# Patient Record
Sex: Female | Born: 1990 | Race: White | Hispanic: No | Marital: Single | Smoking: Current every day smoker
Health system: Southern US, Community
[De-identification: ages and names within clinical notes are randomized; demographics above are authoritative.]

## PROBLEM LIST (undated history)

## (undated) ENCOUNTER — Inpatient Hospital Stay (HOSPITAL_COMMUNITY): Payer: Self-pay

## (undated) ENCOUNTER — Encounter

## (undated) ENCOUNTER — Ambulatory Visit: Payer: MEDICAID

## (undated) ENCOUNTER — Telehealth

## (undated) ENCOUNTER — Ambulatory Visit

## (undated) ENCOUNTER — Ambulatory Visit: Payer: MEDICAID | Attending: Hematology | Primary: Hematology

## (undated) ENCOUNTER — Encounter: Attending: Hematology | Primary: Hematology

## (undated) ENCOUNTER — Encounter: Attending: Gastroenterology | Primary: Gastroenterology

## (undated) DIAGNOSIS — Z91199 Patient's noncompliance with other medical treatment and regimen due to unspecified reason: Secondary | ICD-10-CM

## (undated) DIAGNOSIS — K6389 Other specified diseases of intestine: Secondary | ICD-10-CM

## (undated) DIAGNOSIS — N3010 Interstitial cystitis (chronic) without hematuria: Secondary | ICD-10-CM

## (undated) DIAGNOSIS — F329 Major depressive disorder, single episode, unspecified: Secondary | ICD-10-CM

## (undated) DIAGNOSIS — D6859 Other primary thrombophilia: Secondary | ICD-10-CM

## (undated) DIAGNOSIS — G08 Intracranial and intraspinal phlebitis and thrombophlebitis: Secondary | ICD-10-CM

## (undated) DIAGNOSIS — I639 Cerebral infarction, unspecified: Secondary | ICD-10-CM

## (undated) DIAGNOSIS — O1490 Unspecified pre-eclampsia, unspecified trimester: Secondary | ICD-10-CM

## (undated) DIAGNOSIS — R569 Unspecified convulsions: Secondary | ICD-10-CM

## (undated) DIAGNOSIS — F1120 Opioid dependence, uncomplicated: Secondary | ICD-10-CM

## (undated) DIAGNOSIS — I2699 Other pulmonary embolism without acute cor pulmonale: Secondary | ICD-10-CM

## (undated) DIAGNOSIS — J45909 Unspecified asthma, uncomplicated: Secondary | ICD-10-CM

## (undated) DIAGNOSIS — O2301 Infections of kidney in pregnancy, first trimester: Secondary | ICD-10-CM

## (undated) DIAGNOSIS — G8929 Other chronic pain: Secondary | ICD-10-CM

## (undated) DIAGNOSIS — O039 Complete or unspecified spontaneous abortion without complication: Secondary | ICD-10-CM

## (undated) DIAGNOSIS — R200 Anesthesia of skin: Secondary | ICD-10-CM

## (undated) DIAGNOSIS — D649 Anemia, unspecified: Secondary | ICD-10-CM

## (undated) DIAGNOSIS — R102 Pelvic and perineal pain: Secondary | ICD-10-CM

## (undated) DIAGNOSIS — J189 Pneumonia, unspecified organism: Secondary | ICD-10-CM

## (undated) DIAGNOSIS — R51 Headache: Secondary | ICD-10-CM

## (undated) DIAGNOSIS — I81 Portal vein thrombosis: Secondary | ICD-10-CM

## (undated) DIAGNOSIS — Z9119 Patient's noncompliance with other medical treatment and regimen: Secondary | ICD-10-CM

## (undated) HISTORY — DX: Unspecified convulsions: R56.9

## (undated) HISTORY — DX: Cerebral infarction, unspecified: I63.9

## (undated) HISTORY — DX: Anesthesia of skin: R20.0

## (undated) HISTORY — PX: ABDOMINAL SURGERY: SHX537

## (undated) HISTORY — PX: VAGINA SURGERY: SHX829

---
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
## 2010-03-11 DIAGNOSIS — O1490 Unspecified pre-eclampsia, unspecified trimester: Secondary | ICD-10-CM

## 2010-03-11 HISTORY — DX: Unspecified pre-eclampsia, unspecified trimester: O14.90

## 2010-07-25 ENCOUNTER — Other Ambulatory Visit (HOSPITAL_COMMUNITY): Payer: Self-pay | Admitting: Obstetrics and Gynecology

## 2010-07-25 DIAGNOSIS — IMO0002 Reserved for concepts with insufficient information to code with codable children: Secondary | ICD-10-CM

## 2010-07-25 DIAGNOSIS — O2690 Pregnancy related conditions, unspecified, unspecified trimester: Secondary | ICD-10-CM

## 2010-07-25 DIAGNOSIS — Z0489 Encounter for examination and observation for other specified reasons: Secondary | ICD-10-CM

## 2010-07-27 NOTE — Discharge Summary (Signed)
NAME:  Victoria Alvarez, BUNYAN NO.:  [id number]   MEDICAL RECORD NO.:  [id number]          PATIENT TYPE:  OBV   LOCATION:  5615                         FACILITY:  [facility]   PHYSICIAN:  Garen Lah, MDDATE OF BIRTH:  [birth year]   DATE OF ADMISSION:  11/06/2005  DATE OF DISCHARGE:  11/08/2005                                 DISCHARGE SUMMARY   REASON FOR HOSPITALIZATION:  Abdominal pain.   SIGNIFICANT FINDINGS DURING HOSPITALIZATION:  A 20 year old sexually active  white female with 1-2 year history of intermittent lower left abdominal pain  that was associated with her menstrual cycle.  This left lower abdominal  pain became acutely worse in the last 2 weeks.  The patient was admitted for  questionable PID versus endometriosis.  On pelvic exam, she had positive  cervical motion tenderness and bilateral adnexal tenderness and also slight  yellow cervical drainage.  UA showed moderate leukocyte esterase and 3-5  white blood cells.  Gonorrhea and Chlamydia tests were negative.  RPR was  nonreactive.  HIV test is pending.  Pelvic ultrasound showed slight free  fluid, but was read as being normal, and the fluid could have been just  normal physiological variation.  The patient had recently gone to an outside  hospital emergency room and was treated with Septra for a UTI.   TREATMENT DURING HOSPITALIZATION:  1. Doxycycline 100 mg IV every 12.  2. Cefoxitin 2 g IV every 6 hours.  Both number 1 and 2 were administered      for more than 24 hours for the suspected PID.  3. Toradol and morphine originally for pain medication on admission, which      was changed to Naprosyn and Zantac.   OPERATIONS AND PROCEDURES:  None.   FINAL DIAGNOSIS:  Possible endometriosis.   DISCHARGE MEDICATIONS AND INSTRUCTIONS:  1. Naprosyn 250 mg p.o. every 8 x 1 week.  2. Continue Loestrin 24 until patient can follow up with OB/GYN for      possible increase of estrogen.  The patient was  instructed to take      birth control continuously without a break in the placebo pills for the      one week in order to help with the questionable endometriosis.   PENDING RESULTS/ISSUES TO BE FOLLOWED:  HIV test.   FOLLOWUP:  1. The patient has a gynecological appointment on September 10th at 9:30.  2. The patient also has follow up with a psychologist, Dr. Sandie Ano on      September 7th at 1:30.   DISCHARGE WEIGHT:  68 kg.   DISCHARGE CONDITION:  Good.   Fax to primary care physicians at [facility].  Fax number is  [phone].   DICTATED BY:  [employer]           ______________________________  Garen Lah, MD     LSP/MEDQ  D:  11/08/2005  T:  11/08/2005  Job:  [id number]

## 2010-08-02 ENCOUNTER — Other Ambulatory Visit (HOSPITAL_COMMUNITY): Payer: Self-pay | Admitting: Obstetrics and Gynecology

## 2010-08-02 ENCOUNTER — Ambulatory Visit (HOSPITAL_COMMUNITY)
Admission: RE | Admit: 2010-08-02 | Discharge: 2010-08-02 | Disposition: A | Discharge status: Home / Self Care | Payer: Medicaid Other | Source: Ambulatory Visit | Attending: Obstetrics and Gynecology | Admitting: Obstetrics and Gynecology

## 2010-08-02 DIAGNOSIS — O3500X0 Maternal care for (suspected) central nervous system malformation or damage in fetus, unspecified, not applicable or unspecified: Secondary | ICD-10-CM | POA: Insufficient documentation

## 2010-08-02 DIAGNOSIS — IMO0002 Reserved for concepts with insufficient information to code with codable children: Secondary | ICD-10-CM

## 2010-08-02 DIAGNOSIS — O4100X0 Oligohydramnios, unspecified trimester, not applicable or unspecified: Secondary | ICD-10-CM | POA: Insufficient documentation

## 2010-08-02 DIAGNOSIS — Z0489 Encounter for examination and observation for other specified reasons: Secondary | ICD-10-CM

## 2010-08-02 DIAGNOSIS — Z1389 Encounter for screening for other disorder: Secondary | ICD-10-CM | POA: Insufficient documentation

## 2010-08-02 DIAGNOSIS — Z363 Encounter for antenatal screening for malformations: Secondary | ICD-10-CM | POA: Insufficient documentation

## 2010-08-02 DIAGNOSIS — O2690 Pregnancy related conditions, unspecified, unspecified trimester: Secondary | ICD-10-CM

## 2010-08-02 DIAGNOSIS — O350XX0 Maternal care for (suspected) central nervous system malformation in fetus, not applicable or unspecified: Secondary | ICD-10-CM

## 2010-08-02 DIAGNOSIS — O358XX0 Maternal care for other (suspected) fetal abnormality and damage, not applicable or unspecified: Secondary | ICD-10-CM | POA: Insufficient documentation

## 2010-08-02 DIAGNOSIS — O99340 Other mental disorders complicating pregnancy, unspecified trimester: Secondary | ICD-10-CM

## 2010-08-16 ENCOUNTER — Ambulatory Visit (HOSPITAL_COMMUNITY)
Admission: RE | Admit: 2010-08-16 | Discharge: 2010-08-16 | Disposition: A | Discharge status: Home / Self Care | Payer: Medicaid Other | Source: Ambulatory Visit | Attending: Obstetrics and Gynecology | Admitting: Obstetrics and Gynecology

## 2010-08-16 DIAGNOSIS — O4100X0 Oligohydramnios, unspecified trimester, not applicable or unspecified: Secondary | ICD-10-CM | POA: Insufficient documentation

## 2010-08-16 DIAGNOSIS — Z3689 Encounter for other specified antenatal screening: Secondary | ICD-10-CM | POA: Insufficient documentation

## 2010-08-16 DIAGNOSIS — O3500X0 Maternal care for (suspected) central nervous system malformation or damage in fetus, unspecified, not applicable or unspecified: Secondary | ICD-10-CM | POA: Insufficient documentation

## 2010-08-16 DIAGNOSIS — O350XX0 Maternal care for (suspected) central nervous system malformation in fetus, not applicable or unspecified: Secondary | ICD-10-CM

## 2010-08-16 DIAGNOSIS — O99340 Other mental disorders complicating pregnancy, unspecified trimester: Secondary | ICD-10-CM

## 2010-12-18 LAB — CBC
HCT: 41.7
Hemoglobin: 14.3
MCHC: 34.3
MCV: 95.4
Platelets: 218
RBC: 4.37
RDW: 11.9
WBC: 6.4

## 2010-12-18 LAB — DIFFERENTIAL
Basophils Absolute: 0.1
Basophils Relative: 2 — ABNORMAL HIGH
Eosinophils Absolute: 0.1 — ABNORMAL LOW
Eosinophils Relative: 1
Lymphocytes Relative: 36
Lymphs Abs: 2.3
Monocytes Absolute: 0.5
Monocytes Relative: 7
Neutro Abs: 3.4
Neutrophils Relative %: 54

## 2010-12-18 LAB — URINALYSIS, ROUTINE W REFLEX MICROSCOPIC
Bilirubin Urine: NEGATIVE
Glucose, UA: NEGATIVE
Ketones, ur: NEGATIVE
Nitrite: NEGATIVE
Protein, ur: NEGATIVE
Specific Gravity, Urine: 1.011
Urobilinogen, UA: 1
pH: 7

## 2010-12-18 LAB — URINE MICROSCOPIC-ADD ON

## 2010-12-18 LAB — WET PREP, GENITAL
Clue Cells Wet Prep HPF POC: NONE SEEN
Trich, Wet Prep: NONE SEEN
Yeast Wet Prep HPF POC: NONE SEEN

## 2010-12-18 LAB — BASIC METABOLIC PANEL
BUN: 8
CO2: 25
Calcium: 8.9
Chloride: 105
Creatinine, Ser: 0.77
Glucose, Bld: 88
Potassium: 3.6
Sodium: 137

## 2010-12-18 LAB — GC/CHLAMYDIA PROBE AMP, GENITAL
Chlamydia, DNA Probe: NEGATIVE
GC Probe Amp, Genital: NEGATIVE

## 2010-12-18 LAB — PREGNANCY, URINE: Preg Test, Ur: NEGATIVE

## 2012-07-10 ENCOUNTER — Emergency Department (HOSPITAL_COMMUNITY)
Admission: EM | Admit: 2012-07-10 | Discharge: 2012-07-10 | Disposition: A | Discharge status: Home / Self Care | Payer: Medicaid Other | Attending: Emergency Medicine | Admitting: Emergency Medicine

## 2012-07-10 ENCOUNTER — Encounter (HOSPITAL_COMMUNITY): Payer: Self-pay | Admitting: Cardiology

## 2012-07-10 DIAGNOSIS — Z3202 Encounter for pregnancy test, result negative: Secondary | ICD-10-CM | POA: Insufficient documentation

## 2012-07-10 DIAGNOSIS — R3915 Urgency of urination: Secondary | ICD-10-CM | POA: Insufficient documentation

## 2012-07-10 DIAGNOSIS — N3010 Interstitial cystitis (chronic) without hematuria: Secondary | ICD-10-CM | POA: Insufficient documentation

## 2012-07-10 DIAGNOSIS — F17200 Nicotine dependence, unspecified, uncomplicated: Secondary | ICD-10-CM | POA: Insufficient documentation

## 2012-07-10 DIAGNOSIS — R300 Dysuria: Secondary | ICD-10-CM | POA: Insufficient documentation

## 2012-07-10 HISTORY — DX: Interstitial cystitis (chronic) without hematuria: N30.10

## 2012-07-10 LAB — URINALYSIS, ROUTINE W REFLEX MICROSCOPIC
Bilirubin Urine: NEGATIVE
Glucose, UA: NEGATIVE mg/dL
Hgb urine dipstick: NEGATIVE
Ketones, ur: NEGATIVE mg/dL
Nitrite: NEGATIVE
Protein, ur: NEGATIVE mg/dL
Specific Gravity, Urine: 1.015 (ref 1.005–1.030)
Urobilinogen, UA: 0.2 mg/dL (ref 0.0–1.0)
pH: 5.5 (ref 5.0–8.0)

## 2012-07-10 LAB — CBC WITH DIFFERENTIAL/PLATELET
Basophils Absolute: 0 10*3/uL (ref 0.0–0.1)
Basophils Relative: 1 % (ref 0–1)
Eosinophils Absolute: 0.4 10*3/uL (ref 0.0–0.7)
Eosinophils Relative: 6 % — ABNORMAL HIGH (ref 0–5)
HCT: 44.3 % (ref 36.0–46.0)
Hemoglobin: 16.9 g/dL — ABNORMAL HIGH (ref 12.0–15.0)
Lymphocytes Relative: 36 % (ref 12–46)
Lymphs Abs: 2.2 10*3/uL (ref 0.7–4.0)
MCH: 33.3 pg (ref 26.0–34.0)
MCHC: 38.1 g/dL — ABNORMAL HIGH (ref 30.0–36.0)
MCV: 87.2 fL (ref 78.0–100.0)
Monocytes Absolute: 0.4 10*3/uL (ref 0.1–1.0)
Monocytes Relative: 6 % (ref 3–12)
Neutro Abs: 3.1 10*3/uL (ref 1.7–7.7)
Neutrophils Relative %: 51 % (ref 43–77)
Platelets: 225 10*3/uL (ref 150–400)
RBC: 5.08 MIL/uL (ref 3.87–5.11)
RDW: 11.5 % (ref 11.5–15.5)
WBC: 6.2 10*3/uL (ref 4.0–10.5)

## 2012-07-10 LAB — COMPREHENSIVE METABOLIC PANEL
ALT: 16 U/L (ref 0–35)
AST: 16 U/L (ref 0–37)
Albumin: 4.2 g/dL (ref 3.5–5.2)
Alkaline Phosphatase: 54 U/L (ref 39–117)
BUN: 3 mg/dL — ABNORMAL LOW (ref 6–23)
CO2: 19 mEq/L (ref 19–32)
Calcium: 9.2 mg/dL (ref 8.4–10.5)
Chloride: 107 mEq/L (ref 96–112)
Creatinine, Ser: 0.73 mg/dL (ref 0.50–1.10)
GFR calc Af Amer: >90 mL/min (ref >90)
GFR calc non Af Amer: >90 mL/min (ref >90)
Glucose, Bld: 97 mg/dL (ref 70–99)
Potassium: 3.6 mEq/L (ref 3.5–5.1)
Sodium: 138 mEq/L (ref 135–145)
Total Bilirubin: 1.3 mg/dL — ABNORMAL HIGH (ref 0.3–1.2)
Total Protein: 6.8 g/dL (ref 6.0–8.3)

## 2012-07-10 LAB — URINE MICROSCOPIC-ADD ON

## 2012-07-10 LAB — POCT PREGNANCY, URINE: Preg Test, Ur: NEGATIVE

## 2012-07-10 LAB — LIPASE, BLOOD: Lipase: 31 U/L (ref 11–59)

## 2012-07-10 MED ORDER — HYDROMORPHONE HCL PF 2 MG/ML IJ SOLN
2.0000 mg | Freq: Once | INTRAMUSCULAR | Status: AC
  Administered 2012-07-10: 2 mg via INTRAMUSCULAR
  Filled 2012-07-10: qty 1

## 2012-07-10 MED ORDER — ONDANSETRON 4 MG PO TBDP
8.0000 mg | ORAL_TABLET | Freq: Once | ORAL | Status: AC
Start: 2012-07-10 — End: 2012-07-10
  Administered 2012-07-10: 8 mg via ORAL
  Filled 2012-07-10: qty 2

## 2012-07-10 MED ORDER — OXYCODONE-ACETAMINOPHEN 5-325 MG PO TABS: 2.0000 | ORAL_TABLET | ORAL | Status: DC | PRN

## 2012-07-10 NOTE — ED Notes (Signed)
Pt comfortable with d/c and f/u instructions. Prescriptions x1 

## 2012-07-10 NOTE — ED Provider Notes (Signed)
History     CSN: [id number]  Arrival date & time 07/10/12  1713   First MD Initiated Contact with Patient 07/10/12 1747      Chief Complaint  Patient presents with  . Abdominal Pain  . Dysuria    (Consider location/radiation/quality/duration/timing/severity/associated sxs/prior treatment) HPI Comments: Patient presents with lower abdominal pain. She has a history of interstitial cystitis and has had similar pain in the past with her interstitial cystitis. She has crampy pain in her lower abdomen radiating to her left side. She has burning on urination and feels like she's "peeing razor blades".  She denies any fevers or chills. She denies any nausea vomiting or diarrhea. She was previously seeing a urologist in [place] but has successfully stopped seeing him. She's been taking ibuprofen at home without relief  Patient is a 22 y.o. female presenting with abdominal pain and dysuria.  Abdominal Pain Associated symptoms: dysuria   Associated symptoms: no chest pain, no chills, no cough, no diarrhea, no fatigue, no fever, no hematuria, no nausea, no shortness of breath and no vomiting   Dysuria  Associated symptoms include urgency. Pertinent negatives include no chills, no nausea, no vomiting, no frequency, no hematuria and no flank pain.    Past Medical History  Diagnosis Date  . Interstitial cystitis     History reviewed. No pertinent past surgical history.  History reviewed. No pertinent family history.  History  Substance Use Topics  . Smoking status: Current Every Day Smoker  . Smokeless tobacco: Not on file  . Alcohol Use: No    OB History   Grav Para Term Preterm Abortions TAB SAB Ect Mult Living                  Review of Systems  Constitutional: Negative for fever, chills, diaphoresis and fatigue.  HENT: Negative for congestion, rhinorrhea and sneezing.   Eyes: Negative.   Respiratory: Negative for cough, chest tightness and shortness of breath.    Cardiovascular: Negative for chest pain and leg swelling.  Gastrointestinal: Positive for abdominal pain. Negative for nausea, vomiting, diarrhea and blood in stool.  Genitourinary: Positive for dysuria and urgency. Negative for frequency, hematuria, flank pain and difficulty urinating.  Musculoskeletal: Negative for back pain and arthralgias.  Skin: Negative for rash.  Neurological: Negative for dizziness, speech difficulty, weakness, numbness and headaches.    Allergies  Ciprofloxacin  Home Medications   Current Outpatient Rx  Name  Route  Sig  Dispense  Refill  . diphenhydrAMINE (BENADRYL) 25 MG tablet   Oral   Take 50 mg by mouth at bedtime as needed for itching or allergies.         [name] oxyCODONE-acetaminophen (PERCOCET) 5-325 MG per tablet   Oral   Take 2 tablets by mouth every 4 (four) hours as needed for pain.   15 tablet   0     BP 117/81  Pulse 96  Temp(Src) 97.9 F (36.6 C) (Oral)  Resp 18  SpO2 100%  Physical Exam  Constitutional: She is oriented to person, place, and time. She appears well-developed and well-nourished.  HENT:  Head: Normocephalic and atraumatic.  Eyes: Pupils are equal, round, and reactive to light.  Neck: Normal range of motion. Neck supple.  Cardiovascular: Normal rate, regular rhythm and normal heart sounds.   Pulmonary/Chest: Effort normal and breath sounds normal. No respiratory distress. She has no wheezes. She has no rales. She exhibits no tenderness.  Abdominal: Soft. Bowel sounds are normal. There is  tenderness (Moderate tenderness to suprapubic and left lower quadrant area). There is no rebound and no guarding.  Musculoskeletal: Normal range of motion. She exhibits no edema.  Lymphadenopathy:    She has no cervical adenopathy.  Neurological: She is alert and oriented to person, place, and time.  Skin: Skin is warm and dry. No rash noted.  Psychiatric: She has a normal mood and affect.    ED Course  Procedures (including  critical care time)  Results for orders placed during the hospital encounter of 07/10/12  CBC WITH DIFFERENTIAL      Result Value Range   WBC 6.2  4.0 - 10.5 K/uL   RBC 5.08  3.87 - 5.11 MIL/uL   Hemoglobin 16.9 (*) 12.0 - 15.0 g/dL   HCT 36.6  44.0 - 34.7 %   MCV 87.2  78.0 - 100.0 fL   MCH 33.3  26.0 - 34.0 pg   MCHC 38.1 (*) 30.0 - 36.0 g/dL   RDW 42.5  95.6 - 38.7 %   Platelets 225  150 - 400 K/uL   Neutrophils Relative 51  43 - 77 %   Neutro Abs 3.1  1.7 - 7.7 K/uL   Lymphocytes Relative 36  12 - 46 %   Lymphs Abs 2.2  0.7 - 4.0 K/uL   Monocytes Relative 6  3 - 12 %   Monocytes Absolute 0.4  0.1 - 1.0 K/uL   Eosinophils Relative 6 (*) 0 - 5 %   Eosinophils Absolute 0.4  0.0 - 0.7 K/uL   Basophils Relative 1  0 - 1 %   Basophils Absolute 0.0  0.0 - 0.1 K/uL  COMPREHENSIVE METABOLIC PANEL      Result Value Range   Sodium 138  135 - 145 mEq/L   Potassium 3.6  3.5 - 5.1 mEq/L   Chloride 107  96 - 112 mEq/L   CO2 19  19 - 32 mEq/L   Glucose, Bld 97  70 - 99 mg/dL   BUN 3 (*) 6 - 23 mg/dL   Creatinine, Ser 5.64  0.50 - 1.10 mg/dL   Calcium 9.2  8.4 - 33.2 mg/dL   Total Protein 6.8  6.0 - 8.3 g/dL   Albumin 4.2  3.5 - 5.2 g/dL   AST 16  0 - 37 U/L   ALT 16  0 - 35 U/L   Alkaline Phosphatase 54  39 - 117 U/L   Total Bilirubin 1.3 (*) 0.3 - 1.2 mg/dL   GFR calc non Af Amer >90  >90 mL/min   GFR calc Af Amer >90  >90 mL/min  LIPASE, BLOOD      Result Value Range   Lipase 31  11 - 59 U/L  URINALYSIS, ROUTINE W REFLEX MICROSCOPIC      Result Value Range   Color, Urine YELLOW  YELLOW   APPearance HAZY (*) CLEAR   Specific Gravity, Urine 1.015  1.005 - 1.030   pH 5.5  5.0 - 8.0   Glucose, UA NEGATIVE  NEGATIVE mg/dL   Hgb urine dipstick NEGATIVE  NEGATIVE   Bilirubin Urine NEGATIVE  NEGATIVE   Ketones, ur NEGATIVE  NEGATIVE mg/dL   Protein, ur NEGATIVE  NEGATIVE mg/dL   Urobilinogen, UA 0.2  0.0 - 1.0 mg/dL   Nitrite NEGATIVE  NEGATIVE   Leukocytes, UA SMALL (*)  NEGATIVE  URINE MICROSCOPIC-ADD ON      Result Value Range   Squamous Epithelial / LPF FEW (*) RARE   WBC,  UA 3-6  <3 WBC/hpf   RBC / HPF 0-2  <3 RBC/hpf   Bacteria, UA FEW (*) RARE  POCT PREGNANCY, URINE      Result Value Range   Preg Test, Ur NEGATIVE  NEGATIVE   No results found.    1. Interstitial cystitis       MDM  Patient symptoms are consistent with her past episodes of interstitial cystitis. Her urine does not appear to be infected however it was sent for culture. She was given pain medicine and was encouraged to followup with a urologist.        Rolan Bucco, MD 07/10/12 2249

## 2012-07-10 NOTE — ED Notes (Signed)
Dr. Tamera Punt at bedside

## 2012-07-10 NOTE — ED Notes (Signed)
Pt reports she has hx of interstitial cystitis and started having pain last night. States she feel like she is "Visual merchandiser blades". States she takes home medication but is out at this time. Denies any fever or vaginal discharge.

## 2012-07-12 LAB — URINE CULTURE: Colony Count: 4000

## 2012-07-15 ENCOUNTER — Emergency Department (HOSPITAL_COMMUNITY): Payer: Medicaid Other

## 2012-07-15 ENCOUNTER — Emergency Department (HOSPITAL_COMMUNITY)
Admission: EM | Admit: 2012-07-15 | Discharge: 2012-07-15 | Disposition: A | Discharge status: Home / Self Care | Payer: Medicaid Other | Attending: Emergency Medicine | Admitting: Emergency Medicine

## 2012-07-15 ENCOUNTER — Encounter (HOSPITAL_COMMUNITY): Payer: Self-pay | Admitting: *Deleted

## 2012-07-15 DIAGNOSIS — N3090 Cystitis, unspecified without hematuria: Secondary | ICD-10-CM | POA: Insufficient documentation

## 2012-07-15 DIAGNOSIS — N3010 Interstitial cystitis (chronic) without hematuria: Secondary | ICD-10-CM

## 2012-07-15 DIAGNOSIS — F17200 Nicotine dependence, unspecified, uncomplicated: Secondary | ICD-10-CM | POA: Insufficient documentation

## 2012-07-15 DIAGNOSIS — R102 Pelvic and perineal pain: Secondary | ICD-10-CM

## 2012-07-15 DIAGNOSIS — Z3202 Encounter for pregnancy test, result negative: Secondary | ICD-10-CM | POA: Insufficient documentation

## 2012-07-15 DIAGNOSIS — R109 Unspecified abdominal pain: Secondary | ICD-10-CM | POA: Insufficient documentation

## 2012-07-15 DIAGNOSIS — N949 Unspecified condition associated with female genital organs and menstrual cycle: Secondary | ICD-10-CM | POA: Insufficient documentation

## 2012-07-15 LAB — URINALYSIS, ROUTINE W REFLEX MICROSCOPIC
Bilirubin Urine: NEGATIVE
Glucose, UA: NEGATIVE mg/dL
Ketones, ur: NEGATIVE mg/dL
Nitrite: NEGATIVE
Protein, ur: 30 mg/dL — AB
Specific Gravity, Urine: 1.009 (ref 1.005–1.030)
Urobilinogen, UA: 0.2 mg/dL (ref 0.0–1.0)
pH: 7.5 (ref 5.0–8.0)

## 2012-07-15 LAB — WET PREP, GENITAL
Clue Cells Wet Prep HPF POC: NONE SEEN
Trich, Wet Prep: NONE SEEN
Yeast Wet Prep HPF POC: NONE SEEN

## 2012-07-15 LAB — URINE MICROSCOPIC-ADD ON

## 2012-07-15 LAB — POCT PREGNANCY, URINE: Preg Test, Ur: NEGATIVE

## 2012-07-15 MED ORDER — OXYCODONE-ACETAMINOPHEN 5-325 MG PO TABS: 1.0000 | ORAL_TABLET | Freq: Four times a day (QID) | ORAL | Status: DC | PRN

## 2012-07-15 MED ORDER — ONDANSETRON HCL 4 MG/2ML IJ SOLN
4.0000 mg | Freq: Once | INTRAMUSCULAR | Status: AC
  Administered 2012-07-15: 4 mg via INTRAVENOUS
  Filled 2012-07-15: qty 2

## 2012-07-15 MED ORDER — OXYCODONE-ACETAMINOPHEN 5-325 MG PO TABS
2.0000 | ORAL_TABLET | Freq: Once | ORAL | Status: AC
  Administered 2012-07-15: 2 via ORAL
  Filled 2012-07-15: qty 2

## 2012-07-15 MED ORDER — MORPHINE SULFATE 4 MG/ML IJ SOLN
4.0000 mg | Freq: Once | INTRAMUSCULAR | Status: AC
  Administered 2012-07-15: 4 mg via INTRAVENOUS
  Filled 2012-07-15: qty 1

## 2012-07-15 NOTE — ED Notes (Signed)
Patient transported to Ultrasound 

## 2012-07-15 NOTE — ED Notes (Signed)
Pt reports starting her period today and having severe abd pain and heavy vaginal bleeding, reports soaking through 4 pads in 4.5 hours.

## 2012-07-15 NOTE — ED Provider Notes (Signed)
History     CSN: [id number]  Arrival date & time 07/15/12  1347   First MD Initiated Contact with Patient 07/15/12 1352      Chief Complaint  Patient presents with  . Vaginal Bleeding  . Abdominal Pain    (Consider location/radiation/quality/duration/timing/severity/associated sxs/prior treatment) HPI Comments: Patient with a history of Interstitial Cystitis presents today with a chief complaint of lower abdominal pain, worse on the left.  She reports that she has some crampy abdominal pain yesterday, but the pain worsened today.  She describes the pain as a sharp contractual pain.  She also reports that she started her menstrual cycle yesterday.  She reports heavier vaginal bleeding than normal.  No blood clots.  She denies dizziness, lightheadedness, or syncope.  She was seen in the ED for similar symptoms on 07/10/12 and was discharge home with pain medication.  Pain was thought to be caused by her interstitial cystitis.  She reports that her pain at this time is similar to the pain that she has with Interstitial Cystitis, but is worse.  She has taken Midol for the pain without relief.  She denies nausea or vomiting.  Denies fever or chills.  Denies dysuria, increased urinary frequency, or urinary urgency.    The history is provided by the patient.    Past Medical History  Diagnosis Date  . Interstitial cystitis     History reviewed. No pertinent past surgical history.  History reviewed. No pertinent family history.  History  Substance Use Topics  . Smoking status: Current Every Day Smoker  . Smokeless tobacco: Not on file  . Alcohol Use: No    OB History   Grav Para Term Preterm Abortions TAB SAB Ect Mult Living                  Review of Systems  Constitutional: Negative for fever and chills.  Gastrointestinal: Positive for abdominal pain.  Genitourinary: Positive for vaginal bleeding and vaginal discharge.  All other systems reviewed and are  negative.    Allergies  Ciprofloxacin  Home Medications   Current Outpatient Rx  Name  Route  Sig  Dispense  Refill  . diphenhydrAMINE (BENADRYL) 25 MG tablet   Oral   Take 50 mg by mouth at bedtime as needed for itching or allergies.         [name] oxyCODONE-acetaminophen (PERCOCET) 5-325 MG per tablet   Oral   Take 2 tablets by mouth every 4 (four) hours as needed for pain.   15 tablet   0     BP 152/86  Pulse 110  Temp(Src) 98.1 F (36.7 C) (Oral)  Resp 18  SpO2 100%  LMP 07/15/2012  Physical Exam  Nursing note and vitals reviewed. Constitutional: She appears well-developed and well-nourished. No distress.  HENT:  Head: Normocephalic and atraumatic.  Mouth/Throat: Oropharynx is clear and moist.  Neck: Normal range of motion. Neck supple.  Cardiovascular: Normal rate, regular rhythm and normal heart sounds.   Pulmonary/Chest: Effort normal and breath sounds normal.  Abdominal: Soft. Bowel sounds are normal. She exhibits no distension and no mass. There is tenderness in the suprapubic area and left lower quadrant. There is no rebound and no guarding.  Genitourinary: Right adnexum displays tenderness. Right adnexum displays no mass and no fullness. Left adnexum displays tenderness. Left adnexum displays no mass and no fullness.  Patient screamed the second that the speculum entered the vaginal vault.  She complained of pain throughout the entire pelvic exam.  Blood visualized in the vaginal vault.  Musculoskeletal: Normal range of motion.  Neurological: She is alert.  Skin: Skin is warm and dry. She is not diaphoretic.  Psychiatric: She has a normal mood and affect.    ED Course  Procedures (including critical care time)  Labs Reviewed  GC/CHLAMYDIA PROBE AMP  WET PREP, GENITAL  URINALYSIS, ROUTINE W REFLEX MICROSCOPIC   US Transvaginal Non-ob  07/15/2012  *RADIOLOGY REPORT*  Clinical Data:  Pelvic pain, abdominal pain, vaginal bleeding  TRANSABDOMINAL AND  TRANSVAGINAL ULTRASOUND OF PELVIS DOPPLER ULTRASOUND OF OVARIES  Technique:  Both transabdominal and transvaginal ultrasound examinations of the pelvis were performed. Transabdominal technique was performed for global imaging of the pelvis including uterus, ovaries, adnexal regions, and pelvic cul-de-sac.  It was necessary to proceed with endovaginal exam following the transabdominal exam to visualize the endometrium and left ovary.  Color and duplex Doppler ultrasound was utilized to evaluate blood flow to the ovaries.  Comparison:  No similar prior study is available for comparison.  Findings:  Uterus:  7.7 x 4.2 x 3.4 cm.  Anteverted, retroflexed.  This renders visualization of the fundus somewhat suboptimal.  No focal abnormality.  Endometrium:  11 mm.  Uniformly echogenic without focal abnormality. Suboptimally visualized at the fundus due to retroflexion.  Right ovary: 2.4 x 2.1 x 1.8 cm.  Normal.  Left ovary:    2.7 x 2.4 x 1.6 cm.  Normal.  Pulsed Doppler evaluation demonstrates normal low-resistance arterial and venous waveforms in both ovaries.  IMPRESSION: Normal exam.  No evidence of pelvic mass or other significant abnormality.  No sonographic evidence for ovarian torsion.   Original Report Authenticated By: Christiana Pellant, M.D.    US Pelvis Complete  07/15/2012  *RADIOLOGY REPORT*  Clinical Data:  Pelvic pain, abdominal pain, vaginal bleeding  TRANSABDOMINAL AND TRANSVAGINAL ULTRASOUND OF PELVIS DOPPLER ULTRASOUND OF OVARIES  Technique:  Both transabdominal and transvaginal ultrasound examinations of the pelvis were performed. Transabdominal technique was performed for global imaging of the pelvis including uterus, ovaries, adnexal regions, and pelvic cul-de-sac.  It was necessary to proceed with endovaginal exam following the transabdominal exam to visualize the endometrium and left ovary.  Color and duplex Doppler ultrasound was utilized to evaluate blood flow to the ovaries.  Comparison:  No  similar prior study is available for comparison.  Findings:  Uterus:  7.7 x 4.2 x 3.4 cm.  Anteverted, retroflexed.  This renders visualization of the fundus somewhat suboptimal.  No focal abnormality.  Endometrium:  11 mm.  Uniformly echogenic without focal abnormality. Suboptimally visualized at the fundus due to retroflexion.  Right ovary: 2.4 x 2.1 x 1.8 cm.  Normal.  Left ovary:    2.7 x 2.4 x 1.6 cm.  Normal.  Pulsed Doppler evaluation demonstrates normal low-resistance arterial and venous waveforms in both ovaries.  IMPRESSION: Normal exam.  No evidence of pelvic mass or other significant abnormality.  No sonographic evidence for ovarian torsion.   Original Report Authenticated By: Christiana Pellant, M.D.    [place] Art/ven Flow Abd Pelv Doppler  07/15/2012  *RADIOLOGY REPORT*  Clinical Data:  Pelvic pain, abdominal pain, vaginal bleeding  TRANSABDOMINAL AND TRANSVAGINAL ULTRASOUND OF PELVIS DOPPLER ULTRASOUND OF OVARIES  Technique:  Both transabdominal and transvaginal ultrasound examinations of the pelvis were performed. Transabdominal technique was performed for global imaging of the pelvis including uterus, ovaries, adnexal regions, and pelvic cul-de-sac.  It was necessary to proceed with endovaginal exam following the transabdominal exam to visualize the endometrium  and left ovary.  Color and duplex Doppler ultrasound was utilized to evaluate blood flow to the ovaries.  Comparison:  No similar prior study is available for comparison.  Findings:  Uterus:  7.7 x 4.2 x 3.4 cm.  Anteverted, retroflexed.  This renders visualization of the fundus somewhat suboptimal.  No focal abnormality.  Endometrium:  11 mm.  Uniformly echogenic without focal abnormality. Suboptimally visualized at the fundus due to retroflexion.  Right ovary: 2.4 x 2.1 x 1.8 cm.  Normal.  Left ovary:    2.7 x 2.4 x 1.6 cm.  Normal.  Pulsed Doppler evaluation demonstrates normal low-resistance arterial and venous waveforms in both ovaries.   IMPRESSION: Normal exam.  No evidence of pelvic mass or other significant abnormality.  No sonographic evidence for ovarian torsion.   Original Report Authenticated By: Christiana Pellant, M.D.      No diagnosis found.  3:17 PM Patient reports that her pain has improved at this time.  MDM  Patient with a history of Interstitial Cystitis presents with a chief complaint of vaginal bleeding and pelvic pain.  Vital signs WNL.  She denies any dizziness, lightheadedness, or syncope.  UA negative for infection.  Urine pregnancy negative.  Wet prep negative.  GC/Chlamydia pending.  No acute findings on pelvic ultrasound.  Pain improved while in the ED.  Patient discharged home.  Return precautions given.        Pascal Lux [place], PA-C 07/15/12 1751  Pascal Lux [place], PA-C 07/15/12 1752

## 2012-07-16 LAB — GC/CHLAMYDIA PROBE AMP
CT Probe RNA: NEGATIVE
GC Probe RNA: NEGATIVE

## 2012-07-16 NOTE — ED Provider Notes (Signed)
Medical screening examination/treatment/procedure(s) were performed by non-physician practitioner and as supervising physician I was immediately available for consultation/collaboration.   Delora Fuel, MD [phone]/[id number]/[id number]

## 2013-03-07 ENCOUNTER — Emergency Department (HOSPITAL_COMMUNITY): Payer: Medicaid Other

## 2013-03-07 ENCOUNTER — Encounter (HOSPITAL_COMMUNITY): Payer: Self-pay | Admitting: Emergency Medicine

## 2013-03-07 ENCOUNTER — Emergency Department (HOSPITAL_COMMUNITY)
Admission: EM | Admit: 2013-03-07 | Discharge: 2013-03-07 | Disposition: A | Discharge status: Home / Self Care | Payer: Medicaid Other | Attending: Emergency Medicine | Admitting: Emergency Medicine

## 2013-03-07 DIAGNOSIS — F17200 Nicotine dependence, unspecified, uncomplicated: Secondary | ICD-10-CM | POA: Insufficient documentation

## 2013-03-07 DIAGNOSIS — Z3202 Encounter for pregnancy test, result negative: Secondary | ICD-10-CM | POA: Insufficient documentation

## 2013-03-07 DIAGNOSIS — M545 Low back pain, unspecified: Secondary | ICD-10-CM | POA: Insufficient documentation

## 2013-03-07 DIAGNOSIS — Z8659 Personal history of other mental and behavioral disorders: Secondary | ICD-10-CM | POA: Insufficient documentation

## 2013-03-07 DIAGNOSIS — Z87448 Personal history of other diseases of urinary system: Secondary | ICD-10-CM | POA: Insufficient documentation

## 2013-03-07 DIAGNOSIS — R34 Anuria and oliguria: Secondary | ICD-10-CM | POA: Insufficient documentation

## 2013-03-07 DIAGNOSIS — Z8742 Personal history of other diseases of the female genital tract: Secondary | ICD-10-CM | POA: Insufficient documentation

## 2013-03-07 DIAGNOSIS — R109 Unspecified abdominal pain: Secondary | ICD-10-CM | POA: Insufficient documentation

## 2013-03-07 DIAGNOSIS — R0602 Shortness of breath: Secondary | ICD-10-CM | POA: Insufficient documentation

## 2013-03-07 HISTORY — DX: Major depressive disorder, single episode, unspecified: F32.9

## 2013-03-07 HISTORY — DX: Other chronic pain: G89.29

## 2013-03-07 HISTORY — DX: Pelvic and perineal pain: R10.2

## 2013-03-07 LAB — D-DIMER, QUANTITATIVE (NOT AT ARMC): D-Dimer, Quant: <0.27 ug/mL-FEU (ref 0.00–0.48)

## 2013-03-07 LAB — CBC
HCT: 49.8 % — ABNORMAL HIGH (ref 36.0–46.0)
Hemoglobin: 18.1 g/dL — ABNORMAL HIGH (ref 12.0–15.0)
MCH: 34.5 pg — ABNORMAL HIGH (ref 26.0–34.0)
MCHC: 36.3 g/dL — ABNORMAL HIGH (ref 30.0–36.0)
MCV: 95 fL (ref 78.0–100.0)
Platelets: 221 10*3/uL (ref 150–400)
RBC: 5.24 MIL/uL — ABNORMAL HIGH (ref 3.87–5.11)
RDW: 11.9 % (ref 11.5–15.5)
WBC: 5.9 10*3/uL (ref 4.0–10.5)

## 2013-03-07 LAB — POCT PREGNANCY, URINE: Preg Test, Ur: NEGATIVE

## 2013-03-07 LAB — BASIC METABOLIC PANEL
BUN: 5 mg/dL — ABNORMAL LOW (ref 6–23)
CO2: 23 mEq/L (ref 19–32)
Calcium: 9.6 mg/dL (ref 8.4–10.5)
Chloride: 104 mEq/L (ref 96–112)
Creatinine, Ser: 0.8 mg/dL (ref 0.50–1.10)
GFR calc Af Amer: >90 mL/min (ref >90)
GFR calc non Af Amer: >90 mL/min (ref >90)
Glucose, Bld: 82 mg/dL (ref 70–99)
Potassium: 3.6 mEq/L (ref 3.5–5.1)
Sodium: 139 mEq/L (ref 135–145)

## 2013-03-07 LAB — URINALYSIS, ROUTINE W REFLEX MICROSCOPIC
Bilirubin Urine: NEGATIVE
Glucose, UA: NEGATIVE mg/dL
Hgb urine dipstick: NEGATIVE
Ketones, ur: NEGATIVE mg/dL
Leukocytes, UA: NEGATIVE
Nitrite: NEGATIVE
Protein, ur: NEGATIVE mg/dL
Specific Gravity, Urine: 1.008 (ref 1.005–1.030)
Urobilinogen, UA: 0.2 mg/dL (ref 0.0–1.0)
pH: 5.5 (ref 5.0–8.0)

## 2013-03-07 LAB — POCT I-STAT TROPONIN I: Troponin i, poc: 0 ng/mL (ref 0.00–0.08)

## 2013-03-07 MED ORDER — OXYCODONE-ACETAMINOPHEN 5-325 MG PO TABS: ORAL_TABLET | ORAL | Status: DC

## 2013-03-07 MED ORDER — SODIUM CHLORIDE 0.9 % IV BOLUS (SEPSIS)
1000.0000 mL | Freq: Once | INTRAVENOUS | Status: AC
  Administered 2013-03-07: 1000 mL via INTRAVENOUS

## 2013-03-07 MED ORDER — NAPROXEN 250 MG PO TABS: 250.0000 mg | ORAL_TABLET | Freq: Two times a day (BID) | ORAL | Status: DC

## 2013-03-07 MED ORDER — METHOCARBAMOL 500 MG PO TABS: 1000.0000 mg | ORAL_TABLET | Freq: Four times a day (QID) | ORAL | Status: DC | PRN

## 2013-03-07 MED ORDER — HYDROMORPHONE HCL PF 1 MG/ML IJ SOLN
2.0000 mg | Freq: Once | INTRAMUSCULAR | Status: AC
  Administered 2013-03-07: 2 mg via INTRAMUSCULAR
  Filled 2013-03-07: qty 2

## 2013-03-07 MED ORDER — OXYCODONE-ACETAMINOPHEN 5-325 MG PO TABS
2.0000 | ORAL_TABLET | Freq: Once | ORAL | Status: AC
  Administered 2013-03-07: 2 via ORAL
  Filled 2013-03-07: qty 2

## 2013-03-07 NOTE — ED Provider Notes (Signed)
CSN: [id number]     Arrival date & time 03/07/13  1221 History   First MD Initiated Contact with Patient 03/07/13 1313     Chief Complaint  Patient presents with  . Flank Pain  . Shortness of Breath  . Chest Pain    HPI Pt was seen at 1315. Per pt, c/o gradual onset and persistence of constant left sided lower back "pain" for the past 2 days. States her lower back pain "makes my chest hurt and I feel SOB." States she feels she is "urinating less" since last night. Denies palpitations, no cough, no abd pain, no N/V/D, no fevers, no rash, no dysuria/hematuria, no vaginal bleeding/discharge.     Past Medical History  Diagnosis Date  . Interstitial cystitis   . Chronic pelvic pain in female   . Depression    History reviewed. No pertinent past surgical history.  History  Substance Use Topics  . Smoking status: Current Every Day Smoker  . Smokeless tobacco: Not on file  . Alcohol Use: No    Review of Systems ROS: Statement: All systems negative except as marked or noted in the HPI; Constitutional: Negative for fever and chills. ; ; Eyes: Negative for eye pain, redness and discharge. ; ; ENMT: Negative for ear pain, hoarseness, nasal congestion, sinus pressure and sore throat. ; ; Cardiovascular: +CP, SOB. Negative for palpitations, diaphoresis, and peripheral edema. ; ; Respiratory: Negative for cough, wheezing and stridor. ; ; Gastrointestinal: Negative for nausea, vomiting, diarrhea, abdominal pain, blood in stool, hematemesis, jaundice and rectal bleeding. . ; ; Genitourinary: +decreased urination. Negative for dysuria, flank pain and hematuria. ; ; Musculoskeletal: +LBP. Negative for neck pain. Negative for swelling and trauma.; ; Skin: Negative for pruritus, rash, abrasions, blisters, bruising and skin lesion.; ; Neuro: Negative for headache, lightheadedness and neck stiffness. Negative for weakness, altered level of consciousness , altered mental status, extremity weakness,  paresthesias, involuntary movement, seizure and syncope.      Allergies  Ciprofloxacin  Home Medications  No current outpatient prescriptions on file. BP 120/84  Pulse 64  Temp(Src) 98 F (36.7 C) (Oral)  Resp 20  SpO2 100%  LMP 02/06/2013 Physical Exam 1320: Physical examination:  Nursing notes reviewed; Vital signs and O2 SAT reviewed;  Constitutional: Well developed, Well nourished, Well hydrated, Uncomfortable appearing; Head:  Normocephalic, atraumatic; Eyes: EOMI, PERRL, No scleral icterus; ENMT: Mouth and pharynx normal, Mucous membranes moist; Neck: Supple, Full range of motion, No lymphadenopathy; Cardiovascular: Regular rate and rhythm, No gallop; Respiratory: Breath sounds clear & equal bilaterally, No rales, rhonchi, wheezes.  Speaking full sentences with ease, Normal respiratory effort/excursion; Chest: Nontender, Movement normal; Abdomen: Soft, Nontender, Nondistended, Normal bowel sounds; Genitourinary: No CVA tenderness; Spine:  No midline CS, TS, LS tenderness. +TTP left lumbar paraspinal muscles;; Extremities: Pulses normal, No tenderness, No edema, No calf edema or asymmetry.; Neuro: AA&Ox3, Major CN grossly intact.  Speech clear. No gross focal motor or sensory deficits in extremities.; Skin: Color normal, Warm, Dry.   ED Course  Procedures      EKG Interpretation    Date/Time:  Sunday March 07 2013 12:36:43 EST Ventricular Rate:  94 PR Interval:  150 QRS Duration: 84 QT Interval:  340 QTC Calculation: 425 R Axis:   99 Text Interpretation:  Normal sinus rhythm Rightward axis Nonspecific ST abnormality Abnormal ECG No old tracing to compare Confirmed by [facility]  MD, Nunzio Cory [phone]) on 03/07/2013 1:23:12 PM  MDM  MDM Reviewed: previous chart, nursing note and vitals Interpretation: labs and CT scan   Results for orders placed during the hospital encounter of 03/07/13  CBC      Result Value Range   WBC 5.9  4.0 - 10.5 K/uL   RBC  5.24 (*) 3.87 - 5.11 MIL/uL   Hemoglobin 18.1 (*) 12.0 - 15.0 g/dL   HCT 49.8 (*) 36.0 - 46.0 %   MCV 95.0  78.0 - 100.0 fL   MCH 34.5 (*) 26.0 - 34.0 pg   MCHC 36.3 (*) 30.0 - 36.0 g/dL   RDW 11.9  11.5 - 15.5 %   Platelets 221  150 - 400 K/uL  BASIC METABOLIC PANEL      Result Value Range   Sodium 139  135 - 145 mEq/L   Potassium 3.6  3.5 - 5.1 mEq/L   Chloride 104  96 - 112 mEq/L   CO2 23  19 - 32 mEq/L   Glucose, Bld 82  70 - 99 mg/dL   BUN 5 (*) 6 - 23 mg/dL   Creatinine, Ser 0.80  0.50 - 1.10 mg/dL   Calcium 9.6  8.4 - 10.5 mg/dL   GFR calc non Af Amer >90  >90 mL/min   GFR calc Af Amer >90  >90 mL/min  URINALYSIS, ROUTINE W REFLEX MICROSCOPIC      Result Value Range   Color, Urine YELLOW  YELLOW   APPearance CLEAR  CLEAR   Specific Gravity, Urine 1.008  1.005 - 1.030   pH 5.5  5.0 - 8.0   Glucose, UA NEGATIVE  NEGATIVE mg/dL   Hgb urine dipstick NEGATIVE  NEGATIVE   Bilirubin Urine NEGATIVE  NEGATIVE   Ketones, ur NEGATIVE  NEGATIVE mg/dL   Protein, ur NEGATIVE  NEGATIVE mg/dL   Urobilinogen, UA 0.2  0.0 - 1.0 mg/dL   Nitrite NEGATIVE  NEGATIVE   Leukocytes, UA NEGATIVE  NEGATIVE  D-DIMER, QUANTITATIVE      Result Value Range   D-Dimer, Quant <0.27  0.00 - 0.48 ug/mL-FEU  POCT PREGNANCY, URINE      Result Value Range   Preg Test, Ur NEGATIVE  NEGATIVE  POCT I-STAT TROPONIN I      Result Value Range   Troponin i, poc 0.00  0.00 - 0.08 ng/mL   Comment 3            Ct Abdomen Pelvis Wo Contrast 03/07/2013   CLINICAL DATA:  Urinary retention, had a Foley catheter for 1 month, unable to urinate since last night, dysuria, having left flank pain, shortness of breath, mid chest pain  EXAM: CT ABDOMEN AND PELVIS WITHOUT CONTRAST  TECHNIQUE: Multidetector CT imaging of the abdomen and pelvis was performed following the standard protocol without intravenous contrast. Sagittal and coronal MPR images reconstructed from axial data set.  COMPARISON:  07/28/2011  FINDINGS: Mild  bibasilar atelectasis.  No urinary tract calcification, hydronephrosis, or ureteral dilatation.  Bladder decompressed.  Within limits of a nonenhanced exam no focal abnormalities of the liver, spleen, pancreas, kidneys, or adrenal glands.  Normal appearing uterus, adnexae, and appendix.  Scattered pelvic phleboliths.  Stomach and bowel loops unremarkable for technique.  No mass, adenopathy, free fluid or inflammatory process.  No hernia or acute bone lesion.  Question prior left inguinal hernia repair.  IMPRESSION: No acute intra-abdominal or intrapelvic abnormalities.   Electronically Signed   By: Lavonia Dana M.D.   On: 03/07/2013 14:53   Dg Chest 2 View  03/07/2013   CLINICAL DATA:  Chest pain and shortness of breath.  EXAM: CHEST  2 VIEW  COMPARISON:  PA and lateral chest 01/11/2008.  FINDINGS: Lungs are clear. Heart size is normal. No pneumothorax or pleural effusion.  IMPRESSION: No acute disease.   Electronically Signed   By: Inge Rise M.D.   On: 03/07/2013 14:36    1725:  Bladder scan on arrival approx 89m and bladder decompressed on CT scan; doubt retention. IVF given for hemoconcentration. Workup otherwise reassuring. Pt feels better after pain meds and wants to go home now. Will tx symptomatically at this time. Dx and testing d/w pt and family.  Questions answered.  Verb understanding, agreeable to d/c home with outpt f/u.     KAlfonzo Feller DO 03/10/13 [phone]

## 2013-03-07 NOTE — ED Notes (Addendum)
Pt has multiple complaints. Reports hx of urinary retention and had foley cath in place for one month. Unable to urinate since last night and having left flank pain. Pt reports having sob and mid chest pains also. ekg done at triage. Pt appears uncomfortable at triage and requesting a foley.

## 2013-03-07 NOTE — ED Notes (Signed)
The patient unable to give urine specimen at this time. The patient has been advised to use call light for assistance. The RN in charge is aware.

## 2013-07-16 ENCOUNTER — Encounter (HOSPITAL_COMMUNITY): Payer: Self-pay | Admitting: Emergency Medicine

## 2013-07-16 DIAGNOSIS — N730 Acute parametritis and pelvic cellulitis: Secondary | ICD-10-CM | POA: Insufficient documentation

## 2013-07-16 DIAGNOSIS — F17200 Nicotine dependence, unspecified, uncomplicated: Secondary | ICD-10-CM | POA: Insufficient documentation

## 2013-07-16 DIAGNOSIS — Z7982 Long term (current) use of aspirin: Secondary | ICD-10-CM | POA: Insufficient documentation

## 2013-07-16 DIAGNOSIS — Z8659 Personal history of other mental and behavioral disorders: Secondary | ICD-10-CM | POA: Insufficient documentation

## 2013-07-16 DIAGNOSIS — Z791 Long term (current) use of non-steroidal anti-inflammatories (NSAID): Secondary | ICD-10-CM | POA: Insufficient documentation

## 2013-07-16 DIAGNOSIS — Z3202 Encounter for pregnancy test, result negative: Secondary | ICD-10-CM | POA: Insufficient documentation

## 2013-07-16 LAB — CBC WITH DIFFERENTIAL/PLATELET
Basophils Absolute: 0 10*3/uL (ref 0.0–0.1)
Basophils Relative: 0 % (ref 0–1)
Eosinophils Absolute: 0.1 10*3/uL (ref 0.0–0.7)
Eosinophils Relative: 2 % (ref 0–5)
HCT: 44.6 % (ref 36.0–46.0)
Hemoglobin: 15.8 g/dL — ABNORMAL HIGH (ref 12.0–15.0)
Lymphocytes Relative: 47 % — ABNORMAL HIGH (ref 12–46)
Lymphs Abs: 2.6 10*3/uL (ref 0.7–4.0)
MCH: 32.9 pg (ref 26.0–34.0)
MCHC: 35.4 g/dL (ref 30.0–36.0)
MCV: 92.9 fL (ref 78.0–100.0)
Monocytes Absolute: 0.3 10*3/uL (ref 0.1–1.0)
Monocytes Relative: 6 % (ref 3–12)
Neutro Abs: 2.5 10*3/uL (ref 1.7–7.7)
Neutrophils Relative %: 45 % (ref 43–77)
Platelets: 194 10*3/uL (ref 150–400)
RBC: 4.8 MIL/uL (ref 3.87–5.11)
RDW: 11.9 % (ref 11.5–15.5)
WBC: 5.6 10*3/uL (ref 4.0–10.5)

## 2013-07-16 LAB — URINALYSIS, ROUTINE W REFLEX MICROSCOPIC
Glucose, UA: NEGATIVE mg/dL
Hgb urine dipstick: NEGATIVE
Ketones, ur: 15 mg/dL — AB
Nitrite: NEGATIVE
Protein, ur: 30 mg/dL — AB
Specific Gravity, Urine: 1.033 — ABNORMAL HIGH (ref 1.005–1.030)
Urobilinogen, UA: 1 mg/dL (ref 0.0–1.0)
pH: 5.5 (ref 5.0–8.0)

## 2013-07-16 LAB — URINE MICROSCOPIC-ADD ON

## 2013-07-16 LAB — COMPREHENSIVE METABOLIC PANEL
ALT: 15 U/L (ref 0–35)
AST: 16 U/L (ref 0–37)
Albumin: 4.4 g/dL (ref 3.5–5.2)
Alkaline Phosphatase: 47 U/L (ref 39–117)
BUN: 10 mg/dL (ref 6–23)
CO2: 24 mEq/L (ref 19–32)
Calcium: 9.8 mg/dL (ref 8.4–10.5)
Chloride: 103 mEq/L (ref 96–112)
Creatinine, Ser: 0.85 mg/dL (ref 0.50–1.10)
GFR calc Af Amer: >90 mL/min (ref >90)
GFR calc non Af Amer: >90 mL/min (ref >90)
Glucose, Bld: 92 mg/dL (ref 70–99)
Potassium: 3.5 mEq/L — ABNORMAL LOW (ref 3.7–5.3)
Sodium: 140 mEq/L (ref 137–147)
Total Bilirubin: 1.8 mg/dL — ABNORMAL HIGH (ref 0.3–1.2)
Total Protein: 7.3 g/dL (ref 6.0–8.3)

## 2013-07-16 LAB — LIPASE, BLOOD: Lipase: 21 U/L (ref 11–59)

## 2013-07-16 LAB — PREGNANCY, URINE: Preg Test, Ur: NEGATIVE

## 2013-07-16 NOTE — ED Notes (Signed)
Pt presents Left side abd pain and shooting pains into her Vagina x1-2 months, abnormal vaginal discharge, last normal menstrual cycle was the April 1st and then starting spotting again April 15th. Pt states she is unable to have sexual intercourse due to the pain

## 2013-07-17 ENCOUNTER — Emergency Department (HOSPITAL_COMMUNITY): Payer: Medicaid Other

## 2013-07-17 ENCOUNTER — Emergency Department (HOSPITAL_COMMUNITY)
Admission: EM | Admit: 2013-07-17 | Discharge: 2013-07-17 | Disposition: A | Discharge status: Home / Self Care | Payer: Medicaid Other | Attending: Emergency Medicine | Admitting: Emergency Medicine

## 2013-07-17 DIAGNOSIS — N730 Acute parametritis and pelvic cellulitis: Secondary | ICD-10-CM

## 2013-07-17 LAB — RPR

## 2013-07-17 LAB — HIV ANTIBODY (ROUTINE TESTING W REFLEX): HIV 1&2 Ab, 4th Generation: NONREACTIVE

## 2013-07-17 LAB — WET PREP, GENITAL
Trich, Wet Prep: NONE SEEN
Yeast Wet Prep HPF POC: NONE SEEN

## 2013-07-17 MED ORDER — AZITHROMYCIN 1 G PO PACK
1.0000 g | PACK | Freq: Once | ORAL | Status: AC
  Administered 2013-07-17: 1 g via ORAL
  Filled 2013-07-17: qty 1

## 2013-07-17 MED ORDER — IOHEXOL 300 MG/ML  SOLN
25.0000 mL | INTRAMUSCULAR | Status: DC | PRN
  Administered 2013-07-17: 25 mL via ORAL

## 2013-07-17 MED ORDER — ONDANSETRON HCL 4 MG/2ML IJ SOLN
4.0000 mg | Freq: Once | INTRAMUSCULAR | Status: AC
  Administered 2013-07-17: 4 mg via INTRAVENOUS
  Filled 2013-07-17: qty 2

## 2013-07-17 MED ORDER — DOXYCYCLINE HYCLATE 100 MG PO CAPS: 100.0000 mg | ORAL_CAPSULE | Freq: Two times a day (BID) | ORAL | Status: DC

## 2013-07-17 MED ORDER — SODIUM CHLORIDE 0.9 % IV SOLN
1000.0000 mL | INTRAVENOUS | Status: DC
  Administered 2013-07-17: 1000 mL via INTRAVENOUS

## 2013-07-17 MED ORDER — OXYCODONE-ACETAMINOPHEN 5-325 MG PO TABS: 1.0000 | ORAL_TABLET | ORAL | Status: DC | PRN

## 2013-07-17 MED ORDER — SODIUM CHLORIDE 0.9 % IV SOLN
1000.0000 mL | Freq: Once | INTRAVENOUS | Status: AC
  Administered 2013-07-17: 1000 mL via INTRAVENOUS

## 2013-07-17 MED ORDER — DOXYCYCLINE HYCLATE 100 MG PO TABS
100.0000 mg | ORAL_TABLET | Freq: Once | ORAL | Status: AC
  Administered 2013-07-17: 100 mg via ORAL
  Filled 2013-07-17: qty 1

## 2013-07-17 MED ORDER — DEXTROSE 5 % IV SOLN
1.0000 g | Freq: Once | INTRAVENOUS | Status: AC
  Administered 2013-07-17: 1 g via INTRAVENOUS
  Filled 2013-07-17: qty 10

## 2013-07-17 MED ORDER — MORPHINE SULFATE 4 MG/ML IJ SOLN
4.0000 mg | Freq: Once | INTRAMUSCULAR | Status: AC
  Administered 2013-07-17: 4 mg via INTRAVENOUS
  Filled 2013-07-17: qty 1

## 2013-07-17 MED ORDER — IOHEXOL 300 MG/ML  SOLN
100.0000 mL | Freq: Once | INTRAMUSCULAR | Status: AC | PRN
  Administered 2013-07-17: 100 mL via INTRAVENOUS

## 2013-07-17 NOTE — ED Provider Notes (Signed)
CSN: [id number]     Arrival date & time 07/16/13  2133 History   First MD Initiated Contact with Patient 07/17/13 0335     Chief Complaint  Patient presents with  . Abdominal Pain     (Consider location/radiation/quality/duration/timing/severity/associated sxs/prior Treatment) Patient is a 23 y.o. female presenting with abdominal pain. The history is provided by the patient.  Abdominal Pain She has been having pain in her left lower abdomen for the last month. Pain is sharp and shooting and had been fairly stable until this evening when it became severe. A when pain is present, it is as severe as 10/10. At rest, it is down to a 3/10. There is associated nausea but no vomiting. Pain is worse with walking and worse with intercourse. If she lays down, she is to keep her knees flexed. She denies fever, chills, sweats. She denies constipation or diarrhea. She has noted decreased urination. She has a history of interstitial cystitis, but the decreased urination is unusual for her. Last menses was April 1 and was normal but she did have some spotting on April 15. Also, for the last several days, she noted some sharp pains going into her vagina. There has been some vaginal discharge. There's been no radiation of pains to her flank her chest.  Past Medical History  Diagnosis Date  . Interstitial cystitis   . Chronic pelvic pain in female   . Depression    History reviewed. No pertinent past surgical history. History reviewed. No pertinent family history. History  Substance Use Topics  . Smoking status: Current Every Day Smoker  . Smokeless tobacco: Not on file  . Alcohol Use: No   OB History   Grav Para Term Preterm Abortions TAB SAB Ect Mult Living                 Review of Systems  Gastrointestinal: Positive for abdominal pain.  All other systems reviewed and are negative.     Allergies  Ciprofloxacin  Home Medications   Prior to Admission medications   Medication Sig Start Date  End Date Taking? Authorizing Provider  Aspirin-Acetaminophen-Caffeine (GOODY HEADACHE PO) Take 1 packet by mouth every 6 (six) hours as needed (for pain).   Yes Historical Provider, MD  naproxen sodium (ANAPROX) 220 MG tablet Take 220 mg by mouth 2 (two) times daily with a meal.   Yes Historical Provider, MD   BP 117/87  Pulse 113  Temp(Src) 97.7 F (36.5 C) (Oral)  Resp 18  Ht 5' 2"  (1.575 m)  Wt 158 lb (71.668 kg)  BMI 28.89 kg/m2  SpO2 97%  LMP 06/09/2013 Physical Exam  Nursing note and vitals reviewed.  23 year old female, resting comfortably and in no acute distress. Vital signs are significant for tachycardia with heart rate 113. Oxygen saturation is 97%, which is normal. Head is normocephalic and atraumatic. PERRLA, EOMI. Oropharynx is clear. Neck is nontender and supple without adenopathy or JVD. Back is nontender and there is no CVA tenderness. Lungs are clear without rales, wheezes, or rhonchi. Chest is nontender. Heart has regular rate and rhythm without murmur. Abdomen is soft, flat, with moderate tenderness throughout the left side of the abdomen. There is no rebound or guarding. There are no masses or hepatosplenomegaly and peristalsis is hypoactive. Pelvic: Normal external female genitalia. Moderate, thin white vaginal discharge present. Cervix appears inflamed with slight mucoid drainage. There is moderate cervical motion tenderness. There no adnexal masses and fundus is approximately 6-8 weeks size.  There is diffuse tenderness to palpation. Extremities have no cyanosis or edema, full range of motion is present. Skin is warm and dry without rash. Neurologic: Mental status is normal, cranial nerves are intact, there are no motor or sensory deficits.  ED Course  Procedures (including critical care time) Labs Review Results for orders placed during the hospital encounter of 07/17/13  WET PREP, GENITAL      Result Value Ref Range   Yeast Wet Prep HPF POC NONE SEEN   NONE SEEN   Trich, Wet Prep NONE SEEN  NONE SEEN   Clue Cells Wet Prep HPF POC FEW (*) NONE SEEN   WBC, Wet Prep HPF POC MODERATE (*) NONE SEEN  CBC WITH DIFFERENTIAL      Result Value Ref Range   WBC 5.6  4.0 - 10.5 K/uL   RBC 4.80  3.87 - 5.11 MIL/uL   Hemoglobin 15.8 (*) 12.0 - 15.0 g/dL   HCT 44.6  36.0 - 46.0 %   MCV 92.9  78.0 - 100.0 fL   MCH 32.9  26.0 - 34.0 pg   MCHC 35.4  30.0 - 36.0 g/dL   RDW 11.9  11.5 - 15.5 %   Platelets 194  150 - 400 K/uL   Neutrophils Relative % 45  43 - 77 %   Neutro Abs 2.5  1.7 - 7.7 K/uL   Lymphocytes Relative 47 (*) 12 - 46 %   Lymphs Abs 2.6  0.7 - 4.0 K/uL   Monocytes Relative 6  3 - 12 %   Monocytes Absolute 0.3  0.1 - 1.0 K/uL   Eosinophils Relative 2  0 - 5 %   Eosinophils Absolute 0.1  0.0 - 0.7 K/uL   Basophils Relative 0  0 - 1 %   Basophils Absolute 0.0  0.0 - 0.1 K/uL  COMPREHENSIVE METABOLIC PANEL      Result Value Ref Range   Sodium 140  137 - 147 mEq/L   Potassium 3.5 (*) 3.7 - 5.3 mEq/L   Chloride 103  96 - 112 mEq/L   CO2 24  19 - 32 mEq/L   Glucose, Bld 92  70 - 99 mg/dL   BUN 10  6 - 23 mg/dL   Creatinine, Ser 0.85  0.50 - 1.10 mg/dL   Calcium 9.8  8.4 - 10.5 mg/dL   Total Protein 7.3  6.0 - 8.3 g/dL   Albumin 4.4  3.5 - 5.2 g/dL   AST 16  0 - 37 U/L   ALT 15  0 - 35 U/L   Alkaline Phosphatase 47  39 - 117 U/L   Total Bilirubin 1.8 (*) 0.3 - 1.2 mg/dL   GFR calc non Af Amer >90  >90 mL/min   GFR calc Af Amer >90  >90 mL/min  LIPASE, BLOOD      Result Value Ref Range   Lipase 21  11 - 59 U/L  PREGNANCY, URINE      Result Value Ref Range   Preg Test, Ur NEGATIVE  NEGATIVE  URINALYSIS, ROUTINE W REFLEX MICROSCOPIC      Result Value Ref Range   Color, Urine AMBER (*) YELLOW   APPearance TURBID (*) CLEAR   Specific Gravity, Urine 1.033 (*) 1.005 - 1.030   pH 5.5  5.0 - 8.0   Glucose, UA NEGATIVE  NEGATIVE mg/dL   Hgb urine dipstick NEGATIVE  NEGATIVE   Bilirubin Urine SMALL (*) NEGATIVE   Ketones, ur 15 (*)  NEGATIVE mg/dL  Protein, ur 30 (*) NEGATIVE mg/dL   Urobilinogen, UA 1.0  0.0 - 1.0 mg/dL   Nitrite NEGATIVE  NEGATIVE   Leukocytes, UA MODERATE (*) NEGATIVE  URINE MICROSCOPIC-ADD ON      Result Value Ref Range   Squamous Epithelial / LPF MANY (*) RARE   WBC, UA 7-10  <3 WBC/hpf   RBC / HPF 0-2  <3 RBC/hpf   Bacteria, UA RARE  RARE   Imaging Review Ct Abdomen Pelvis W Contrast  07/17/2013   CLINICAL DATA:  One month history of left lower quadrant pain and vaginal bleeding. History of chronic pelvic pain and interstitial cystitis.  EXAM: CT ABDOMEN AND PELVIS WITH CONTRAST  TECHNIQUE: Multidetector CT imaging of the abdomen and pelvis was performed using the standard protocol following bolus administration of intravenous contrast.  CONTRAST:  143m OMNIPAQUE IOHEXOL 300 MG/ML  SOLN  COMPARISON:  Prior CT abdomen/ pelvis 03/07/2013  FINDINGS: Lower Chest: Respiratory motion limits evaluation of the lung bases. No focal consolidation. There is some geographic ground-glass attenuation opacity which was seen on the prior radiograph and may represent areas of air trapping from small vessels and/or small airways disease. Visualized cardiac structures are within normal limits for size. No pericardial effusion. Unremarkable distal thoracic esophagus.  Abdomen: Unremarkable CT appearance of the stomach, duodenum, spleen, adrenal glands and pancreas. Normal hepatic contour and morphology. Geographic hypoattenuation in the left hemi-liver adjacent to the fissure for the falciform ligament is nonspecific but most suggestive of benign focal fatty infiltration. No other discrete hepatic lesions. Gallbladder is unremarkable. No intra or extrahepatic biliary ductal dilatation. Unremarkable appearance of the bilateral kidneys. No focal solid lesion, hydronephrosis or nephrolithiasis.  No evidence of obstruction or focal bowel wall thickening. Normal appendix in the right lower quadrant. The terminal ileum is  unremarkable. No free fluid or suspicious adenopathy.  Pelvis: Surgical clips in the left inguinal region suggest prior inguinal hernia repair. Unremarkable uterus and bilateral adnexa. Trace free fluid in the pelvis likely physiologic. No suspicious adenopathy.  Bones/Soft Tissues: No acute fracture or aggressive appearing lytic or blastic osseous lesion.  Vascular: No significant atherosclerotic vascular disease, aneurysmal dilatation or acute abnormality.  IMPRESSION: No acute abnormality in the abdomen or pelvis to explain the patient's clinical symptoms.  Surgical changes of prior left inguinal hernia repair.   Electronically Signed   By: HJacqulynn CadetM.D.   On: 07/17/2013 07:18   Images viewed by me.  MDM   Final diagnoses:  PID (acute pelvic inflammatory disease)    Pelvic pain in a pattern that is worrisome for PID although ovarian cyst could cause similar symptoms. However, tenderness extending well into the left upper abdomen would be atypical for both PID and ovarian cyst. 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 Pelvic examination will be done and specimen sent for cultures. You also be sent for CT of abdomen and pelvis. Old records are reviewed and she had a CT of her abdomen and pelvis in December but that was for suspected renal calculus and the presentation was completely different.  CT is unremarkable. She is treated empirically for pelvic inflammatory disease, which is what she clinically appears to have. She's given a dose of ceftriaxone with the azithromycin as well as doxycycline and is discharged with prescription for doxycycline and oxycodone-acetaminophen. I recommended that she followup with her PCP in 3 days to evaluate initial response and  also to check on culture results. She is to return if symptoms are getting worse or not being adequately  controlled at home.  Delora Fuel, MD 22/97/98 [id number]

## 2013-07-17 NOTE — Discharge Instructions (Signed)
Pelvic Inflammatory Disease Pelvic inflammatory disease (PID) refers to an infection in some or all of the female organs. The infection can be in the uterus, ovaries, fallopian tubes, or the surrounding tissues in the pelvis. PID can cause abdominal or pelvic pain that comes on suddenly (acute pelvic pain). PID is a serious infection because it can lead to lasting (chronic) pelvic pain or the inability to have children (infertile).  CAUSES  The infection is often caused by the normal bacteria found in the vaginal tissues. PID may also be caused by an infection that is spread during sexual contact. PID can also occur following:   The birth of a baby.   A miscarriage.   An abortion.   Major pelvic surgery.   The use of an intrauterine device (IUD).   A sexual assault.  RISK FACTORS Certain factors can put a person at higher risk for PID, such as:  Being younger than 25 years.  Being sexually active at [place] age.  Usingnonbarrier contraception.  Havingmultiple sexual partners.  Having sex with someone who has symptoms of a genital infection.  Using oral contraception. Other times, certain behaviors can increase the possibility of getting PID, such as:  Having sex during your period.  Using a vaginal douche.  Having an intrauterine device (IUD) in place. SYMPTOMS   Abdominal or pelvic pain.   Fever.   Chills.   Abnormal vaginal discharge.  Abnormal uterine bleeding.   Unusual pain shortly after finishing your period. DIAGNOSIS  Your caregiver will choose some of the following methods to make a diagnosis, such as:   Performinga physical exam and history. A pelvic exam typically reveals a very tender uterus and surrounding pelvis.   Ordering laboratory tests including a pregnancy test, blood tests, and urine test.  Orderingcultures of the vagina and cervix to check for a sexually transmitted infection (STI).  Performing an ultrasound.    Performing a laparoscopic procedure to look inside the pelvis.  TREATMENT   Antibiotic medicines may be prescribed and taken by mouth.   Sexual partners may be treated when the infection is caused by a sexually transmitted disease (STD).   Hospitalization may be needed to give antibiotics intravenously.  Surgery may be needed, but this is rare. It may take weeks until you are completely well. If you are diagnosed with PID, you should also be checked for human immunodeficiency virus (HIV). HOME CARE INSTRUCTIONS   If given, take your antibiotics as directed. Finish the medicine even if you start to feel better.   Only take over-the-counter or prescription medicines for pain, discomfort, or fever as directed by your caregiver.   Do not have sexual intercourse until treatment is completed or as directed by your caregiver. If PID is confirmed, your recent sexual partner(s) will need treatment.   Keep your follow-up appointments. SEEK MEDICAL CARE IF:   You have increased or abnormal vaginal discharge.   You need prescription medicine for your pain.   You vomit.   You cannot take your medicines.   Your partner has an STD.  SEEK IMMEDIATE MEDICAL CARE IF:   You have a fever.   You have increased abdominal or pelvic pain.   You have chills.   You have pain when you urinate.   You are not better after 72 hours following treatment.  MAKE SURE YOU:   Understand these instructions.  Will watch your condition.  Will get help right away if you are not doing well or get worse.  Document Released: 02/25/2005 Document Revised: 06/22/2012 Document Reviewed: 02/21/2011 [facility] Patient Information 2014 [place], [place].  Doxycycline tablets or capsules What is this medicine? DOXYCYCLINE (dox i SYE kleen) is a tetracycline antibiotic. It kills certain bacteria or stops their growth. It is used to treat many kinds of infections, like dental, skin, respiratory,  and urinary tract infections. It also treats acne, Lyme disease, malaria, and certain sexually transmitted infections. This medicine may be used for other purposes; ask your health care provider or pharmacist if you have questions. COMMON BRAND NAME(S): Adoxa CK, Adoxa Pak, Adoxa TT, Adoxa, Alodox, Avidoxy, Doxal, Monodox, Morgidox 1x Kit, Morgidox 1x, Morgidox 2x , Morgidox 2x Kit, Ocudox , Vibra-Tabs, Vibramycin What should I tell my health care provider before I take this medicine? They need to know if you have any of these conditions: -liver disease -long exposure to sunlight like working outdoors -stomach problems like colitis -an unusual or allergic reaction to doxycycline, tetracycline antibiotics, other medicines, foods, dyes, or preservatives -pregnant or trying to get pregnant -breast-feeding How should I use this medicine? Take this medicine by mouth with a full glass of water. Follow the directions on the prescription label. It is best to take this medicine without food, but if it upsets your stomach take it with food. Take your medicine at regular intervals. Do not take your medicine more often than directed. Take all of your medicine as directed even if you think you are better. Do not skip doses or stop your medicine early. Talk to your pediatrician regarding the use of this medicine in children. Special care may be needed. While this drug may be prescribed for children as young as 37 years old for selected conditions, precautions do apply. Overdosage: If you think you have taken too much of this medicine contact a poison control center or emergency room at once. NOTE: This medicine is only for you. Do not share this medicine with others. What if I miss a dose? If you miss a dose, take it as soon as you can. If it is almost time for your next dose, take only that dose. Do not take double or extra doses. What may interact with this medicine? -antacids -barbiturates -birth control  pills -bismuth subsalicylate -carbamazepine -methoxyflurane -other antibiotics -phenytoin -vitamins that contain iron -warfarin This list may not describe all possible interactions. Give your health care provider a list of all the medicines, herbs, non-prescription drugs, or dietary supplements you use. Also tell them if you smoke, drink alcohol, or use illegal drugs. Some items may interact with your medicine. What should I watch for while using this medicine? Tell your doctor or health care professional if your symptoms do not improve. Do not treat diarrhea with over the counter products. Contact your doctor if you have diarrhea that lasts more than 2 days or if it is severe and watery. Do not take this medicine just before going to bed. It may not dissolve properly when you lay down and can cause pain in your throat. Drink plenty of fluids while taking this medicine to also help reduce irritation in your throat. This medicine can make you more sensitive to the sun. Keep out of the sun. If you cannot avoid being in the sun, wear protective clothing and use sunscreen. Do not use sun lamps or tanning beds/booths. Birth control pills may not work properly while you are taking this medicine. Talk to your doctor about using an extra method of birth control. If you are being treated  for a sexually transmitted infection, avoid sexual contact until you have finished your treatment. Your sexual partner may also need treatment. °Avoid antacids, aluminum, calcium, magnesium, and iron products for 4 hours before and 2 hours after taking a dose of this medicine. °If you are using this medicine to prevent malaria, you should still protect yourself from contact with mosquitos. Stay in screened-in areas, use mosquito nets, keep your body covered, and use an insect repellent. °What side effects may I notice from receiving this medicine? °Side effects that you should report to your doctor or health care professional  as soon as possible: °-allergic reactions like skin rash, itching or hives, swelling of the face, lips, or tongue °-difficulty breathing °-fever °-itching in the rectal or genital area °-pain on swallowing °-redness, blistering, peeling or loosening of the skin, including inside the mouth °-severe stomach pain or cramps °-unusual bleeding or bruising °-unusually weak or tired °-yellowing of the eyes or skin °Side effects that usually do not require medical attention (report to your doctor or health care professional if they continue or are bothersome): °-diarrhea °-loss of appetite °-nausea, vomiting °This list may not describe all possible side effects. Call your doctor for medical advice about side effects. You may report side effects to FDA at 1-800-FDA-1088. °Where should I keep my medicine? °Keep out of the reach of children. °Store at room temperature, below 30 degrees C (86 degrees F). Protect from light. Keep container tightly closed. Throw away any unused medicine after the expiration date. Taking this medicine after the expiration date can make you seriously ill. °NOTE: This sheet is a summary. It may not cover all possible information. If you have questions about this medicine, talk to your doctor, pharmacist, or health care provider. °© 2014, [employer]vier/[employer] Standard. (2007-06-16 16:53:02) ° °Acetaminophen; Oxycodone tablets °What is this medicine? °ACETAMINOPHEN; OXYCODONE (a set a MEE noe fen; ox i KOE done) is a pain reliever. It is used to treat mild to moderate pain. °This medicine may be used for other purposes; ask your health care provider or pharmacist if you have questions. °COMMON BRAND NAME(S): Endocet, Magnacet, Narvox, Percocet, Perloxx, Primalev, Primlev, Roxicet, Xolox °What should I tell my health care provider before I take this medicine? °They need to know if you have any of these conditions: °-brain tumor °-Crohn's disease, inflammatory bowel disease, or ulcerative colitis °-drug abuse  or addiction °-head injury °-heart or circulation problems °-if you often drink alcohol °-kidney disease or problems going to the bathroom °-liver disease °-lung disease, asthma, or breathing problems °-an unusual or allergic reaction to acetaminophen, oxycodone, other opioid analgesics, other medicines, foods, dyes, or preservatives °-pregnant or trying to get pregnant °-breast-feeding °How should I use this medicine? °Take this medicine by mouth with a full glass of water. Follow the directions on the prescription label. Take your medicine at regular intervals. Do not take your medicine more often than directed. °Talk to your pediatrician regarding the use of this medicine in children. Special care may be needed. °Patients over 65 years old may have a stronger reaction and need a smaller dose. °Overdosage: If you think you have taken too much of this medicine contact a poison control center or emergency room at once. °NOTE: This medicine is only for you. Do not share this medicine with others. °What if I miss a dose? °If you miss a dose, take it as soon as you can. If it is almost time for your next dose, take only that dose. Do   not take double or extra doses. What may interact with this medicine? -alcohol -antihistamines -barbiturates like amobarbital, butalbital, butabarbital, methohexital, pentobarbital, phenobarbital, thiopental, and secobarbital -benztropine -drugs for bladder problems like solifenacin, trospium, oxybutynin, tolterodine, hyoscyamine, and methscopolamine -drugs for breathing problems like ipratropium and tiotropium -drugs for certain stomach or intestine problems like propantheline, homatropine methylbromide, glycopyrrolate, atropine, belladonna, and dicyclomine -general anesthetics like etomidate, ketamine, nitrous oxide, propofol, desflurane, enflurane, halothane, isoflurane, and sevoflurane -medicines for depression, anxiety, or psychotic disturbances -medicines for  sleep -muscle relaxants -naltrexone -narcotic medicines (opiates) for pain -phenothiazines like perphenazine, thioridazine, chlorpromazine, mesoridazine, fluphenazine, prochlorperazine, promazine, and trifluoperazine -scopolamine -tramadol -trihexyphenidyl This list may not describe all possible interactions. Give your health care provider a list of all the medicines, herbs, non-prescription drugs, or dietary supplements you use. Also tell them if you smoke, drink alcohol, or use illegal drugs. Some items may interact with your medicine. What should I watch for while using this medicine? Tell your doctor or health care professional if your pain does not go away, if it gets worse, or if you have new or a different type of pain. You may develop tolerance to the medicine. Tolerance means that you will need a higher dose of the medication for pain relief. Tolerance is normal and is expected if you take this medicine for a long time. Do not suddenly stop taking your medicine because you may develop a severe reaction. Your body becomes used to the medicine. This does NOT mean you are addicted. Addiction is a behavior related to getting and using a drug for a non-medical reason. If you have pain, you have a medical reason to take pain medicine. Your doctor will tell you how much medicine to take. If your doctor wants you to stop the medicine, the dose will be slowly lowered over time to avoid any side effects. You may get drowsy or dizzy. Do not drive, use machinery, or do anything that needs mental alertness until you know how this medicine affects you. Do not stand or sit up quickly, especially if you are an older patient. This reduces the risk of dizzy or fainting spells. Alcohol may interfere with the effect of this medicine. Avoid alcoholic drinks. There are different types of narcotic medicines (opiates) for pain. If you take more than one type at the same time, you may have more side effects. Give your  health care provider a list of all medicines you use. Your doctor will tell you how much medicine to take. Do not take more medicine than directed. Call emergency for help if you have problems breathing. The medicine will cause constipation. Try to have a bowel movement at least every 2 to 3 days. If you do not have a bowel movement for 3 days, call your doctor or health care professional. Do not take Tylenol (acetaminophen) or medicines that have acetaminophen with this medicine. Too much acetaminophen can be very dangerous. Many nonprescription medicines contain acetaminophen. Always read the labels carefully to avoid taking more acetaminophen. What side effects may I notice from receiving this medicine? Side effects that you should report to your doctor or health care professional as soon as possible: -allergic reactions like skin rash, itching or hives, swelling of the face, lips, or tongue -breathing difficulties, wheezing -confusion -light headedness or fainting spells -severe stomach pain -unusually weak or tired -yellowing of the skin or the whites of the eyes  Side effects that usually do not require medical attention (report to your doctor or health  care professional if they continue or are bothersome): -dizziness -drowsiness -nausea -vomiting This list may not describe all possible side effects. Call your doctor for medical advice about side effects. You may report side effects to FDA at 1-800-FDA-1088. Where should I keep my medicine? Keep out of the reach of children. This medicine can be abused. Keep your medicine in a safe place to protect it from theft. Do not share this medicine with anyone. Selling or giving away this medicine is dangerous and against the law. Store at room temperature between 20 and 25 degrees C (68 and 77 degrees F). Keep container tightly closed. Protect from light. This medicine may cause accidental overdose and death if it is taken by other adults,  children, or pets. Flush any unused medicine down the toilet to reduce the chance of harm. Do not use the medicine after the expiration date. NOTE: This sheet is a summary. It may not cover all possible information. If you have questions about this medicine, talk to your doctor, pharmacist, or health care provider.  2014, Elsevier/[employer] Standard. (2012-10-19 13:17:35)

## 2013-07-19 LAB — GC/CHLAMYDIA PROBE AMP
CT Probe RNA: NEGATIVE
GC Probe RNA: NEGATIVE

## 2013-09-08 DIAGNOSIS — O2301 Infections of kidney in pregnancy, first trimester: Secondary | ICD-10-CM

## 2013-09-08 HISTORY — DX: Infections of kidney in pregnancy, first trimester: O23.01

## 2013-09-24 ENCOUNTER — Inpatient Hospital Stay (HOSPITAL_COMMUNITY)
Admission: EM | Admit: 2013-09-24 | Discharge: 2013-09-26 | DRG: 781 | Disposition: A | Discharge status: Home / Self Care | Payer: Medicaid Other | Attending: Obstetrics and Gynecology | Admitting: Obstetrics and Gynecology

## 2013-09-24 ENCOUNTER — Encounter (HOSPITAL_COMMUNITY): Payer: Self-pay | Admitting: Emergency Medicine

## 2013-09-24 DIAGNOSIS — N12 Tubulo-interstitial nephritis, not specified as acute or chronic: Secondary | ICD-10-CM | POA: Diagnosis present

## 2013-09-24 DIAGNOSIS — R109 Unspecified abdominal pain: Secondary | ICD-10-CM

## 2013-09-24 DIAGNOSIS — O210 Mild hyperemesis gravidarum: Secondary | ICD-10-CM | POA: Diagnosis present

## 2013-09-24 DIAGNOSIS — O2390 Unspecified genitourinary tract infection in pregnancy, unspecified trimester: Principal | ICD-10-CM | POA: Diagnosis present

## 2013-09-24 DIAGNOSIS — O99330 Smoking (tobacco) complicating pregnancy, unspecified trimester: Secondary | ICD-10-CM | POA: Diagnosis present

## 2013-09-24 DIAGNOSIS — N949 Unspecified condition associated with female genital organs and menstrual cycle: Secondary | ICD-10-CM | POA: Diagnosis present

## 2013-09-24 DIAGNOSIS — G8929 Other chronic pain: Secondary | ICD-10-CM | POA: Diagnosis present

## 2013-09-24 DIAGNOSIS — O2301 Infections of kidney in pregnancy, first trimester: Secondary | ICD-10-CM

## 2013-09-24 LAB — CBC WITH DIFFERENTIAL/PLATELET
Basophils Absolute: 0 10*3/uL (ref 0.0–0.1)
Basophils Relative: 0 % (ref 0–1)
Eosinophils Absolute: 0.1 10*3/uL (ref 0.0–0.7)
Eosinophils Relative: 1 % (ref 0–5)
HCT: 41.3 % (ref 36.0–46.0)
Hemoglobin: 14.7 g/dL (ref 12.0–15.0)
Lymphocytes Relative: 39 % (ref 12–46)
Lymphs Abs: 2.7 10*3/uL (ref 0.7–4.0)
MCH: 32.7 pg (ref 26.0–34.0)
MCHC: 35.6 g/dL (ref 30.0–36.0)
MCV: 91.8 fL (ref 78.0–100.0)
Monocytes Absolute: 0.5 10*3/uL (ref 0.1–1.0)
Monocytes Relative: 7 % (ref 3–12)
Neutro Abs: 3.6 10*3/uL (ref 1.7–7.7)
Neutrophils Relative %: 53 % (ref 43–77)
Platelets: 182 10*3/uL (ref 150–400)
RBC: 4.5 MIL/uL (ref 3.87–5.11)
RDW: 11.2 % — ABNORMAL LOW (ref 11.5–15.5)
WBC: 6.8 10*3/uL (ref 4.0–10.5)

## 2013-09-24 LAB — COMPREHENSIVE METABOLIC PANEL
ALT: 11 U/L (ref 0–35)
AST: 12 U/L (ref 0–37)
Albumin: 3.8 g/dL (ref 3.5–5.2)
Alkaline Phosphatase: 35 U/L — ABNORMAL LOW (ref 39–117)
Anion gap: 14 (ref 5–15)
BUN: 6 mg/dL (ref 6–23)
CO2: 23 mEq/L (ref 19–32)
Calcium: 9.2 mg/dL (ref 8.4–10.5)
Chloride: 101 mEq/L (ref 96–112)
Creatinine, Ser: 0.64 mg/dL (ref 0.50–1.10)
GFR calc Af Amer: >90 mL/min (ref >90)
GFR calc non Af Amer: >90 mL/min (ref >90)
Glucose, Bld: 83 mg/dL (ref 70–99)
Potassium: 3.6 mEq/L — ABNORMAL LOW (ref 3.7–5.3)
Sodium: 138 mEq/L (ref 137–147)
Total Bilirubin: 1.7 mg/dL — ABNORMAL HIGH (ref 0.3–1.2)
Total Protein: 6.3 g/dL (ref 6.0–8.3)

## 2013-09-24 MED ORDER — ONDANSETRON 4 MG PO TBDP
ORAL_TABLET | ORAL | Status: AC
  Filled 2013-09-24: qty 2

## 2013-09-24 MED ORDER — SODIUM CHLORIDE 0.9 % IV BOLUS (SEPSIS)
2000.0000 mL | Freq: Once | INTRAVENOUS | Status: AC
Start: 2013-09-24 — End: 2013-09-25
  Administered 2013-09-24: 2000 mL via INTRAVENOUS

## 2013-09-24 MED ORDER — ONDANSETRON HCL 4 MG/2ML IJ SOLN
4.0000 mg | Freq: Once | INTRAMUSCULAR | Status: AC
  Administered 2013-09-24: 4 mg via INTRAVENOUS
  Filled 2013-09-24: qty 2

## 2013-09-24 MED ORDER — MORPHINE SULFATE 4 MG/ML IJ SOLN
6.0000 mg | Freq: Once | INTRAMUSCULAR | Status: AC
  Administered 2013-09-24: 4 mg via INTRAVENOUS
  Filled 2013-09-24: qty 2

## 2013-09-24 MED ORDER — OXYCODONE-ACETAMINOPHEN 5-325 MG PO TABS
1.0000 | ORAL_TABLET | Freq: Once | ORAL | Status: AC
  Administered 2013-09-24: 1 via ORAL
  Filled 2013-09-24: qty 1

## 2013-09-24 MED ORDER — ONDANSETRON 4 MG PO TBDP
8.0000 mg | ORAL_TABLET | Freq: Once | ORAL | Status: AC
  Administered 2013-09-24: 8 mg via ORAL

## 2013-09-24 MED ORDER — IOHEXOL 300 MG/ML  SOLN
25.0000 mL | Freq: Once | INTRAMUSCULAR | Status: AC | PRN
  Administered 2013-09-24: 25 mL via ORAL

## 2013-09-24 NOTE — ED Notes (Signed)
Pt coming from home with c/o right side abdominal pain, right side back pain. Pt was dx with kidney infection on Monday. Pt has progressively worsened. Pt has started the antibiotic. Pt also c/o n/v.

## 2013-09-24 NOTE — ED Notes (Signed)
Pt's O2 saturation dropped to 78%. Pleth was not good, so this RN was unsure of the validity of the reading, however, this Rn placed the pt on 2L of O2, and her sats immediately came back up to 100%.

## 2013-09-24 NOTE — ED Notes (Signed)
EMS vital signs: 98.1 oral, 143/91, 98% RA, 86 HR resp 18

## 2013-09-25 ENCOUNTER — Emergency Department (HOSPITAL_COMMUNITY): Payer: Medicaid Other

## 2013-09-25 ENCOUNTER — Encounter (HOSPITAL_COMMUNITY): Payer: Self-pay | Admitting: Obstetrics and Gynecology

## 2013-09-25 DIAGNOSIS — N12 Tubulo-interstitial nephritis, not specified as acute or chronic: Secondary | ICD-10-CM | POA: Diagnosis present

## 2013-09-25 DIAGNOSIS — O2301 Infections of kidney in pregnancy, first trimester: Secondary | ICD-10-CM | POA: Diagnosis present

## 2013-09-25 DIAGNOSIS — G8929 Other chronic pain: Secondary | ICD-10-CM | POA: Diagnosis present

## 2013-09-25 DIAGNOSIS — O2390 Unspecified genitourinary tract infection in pregnancy, unspecified trimester: Secondary | ICD-10-CM | POA: Diagnosis present

## 2013-09-25 DIAGNOSIS — O210 Mild hyperemesis gravidarum: Secondary | ICD-10-CM | POA: Diagnosis present

## 2013-09-25 DIAGNOSIS — O99330 Smoking (tobacco) complicating pregnancy, unspecified trimester: Secondary | ICD-10-CM | POA: Diagnosis present

## 2013-09-25 DIAGNOSIS — N949 Unspecified condition associated with female genital organs and menstrual cycle: Secondary | ICD-10-CM | POA: Diagnosis present

## 2013-09-25 LAB — URINE MICROSCOPIC-ADD ON

## 2013-09-25 LAB — CBC WITH DIFFERENTIAL/PLATELET
Basophils Absolute: 0 10*3/uL (ref 0.0–0.1)
Basophils Relative: 0 % (ref 0–1)
Eosinophils Absolute: 0.1 10*3/uL (ref 0.0–0.7)
Eosinophils Relative: 2 % (ref 0–5)
HCT: 32.5 % — ABNORMAL LOW (ref 36.0–46.0)
Hemoglobin: 11.6 g/dL — ABNORMAL LOW (ref 12.0–15.0)
Lymphocytes Relative: 53 % — ABNORMAL HIGH (ref 12–46)
Lymphs Abs: 2.6 10*3/uL (ref 0.7–4.0)
MCH: 33 pg (ref 26.0–34.0)
MCHC: 35.7 g/dL (ref 30.0–36.0)
MCV: 92.3 fL (ref 78.0–100.0)
Monocytes Absolute: 0.2 10*3/uL (ref 0.1–1.0)
Monocytes Relative: 5 % (ref 3–12)
Neutro Abs: 2 10*3/uL (ref 1.7–7.7)
Neutrophils Relative %: 40 % — ABNORMAL LOW (ref 43–77)
Platelets: 126 10*3/uL — ABNORMAL LOW (ref 150–400)
RBC: 3.52 MIL/uL — ABNORMAL LOW (ref 3.87–5.11)
RDW: 11.4 % — ABNORMAL LOW (ref 11.5–15.5)
WBC: 4.9 10*3/uL (ref 4.0–10.5)

## 2013-09-25 LAB — URINALYSIS, ROUTINE W REFLEX MICROSCOPIC
Bilirubin Urine: NEGATIVE
Glucose, UA: NEGATIVE mg/dL
Hgb urine dipstick: NEGATIVE
Ketones, ur: NEGATIVE mg/dL
Nitrite: NEGATIVE
Protein, ur: NEGATIVE mg/dL
Specific Gravity, Urine: 1.01 (ref 1.005–1.030)
Urobilinogen, UA: 0.2 mg/dL (ref 0.0–1.0)
pH: 5.5 (ref 5.0–8.0)

## 2013-09-25 LAB — I-STAT CG4 LACTIC ACID, ED: Lactic Acid, Venous: 1.19 mmol/L (ref 0.5–2.2)

## 2013-09-25 LAB — HCG, QUANTITATIVE, PREGNANCY: hCG, Beta Chain, Quant, S: 76920 m[IU]/mL — ABNORMAL HIGH (ref <5)

## 2013-09-25 LAB — SAMPLE TO BLOOD BANK

## 2013-09-25 LAB — POC URINE PREG, ED: Preg Test, Ur: POSITIVE — AB

## 2013-09-25 MED ORDER — ONDANSETRON HCL 4 MG PO TABS: 4.0000 mg | ORAL_TABLET | Freq: Four times a day (QID) | ORAL | Status: DC | PRN

## 2013-09-25 MED ORDER — POLYETHYLENE GLYCOL 3350 17 G PO PACK
17.0000 g | PACK | Freq: Two times a day (BID) | ORAL | Status: DC
  Administered 2013-09-25: 17 g via ORAL
  Filled 2013-09-25 (×2): qty 1

## 2013-09-25 MED ORDER — SODIUM CHLORIDE 0.9 % IV SOLN
INTRAVENOUS | Status: DC
  Administered 2013-09-25 – 2013-09-26 (×3): via INTRAVENOUS

## 2013-09-25 MED ORDER — LACTATED RINGERS IV SOLN: INTRAVENOUS | Status: DC

## 2013-09-25 MED ORDER — OXYCODONE-ACETAMINOPHEN 5-325 MG PO TABS
1.0000 | ORAL_TABLET | ORAL | Status: DC | PRN
Start: 2013-09-25 — End: 2013-09-26
  Administered 2013-09-25 (×2): 2 via ORAL
  Filled 2013-09-25 (×2): qty 2

## 2013-09-25 MED ORDER — ACETAMINOPHEN 325 MG PO TABS: 650.0000 mg | ORAL_TABLET | ORAL | Status: DC | PRN

## 2013-09-25 MED ORDER — ONDANSETRON HCL 4 MG PO TABS: 8.0000 mg | ORAL_TABLET | Freq: Three times a day (TID) | ORAL | Status: DC | PRN

## 2013-09-25 MED ORDER — HYDROMORPHONE HCL PF 1 MG/ML IJ SOLN
0.5000 mg | INTRAMUSCULAR | Status: DC | PRN
  Administered 2013-09-25: 0.5 mg via INTRAVENOUS
  Filled 2013-09-25: qty 1

## 2013-09-25 MED ORDER — ACETAMINOPHEN 325 MG PO TABS
650.0000 mg | ORAL_TABLET | Freq: Once | ORAL | Status: AC
  Administered 2013-09-25: 650 mg via ORAL
  Filled 2013-09-25: qty 2

## 2013-09-25 MED ORDER — HYDROMORPHONE HCL PF 1 MG/ML IJ SOLN
1.0000 mg | INTRAMUSCULAR | Status: DC | PRN
  Administered 2013-09-26: 1 mg via INTRAVENOUS
  Filled 2013-09-25: qty 1

## 2013-09-25 MED ORDER — DEXTROSE 5 % IV SOLN
1.0000 g | Freq: Two times a day (BID) | INTRAVENOUS | Status: DC
  Administered 2013-09-25 – 2013-09-26 (×3): 1 g via INTRAVENOUS
  Filled 2013-09-25 (×3): qty 10

## 2013-09-25 MED ORDER — ALUM & MAG HYDROXIDE-SIMETH 200-200-20 MG/5ML PO SUSP
30.0000 mL | ORAL | Status: DC | PRN
Start: 2013-09-25 — End: 2013-09-26

## 2013-09-25 MED ORDER — ONDANSETRON HCL 4 MG/2ML IJ SOLN
4.0000 mg | Freq: Once | INTRAMUSCULAR | Status: AC
  Administered 2013-09-25: 4 mg via INTRAVENOUS
  Filled 2013-09-25: qty 2

## 2013-09-25 MED ORDER — DEXTROSE 5 % IV SOLN
1.0000 g | Freq: Once | INTRAVENOUS | Status: AC
  Administered 2013-09-25: 1 g via INTRAVENOUS
  Filled 2013-09-25: qty 10

## 2013-09-25 MED ORDER — PRENATAL MULTIVITAMIN CH
1.0000 | ORAL_TABLET | Freq: Every day | ORAL | Status: DC
  Filled 2013-09-25: qty 1

## 2013-09-25 MED ORDER — OXYCODONE HCL 5 MG PO TABS
5.0000 mg | ORAL_TABLET | ORAL | Status: DC | PRN
  Administered 2013-09-25 – 2013-09-26 (×3): 5 mg via ORAL
  Filled 2013-09-25 (×3): qty 1

## 2013-09-25 MED ORDER — ONDANSETRON HCL 4 MG/2ML IJ SOLN
4.0000 mg | Freq: Four times a day (QID) | INTRAMUSCULAR | Status: DC | PRN
  Administered 2013-09-25 (×2): 4 mg via INTRAVENOUS
  Filled 2013-09-25 (×2): qty 2

## 2013-09-25 NOTE — ED Provider Notes (Signed)
CSN: [id number]     Arrival date & time 09/24/13  2018 History   First MD Initiated Contact with Patient 09/24/13 2205     Chief Complaint  Patient presents with  . Abdominal Pain  . Back Pain  . Pyelonephritis     (Consider location/radiation/quality/duration/timing/severity/associated sxs/prior Treatment) HPI Patient presents to the emergency department with right-sided abdominal pain, with right back pain.  She said she was diagnosed with a urinary tract infection earlier in the week.  The patient, states, that the pain has gotten worse since Monday and started on antibiotics.  Patient, states, that she's had some nausea and vomiting as well.  The patient, states, that she's not had any chest pain, shortness of breath, weakness, dizziness, neck pain, fever , diarrhea, hematuria, dysuria, vaginal bleeding, vaginal discharge, bloody stool, rash, or syncope.  Patient, states she did not take any other medications prior to arrival  Past Medical History  Diagnosis Date  . Interstitial cystitis   . Chronic pelvic pain in female   . Depression    History reviewed. No pertinent past surgical history. History reviewed. No pertinent family history. History  Substance Use Topics  . Smoking status: Current Every Day Smoker  . Smokeless tobacco: Not on file  . Alcohol Use: No   OB History   Grav Para Term Preterm Abortions TAB SAB Ect Mult Living                 Review of Systems  All other systems negative except as documented in the HPI. All pertinent positives and negatives as reviewed in the HPI.  Allergies  Ciprofloxacin  Home Medications   Prior to Admission medications   Medication Sig Start Date End Date Taking? Authorizing Provider  ondansetron (ZOFRAN) 8 MG tablet Take 8 mg by mouth every 8 (eight) hours as needed for nausea or vomiting.   Yes Historical Provider, MD  Oxycodone HCl 10 MG TABS Take by mouth 3 (three) times daily.   Yes Historical Provider, MD    sulfamethoxazole-trimethoprim (BACTRIM DS) 800-160 MG per tablet Take 1 tablet by mouth 2 (two) times daily.   Yes Historical Provider, MD   BP 86/61  Pulse 58  Temp(Src) 98.4 F (36.9 C) (Oral)  Resp 19  Ht 5\' 2"  (1.575 m)  Wt 161 lb (73.029 kg)  BMI 29.44 kg/m2  SpO2 100%  LMP 09/24/2013 Physical Exam  Nursing note and vitals reviewed. Constitutional: She is oriented to person, place, and time. She appears well-developed and well-nourished. No distress.  HENT:  Head: Normocephalic and atraumatic.  Mouth/Throat: Oropharynx is clear and moist.  Eyes: Pupils are equal, round, and reactive to light.  Neck: Normal range of motion. Neck supple.  Cardiovascular: Normal rate, regular rhythm and normal heart sounds.  Exam reveals no gallop and no friction rub.   No murmur heard. Pulmonary/Chest: Effort normal and breath sounds normal. No respiratory distress.  Abdominal: Soft. Normal appearance and bowel sounds are normal. She exhibits no distension. There is tenderness. There is no rebound and no guarding.    Neurological: She is alert and oriented to person, place, and time. She exhibits normal muscle tone. Coordination normal.  Skin: Skin is warm and dry. No rash noted. No erythema.    ED Course  Procedures (including critical care time) Labs Review Labs Reviewed  CBC WITH DIFFERENTIAL - Abnormal; Notable for the following:    RDW 11.2 (*)    All other components within normal limits  COMPREHENSIVE METABOLIC  PANEL - Abnormal; Notable for the following:    Potassium 3.6 (*)    Alkaline Phosphatase 35 (*)    Total Bilirubin 1.7 (*)    All other components within normal limits  URINALYSIS, ROUTINE W REFLEX MICROSCOPIC - Abnormal; Notable for the following:    APPearance CLOUDY (*)    Leukocytes, UA LARGE (*)    All other components within normal limits  HCG, QUANTITATIVE, PREGNANCY - Abnormal; Notable for the following:    hCG, Beta Chain, [place] [id number] (*)    All other  components within normal limits  URINE MICROSCOPIC-ADD ON - Abnormal; Notable for the following:    Squamous Epithelial / LPF MANY (*)    Bacteria, UA FEW (*)    All other components within normal limits  POC URINE PREG, ED - Abnormal; Notable for the following:    Preg Test, Ur POSITIVE (*)    All other components within normal limits  URINE CULTURE   Results for orders placed during the hospital encounter of 09/24/13  CBC WITH DIFFERENTIAL      Result Value Ref Range   WBC 6.8  4.0 - 10.5 K/uL   RBC 4.50  3.87 - 5.11 MIL/uL   Hemoglobin 14.7  12.0 - 15.0 g/dL   HCT 64.3  32.9 - 51.8 %   MCV 91.8  78.0 - 100.0 fL   MCH 32.7  26.0 - 34.0 pg   MCHC 35.6  30.0 - 36.0 g/dL   RDW 84.1 (*) 66.0 - 63.0 %   Platelets 182  150 - 400 K/uL   Neutrophils Relative % 53  43 - 77 %   Neutro Abs 3.6  1.7 - 7.7 K/uL   Lymphocytes Relative 39  12 - 46 %   Lymphs Abs 2.7  0.7 - 4.0 K/uL   Monocytes Relative 7  3 - 12 %   Monocytes Absolute 0.5  0.1 - 1.0 K/uL   Eosinophils Relative 1  0 - 5 %   Eosinophils Absolute 0.1  0.0 - 0.7 K/uL   Basophils Relative 0  0 - 1 %   Basophils Absolute 0.0  0.0 - 0.1 K/uL  COMPREHENSIVE METABOLIC PANEL      Result Value Ref Range   Sodium 138  137 - 147 mEq/L   Potassium 3.6 (*) 3.7 - 5.3 mEq/L   Chloride 101  96 - 112 mEq/L   CO2 23  19 - 32 mEq/L   Glucose, Bld 83  70 - 99 mg/dL   BUN 6  6 - 23 mg/dL   Creatinine, Ser 1.60  0.50 - 1.10 mg/dL   Calcium 9.2  8.4 - 10.9 mg/dL   Total Protein 6.3  6.0 - 8.3 g/dL   Albumin 3.8  3.5 - 5.2 g/dL   AST 12  0 - 37 U/L   ALT 11  0 - 35 U/L   Alkaline Phosphatase 35 (*) 39 - 117 U/L   Total Bilirubin 1.7 (*) 0.3 - 1.2 mg/dL   GFR calc non Af Amer >90  >90 mL/min   GFR calc Af Amer >90  >90 mL/min   Anion gap 14  5 - 15  URINALYSIS, ROUTINE W REFLEX MICROSCOPIC      Result Value Ref Range   Color, Urine YELLOW  YELLOW   APPearance CLOUDY (*) CLEAR   Specific Gravity, Urine 1.010  1.005 - 1.030   pH 5.5   5.0 - 8.0   Glucose, UA NEGATIVE  NEGATIVE mg/dL   Hgb urine dipstick NEGATIVE  NEGATIVE   Bilirubin Urine NEGATIVE  NEGATIVE   Ketones, ur NEGATIVE  NEGATIVE mg/dL   Protein, ur NEGATIVE  NEGATIVE mg/dL   Urobilinogen, UA 0.2  0.0 - 1.0 mg/dL   Nitrite NEGATIVE  NEGATIVE   Leukocytes, UA LARGE (*) NEGATIVE  HCG, QUANTITATIVE, PREGNANCY      Result Value Ref Range   hCG, Beta Chain, Quant, [place] [id number] (*) <5 mIU/mL  URINE MICROSCOPIC-ADD ON      Result Value Ref Range   Squamous Epithelial / LPF MANY (*) RARE   WBC, UA 7-10  <3 WBC/hpf   RBC / HPF 0-2  <3 RBC/hpf   Bacteria, UA FEW (*) RARE   Urine-Other MUCOUS PRESENT    POC URINE PREG, ED      Result Value Ref Range   Preg Test, Ur POSITIVE (*) NEGATIVE     Patient's blood pressure is concerning, but she is mentating appropriately, and not in any acute distress.  Patient has a concern for ectopic pregnancy, therefore, we will get an ultrasound.  Patient is advised of the results thus far.  All questions were answered     Carlyle DollyChristopher W Jovita Persing, PA-C 09/25/13 0123

## 2013-09-25 NOTE — ED Notes (Signed)
Pt remains in ultrasound.

## 2013-09-25 NOTE — ED Notes (Addendum)
AC at [facility] hospital, Okey Regal, made aware that the pt needs a bed. This RN to call back with the name of the accepting physician.

## 2013-09-25 NOTE — ED Provider Notes (Signed)
Medical screening examination/treatment/procedure(s) were conducted as a shared visit with non-physician practitioner(s) and myself.  I personally evaluated the patient during the encounter.   EKG Interpretation None     Pt with right flank pain x 10 days, recently seen by pcp and started on Bactrim for UTI.  Pain worsening, n/v present.  Pt incidentally noted to be pregnant here.  Pt with hypotension, somewhat responsive to fluids.  Bedside FAST exam without free fluid.  IUP noted.  Formal u/s completed.  Pt with persistent pain, hypotension.  D/w hospitalist who wished OB input.  Dr Emelda Fear with OB will admit to [facility]   Results for orders placed during the hospital encounter of 09/24/13  CBC WITH DIFFERENTIAL      Result Value Ref Range   WBC 6.8  4.0 - 10.5 K/uL   RBC 4.50  3.87 - 5.11 MIL/uL   Hemoglobin 14.7  12.0 - 15.0 g/dL   HCT 16.1  09.6 - 04.5 %   MCV 91.8  78.0 - 100.0 fL   MCH 32.7  26.0 - 34.0 pg   MCHC 35.6  30.0 - 36.0 g/dL   RDW 40.9 (*) 81.1 - 91.4 %   Platelets 182  150 - 400 K/uL   Neutrophils Relative % 53  43 - 77 %   Neutro Abs 3.6  1.7 - 7.7 K/uL   Lymphocytes Relative 39  12 - 46 %   Lymphs Abs 2.7  0.7 - 4.0 K/uL   Monocytes Relative 7  3 - 12 %   Monocytes Absolute 0.5  0.1 - 1.0 K/uL   Eosinophils Relative 1  0 - 5 %   Eosinophils Absolute 0.1  0.0 - 0.7 K/uL   Basophils Relative 0  0 - 1 %   Basophils Absolute 0.0  0.0 - 0.1 K/uL  COMPREHENSIVE METABOLIC PANEL      Result Value Ref Range   Sodium 138  137 - 147 mEq/L   Potassium 3.6 (*) 3.7 - 5.3 mEq/L   Chloride 101  96 - 112 mEq/L   CO2 23  19 - 32 mEq/L   Glucose, Bld 83  70 - 99 mg/dL   BUN 6  6 - 23 mg/dL   Creatinine, Ser 7.82  0.50 - 1.10 mg/dL   Calcium 9.2  8.4 - 95.6 mg/dL   Total Protein 6.3  6.0 - 8.3 g/dL   Albumin 3.8  3.5 - 5.2 g/dL   AST 12  0 - 37 U/L   ALT 11  0 - 35 U/L   Alkaline Phosphatase 35 (*) 39 - 117 U/L   Total Bilirubin 1.7 (*) 0.3 - 1.2 mg/dL   GFR calc non  Af Amer >90  >90 mL/min   GFR calc Af Amer >90  >90 mL/min   Anion gap 14  5 - 15  URINALYSIS, ROUTINE W REFLEX MICROSCOPIC      Result Value Ref Range   Color, Urine YELLOW  YELLOW   APPearance CLOUDY (*) CLEAR   Specific Gravity, Urine 1.010  1.005 - 1.030   pH 5.5  5.0 - 8.0   Glucose, UA NEGATIVE  NEGATIVE mg/dL   Hgb urine dipstick NEGATIVE  NEGATIVE   Bilirubin Urine NEGATIVE  NEGATIVE   Ketones, ur NEGATIVE  NEGATIVE mg/dL   Protein, ur NEGATIVE  NEGATIVE mg/dL   Urobilinogen, UA 0.2  0.0 - 1.0 mg/dL   Nitrite NEGATIVE  NEGATIVE   Leukocytes, UA LARGE (*) NEGATIVE  HCG, QUANTITATIVE,  PREGNANCY      Result Value Ref Range   hCG, Beta Chain, [place] [address] (*) <5 mIU/mL  URINE MICROSCOPIC-ADD ON      Result Value Ref Range   Squamous Epithelial / LPF MANY (*) RARE   WBC, UA 7-10  <3 WBC/hpf   RBC / HPF 0-2  <3 RBC/hpf   Bacteria, UA FEW (*) RARE   Urine-Other MUCOUS PRESENT    POC URINE PREG, ED      Result Value Ref Range   Preg Test, Ur POSITIVE (*) NEGATIVE  I-STAT CG4 LACTIC ACID, ED      Result Value Ref Range   Lactic Acid, Venous 1.19  0.5 - 2.2 mmol/L  SAMPLE TO BLOOD BANK      Result Value Ref Range   Blood Bank Specimen SAMPLE AVAILABLE FOR TESTING     Sample Expiration 09/26/2013     US Ob Comp Less 14 Wks  09/25/2013   CLINICAL DATA:  Abdominal pain. Gestational age by last menstrual period is 7 weeks and 0 days.  EXAM: OBSTETRIC <14 WK [place] AND TRANSVAGINAL OB  TECHNIQUE: Both transabdominal and transvaginal ultrasound examinations were performed for complete evaluation of the gestation as well as the maternal uterus, adnexal regions, and pelvic cul-de-sac. Transvaginal technique was performed to assess early pregnancy.  COMPARISON:  CT of the abdomen and pelvis Jul 17, 2013  FINDINGS: Intrauterine gestational sac: Visualized/normal in shape.  Yolk sac:  Present  Embryo:  Present  Cardiac Activity: Present  Heart Rate: 158 bpm  CRL:   14.4  mm   7 w 5 d                   [place] EDC: February 29, 2015  Maternal uterus/adnexae: 17 x 14 x 8 mm subchorionic hemorrhage. Normal appearance of the adnexae. No free fluid in the pelvis.  IMPRESSION: Single live intrauterine pregnancy, 7 weeks and 5 days by ultrasound, EDD February 29, 2015 with small to moderate subchorionic hemorrhage.   Electronically Signed   By: Awilda Metro   On: 09/25/2013 03:55   US Ob Transvaginal  09/25/2013   CLINICAL DATA:  Abdominal pain. Gestational age by last menstrual period is 7 weeks and 0 days.  EXAM: OBSTETRIC <14 WK [place] AND TRANSVAGINAL OB  TECHNIQUE: Both transabdominal and transvaginal ultrasound examinations were performed for complete evaluation of the gestation as well as the maternal uterus, adnexal regions, and pelvic cul-de-sac. Transvaginal technique was performed to assess early pregnancy.  COMPARISON:  CT of the abdomen and pelvis Jul 17, 2013  FINDINGS: Intrauterine gestational sac: Visualized/normal in shape.  Yolk sac:  Present  Embryo:  Present  Cardiac Activity: Present  Heart Rate: 158 bpm  CRL:   14.4  mm   7 w 5 d                  [place] EDC: February 29, 2015  Maternal uterus/adnexae: 17 x 14 x 8 mm subchorionic hemorrhage. Normal appearance of the adnexae. No free fluid in the pelvis.  IMPRESSION: Single live intrauterine pregnancy, 7 weeks and 5 days by ultrasound, EDD February 29, 2015 with small to moderate subchorionic hemorrhage.   Electronically Signed   By: Awilda Metro   On: 09/25/2013 03:55   US Renal  09/25/2013   CLINICAL DATA:  Abdominal pain and back pain.  Known pyelonephritis.  EXAM: RENAL/URINARY TRACT ULTRASOUND COMPLETE  COMPARISON:  CT of the abdomen and pelvis performed 07/17/2013  FINDINGS: Right Kidney:  Length: 11.6 cm. Echogenicity within normal limits. No mass or hydronephrosis visualized.  Left Kidney:  Length: 10.5 cm. Echogenicity within normal limits. No mass or hydronephrosis visualized.  Bladder:  Appears normal for degree of bladder  distention.  IMPRESSION: Unremarkable renal ultrasound.   Electronically Signed   By: Roanna RaiderJeffery  Chang M.D.   On: 09/25/2013 04:14      Olivia Mackielga M Kolt Mcwhirter, MD 09/25/13 0500

## 2013-09-25 NOTE — ED Notes (Signed)
Pt has left with [facility] °

## 2013-09-25 NOTE — ED Notes (Signed)
Pt has returned from ultrasound.  

## 2013-09-26 DIAGNOSIS — O2390 Unspecified genitourinary tract infection in pregnancy, unspecified trimester: Secondary | ICD-10-CM

## 2013-09-26 DIAGNOSIS — G8929 Other chronic pain: Secondary | ICD-10-CM

## 2013-09-26 DIAGNOSIS — N949 Unspecified condition associated with female genital organs and menstrual cycle: Secondary | ICD-10-CM

## 2013-09-26 DIAGNOSIS — N12 Tubulo-interstitial nephritis, not specified as acute or chronic: Secondary | ICD-10-CM

## 2013-09-26 LAB — URINE CULTURE: Colony Count: 85000

## 2013-09-26 LAB — CBC WITH DIFFERENTIAL/PLATELET
Basophils Absolute: 0 10*3/uL (ref 0.0–0.1)
Basophils Relative: 0 % (ref 0–1)
Eosinophils Absolute: 0.1 10*3/uL (ref 0.0–0.7)
Eosinophils Relative: 1 % (ref 0–5)
HCT: 32.3 % — ABNORMAL LOW (ref 36.0–46.0)
Hemoglobin: 11.2 g/dL — ABNORMAL LOW (ref 12.0–15.0)
Lymphocytes Relative: 61 % — ABNORMAL HIGH (ref 12–46)
Lymphs Abs: 2.3 10*3/uL (ref 0.7–4.0)
MCH: 32.1 pg (ref 26.0–34.0)
MCHC: 34.7 g/dL (ref 30.0–36.0)
MCV: 92.6 fL (ref 78.0–100.0)
Monocytes Absolute: 0.2 10*3/uL (ref 0.1–1.0)
Monocytes Relative: 6 % (ref 3–12)
Neutro Abs: 1.2 10*3/uL — ABNORMAL LOW (ref 1.7–7.7)
Neutrophils Relative %: 32 % — ABNORMAL LOW (ref 43–77)
Platelets: 137 10*3/uL — ABNORMAL LOW (ref 150–400)
RBC: 3.49 MIL/uL — ABNORMAL LOW (ref 3.87–5.11)
RDW: 11.5 % (ref 11.5–15.5)
WBC: 3.7 10*3/uL — ABNORMAL LOW (ref 4.0–10.5)

## 2013-09-26 MED ORDER — ONDANSETRON HCL 8 MG PO TABS: 8.0000 mg | ORAL_TABLET | Freq: Three times a day (TID) | ORAL | Status: DC | PRN

## 2013-09-26 MED ORDER — PRENATAL MULTIVITAMIN CH: 1.0000 | ORAL_TABLET | Freq: Every day | ORAL | Status: DC

## 2013-09-26 MED ORDER — OXYCODONE-ACETAMINOPHEN 5-325 MG PO TABS: 1.0000 | ORAL_TABLET | ORAL | Status: DC | PRN

## 2013-09-26 MED ORDER — CEPHALEXIN 500 MG PO CAPS: 500.0000 mg | ORAL_CAPSULE | Freq: Four times a day (QID) | ORAL | Status: DC

## 2013-09-26 MED ORDER — OXYCODONE HCL 5 MG PO TABS: 5.0000 mg | ORAL_TABLET | ORAL | Status: DC | PRN

## 2013-09-26 NOTE — Plan of Care (Signed)
Problem: Phase II Progression Outcomes Goal: Progress activity as tolerated unless otherwise ordered Outcome: Completed/Met Date Met:  09/26/13 Ambulates in room without difficulty. Goal: Discharge plan established Outcome: Completed/Met Date Met:  09/26/13 VSS Pain controlled Infection resolving Tolerating diet BM Understands when to call MD  Problem: Phase III Progression Outcomes Goal: Pain controlled on oral analgesia Outcome: Completed/Met Date Met:  09/26/13 Pain control better on Oxy IR.

## 2013-09-26 NOTE — Discharge Summary (Addendum)
Physician Discharge Summary  Patient ID: Victoria Alvarez MRN: [id number] DOB/AGE: May 23, 1990 23 y.o.  Admit date: 09/24/2013 Discharge date: 09/26/2013  Admission Diagnoses: pyelonephritis in first trimester  Discharge Diagnoses:  Right flank pain in pregnancy Mild Hyperemesis in pregnancy   Discharged Condition: good  Hospital Course: Pt was admitted on transfer from [facility] ED with a presumed diagnosis of pyelonephritis in pregnancy.  Pt has been afebrile with a normal WBC throughtout her stay. She complains of severe right side pain. She denies injury to the area but c/o pain with inspiration.  She denies f/c at home.  She reports that she came to the  hosp dou to the nausea and emesis that she had in her first pregnancy as well.  Pt had an unremarkable stay outside of pain, which was controlled with meds.  She reports that she wants to get her care here as her first pregnancy was complicated and this is where she wants to deliver.      Consults: None  Significant Diagnostic Studies: labs: CBC, UA & urine cx  and renal sono  Treatments: IV hydration and antibiotics: ceftriaxone  Discharge Exam: Blood pressure 99/54, pulse 51, temperature 97.8 F (36.6 C), temperature source Oral, resp. rate 16, height 5' 2"  (1.575 m), weight 179 lb 4 oz (81.307 kg), last menstrual period 09/24/2013, SpO2 97.00%. General appearance: alert and no distress Resp: clear to auscultation bilaterally GI: soft, non-tender; bowel sounds normal; no masses,  no organomegaly right side pain.  This appears to be over the rib cage and does not correlate wiht CVAT.  09/25/2013 CLINICAL DATA: Abdominal pain and back pain. Known pyelonephritis.  EXAM:  RENAL/URINARY TRACT ULTRASOUND COMPLETE  COMPARISON: CT of the abdomen and pelvis performed 07/17/2013  FINDINGS:  Right Kidney:  Length: 11.6 cm. Echogenicity within normal limits. No mass or  hydronephrosis visualized.  Left Kidney:  Length: 10.5 cm. Echogenicity  within normal limits. No mass or  hydronephrosis visualized.  Bladder:  Appears normal for degree of bladder distention.  IMPRESSION:  Unremarkable renal ultrasound.   CBC    Component Value Date/Time   WBC 3.7* 09/26/2013 0530   RBC 3.49* 09/26/2013 0530   HGB 11.2* 09/26/2013 0530   HCT 32.3* 09/26/2013 0530   PLT 137* 09/26/2013 0530   MCV 92.6 09/26/2013 0530   MCH 32.1 09/26/2013 0530   MCHC 34.7 09/26/2013 0530   RDW 11.5 09/26/2013 0530   LYMPHSABS 2.3 09/26/2013 0530   MONOABS 0.2 09/26/2013 0530   EOSABS 0.1 09/26/2013 0530   BASOSABS 0.0 09/26/2013 0530     Disposition: 01-Home or Self Care      Discharge Instructions   Discharge activity:  No Restrictions    Complete by:  As directed      Discharge diet:  No restrictions    Complete by:  As directed      No sexual activity restrictions    Complete by:  As directed             Medication List    STOP taking these medications       Oxycodone HCl 10 MG Tabs     sulfamethoxazole-trimethoprim 800-160 MG per tablet  Commonly known as:  BACTRIM DS      TAKE these medications       cephALEXin 500 MG capsule  Commonly known as:  KEFLEX  Take 1 capsule (500 mg total) by mouth 4 (four) times daily.     ondansetron 8 MG tablet  Commonly known  as:  ZOFRAN  Take 1 tablet (8 mg total) by mouth every 8 (eight) hours as needed for nausea or vomiting.     prenatal multivitamin Tabs tablet  Take 1 tablet by mouth daily at 12 noon.       Follow-up Information   Follow up with [facility] In 2 weeks.   Contact information:   [place] [address]      Pt encouraged to f/u if she develops fever or chills or if her n/v is not controlled with her meds Signed: HARRAWAY-SMITH, Virl Coble 09/26/2013, 8:58 AM

## 2013-09-26 NOTE — Discharge Instructions (Signed)
Pregnancy and Urinary Tract Infection A urinary tract infection (UTI) is a bacterial infection of the urinary tract. Infection of the urinary tract can include the ureters, kidneys (pyelonephritis), bladder (cystitis), and urethra (urethritis). All pregnant women should be screened for bacteria in the urinary tract. Identifying and treating a UTI will decrease the risk of preterm labor and developing more serious infections in both the mother and baby. CAUSES Bacteria germs cause almost all UTIs.  RISK FACTORS Many factors can increase your chances of getting a UTI during pregnancy. These include:  Having a short urethra.  Poor toilet and hygiene habits.  Sexual intercourse.  Blockage of urine along the urinary tract.  Problems with the pelvic muscles or nerves.  Diabetes.  Obesity.  Bladder problems after having several children.  Previous history of UTI. SIGNS AND SYMPTOMS   Pain, burning, or a stinging feeling when urinating.  Suddenly feeling the need to urinate right away (urgency).  Loss of bladder control (urinary incontinence).  Frequent urination, more than is common with pregnancy.  Lower abdominal or back discomfort.  Cloudy urine.  Blood in the urine (hematuria).  Fever. When the kidneys are infected, the symptoms may be:  Back pain.  Flank pain on the right side more so than the left.  Fever.  Chills.  Nausea.  Vomiting. DIAGNOSIS  A urinary tract infection is usually diagnosed through urine tests. Additional tests and procedures are sometimes done. These may include:  Ultrasound exam of the kidneys, ureters, bladder, and urethra.  Looking in the bladder with a lighted tube (cystoscopy). TREATMENT Typically, UTIs can be treated with antibiotic medicines.  HOME CARE INSTRUCTIONS   Only take over-the-counter or prescription medicines as directed by your health care provider. If you were prescribed antibiotics, take them as directed. Finish  them even if you start to feel better.  Drink enough fluids to keep your urine clear or pale yellow.  Do not have sexual intercourse until the infection is gone and your health care provider says it is okay.  Make sure you are tested for UTIs throughout your pregnancy. These infections often come back. Preventing a UTI in the Future  Practice good toilet habits. Always wipe from front to back. Use the tissue only once.  Do not hold your urine. Empty your bladder as soon as possible when the urge comes.  Do not douche or use deodorant sprays.  Wash with soap and warm water around the genital area and the anus.  Empty your bladder before and after sexual intercourse.  Wear underwear with a cotton crotch.  Avoid caffeine and carbonated drinks. They can irritate the bladder.  Drink cranberry juice or take cranberry pills. This may decrease the risk of getting a UTI.  Do not drink alcohol.  Keep all your appointments and tests as scheduled. SEEK MEDICAL CARE IF:   Your symptoms get worse.  You are still having fevers 2 or more days after treatment begins.  You have a rash.  You feel that you are having problems with medicines prescribed.  You have abnormal vaginal discharge. SEEK IMMEDIATE MEDICAL CARE IF:   You have back or flank pain.  You have chills.  You have blood in your urine.  You have nausea and vomiting.  You have contractions of your uterus.  You have a gush of fluid from the vagina. MAKE SURE YOU:  Understand these instructions.   Will watch your condition.   Will get help right away if you are not doing  well or get worse.  Document Released: 06/22/2010 Document Revised: 12/16/2012 Document Reviewed: 09/24/2012 [facility] Patient Information 2015 [place], [place]. This information is not intended to replace advice given to you by your health care provider. Make sure you discuss any questions you have with your health care provider.

## 2013-09-26 NOTE — Progress Notes (Signed)
Discharge instructions reviewed with patient and significant other.  Both state understanding of home care, activity, medications, signs/symptoms to report to MD and return MD office visit.  No home equipment needed and significant other will assist with patients care @ home.  Patient ambulated in stable condition with staff  For discharge without incident.

## 2013-10-03 ENCOUNTER — Inpatient Hospital Stay (HOSPITAL_COMMUNITY)
Admission: AD | Admit: 2013-10-03 | Discharge: 2013-10-03 | Disposition: A | Discharge status: Home / Self Care | Payer: Medicaid Other | Source: Ambulatory Visit | Attending: Obstetrics & Gynecology | Admitting: Obstetrics & Gynecology

## 2013-10-03 ENCOUNTER — Encounter (HOSPITAL_COMMUNITY): Payer: Self-pay | Admitting: *Deleted

## 2013-10-03 ENCOUNTER — Inpatient Hospital Stay (HOSPITAL_COMMUNITY): Payer: Medicaid Other

## 2013-10-03 DIAGNOSIS — O99891 Other specified diseases and conditions complicating pregnancy: Secondary | ICD-10-CM | POA: Insufficient documentation

## 2013-10-03 DIAGNOSIS — K529 Noninfective gastroenteritis and colitis, unspecified: Secondary | ICD-10-CM

## 2013-10-03 DIAGNOSIS — K5289 Other specified noninfective gastroenteritis and colitis: Secondary | ICD-10-CM | POA: Diagnosis not present

## 2013-10-03 DIAGNOSIS — O99330 Smoking (tobacco) complicating pregnancy, unspecified trimester: Secondary | ICD-10-CM | POA: Diagnosis not present

## 2013-10-03 DIAGNOSIS — O9989 Other specified diseases and conditions complicating pregnancy, childbirth and the puerperium: Secondary | ICD-10-CM

## 2013-10-03 DIAGNOSIS — R1032 Left lower quadrant pain: Secondary | ICD-10-CM | POA: Insufficient documentation

## 2013-10-03 DIAGNOSIS — O210 Mild hyperemesis gravidarum: Secondary | ICD-10-CM | POA: Insufficient documentation

## 2013-10-03 DIAGNOSIS — O26899 Other specified pregnancy related conditions, unspecified trimester: Secondary | ICD-10-CM

## 2013-10-03 HISTORY — DX: Headache: R51

## 2013-10-03 HISTORY — DX: Infections of kidney in pregnancy, first trimester: O23.01

## 2013-10-03 LAB — URINALYSIS, ROUTINE W REFLEX MICROSCOPIC
Bilirubin Urine: NEGATIVE
Glucose, UA: NEGATIVE mg/dL
Hgb urine dipstick: NEGATIVE
Ketones, ur: NEGATIVE mg/dL
Leukocytes, UA: NEGATIVE
Nitrite: NEGATIVE
Protein, ur: NEGATIVE mg/dL
Specific Gravity, Urine: 1.01 (ref 1.005–1.030)
Urobilinogen, UA: 0.2 mg/dL (ref 0.0–1.0)
pH: 7.5 (ref 5.0–8.0)

## 2013-10-03 LAB — CBC
HCT: 37.6 % (ref 36.0–46.0)
Hemoglobin: 13.8 g/dL (ref 12.0–15.0)
MCH: 33.2 pg (ref 26.0–34.0)
MCHC: 36.7 g/dL — ABNORMAL HIGH (ref 30.0–36.0)
MCV: 90.4 fL (ref 78.0–100.0)
Platelets: 184 10*3/uL (ref 150–400)
RBC: 4.16 MIL/uL (ref 3.87–5.11)
RDW: 11.4 % — ABNORMAL LOW (ref 11.5–15.5)
WBC: 6.3 10*3/uL (ref 4.0–10.5)

## 2013-10-03 LAB — COMPREHENSIVE METABOLIC PANEL
ALT: 14 U/L (ref 0–35)
AST: 11 U/L (ref 0–37)
Albumin: 3.9 g/dL (ref 3.5–5.2)
Alkaline Phosphatase: 32 U/L — ABNORMAL LOW (ref 39–117)
Anion gap: 13 (ref 5–15)
BUN: 5 mg/dL — ABNORMAL LOW (ref 6–23)
CO2: 21 mEq/L (ref 19–32)
Calcium: 9.7 mg/dL (ref 8.4–10.5)
Chloride: 103 mEq/L (ref 96–112)
Creatinine, Ser: 0.52 mg/dL (ref 0.50–1.10)
GFR calc Af Amer: >90 mL/min (ref >90)
GFR calc non Af Amer: >90 mL/min (ref >90)
Glucose, Bld: 109 mg/dL — ABNORMAL HIGH (ref 70–99)
Potassium: 3.7 mEq/L (ref 3.7–5.3)
Sodium: 137 mEq/L (ref 137–147)
Total Bilirubin: 1.9 mg/dL — ABNORMAL HIGH (ref 0.3–1.2)
Total Protein: 6.1 g/dL (ref 6.0–8.3)

## 2013-10-03 MED ORDER — PROMETHAZINE HCL 25 MG/ML IJ SOLN
25.0000 mg | Freq: Once | INTRAMUSCULAR | Status: AC
  Administered 2013-10-03: 25 mg via INTRAMUSCULAR
  Filled 2013-10-03: qty 1

## 2013-10-03 MED ORDER — HYDROMORPHONE HCL PF 2 MG/ML IJ SOLN
2.0000 mg | Freq: Once | INTRAMUSCULAR | Status: AC
  Administered 2013-10-03: 2 mg via INTRAMUSCULAR
  Filled 2013-10-03: qty 1

## 2013-10-03 MED ORDER — PROMETHAZINE HCL 25 MG PO TABS: 25.0000 mg | ORAL_TABLET | Freq: Four times a day (QID) | ORAL | Status: DC | PRN

## 2013-10-03 MED ORDER — OXYCODONE-ACETAMINOPHEN 5-325 MG PO TABS: 1.0000 | ORAL_TABLET | ORAL | Status: DC | PRN

## 2013-10-03 NOTE — MAU Provider Note (Signed)
Chief Complaint: No chief complaint on file.   First Provider Initiated Contact with Patient 10/03/13 0919      SUBJECTIVE HPI: Victoria Alvarez is a 23 y.o. G3P1011 at [redacted]w[redacted]d by LMP who presents by EMS with severe left lower quadrant pain, worsening nausea and vomiting, low-grade fever and spotting x1-2 days. Some blood streaks in her vomit. Taking Tylenol and ibuprofen without relief of pain. He vomited 3 times since 9 PM. Able to keep down fluids.  Treated for right pyelonephritis 09/24/2013, but urine culture negative. Reports constipation, but took MiraLAX last week with good results. Last bowel movement yesterday.  Past Medical History  Diagnosis Date  . Interstitial cystitis   . Chronic pelvic pain in female   . Headache(784.0)   . Heart murmur     as child  . Infection     UTI  . Depression     denies 07/26  . Pyelonephritis affecting pregnancy in first trimester July 2015   OB History  Gravida Para Term Preterm AB SAB TAB Ectopic Multiple Living  3 1 1  0 1 1 0 0 0 1    # Outcome Date GA Lbr Len/2nd Weight Sex Delivery Anes PTL Lv  3 CUR           2 SAB           1 TRM              Past Surgical History  Procedure Laterality Date  . Hernia repair    . 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 Marital Status: Single    Spouse Name: N/A    Number of Children: N/A  . Years of Education: N/A   Occupational History  . Not on file.   Social History Main Topics  . Smoking status: Current Every Day Smoker -- 8 years    Types: Cigarettes  . Smokeless tobacco: Not on file     Comment: cutting back  . Alcohol Use: No  . Drug Use: No  . Sexual Activity: Not on file   Other Topics Concern  . Not on file   Social History Narrative  . No narrative on file   No current facility-administered medications on file prior to encounter.   Current Outpatient Prescriptions on File Prior to Encounter  Medication Sig  Dispense Refill  . ondansetron (ZOFRAN) 8 MG tablet Take 1 tablet (8 mg total) by mouth every 8 (eight) hours as needed for nausea or vomiting.  30 tablet  0  . Prenatal Vit-Fe Fumarate-FA (PRENATAL MULTIVITAMIN) TABS tablet Take 1 tablet by mouth daily at 12 noon.  30 tablet  3   Allergies  Allergen Reactions  . Ciprofloxacin Diarrhea and Nausea And Vomiting    ROS: Pertinent positive items in HPI. Negative for chills, diarrhea, passage of tissue, vaginal discharge, vaginal odor, vaginal itching, dysuria, urgency, frequency, flank pain, hematuria, dyspareunia.  OBJECTIVE Blood pressure 103/44, pulse 59, temperature 98.3 F (36.8 C), temperature source Oral, resp. rate 20, last menstrual period 09/24/2013, SpO2 100.00%. GENERAL: Well-developed, well-nourished female in moderate distress. Tearful.  HEENT: Normocephalic HEART: normal rate RESP: normal effort ABDOMEN: Mildly distended, generalized tenderness greater in LLQ, suprapubic, and RUQ areas. Pain seemed to move throughout exam. Negative CVA tenderness. Positive bowel sounds x4. EXTREMITIES: Nontender, no edema NEURO: Alert and oriented SPECULUM EXAM: NEFG, physiologic discharge, no blood noted, cervix clean BIMANUAL: cervix  closed; uterus 8-10 week size, no adnexal tenderness or masses  LAB RESULTS Results for orders placed during the hospital encounter of 10/03/13 (from the past 24 hour(s))  URINALYSIS, ROUTINE W REFLEX MICROSCOPIC     Status: None   Collection Time    10/03/13  8:48 AM      Result Value Ref Range   Color, Urine YELLOW  YELLOW   APPearance CLEAR  CLEAR   Specific Gravity, Urine 1.010  1.005 - 1.030   pH 7.5  5.0 - 8.0   Glucose, UA NEGATIVE  NEGATIVE mg/dL   Hgb urine dipstick NEGATIVE  NEGATIVE   Bilirubin Urine NEGATIVE  NEGATIVE   Ketones, ur NEGATIVE  NEGATIVE mg/dL   Protein, ur NEGATIVE  NEGATIVE mg/dL   Urobilinogen, UA 0.2  0.0 - 1.0 mg/dL   Nitrite NEGATIVE  NEGATIVE   Leukocytes, UA NEGATIVE   NEGATIVE  CBC     Status: Abnormal   Collection Time    10/03/13  9:10 AM      Result Value Ref Range   WBC 6.3  4.0 - 10.5 K/uL   RBC 4.16  3.87 - 5.11 MIL/uL   Hemoglobin 13.8  12.0 - 15.0 g/dL   HCT 27.0  62.3 - 76.2 %   MCV 90.4  78.0 - 100.0 fL   MCH 33.2  26.0 - 34.0 pg   MCHC 36.7 (*) 30.0 - 36.0 g/dL   RDW 83.1 (*) 51.7 - 61.6 %   Platelets 184  150 - 400 K/uL  COMPREHENSIVE METABOLIC PANEL     Status: Abnormal   Collection Time    10/03/13 11:00 AM      Result Value Ref Range   Sodium 137  137 - 147 mEq/L   Potassium 3.7  3.7 - 5.3 mEq/L   Chloride 103  96 - 112 mEq/L   CO2 21  19 - 32 mEq/L   Glucose, Bld 109 (*) 70 - 99 mg/dL   BUN 5 (*) 6 - 23 mg/dL   Creatinine, Ser 0.73  0.50 - 1.10 mg/dL   Calcium 9.7  8.4 - 71.0 mg/dL   Total Protein 6.1  6.0 - 8.3 g/dL   Albumin 3.9  3.5 - 5.2 g/dL   AST 11  0 - 37 U/L   ALT 14  0 - 35 U/L   Alkaline Phosphatase 32 (*) 39 - 117 U/L   Total Bilirubin 1.9 (*) 0.3 - 1.2 mg/dL   GFR calc non Af Amer >90  >90 mL/min   GFR calc Af Amer >90  >90 mL/min   Anion gap 13  5 - 15    IMAGING 10/03/2013   ADDENDUM REPORT: 10/03/2013 12:15  ADDENDUM: Maternal ovaries appear within normal limits bilaterally.   Electronically Signed   By: Bretta Bang M.D.   On: 10/03/2013 12:15   10/03/2013   CLINICAL DATA:  Pelvic  EXAM: TRANSVAGINAL OB ULTRASOUND  TECHNIQUE: Transvaginal ultrasound was performed for complete evaluation of the gestation as well as the maternal uterus, adnexal regions, and pelvic cul-de-sac.  COMPARISON:  None.  FINDINGS: Intrauterine gestational sac: Visualized/normal in shape.  Yolk sac:  Visualized  Embryo:  Visualized  Cardiac Activity: Visualized  Heart Rate: 175 bpm  CRL:   23  mm   9 w 0 d                  [place] EDC: May 08, 2013  Maternal uterus/adnexae: A thin membrane surrounds the fetus.  There is a lower uterine segment subchorionic hemorrhage measuring 1.6 x 0.9 x 2.1 cm. Cervical os is closed. Maternal  adnexal structures appear normal bilaterally. No appreciable free pelvic fluid.  IMPRESSION: Single live intrauterine gestation with estimated gestational age of [redacted] weeks. Small lower uterine segment subchorionic hemorrhage. Study otherwise unremarkable.  Electronically Signed: By: Bretta BangWilliam  Woodruff M.D. On: 10/03/2013 11:50   MAU COURSE CBC, CMP, Phenergan, Dilaudid, UA, pelvic ultrasound.  Patient feeling much better. No vomiting while in maternity admissions. Upon further discussion constipation has been a major issue in this and previous pregnancies. Recommends using MiraLAX daily for the next 4 days, then when necessary. Use Zofran and Percocet sparingly as these may worsen constipation.  ASSESSMENT 1. Pregnancy with abdominal pain of left lower quadrant, antepartum   2. Gastroenteritis, acute     PLAN Discharge home in stable condition. Increase fluid and fiber. Pelvic rest until pain resolves.     Follow-up Information   Follow up with [facility] On 10/11/2013. (As scheduled)    Contact information:   [address] [place] [id number]       [facility]. (As needed if symptoms worsen)    Contact information:   [address] [id number] [place] [address] [phone]       Medication List         ondansetron 8 MG tablet  Commonly known as:  ZOFRAN  Take 1 tablet (8 mg total) by mouth every 8 (eight) hours as needed for nausea or vomiting.     oxyCODONE-acetaminophen 5-325 MG per tablet  Commonly known as:  PERCOCET/ROXICET  Take 1 tablet by mouth every 4 (four) hours as needed.     prenatal multivitamin Tabs tablet  Take 1 tablet by mouth daily at 12 noon.     promethazine 25 MG tablet  Commonly known as:  PHENERGAN  Take 1 tablet (25 mg total) by mouth every 6 (six) hours as needed for nausea or vomiting.       OgilvieVirginia Harsha Yusko, [place] 10/03/2013  12:34 PM

## 2013-10-03 NOTE — MAU Note (Signed)
ems arrival, LLQ and vomiting blood. G3P2; "blood between placenta and uterus"

## 2013-10-03 NOTE — MAU Note (Signed)
recent adm for pyelo. Asked her how she was doing, "I'm just so tired of hurting and feeling sick, I just want to sleep"

## 2013-10-03 NOTE — Discharge Instructions (Signed)
Abdominal Pain During Pregnancy Abdominal pain is common in pregnancy. Most of the time, it does not cause harm. There are many causes of abdominal pain. Some causes are more serious than others. Some of the causes of abdominal pain in pregnancy are easily diagnosed. Occasionally, the diagnosis takes time to understand. Other times, the cause is not determined. Abdominal pain can be a sign that something is very wrong with the pregnancy, or the pain may have nothing to do with the pregnancy at all. For this reason, always tell your health care provider if you have any abdominal discomfort. HOME CARE INSTRUCTIONS  Monitor your abdominal pain for any changes. The following actions may help to alleviate any discomfort you are experiencing:  Do not have sexual intercourse or put anything in your vagina until your symptoms go away completely.  Get plenty of rest until your pain improves.  Drink clear fluids if you feel nauseous. Avoid solid food as long as you are uncomfortable or nauseous.  Only take over-the-counter or prescription medicine as directed by your health care provider.  Keep all follow-up appointments with your health care provider. SEEK IMMEDIATE MEDICAL CARE IF:  You are bleeding, leaking fluid, or passing tissue from the vagina.  You have increasing pain or cramping.  You have persistent vomiting.  You have painful or bloody urination.  You have a fever.  You notice a decrease in your baby's movements.  You have extreme weakness or feel faint.  You have shortness of breath, with or without abdominal pain.  You develop a severe headache with abdominal pain.  You have abnormal vaginal discharge with abdominal pain.  You have persistent diarrhea.  You have abdominal pain that continues even after rest, or gets worse. MAKE SURE YOU:   Understand these instructions.  Will watch your condition.  Will get help right away if you are not doing well or get  worse. Document Released: 02/25/2005 Document Revised: 12/16/2012 Document Reviewed: 09/24/2012 [facility] Patient Information 2015 [place], [place]. This information is not intended to replace advice given to you by your health care provider. Make sure you discuss any questions you have with your health care provider.  Viral Gastroenteritis Viral gastroenteritis is also known as stomach flu. This condition affects the stomach and intestinal tract. It can cause sudden diarrhea and vomiting. The illness typically lasts 3 to 8 days. Most people develop an immune response that eventually gets rid of the virus. While this natural response develops, the virus can make you quite ill. CAUSES  Many different viruses can cause gastroenteritis, such as rotavirus or noroviruses. You can catch one of these viruses by consuming contaminated food or water. You may also catch a virus by sharing utensils or other personal items with an infected person or by touching a contaminated surface. SYMPTOMS  The most common symptoms are diarrhea and vomiting. These problems can cause a severe loss of body fluids (dehydration) and a body salt (electrolyte) imbalance. Other symptoms may include:  Fever.  Headache.  Fatigue.  Abdominal pain. DIAGNOSIS  Your caregiver can usually diagnose viral gastroenteritis based on your symptoms and a physical exam. A stool sample may also be taken to test for the presence of viruses or other infections. TREATMENT  This illness typically goes away on its own. Treatments are aimed at rehydration. The most serious cases of viral gastroenteritis involve vomiting so severely that you are not able to keep fluids down. In these cases, fluids must be given through an intravenous line (  IV). HOME CARE INSTRUCTIONS   Drink enough fluids to keep your urine clear or pale yellow. Drink small amounts of fluids frequently and increase the amounts as tolerated.  Ask your caregiver for specific  rehydration instructions.  Avoid:  Foods high in sugar.  Alcohol.  Carbonated drinks.  Tobacco.  Juice.  Caffeine drinks.  Extremely hot or cold fluids.  Fatty, greasy foods.  Too much intake of anything at one time.  Dairy products until 24 to 48 hours after diarrhea stops.  You may consume probiotics. Probiotics are active cultures of beneficial bacteria. They may lessen the amount and number of diarrheal stools in adults. Probiotics can be found in yogurt with active cultures and in supplements.  Wash your hands well to avoid spreading the virus.  Only take over-the-counter or prescription medicines for pain, discomfort, or fever as directed by your caregiver. Do not give aspirin to children. Antidiarrheal medicines are not recommended.  Ask your caregiver if you should continue to take your regular prescribed and over-the-counter medicines.  Keep all follow-up appointments as directed by your caregiver. SEEK IMMEDIATE MEDICAL CARE IF:   You are unable to keep fluids down.  You do not urinate at least once every 6 to 8 hours.  You develop shortness of breath.  You notice blood in your stool or vomit. This may look like coffee grounds.  You have abdominal pain that increases or is concentrated in one small area (localized).  You have persistent vomiting or diarrhea.  You have a fever.  The patient is a child younger than 3 months, and he or she has a fever.  The patient is a child older than 3 months, and he or she has a fever and persistent symptoms.  The patient is a child older than 3 months, and he or she has a fever and symptoms suddenly get worse.  The patient is a baby, and he or she has no tears when crying. MAKE SURE YOU:   Understand these instructions.  Will watch your condition.  Will get help right away if you are not doing well or get worse. Document Released: 02/25/2005 Document Revised: 05/20/2011 Document Reviewed:  12/12/2010 [facility] Patient Information 2015 [place]. This information is not intended to replace advice given to you by your health care provider. Make sure you discuss any questions you have with your health care provider.

## 2013-10-11 ENCOUNTER — Encounter: Payer: Medicaid Other | Admitting: Obstetrics & Gynecology

## 2013-10-26 ENCOUNTER — Encounter (HOSPITAL_COMMUNITY): Payer: Self-pay | Admitting: Emergency Medicine

## 2013-10-26 DIAGNOSIS — Z8249 Family history of ischemic heart disease and other diseases of the circulatory system: Secondary | ICD-10-CM

## 2013-10-26 DIAGNOSIS — N12 Tubulo-interstitial nephritis, not specified as acute or chronic: Secondary | ICD-10-CM | POA: Diagnosis present

## 2013-10-26 DIAGNOSIS — O99330 Smoking (tobacco) complicating pregnancy, unspecified trimester: Secondary | ICD-10-CM | POA: Diagnosis present

## 2013-10-26 DIAGNOSIS — O2390 Unspecified genitourinary tract infection in pregnancy, unspecified trimester: Principal | ICD-10-CM | POA: Diagnosis present

## 2013-10-26 DIAGNOSIS — Z823 Family history of stroke: Secondary | ICD-10-CM

## 2013-10-26 DIAGNOSIS — Z833 Family history of diabetes mellitus: Secondary | ICD-10-CM

## 2013-10-26 DIAGNOSIS — O210 Mild hyperemesis gravidarum: Secondary | ICD-10-CM | POA: Diagnosis present

## 2013-10-26 LAB — URINE MICROSCOPIC-ADD ON

## 2013-10-26 LAB — URINALYSIS, ROUTINE W REFLEX MICROSCOPIC
Glucose, UA: NEGATIVE mg/dL
Hgb urine dipstick: NEGATIVE
Ketones, ur: NEGATIVE mg/dL
Nitrite: NEGATIVE
Protein, ur: NEGATIVE mg/dL
Specific Gravity, Urine: 1.023 (ref 1.005–1.030)
Urobilinogen, UA: 1 mg/dL (ref 0.0–1.0)
pH: 6 (ref 5.0–8.0)

## 2013-10-26 LAB — POC URINE PREG, ED: Preg Test, Ur: POSITIVE — AB

## 2013-10-26 NOTE — ED Notes (Addendum)
PT reports she is [redacted] weeks pregnant; been on bactrim for UTI but was told to get off it due to being pregnant. Was supposed to follow up with gynocologist. Refuses to be seen at [facility]. Here bc states she feels worse. PT reports scant bleeding. Call MD and told she had placenta previa so bleeding probably due to that. Mild abdominal cramping reported.

## 2013-10-27 ENCOUNTER — Emergency Department (HOSPITAL_COMMUNITY): Payer: Medicaid Other

## 2013-10-27 ENCOUNTER — Inpatient Hospital Stay (HOSPITAL_COMMUNITY)
Admission: EM | Admit: 2013-10-27 | Discharge: 2013-10-28 | DRG: 781 | Disposition: A | Discharge status: Home / Self Care | Payer: Medicaid Other | Attending: Family Medicine | Admitting: Family Medicine

## 2013-10-27 ENCOUNTER — Encounter: Payer: Medicaid Other | Admitting: Obstetrics & Gynecology

## 2013-10-27 ENCOUNTER — Encounter (HOSPITAL_COMMUNITY): Payer: Self-pay | Admitting: *Deleted

## 2013-10-27 DIAGNOSIS — Z823 Family history of stroke: Secondary | ICD-10-CM | POA: Diagnosis not present

## 2013-10-27 DIAGNOSIS — N12 Tubulo-interstitial nephritis, not specified as acute or chronic: Secondary | ICD-10-CM | POA: Diagnosis present

## 2013-10-27 DIAGNOSIS — M549 Dorsalgia, unspecified: Secondary | ICD-10-CM | POA: Diagnosis not present

## 2013-10-27 DIAGNOSIS — N3000 Acute cystitis without hematuria: Secondary | ICD-10-CM

## 2013-10-27 DIAGNOSIS — O99330 Smoking (tobacco) complicating pregnancy, unspecified trimester: Secondary | ICD-10-CM | POA: Diagnosis present

## 2013-10-27 DIAGNOSIS — Z8249 Family history of ischemic heart disease and other diseases of the circulatory system: Secondary | ICD-10-CM | POA: Diagnosis not present

## 2013-10-27 DIAGNOSIS — O2390 Unspecified genitourinary tract infection in pregnancy, unspecified trimester: Secondary | ICD-10-CM | POA: Diagnosis not present

## 2013-10-27 DIAGNOSIS — Z3490 Encounter for supervision of normal pregnancy, unspecified, unspecified trimester: Secondary | ICD-10-CM

## 2013-10-27 DIAGNOSIS — O210 Mild hyperemesis gravidarum: Secondary | ICD-10-CM | POA: Diagnosis present

## 2013-10-27 DIAGNOSIS — N730 Acute parametritis and pelvic cellulitis: Secondary | ICD-10-CM

## 2013-10-27 DIAGNOSIS — Z833 Family history of diabetes mellitus: Secondary | ICD-10-CM | POA: Diagnosis not present

## 2013-10-27 DIAGNOSIS — O2301 Infections of kidney in pregnancy, first trimester: Secondary | ICD-10-CM | POA: Diagnosis present

## 2013-10-27 LAB — CBC WITH DIFFERENTIAL/PLATELET
Basophils Absolute: 0 10*3/uL (ref 0.0–0.1)
Basophils Relative: 0 % (ref 0–1)
Eosinophils Absolute: 0.2 10*3/uL (ref 0.0–0.7)
Eosinophils Relative: 2 % (ref 0–5)
HCT: 39.6 % (ref 36.0–46.0)
Hemoglobin: 14.2 g/dL (ref 12.0–15.0)
Lymphocytes Relative: 40 % (ref 12–46)
Lymphs Abs: 2.5 10*3/uL (ref 0.7–4.0)
MCH: 33 pg (ref 26.0–34.0)
MCHC: 35.9 g/dL (ref 30.0–36.0)
MCV: 92.1 fL (ref 78.0–100.0)
Monocytes Absolute: 0.3 10*3/uL (ref 0.1–1.0)
Monocytes Relative: 5 % (ref 3–12)
Neutro Abs: 3.3 10*3/uL (ref 1.7–7.7)
Neutrophils Relative %: 53 % (ref 43–77)
Platelets: 182 10*3/uL (ref 150–400)
RBC: 4.3 MIL/uL (ref 3.87–5.11)
RDW: 11.8 % (ref 11.5–15.5)
WBC: 6.2 10*3/uL (ref 4.0–10.5)

## 2013-10-27 LAB — COMPREHENSIVE METABOLIC PANEL
ALT: 8 U/L (ref 0–35)
AST: 12 U/L (ref 0–37)
Albumin: 4.1 g/dL (ref 3.5–5.2)
Alkaline Phosphatase: 40 U/L (ref 39–117)
Anion gap: 15 (ref 5–15)
BUN: 7 mg/dL (ref 6–23)
CO2: 19 mEq/L (ref 19–32)
Calcium: 9.4 mg/dL (ref 8.4–10.5)
Chloride: 101 mEq/L (ref 96–112)
Creatinine, Ser: 0.52 mg/dL (ref 0.50–1.10)
GFR calc Af Amer: >90 mL/min (ref >90)
GFR calc non Af Amer: >90 mL/min (ref >90)
Glucose, Bld: 85 mg/dL (ref 70–99)
Potassium: 3.5 mEq/L — ABNORMAL LOW (ref 3.7–5.3)
Sodium: 135 mEq/L — ABNORMAL LOW (ref 137–147)
Total Bilirubin: 1 mg/dL (ref 0.3–1.2)
Total Protein: 6.4 g/dL (ref 6.0–8.3)

## 2013-10-27 LAB — HCG, QUANTITATIVE, PREGNANCY: hCG, Beta Chain, Quant, S: 31748 m[IU]/mL — ABNORMAL HIGH (ref <5)

## 2013-10-27 LAB — ABO/RH
ABO/RH(D): O NEG
Antibody Screen: NEGATIVE

## 2013-10-27 LAB — WET PREP, GENITAL
Trich, Wet Prep: NONE SEEN
Yeast Wet Prep HPF POC: NONE SEEN

## 2013-10-27 MED ORDER — SODIUM CHLORIDE 0.9 % IV SOLN
INTRAVENOUS | Status: DC
  Administered 2013-10-27 (×2): via INTRAVENOUS
  Administered 2013-10-28: 75 mL/h via INTRAVENOUS

## 2013-10-27 MED ORDER — DEXTROSE 5 % IV SOLN
1.0000 g | Freq: Once | INTRAVENOUS | Status: AC
  Administered 2013-10-27: 1 g via INTRAVENOUS
  Filled 2013-10-27: qty 10

## 2013-10-27 MED ORDER — SODIUM CHLORIDE 0.9 % IV SOLN
INTRAVENOUS | Status: DC
  Administered 2013-10-27: 05:00:00 via INTRAVENOUS

## 2013-10-27 MED ORDER — ONDANSETRON HCL 4 MG/2ML IJ SOLN
4.0000 mg | Freq: Four times a day (QID) | INTRAMUSCULAR | Status: DC | PRN
Start: 2013-10-27 — End: 2013-10-28
  Administered 2013-10-27: 4 mg via INTRAVENOUS
  Filled 2013-10-27: qty 2

## 2013-10-27 MED ORDER — DEXTROSE 5 % IV SOLN
1.0000 g | Freq: Two times a day (BID) | INTRAVENOUS | Status: DC
  Administered 2013-10-27 – 2013-10-28 (×3): 1 g via INTRAVENOUS
  Filled 2013-10-27 (×3): qty 10

## 2013-10-27 MED ORDER — SODIUM CHLORIDE 0.9 % IV BOLUS (SEPSIS)
1000.0000 mL | Freq: Once | INTRAVENOUS | Status: AC
  Administered 2013-10-27: 1000 mL via INTRAVENOUS

## 2013-10-27 MED ORDER — ONDANSETRON HCL 4 MG PO TABS
8.0000 mg | ORAL_TABLET | Freq: Four times a day (QID) | ORAL | Status: DC | PRN
  Administered 2013-10-28: 8 mg via ORAL
  Filled 2013-10-27: qty 2

## 2013-10-27 MED ORDER — DOCUSATE SODIUM 100 MG PO CAPS
100.0000 mg | ORAL_CAPSULE | Freq: Two times a day (BID) | ORAL | Status: DC | PRN
  Filled 2013-10-27: qty 1

## 2013-10-27 MED ORDER — PRENATAL MULTIVITAMIN CH
1.0000 | ORAL_TABLET | Freq: Every day | ORAL | Status: DC
  Filled 2013-10-27: qty 1

## 2013-10-27 MED ORDER — METRONIDAZOLE 500 MG PO TABS
500.0000 mg | ORAL_TABLET | Freq: Two times a day (BID) | ORAL | Status: DC
  Administered 2013-10-27 – 2013-10-28 (×3): 500 mg via ORAL
  Filled 2013-10-27 (×3): qty 1

## 2013-10-27 MED ORDER — HYDROMORPHONE HCL PF 1 MG/ML IJ SOLN
0.2000 mg | INTRAMUSCULAR | Status: DC | PRN
  Administered 2013-10-27: 08:00:00 via INTRAVENOUS
  Administered 2013-10-27: 0.6 mg via INTRAVENOUS
  Filled 2013-10-27 (×2): qty 1

## 2013-10-27 MED ORDER — ONDANSETRON HCL 4 MG/2ML IJ SOLN
4.0000 mg | Freq: Once | INTRAMUSCULAR | Status: AC
  Administered 2013-10-27: 4 mg via INTRAVENOUS
  Filled 2013-10-27: qty 2

## 2013-10-27 MED ORDER — ACETAMINOPHEN 325 MG PO TABS
650.0000 mg | ORAL_TABLET | Freq: Once | ORAL | Status: AC
  Administered 2013-10-27: 650 mg via ORAL
  Filled 2013-10-27: qty 2

## 2013-10-27 MED ORDER — LACTATED RINGERS IV SOLN: INTRAVENOUS | Status: DC

## 2013-10-27 MED ORDER — PROMETHAZINE HCL 25 MG PO TABS
25.0000 mg | ORAL_TABLET | Freq: Four times a day (QID) | ORAL | Status: DC | PRN
  Administered 2013-10-27: 25 mg via ORAL
  Filled 2013-10-27: qty 1

## 2013-10-27 MED ORDER — ACETAMINOPHEN 500 MG PO TABS: 1000.0000 mg | ORAL_TABLET | Freq: Four times a day (QID) | ORAL | Status: DC | PRN

## 2013-10-27 MED ORDER — OXYCODONE-ACETAMINOPHEN 5-325 MG PO TABS
1.0000 | ORAL_TABLET | ORAL | Status: DC | PRN
  Administered 2013-10-27 – 2013-10-28 (×6): 2 via ORAL
  Filled 2013-10-27 (×6): qty 2

## 2013-10-27 MED ORDER — AZITHROMYCIN 250 MG PO TABS
1000.0000 mg | ORAL_TABLET | Freq: Once | ORAL | Status: AC
  Administered 2013-10-27: 1000 mg via ORAL
  Filled 2013-10-27: qty 4

## 2013-10-27 NOTE — Progress Notes (Signed)
Faculty Practice OB/GYN Attending Note  Received phone call from Dr. Ezequiel Essex regarding this patient; 23 y.o. [phone] with confirmed IUP at 29w2dwho [facility] and UA showing large LE concerning for possible pyelonephritis.  Patient also reported nausea and vomiting.  Of note, patient was observed in house for similar complaints last month (7/17 - 7/19); but had negative urine culture which ruled out pyelonephritis.  Currently, patient is afebrile, WBC is 6.2.  Wet prep shows few clue cells and many WBCs; GC/Chlam pending.  She has already received Ceftriaxone 1 g IV and Azithromycin 1000 mg; in addition to Zofran and IV fluids.  Repeat renal scan pending; the one on 09/25/13 was unremarkable.  After review of chart and speaking to Dr. RWyvonnia Dusky the decision is [facility].   Will reevaluate patient when she arrives and determine need for admission at that time.  MAU RN and Advanced Practitioner were called and made aware of this patient's transfer.    UVerita Schneiders MD, FBurnetAttending O[place] [facility]

## 2013-10-27 NOTE — ED Notes (Signed)
[facility].

## 2013-10-27 NOTE — ED Notes (Signed)
Pt reports back pain x1 month, progressively worse - states it is d/t kidney infection. Pt states she is also pregnant however does not know how many weeks pregnant. Pt also c/o vaginal discharge as well.

## 2013-10-27 NOTE — ED Notes (Signed)
Patient refusing to be placed on cardiac monitor

## 2013-10-27 NOTE — MAU Provider Note (Signed)
Attestation of Attending Supervision of Advanced Practitioner (PA/CNM/NP): Evaluation and management procedures were performed by the Advanced Practitioner under my supervision and collaboration.  I have reviewed the Advanced Practitioner's note and chart, and I agree with the management and plan.  Verita Schneiders, MD, [place] Attending [place], [facility]

## 2013-10-27 NOTE — ED Notes (Signed)
[place] verified by Lequita Halt, Consulting civil engineer.

## 2013-10-27 NOTE — Progress Notes (Signed)
Ur chart review completed.  

## 2013-10-27 NOTE — ED Notes (Signed)
Patient transported to Ultrasound 

## 2013-10-27 NOTE — ED Notes (Signed)
Attempted IV start x2 - unsuccessful - IV team paged.

## 2013-10-27 NOTE — H&P (Signed)
History   CSN: [id number]  Arrival date and time: 10/26/13 2023  First Provider Initiated Contact with Patient 10/27/13 [phone]  Chief Complaint   Patient presents with   .  Urinary Tract Infection   .  Back Pain   HPI  Victoria Alvarez is a 23 y.o. G3P1011 at 83w2dwho presents [facility]el[facility] and possible STD. The patient was admitted in July with similar symptoms and treated with IV antibiotics. She was discharged on Keflex. She states that symptoms resumed ~ 2-3 days ago. She has had bilateral flank pain and lower abdominal pain at midline. She also endorses dysuria and increased urinary frequency and urgency. She has had N/V throughout the pregnancy and states that N/V has been worse x 2 days. She takes Zofran at home with some relief. She states that she had a fever of 101 F at home 2 days ago. She has a history of interstitial cystitis and sees Dr. [place]onnamarie Poagin [place] although she has not been seen x 2-3 months. She is not currently on antibiotics and is not currently taking Phenergan previously prescribed for N/V. She receives her prenatal care with [facility].  OB History    Grav  Para  Term  Preterm  Abortions  TAB  SAB  Ect  Mult  Living    3  1  1   0  1  0  1  0  0  1      Past Medical History   Diagnosis  Date   .  Interstitial cystitis    .  Chronic pelvic pain in female    .  Headache(784.0)    .  Infection      UTI   .  Depression      denies 07/26   .  Pyelonephritis affecting pregnancy in first trimester  July 2015    Past Surgical History   Procedure  Laterality  Date   .  Hernia repair     .  Vagina surgery       tumor [place]   Family History   Problem  Relation  Age of Onset   .  Diabetes  Mother    .  Heart disease  Mother    .  Hypertension  Father    .  Diabetes  Maternal Grandmother    .  Hypertension  Maternal Grandmother    .  Stroke  Maternal Grandmother    .   Diabetes  Maternal Grandfather    .  Hypertension  Maternal Grandfather    .  Diabetes  Paternal Grandmother    .  Diabetes  Paternal Grandfather     History   Substance Use Topics   .  Smoking status:  Current Every Day Smoker -- 8 years     Types:  Cigarettes   .  Smokeless tobacco:  Not on file      Comment: cutting back   .  Alcohol Use:  No   Allergies:  Allergies   Allergen  Reactions   .  Ciprofloxacin  Diarrhea and Nausea And Vomiting    Prescriptions prior to admission   Medication  Sig  Dispense  Refill   .  ondansetron (ZOFRAN) 8 MG tablet  Take 1 tablet (8 mg total) by mouth every 8 (eight) hours as needed for nausea or vomiting.  30 tablet  0   .  oxyCODONE-acetaminophen (PERCOCET/ROXICET) 5-325 MG per tablet  Take 1 tablet by mouth every 4 (four) hours as needed.  15 tablet  0   .  Prenatal Vit-Fe Fumarate-FA (PRENATAL MULTIVITAMIN) TABS tablet  Take 1 tablet by mouth daily at 12 noon.  30 tablet  3   .  promethazine (PHENERGAN) 25 MG tablet  Take 1 tablet (25 mg total) by mouth every 6 (six) hours as needed for nausea or vomiting.  30 tablet  1   Review of Systems  Constitutional: Positive for fever, chills and malaise/fatigue.  Gastrointestinal: Positive for nausea, vomiting, abdominal pain and constipation. Negative for diarrhea.  Genitourinary: Positive for dysuria, urgency, frequency, hematuria and flank pain.  Neg - vaginal bleeding  + discharge  Physical Exam   Blood pressure 104/56, pulse 69, temperature 97.7 F (36.5 C), temperature source Oral, resp. rate 18, last menstrual period 09/24/2013, SpO2 99.00%.  Physical Exam  Constitutional: She is oriented to person, place, and time. She appears well-developed and well-nourished. No distress.  HENT:  Head: Normocephalic.  Cardiovascular: Normal rate.  Respiratory: Effort normal.  GI: Soft. She exhibits no distension and no mass. There is tenderness (moderate tenderness to palpation of the lower abdomen more  prominent in the LLQ). There is CVA tenderness (bilateral). There is no rebound and no guarding.  Neurological: She is alert and oriented to person, place, and time.  Skin: Skin is warm and dry. No erythema.  Psychiatric: She has a normal mood and affect.  Results for orders placed during the hospital encounter of 10/27/13 (from the past 24 hour(s))   URINALYSIS, ROUTINE W REFLEX MICROSCOPIC Status: Abnormal    Collection Time    10/26/13 8:50 PM   Result  Value  Ref Range    Color, Urine  AMBER (*)  YELLOW    APPearance  CLOUDY (*)  CLEAR    Specific Gravity, Urine  1.023  1.005 - 1.030    pH  6.0  5.0 - 8.0    Glucose, UA  NEGATIVE  NEGATIVE mg/dL    Hgb urine dipstick  NEGATIVE  NEGATIVE    Bilirubin Urine  SMALL (*)  NEGATIVE    Ketones, ur  NEGATIVE  NEGATIVE mg/dL    Protein, ur  NEGATIVE  NEGATIVE mg/dL    Urobilinogen, UA  1.0  0.0 - 1.0 mg/dL    Nitrite  NEGATIVE  NEGATIVE    Leukocytes, UA  LARGE (*)  NEGATIVE   URINE MICROSCOPIC-ADD ON Status: Abnormal    Collection Time    10/26/13 8:50 PM   Result  Value  Ref Range    Squamous Epithelial / LPF  MANY (*)  RARE    WBC, UA  TOO NUMEROUS TO COUNT  <3 WBC/hpf    Bacteria, UA  FEW (*)  RARE    Urine-Other  MUCOUS PRESENT    POC URINE PREG, ED Status: Abnormal    Collection Time    10/26/13 8:56 PM   Result  Value  Ref Range    Preg Test, Ur  POSITIVE (*)  NEGATIVE   CBC WITH DIFFERENTIAL Status: None    Collection Time    10/27/13 12:37 AM   Result  Value  Ref Range    WBC  6.2  4.0 - 10.5 K/uL    RBC  4.30  3.87 - 5.11 MIL/uL    Hemoglobin  14.2  12.0 - 15.0 g/dL    HCT  39.6  36.0 - 46.0 %  MCV  92.1  78.0 - 100.0 fL    MCH  33.0  26.0 - 34.0 pg    MCHC  35.9  30.0 - 36.0 g/dL    RDW  11.8  11.5 - 15.5 %    Platelets  182  150 - 400 K/uL    Neutrophils Relative %  53  43 - 77 %    Neutro Abs  3.3  1.7 - 7.7 K/uL    Lymphocytes Relative  40  12 - 46 %    Lymphs Abs  2.5  0.7 - 4.0 K/uL    Monocytes  Relative  5  3 - 12 %    Monocytes Absolute  0.3  0.1 - 1.0 K/uL    Eosinophils Relative  2  0 - 5 %    Eosinophils Absolute  0.2  0.0 - 0.7 K/uL    Basophils Relative  0  0 - 1 %    Basophils Absolute  0.0  0.0 - 0.1 K/uL   COMPREHENSIVE METABOLIC PANEL Status: Abnormal    Collection Time    10/27/13 12:37 AM   Result  Value  Ref Range    Sodium  135 (*)  137 - 147 mEq/L    Potassium  3.5 (*)  3.7 - 5.3 mEq/L    Chloride  101  96 - 112 mEq/L    CO2  19  19 - 32 mEq/L    Glucose, Bld  85  70 - 99 mg/dL    BUN  7  6 - 23 mg/dL    Creatinine, Ser  0.52  0.50 - 1.10 mg/dL    Calcium  9.4  8.4 - 10.5 mg/dL    Total Protein  6.4  6.0 - 8.3 g/dL    Albumin  4.1  3.5 - 5.2 g/dL    AST  12  0 - 37 U/L    ALT  8  0 - 35 U/L    Alkaline Phosphatase  40  39 - 117 U/L    Total Bilirubin  1.0  0.3 - 1.2 mg/dL    GFR calc non Af Amer  >90  >90 mL/min    GFR calc Af Amer  >90  >90 mL/min    Anion gap  15  5 - 15   ABO/RH Status: None    Collection Time    10/27/13 12:37 AM   Result  Value  Ref Range    ABO/RH(D)  O NEG     Antibody Screen  NEG    HCG, QUANTITATIVE, PREGNANCY Status: Abnormal    Collection Time    10/27/13 12:37 AM   Result  Value  Ref Range    hCG, Beta Chain, Quant, S  31748 (*)  <5 mIU/mL   WET PREP, GENITAL Status: Abnormal    Collection Time    10/27/13 2:54 AM   Result  Value  Ref Range    Yeast Wet Prep HPF POC  NONE SEEN  NONE SEEN    Trich, Wet Prep  NONE SEEN  NONE SEEN    Clue Cells Wet Prep HPF POC  FEW (*)  NONE SEEN    WBC, Wet Prep HPF POC  TOO NUMEROUS TO COUNT (*)  NONE SEEN   US Ob Comp Less 14 Wks  10/27/2013 CLINICAL DATA: Abdominal pain and spotting. Pregnancy. EXAM: OBSTETRIC <14 WK [place] AND TRANSVAGINAL OB US TECHNIQUE: Both transabdominal and transvaginal ultrasound examinations were performed for complete evaluation of  the gestation as well as the maternal uterus, adnexal regions, and pelvic cul-de-sac. Transvaginal technique was performed to  assess early pregnancy. COMPARISON: 10/03/2013 FINDINGS: Intrauterine gestational sac: Visualized/normal in shape. Yolk sac: No longer visible Embryo: Present Cardiac Activity: Present Heart Rate: 153 bpm CRL: 64.1 mm 12 w 5 d [place] EDC: 05/06/2014 Maternal uterus/adnexae: Previously noted subchronic hemorrhage is again visualized, now along the anterior wall of the lower uterine segment. The clot currently measures 7 mm in maximal diameter, previously 1.5 cm. The right ovary is not visualized. The left ovary is normal. No adnexal mass. IMPRESSION: 1. Single living intrauterine gestation. 2. Small subchronic hemorrhage which has decreased from July 2015, now 7 mm diameter. Electronically Signed By: Jorje Guild M.D. On: 10/27/2013 04:22    US Renal  10/27/2013 CLINICAL DATA: Urinary tract infection EXAM: RENAL/URINARY TRACT ULTRASOUND COMPLETE COMPARISON: 09/25/2013. FINDINGS: Right Kidney: Length: 11 cm. Caliectasis without overt hydronephrosis. No evidence of abscess or other mass. Left Kidney: Length: 11 cm. Echogenicity within normal limits. No mass or hydronephrosis visualized. Bladder: Debris dependently within the bladder. No wall thickening visible. IMPRESSION: 1. Debris in the urinary bladder, usually infectious. 2. Right caliectasis without overt hydronephrosis. Electronically Signed By: Jorje Guild M.D. On: 10/27/2013 04:25   MAU Course   Procedures  None  MDM  Discussed patient with Dr. Harolyn Rutherford. Admit to Women's Unit for IV antibiotics.   Assessment and Plan   A:  SIUP at [redacted]w[redacted]d Complicated cystitis in pregnancy, possible pyelonephritis  P:  Admit to Women's Unit for IV antibiotics  JFarris Has PA-C  10/27/2013, 7:22 AM   Attestation of Attending Supervision of Advanced Practitioner (PA/CNM/NP): Evaluation and management procedures were performed by the Advanced Practitioner under my supervision and collaboration.  I have reviewed the Advanced Practitioner's note and chart, and I  agree with the management and plan.  Patient seen and examined.  Reports not being able to tolerate antibiotics by mouth.  Has moderate suprapubic and flank tenderness; urine culture pending.  Will treat with Rocephin for now for complicated cystitis; also add Flagyl for BV treatment. Analgesics as needed.  Routine antenatal care.   UVerita Schneiders MD, FHemlockAttending OSundown [facility]

## 2013-10-27 NOTE — MAU Provider Note (Signed)
History     CSN: [id number]  Arrival date and time: 10/26/13 2023   First Provider Initiated Contact with Patient 10/27/13 [phone]      Chief Complaint  Patient presents with  . Urinary Tract Infection  . Back Pain   HPI [name] is a 23 y.o. G3P1011 at [redacted]w[redacted]d who presents to MAU today as a transfer by [facility] from [facility] for suspected Pyelonephritis and possible STD. The patient was admitted in July with similar symptoms and treated with IV antibiotics. She was discharged on Keflex. She states that symptoms resumed ~ 2-3 days ago. She has had bilateral flank pain and lower abdominal pain at midline. She also endorses dysuria and increased urinary frequency and urgency. She has had N/V throughout the pregnancy and states that N/V has been worse x 2 days. She takes Zofran at home with some relief. She states that she had a fever of 101 F at home 2 days ago. She has a history of interstitial cystitis and sees Dr. Betsy Coder in [place], although she has not been seen x 2-3 months. She is not currently on antibiotics and is not currently taking Phenergan previously prescribed for N/V. She receives her prenatal care with [facility].   OB History   Grav Para Term Preterm Abortions TAB SAB Ect Mult Living   3 1 1  0 1 0 1 0 0 1      Past Medical History  Diagnosis Date  . Interstitial cystitis   . Chronic pelvic pain in female   . Headache(784.0)   . Infection     UTI  . Depression     denies 07/26  . Pyelonephritis affecting pregnancy in first trimester July 2015    Past Surgical History  Procedure Laterality Date  . Hernia repair    . Vagina surgery      tumor removed, went to cancer center in [place]    Family History  Problem Relation Age of Onset  . Diabetes Mother   . Heart disease Mother   . Hypertension Father   . Diabetes Maternal Grandmother   . Hypertension Maternal Grandmother   . Stroke Maternal Grandmother   . Diabetes Maternal Grandfather   . Hypertension  Maternal Grandfather   . Diabetes Paternal Grandmother   . Diabetes Paternal Grandfather     History  Substance Use Topics  . Smoking status: Current Every Day Smoker -- 8 years    Types: Cigarettes  . Smokeless tobacco: Not on file     Comment: cutting back  . Alcohol Use: No    Allergies:  Allergies  Allergen Reactions  . Ciprofloxacin Diarrhea and Nausea And Vomiting    Prescriptions prior to admission  Medication Sig Dispense Refill  . ondansetron (ZOFRAN) 8 MG tablet Take 1 tablet (8 mg total) by mouth every 8 (eight) hours as needed for nausea or vomiting.  30 tablet  0  . oxyCODONE-acetaminophen (PERCOCET/ROXICET) 5-325 MG per tablet Take 1 tablet by mouth every 4 (four) hours as needed.  15 tablet  0  . Prenatal Vit-Fe Fumarate-FA (PRENATAL MULTIVITAMIN) TABS tablet Take 1 tablet by mouth daily at 12 noon.  30 tablet  3  . promethazine (PHENERGAN) 25 MG tablet Take 1 tablet (25 mg total) by mouth every 6 (six) hours as needed for nausea or vomiting.  30 tablet  1    Review of Systems  Constitutional: Positive for fever, chills and malaise/fatigue.  Gastrointestinal: Positive for nausea, vomiting, abdominal pain and constipation.  Negative for diarrhea.  Genitourinary: Positive for dysuria, urgency, frequency, hematuria and flank pain.       Neg - vaginal bleeding + discharge   Physical Exam   Blood pressure 104/56, pulse 69, temperature 97.7 F (36.5 C), temperature source Oral, resp. rate 18, last menstrual period 09/24/2013, SpO2 99.00%.  Physical Exam  Constitutional: She is oriented to person, place, and time. She appears well-developed and well-nourished. No distress.  HENT:  Head: Normocephalic.  Cardiovascular: Normal rate.   Respiratory: Effort normal.  GI: Soft. She exhibits no distension and no mass. There is tenderness (moderate tenderness to palpation of the lower abdomen more prominent in the LLQ). There is CVA tenderness (bilateral). There is no  rebound and no guarding.  Neurological: She is alert and oriented to person, place, and time.  Skin: Skin is warm and dry. No erythema.  Psychiatric: She has a normal mood and affect.    Results for orders placed during the hospital encounter of 10/27/13 (from the past 24 hour(s))  URINALYSIS, ROUTINE W REFLEX MICROSCOPIC     Status: Abnormal   Collection Time    10/26/13  8:50 PM      Result Value Ref Range   Color, Urine AMBER (*) YELLOW   APPearance CLOUDY (*) CLEAR   Specific Gravity, Urine 1.023  1.005 - 1.030   pH 6.0  5.0 - 8.0   Glucose, UA NEGATIVE  NEGATIVE mg/dL   Hgb urine dipstick NEGATIVE  NEGATIVE   Bilirubin Urine SMALL (*) NEGATIVE   Ketones, ur NEGATIVE  NEGATIVE mg/dL   Protein, ur NEGATIVE  NEGATIVE mg/dL   Urobilinogen, UA 1.0  0.0 - 1.0 mg/dL   Nitrite NEGATIVE  NEGATIVE   Leukocytes, UA LARGE (*) NEGATIVE  URINE MICROSCOPIC-ADD ON     Status: Abnormal   Collection Time    10/26/13  8:50 PM      Result Value Ref Range   Squamous Epithelial / LPF MANY (*) RARE   WBC, UA TOO NUMEROUS TO COUNT  <3 WBC/hpf   Bacteria, UA FEW (*) RARE   Urine-Other MUCOUS PRESENT    POC URINE PREG, ED     Status: Abnormal   Collection Time    10/26/13  8:56 PM      Result Value Ref Range   Preg Test, Ur POSITIVE (*) NEGATIVE  CBC WITH DIFFERENTIAL     Status: None   Collection Time    10/27/13 12:37 AM      Result Value Ref Range   WBC 6.2  4.0 - 10.5 K/uL   RBC 4.30  3.87 - 5.11 MIL/uL   Hemoglobin 14.2  12.0 - 15.0 g/dL   HCT 16.1  09.6 - 04.5 %   MCV 92.1  78.0 - 100.0 fL   MCH 33.0  26.0 - 34.0 pg   MCHC 35.9  30.0 - 36.0 g/dL   RDW 40.9  81.1 - 91.4 %   Platelets 182  150 - 400 K/uL   Neutrophils Relative % 53  43 - 77 %   Neutro Abs 3.3  1.7 - 7.7 K/uL   Lymphocytes Relative 40  12 - 46 %   Lymphs Abs 2.5  0.7 - 4.0 K/uL   Monocytes Relative 5  3 - 12 %   Monocytes Absolute 0.3  0.1 - 1.0 K/uL   Eosinophils Relative 2  0 - 5 %   Eosinophils Absolute 0.2   0.0 - 0.7 K/uL   Basophils Relative 0  0 - 1 %   Basophils Absolute 0.0  0.0 - 0.1 K/uL  COMPREHENSIVE METABOLIC PANEL     Status: Abnormal   Collection Time    10/27/13 12:37 AM      Result Value Ref Range   Sodium 135 (*) 137 - 147 mEq/L   Potassium 3.5 (*) 3.7 - 5.3 mEq/L   Chloride 101  96 - 112 mEq/L   CO2 19  19 - 32 mEq/L   Glucose, Bld 85  70 - 99 mg/dL   BUN 7  6 - 23 mg/dL   Creatinine, Ser 1.61  0.50 - 1.10 mg/dL   Calcium 9.4  8.4 - 09.6 mg/dL   Total Protein 6.4  6.0 - 8.3 g/dL   Albumin 4.1  3.5 - 5.2 g/dL   AST 12  0 - 37 U/L   ALT 8  0 - 35 U/L   Alkaline Phosphatase 40  39 - 117 U/L   Total Bilirubin 1.0  0.3 - 1.2 mg/dL   GFR calc non Af Amer >90  >90 mL/min   GFR calc Af Amer >90  >90 mL/min   Anion gap 15  5 - 15  ABO/RH     Status: None   Collection Time    10/27/13 12:37 AM      Result Value Ref Range   ABO/RH(D) O NEG     Antibody Screen NEG    HCG, QUANTITATIVE, PREGNANCY     Status: Abnormal   Collection Time    10/27/13 12:37 AM      Result Value Ref Range   hCG, Beta Chain, Quant, S [id number] (*) <5 mIU/mL  WET PREP, GENITAL     Status: Abnormal   Collection Time    10/27/13  2:54 AM      Result Value Ref Range   Yeast Wet Prep HPF POC NONE SEEN  NONE SEEN   Trich, Wet Prep NONE SEEN  NONE SEEN   Clue Cells Wet Prep HPF POC FEW (*) NONE SEEN   WBC, Wet Prep HPF POC TOO NUMEROUS TO COUNT (*) NONE SEEN   US Ob Comp Less 14 Wks  10/27/2013   CLINICAL DATA:  Abdominal pain and spotting.  Pregnancy.  EXAM: OBSTETRIC <14 WK [place] AND TRANSVAGINAL OB US  TECHNIQUE: Both transabdominal and transvaginal ultrasound examinations were performed for complete evaluation of the gestation as well as the maternal uterus, adnexal regions, and pelvic cul-de-sac. Transvaginal technique was performed to assess early pregnancy.  COMPARISON:  10/03/2013  FINDINGS: Intrauterine gestational sac: Visualized/normal in shape.  Yolk sac:  No longer visible  Embryo:  Present   Cardiac Activity: Present  Heart Rate:  153 bpm  CRL:   64.1  mm   12 w 5 d                  [place] EDC: 05/06/2014  Maternal uterus/adnexae: Previously noted subchronic hemorrhage is again visualized, now along the anterior wall of the lower uterine segment. The clot currently measures 7 mm in maximal diameter, previously 1.5 cm. The right ovary is not visualized. The left ovary is normal. No adnexal mass.  IMPRESSION: 1. Single living intrauterine gestation. 2. Small subchronic hemorrhage which has decreased from July 2015, now 7 mm diameter.   Electronically Signed   By: Tiburcio Pea M.D.   On: 10/27/2013 04:22   US Ob Transvaginal  10/27/2013   CLINICAL DATA:  Abdominal pain and spotting.  Pregnancy.  EXAM: OBSTETRIC <  14 WK US AND TRANSVAGINAL OB US  TECHNIQUE: Both transabdominal and transvaginal ultrasound examinations were performed for complete evaluation of the gestation as well as the maternal uterus, adnexal regions, and pelvic cul-de-sac. Transvaginal technique was performed to assess early pregnancy.  COMPARISON:  10/03/2013  FINDINGS: Intrauterine gestational sac: Visualized/normal in shape.  Yolk sac:  No longer visible  Embryo:  Present  Cardiac Activity: Present  Heart Rate:  153 bpm  CRL:   64.1  mm   12 w 5 d                  US EDC: 05/06/2014  Maternal uterus/adnexae: Previously noted subchronic hemorrhage is again visualized, now along the anterior wall of the lower uterine segment. The clot currently measures 7 mm in maximal diameter, previously 1.5 cm. The right ovary is not visualized. The left ovary is normal. No adnexal mass.  IMPRESSION: 1. Single living intrauterine gestation. 2. Small subchronic hemorrhage which has decreased from July 2015, now 7 mm diameter.   Electronically Signed   By: Tiburcio PeaJonathan  Watts M.D.   On: 10/27/2013 04:22   [place] Renal  10/27/2013   CLINICAL DATA:  Urinary tract infection  EXAM: RENAL/URINARY TRACT ULTRASOUND COMPLETE  COMPARISON:  09/25/2013.  FINDINGS:  Right Kidney:  Length: 11 cm. Caliectasis without overt hydronephrosis. No evidence of abscess or other mass.  Left Kidney:  Length: 11 cm. Echogenicity within normal limits. No mass or hydronephrosis visualized.  Bladder:  Debris dependently within the bladder.  No wall thickening visible.  IMPRESSION: 1. Debris in the urinary bladder, usually infectious. 2. Right caliectasis without overt hydronephrosis.   Electronically Signed   By: Tiburcio PeaJonathan  Watts M.D.   On: 10/27/2013 04:25    MAU Course  Procedures None  MDM Discussed patient with Dr. Macon LargeAnyanwu. Admit to Women's Unit for IV antibiotics.   Assessment and Plan  A: SIUP at 8541w2d Complicated cystitis in pregnancy, possible pyelonephritis  P: Admit to Women's Unit for IV antibiotics  Freddi StarrJulie N Ethier, PA-C  10/27/2013, 7:22 AM

## 2013-10-27 NOTE — ED Provider Notes (Signed)
CSN: [id number]     Arrival date & time 10/26/13  2023 History   First MD Initiated Contact with Patient 10/27/13 0011     Chief Complaint  Patient presents with  . Urinary Tract Infection  . Back Pain     (Consider location/radiation/quality/duration/timing/severity/associated sxs/prior Treatment) HPI Comments: Patient reports feeling bad and weak for the past one month with bodyaches, frequency, and dysuria. She is [redacted] weeks pregnant confirmed IUP. She endorses having low back pain and flank pain with lower abdominal pain worse over the past day but ongoing for the past month. Admitted to women's last month with pyelonephritis but had negative cultures. Completed a course of antibiotics. She denies fever. She endorses nausea and vomiting states she can't keep anything down at home. Denies any diarrhea. Denies any chest pain or shortness of breath. He endorses some spotting that happened 3 days ago which has since resolved. No vaginal discharge or leakage of fluid. She's been taking Tylenol without relief. Her doctor put her on Bactrim last week which was stopped after 2 days due to her pregnancy.  The history is provided by the patient.    Past Medical History  Diagnosis Date  . Interstitial cystitis   . Chronic pelvic pain in female   . Headache(784.0)   . Infection     UTI  . Depression     denies 07/26  . Pyelonephritis affecting pregnancy in first trimester July 2015   Past Surgical History  Procedure Laterality Date  . Hernia repair    . Vagina surgery      tumor removed, went to cancer center in [place]   Family History  Problem Relation Age of Onset  . Diabetes Mother   . Heart disease Mother   . Hypertension Father   . Diabetes Maternal Grandmother   . Hypertension Maternal Grandmother   . Stroke Maternal Grandmother   . Diabetes Maternal Grandfather   . Hypertension Maternal Grandfather   . Diabetes Paternal Grandmother   . Diabetes Paternal Grandfather    History   Substance Use Topics  . Smoking status: Current Every Day Smoker -- 8 years    Types: Cigarettes  . Smokeless tobacco: Not on file     Comment: cutting back  . Alcohol Use: No   OB History   Grav Para Term Preterm Abortions TAB SAB Ect Mult Living   3 1 1  0 1 0 1 0 0 1     Review of Systems  Constitutional: Positive for activity change, appetite change and fatigue. Negative for fever.  HENT: Negative for congestion and rhinorrhea.   Respiratory: Negative for cough, chest tightness and shortness of breath.   Gastrointestinal: Positive for nausea, vomiting and abdominal pain.  Genitourinary: Positive for dysuria, hematuria and vaginal bleeding.  Musculoskeletal: Positive for back pain.  Skin: Negative for rash.  Neurological: Positive for weakness and light-headedness.  A complete 10 system review of systems was obtained and all systems are negative except as noted in the HPI and PMH.      Allergies  Ciprofloxacin  Home Medications   Prior to Admission medications   Medication Sig Start Date End Date Taking? Authorizing Provider  ondansetron (ZOFRAN) 8 MG tablet Take 1 tablet (8 mg total) by mouth every 8 (eight) hours as needed for nausea or vomiting. 09/26/13  Yes Willodean Rosenthal, MD  oxyCODONE-acetaminophen (PERCOCET/ROXICET) 5-325 MG per tablet Take 1 tablet by mouth every 4 (four) hours as needed. 10/03/13  Yes Dorathy Kinsman, CNM  Prenatal Vit-Fe Fumarate-FA (PRENATAL MULTIVITAMIN) TABS tablet Take 1 tablet by mouth daily at 12 noon. 09/26/13  Yes Willodean Rosenthalarolyn Harraway-Smith, MD  promethazine (PHENERGAN) 25 MG tablet Take 1 tablet (25 mg total) by mouth every 6 (six) hours as needed for nausea or vomiting. 10/03/13  Yes Dorathy KinsmanVirginia Smith, CNM   BP 98/55  Pulse 64  Temp(Src) 98.4 F (36.9 C) (Oral)  Resp 18  Ht 5\' 5"  (1.651 m)  Wt 164 lb (74.39 kg)  BMI 27.29 kg/m2  SpO2 100%  LMP 09/24/2013 Physical Exam  Nursing note and vitals reviewed. Constitutional: She is  oriented to person, place, and time. She appears well-developed and well-nourished. No distress.  HENT:  Head: Normocephalic and atraumatic.  Mouth/Throat: Oropharynx is clear and moist. No oropharyngeal exudate.  Eyes: Conjunctivae and EOM are normal. Pupils are equal, round, and reactive to light.  Neck: Normal range of motion. Neck supple.  No meningismus.  Cardiovascular: Normal rate, regular rhythm, normal heart sounds and intact distal pulses.   No murmur heard. Pulmonary/Chest: Effort normal and breath sounds normal. No respiratory distress.  Abdominal: Soft. There is tenderness. There is no rebound and no guarding.  Suprapubic tenderness with guarding No pain at MCBurney's point.  Genitourinary: Vaginal discharge found.  Chaperone present. Normal external genitalia. Copious white discharge with CMT and diffuse adnexal tenderness.  Musculoskeletal: Normal range of motion. She exhibits tenderness. She exhibits no edema.  Bilateral CVAT  Neurological: She is alert and oriented to person, place, and time. No cranial nerve deficit. She exhibits normal muscle tone. Coordination normal.  No ataxia on finger to nose bilaterally. No pronator drift. 5/5 strength throughout. CN 2-12 intact. Negative Romberg. Equal grip strength. Sensation intact. Gait is normal.   Skin: Skin is warm.  Psychiatric: She has a normal mood and affect. Her behavior is normal.    ED Course  Procedures (including critical care time) Labs Review Labs Reviewed  WET PREP, GENITAL - Abnormal; Notable for the following:    Clue Cells Wet Prep HPF POC FEW (*)    WBC, Wet Prep HPF POC TOO NUMEROUS TO COUNT (*)    All other components within normal limits  URINALYSIS, ROUTINE W REFLEX MICROSCOPIC - Abnormal; Notable for the following:    Color, Urine AMBER (*)    APPearance CLOUDY (*)    Bilirubin Urine SMALL (*)    Leukocytes, UA LARGE (*)    All other components within normal limits  URINE MICROSCOPIC-ADD ON -  Abnormal; Notable for the following:    Squamous Epithelial / LPF MANY (*)    Bacteria, UA FEW (*)    All other components within normal limits  COMPREHENSIVE METABOLIC PANEL - Abnormal; Notable for the following:    Sodium 135 (*)    Potassium 3.5 (*)    All other components within normal limits  HCG, QUANTITATIVE, PREGNANCY - Abnormal; Notable for the following:    hCG, Beta Chain, Quant, S [id number] (*)    All other components within normal limits  POC URINE PREG, ED - Abnormal; Notable for the following:    Preg Test, Ur POSITIVE (*)    All other components within normal limits  GC/CHLAMYDIA PROBE AMP  URINE CULTURE  CBC WITH DIFFERENTIAL  ABO/RH    Imaging Review [place] Ob Comp Less 14 Wks  10/27/2013   CLINICAL DATA:  Abdominal pain and spotting.  Pregnancy.  EXAM: OBSTETRIC <14 WK US AND TRANSVAGINAL OB US  TECHNIQUE: Both transabdominal and transvaginal ultrasound examinations  were performed for complete evaluation of the gestation as well as the maternal uterus, adnexal regions, and pelvic cul-de-sac. Transvaginal technique was performed to assess early pregnancy.  COMPARISON:  10/03/2013  FINDINGS: Intrauterine gestational sac: Visualized/normal in shape.  Yolk sac:  No longer visible  Embryo:  Present  Cardiac Activity: Present  Heart Rate:  153 bpm  CRL:   64.1  mm   12 w 5 d                  [place] EDC: 05/06/2014  Maternal uterus/adnexae: Previously noted subchronic hemorrhage is again visualized, now along the anterior wall of the lower uterine segment. The clot currently measures 7 mm in maximal diameter, previously 1.5 cm. The right ovary is not visualized. The left ovary is normal. No adnexal mass.  IMPRESSION: 1. Single living intrauterine gestation. 2. Small subchronic hemorrhage which has decreased from July 2015, now 7 mm diameter.   Electronically Signed   By: Tiburcio Pea M.D.   On: 10/27/2013 04:22   US Ob Transvaginal  10/27/2013   CLINICAL DATA:  Abdominal pain and  spotting.  Pregnancy.  EXAM: OBSTETRIC <14 WK [place] AND TRANSVAGINAL OB US  TECHNIQUE: Both transabdominal and transvaginal ultrasound examinations were performed for complete evaluation of the gestation as well as the maternal uterus, adnexal regions, and pelvic cul-de-sac. Transvaginal technique was performed to assess early pregnancy.  COMPARISON:  10/03/2013  FINDINGS: Intrauterine gestational sac: Visualized/normal in shape.  Yolk sac:  No longer visible  Embryo:  Present  Cardiac Activity: Present  Heart Rate:  153 bpm  CRL:   64.1  mm   12 w 5 d                  [place] EDC: 05/06/2014  Maternal uterus/adnexae: Previously noted subchronic hemorrhage is again visualized, now along the anterior wall of the lower uterine segment. The clot currently measures 7 mm in maximal diameter, previously 1.5 cm. The right ovary is not visualized. The left ovary is normal. No adnexal mass.  IMPRESSION: 1. Single living intrauterine gestation. 2. Small subchronic hemorrhage which has decreased from July 2015, now 7 mm diameter.   Electronically Signed   By: Tiburcio Pea M.D.   On: 10/27/2013 04:22   US Renal  10/27/2013   CLINICAL DATA:  Urinary tract infection  EXAM: RENAL/URINARY TRACT ULTRASOUND COMPLETE  COMPARISON:  09/25/2013.  FINDINGS: Right Kidney:  Length: 11 cm. Caliectasis without overt hydronephrosis. No evidence of abscess or other mass.  Left Kidney:  Length: 11 cm. Echogenicity within normal limits. No mass or hydronephrosis visualized.  Bladder:  Debris dependently within the bladder.  No wall thickening visible.  IMPRESSION: 1. Debris in the urinary bladder, usually infectious. 2. Right caliectasis without overt hydronephrosis.   Electronically Signed   By: Tiburcio Pea M.D.   On: 10/27/2013 04:25     EKG Interpretation None      MDM   Final diagnoses:  PID (acute pelvic inflammatory disease)  Pyelonephritis  Pregnancy   Nausea, vomiting, urinary symptoms with pregnancy. Superpubic  tenderness with flank tenderness.  UA remarkable for infection. We'll sent culture and start Rocephin. Rh factor negative.  Pelvic exam concerning for PID. Patient will be treated with Rocephin and azithromycin.  No stones on renal US.  IUP viable on [place] with decreasing subchorionic hemorrhage.  D/w Dr. Macon Large who accepts patient in transfer to [facility] hospital for IV antibiotics and hydration.  She does not recommend Rhogam  despite patient's complaints of spotting 2 days ago.  BP 98/55  Pulse 64  Temp(Src) 98.4 F (36.9 C) (Oral)  Resp 18  Ht 5\' 5"  (1.651 m)  Wt 164 lb (74.39 kg)  BMI 27.29 kg/m2  SpO2 100%  LMP 09/24/2013   Glynn Octave, MD 10/27/13 [phone]

## 2013-10-27 NOTE — MAU Note (Signed)
[facility] and back pain.

## 2013-10-27 NOTE — Progress Notes (Signed)
Patient not voiding in specimen hat in order to measure urine.  Instructed patient several times that we need to measure urine.

## 2013-10-27 NOTE — ED Notes (Signed)
Dr. Rancour at bedside. 

## 2013-10-28 DIAGNOSIS — N730 Acute parametritis and pelvic cellulitis: Secondary | ICD-10-CM

## 2013-10-28 LAB — GC/CHLAMYDIA PROBE AMP
CT Probe RNA: NEGATIVE
GC Probe RNA: NEGATIVE

## 2013-10-28 LAB — URINE CULTURE: Colony Count: 7000

## 2013-10-28 MED ORDER — CEPHALEXIN 500 MG PO CAPS: 500.0000 mg | ORAL_CAPSULE | Freq: Four times a day (QID) | ORAL | Status: DC

## 2013-10-28 MED ORDER — ONDANSETRON HCL 8 MG PO TABS: 8.0000 mg | ORAL_TABLET | Freq: Three times a day (TID) | ORAL | Status: DC | PRN

## 2013-10-28 NOTE — Progress Notes (Signed)
Late entry for 1200: Pt verbalizes understanding of d/c instructions, medications, follow up appts and when to seek medical attention. IV was removed without complications. Pt reminded of belongings policy and encouraged to check room thoroughly for belongings before leaving. No questions at this time. Pt walked to main entrance with staff member. Pts family member will be driving her home. Sheryn Bison

## 2013-10-28 NOTE — Plan of Care (Signed)
Problem: Phase III Progression Outcomes Goal: IV Medications to PO Outcome: Progressing Pt receiving final dose of IV antibiotics prior to d/c.

## 2013-10-28 NOTE — Plan of Care (Signed)
Problem: Discharge Progression Outcomes Goal: Pain controlled with appropriate interventions Outcome: Completed/Met Date Met:  10/28/13 Chronic pain d/t interstitial cystitis.      

## 2013-10-28 NOTE — Discharge Summary (Signed)
Physician Discharge Summary  Patient ID: Victoria Alvarez MRN: [id number] DOB/AGE: May 03, 1990 23 y.o.  Admit date: 10/27/2013 Discharge date: 10/28/2013   Discharge Diagnoses:  Principal Problem:   Pyelonephritis affecting pregnancy in first trimester, antepartum Active Problems:   Pyelonephritis   Consults: None  Significant Diagnostic Studies: labs: wet prep shows TNTC WBC's, U/A shows Large leuks, GC/CHlam neg and radiology: Ultrasound: normal renal u/s, normal ob US except for resolving [facility]  US Ob Transvaginal  10/03/2013   ADDENDUM REPORT: 10/03/2013 12:15  ADDENDUM: Maternal ovaries appear within normal limits bilaterally.   Electronically Signed   By: Lowella Grip M.D.   On: 10/03/2013 12:15   10/03/2013   CLINICAL DATA:  Pelvic  EXAM: TRANSVAGINAL OB ULTRASOUND  TECHNIQUE: Transvaginal ultrasound was performed for complete evaluation of the gestation as well as the maternal uterus, adnexal regions, and pelvic cul-de-sac.  COMPARISON:  None.  FINDINGS: Intrauterine gestational sac: Visualized/normal in shape.  Yolk sac:  Visualized  Embryo:  Visualized  Cardiac Activity: Visualized  Heart Rate: 175 bpm  CRL:   23  mm   9 w 0 d                  [place] EDC: May 08, 2013  Maternal uterus/adnexae: A thin membrane surrounds the fetus. There is a lower uterine segment subchorionic hemorrhage measuring 1.6 x 0.9 x 2.1 cm. Cervical os is closed. Maternal adnexal structures appear normal bilaterally. No appreciable free pelvic fluid.  IMPRESSION: Single live intrauterine gestation with estimated gestational age of [redacted] weeks. Small lower uterine segment subchorionic hemorrhage. Study otherwise unremarkable.  Electronically Signed: By: Lowella Grip M.D. On: 10/03/2013 11:50   US Renal  10/27/2013   CLINICAL DATA:  Urinary tract infection  EXAM: RENAL/URINARY TRACT ULTRASOUND COMPLETE  COMPARISON:  09/25/2013.  FINDINGS: Right Kidney:  Length: 11 cm. Caliectasis without overt  hydronephrosis. No evidence of abscess or other mass.  Left Kidney:  Length: 11 cm. Echogenicity within normal limits. No mass or hydronephrosis visualized.  Bladder:  Debris dependently within the bladder.  No wall thickening visible.  IMPRESSION: 1. Debris in the urinary bladder, usually infectious. 2. Right caliectasis without overt hydronephrosis.   Electronically Signed   By: Jorje Guild M.D.   On: 10/27/2013 04:25     Hospital Course: Admitted x 24 hours and given IV Antibiotics.  Emesis resolved. Felt better and ready for discharge. She had no elevation of WBC and no fever during admission.  Previous admission similar about 1 month ago. Negative urine cultures at that time.  Will send home with antibiotics although urine culture is still pending.  Disposition: 01-Home or Self Care  Discharged Condition: good     Medication List         cephALEXin 500 MG capsule  Commonly known as:  KEFLEX  Take 1 capsule (500 mg total) by mouth 4 (four) times daily.     ondansetron 8 MG tablet  Commonly known as:  ZOFRAN  Take 1 tablet (8 mg total) by mouth every 8 (eight) hours as needed for nausea or vomiting.     oxyCODONE-acetaminophen 5-325 MG per tablet  Commonly known as:  PERCOCET/ROXICET  Take 1 tablet by mouth every 4 (four) hours as needed.     prenatal multivitamin Tabs tablet  Take 1 tablet by mouth daily at 12 noon.     promethazine 25 MG tablet  Commonly known as:  PHENERGAN  Take 1 tablet (25 mg total) by mouth every  6 (six) hours as needed for nausea or vomiting.           Follow-up Information   Schedule an appointment as soon as possible for a visit to follow up. (New OB visit with the provider of your choice)       Signed: Travares Nelles S 10/28/2013, 9:20 AM

## 2013-10-28 NOTE — Discharge Instructions (Signed)
List of OB providers in [place] and [facility] [address] [facility] [place] [address] [phone]  BRYSON,JONATHAN,S [facility] [place] [phone] ([place] III,MERRITT,H [address] [place] [address] ([place], [place] [address] [phone]  [facility] [address] [address] [phone]  [facility] [address] [facility] [place] [address] [phone]  [facility] [address] [place] [address] [address] [phone]  CARROLL,TIFFENY,S [address] [place] [address] [phone]  [facility] [address] [place] [address] [phone]  ELLIS,WILLIAM,D [address] [facility] [place] [address] [phone]  [place] [address] [phone]  [facility] [place] [address] [phone]  PALMER,CHARLES,B [place] [address] [phone]  [place] [address] [phone]  [facility] [place] [address] [phone]     List of providers in [facility]  Provider name Specialty Phone  Marga Hoots., MD  OB/GYN  [phone]  Brent General, DO  OB/GYN  [phone]  Kristie Cowman., MD  OB/GYN  [phone]  Damaris Schooner, DO  OB/GYN  [phone]  Carlena Bjornstad ., MD  OB/GYN  [phone]     List of providers in [place] [facility]  [phone]     Phone: [place]                      [facility]  @[place]     Phone: [phone]  Phone: [place]     Phone: [phone]  Phone: [facility] [place]                hone: [phone]  Phone: [phone]         [facility] [address] Racer      Phone: [phone]  Phone: [phone]         [place] [facility]                Phone: [phone]  [place])          Phone: [phone] [facility]   Phone: [phone]   Back Pain in Pregnancy Back pain during pregnancy is common. It happens in about half of all pregnancies. It is important for you and your baby that you remain active during your pregnancy.If you feel that back pain is not allowing you to remain active or sleep well, it is time to see your caregiver. Back pain may be caused by several factors related to changes during your pregnancy.Fortunately, unless you had trouble with your back before your pregnancy, the pain is likely to get better after you deliver. Low back pain usually occurs between the fifth and seventh months of  pregnancy. It can, however, happen in the first couple months. Factors that increase the risk of back problems include:   Previous back problems.  Injury to your back.  Having twins or multiple births.  A chronic cough.  Stress.  Job-related repetitive motions.  Muscle or spinal disease in the back.  Family history of back problems, ruptured (herniated) discs, or osteoporosis.  Depression, anxiety, and panic attacks. CAUSES   When you are pregnant, your body produces a hormone called relaxin. This hormonemakes the ligaments connecting the low back and pubic bones more flexible. This  flexibility allows the baby to be delivered more easily. When your ligaments are loose, your muscles need to work harder to support your back. Soreness in your back can come from tired muscles. Soreness can also come from back tissues that are irritated since they are receiving less support.  As the baby grows, it puts pressure on the nerves and blood vessels in your pelvis. This can cause back pain.  As the baby grows and gets heavier during pregnancy, the uterus pushes the stomach muscles forward and changes your center of gravity. This makes your back muscles work harder to maintain good posture. SYMPTOMS  Lumbar pain during pregnancy Lumbar pain during pregnancy usually occurs at or above the waist in the center of the back. There may be pain and numbness that radiates into your leg or foot. This is similar to low back pain experienced by non-pregnant women. It usually increases with sitting for long periods of time, standing, or repetitive lifting. Tenderness may also be present in the muscles along your upper back. Posterior pelvic pain during pregnancy Pain in the back of the pelvis is more common than lumbar pain in pregnancy. It is a deep pain felt in your side at the waistline, or across the tailbone (sacrum), or in both places. You may have pain on one or both sides. This pain can also go into the buttocks and backs of the upper thighs. Pubic and groin pain may also be present. The pain does not quickly resolve with rest, and morning stiffness may also be present. Pelvic pain during pregnancy can be brought on by most activities. A high level of fitness before and during pregnancy may or may not prevent this problem. Labor pain is usually 1 to 2 minutes apart, lasts for about 1 minute, and involves a bearing down feeling or pressure in your pelvis. However, if you are at term with the pregnancy, constant low back pain can be the beginning of early labor, and you should be aware of  this. DIAGNOSIS  X-rays of the back should not be done during the first 12 to 14 weeks of the pregnancy and only when absolutely necessary during the rest of the pregnancy. MRIs do not give off radiation and are safe during pregnancy. MRIs also should only be done when absolutely necessary. HOME CARE INSTRUCTIONS  Exercise as directed by your caregiver. Exercise is the most effective way to prevent or manage back pain. If you have a back problem, it is especially important to avoid sports that require sudden body movements. Swimming and walking are great activities.  Do not stand in one place for long periods of time.  Do not wear high heels.  Sit in chairs with good posture. Use a pillow on your lower back if necessary. Make sure your head rests over your shoulders and is not hanging forward.  Try sleeping on your side, preferably the left side, with a  pillow or two between your legs. If you are sore after a night's rest, your bedmay betoo soft.Try placing a board between your mattress and box spring.  Listen to your body when lifting.If you are experiencing pain, ask for help or try bending yourknees more so you can use your leg muscles rather than your back muscles. Squat down when picking up something from the floor. Do not bend over.  Eat a healthy diet. Try to gain weight within your caregiver's recommendations.  Use heat or cold packs 3 to 4 times a day for 15 minutes to help with the pain.  Only take over-the-counter or prescription medicines for pain, discomfort, or fever as directed by your caregiver. Sudden (acute) back pain  Use bed rest for only the most extreme, acute episodes of back pain. Prolonged bed rest over 48 hours will aggravate your condition.  Ice is very effective for acute conditions.  Put ice in a plastic bag.  Place a towel between your skin and the bag.  Leave the ice on for 10 to 20 minutes every 2 hours, or as needed.  Using heat packs for 30  minutes prior to activities is also helpful. Continued back pain See your caregiver if you have continued problems. Your caregiver can help or refer you for appropriate physical therapy. With conditioning, most back problems can be avoided. Sometimes, a more serious issue may be the cause of back pain. You should be seen right away if new problems seem to be developing. Your caregiver may recommend:  A maternity girdle.  An elastic sling.  A back brace.  A massage therapist or acupuncture. SEEK MEDICAL CARE IF:   You are not able to do most of your daily activities, even when taking the pain medicine you were given.  You need a referral to a physical therapist or chiropractor.  You want to try acupuncture. SEEK IMMEDIATE MEDICAL CARE IF:  You develop numbness, tingling, weakness, or problems with the use of your arms or legs.  You develop severe back pain that is no longer relieved with medicines.  You have a sudden change in bowel or bladder control.  You have increasing pain in other areas of the body.  You develop shortness of breath, dizziness, or fainting.  You develop nausea, vomiting, or sweating.  You have back pain which is similar to labor pains.  You have back pain along with your water breaking or vaginal bleeding.  You have back pain or numbness that travels down your leg.  Your back pain developed after you fell.  You develop pain on one side of your back. You may have a kidney stone.  You see blood in your urine. You may have a bladder infection or kidney stone.  You have back pain with blisters. You may have shingles. Back pain is fairly common during pregnancy but should not be accepted as just part of the process. Back pain should always be treated as soon as possible. This will make your pregnancy as pleasant as possible. Document Released: 06/05/2005 Document Revised: 05/20/2011 Document Reviewed: 07/17/2010 [facility] Patient Information 2015  [place], [place]. This information is not intended to replace advice given to you by your health care provider. Make sure you discuss any questions you have with your health care provider.

## 2013-12-23 ENCOUNTER — Emergency Department (HOSPITAL_COMMUNITY)
Admission: EM | Admit: 2013-12-23 | Discharge: 2013-12-23 | Disposition: A | Discharge status: Home / Self Care | Payer: Medicaid Other | Attending: Emergency Medicine | Admitting: Emergency Medicine

## 2013-12-23 ENCOUNTER — Encounter (HOSPITAL_COMMUNITY): Payer: Self-pay | Admitting: Emergency Medicine

## 2013-12-23 DIAGNOSIS — R109 Unspecified abdominal pain: Secondary | ICD-10-CM | POA: Insufficient documentation

## 2013-12-23 DIAGNOSIS — Z8659 Personal history of other mental and behavioral disorders: Secondary | ICD-10-CM | POA: Diagnosis not present

## 2013-12-23 DIAGNOSIS — O99332 Smoking (tobacco) complicating pregnancy, second trimester: Secondary | ICD-10-CM | POA: Diagnosis not present

## 2013-12-23 DIAGNOSIS — Z87440 Personal history of urinary (tract) infections: Secondary | ICD-10-CM | POA: Insufficient documentation

## 2013-12-23 DIAGNOSIS — Z9889 Other specified postprocedural states: Secondary | ICD-10-CM | POA: Insufficient documentation

## 2013-12-23 DIAGNOSIS — R1030 Lower abdominal pain, unspecified: Secondary | ICD-10-CM

## 2013-12-23 DIAGNOSIS — Z791 Long term (current) use of non-steroidal anti-inflammatories (NSAID): Secondary | ICD-10-CM | POA: Diagnosis not present

## 2013-12-23 DIAGNOSIS — Z79899 Other long term (current) drug therapy: Secondary | ICD-10-CM | POA: Insufficient documentation

## 2013-12-23 DIAGNOSIS — G8929 Other chronic pain: Secondary | ICD-10-CM | POA: Insufficient documentation

## 2013-12-23 DIAGNOSIS — Z3A20 20 weeks gestation of pregnancy: Secondary | ICD-10-CM | POA: Diagnosis not present

## 2013-12-23 DIAGNOSIS — R110 Nausea: Secondary | ICD-10-CM | POA: Insufficient documentation

## 2013-12-23 DIAGNOSIS — M549 Dorsalgia, unspecified: Secondary | ICD-10-CM | POA: Diagnosis not present

## 2013-12-23 DIAGNOSIS — F17210 Nicotine dependence, cigarettes, uncomplicated: Secondary | ICD-10-CM | POA: Diagnosis not present

## 2013-12-23 DIAGNOSIS — O9989 Other specified diseases and conditions complicating pregnancy, childbirth and the puerperium: Secondary | ICD-10-CM | POA: Insufficient documentation

## 2013-12-23 LAB — URINALYSIS, ROUTINE W REFLEX MICROSCOPIC
Glucose, UA: NEGATIVE mg/dL
Hgb urine dipstick: NEGATIVE
Ketones, ur: 15 mg/dL — AB
Nitrite: NEGATIVE
Protein, ur: NEGATIVE mg/dL
Specific Gravity, Urine: 1.024 (ref 1.005–1.030)
Urobilinogen, UA: 1 mg/dL (ref 0.0–1.0)
pH: 6.5 (ref 5.0–8.0)

## 2013-12-23 LAB — URINE MICROSCOPIC-ADD ON

## 2013-12-23 LAB — CBC
HCT: 37.7 % (ref 36.0–46.0)
Hemoglobin: 13.5 g/dL (ref 12.0–15.0)
MCH: 33.3 pg (ref 26.0–34.0)
MCHC: 35.8 g/dL (ref 30.0–36.0)
MCV: 92.9 fL (ref 78.0–100.0)
Platelets: 199 10*3/uL (ref 150–400)
RBC: 4.06 MIL/uL (ref 3.87–5.11)
RDW: 12.3 % (ref 11.5–15.5)
WBC: 7.1 10*3/uL (ref 4.0–10.5)

## 2013-12-23 LAB — COMPREHENSIVE METABOLIC PANEL
ALT: 6 U/L (ref 0–35)
AST: 11 U/L (ref 0–37)
Albumin: 3.1 g/dL — ABNORMAL LOW (ref 3.5–5.2)
Alkaline Phosphatase: 38 U/L — ABNORMAL LOW (ref 39–117)
Anion gap: 15 (ref 5–15)
BUN: 6 mg/dL (ref 6–23)
CO2: 18 mEq/L — ABNORMAL LOW (ref 19–32)
Calcium: 9.1 mg/dL (ref 8.4–10.5)
Chloride: 104 mEq/L (ref 96–112)
Creatinine, Ser: 0.51 mg/dL (ref 0.50–1.10)
GFR calc Af Amer: >90 mL/min (ref >90)
GFR calc non Af Amer: >90 mL/min (ref >90)
Glucose, Bld: 105 mg/dL — ABNORMAL HIGH (ref 70–99)
Potassium: 3.6 mEq/L — ABNORMAL LOW (ref 3.7–5.3)
Sodium: 137 mEq/L (ref 137–147)
Total Bilirubin: 0.8 mg/dL (ref 0.3–1.2)
Total Protein: 6.3 g/dL (ref 6.0–8.3)

## 2013-12-23 MED ORDER — SODIUM CHLORIDE 0.9 % IV BOLUS (SEPSIS)
1000.0000 mL | Freq: Once | INTRAVENOUS | Status: AC
Start: 2013-12-23 — End: 2013-12-23
  Administered 2013-12-23: 1000 mL via INTRAVENOUS

## 2013-12-23 MED ORDER — HYDROCODONE-ACETAMINOPHEN 5-325 MG PO TABS: 1.0000 | ORAL_TABLET | Freq: Four times a day (QID) | ORAL | Status: DC | PRN

## 2013-12-23 MED ORDER — HYDROMORPHONE HCL 1 MG/ML IJ SOLN
0.5000 mg | Freq: Once | INTRAMUSCULAR | Status: AC
  Administered 2013-12-23: 0.5 mg via INTRAVENOUS
  Filled 2013-12-23: qty 1

## 2013-12-23 MED ORDER — SODIUM CHLORIDE 0.9 % IV BOLUS (SEPSIS)
500.0000 mL | Freq: Once | INTRAVENOUS | Status: AC
  Administered 2013-12-23: 500 mL via INTRAVENOUS

## 2013-12-23 NOTE — ED Provider Notes (Addendum)
CSN: [id number]     Arrival date & time 12/23/13  1432 History   First MD Initiated Contact with Patient 12/23/13 1501     Chief Complaint  Patient presents with  . Abdominal Pain    patient is [redacted] wks pregnant  . Back Pain  . urinary symptoms      (Consider location/radiation/quality/duration/timing/severity/associated sxs/prior Treatment) Patient is a 23 y.o. female presenting with abdominal pain and back pain. The history is provided by the patient.  Abdominal Pain Associated symptoms: dysuria   Associated symptoms: no chest pain, no chills, no cough, no fever, no shortness of breath, no sore throat, no vaginal bleeding and no vaginal discharge   Back Pain Associated symptoms: abdominal pain and dysuria   Associated symptoms: no chest pain, no fever and no headaches   pt g3p1 (1 prior miscarriage, 1 child/complicated by pre-eclampsia), hx uti/pyelo, c/o suprapubic dull pain and burning sensation for the past 1-2 days. Pain moderate, persistent, without specific exacerbating or alleviating factors. Nausea. Had episode of emesis, not bloody or bilious. No fever or chills. Is eating and drinking. Having normal bms. No back or flank pain. No vaginal discharge or bleeding. +pnc. Hx interstitial cystitis.        Past Medical History  Diagnosis Date  . Interstitial cystitis   . Chronic pelvic pain in female   . Headache(784.0)   . Infection     UTI  . Depression     denies 07/26  . Pyelonephritis affecting pregnancy in first trimester July 2015   Past Surgical History  Procedure Laterality Date  . Hernia repair    . Vagina surgery      tumor removed, went to cancer center in [place]   Family History  Problem Relation Age of Onset  . Diabetes Mother   . Heart disease Mother   . Hypertension Father   . Diabetes Maternal Grandmother   . Hypertension Maternal Grandmother   . Stroke Maternal Grandmother   . Diabetes Maternal Grandfather   . Hypertension Maternal Grandfather    . Diabetes Paternal Grandmother   . Diabetes Paternal Grandfather    History  Substance Use Topics  . Smoking status: Current Every Day Smoker -- 8 years    Types: Cigarettes  . Smokeless tobacco: Not on file     Comment: cutting back  . Alcohol Use: No   OB History   Grav Para Term Preterm Abortions TAB SAB Ect Mult Living   3 1 1  0 1 0 1 0 0 1     Review of Systems  Constitutional: Negative for fever and chills.  HENT: Negative for sore throat.   Eyes: Negative for redness.  Respiratory: Negative for cough and shortness of breath.   Cardiovascular: Negative for chest pain.  Gastrointestinal: Positive for abdominal pain.  Genitourinary: Positive for dysuria. Negative for flank pain, vaginal bleeding and vaginal discharge.  Musculoskeletal: Positive for back pain. Negative for neck pain.  Skin: Negative for rash.  Neurological: Negative for headaches.  Hematological: Does not bruise/bleed easily.  Psychiatric/Behavioral: Negative for confusion.      Allergies  Ciprofloxacin  Home Medications   Prior to Admission medications   Medication Sig Start Date End Date Taking? Authorizing Provider  cephALEXin (KEFLEX) 500 MG capsule Take 1 capsule (500 mg total) by mouth 4 (four) times daily. 10/28/13   Donnamae Jude, MD  ondansetron (ZOFRAN) 8 MG tablet Take 1 tablet (8 mg total) by mouth every 8 (eight) hours as needed  for nausea or vomiting. 10/28/13   Donnamae Jude, MD  oxyCODONE-acetaminophen (PERCOCET/ROXICET) 5-325 MG per tablet Take 1 tablet by mouth every 4 (four) hours as needed. 10/03/13   Manya Silvas, CNM  Prenatal Vit-Fe Fumarate-FA (PRENATAL MULTIVITAMIN) TABS tablet Take 1 tablet by mouth daily at 12 noon. 09/26/13   Lavonia Drafts, MD  promethazine (PHENERGAN) 25 MG tablet Take 1 tablet (25 mg total) by mouth every 6 (six) hours as needed for nausea or vomiting. 10/03/13   Manya Silvas, CNM   BP 113/67  Pulse 84  Temp(Src) 98.8 F (37.1 C) (Oral)   Resp 12  Ht 5' 2"  (1.575 m)  Wt 170 lb (77.111 kg)  BMI 31.09 kg/m2  SpO2 96%  LMP 09/24/2013 Physical Exam  Nursing note and vitals reviewed. Constitutional: She appears well-developed and well-nourished. No distress.  HENT:  Mouth/Throat: Oropharynx is clear and moist.  Eyes: Conjunctivae are normal. No scleral icterus.  Neck: Neck supple. No tracheal deviation present.  Cardiovascular: Normal rate, regular rhythm, normal heart sounds and intact distal pulses.   Pulmonary/Chest: Effort normal and breath sounds normal. No respiratory distress.  Abdominal: Soft. Normal appearance and bowel sounds are normal. She exhibits no distension and no mass. There is no tenderness. There is no rebound and no guarding.  Genitourinary:  No cva tenderness  Musculoskeletal: She exhibits no edema.  Neurological: She is alert.  Skin: Skin is warm and dry. No rash noted. She is not diaphoretic.  Psychiatric: She has a normal mood and affect.    ED Course  Procedures (including critical care time) Labs Review  Results for orders placed during the hospital encounter of 12/23/13  CBC      Result Value Ref Range   WBC 7.1  4.0 - 10.5 K/uL   RBC 4.06  3.87 - 5.11 MIL/uL   Hemoglobin 13.5  12.0 - 15.0 g/dL   HCT 37.7  36.0 - 46.0 %   MCV 92.9  78.0 - 100.0 fL   MCH 33.3  26.0 - 34.0 pg   MCHC 35.8  30.0 - 36.0 g/dL   RDW 12.3  11.5 - 15.5 %   Platelets 199  150 - 400 K/uL  COMPREHENSIVE METABOLIC PANEL      Result Value Ref Range   Sodium 137  137 - 147 mEq/L   Potassium 3.6 (*) 3.7 - 5.3 mEq/L   Chloride 104  96 - 112 mEq/L   CO2 18 (*) 19 - 32 mEq/L   Glucose, Bld 105 (*) 70 - 99 mg/dL   BUN 6  6 - 23 mg/dL   Creatinine, Ser 0.51  0.50 - 1.10 mg/dL   Calcium 9.1  8.4 - 10.5 mg/dL   Total Protein 6.3  6.0 - 8.3 g/dL   Albumin 3.1 (*) 3.5 - 5.2 g/dL   AST 11  0 - 37 U/L   ALT 6  0 - 35 U/L   Alkaline Phosphatase 38 (*) 39 - 117 U/L   Total Bilirubin 0.8  0.3 - 1.2 mg/dL   GFR calc non  Af Amer >90  >90 mL/min   GFR calc Af Amer >90  >90 mL/min   Anion gap 15  5 - 15  URINALYSIS, ROUTINE W REFLEX MICROSCOPIC      Result Value Ref Range   Color, Urine AMBER (*) YELLOW   APPearance CLOUDY (*) CLEAR   Specific Gravity, Urine 1.024  1.005 - 1.030   pH 6.5  5.0 - 8.0  Glucose, UA NEGATIVE  NEGATIVE mg/dL   Hgb urine dipstick NEGATIVE  NEGATIVE   Bilirubin Urine SMALL (*) NEGATIVE   Ketones, ur 15 (*) NEGATIVE mg/dL   Protein, ur NEGATIVE  NEGATIVE mg/dL   Urobilinogen, UA 1.0  0.0 - 1.0 mg/dL   Nitrite NEGATIVE  NEGATIVE   Leukocytes, UA SMALL (*) NEGATIVE  URINE MICROSCOPIC-ADD ON      Result Value Ref Range   Squamous Epithelial / LPF FEW (*) RARE   WBC, UA 3-6  <3 WBC/hpf   Bacteria, UA FEW (*) RARE     MDM  Iv ns. Labs.  Reviewed nursing notes and prior charts for additional history.   Pts charts from [facility] reviewed including initial ob/gyn visit - note made of hx interstitial cystitis, chronic pain/chronic pain med use.  Pt has had prior ultrasounds of pregnancy.   Pt requests/insists on pain medication.  Discussed limiting use in pregnancy. Requests pain med.  Dilaudid .5 mg iv.  Additional ivf.    Reviewed prior urine culture from 2 prior diagnosis and tx for uti - both neg. Will culture current urine.  As afeb, no cva tenderness, nitrite neg urine - will culture and hold rx for now (unless develops fever, new symptoms).    No lateralizing abd pain or tenderness on repeat exam. Afeb. Cbc normal.  Pt comfortable. No nv.   Pt tolerating po fluids. Pain relieved controlled in ED.  abd soft nt.    Pt requests limited quantity pain med rx for home as pain not controlled w tylenol.  Discussed initially trying tylenol for pain, and limiited narcotic pain med during pregnancy.  As states in pain/pain not controlled w tylenol at home, will give rx for hydrocodone #15.   Discussed importance close pcp/gyn follow up and to return to ER right away if new symptoms,  fevers, worsening or severe pain, persistent vomiting, or vaginal bleeding.   Pt appears stable for d/c.      Mirna Mires, MD 12/23/13 [phone]

## 2013-12-23 NOTE — Progress Notes (Signed)
Pt is a G3P1 [redacted]w[redacted]d gestation, who [place] because of history of PIH with first pregnancy. Has seen providers in the area but was sent to high risk clinic by an office in [place]. Complaining of low abd pain, some nausea and vomiting small amounts, no diarrhea. No other people in the house are sick. Pt states she has a hx of interstitial cycstitis and has had UTI's in the past. Also states baby is not moving as much today as it has been. FH via doppler 148, with baby active. No uterine activity palpated. No ROM of vag discharge.

## 2013-12-23 NOTE — Discharge Instructions (Signed)
It was our pleasure to provide your ER care today - we hope that you feel better.  Try tylenol for pain.    If your pain is not controlled with tylenol you may take hydrocodone as need for pain. No driving for the next 6 hours or when taking hydrocodone. Also, do not take tylenol or acetaminophen containing medication when taking hydrocodone.  Follow up with your doctor/ob gyn doctor in the next couple days for recheck.  We sent a urine culture the results of which will be back in 2 days time - have your doctor follow up on that result then.    Return to ER right away if worse, new symptoms, fevers, persistent vomiting, vaginal bleeding, other concern.     Interstitial Cystitis Interstitial cystitis (IC) is a condition that results in discomfort or pain in the bladder and the surrounding pelvic region. The symptoms can be different from case to case and even in the same individual. People may experience:  Mild discomfort.  Pressure.  Tenderness.  Intense pain in the bladder and pelvic area. CAUSES  Because IC varies so much in symptoms and severity, people studying this disease believe it is not one but several diseases. Some caregivers use the term painful bladder syndrome (PBS) to describe cases with painful urinary symptoms. This may not meet the strictest definition of IC. The term IC / PBS includes all cases of urinary pain that cannot be connected to other causes, such as infection or urinary stones.  SYMPTOMS  Symptoms may include:  An urgent need to urinate.  A frequent need to urinate.  A combination of these symptoms. Pain may change in intensity as the bladder fills with urine or as it empties. Women's symptoms often get worse during menstruation. They may sometimes experience pain with vaginal intercourse. Some of the symptoms of IC / PBS seem like those of bacterial infection. Tests do not show infection. IC / PBS is far more common in women than in men.  DIAGNOSIS    The diagnosis of IC / PBS is based on:  Presence of pain related to the bladder, usually along with problems of frequency and urgency.  Not finding other diseases that could cause the symptoms.  Diagnostic tests that help rule out other diseases include:  Urinalysis.  Urine culture.  Cystoscopy.  Biopsy of the bladder wall.  Distension of the bladder under anesthesia.  Urine cytology.  Laboratory examination of prostate secretions. A biopsy is a tissue sample that can be looked at under a microscope. Samples of the bladder and urethra may be removed during a cystoscopy. A biopsy helps rule out bladder cancer. TREATMENT  Scientists have not yet found a cure for IC / PBS. Patients with IC / PBS do not get better with antibiotic therapy. Caregivers cannot predict who will respond best to which treatment. Symptoms may disappear without explanation. Disappearing symptoms may coincide with an event such as a change in diet or treatment. Even when symptoms disappear, they may return after days, weeks, months, or years.  Because the causes of IC / PBS are unknown, current treatments are aimed at relieving symptoms. Many people are helped by one or a combination of the treatments. As researchers learn more about IC / PBS, the list of potential treatments will change. Patients should discuss their options with a caregiver. SURGERY  Surgery should be considered only if all available treatments have failed and the pain is disabling. Many approaches and techniques are used. Each approach  has its own advantages and complications. Advantages and complications should be discussed with a urologist. Your caregiver may recommend consulting another urologist for a second opinion. Most caregivers are reluctant to operate because the outcome is unpredictable. Some people still have symptoms after surgery.  People considering surgery should discuss the potential risks and benefits, side effects, and long-  and short-term complications with their family, as well as with people who have already had the procedure. Surgery requires anesthesia, hospitalization, and in some cases weeks or months of recovery. As the complexity of the procedure increases, so do the chances for complications and for failure. HOME CARE INSTRUCTIONS   All drugs, even those sold over the counter, have side effects. Patients should always consult a caregiver before using any drug for an extended amount of time. Only take over-the-counter or prescription medicines for pain, discomfort, or fever as directed by your caregiver.  Many patients feel that smoking makes their symptoms worse. How the by-products of tobacco that are excreted in the urine affect IC / PBS is unknown. Smoking is the major known cause of bladder cancer. One of the best things smokers can do for their bladder and their overall health is to quit.  Many patients feel that gentle stretching exercises help relieve IC / PBS symptoms.  Methods vary, but basically patients decide to empty their bladder at designated times and use relaxation techniques and distractions to keep to the schedule. Gradually, patients try to lengthen the time between scheduled voids. A diary in which to record voiding times is usually helpful in keeping track of progress. MAKE SURE YOU:   Understand these instructions.  Will watch your condition.  Will get help right away if you are not doing well or get worse. Document Released: 10/27/2003 Document Revised: 05/20/2011 Document Reviewed: 01/11/2008 [facility] Patient Information 2015 [place], [place]. This information is not intended to replace advice given to you by your health care provider. Make sure you discuss any questions you have with your health care provider.    Abdominal Pain, Women Abdominal (stomach, pelvic, or belly) pain can be caused by many things. It is important to tell your doctor:  The location of the pain.  Does  it come and go or is it present all the time?  Are there things that start the pain (eating certain foods, exercise)?  Are there other symptoms associated with the pain (fever, nausea, vomiting, diarrhea)? All of this is helpful to know when trying to find the cause of the pain. CAUSES   Stomach: virus or bacteria infection, or ulcer.  Intestine: appendicitis (inflamed appendix), regional ileitis (Crohn's disease), ulcerative colitis (inflamed colon), irritable bowel syndrome, diverticulitis (inflamed diverticulum of the colon), or cancer of the stomach or intestine.  Gallbladder disease or stones in the gallbladder.  Kidney disease, kidney stones, or infection.  Pancreas infection or cancer.  Fibromyalgia (pain disorder).  Diseases of the female organs:  Uterus: fibroid (non-cancerous) tumors or infection.  Fallopian tubes: infection or tubal pregnancy.  Ovary: cysts or tumors.  Pelvic adhesions (scar tissue).  Endometriosis (uterus lining tissue growing in the pelvis and on the pelvic organs).  Pelvic congestion syndrome (female organs filling up with blood just before the menstrual period).  Pain with the menstrual period.  Pain with ovulation (producing an egg).  Pain with an IUD (intrauterine device, birth control) in the uterus.  Cancer of the female organs.  Functional pain (pain not caused by a disease, may improve without treatment).  Psychological pain.  Depression. DIAGNOSIS  Your doctor will decide the seriousness of your pain by doing an examination.  Blood tests.  X-rays.  Ultrasound.  CT scan (computed tomography, special type of X-ray).  MRI (magnetic resonance imaging).  Cultures, for infection.  Barium enema (dye inserted in the large intestine, to better view it with X-rays).  Colonoscopy (looking in intestine with a lighted tube).  Laparoscopy (minor surgery, looking in abdomen with a lighted tube).  Major abdominal exploratory  surgery (looking in abdomen with a large incision). TREATMENT  The treatment will depend on the cause of the pain.   Many cases can be observed and treated at home.  Over-the-counter medicines recommended by your caregiver.  Prescription medicine.  Antibiotics, for infection.  Birth control pills, for painful periods or for ovulation pain.  Hormone treatment, for endometriosis.  Nerve blocking injections.  Physical therapy.  Antidepressants.  Counseling with a psychologist or psychiatrist.  Minor or major surgery. HOME CARE INSTRUCTIONS   Do not take laxatives, unless directed by your caregiver.  Take over-the-counter pain medicine only if ordered by your caregiver. Do not take aspirin because it can cause an upset stomach or bleeding.  Try a clear liquid diet (broth or water) as ordered by your caregiver. Slowly move to a bland diet, as tolerated, if the pain is related to the stomach or intestine.  Have a thermometer and take your temperature several times a day, and record it.  Bed rest and sleep, if it helps the pain.  Avoid sexual intercourse, if it causes pain.  Avoid stressful situations.  Keep your follow-up appointments and tests, as your caregiver orders.  If the pain does not go away with medicine or surgery, you may try:  Acupuncture.  Relaxation exercises (yoga, meditation).  Group therapy.  Counseling. SEEK MEDICAL CARE IF:   You notice certain foods cause stomach pain.  Your home care treatment is not helping your pain.  You need stronger pain medicine.  You want your IUD removed.  You feel faint or lightheaded.  You develop nausea and vomiting.  You develop a rash.  You are having side effects or an allergy to your medicine. SEEK IMMEDIATE MEDICAL CARE IF:   Your pain does not go away or gets worse.  You have a fever.  Your pain is felt only in portions of the abdomen. The right side could possibly be appendicitis. The left  lower portion of the abdomen could be colitis or diverticulitis.  You are passing blood in your stools (bright red or black tarry stools, with or without vomiting).  You have blood in your urine.  You develop chills, with or without a fever.  You pass out. MAKE SURE YOU:   Understand these instructions.  Will watch your condition.  Will get help right away if you are not doing well or get worse. Document Released: 12/23/2006 Document Revised: 07/12/2013 Document Reviewed: 01/12/2009 [facility] Patient Information 2015 [place], [place]. This information is not intended to replace advice given to you by your health care provider. Make sure you discuss any questions you have with your health care provider.    Abdominal Pain During Pregnancy Belly (abdominal) pain is common during pregnancy. Most of the time, it is not a serious problem. Other times, it can be a sign that something is wrong with the pregnancy. Always tell your doctor if you have belly pain. HOME CARE Monitor your belly pain for any changes. The following actions may help you feel better:  Do  not have sex (intercourse) or put anything in your vagina until you feel better.  Rest until your pain stops.  Drink clear fluids if you feel sick to your stomach (nauseous). Do not eat solid food until you feel better.  Only take medicine as told by your doctor.  Keep all doctor visits as told. GET HELP RIGHT AWAY IF:   You are bleeding, leaking fluid, or pieces of tissue come out of your vagina.  You have more pain or cramping.  You keep throwing up (vomiting).  You have pain when you pee (urinate) or have blood in your pee.  You have a fever.  You do not feel your baby moving as much.  You feel very weak or feel like passing out.  You have trouble breathing, with or without belly pain.  You have a very bad headache and belly pain.  You have fluid leaking from your vagina and belly pain.  You keep having watery  poop (diarrhea).  Your belly pain does not go away after resting, or the pain gets worse. MAKE SURE YOU:   Understand these instructions.  Will watch your condition.  Will get help right away if you are not doing well or get worse. Document Released: 02/13/2009 Document Revised: 10/28/2012 Document Reviewed: 09/24/2012 [facility] Patient Information 2015 [place], [place]. This information is not intended to replace advice given to you by your health care provider. Make sure you discuss any questions you have with your health care provider.

## 2013-12-23 NOTE — Progress Notes (Signed)
Dr Sherryl MangesJ Ferguson informed or pt's status. Pt cleared obstetrically. Reviewed signs of PTL and ROM. Pt instructed to keep appointment scheduled in two weeks, at her OB office.

## 2013-12-23 NOTE — ED Notes (Signed)
Rapid OB at bedside with patient.

## 2013-12-23 NOTE — ED Notes (Signed)
Patient states bad abdominal pain and back pain.   Patient states is [redacted] wks pregnant and hasn't felt baby kick since yesterday.  Patient states a lot of pressure and burning when she urinates.   Patient denies any bleeding at this time.  Patient states has been vomiting and had trouble eating for 2 x days.

## 2013-12-24 LAB — URINE CULTURE: Colony Count: 50000

## 2014-01-10 ENCOUNTER — Encounter (HOSPITAL_COMMUNITY): Payer: Self-pay | Admitting: Emergency Medicine

## 2014-02-25 LAB — OB RESULTS CONSOLE HIV ANTIBODY (ROUTINE TESTING): HIV: NONREACTIVE

## 2014-02-25 LAB — GLUCOSE TOLERANCE, 1 HOUR (50G) W/O FASTING: Glucose, GTT - 1 Hour: 77 mg/dL (ref ?–200)

## 2014-02-25 LAB — OB RESULTS CONSOLE RPR: RPR: NONREACTIVE

## 2014-02-25 LAB — OB RESULTS CONSOLE HEPATITIS B SURFACE ANTIGEN: Hepatitis B Surface Ag: NEGATIVE

## 2014-02-25 LAB — OB RESULTS CONSOLE RUBELLA ANTIBODY, IGM: Rubella: IMMUNE

## 2014-03-01 ENCOUNTER — Encounter: Payer: Self-pay | Admitting: Obstetrics and Gynecology

## 2014-03-01 ENCOUNTER — Ambulatory Visit (INDEPENDENT_AMBULATORY_CARE_PROVIDER_SITE_OTHER): Payer: Medicaid Other | Admitting: Obstetrics and Gynecology

## 2014-03-01 VITALS — BP 115/68 | HR 78 | Temp 97.9°F | Wt 176.6 lb

## 2014-03-01 DIAGNOSIS — F17200 Nicotine dependence, unspecified, uncomplicated: Secondary | ICD-10-CM | POA: Insufficient documentation

## 2014-03-01 DIAGNOSIS — O09299 Supervision of pregnancy with other poor reproductive or obstetric history, unspecified trimester: Secondary | ICD-10-CM | POA: Insufficient documentation

## 2014-03-01 DIAGNOSIS — O2301 Infections of kidney in pregnancy, first trimester: Secondary | ICD-10-CM

## 2014-03-01 DIAGNOSIS — Z720 Tobacco use: Secondary | ICD-10-CM

## 2014-03-01 DIAGNOSIS — O09293 Supervision of pregnancy with other poor reproductive or obstetric history, third trimester: Secondary | ICD-10-CM

## 2014-03-01 DIAGNOSIS — F1910 Other psychoactive substance abuse, uncomplicated: Secondary | ICD-10-CM

## 2014-03-01 DIAGNOSIS — F1220 Cannabis dependence, uncomplicated: Secondary | ICD-10-CM

## 2014-03-01 DIAGNOSIS — F1290 Cannabis use, unspecified, uncomplicated: Secondary | ICD-10-CM

## 2014-03-01 DIAGNOSIS — N12 Tubulo-interstitial nephritis, not specified as acute or chronic: Secondary | ICD-10-CM

## 2014-03-01 DIAGNOSIS — O99323 Drug use complicating pregnancy, third trimester: Secondary | ICD-10-CM | POA: Insufficient documentation

## 2014-03-01 DIAGNOSIS — O0993 Supervision of high risk pregnancy, unspecified, third trimester: Secondary | ICD-10-CM

## 2014-03-01 LAB — POCT URINALYSIS DIP (DEVICE)
Bilirubin Urine: NEGATIVE
Glucose, UA: NEGATIVE mg/dL
Hgb urine dipstick: NEGATIVE
Ketones, ur: NEGATIVE mg/dL
Nitrite: NEGATIVE
Protein, ur: NEGATIVE mg/dL
Specific Gravity, Urine: 1.01 (ref 1.005–1.030)
Urobilinogen, UA: 0.2 mg/dL (ref 0.0–1.0)
pH: 6.5 (ref 5.0–8.0)

## 2014-03-01 NOTE — Progress Notes (Signed)
Patient transferred care from [facility]. No records available for review. 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 Patient was also hospitalized in July secondary to pyelonephritis. She has relocated to the [place] area and is looking to establish care there. She had her 1 hr GCT and tdap on 12/18 as well as a an ultrasound that day and states that the fetus was growing well without issues. Patient is doing well today without any questions or concerns. Will try to coordinate NICU tour for possible [facility] admission. Patient is interested in circumcision, info provide. Patient is interested in [facility] for contraception. Patient had preeclampsia with the first pregnancy and is currently taking ASA 81 mg daily.

## 2014-03-01 NOTE — Progress Notes (Signed)
Transfer from [facility] because of distance. Release of information signed and records requested.  Pt states she was last seen on 02/25/14.

## 2014-03-06 ENCOUNTER — Inpatient Hospital Stay (HOSPITAL_COMMUNITY)
Admission: EM | Admit: 2014-03-06 | Discharge: 2014-03-09 | DRG: 781 | Disposition: A | Discharge status: Home / Self Care | Payer: Medicaid Other | Attending: Obstetrics and Gynecology | Admitting: Obstetrics and Gynecology

## 2014-03-06 ENCOUNTER — Emergency Department (HOSPITAL_COMMUNITY): Payer: Medicaid Other

## 2014-03-06 ENCOUNTER — Encounter (HOSPITAL_COMMUNITY): Payer: Self-pay

## 2014-03-06 DIAGNOSIS — O99513 Diseases of the respiratory system complicating pregnancy, third trimester: Principal | ICD-10-CM | POA: Diagnosis present

## 2014-03-06 DIAGNOSIS — Z3A30 30 weeks gestation of pregnancy: Secondary | ICD-10-CM | POA: Diagnosis present

## 2014-03-06 DIAGNOSIS — R0789 Other chest pain: Secondary | ICD-10-CM

## 2014-03-06 DIAGNOSIS — F17210 Nicotine dependence, cigarettes, uncomplicated: Secondary | ICD-10-CM | POA: Diagnosis present

## 2014-03-06 DIAGNOSIS — O99323 Drug use complicating pregnancy, third trimester: Secondary | ICD-10-CM

## 2014-03-06 DIAGNOSIS — O99333 Smoking (tobacco) complicating pregnancy, third trimester: Secondary | ICD-10-CM | POA: Diagnosis present

## 2014-03-06 DIAGNOSIS — F1210 Cannabis abuse, uncomplicated: Secondary | ICD-10-CM | POA: Diagnosis present

## 2014-03-06 DIAGNOSIS — J189 Pneumonia, unspecified organism: Secondary | ICD-10-CM | POA: Diagnosis present

## 2014-03-06 DIAGNOSIS — R0902 Hypoxemia: Secondary | ICD-10-CM

## 2014-03-06 DIAGNOSIS — O0993 Supervision of high risk pregnancy, unspecified, third trimester: Secondary | ICD-10-CM

## 2014-03-06 DIAGNOSIS — R079 Chest pain, unspecified: Secondary | ICD-10-CM

## 2014-03-06 DIAGNOSIS — O99519 Diseases of the respiratory system complicating pregnancy, unspecified trimester: Secondary | ICD-10-CM | POA: Diagnosis present

## 2014-03-06 LAB — BASIC METABOLIC PANEL
Anion gap: 9 (ref 5–15)
BUN: <5 mg/dL — ABNORMAL LOW (ref 6–23)
CO2: 21 mmol/L (ref 19–32)
Calcium: 9 mg/dL (ref 8.4–10.5)
Chloride: 105 mEq/L (ref 96–112)
Creatinine, Ser: 0.48 mg/dL — ABNORMAL LOW (ref 0.50–1.10)
GFR calc Af Amer: >90 mL/min (ref >90)
GFR calc non Af Amer: >90 mL/min (ref >90)
Glucose, Bld: 96 mg/dL (ref 70–99)
Potassium: 3.6 mmol/L (ref 3.5–5.1)
Sodium: 135 mmol/L (ref 135–145)

## 2014-03-06 LAB — CBC
HCT: 41.3 % (ref 36.0–46.0)
Hemoglobin: 14.6 g/dL (ref 12.0–15.0)
MCH: 34.1 pg — ABNORMAL HIGH (ref 26.0–34.0)
MCHC: 35.4 g/dL (ref 30.0–36.0)
MCV: 96.5 fL (ref 78.0–100.0)
Platelets: 134 10*3/uL — ABNORMAL LOW (ref 150–400)
RBC: 4.28 MIL/uL (ref 3.87–5.11)
RDW: 12 % (ref 11.5–15.5)
WBC: 10.6 10*3/uL — ABNORMAL HIGH (ref 4.0–10.5)

## 2014-03-06 LAB — BLOOD GAS, ARTERIAL
Acid-base deficit: 3.3 mmol/L — ABNORMAL HIGH (ref 0.0–2.0)
Bicarbonate: 19.5 mEq/L — ABNORMAL LOW (ref 20.0–24.0)
Drawn by: 14426
FIO2: 1 %
O2 Content: 2 L/min
O2 Saturation: 96 %
TCO2: 20.5 mmol/L (ref 0–100)
pCO2 arterial: 30.6 mmHg — ABNORMAL LOW (ref 35.0–45.0)
pH, Arterial: 7.422 (ref 7.350–7.450)
pO2, Arterial: 76 mmHg — ABNORMAL LOW (ref 80.0–100.0)

## 2014-03-06 LAB — I-STAT TROPONIN, ED: Troponin i, poc: 0 ng/mL (ref 0.00–0.08)

## 2014-03-06 MED ORDER — ALBUTEROL SULFATE (2.5 MG/3ML) 0.083% IN NEBU
2.5000 mg | INHALATION_SOLUTION | RESPIRATORY_TRACT | Status: DC | PRN
  Administered 2014-03-07 – 2014-03-09 (×11): 2.5 mg via RESPIRATORY_TRACT
  Filled 2014-03-06 (×12): qty 3

## 2014-03-06 MED ORDER — SODIUM CHLORIDE 0.9 % IV SOLN
INTRAVENOUS | Status: DC
  Administered 2014-03-06 – 2014-03-09 (×6): via INTRAVENOUS

## 2014-03-06 MED ORDER — CALCIUM CARBONATE ANTACID 500 MG PO CHEW: 2.0000 | CHEWABLE_TABLET | ORAL | Status: DC | PRN

## 2014-03-06 MED ORDER — PRENATAL MULTIVITAMIN CH
1.0000 | ORAL_TABLET | Freq: Every day | ORAL | Status: DC
  Filled 2014-03-06 (×3): qty 1

## 2014-03-06 MED ORDER — DOCUSATE SODIUM 100 MG PO CAPS
100.0000 mg | ORAL_CAPSULE | Freq: Every day | ORAL | Status: DC
  Administered 2014-03-07 – 2014-03-09 (×3): 100 mg via ORAL
  Filled 2014-03-06 (×4): qty 1

## 2014-03-06 MED ORDER — CEFTRIAXONE SODIUM IN DEXTROSE 20 MG/ML IV SOLN
1.0000 g | Freq: Two times a day (BID) | INTRAVENOUS | Status: DC
  Administered 2014-03-06 – 2014-03-09 (×6): 1 g via INTRAVENOUS
  Filled 2014-03-06 (×7): qty 50

## 2014-03-06 MED ORDER — ALBUTEROL SULFATE (2.5 MG/3ML) 0.083% IN NEBU
2.5000 mg | INHALATION_SOLUTION | RESPIRATORY_TRACT | Status: AC
  Administered 2014-03-06 – 2014-03-07 (×6): 2.5 mg via RESPIRATORY_TRACT
  Filled 2014-03-06 (×6): qty 3

## 2014-03-06 MED ORDER — METHADONE HCL 10 MG PO TABS
50.0000 mg | ORAL_TABLET | Freq: Every day | ORAL | Status: DC
  Administered 2014-03-06 – 2014-03-09 (×4): 50 mg via ORAL
  Filled 2014-03-06 (×4): qty 5

## 2014-03-06 MED ORDER — ZOLPIDEM TARTRATE 5 MG PO TABS
5.0000 mg | ORAL_TABLET | Freq: Every evening | ORAL | Status: DC | PRN
  Administered 2014-03-08: 5 mg via ORAL
  Filled 2014-03-06: qty 1

## 2014-03-06 MED ORDER — DEXTROSE 5 % IV SOLN
2.0000 g | INTRAVENOUS | Status: DC
  Filled 2014-03-06: qty 2

## 2014-03-06 MED ORDER — ASPIRIN EC 81 MG PO TBEC
81.0000 mg | DELAYED_RELEASE_TABLET | Freq: Every day | ORAL | Status: DC
  Administered 2014-03-06 – 2014-03-09 (×4): 81 mg via ORAL
  Filled 2014-03-06 (×5): qty 1

## 2014-03-06 MED ORDER — ALBUTEROL SULFATE (2.5 MG/3ML) 0.083% IN NEBU
2.5000 mg | INHALATION_SOLUTION | Freq: Once | RESPIRATORY_TRACT | Status: AC
  Administered 2014-03-06: 2.5 mg via RESPIRATORY_TRACT
  Filled 2014-03-06: qty 3

## 2014-03-06 MED ORDER — ACETAMINOPHEN 325 MG PO TABS
650.0000 mg | ORAL_TABLET | ORAL | Status: DC | PRN
  Administered 2014-03-07 – 2014-03-09 (×5): 650 mg via ORAL
  Filled 2014-03-06 (×6): qty 2

## 2014-03-06 MED ORDER — OXYBUTYNIN CHLORIDE 5 MG PO TABS
5.0000 mg | ORAL_TABLET | Freq: Two times a day (BID) | ORAL | Status: DC
  Administered 2014-03-06 – 2014-03-09 (×6): 5 mg via ORAL
  Filled 2014-03-06 (×9): qty 1

## 2014-03-06 MED ORDER — ONDANSETRON 8 MG/NS 50 ML IVPB
8.0000 mg | Freq: Four times a day (QID) | INTRAVENOUS | Status: DC | PRN
Start: 2014-03-06 — End: 2014-03-09
  Administered 2014-03-06 – 2014-03-07 (×2): 8 mg via INTRAVENOUS
  Filled 2014-03-06 (×4): qty 8

## 2014-03-06 MED ORDER — FAMOTIDINE 20 MG PO TABS
40.0000 mg | ORAL_TABLET | Freq: Every day | ORAL | Status: DC
  Administered 2014-03-06 – 2014-03-08 (×3): 40 mg via ORAL
  Filled 2014-03-06 (×5): qty 2

## 2014-03-06 MED ORDER — GUAIFENESIN ER 600 MG PO TB12
600.0000 mg | ORAL_TABLET | Freq: Two times a day (BID) | ORAL | Status: DC
  Administered 2014-03-06 – 2014-03-09 (×6): 600 mg via ORAL
  Filled 2014-03-06 (×8): qty 1

## 2014-03-06 MED ORDER — DEXTROSE 5 % IV SOLN: 2.0000 g | INTRAVENOUS | Status: DC

## 2014-03-06 MED ORDER — ASPIRIN 81 MG PO TABS
81.0000 mg | ORAL_TABLET | Freq: Every day | ORAL | Status: DC
  Filled 2014-03-06 (×2): qty 1

## 2014-03-06 MED ORDER — DEXTROSE 5 % IV SOLN
500.0000 mg | INTRAVENOUS | Status: DC
  Administered 2014-03-06 – 2014-03-09 (×4): 500 mg via INTRAVENOUS
  Filled 2014-03-06 (×4): qty 500

## 2014-03-06 NOTE — ED Notes (Signed)
Rapid Response OB nurse at bedside.

## 2014-03-06 NOTE — Progress Notes (Signed)
Pt transferred to [facility] by [facility]. 

## 2014-03-06 NOTE — H&P (Signed)
FACULTY PRACTICE ANTEPARTUM ADMISSION HISTORY AND PHYSICAL NOTE   History of Present Illness: Victoria Alvarez is a 23 y.o. G3P1011 at [redacted]w[redacted]d admitted for pneumonia Patient reports the fetal movement as active. Patient reports uterine contraction  activity as none. Patient reports  vaginal bleeding as none. Patient describes fluid per vagina as None. Fetal presentation is cephalic.  Patient Active Problem List   Diagnosis Date Noted  . Pneumonia complicating pregnancy 03/06/2014  . Supervision of high risk pregnancy in third trimester 03/01/2014  . Substance abuse affecting pregnancy in third trimester, antepartum 03/01/2014  . Marijuana smoker 03/01/2014  . Current smoker 03/01/2014  . Hx of preeclampsia, prior pregnancy, currently pregnant 03/01/2014  . Pyelonephritis 10/27/2013  . Pyelonephritis affecting pregnancy in first trimester, antepartum 09/25/2013     Past Medical History  Diagnosis Date  . Interstitial cystitis   . Chronic pelvic pain in female   . Headache(784.0)   . Infection     UTI  . Pyelonephritis affecting pregnancy in first trimester July 2015  . Depression 2007     Past Surgical History  Procedure Laterality Date  . Hernia repair    . Vagina surgery      tumor removed, went to cancer center in [place]     OB History    Gravida Para Term Preterm AB TAB SAB Ectopic Multiple Living   3 1 1  0 1 0 1 0 0 1      History   Social History  . Marital Status: Single    Spouse Name: N/A    Number of Children: N/A  . Years of Education: N/A   Social History Main Topics  . Smoking status: Current Every Day Smoker -- 0.50 packs/day for 8 years    Types: Cigarettes  . Smokeless tobacco: None     Comment: cutting back  . Alcohol Use: No  . Drug Use: Yes    Special: Marijuana, Other-see comments     Comment: Pt on Methadone  . Sexual Activity: Yes    Birth Control/ Protection: None   Other Topics Concern  . None   Social History Narrative     Family History  Problem Relation Age of Onset  . Diabetes Mother   . Heart disease Mother   . Hypertension Father   . Diabetes Maternal Grandmother   . Hypertension Maternal Grandmother   . Stroke Maternal Grandmother   . Diabetes Maternal Grandfather   . Hypertension Maternal Grandfather   . Diabetes Paternal Grandmother   . Diabetes Paternal Grandfather     Allergies  Allergen Reactions  . Ciprofloxacin Diarrhea and Nausea And Vomiting    Prescriptions prior to admission  Medication Sig Dispense Refill Last Dose  . aspirin 81 MG tablet Take 81 mg by mouth daily.   03/05/2014 at Unknown time  . methadone (DOLOPHINE) 10 MG/ML solution Take 50 mg by mouth daily.   03/05/2014 at Unknown time  . ondansetron (ZOFRAN) 8 MG tablet Take 1 tablet (8 mg total) by mouth every 8 (eight) hours as needed for nausea or vomiting. 30 tablet 1 03/05/2014 at Unknown time  . oxybutynin (DITROPAN) 5 MG tablet Take 5 mg by mouth 2 (two) times daily.   03/05/2014 at Unknown time  . Prenatal Vit-Fe Fumarate-FA (PRENATAL MULTIVITAMIN) TABS tablet Take 1 tablet by mouth daily at 12 noon. 30 tablet 3 03/05/2014 at Unknown time  . HYDROcodone-acetaminophen (NORCO/VICODIN) 5-325 MG per tablet Take 1 tablet by mouth every 6 (six) hours as needed  for moderate pain. (Patient not taking: Reported on 03/01/2014) 15 tablet 0 Not Taking    . albuterol  2.5 mg Nebulization Q4H  . aspirin EC  81 mg Oral Daily  . azithromycin  500 mg Intravenous Q24H  . cefTRIAXone (ROCEPHIN)  IV  1 g Intravenous Q12H  . docusate sodium  100 mg Oral Daily  . famotidine  40 mg Oral QHS  . methadone  50 mg Oral Daily  . oxybutynin  5 mg Oral BID  . prenatal multivitamin  1 tablet Oral Q1200   I have reviewed the patient's current medications. Prior to Admission:  Prescriptions prior to admission  Medication Sig Dispense Refill Last Dose  . aspirin 81 MG tablet Take 81 mg by mouth daily.   03/05/2014 at Unknown time  .  methadone (DOLOPHINE) 10 MG/ML solution Take 50 mg by mouth daily.   03/05/2014 at Unknown time  . ondansetron (ZOFRAN) 8 MG tablet Take 1 tablet (8 mg total) by mouth every 8 (eight) hours as needed for nausea or vomiting. 30 tablet 1 03/05/2014 at Unknown time  . oxybutynin (DITROPAN) 5 MG tablet Take 5 mg by mouth 2 (two) times daily.   03/05/2014 at Unknown time  . Prenatal Vit-Fe Fumarate-FA (PRENATAL MULTIVITAMIN) TABS tablet Take 1 tablet by mouth daily at 12 noon. 30 tablet 3 03/05/2014 at Unknown time  . HYDROcodone-acetaminophen (NORCO/VICODIN) 5-325 MG per tablet Take 1 tablet by mouth every 6 (six) hours as needed for moderate pain. (Patient not taking: Reported on 03/01/2014) 15 tablet 0 Not Taking   Scheduled: . albuterol  2.5 mg Nebulization Q4H  . aspirin EC  81 mg Oral Daily  . azithromycin  500 mg Intravenous Q24H  . cefTRIAXone (ROCEPHIN)  IV  1 g Intravenous Q12H  . docusate sodium  100 mg Oral Daily  . famotidine  40 mg Oral QHS  . methadone  50 mg Oral Daily  . oxybutynin  5 mg Oral BID  . prenatal multivitamin  1 tablet Oral Q1200   Continuous:   Review of Systems - General ROS: malaise, no fever/sweats/chills Hematological and Lymphatic ROS: negative Endocrine ROS: negative Respiratory ROS: SOB, cough, wheezing Cardiovascular ROS: no chest pain or dyspnea on exertion Gastrointestinal ROS: no abdominal pain, change in bowel habits, or black or bloody stools +nausea Genito-Urinary ROS: no dysuria, trouble voiding, or hematuria Musculoskeletal ROS: negative Neurological ROS: no headache  Vitals:  BP 101/61 mmHg  Pulse 89  Temp(Src) 98.4 F (36.9 C) (Oral)  Resp 24  Ht 5\' 2"  (1.575 m)  Wt 172 lb (78.019 kg)  BMI 31.45 kg/m2  SpO2 98%  LMP 09/24/2013 Physical Examination:  General appearance - alert, calm, mildly ill appearing Mental status - alert, oriented to person, place, and time Heart - normal rate Lungs: moderate respiratory distress, increased  efforts, no retractions while wearing [place] O2, decreased breath sounds left upper lung, wheezing all long fields Abdomen - soft, nontender, nondistended, no masses or organomegaly Back exam - full range of motion, no tenderness, palpable spasm or pain on motion Neurological - alert, oriented, normal speech, no focal findings or movement disorder noted Musculoskeletal - no joint tenderness, deformity or swelling Extremities - peripheral pulses normal, no pedal edema, no clubbing or cyanosis Skin - normal coloration and turgor, no rashes, no suspicious skin lesions noted Abdomen: gravid  Extremities: extremities normal, atraumatic, no cyanosis or edema Membranes:intact Cephalic via sono  Labs:  Results for orders placed or performed during the hospital  encounter of 03/06/14 (from the past 24 hour(s))  CBC   Collection Time: 03/06/14  9:50 AM  Result Value Ref Range   WBC 10.6 (H) 4.0 - 10.5 K/uL   RBC 4.28 3.87 - 5.11 MIL/uL   Hemoglobin 14.6 12.0 - 15.0 g/dL   HCT 32.241.3 02.536.0 - 42.746.0 %   MCV 96.5 78.0 - 100.0 fL   MCH 34.1 (H) 26.0 - 34.0 pg   MCHC 35.4 30.0 - 36.0 g/dL   RDW 06.212.0 37.611.5 - 28.315.5 %   Platelets 134 (L) 150 - 400 K/uL  Basic metabolic panel   Collection Time: 03/06/14  9:50 AM  Result Value Ref Range   Sodium 135 135 - 145 mmol/L   Potassium 3.6 3.5 - 5.1 mmol/L   Chloride 105 96 - 112 mEq/L   CO2 21 19 - 32 mmol/L   Glucose, Bld 96 70 - 99 mg/dL   BUN <5 (L) 6 - 23 mg/dL   Creatinine, Ser 1.510.48 (L) 0.50 - 1.10 mg/dL   Calcium 9.0 8.4 - 76.110.5 mg/dL   GFR calc non Af Amer >90 >90 mL/min   GFR calc Af Amer >90 >90 mL/min   Anion gap 9 5 - 15  I-stat troponin, ED (not at [facility])   Collection Time: 03/06/14 10:15 AM  Result Value Ref Range   Troponin i, poc 0.00 0.00 - 0.08 ng/mL   Comment 3            Imaging Studies: Dg Chest 2 View  03/06/2014   CLINICAL DATA:  Short of breath.  Thirty weeks pregnant.  EXAM: CHEST  2 VIEW  COMPARISON:  None.  FINDINGS: Central left  upper lobe airspace disease. Right lung is clear. Normal heart size. No pneumothorax or pleural effusion.  IMPRESSION: Left upper lobe airspace disease.   Electronically Signed   By: Maryclare BeanArt  Hoss M.D.   On: 03/06/2014 11:30     Assessment and Plan: Patient Active Problem List   Diagnosis Date Noted  . Pneumonia complicating pregnancy 03/06/2014  . Supervision of high risk pregnancy in third trimester 03/01/2014  . Substance abuse affecting pregnancy in third trimester, antepartum 03/01/2014  . Marijuana smoker 03/01/2014  . Current smoker 03/01/2014  . Hx of preeclampsia, prior pregnancy, currently pregnant 03/01/2014  . Pyelonephritis 10/27/2013  . Pyelonephritis affecting pregnancy in first trimester, antepartum 09/25/2013   1. Pneumonia: - do not believe this is HCAP as previously diagnosed in ER, this is likely CAP and therefore will discontinue cefepime and start rocephin/azithromycin.  If patient does not improve as expected in 48 will change antibiotics.  If any deterioration would consider changing them as well - q4h [id number] => readdress if scheduled necessary tomorrow, q4h prn albuterol - pepcid qHS - continuous pulse ox, baseline ABG - CBC in AM - saline lock when adequate PO hydration, caution with IV fluids to avoid volume overload  2. Methadone: restart home dose   Perry MountACOSTA,Seferino Oscar ROCIO, MD  OB fellow Faculty Practice, [facility]

## 2014-03-06 NOTE — ED Notes (Signed)
Notified [facility] for transportation to [facility]

## 2014-03-06 NOTE — ED Notes (Signed)
Pt here for SOB that started yesterday with a productive cough, states she coughed up green stuff and it looked disgusting. Pt is [redacted] weeks pregnant and denies contractions, vaginal leakage, or bleeding. Just states she doesn't feel good.

## 2014-03-06 NOTE — Progress Notes (Addendum)
Pt is a G3P1011 at 30 6/[redacted] weeks gestation with C/O shortness of breath that started last night and became progressively worse. Pt denies vaginal bleeding, leaking of fluid, or uc's. Says she had preeclampsia with her 1st pregnancy, but has had no problems with this pregnancy. BP is 122/57. Pt says she has a headache but denies seeing spots or having blurred vision. Reflexes are 1 plus no clonus. No wheezes heard when auscultating pt's lungs. Pt has a non productive cough. Says she does not have a cold and denies having any respiratory systems in the past 2 weeks. Says she is taking methadone. Pt is on 2 liters of 02 by nasal cannula. 02 sat is 92%. Resp are anywhere from 20-31 per min.

## 2014-03-06 NOTE — ED Provider Notes (Signed)
CSN: [id number]     Arrival date & time 03/06/14  4098 History   First MD Initiated Contact with Patient 03/06/14 [phone]     Chief Complaint  Patient presents with  . Cough    pregnant  . Shortness of Breath     (Consider location/radiation/quality/duration/timing/severity/associated sxs/prior Treatment) HPI Comments: The patient is a 23 year old G2 P1 currently [redacted] weeks pregnant, history of preeclampsia presenting to the emergency room chief complaint of cough and shortness of breath since last night. Patient reports she was sent by methadone clinic due to shortness of breath. She reports sore throat yesterday morning, nasal congestion. She reports chest discomfort as tightness. She denies lower tremor the edema, fever. She reports associated wheezing. Reports child at home with "pnumonia". No recent travel, family history or personal history of DVT/PE, lower extremity swelling, cancer. Patient reports normal baby movement, no abdominal discomfort, no abnormal vaginal discharge or sensation of wetting pants. 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  Last methadone yesterday.  Patient is a 23 y.o. female presenting with cough and shortness of breath. The history is provided by the patient. No language interpreter was used.  Cough Associated symptoms: shortness of breath and sore throat   Associated symptoms: no fever   Shortness of Breath Associated symptoms: cough and sore throat   Associated symptoms: no abdominal pain and no fever     Past Medical History  Diagnosis Date  . Interstitial cystitis   . Chronic pelvic pain in female   . Headache(784.0)   . Infection     UTI  . Pyelonephritis affecting pregnancy in first trimester July 2015  . Depression 2007   Past Surgical History  Procedure Laterality Date  . Hernia repair    . Vagina surgery      tumor removed, went to cancer center in [place]   Family History  Problem  Relation Age of Onset  . Diabetes Mother   . Heart disease Mother   . Hypertension Father   . Diabetes Maternal Grandmother   . Hypertension Maternal Grandmother   . Stroke Maternal Grandmother   . Diabetes Maternal Grandfather   . Hypertension Maternal Grandfather   . Diabetes Paternal Grandmother   . Diabetes Paternal Grandfather    History  Substance Use Topics  . Smoking status: Current Every Day Smoker -- 8 years    Types: Cigarettes  . Smokeless tobacco: Not on file     Comment: cutting back  . Alcohol Use: No   OB History    Gravida Para Term Preterm AB TAB SAB Ectopic Multiple Living   3 1 1  0 1 0 1 0 0 1     Review of Systems  Constitutional: Negative for fever.  HENT: Positive for congestion and sore throat.   Respiratory: Positive for cough and shortness of breath.   Cardiovascular: Negative for leg swelling.  Gastrointestinal: Negative for abdominal pain.  Genitourinary: Negative for vaginal bleeding, vaginal discharge and pelvic pain.      Allergies  Ciprofloxacin  Home Medications   Prior to Admission medications   Medication Sig Start Date End Date Taking? Authorizing Provider  HYDROcodone-acetaminophen (NORCO/VICODIN) 5-325 MG per tablet Take 1 tablet by mouth every 6 (six) hours as needed for moderate pain. Patient not taking: Reported on 03/01/2014 12/23/13   Suzi Roots, MD  methadone (DOLOPHINE) 10 MG/ML solution Take 50 mg by mouth daily.    Historical Provider, MD  ondansetron [facility])  8 MG tablet Take 1 tablet (8 mg total) by mouth every 8 (eight) hours as needed for nausea or vomiting. 10/28/13   Reva Boresanya S Pratt, MD  oxybutynin (DITROPAN) 5 MG tablet Take 5 mg by mouth 2 (two) times daily.    Historical Provider, MD  Prenatal Vit-Fe Fumarate-FA (PRENATAL MULTIVITAMIN) TABS tablet Take 1 tablet by mouth daily at 12 noon. 09/26/13   Willodean Rosenthalarolyn Harraway-Smith, MD  promethazine (PHENERGAN) 25 MG tablet Take 1 tablet (25 mg total) by mouth every 6 (six)  hours as needed for nausea or vomiting. Patient not taking: Reported on 03/01/2014 10/03/13   Dorathy KinsmanVirginia Smith, CNM   BP 116/69 mmHg  Pulse 102  Temp(Src) 99 F (37.2 C) (Oral)  Resp 20  Ht 5\' 2"  (1.575 m)  Wt 172 lb (78.019 kg)  BMI 31.45 kg/m2  SpO2 90%  LMP 09/24/2013 Physical Exam  Constitutional: She is oriented to person, place, and time. She appears well-developed and well-nourished. No distress.  HENT:  Head: Normocephalic and atraumatic.  Neck: Neck supple.  Cardiovascular: Regular rhythm.  Tachycardia present.   No lower extremity edema  Pulmonary/Chest: Tachypnea noted. She has wheezes. She has no rales.  Patient is able to speak in complete sentences.   Abdominal: Soft. There is no tenderness. There is no rebound and no guarding.  Gravid uterus  Musculoskeletal: Normal range of motion.  Neurological: She is alert and oriented to person, place, and time.  Skin: Skin is warm and dry. She is not diaphoretic.  Nursing note and vitals reviewed.   ED Course  Procedures (including critical care time) Labs Review Labs Reviewed  CBC - Abnormal; Notable for the following:    WBC 10.6 (*)    MCH 34.1 (*)    Platelets 134 (*)    All other components within normal limits  BASIC METABOLIC PANEL - Abnormal; Notable for the following:    BUN <5 (*)    Creatinine, Ser 0.48 (*)    All other components within normal limits  BLOOD GAS, ARTERIAL - Abnormal; Notable for the following:    pCO2 arterial 30.6 (*)    pO2, Arterial 76.0 (*)    Bicarbonate 19.5 (*)    Acid-base deficit 3.3 (*)    All other components within normal limits  Rosezena SensorI-STAT TROPOININ, ED    Imaging Review Dg Chest 2 View  03/06/2014   CLINICAL DATA:  Short of breath.  Thirty weeks pregnant.  EXAM: CHEST  2 VIEW  COMPARISON:  None.  FINDINGS: Central left upper lobe airspace disease. Right lung is clear. Normal heart size. No pneumothorax or pleural effusion.  IMPRESSION: Left upper lobe airspace disease.    Electronically Signed   By: Maryclare BeanArt  Hoss M.D.   On: 03/06/2014 11:30     EKG Interpretation None      MDM   Final diagnoses:  HCAP (healthcare-associated pneumonia)  Hypoxia  High-risk pregnancy, third trimester   Patient currently [redacted] weeks pregnant presents with hypoxia, dyspnea. Wheezing on exam plan to obtain x-rays, breathing treatment will reevaluate.  X-ray show left upper pneumonia. Patient is persistently tachypenic and hypoxic on room air. Discussed unable to dose methadone due to legal restraints.  11:38 AM discussed patient history with Dr. Emelda FearFerguson, OB, awaiting breathing treatment and reevaluation to assign bed. [facility] of 33 week pregnancy and treatment of pneumonia. Per rapid response nurse, normal monitoring. Discussed patient history, condition with pharmacist, in order to cover for HCAP advises  1 g cefepime. Discussed currently therapy regimen with Dr. Emelda Fear who agrees to accept the patient at [facility] hospital.  Meds given in ED:  Medications  methadone (DOLOPHINE) tablet 50 mg (50 mg Oral Given 03/06/14 1401)  oxybutynin (DITROPAN) tablet 5 mg (5 mg Oral Given 03/06/14 1352)  acetaminophen (TYLENOL) tablet 650 mg (650 mg Oral Given 03/06/14 1519)  zolpidem (AMBIEN) tablet 5 mg (not administered)  docusate sodium (COLACE) capsule 100 mg (100 mg Oral Given 03/06/14 1519)  calcium carbonate (TUMS - dosed in mg elemental calcium) chewable tablet 400 mg of elemental calcium (not administered)  prenatal multivitamin tablet 1 tablet (1 tablet Oral Given 03/06/14 1519)  famotidine (PEPCID) tablet 40 mg (not administered)  azithromycin (ZITHROMAX) 500 mg in dextrose 5 % 250 mL IVPB (500 mg Intravenous Given 03/06/14 1514)  cefTRIAXone (ROCEPHIN) 1 g in dextrose 5 % 50 mL IVPB - Premix (not administered)  albuterol (PROVENTIL) (2.5 MG/3ML) 0.083% nebulizer solution 2.5 mg (2.5 mg Nebulization Given 03/06/14 1603)   albuterol (PROVENTIL) (2.5 MG/3ML) 0.083% nebulizer solution 2.5 mg (not administered)  aspirin EC tablet 81 mg (81 mg Oral Given 03/06/14 1401)  ondansetron (ZOFRAN) 8 mg/NS 50 ml IVPB (8 mg Intravenous Given 03/06/14 1540)  albuterol (PROVENTIL) (2.5 MG/3ML) 0.083% nebulizer solution 2.5 mg (2.5 mg Nebulization Given 03/06/14 1131)    Current Discharge Medication List       Mellody Drown, PA-C 03/06/14 1656  Warnell Forester, MD 03/06/14 Rickey Primus

## 2014-03-06 NOTE — ED Notes (Signed)
Notified OB Rapid Response 

## 2014-03-06 NOTE — ED Notes (Signed)
[facility] at bedside 

## 2014-03-06 NOTE — ED Notes (Signed)
Pt to ED c/o sudden shortness of breath since this morning. Pt tachynpeic. Pt referred to ED from methadone clinic for sob. Reports clinic would not dose methadone today until pt was cleared. Pt's due date 2/29. Fetal movement at bedside

## 2014-03-06 NOTE — Progress Notes (Signed)
ANTIBIOTIC CONSULT NOTE - INITIAL  Pharmacy Consult for Cefepime Indication: pneumonia  Allergies  Allergen Reactions  . Ciprofloxacin Diarrhea and Nausea And Vomiting    Patient Measurements: Height: 5\' 2"  (157.5 cm) Weight: 172 lb (78.019 kg) IBW/kg (Calculated) : 50.1 Adjusted Body Weight:   Vital Signs: Temp: 99 F (37.2 C) (12/27 0936) Temp Source: Oral (12/27 0936) BP: 125/77 mmHg (12/27 1200) Pulse Rate: 83 (12/27 1200) Intake/Output from previous day:   Intake/Output from this shift:    Labs:  Recent Labs  03/06/14 0950  WBC 10.6*  HGB 14.6  PLT 134*  CREATININE 0.48*   Estimated Creatinine Clearance: 104.9 mL/min (by C-G formula based on Cr of 0.48). No results for input(s): VANCOTROUGH, VANCOPEAK, VANCORANDOM, GENTTROUGH, GENTPEAK, GENTRANDOM, TOBRATROUGH, TOBRAPEAK, TOBRARND, AMIKACINPEAK, AMIKACINTROU, AMIKACIN in the last 72 hours.   Microbiology: No results found for this or any previous visit (from the past 720 hour(s)).  Medical History: Past Medical History  Diagnosis Date  . Interstitial cystitis   . Chronic pelvic pain in female   . Headache(784.0)   . Infection     UTI  . Pyelonephritis affecting pregnancy in first trimester July 2015  . Depression 2007    Medications:  Scheduled:   Assessment: 23yo 30-6/[redacted] weeks gestation female with pneumonia, to start Cefepime, [facility].  Cr < 1, WBC 10.6.  No obstetric issues at this time.    Cefepime does cross the placenta; it is Pregnancy category B.  Goal of Therapy:  resolution of pneumonia.  Plan:  -Cefepime 2g IV q24   Marisue Humble, PharmD Clinical Pharmacist [facility]

## 2014-03-06 NOTE — Progress Notes (Signed)
Report given to [facility]. Pt to go to room 156.

## 2014-03-06 NOTE — ED Notes (Signed)
Pt's oxygen sats at 90-91% on room air post treatment; Lauren PA aware

## 2014-03-06 NOTE — Progress Notes (Signed)
Dr. Wofford in to see pt. 

## 2014-03-06 NOTE — Progress Notes (Signed)
Parker PA in to see pt. Says she is going to call Dr. Sherryl BartersFergusen and [facility]

## 2014-03-06 NOTE — Progress Notes (Signed)
Spoke with Dr. Sherryl Barters. FHR tracing is reactive, category 1, no uc's, vaginal bleeding. Pt's chest x-ray shows pneumonia. Wants pt to be admitted to Antenatal unit.

## 2014-03-06 NOTE — Progress Notes (Signed)
Spoke with Jimmey Ralph PA. Pt's FHR tracing is reactive. Pt is obstetrically stable. Chest x-ray shows pneumonia. Says she will call Dr. Sherryl Barters , [facility] attending on call.

## 2014-03-07 LAB — CBC
HCT: 31.8 % — ABNORMAL LOW (ref 36.0–46.0)
Hemoglobin: 11.3 g/dL — ABNORMAL LOW (ref 12.0–15.0)
MCH: 34.5 pg — ABNORMAL HIGH (ref 26.0–34.0)
MCHC: 35.5 g/dL (ref 30.0–36.0)
MCV: 97 fL (ref 78.0–100.0)
Platelets: 152 10*3/uL (ref 150–400)
RBC: 3.28 MIL/uL — ABNORMAL LOW (ref 3.87–5.11)
RDW: 12.3 % (ref 11.5–15.5)
WBC: 6.5 10*3/uL (ref 4.0–10.5)

## 2014-03-07 LAB — METHADONE (GC/LC/MS), URINE
EDDP (GC/LC/MS), ur confirm: 2848 ng/mL — AB (ref <100)
Methadone (GC/LC/MS), ur confirm: 1985 ng/mL — AB (ref <100)

## 2014-03-07 LAB — CANNABANOIDS (GC/LC/MS), URINE: THC-COOH (GC/LC/MS), ur confirm: 42 ng/mL — AB (ref <5)

## 2014-03-07 MED ORDER — ONDANSETRON 4 MG PO TBDP
8.0000 mg | ORAL_TABLET | Freq: Three times a day (TID) | ORAL | Status: DC | PRN
  Administered 2014-03-07 – 2014-03-09 (×3): 8 mg via ORAL
  Filled 2014-03-07 (×4): qty 2

## 2014-03-07 NOTE — Progress Notes (Signed)
Ur chart review completed.  

## 2014-03-07 NOTE — Progress Notes (Signed)
Patient ID: Victoria Alvarez, female   DOB: 06-21-1990, 23 y.o.   MRN: [id number] [place]) NOTE  Victoria Alvarez is a 23 y.o. G3P1011 at [redacted]w[redacted]d who is admitted for pneumonia. [facility]-91 [facility].  Was treated with bronchodilator , albuterol with some improvement. Now with SaO2 in 95 range on 2L O2. Being Tx'd now as Community Acquired Pneumonia, was originally treated also with Cefipime due to hospitalization a month ago for methadone initiation. Her child had" Pneumonia" in last 2 weeks. Fetal presentation is unsure. Length of Stay:  1  Days  Subjective: Pt feels better, is having productive sputum in response to mucinex Patient reports the fetal movement as active. Patient reports uterine contraction  activity as none. Patient reports  vaginal bleeding as none. Patient describes fluid per vagina as None.  Vitals:  Blood pressure 102/49, pulse 67, temperature 98 F (36.7 C), temperature source Oral, resp. rate 24, height 5' 2"  (1.575 m), weight 78.019 kg (172 lb), last menstrual period 09/24/2013, SpO2 95 %. Physical Examination:  General appearance - alert, well appearing, and in no distress and oriented to person, place, and time Heart - normal rate and regular rhythm Lungs : Bilateral sonorous ronchi Rt>>Left, occasional upper lobe wheeze LUL Abdomen - soft, nontender, nondistended Fundal Height:  size equals dates Cervical Exam: Not evaluated. and f fetal presentation is unsure. Extremities: extremities normal, atraumatic, no cyanosis or edema and Homans sign is negative, no sign of DVT with DTRs 2+ bilaterally Membranes:intact  Fetal Monitoring:  Daily normal  Labs:  Results for orders placed or performed during the hospital encounter of 03/06/14 (from the past 24 hour(s))  CBC   Collection Time: 03/06/14  9:50 AM  Result Value Ref Range   WBC 10.6 (H) 4.0 - 10.5 K/uL   RBC 4.28 3.87 - 5.11  MIL/uL   Hemoglobin 14.6 12.0 - 15.0 g/dL   HCT 41.3 36.0 - 46.0 %   MCV 96.5 78.0 - 100.0 fL   MCH 34.1 (H) 26.0 - 34.0 pg   MCHC 35.4 30.0 - 36.0 g/dL   RDW 12.0 11.5 - 15.5 %   Platelets 134 (L) 150 - 400 K/uL  Basic metabolic panel   Collection Time: 03/06/14  9:50 AM  Result Value Ref Range   Sodium 135 135 - 145 mmol/L   Potassium 3.6 3.5 - 5.1 mmol/L   Chloride 105 96 - 112 mEq/L   CO2 21 19 - 32 mmol/L   Glucose, Bld 96 70 - 99 mg/dL   BUN <5 (L) 6 - 23 mg/dL   Creatinine, Ser 0.48 (L) 0.50 - 1.10 mg/dL   Calcium 9.0 8.4 - 10.5 mg/dL   GFR calc non Af Amer >90 >90 mL/min   GFR calc Af Amer >90 >90 mL/min   Anion gap 9 5 - 15  [facility] troponin[facility]   Collection Time: 03/06/14 10:15 AM  Result Value Ref Range   Troponin i, poc 0.00 0.00 - 0.08 ng/mL   Comment 3          Blood gas, arterial   Collection Time: 03/06/14  2:38 PM  Result Value Ref Range   FIO2 1.00 %   O2 Content 2.0 L/min   Delivery systems NASAL CANNULA    pH, Arterial 7.422 7.350 - 7.450   pCO2 arterial 30.6 (L) 35.0 - 45.0 mmHg   pO2, Arterial 76.0 (L) 80.0 -  100.0 mmHg   Bicarbonate 19.5 (L) 20.0 - 24.0 mEq/L   TCO2 20.5 0 - 100 mmol/L   Acid-base deficit 3.3 (H) 0.0 - 2.0 mmol/L   O2 Saturation 96.0 %   Collection site RADIAL    Drawn by [phone]    Sample type ARTERIAL    Allens test (pass/fail) PASS PASS  CBC   Collection Time: 03/07/14  5:25 AM  Result Value Ref Range   WBC 6.5 4.0 - 10.5 K/uL   RBC 3.28 (L) 3.87 - 5.11 MIL/uL   Hemoglobin 11.3 (L) 12.0 - 15.0 g/dL   HCT 31.8 (L) 36.0 - 46.0 %   MCV 97.0 78.0 - 100.0 fL   MCH 34.5 (H) 26.0 - 34.0 pg   MCHC 35.5 30.0 - 36.0 g/dL   RDW 12.3 11.5 - 15.5 %   Platelets 152 150 - 400 K/uL    Imaging Studies:     Currently EPIC will not allow sonographic studies to automatically populate into notes.  In the meantime, copy and paste results into note or free text.  Medications:  Scheduled . albuterol  2.5 mg Nebulization Q4H   . aspirin EC  81 mg Oral Daily  . azithromycin  500 mg Intravenous Q24H  . cefTRIAXone (ROCEPHIN)  IV  1 g Intravenous Q12H  . docusate sodium  100 mg Oral Daily  . famotidine  40 mg Oral QHS  . guaiFENesin  600 mg Oral BID  . methadone  50 mg Oral Daily  . oxybutynin  5 mg Oral BID  . prenatal multivitamin  1 tablet Oral Q1200   I have reviewed the patient's current medications.  ASSESSMENT: Patient Active Problem List   Diagnosis Date Noted  . Pneumonia complicating pregnancy 42/68/3419  . Supervision of high risk pregnancy in third trimester 03/01/2014  . Substance abuse affecting pregnancy in third trimester, antepartum 03/01/2014  . Marijuana smoker 03/01/2014  . Current smoker 03/01/2014  . Hx of preeclampsia, prior pregnancy, currently pregnant 03/01/2014  . Pyelonephritis 10/27/2013  . Pyelonephritis affecting pregnancy in first trimester, antepartum 09/25/2013    PLAN: Keep as inpt til lungs clearer, probably 1-2 more days  Continue Rocephin/Azithromycin  Vernica Wachtel V 03/07/2014,7:40 AM

## 2014-03-07 NOTE — Progress Notes (Signed)
Incentive spirometer given and pt able to reach 1100 ml on second attempt evoking productive cough.  Unable to access sputum character at this time.

## 2014-03-08 MED ORDER — OXYMETAZOLINE HCL 0.05 % NA SOLN
1.0000 | Freq: Two times a day (BID) | NASAL | Status: DC
  Administered 2014-03-08 – 2014-03-09 (×2): 1 via NASAL
  Filled 2014-03-08: qty 15

## 2014-03-08 MED ORDER — POLYETHYLENE GLYCOL 3350 17 G PO PACK
17.0000 g | PACK | Freq: Every day | ORAL | Status: DC | PRN
  Administered 2014-03-08: 17 g via ORAL
  Filled 2014-03-08: qty 1

## 2014-03-08 NOTE — Progress Notes (Signed)
Pt with flutter valve from RT.  On assessing lungs breath sounds more diminished in left lower lobe.  Pt encourage to use valve.  Coughing evoked, but unable to bring up any sputum.  Pt c/o not being able to breath pulse ox WNL.  Pt c/o pain at rib cage from coughing.  Tylenol given.

## 2014-03-08 NOTE — Progress Notes (Signed)
Patient ID: Victoria Alvarez, female   DOB: March 07, 1991, 23 y.o.   MRN: [id number] [place] COMPREHENSIVE PROGRESS NOTE  Victoria Alvarez is a 23 y.o. G3P1011 at [redacted]w[redacted]d who is admitted for SOB.  Now with LUL pneumonia   Fetal presentation is cephalic. Length of Stay:  2  Days  Subjective: Pt c/o nose bleed due to the oxygen.  She reports that she feels awful and is incontinent of urine with coughing. She wants to go out of her room to the cafeteria. Patient reports good fetal movement.  She reports no uterine contractions, no bleeding and no loss of fluid per vagina.  Vitals:  Blood pressure 105/52, pulse 81, temperature 98.6 F (37 C), temperature source Oral, resp. rate 24, height 5' 2"  (1.575 m), weight 176 lb 1 oz (79.861 kg), last menstrual period 09/24/2013, SpO2 95 %. Physical Examination: General appearance - alert, well appearing, and in no distress and ill-appearing Chest - coarse BS noted throughout.  She is coughing with deep breathing but, she took off her O2 and she is currently maintaining her sats in the 97% range on RA   Heart - normal rate, regular rhythm, normal S1, S2, no murmurs, rubs, clicks or gallops Abdomen - soft, nontender, nondistended, no masses or organomegaly gravid Extremities: extremities normal, atraumatic, no cyanosis or edema  Membranes:intact  Fetal Monitoring:  Baseline: 140's to 150's bpm, Variability: Good {> 6 bpm) and Accelerations: Reactive  Labs:  CBC Latest Ref Rng 03/07/2014 03/06/2014 12/23/2013  WBC 4.0 - 10.5 K/uL 6.5 10.6(H) 7.1  Hemoglobin 12.0 - 15.0 g/dL 11.3(L) 14.6 13.5  Hematocrit 36.0 - 46.0 % 31.8(L) 41.3 37.7  Platelets 150 - 400 K/uL 152 134(L) 199     Imaging Studies:    [facility]  Sacral dimple noted with normal genetic testing H/o preeclampsia rec sono for growth q 4 weeks    Medications:  Scheduled . aspirin EC  81 mg Oral Daily  . azithromycin  500 mg Intravenous Q24H  .  cefTRIAXone (ROCEPHIN)  IV  1 g Intravenous Q12H  . docusate sodium  100 mg Oral Daily  . famotidine  40 mg Oral QHS  . guaiFENesin  600 mg Oral BID  . methadone  50 mg Oral Daily  . oxybutynin  5 mg Oral BID  . prenatal multivitamin  1 tablet Oral Q1200   I have reviewed the patient's current medications.  ASSESSMENT: Patient Active Problem List   Diagnosis Date Noted  . Pneumonia complicating pregnancy 150/38/8828 . Supervision of high risk pregnancy in third trimester 03/01/2014  . Substance abuse affecting pregnancy in third trimester, antepartum 03/01/2014  . Marijuana smoker 03/01/2014  . Current smoker 03/01/2014  . Hx of preeclampsia, prior pregnancy, currently pregnant 03/01/2014  . Pyelonephritis 10/27/2013  . Pyelonephritis affecting pregnancy in first trimester, antepartum 09/25/2013    PLAN: Keep on current atbx Consider discharge in am if pt able to tolerate room air   Continue routine antenatal care.   HARRAWAY-SMITH, Mister Krahenbuhl 03/08/2014,7:22 AM

## 2014-03-09 DIAGNOSIS — J189 Pneumonia, unspecified organism: Secondary | ICD-10-CM

## 2014-03-09 DIAGNOSIS — O99513 Diseases of the respiratory system complicating pregnancy, third trimester: Principal | ICD-10-CM

## 2014-03-09 LAB — PRESCRIPTION MONITORING PROFILE (19 PANEL)
Amphetamine/Meth: NEGATIVE ng/mL
Barbiturate Screen, Urine: NEGATIVE ng/mL
Benzodiazepine Screen, Urine: NEGATIVE ng/mL
Buprenorphine, Urine: NEGATIVE ng/mL
Carisoprodol, Urine: NEGATIVE ng/mL
Cocaine Metabolites: NEGATIVE ng/mL
Creatinine, Urine: 50.78 mg/dL (ref >20.0)
Fentanyl, Ur: NEGATIVE ng/mL
MDMA URINE: NEGATIVE ng/mL
Meperidine, Ur: NEGATIVE ng/mL
Methaqualone: NEGATIVE ng/mL
Nitrites, Initial: NEGATIVE ug/mL
Opiate Screen, Urine: NEGATIVE ng/mL
Oxycodone Screen, Ur: NEGATIVE ng/mL
Phencyclidine, Ur: NEGATIVE ng/mL
Propoxyphene: NEGATIVE ng/mL
Tapentadol, urine: NEGATIVE ng/mL
Tramadol Scrn, Ur: NEGATIVE ng/mL
Zolpidem, Urine: NEGATIVE ng/mL
pH, Initial: 6.7 pH (ref 4.5–8.9)

## 2014-03-09 MED ORDER — AZITHROMYCIN 250 MG PO TABS: 250.0000 mg | ORAL_TABLET | Freq: Once | ORAL | Status: DC

## 2014-03-09 MED ORDER — ALBUTEROL SULFATE HFA 108 (90 BASE) MCG/ACT IN AERS: 2.0000 | INHALATION_SPRAY | Freq: Four times a day (QID) | RESPIRATORY_TRACT | Status: DC | PRN

## 2014-03-09 NOTE — Discharge Summary (Signed)
Physician Discharge Summary  Patient ID: Victoria BoresKatrina Fierro MRN: [id number] DOB/AGE: 24/11/1990 24 y.o.  Admit date: 03/06/2014 Discharge date: 03/09/2014  Admission Diagnoses:  Discharge Diagnoses:  Active Problems:   Pneumonia complicating pregnancy   Discharged Condition: good  Hospital Course: Admitted for CAP, treated with cefepime initially for HCAP however she did not meet criteria and was transitioned to rocephin/azithromycin.  Weaned off O2, desaturated while ambulatory initially; now ambulates without difficult, feeling much better and completely off O2.  Received 4d of IV antibiotics, only needs 1 more day for total of 5 days of therapy.  Consults: None  Significant Diagnostic Studies: CXR  Treatments: IV hydration and antibiotics: rocephin and azythromcin  Discharge Exam: Blood pressure 117/74, pulse 70, temperature 98.4 F (36.9 C), temperature source Oral, resp. rate 18, height 5\' 2"  (1.575 m), weight 180 lb 8 oz (81.874 kg), last menstrual period 09/24/2013, SpO2 97 %. General appearance: alert, cooperative and no distress Resp: no respiratory distress Extremities: extremities normal, atraumatic, no cyanosis or edema Skin: Skin color, texture, turgor normal. No rashes or lesions  Disposition: 01-Home or Self Care  Discharge Instructions    Diet - low sodium heart healthy    Complete by:  As directed      Increase activity slowly    Complete by:  As directed             Medication List    TAKE these medications        albuterol 108 (90 BASE) MCG/ACT inhaler  Commonly known as:  PROVENTIL HFA;VENTOLIN HFA  Inhale 2 puffs into the lungs every 6 (six) hours as needed for wheezing or shortness of breath.     aspirin 81 MG tablet  Take 81 mg by mouth daily.     azithromycin 250 MG tablet  Commonly known as:  ZITHROMAX  Take 1 tablet (250 mg total) by mouth once.     HYDROcodone-acetaminophen 5-325 MG per tablet  Commonly known as:  NORCO/VICODIN  Take  1 tablet by mouth every 6 (six) hours as needed for moderate pain.     methadone 10 MG/ML solution  Commonly known as:  DOLOPHINE  Take 50 mg by mouth daily.     ondansetron 8 MG tablet  Commonly known as:  ZOFRAN  Take 1 tablet (8 mg total) by mouth every 8 (eight) hours as needed for nausea or vomiting.     oxybutynin 5 MG tablet  Commonly known as:  DITROPAN  Take 5 mg by mouth 2 (two) times daily.     prenatal multivitamin Tabs tablet  Take 1 tablet by mouth daily at 12 noon.           Follow-up Information    Follow up with [facility] In 5 weeks.      Signed: Hannelore Bova ROCIO 03/09/2014, 6:11 PM

## 2014-03-09 NOTE — Progress Notes (Signed)
Patient ID: Victoria Alvarez, female   DOB: 03-08-1991, 23 y.o.   MRN: [id number] [place]) NOTE  Victoria Alvarez is a 23 y.o. G3P1011 at 68w2dby best clinical estimate who is admitted for pneumonia.   Fetal presentation is unsure. Length of Stay:  3  Days  Subjective: Still with cough.  Has rib pain from coughing. Not bringing anything up. Slept better last night. Patient reports the fetal movement as active. Patient reports uterine contraction  activity as none. Patient reports  vaginal bleeding as none. Patient describes fluid per vagina as None.  Vitals:  Blood pressure 125/61, pulse 92, temperature 97.9 F (36.6 C), temperature source Oral, resp. rate 24, height 5' 2"  (1.575 m), weight 177 lb 11.2 oz (80.604 kg), last menstrual period 09/24/2013, SpO2 94 %. Physical Examination:  General appearance - alert, well appearing, and in no distress Abdomen - gravid, NT Fundal Height:  size equals dates Extremities: extremities normal, atraumatic, no cyanosis or edema  Membranes:intact  Fetal Monitoring:  Baseline: 140 bpm, Variability: Good {> 6 bpm), Accelerations: Reactive and Decelerations: Absent  Medications:  Scheduled . aspirin EC  81 mg Oral Daily  . azithromycin  500 mg Intravenous Q24H  . cefTRIAXone (ROCEPHIN)  IV  1 g Intravenous Q12H  . docusate sodium  100 mg Oral Daily  . famotidine  40 mg Oral QHS  . guaiFENesin  600 mg Oral BID  . methadone  50 mg Oral Daily  . oxybutynin  5 mg Oral BID  . oxymetazoline  1 spray Each Nare BID  . prenatal multivitamin  1 tablet Oral Q1200   I have reviewed the patient's current medications.  ASSESSMENT: Patient Active Problem List   Diagnosis Date Noted  . Pneumonia complicating pregnancy 196/28/3662 . Supervision of high risk pregnancy in third trimester 03/01/2014  . Substance abuse affecting pregnancy in third trimester, antepartum 03/01/2014  . Marijuana smoker 03/01/2014  . Current smoker  03/01/2014  . Hx of preeclampsia, prior pregnancy, currently pregnant 03/01/2014  . Pyelonephritis 10/27/2013  . Pyelonephritis affecting pregnancy in first trimester, antepartum 09/25/2013    PLAN: Continue respiratory toilet Continue antibiotics Ambulate with pulse ox today--if sats remain up, consider discharge.  PDonnamae Jude MD 03/09/2014,7:28 AM

## 2014-03-09 NOTE — Progress Notes (Signed)
Pt appears to be resting comfortably.  Breathing quietly with no accessory muscle movements

## 2014-03-09 NOTE — Discharge Instructions (Signed)

## 2014-03-11 DIAGNOSIS — J189 Pneumonia, unspecified organism: Secondary | ICD-10-CM

## 2014-03-11 HISTORY — DX: Pneumonia, unspecified organism: J18.9

## 2014-03-14 ENCOUNTER — Encounter: Payer: Self-pay | Admitting: *Deleted

## 2014-03-15 ENCOUNTER — Encounter: Payer: Medicaid Other | Admitting: Advanced Practice Midwife

## 2014-03-31 ENCOUNTER — Ambulatory Visit (INDEPENDENT_AMBULATORY_CARE_PROVIDER_SITE_OTHER): Payer: Medicaid Other | Admitting: Family Medicine

## 2014-03-31 VITALS — BP 120/81 | HR 79 | Temp 98.2°F | Wt 177.4 lb

## 2014-03-31 DIAGNOSIS — F1910 Other psychoactive substance abuse, uncomplicated: Secondary | ICD-10-CM

## 2014-03-31 DIAGNOSIS — O99323 Drug use complicating pregnancy, third trimester: Secondary | ICD-10-CM

## 2014-03-31 DIAGNOSIS — O09293 Supervision of pregnancy with other poor reproductive or obstetric history, third trimester: Secondary | ICD-10-CM

## 2014-03-31 DIAGNOSIS — O0993 Supervision of high risk pregnancy, unspecified, third trimester: Secondary | ICD-10-CM

## 2014-03-31 LAB — POCT URINALYSIS DIP (DEVICE)
Bilirubin Urine: NEGATIVE
Glucose, UA: NEGATIVE mg/dL
Hgb urine dipstick: NEGATIVE
Ketones, ur: NEGATIVE mg/dL
Nitrite: NEGATIVE
Protein, ur: NEGATIVE mg/dL
Specific Gravity, Urine: 1.015 (ref 1.005–1.030)
Urobilinogen, UA: 2 mg/dL — ABNORMAL HIGH (ref 0.0–1.0)
pH: 6.5 (ref 5.0–8.0)

## 2014-03-31 MED ORDER — OXYBUTYNIN CHLORIDE 5 MG PO TABS: 5.0000 mg | ORAL_TABLET | Freq: Two times a day (BID) | ORAL | Status: DC

## 2014-03-31 NOTE — Progress Notes (Signed)
Pt reports she feel on bottom on last Friday morning; pressure in the vaginal area, sore tail bone.  Did not go to [facility] pt states that she checked for kick counts and baby was fine.

## 2014-03-31 NOTE — Patient Instructions (Signed)
Third Trimester of Pregnancy The third trimester is from week 29 through week 42, months 7 through 9. The third trimester is a time when the fetus is growing rapidly. At the end of the ninth month, the fetus is about 20 inches in length and weighs 6-10 pounds.  BODY CHANGES Your body goes through many changes during pregnancy. The changes vary from woman to woman.   Your weight will continue to increase. You can expect to gain 25-35 pounds (11-16 kg) by the end of the pregnancy.  You may begin to get stretch marks on your hips, abdomen, and breasts.  You may urinate more often because the fetus is moving lower into your pelvis and pressing on your bladder.  You may develop or continue to have heartburn as a result of your pregnancy.  You may develop constipation because certain hormones are causing the muscles that push waste through your intestines to slow down.  You may develop hemorrhoids or swollen, bulging veins (varicose veins).  You may have pelvic pain because of the weight gain and pregnancy hormones relaxing your joints between the bones in your pelvis. Backaches may result from overexertion of the muscles supporting your posture.  You may have changes in your hair. These can include thickening of your hair, rapid growth, and changes in texture. Some women also have hair loss during or after pregnancy, or hair that feels dry or thin. Your hair will most likely return to normal after your baby is born.  Your breasts will continue to grow and be tender. A yellow discharge may leak from your breasts called colostrum.  Your belly button may stick out.  You may feel short of breath because of your expanding uterus.  You may notice the fetus "dropping," or moving lower in your abdomen.  You may have a bloody mucus discharge. This usually occurs a few days to a week before labor begins.  Your cervix becomes thin and soft (effaced) near your due date. WHAT TO EXPECT AT YOUR PRENATAL  EXAMS  You will have prenatal exams every 2 weeks until week 36. Then, you will have weekly prenatal exams. During a routine prenatal visit:  You will be weighed to make sure you and the fetus are growing normally.  Your blood pressure is taken.  Your abdomen will be measured to track your baby's growth.  The fetal heartbeat will be listened to.  Any test results from the previous visit will be discussed.  You may have a cervical check near your due date to see if you have effaced. At around 36 weeks, your caregiver will check your cervix. At the same time, your caregiver will also perform a test on the secretions of the vaginal tissue. This test is to determine if a type of bacteria, Group B streptococcus, is present. Your caregiver will explain this further. Your caregiver may ask you:  What your birth plan is.  How you are feeling.  If you are feeling the baby move.  If you have had any abnormal symptoms, such as leaking fluid, bleeding, severe headaches, or abdominal cramping.  If you have any questions. Other tests or screenings that may be performed during your third trimester include:  Blood tests that check for low iron levels (anemia).  Fetal testing to check the health, activity level, and growth of the fetus. Testing is done if you have certain medical conditions or if there are problems during the pregnancy. FALSE LABOR You may feel small, irregular contractions that   eventually go away. These are called Braxton Hicks contractions, or false labor. Contractions may last for hours, days, or even weeks before true labor sets in. If contractions come at regular intervals, intensify, or become painful, it is best to be seen by your caregiver.  SIGNS OF LABOR   Menstrual-like cramps.  Contractions that are 5 minutes apart or less.  Contractions that start on the top of the uterus and spread down to the lower abdomen and back.  A sense of increased pelvic pressure or back  pain.  A watery or bloody mucus discharge that comes from the vagina. If you have any of these signs before the 37th week of pregnancy, call your caregiver right away. You need to go to the hospital to get checked immediately. HOME CARE INSTRUCTIONS   Avoid all smoking, herbs, alcohol, and unprescribed drugs. These chemicals affect the formation and growth of the baby.  Follow your caregiver's instructions regarding medicine use. There are medicines that are either safe or unsafe to take during pregnancy.  Exercise only as directed by your caregiver. Experiencing uterine cramps is a good sign to stop exercising.  Continue to eat regular, healthy meals.  Wear a good support bra for breast tenderness.  Do not use hot tubs, steam rooms, or saunas.  Wear your seat belt at all times when driving.  Avoid raw meat, uncooked cheese, cat litter boxes, and soil used by cats. These carry germs that can cause birth defects in the baby.  Take your prenatal vitamins.  Try taking a stool softener (if your caregiver approves) if you develop constipation. Eat more high-fiber foods, such as fresh vegetables or fruit and whole grains. Drink plenty of fluids to keep your urine clear or pale yellow.  Take warm sitz baths to soothe any pain or discomfort caused by hemorrhoids. Use hemorrhoid cream if your caregiver approves.  If you develop varicose veins, wear support hose. Elevate your feet for 15 minutes, 3-4 times a day. Limit salt in your diet.  Avoid heavy lifting, wear low heal shoes, and practice good posture.  Rest a lot with your legs elevated if you have leg cramps or low back pain.  Visit your dentist if you have not gone during your pregnancy. Use a soft toothbrush to brush your teeth and be gentle when you floss.  A sexual relationship may be continued unless your caregiver directs you otherwise.  Do not travel far distances unless it is absolutely necessary and only with the approval  of your caregiver.  Take prenatal classes to understand, practice, and ask questions about the labor and delivery.  Make a trial run to the hospital.  Pack your hospital bag.  Prepare the baby's nursery.  Continue to go to all your prenatal visits as directed by your caregiver. SEEK MEDICAL CARE IF:  You are unsure if you are in labor or if your water has broken.  You have dizziness.  You have mild pelvic cramps, pelvic pressure, or nagging pain in your abdominal area.  You have persistent nausea, vomiting, or diarrhea.  You have a bad smelling vaginal discharge.  You have pain with urination. SEEK IMMEDIATE MEDICAL CARE IF:   You have a fever.  You are leaking fluid from your vagina.  You have spotting or bleeding from your vagina.  You have severe abdominal cramping or pain.  You have rapid weight loss or gain.  You have shortness of breath with chest pain.  You notice sudden or extreme swelling   of your face, hands, ankles, feet, or legs.  You have not felt your baby move in over an hour.  You have severe headaches that do not go away with medicine.  You have vision changes. Document Released: 02/19/2001 Document Revised: 03/02/2013 Document Reviewed: 04/28/2012 Exit[employer] Patient Information 2015 [employer]. This information is not intended to replace advice given to you by your health care provider. Make sure you discuss any questions you have with your health care provider.  

## 2014-03-31 NOTE — Progress Notes (Signed)
Fell on tailbone yesterday, hit abdomen.  No contractions or decreased fetal movement. Discussed that if she falls and hits abdomen, she should go to [facility] for evaluation.

## 2014-04-05 ENCOUNTER — Telehealth: Payer: Self-pay

## 2014-04-05 MED ORDER — ONDANSETRON HCL 8 MG PO TABS: 8.0000 mg | ORAL_TABLET | Freq: Three times a day (TID) | ORAL | Status: DC | PRN

## 2014-04-05 NOTE — Telephone Encounter (Signed)
Patient called requesting refill of Zofran be sent to [facility] on [address] In [place].   Consulted Nada Maclachlan, PA who OK'd refill of Zofran. Medication e-prescribed to patient's requested pharmacy.

## 2014-04-06 ENCOUNTER — Telehealth: Payer: Self-pay | Admitting: General Practice

## 2014-04-06 DIAGNOSIS — O219 Vomiting of pregnancy, unspecified: Secondary | ICD-10-CM

## 2014-04-06 MED ORDER — ONDANSETRON 8 MG PO TBDP: 8.0000 mg | ORAL_TABLET | Freq: Three times a day (TID) | ORAL | Status: DC | PRN

## 2014-04-06 NOTE — Telephone Encounter (Signed)
Patient called and left message stating please call [facility]fr[facility] in to [facility] in [place], it's not the dissolving tablets and she would rather have that.

## 2014-04-06 NOTE — Telephone Encounter (Signed)
Dr Marice Potter authorized change to dissolving tablets. Called patient, no answer- left message stating we are calling to return her call to let her know that we got her medication switched over for her and to call us back if she has any other questions or concerns

## 2014-04-12 ENCOUNTER — Encounter: Payer: Medicaid Other | Admitting: Family Medicine

## 2014-04-19 ENCOUNTER — Encounter: Payer: Self-pay | Admitting: Obstetrics and Gynecology

## 2014-04-19 ENCOUNTER — Ambulatory Visit (INDEPENDENT_AMBULATORY_CARE_PROVIDER_SITE_OTHER): Payer: Medicaid Other | Admitting: Obstetrics and Gynecology

## 2014-04-19 ENCOUNTER — Other Ambulatory Visit: Payer: Self-pay | Admitting: Obstetrics and Gynecology

## 2014-04-19 VITALS — BP 116/71 | HR 94 | Temp 97.9°F | Wt 180.5 lb

## 2014-04-19 DIAGNOSIS — N12 Tubulo-interstitial nephritis, not specified as acute or chronic: Secondary | ICD-10-CM

## 2014-04-19 DIAGNOSIS — O99323 Drug use complicating pregnancy, third trimester: Secondary | ICD-10-CM

## 2014-04-19 DIAGNOSIS — O0993 Supervision of high risk pregnancy, unspecified, third trimester: Secondary | ICD-10-CM

## 2014-04-19 DIAGNOSIS — O219 Vomiting of pregnancy, unspecified: Secondary | ICD-10-CM

## 2014-04-19 DIAGNOSIS — F1910 Other psychoactive substance abuse, uncomplicated: Secondary | ICD-10-CM

## 2014-04-19 DIAGNOSIS — Z118 Encounter for screening for other infectious and parasitic diseases: Secondary | ICD-10-CM

## 2014-04-19 DIAGNOSIS — O09293 Supervision of pregnancy with other poor reproductive or obstetric history, third trimester: Secondary | ICD-10-CM

## 2014-04-19 DIAGNOSIS — Z113 Encounter for screening for infections with a predominantly sexual mode of transmission: Secondary | ICD-10-CM

## 2014-04-19 DIAGNOSIS — O2301 Infections of kidney in pregnancy, first trimester: Secondary | ICD-10-CM

## 2014-04-19 LAB — POCT URINALYSIS DIP (DEVICE)
Glucose, UA: NEGATIVE mg/dL
Hgb urine dipstick: NEGATIVE
Ketones, ur: NEGATIVE mg/dL
Leukocytes, UA: NEGATIVE
Nitrite: NEGATIVE
Protein, ur: NEGATIVE mg/dL
Specific Gravity, Urine: 1.025 (ref 1.005–1.030)
Urobilinogen, UA: 1 mg/dL (ref 0.0–1.0)
pH: 6 (ref 5.0–8.0)

## 2014-04-19 MED ORDER — ONDANSETRON 8 MG PO TBDP
8.0000 mg | ORAL_TABLET | Freq: Three times a day (TID) | ORAL | Status: DC | PRN
Start: 2014-04-19 — End: 2014-05-05

## 2014-04-19 NOTE — Progress Notes (Signed)
Patient is doing well without complaints. FM/labor precautions reviewed. Cultures collected today. Refill on Zofran provided

## 2014-04-20 LAB — GC/CHLAMYDIA PROBE AMP
CT Probe RNA: NEGATIVE
GC Probe RNA: NEGATIVE

## 2014-04-21 LAB — CULTURE, BETA STREP (GROUP B ONLY)

## 2014-04-26 ENCOUNTER — Encounter: Payer: Medicaid Other | Admitting: Obstetrics and Gynecology

## 2014-05-02 ENCOUNTER — Ambulatory Visit (HOSPITAL_COMMUNITY)
Admission: RE | Admit: 2014-05-02 | Discharge: 2014-05-02 | Disposition: A | Discharge status: Home / Self Care | Payer: Medicaid Other | Source: Ambulatory Visit | Attending: Obstetrics and Gynecology | Admitting: Obstetrics and Gynecology

## 2014-05-02 ENCOUNTER — Ambulatory Visit (INDEPENDENT_AMBULATORY_CARE_PROVIDER_SITE_OTHER): Payer: Medicaid Other | Admitting: Obstetrics and Gynecology

## 2014-05-02 ENCOUNTER — Encounter: Payer: Self-pay | Admitting: Obstetrics and Gynecology

## 2014-05-02 ENCOUNTER — Telehealth: Payer: Self-pay | Admitting: *Deleted

## 2014-05-02 VITALS — BP 123/62 | HR 77 | Temp 98.0°F | Wt 187.7 lb

## 2014-05-02 DIAGNOSIS — O09293 Supervision of pregnancy with other poor reproductive or obstetric history, third trimester: Secondary | ICD-10-CM

## 2014-05-02 DIAGNOSIS — O99323 Drug use complicating pregnancy, third trimester: Secondary | ICD-10-CM

## 2014-05-02 DIAGNOSIS — N12 Tubulo-interstitial nephritis, not specified as acute or chronic: Secondary | ICD-10-CM

## 2014-05-02 DIAGNOSIS — O99333 Smoking (tobacco) complicating pregnancy, third trimester: Secondary | ICD-10-CM | POA: Diagnosis not present

## 2014-05-02 DIAGNOSIS — O368190 Decreased fetal movements, unspecified trimester, not applicable or unspecified: Secondary | ICD-10-CM | POA: Insufficient documentation

## 2014-05-02 DIAGNOSIS — F1910 Other psychoactive substance abuse, uncomplicated: Secondary | ICD-10-CM

## 2014-05-02 DIAGNOSIS — F1990 Other psychoactive substance use, unspecified, uncomplicated: Secondary | ICD-10-CM | POA: Insufficient documentation

## 2014-05-02 DIAGNOSIS — Z3A39 39 weeks gestation of pregnancy: Secondary | ICD-10-CM | POA: Diagnosis not present

## 2014-05-02 DIAGNOSIS — O0993 Supervision of high risk pregnancy, unspecified, third trimester: Secondary | ICD-10-CM

## 2014-05-02 DIAGNOSIS — O368130 Decreased fetal movements, third trimester, not applicable or unspecified: Secondary | ICD-10-CM | POA: Diagnosis present

## 2014-05-02 DIAGNOSIS — O2301 Infections of kidney in pregnancy, first trimester: Secondary | ICD-10-CM

## 2014-05-02 DIAGNOSIS — F17210 Nicotine dependence, cigarettes, uncomplicated: Secondary | ICD-10-CM | POA: Insufficient documentation

## 2014-05-02 DIAGNOSIS — F1220 Cannabis dependence, uncomplicated: Secondary | ICD-10-CM

## 2014-05-02 DIAGNOSIS — F1290 Cannabis use, unspecified, uncomplicated: Secondary | ICD-10-CM

## 2014-05-02 LAB — POCT URINALYSIS DIP (DEVICE)
Bilirubin Urine: NEGATIVE
Glucose, UA: NEGATIVE mg/dL
Ketones, ur: NEGATIVE mg/dL
Nitrite: NEGATIVE
Protein, ur: NEGATIVE mg/dL
Specific Gravity, Urine: <=1.005 (ref 1.005–1.030)
Urobilinogen, UA: 0.2 mg/dL (ref 0.0–1.0)
pH: 6 (ref 5.0–8.0)

## 2014-05-02 NOTE — Telephone Encounter (Signed)
Dalexa called front desk and call transferred to nurse. She reports she had an ultrasound today and they told her baby was breech and they told her to call us to find out plan.  I informed her results are not available at this time, but will have provider review when available  and we will call her back. I informed her it would probably be tomorrow before we call her back.   Called Dr. Harolyn Rutherford to discuss patient , 39 weeks, breech and plan is to have patient come in Thursday 05/04/14 to discuss with provider plan of care. Move ultrasound appointment if possible. Called ultrasound and unable to move appointment . Called patient and explained we want her to come in Thursday am for appointment to discuss plan of care now that patient is breech. I also explained I was unable to move [place] appt so she will need to come back in afternoon for [place]. She voiced understanding.

## 2014-05-02 NOTE — Progress Notes (Signed)
Requests refill of Zofran.  Reports decreased fetal movement and states "its not the same and its enough to concern me." Patient reports feeling baby move here in check in.  C/o burning with urination. Pelvic pain.  Edema in feet.

## 2014-05-02 NOTE — Progress Notes (Signed)
BPP today, pt escorted to Radiology.  Anatomy U/S 04/24/14 @ 3p with Radiology.

## 2014-05-02 NOTE — Progress Notes (Signed)
Patient is doing well complaining of decreased fetal movement. Will send patient upstairs for BPP. No anatomy ultrasound on record will obtain one today along with BPP and will try to obtain ultrasound reports from previous place of prenatal care. Discussed IOL at 41 weeks if no spontaneous onset of labor FM/labor precautions reviewed

## 2014-05-04 LAB — CULTURE, OB URINE: Colony Count: >=100000

## 2014-05-05 ENCOUNTER — Encounter: Payer: Self-pay | Admitting: Obstetrics & Gynecology

## 2014-05-05 ENCOUNTER — Ambulatory Visit (HOSPITAL_COMMUNITY)
Admission: RE | Admit: 2014-05-05 | Discharge: 2014-05-05 | Disposition: A | Discharge status: Home / Self Care | Payer: Medicaid Other | Source: Ambulatory Visit | Attending: Obstetrics and Gynecology | Admitting: Obstetrics and Gynecology

## 2014-05-05 ENCOUNTER — Ambulatory Visit (INDEPENDENT_AMBULATORY_CARE_PROVIDER_SITE_OTHER): Payer: Medicaid Other | Admitting: Obstetrics & Gynecology

## 2014-05-05 VITALS — BP 127/75 | HR 77 | Temp 98.2°F | Wt 188.9 lb

## 2014-05-05 DIAGNOSIS — O99323 Drug use complicating pregnancy, third trimester: Secondary | ICD-10-CM

## 2014-05-05 DIAGNOSIS — O219 Vomiting of pregnancy, unspecified: Secondary | ICD-10-CM

## 2014-05-05 DIAGNOSIS — O0993 Supervision of high risk pregnancy, unspecified, third trimester: Secondary | ICD-10-CM

## 2014-05-05 DIAGNOSIS — F1120 Opioid dependence, uncomplicated: Secondary | ICD-10-CM | POA: Insufficient documentation

## 2014-05-05 LAB — POCT URINALYSIS DIP (DEVICE)
Bilirubin Urine: NEGATIVE
Glucose, UA: NEGATIVE mg/dL
Hgb urine dipstick: NEGATIVE
Ketones, ur: NEGATIVE mg/dL
Nitrite: NEGATIVE
Protein, ur: NEGATIVE mg/dL
Specific Gravity, Urine: 1.015 (ref 1.005–1.030)
Urobilinogen, UA: 0.2 mg/dL (ref 0.0–1.0)
pH: 7 (ref 5.0–8.0)

## 2014-05-05 NOTE — Progress Notes (Signed)
Breech by [place] and exam today.

## 2014-05-05 NOTE — Progress Notes (Signed)
External cephalic version 16/10/96 @ 9a with [employer].  Pt informed NPO after midnight.

## 2014-05-05 NOTE — Progress Notes (Signed)
Edema- hands/feet   Pressure/pain- vaginal

## 2014-05-06 ENCOUNTER — Observation Stay (HOSPITAL_COMMUNITY): Payer: Medicaid Other | Admitting: Anesthesiology

## 2014-05-06 ENCOUNTER — Encounter (HOSPITAL_COMMUNITY)
Admission: AD | Disposition: A | Discharge status: Home / Self Care | Payer: Self-pay | Source: Ambulatory Visit | Attending: Family Medicine

## 2014-05-06 ENCOUNTER — Encounter (HOSPITAL_COMMUNITY): Payer: Self-pay | Admitting: *Deleted

## 2014-05-06 ENCOUNTER — Inpatient Hospital Stay (HOSPITAL_COMMUNITY)
Admission: AD | Admit: 2014-05-06 | Discharge: 2014-05-08 | DRG: 765 | Disposition: A | Discharge status: Home / Self Care | Payer: Medicaid Other | Source: Ambulatory Visit | Attending: Family Medicine | Admitting: Family Medicine

## 2014-05-06 DIAGNOSIS — O321XX0 Maternal care for breech presentation, not applicable or unspecified: Secondary | ICD-10-CM | POA: Diagnosis present

## 2014-05-06 DIAGNOSIS — F17210 Nicotine dependence, cigarettes, uncomplicated: Secondary | ICD-10-CM | POA: Diagnosis present

## 2014-05-06 DIAGNOSIS — F1910 Other psychoactive substance abuse, uncomplicated: Secondary | ICD-10-CM

## 2014-05-06 DIAGNOSIS — Z823 Family history of stroke: Secondary | ICD-10-CM

## 2014-05-06 DIAGNOSIS — Z833 Family history of diabetes mellitus: Secondary | ICD-10-CM | POA: Diagnosis not present

## 2014-05-06 DIAGNOSIS — F1110 Opioid abuse, uncomplicated: Secondary | ICD-10-CM | POA: Diagnosis present

## 2014-05-06 DIAGNOSIS — O2301 Infections of kidney in pregnancy, first trimester: Secondary | ICD-10-CM

## 2014-05-06 DIAGNOSIS — IMO0001 Reserved for inherently not codable concepts without codable children: Secondary | ICD-10-CM | POA: Diagnosis present

## 2014-05-06 DIAGNOSIS — O99334 Smoking (tobacco) complicating childbirth: Secondary | ICD-10-CM | POA: Diagnosis present

## 2014-05-06 DIAGNOSIS — Z3A39 39 weeks gestation of pregnancy: Secondary | ICD-10-CM

## 2014-05-06 DIAGNOSIS — Z8249 Family history of ischemic heart disease and other diseases of the circulatory system: Secondary | ICD-10-CM | POA: Diagnosis not present

## 2014-05-06 DIAGNOSIS — O99324 Drug use complicating childbirth: Secondary | ICD-10-CM | POA: Diagnosis present

## 2014-05-06 DIAGNOSIS — O09293 Supervision of pregnancy with other poor reproductive or obstetric history, third trimester: Secondary | ICD-10-CM

## 2014-05-06 DIAGNOSIS — O0993 Supervision of high risk pregnancy, unspecified, third trimester: Secondary | ICD-10-CM

## 2014-05-06 DIAGNOSIS — O99323 Drug use complicating pregnancy, third trimester: Secondary | ICD-10-CM

## 2014-05-06 DIAGNOSIS — O1002 Pre-existing essential hypertension complicating childbirth: Secondary | ICD-10-CM | POA: Diagnosis present

## 2014-05-06 DIAGNOSIS — O99320 Drug use complicating pregnancy, unspecified trimester: Secondary | ICD-10-CM

## 2014-05-06 DIAGNOSIS — O1403 Mild to moderate pre-eclampsia, third trimester: Secondary | ICD-10-CM

## 2014-05-06 LAB — COMPREHENSIVE METABOLIC PANEL
ALT: 10 U/L (ref 0–35)
AST: 17 U/L (ref 0–37)
Albumin: 2.7 g/dL — ABNORMAL LOW (ref 3.5–5.2)
Alkaline Phosphatase: 211 U/L — ABNORMAL HIGH (ref 39–117)
Anion gap: 4 — ABNORMAL LOW (ref 5–15)
BUN: 12 mg/dL (ref 6–23)
CO2: 23 mmol/L (ref 19–32)
Calcium: 8.4 mg/dL (ref 8.4–10.5)
Chloride: 106 mmol/L (ref 96–112)
Creatinine, Ser: 0.58 mg/dL (ref 0.50–1.10)
GFR calc Af Amer: >90 mL/min (ref >90)
GFR calc non Af Amer: >90 mL/min (ref >90)
Glucose, Bld: 116 mg/dL — ABNORMAL HIGH (ref 70–99)
Potassium: 3.9 mmol/L (ref 3.5–5.1)
Sodium: 133 mmol/L — ABNORMAL LOW (ref 135–145)
Total Bilirubin: 0.6 mg/dL (ref 0.3–1.2)
Total Protein: 5.5 g/dL — ABNORMAL LOW (ref 6.0–8.3)

## 2014-05-06 LAB — CBC
HCT: 32.9 % — ABNORMAL LOW (ref 36.0–46.0)
Hemoglobin: 11.3 g/dL — ABNORMAL LOW (ref 12.0–15.0)
MCH: 32.4 pg (ref 26.0–34.0)
MCHC: 34.3 g/dL (ref 30.0–36.0)
MCV: 94.3 fL (ref 78.0–100.0)
Platelets: 180 10*3/uL (ref 150–400)
RBC: 3.49 MIL/uL — ABNORMAL LOW (ref 3.87–5.11)
RDW: 12.4 % (ref 11.5–15.5)
WBC: 6.6 10*3/uL (ref 4.0–10.5)

## 2014-05-06 LAB — RAPID URINE DRUG SCREEN, HOSP PERFORMED
Amphetamines: NOT DETECTED
Barbiturates: NOT DETECTED
Benzodiazepines: NOT DETECTED
Cocaine: NOT DETECTED
Opiates: NOT DETECTED
Tetrahydrocannabinol: NOT DETECTED

## 2014-05-06 SURGERY — Surgical Case
Anesthesia: Spinal

## 2014-05-06 MED ORDER — TETANUS-DIPHTH-ACELL PERTUSSIS 5-2.5-18.5 LF-MCG/0.5 IM SUSP: 0.5000 mL | Freq: Once | INTRAMUSCULAR | Status: DC

## 2014-05-06 MED ORDER — SIMETHICONE 80 MG PO CHEW: 80.0000 mg | CHEWABLE_TABLET | ORAL | Status: DC | PRN

## 2014-05-06 MED ORDER — ACETAMINOPHEN 10 MG/ML IV SOLN
1000.0000 mg | Freq: Once | INTRAVENOUS | Status: AC
  Administered 2014-05-06: 1000 mg via INTRAVENOUS
  Filled 2014-05-06: qty 100

## 2014-05-06 MED ORDER — ALBUTEROL SULFATE (2.5 MG/3ML) 0.083% IN NEBU: 3.0000 mL | INHALATION_SOLUTION | Freq: Four times a day (QID) | RESPIRATORY_TRACT | Status: DC | PRN

## 2014-05-06 MED ORDER — PRENATAL MULTIVITAMIN CH: 1.0000 | ORAL_TABLET | Freq: Every day | ORAL | Status: DC

## 2014-05-06 MED ORDER — MORPHINE SULFATE 0.5 MG/ML IJ SOLN
INTRAMUSCULAR | Status: AC
  Filled 2014-05-06: qty 10

## 2014-05-06 MED ORDER — METHADONE HCL 10 MG/ML PO CONC: 64.0000 mg | Freq: Every day | ORAL | Status: DC

## 2014-05-06 MED ORDER — DIPHENHYDRAMINE HCL 25 MG PO CAPS: 25.0000 mg | ORAL_CAPSULE | Freq: Four times a day (QID) | ORAL | Status: DC | PRN

## 2014-05-06 MED ORDER — ONDANSETRON HCL 4 MG/2ML IJ SOLN
INTRAMUSCULAR | Status: DC | PRN
  Administered 2014-05-06: 4 mg via INTRAVENOUS

## 2014-05-06 MED ORDER — LACTATED RINGERS IV SOLN
40.0000 [IU] | INTRAVENOUS | Status: DC | PRN
  Administered 2014-05-06: 40 [IU] via INTRAVENOUS

## 2014-05-06 MED ORDER — MEPERIDINE HCL 25 MG/ML IJ SOLN
INTRAMUSCULAR | Status: AC
  Administered 2014-05-06: 12.5 mg via INTRAVENOUS
  Filled 2014-05-06: qty 1

## 2014-05-06 MED ORDER — PRENATAL MULTIVITAMIN CH
1.0000 | ORAL_TABLET | Freq: Every day | ORAL | Status: DC
  Administered 2014-05-07: 1 via ORAL
  Filled 2014-05-06: qty 1

## 2014-05-06 MED ORDER — HYDROMORPHONE HCL 1 MG/ML IJ SOLN
0.2500 mg | INTRAMUSCULAR | Status: DC | PRN
  Administered 2014-05-06 (×4): 0.5 mg via INTRAVENOUS

## 2014-05-06 MED ORDER — IBUPROFEN 600 MG PO TABS
600.0000 mg | ORAL_TABLET | Freq: Four times a day (QID) | ORAL | Status: DC
  Administered 2014-05-06 – 2014-05-08 (×6): 600 mg via ORAL
  Filled 2014-05-06 (×6): qty 1

## 2014-05-06 MED ORDER — SCOPOLAMINE 1 MG/3DAYS TD PT72
MEDICATED_PATCH | TRANSDERMAL | Status: DC | PRN
  Administered 2014-05-06: 1 via TRANSDERMAL

## 2014-05-06 MED ORDER — WITCH HAZEL-GLYCERIN EX PADS: 1.0000 "application " | MEDICATED_PAD | CUTANEOUS | Status: DC | PRN

## 2014-05-06 MED ORDER — HYDROMORPHONE HCL 1 MG/ML IJ SOLN
INTRAMUSCULAR | Status: AC
  Administered 2014-05-06: 0.5 mg via INTRAVENOUS
  Filled 2014-05-06: qty 1

## 2014-05-06 MED ORDER — OXYCODONE-ACETAMINOPHEN 5-325 MG PO TABS
1.0000 | ORAL_TABLET | ORAL | Status: DC | PRN
  Administered 2014-05-07 – 2014-05-08 (×2): 1 via ORAL
  Filled 2014-05-06 (×2): qty 1

## 2014-05-06 MED ORDER — OXYBUTYNIN CHLORIDE 5 MG PO TABS
5.0000 mg | ORAL_TABLET | Freq: Two times a day (BID) | ORAL | Status: DC
  Filled 2014-05-06: qty 1

## 2014-05-06 MED ORDER — ASPIRIN 81 MG PO TABS: 81.0000 mg | ORAL_TABLET | Freq: Every day | ORAL | Status: DC

## 2014-05-06 MED ORDER — ONDANSETRON HCL 4 MG/2ML IJ SOLN: 4.0000 mg | INTRAMUSCULAR | Status: DC | PRN

## 2014-05-06 MED ORDER — SODIUM CHLORIDE 0.9 % IJ SOLN: 3.0000 mL | INTRAMUSCULAR | Status: DC | PRN

## 2014-05-06 MED ORDER — SCOPOLAMINE 1 MG/3DAYS TD PT72
MEDICATED_PATCH | TRANSDERMAL | Status: AC
Start: 2014-05-06 — End: 2014-05-06
  Filled 2014-05-06: qty 1

## 2014-05-06 MED ORDER — ZOLPIDEM TARTRATE 5 MG PO TABS: 5.0000 mg | ORAL_TABLET | Freq: Every evening | ORAL | Status: DC | PRN

## 2014-05-06 MED ORDER — SENNOSIDES-DOCUSATE SODIUM 8.6-50 MG PO TABS
2.0000 | ORAL_TABLET | ORAL | Status: DC
  Administered 2014-05-06 – 2014-05-07 (×2): 2 via ORAL
  Filled 2014-05-06 (×2): qty 2

## 2014-05-06 MED ORDER — CITRIC ACID-SODIUM CITRATE 334-500 MG/5ML PO SOLN
30.0000 mL | Freq: Once | ORAL | Status: AC
  Administered 2014-05-06: 30 mL via ORAL
  Filled 2014-05-06: qty 15

## 2014-05-06 MED ORDER — SODIUM CHLORIDE 0.9 % IV SOLN: 250.0000 mL | INTRAVENOUS | Status: DC | PRN

## 2014-05-06 MED ORDER — MEPERIDINE HCL 25 MG/ML IJ SOLN
12.5000 mg | Freq: Once | INTRAMUSCULAR | Status: AC
  Administered 2014-05-06: 12.5 mg via INTRAVENOUS

## 2014-05-06 MED ORDER — SIMETHICONE 80 MG PO CHEW
80.0000 mg | CHEWABLE_TABLET | ORAL | Status: DC
  Administered 2014-05-06 – 2014-05-07 (×2): 80 mg via ORAL
  Filled 2014-05-06 (×2): qty 1

## 2014-05-06 MED ORDER — BUPIVACAINE HCL (PF) 0.25 % IJ SOLN
INTRAMUSCULAR | Status: AC
  Filled 2014-05-06: qty 30

## 2014-05-06 MED ORDER — BUPIVACAINE HCL (PF) 0.25 % IJ SOLN
INTRAMUSCULAR | Status: DC | PRN
Start: 2014-05-06 — End: 2014-05-06
  Administered 2014-05-06: 20 mL

## 2014-05-06 MED ORDER — MENTHOL 3 MG MT LOZG: 1.0000 | LOZENGE | OROMUCOSAL | Status: DC | PRN

## 2014-05-06 MED ORDER — SODIUM CHLORIDE 0.9 % IJ SOLN
3.0000 mL | Freq: Two times a day (BID) | INTRAMUSCULAR | Status: DC
Start: 2014-05-06 — End: 2014-05-06

## 2014-05-06 MED ORDER — OXYTOCIN 10 UNIT/ML IJ SOLN
INTRAMUSCULAR | Status: AC
  Filled 2014-05-06: qty 4

## 2014-05-06 MED ORDER — PHENYLEPHRINE 8 MG IN D5W 100 ML (0.08MG/ML) PREMIX OPTIME
INJECTION | INTRAVENOUS | Status: DC | PRN
  Administered 2014-05-06: 40 ug/min via INTRAVENOUS

## 2014-05-06 MED ORDER — OXYCODONE-ACETAMINOPHEN 5-325 MG PO TABS
2.0000 | ORAL_TABLET | ORAL | Status: DC | PRN
Start: 2014-05-06 — End: 2014-05-08
  Administered 2014-05-07 (×3): 2 via ORAL
  Filled 2014-05-06 (×3): qty 2

## 2014-05-06 MED ORDER — LACTATED RINGERS IV SOLN
INTRAVENOUS | Status: DC
  Administered 2014-05-06: 16:00:00 via INTRAVENOUS

## 2014-05-06 MED ORDER — CEFAZOLIN SODIUM-DEXTROSE 2-3 GM-% IV SOLR
2.0000 g | Freq: Once | INTRAVENOUS | Status: AC
  Administered 2014-05-06: 2 g via INTRAVENOUS
  Filled 2014-05-06: qty 50

## 2014-05-06 MED ORDER — METHADONE HCL 10 MG/ML PO CONC
16.0000 mg | Freq: Four times a day (QID) | ORAL | Status: DC
  Administered 2014-05-06 – 2014-05-08 (×6): 16 mg via ORAL
  Filled 2014-05-06 (×8): qty 1.6

## 2014-05-06 MED ORDER — LACTATED RINGERS IV SOLN
INTRAVENOUS | Status: DC
  Administered 2014-05-07: 05:00:00 via INTRAVENOUS

## 2014-05-06 MED ORDER — DIBUCAINE 1 % RE OINT: 1.0000 "application " | TOPICAL_OINTMENT | RECTAL | Status: DC | PRN

## 2014-05-06 MED ORDER — FENTANYL CITRATE 0.05 MG/ML IJ SOLN
INTRAMUSCULAR | Status: AC
  Filled 2014-05-06: qty 2

## 2014-05-06 MED ORDER — OXYTOCIN 40 UNITS IN LACTATED RINGERS INFUSION - SIMPLE MED: 62.5000 mL/h | INTRAVENOUS | Status: AC

## 2014-05-06 MED ORDER — ONDANSETRON HCL 4 MG/2ML IJ SOLN
INTRAMUSCULAR | Status: AC
  Filled 2014-05-06: qty 2

## 2014-05-06 MED ORDER — LANOLIN HYDROUS EX OINT: 1.0000 "application " | TOPICAL_OINTMENT | CUTANEOUS | Status: DC | PRN

## 2014-05-06 MED ORDER — ONDANSETRON HCL 4 MG PO TABS: 4.0000 mg | ORAL_TABLET | ORAL | Status: DC | PRN

## 2014-05-06 MED ORDER — ONDANSETRON 8 MG PO TBDP
8.0000 mg | ORAL_TABLET | Freq: Three times a day (TID) | ORAL | Status: DC | PRN
  Filled 2014-05-06: qty 1

## 2014-05-06 MED ORDER — BUPIVACAINE LIPOSOME 1.3 % IJ SUSP
20.0000 mL | Freq: Once | INTRAMUSCULAR | Status: AC
  Administered 2014-05-06: 20 mL
  Filled 2014-05-06: qty 20

## 2014-05-06 MED ORDER — SIMETHICONE 80 MG PO CHEW
80.0000 mg | CHEWABLE_TABLET | Freq: Three times a day (TID) | ORAL | Status: DC
  Administered 2014-05-07 – 2014-05-08 (×4): 80 mg via ORAL
  Filled 2014-05-06 (×4): qty 1

## 2014-05-06 SURGICAL SUPPLY — 33 items
APL SKNCLS STERI-STRIP NONHPOA (GAUZE/BANDAGES/DRESSINGS) ×1
BENZOIN TINCTURE PRP APPL 2/3 (GAUZE/BANDAGES/DRESSINGS) ×2 IMPLANT
CATH ROBINSON RED A/P 16FR (CATHETERS) IMPLANT
CLAMP CORD UMBIL (MISCELLANEOUS) IMPLANT
CLOTH BEACON ORANGE TIMEOUT ST (SAFETY) ×2 IMPLANT
DRAPE SHEET LG 3/4 BI-LAMINATE (DRAPES) IMPLANT
DRSG OPSITE POSTOP 4X10 (GAUZE/BANDAGES/DRESSINGS) ×2 IMPLANT
DURAPREP 26ML APPLICATOR (WOUND CARE) ×2 IMPLANT
ELECT REM PT RETURN 9FT ADLT (ELECTROSURGICAL) ×2
ELECTRODE REM PT RTRN 9FT ADLT (ELECTROSURGICAL) ×1 IMPLANT
EXTRACTOR VACUUM M CUP 4 TUBE (SUCTIONS) IMPLANT
GLOVE BIOGEL PI IND STRL 7.5 (GLOVE) ×2 IMPLANT
GLOVE BIOGEL PI INDICATOR 7.5 (GLOVE) ×2
GLOVE ECLIPSE 7.5 STRL STRAW (GLOVE) ×2 IMPLANT
GOWN STRL REUS W/TWL LRG LVL3 (GOWN DISPOSABLE) ×6 IMPLANT
KIT ABG SYR 3ML LUER SLIP (SYRINGE) IMPLANT
NEEDLE HYPO 22GX1.5 SAFETY (NEEDLE) ×2 IMPLANT
NEEDLE HYPO 25X5/8 SAFETYGLIDE (NEEDLE) IMPLANT
NS IRRIG 1000ML POUR BTL (IV SOLUTION) ×2 IMPLANT
PACK C SECTION WH (CUSTOM PROCEDURE TRAY) ×2 IMPLANT
PAD OB MATERNITY 4.3X12.25 (PERSONAL CARE ITEMS) ×2 IMPLANT
RTRCTR C-SECT PINK 25CM LRG (MISCELLANEOUS) IMPLANT
STRIP CLOSURE SKIN 1/2X4 (GAUZE/BANDAGES/DRESSINGS) ×2 IMPLANT
SUT MNCRL 0 VIOLET CTX 36 (SUTURE) IMPLANT
SUT MONOCRYL 0 CTX 36 (SUTURE)
SUT VIC AB 0 CTX 36 (SUTURE) ×6
SUT VIC AB 0 CTX36XBRD ANBCTRL (SUTURE) ×3 IMPLANT
SUT VIC AB 2-0 CT1 27 (SUTURE) ×2
SUT VIC AB 2-0 CT1 TAPERPNT 27 (SUTURE) ×1 IMPLANT
SUT VIC AB 4-0 KS 27 (SUTURE) ×2 IMPLANT
SYR 30ML LL (SYRINGE) ×2 IMPLANT
TOWEL OR 17X24 6PK STRL BLUE (TOWEL DISPOSABLE) ×2 IMPLANT
TRAY FOLEY CATH 14FR (SET/KITS/TRAYS/PACK) ×2 IMPLANT

## 2014-05-06 NOTE — Anesthesia Postprocedure Evaluation (Signed)
  Anesthesia Post-op Note  Patient: Victoria Alvarez  Procedure(s) Performed: Procedure(s): CESAREAN SECTION (N/A)  Patient is awake, responsive, moving her legs, and has signs of resolution of her numbness. Pain and nausea are reasonably well controlled. Vital signs are stable and clinically acceptable. Oxygen saturation is clinically acceptable. There are no apparent anesthetic complications at this time. Patient is ready for discharge.

## 2014-05-06 NOTE — Anesthesia Preprocedure Evaluation (Signed)
Anesthesia Evaluation  Patient identified by MRN, date of birth, ID band Patient awake    Reviewed: Allergy & Precautions, H&P , Patient's Chart, lab work & pertinent test results  Airway Mallampati: II  TM Distance: >3 FB Neck ROM: full    Dental no notable dental hx.    Pulmonary asthma , Current Smoker,  breath sounds clear to auscultation  Pulmonary exam normal       Cardiovascular Exercise Tolerance: Good Rhythm:regular Rate:Normal     Neuro/Psych    GI/Hepatic   Endo/Other    Renal/GU      Musculoskeletal   Abdominal   Peds  Hematology   Anesthesia Other Findings   Reproductive/Obstetrics                             Anesthesia Physical Anesthesia Plan  ASA: II  Anesthesia Plan: Spinal   Post-op Pain Management:    Induction:   Airway Management Planned:   Additional Equipment:   Intra-op Plan:   Post-operative Plan:   Informed Consent: I have reviewed the patients History and Physical, chart, labs and discussed the procedure including the risks, benefits and alternatives for the proposed anesthesia with the patient or authorized representative who has indicated his/her understanding and acceptance.   Dental Advisory Given  Plan Discussed with: CRNA  Anesthesia Plan Comments: (Lab work confirmed with CRNA in room. Platelets okay. Discussed spinal anesthetic, and patient consents to the procedure:  included risk of possible headache,backache, failed block, allergic reaction, and nerve injury. This patient was asked if she had any questions or concerns before the procedure started. )        Anesthesia Quick Evaluation

## 2014-05-06 NOTE — Anesthesia Procedure Notes (Addendum)
Spinal Patient location during procedure: OR Preanesthetic Checklist Completed: patient identified, site marked, surgical consent, pre-op evaluation, timeout performed, IV checked, risks and benefits discussed and monitors and equipment checked Spinal Block Patient position: sitting Prep: DuraPrep Patient monitoring: heart rate, cardiac monitor, continuous pulse ox and blood pressure Approach: midline Location: L3-4 Injection technique: single-shot Needle Needle type: Sprotte  Needle gauge: 24 G Needle length: 9 cm Assessment Sensory level: T4 Additional Notes Spinal Dosage in OR  Bupivicaine ml       1.4

## 2014-05-06 NOTE — Transfer of Care (Signed)
Immediate Anesthesia Transfer of Care Note  Patient: Victoria Alvarez  Procedure(s) Performed: Procedure(s): CESAREAN SECTION (N/A)  Patient Location: PACU  Anesthesia Type:Spinal  Level of Consciousness: awake, alert  and oriented  Airway & Oxygen Therapy: Patient Spontanous Breathing  Post-op Assessment: Report given to RN and Post -op Vital signs reviewed and stable  Post vital signs: Reviewed and stable  Last Vitals:  Filed Vitals:   05/06/14 1112  BP: 120/69  Pulse: 75    Complications: No apparent anesthesia complications

## 2014-05-06 NOTE — Progress Notes (Signed)
Pt states she has not been consented for this procedure will await Dr. Loreta Ave to consent pt before proceeding

## 2014-05-06 NOTE — Op Note (Signed)
Victoria Alvarez PROCEDURE DATE: 05/06/2014  PREOPERATIVE DIAGNOSES: Intrauterine pregnancy at [redacted]w[redacted]d weeks gestation; Homero Fellers breech presentation  POSTOPERATIVE DIAGNOSES: The same  PROCEDURE: Primary Low Transverse Cesarean Section  SURGEON:  Dr. Candelaria Celeste  ASSISTANT:  Fredirick Lathe, MD  ANESTHESIOLOGIST: Dr. Cristela Blue  INDICATIONS: Victoria Alvarez is a 24 y.o. [id number] at [redacted]w[redacted]d here for cesarean section secondary to the indications listed under preoperative diagnoses; please see preoperative note for further details.  The risks of cesarean section were discussed with the patient including but were not limited to: bleeding which may require transfusion or reoperation; infection which may require antibiotics; injury to bowel, bladder, ureters or other surrounding organs; injury to the fetus; need for additional procedures including hysterectomy in the event of a life-threatening hemorrhage; placental abnormalities wth subsequent pregnancies, incisional problems, thromboembolic phenomenon and other postoperative/anesthesia complications.   The patient concurred with the proposed plan, giving informed written consent for the procedure.    FINDINGS:  Viable female infant in cephalic presentation.  Apgars 8 and 9 at 1654.  Clear amniotic fluid.  Intact placenta, three vessel cord.  Normal uterus, fallopian tubes and ovaries bilaterally.  Loose nuchal x 1  ANESTHESIA: Spinal INTRAVENOUS FLUIDS: 2300 ml ESTIMATED BLOOD LOSS: 600 ml URINE OUTPUT:  150 ml SPECIMENS: Placenta sent to L&D COMPLICATIONS: None immediate  PROCEDURE IN DETAIL:  The patient preoperatively received intravenous antibiotics and had sequential compression devices applied to her lower extremities.  She was then taken to the operating room where spinal anesthesia was administered and was found to be adequate. She was then placed in a dorsal supine position with a leftward tilt, and prepped and draped in a sterile manner.  A foley  catheter was placed into her bladder and attached to constant gravity.  After an adequate timeout was performed, a Pfannenstiel skin incision was made with scalpel and carried through to the underlying layer of fascia. The fascia was incised in the midline, and this incision was extended bilaterally using the Mayo scissors.  Kocher clamps were applied to the superior aspect of the fascial incision and the underlying rectus muscles were dissected off bluntly. The rectus muscles were separated in the midline bluntly and the peritoneum was entered bluntly. Attention was turned to the lower uterine segment where a low transverse hysterotomy was made with a scalpel and extended bilaterally bluntly.  The infant was successfully delivered, the cord was clamped and cut and the infant was handed over to awaiting neonatology team. Uterine massage was then administered, and the placenta delivered intact with a three-vessel cord. The uterus was then cleared of clot and debris.  The hysterotomy was closed with 0 Vicryl in a running locked fashion, and an imbricating layer was also placed with 0 Vicryl. The pelvis was cleared of all clot and debris. Hemostasis was confirmed on all surfaces.  The peritoneum and the muscles were reapproximated using 0 Vicryl interrupted stitches. The fascia was then closed using 0 Vicryl in a running fashion.  The subcutaneous layer was irrigated, and 30 ml of 0.25% Marcaine mixed with 1.3% 38mL Exparel was injected subcutaneously around the incision and into the fascia.  The skin was closed with a 4-0 Vicryl subcuticular stitch. The patient tolerated the procedure well. Sponge, lap, instrument and needle counts were correct x 2.  She was taken to the recovery room in stable condition.   Fredirick Lathe, MD OB Fellow Faculty [facility], [facility]

## 2014-05-06 NOTE — H&P (Signed)
Hospital Admission History and Physical Service Phone: [phone]  Patient name: Victoria Alvarez Medical record number: [id number] Date of birth: 1990/04/15 Age: 24 y.o. Gender: female  Primary Care Provider: LAND, PHILLIP, PA-C Code Status: Full  Assessment and Plan: Victoria Alvarez is a 24 y.o. female G3P1011 at 39 weeks and 4 days presenting with breech presentation. PMH is significant for Substance Abuse currently in [facility] treatment and history of Pre-eclampsia.  # Breech Presentation- verified with [place] - Place in observation, [facility] attending - Monitor fetal heart tones - Will discuss risks and benefits and obtain consent for Cephalic Version. If agrees to Version, will proceed with procedure and monitor fetal heart tones. If refuses procedure will discharge with precautions.  # Hypertension- History of Pre-eclampsia. BP 130/73 today. Complains of headache.  - Will obtain pre-eclampsia workup, including CBC, CMP, urinalysis, and protein-creatinine ratio - Monitor BP  # History of Drug Abuse- currently in [facility] treatment. Last UDS 02/2014 positive for THC and [facility]one.  - Follow up UDS  FEN/GI: NPO. Saline lock  Disposition: Place in observation. Discharge pending consent for Version  History of Present Illness: Victoria Alvarez is a 24 y.o. female presenting with breech presentation by [place] noted in clinic on 05/05/14. Was told at clinic to come for cephalic version today. Reports decreased fetal movement, which is noted in previous visits to clinic on 2/22. BPP on 2/22 8/8 with low normal amniotic fluid volume and breech presentation noted. Denies loss of fluid. Denies vaginal discharge. Complains of spotting of blood on toilet tissue, but states "she couldn't really see any blood." Complains of constipation resolved with stool softeners. Complains of mild headache since yesterday.   Review Of Systems: Per HPI  Otherwise 12 point review of systems was performed and was  unremarkable.  Patient Active Problem List   Diagnosis Date Noted  . Cephalic version 19/14/7829  . 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 [redacted] weeks gestation of pregnancy   . Decreased fetal movement during pregnancy, antepartum   . Pneumonia complicating pregnancy 03/06/2014  . Supervision of high risk pregnancy in third trimester 03/01/2014  . Substance abuse affecting pregnancy in third trimester, antepartum 03/01/2014  . Marijuana smoker 03/01/2014  . Current smoker 03/01/2014  . Hx of preeclampsia, prior pregnancy, currently pregnant 03/01/2014  . Pyelonephritis 10/27/2013  . Pyelonephritis affecting pregnancy in first trimester, antepartum 09/25/2013   Past Medical History: Past Medical History  Diagnosis Date  . Interstitial cystitis   . Chronic pelvic pain in female   . Headache(784.0)   . Infection     UTI  . Pyelonephritis affecting pregnancy in first trimester July 2015  . Depression 2007   Past Surgical History: Past Surgical History  Procedure Laterality Date  . Hernia repair    . Vagina surgery      tumor removed, went to cancer center in [place]   Social History: History  Substance Use Topics  . Smoking status: Current Every Day Smoker -- 0.50 packs/day for 8 years    Types: Cigarettes  . Smokeless tobacco: Not on file     Comment: cutting back  . Alcohol Use: No   Please also refer to relevant sections of EMR.  Family History: Family History  Problem Relation Age of Onset  . Diabetes Mother   . Heart disease Mother   . Hypertension Father   . Diabetes Maternal Grandmother   . Hypertension Maternal Grandmother   . Stroke Maternal Grandmother   .  Diabetes Maternal Grandfather   . Hypertension Maternal Grandfather   . Diabetes Paternal Grandmother   . Diabetes Paternal Grandfather    Allergies and Medications: Allergies  Allergen Reactions  . Ciprofloxacin Diarrhea and Nausea And Vomiting   No  current facility-administered medications on file prior to encounter.   Current Outpatient Prescriptions on File Prior to Encounter  Medication Sig Dispense Refill  . albuterol (PROVENTIL HFA;VENTOLIN HFA) 108 (90 BASE) MCG/ACT inhaler Inhale 2 puffs into the lungs every 6 (six) hours as needed for wheezing or shortness of breath. 1 Inhaler 2  . aspirin 81 MG tablet Take 81 mg by mouth daily.    . methadone (DOLOPHINE) 10 MG/ML solution Take 64 mg by mouth daily.     . ondansetron (ZOFRAN ODT) 8 MG disintegrating tablet Take 1 tablet (8 mg total) by mouth every 8 (eight) hours as needed for nausea or vomiting. 20 tablet 0  . oxybutynin (DITROPAN) 5 MG tablet Take 1 tablet (5 mg total) by mouth 2 (two) times daily. 60 tablet 3  . Prenatal Vit-Fe Fumarate-FA (PRENATAL MULTIVITAMIN) TABS tablet Take 1 tablet by mouth daily at 12 noon. 30 tablet 3    Objective: BP 130/73 mmHg  Pulse 75  LMP 09/24/2013 Exam: General: 23yo female G3P1011 at 39weeks and 4 days in no apparent distress Cardiovascular: S1 and S2 noted. No murmurs/rubs/gallops. Regular rate and rhythm. Respiratory: Clear to auscultation bilaterally. No wheezes/rales/rhonchi. No increased work of breathing. Extremities: No edema noted. Fetal Monitor: baseline 120, moderate variability, reactive accelerations, no decelerations  Labs and Imaging: CBC BMET  No results for input(s): WBC, HGB, HCT, PLT in the last 168 hours. No results for input(s): NA, K, CL, CO2, BUN, CREATININE, GLUCOSE, CALCIUM in the last 168 hours.   [address] [place], [place] 05/06/2014, 10:44 AM PGY-1, [facility]  OB fellow attestation:  I have seen and examined this patient; I agree with above documentation in the resident's note.   Victoria Alvarez is a 24 y.o. G3P1011 here for eternal cephalic version 2/2 breech presentation.  Patient currently on methadone.  PE: BP 120/69 mmHg  Pulse 75  LMP 09/24/2013 Gen: calm comfortable, NAD Resp:  normal effort, no distress Abd: gravid  ROS, labs, PMH reviewed  Plan: declined version and requested cesarean section. MOF: breast and bottle MOC: depo ID: [id number] FWB: cat I  The risks of cesarean section discussed with the patient included but were not limited to: bleeding which may require transfusion or reoperation; infection which may require antibiotics; injury to bowel, bladder, ureters or other surrounding organs; injury to the fetus; need for additional procedures including hysterectomy in the event of a life-threatening hemorrhage; placental abnormalities wth subsequent pregnancies, incisional problems, thromboembolic phenomenon and other postoperative/anesthesia complications. The patient concurred with the proposed plan, giving informed written consent for the procedure.   Patient has been NPO since before midnight she will remain NPO for procedure. Anesthesia and OR aware. Preoperative prophylactic antibiotics and SCDs ordered on call to the OR.  To OR when ready.       Victoria Alvarez 05/06/2014, 11:49 AM

## 2014-05-07 LAB — CBC
HCT: 24.2 % — ABNORMAL LOW (ref 36.0–46.0)
Hemoglobin: 8.4 g/dL — ABNORMAL LOW (ref 12.0–15.0)
MCH: 32.6 pg (ref 26.0–34.0)
MCHC: 34.7 g/dL (ref 30.0–36.0)
MCV: 93.8 fL (ref 78.0–100.0)
Platelets: 171 10*3/uL (ref 150–400)
RBC: 2.58 MIL/uL — ABNORMAL LOW (ref 3.87–5.11)
RDW: 12.3 % (ref 11.5–15.5)
WBC: 6.9 10*3/uL (ref 4.0–10.5)

## 2014-05-07 LAB — TYPE AND SCREEN
ABO/RH(D): O NEG
Antibody Screen: POSITIVE
DAT, IgG: NEGATIVE
Unit division: 0
Unit division: 0

## 2014-05-07 LAB — RPR: RPR Ser Ql: NONREACTIVE

## 2014-05-07 MED ORDER — RHO D IMMUNE GLOBULIN 1500 UNIT/2ML IJ SOSY
300.0000 ug | PREFILLED_SYRINGE | Freq: Once | INTRAMUSCULAR | Status: AC
  Administered 2014-05-07: 300 ug via INTRAVENOUS
  Filled 2014-05-07: qty 2

## 2014-05-07 NOTE — Progress Notes (Signed)
Subjective:  Victoria Alvarez is a 24 y.o. W1X9147 [redacted]w[redacted]d s/p cesarean section secondary to breeched presentation.  No acute events overnight.  Pt denies problems with ambulating, voiding or po intake.  She denies nausea or vomiting.  Pain is moderately controlled.  She has not had flatus. She has not had bowel movement.  Lochia Minimal.  Plan for birth control is Depo-Provera.  Method of Feeding: Breast  Objective: Blood pressure 105/54, pulse 56, temperature 98 F (36.7 C), temperature source Oral, resp. rate 20, height  (1.6 m), weight 85.276 kg (188 lb), last menstrual period 09/24/2013, SpO2 98 %, unknown if currently breastfeeding.  Physical Exam:  General: alert, cooperative and no distress Lochia:normal flow Chest: mild expiratory wheeze noted Heart: RRR no m/r/g Abdomen: +BS, soft, nontender,  Uterine Fundus: firm DVT Evaluation: No evidence of DVT seen on physical exam. Extremities: mild edema   Recent Labs  05/06/14 1100 05/07/14 0548  HGB 11.3* 8.4*  HCT 32.9* 24.2*   Assessment/Plan:  ASSESSMENT: Victoria Alvarez is a 24 y.o. W2N5621 [redacted]w[redacted]d s/p cesarean section.  Plan for discharge tomorrow, Breastfeeding and Lactation consult   LOS: 1 day   Araceli Bouche 05/07/2014, 9:11 AM

## 2014-05-07 NOTE — Progress Notes (Signed)
Clinical Social Work Department PSYCHOSOCIAL ASSESSMENT - MATERNAL/CHILD 05/07/2014  Patient:  Victoria Alvarez, Victoria Alvarez  Account Number:  [id number]  Admit Date:  05/06/2014  Ardine Eng Name:   Victoria Alvarez    Clinical Social Worker:  Promyse Ardito, LCSW   Date/Time:  05/07/2014 11:00 AM  Date Referred:  05/06/2014      Referred reason  Substance Abuse  Depression/Anxiety   Other referral source:    I:  FAMILY / HOME ENVIRONMENT Child's legal guardian:  PARENT  Guardian - Name Guardian - Age Guardian - Address  [facility] [phone]  [place], [place] [address]  Victoria Alvarez  same as above   Other household support members/support persons Other support:    II  PSYCHOSOCIAL DATA Information Source:    Insurance risk surveyor Resources Employment:   FOB is employed   Museum/gallery curator resources:  [employer] If [place]:   Other  [place] / Grade:   Maternity Care Coordinator / Child Services Coordination / Early Interventions:  Cultural issues impacting care:    III  STRENGTHS Strengths  Supportive family/friends  Home prepared for Child (including basic supplies)  Adequate Resources   Strength comment:    IV  RISK FACTORS AND CURRENT PROBLEMS Current Problem:     Risk Factor & Current Problem Patient Issue Family Issue Risk Factor / Current Problem Comment  Mental Illness Y N Mother has hx of depression  Substance Abuse Y N Mother has hx of opioid dependency    V  SOCIAL WORK ASSESSMENT Acknowledged order for social work consult to assess mother's hx of depression and substance abuse.   Met both parents.  They were pleasant and receptive to social work intervention.  Mother was very open about her SA history. Informed that she has been taking pain medications since age 24.  She reports hx of interstitial cystitis.  Informed that she was taking the medications as prescribed, but then started abusing oxycodone.  She denies abusing opiates with last  pregnancy.   Mother states that she had tried using suboxone in the past but found that she continued to crave the opioid and continue to abuse the opiates.  Informed that she was started on methadone during this pregnancy and this drug works well for her.   She communicate desire to eventually wean off the methadone.  Mother states that she was very proud of how well she is doing post c-section and is careful to ask for pain medication only when absolutely needed.  She admits to taking a "couple of hit off a joint during the beginning of pregnancy".   No other illicit drug use noted.  UDS on mother and baby were negative.   FOB seems very supportive of mother and aware of her history. He is also the father of the 29 year old.  Mother states that she is in treatment at [facility] and participates in the various classes offered and therapy.  She communicates motivation to eventually live a drug free lifestyle. Encouraged her to continue working her program.   Mother also reports hx of depression and states that she was on Prozac at age 24 and then switched to wellbutrin.  Informed that she had a negative reaction to the Wellbutrin and had to be hospitalized and weaned off the medication.  She denies current symptoms of depression or anxiety.   She reports hx of [employer] involvement because of the opiate abuse, but states that case was closed  and M. Maisie Fus was the case Insurance underwriter.    Mother communicates regret about how her choices have affected newborn.  Allowed her to talk about her feelings.  She also spoke of how difficult the death of her mother has been.  Maternal grandmother died 2012-05-08.  Informed that she and her mother were very close, and she also had a great relationship with her daughter. Provided supportive feedback.  Informed that she did not receive bereavement counsel, but has spoken with her counselors about her grief.   CSW will follow PRN.      VI SOCIAL WORK PLAN Social Work Plan  No Further  Intervention Required / No Barriers to Discharge   Type of pt/family education:   PP Depression information and resources  Hospital's drug screen policy   If child protective services report - county:   If child protective services report - date:   Information/referral to community resources comment:   Other social work plan:   Will continue to monitor drug screen

## 2014-05-07 NOTE — Lactation Note (Signed)
This note was copied from the chart of Victoria Alvarez. Lactation Consultation Note Follow up visit made.  Mom holding baby.  Baby crying, cueing and tremors noted in extremities.  Assisted mom with positioning baby in football hold on left breast.  Colostrum easily hand expressed by mom.  20 mm nipple shield applied and baby latched and nursed off and on for 6 minutes but pulled off frequently crying.  Recommended FOB give baby 10 mls of formula.  Baby calmed after feeding.  Pacifier given to use as needed for soothing.  Assisted mom with pumping.  Instructed to pump every 3 hours x 15 minutes to induce lactation.  Patient Name: Victoria Alvarez [id number] Date: 05/07/2014 Reason for consult: Follow-up assessment   Maternal Data    Feeding Feeding Type: Breast Fed Length of feed: 6 min  LATCH Score/Interventions Latch: Repeated attempts needed to sustain latch, nipple held in mouth throughout feeding, stimulation needed to elicit sucking reflex. Intervention(s): Adjust position;Assist with latch;Breast massage;Breast compression  Audible Swallowing: A few with stimulation Intervention(s): Hand expression;Alternate breast massage  Type of Nipple: Everted at rest and after stimulation  Comfort (Breast/Nipple): Soft / non-tender     Hold (Positioning): Assistance needed to correctly position infant at breast and maintain latch. Intervention(s): Breastfeeding basics reviewed;Support Pillows;Position options;Skin to skin  LATCH Score: 7  Lactation Tools Discussed/Used     Consult Status Consult Status: Follow-up Date: 05/07/14 Follow-up type: In-patient    [name] 05/07/2014, 10:14 AM

## 2014-05-08 ENCOUNTER — Other Ambulatory Visit: Payer: Self-pay | Admitting: Obstetrics & Gynecology

## 2014-05-08 LAB — CBC
HCT: 27.6 % — ABNORMAL LOW (ref 36.0–46.0)
Hemoglobin: 9.3 g/dL — ABNORMAL LOW (ref 12.0–15.0)
MCH: 32.5 pg (ref 26.0–34.0)
MCHC: 33.7 g/dL (ref 30.0–36.0)
MCV: 96.5 fL (ref 78.0–100.0)
Platelets: 210 10*3/uL (ref 150–400)
RBC: 2.86 MIL/uL — ABNORMAL LOW (ref 3.87–5.11)
RDW: 12.7 % (ref 11.5–15.5)
WBC: 8.7 10*3/uL (ref 4.0–10.5)

## 2014-05-08 LAB — RH IG WORKUP (INCLUDES ABO/RH)
ABO/RH(D): O NEG
Fetal Screen: NEGATIVE
Gestational Age(Wks): 39.4
Unit division: 0

## 2014-05-08 LAB — BIRTH TISSUE RECOVERY COLLECTION (PLACENTA DONATION)

## 2014-05-08 MED ORDER — METHADONE HCL 10 MG/ML PO CONC
64.0000 mg | Freq: Every day | ORAL | Status: DC
  Administered 2014-05-08: 64 mg via ORAL
  Filled 2014-05-08: qty 6.4

## 2014-05-08 MED ORDER — ONDANSETRON HCL 4 MG PO TABS: 4.0000 mg | ORAL_TABLET | ORAL | Status: DC | PRN

## 2014-05-08 MED ORDER — IBUPROFEN 600 MG PO TABS: 600.0000 mg | ORAL_TABLET | Freq: Four times a day (QID) | ORAL | Status: DC

## 2014-05-08 MED ORDER — METHADONE HCL 10 MG/ML PO CONC: 64.0000 mg | Freq: Every day | ORAL | Status: DC

## 2014-05-08 MED ORDER — TRAMADOL HCL 50 MG PO TABS: 100.0000 mg | ORAL_TABLET | Freq: Four times a day (QID) | ORAL | Status: DC | PRN

## 2014-05-08 MED ORDER — FERROUS SULFATE 325 (65 FE) MG PO TABS: 325.0000 mg | ORAL_TABLET | Freq: Two times a day (BID) | ORAL | Status: DC

## 2014-05-08 NOTE — Discharge Instructions (Signed)

## 2014-05-08 NOTE — Progress Notes (Signed)
Patient d/c with no limited pain management.  Tramadol 100 mg faxed to [facility].   Patient call [facility]"  See their documentation for more detail.  Continue Methadone.

## 2014-05-08 NOTE — Discharge Summary (Signed)
Obstetric Discharge Summary Reason for Admission: cesarean section Prenatal Procedures: NST  Intrapartum Procedures: cesarean: low cervical, transverse Postpartum Procedures: none Complications-Operative and Postpartum: none HEMOGLOBIN  Date Value Ref Range Status  05/07/2014 8.4* 12.0 - 15.0 g/dL Final    Comment:    DELTA CHECK NOTED REPEATED TO VERIFY    HCT  Date Value Ref Range Status  05/07/2014 24.2* 36.0 - 46.0 % Final    Physical Exam:  General: alert, cooperative and no distress Lochia: appropriate Uterine Fundus: firm Incision: no significant drainage, no significant erythema DVT Evaluation: No evidence of DVT seen on physical exam. No cords or calf tenderness. No significant calf/ankle edema.  Discharge Diagnoses: Term pregnancy, delivered  Discharge Information: Date: 05/08/2014 Activity: pelvic rest Diet: routine Medications: PNV, Ibuprofen and methadone Condition: stable Instructions: refer to practice specific booklet Discharge to: home  BC: Depo Follow-up Information    Follow up with [facility]. Schedule an appointment as soon as possible for a visit in 2 weeks.   Contact information:   [address] [place] [address] [phone]      Newborn Data: Live born female  Birth Weight: 6 lb 8.2 oz (2955 g) APGAR: 8, 9  Admitted to NICU. Mom pumping.  Beverely Low 05/08/2014, 7:49 AM   I spoke with and examined patient and agree with resident/PA/SNM's note and plan of care.  PLTCS for breech. Eating, drinking, voiding, ambulating well.  +flatus.  Lochia and pain wnl.  Denies dizziness, lightheadedness, or sob. No complaints. Hgb 8.4, will add Fe.  Pt POD#2, requests early d/c despite infant NICU admission- states she feels she will be able to see baby more if she is not a patient, and she also has a 25yo at home that she needs to take care of.  Abstinence until depo Cheral Marker, CNM, [facility] 05/08/2014 9:08  AM

## 2014-05-09 ENCOUNTER — Encounter (HOSPITAL_COMMUNITY): Payer: Self-pay | Admitting: *Deleted

## 2014-05-09 ENCOUNTER — Inpatient Hospital Stay (HOSPITAL_COMMUNITY)
Admission: AD | Admit: 2014-05-09 | Discharge: 2014-05-09 | Disposition: A | Discharge status: Home / Self Care | Payer: Medicaid Other | Source: Ambulatory Visit | Attending: Obstetrics and Gynecology | Admitting: Obstetrics and Gynecology

## 2014-05-09 ENCOUNTER — Encounter: Payer: Medicaid Other | Admitting: Obstetrics & Gynecology

## 2014-05-09 DIAGNOSIS — O9089 Other complications of the puerperium, not elsewhere classified: Secondary | ICD-10-CM | POA: Insufficient documentation

## 2014-05-09 DIAGNOSIS — F17210 Nicotine dependence, cigarettes, uncomplicated: Secondary | ICD-10-CM | POA: Insufficient documentation

## 2014-05-09 DIAGNOSIS — O99335 Smoking (tobacco) complicating the puerperium: Secondary | ICD-10-CM | POA: Insufficient documentation

## 2014-05-09 DIAGNOSIS — G8918 Other acute postprocedural pain: Secondary | ICD-10-CM | POA: Diagnosis not present

## 2014-05-09 DIAGNOSIS — R600 Localized edema: Secondary | ICD-10-CM | POA: Diagnosis present

## 2014-05-09 LAB — URINALYSIS, ROUTINE W REFLEX MICROSCOPIC
Bilirubin Urine: NEGATIVE
Glucose, UA: NEGATIVE mg/dL
Ketones, ur: NEGATIVE mg/dL
Nitrite: NEGATIVE
Protein, ur: NEGATIVE mg/dL
Specific Gravity, Urine: 1.01 (ref 1.005–1.030)
Urobilinogen, UA: 0.2 mg/dL (ref 0.0–1.0)
pH: 6 (ref 5.0–8.0)

## 2014-05-09 LAB — URINE MICROSCOPIC-ADD ON

## 2014-05-09 MED ORDER — OXYCODONE-ACETAMINOPHEN 5-325 MG PO TABS
2.0000 | ORAL_TABLET | Freq: Once | ORAL | Status: AC
  Administered 2014-05-09: 2 via ORAL
  Filled 2014-05-09: qty 2

## 2014-05-09 NOTE — Progress Notes (Signed)
Ur chart review completed.  

## 2014-05-09 NOTE — MAU Note (Addendum)
Patient delivered by C/S 2/26 due to breech. States she has been having a lot of pain. Taking Ibuprofen and was called in Ultram. States she is having difficulty having a bowel movement. States she feels sharp cutting pain in incision area. C/O burning with urination. Unable to void for urine sample at his time. C/O increased swelling in feet, legs and hands.

## 2014-05-09 NOTE — Progress Notes (Signed)
Mild to mod edema noted in feet, legs and hands. DTR's 3+, clonus 1 beat. Incision still covered with mesh. Appears to have some drainage, but area surrounding incision appears to be healing well. Bowel sound + X 4 quads.

## 2014-05-09 NOTE — MAU Provider Note (Signed)
History     CSN: [id number]  Arrival date and time: 05/09/14 1348   First Provider Initiated Contact with Patient 05/09/14 1450      Chief Complaint  Patient presents with  . Post-op Problem  . Leg Swelling   HPI   [name] is a 24 y.o. female [id number], Status post primary cesarean section 2/26. She went home yesterday morning. She was visiting her baby in NICU today and saw a Dr. Claiborne Billings she saw in the clinic during her pregnancy. She mentioned her concern for increased swelling in her feet and HA and the Dr. Recommended she be seen in MAU.  The patient is also concerned because she does not feel like her pain is well managed at home with Ultram and ibuprofen. The patient has a history of methadone use in pregnancy. The patient is hoping to get an RX for percocet to help manage her pain at home.    She currently has a HA and rates her pain 4/10    OB History    Gravida Para Term Preterm AB TAB SAB Ectopic Multiple Living   3 2 2  0 1 0 1 0 0 2      Past Medical History  Diagnosis Date  . Interstitial cystitis   . Chronic pelvic pain in female   . Headache(784.0)   . Infection     UTI  . Pyelonephritis affecting pregnancy in first trimester July 2015  . Depression 2007  . Substance abuse     Past Surgical History  Procedure Laterality Date  . Hernia repair    . Vagina surgery      tumor removed, went to cancer center in [place]    Family History  Problem Relation Age of Onset  . Diabetes Mother   . Heart disease Mother   . Hypertension Father   . Diabetes Maternal Grandmother   . Hypertension Maternal Grandmother   . Stroke Maternal Grandmother   . Diabetes Maternal Grandfather   . Hypertension Maternal Grandfather   . Diabetes Paternal Grandmother   . Diabetes Paternal Grandfather     History  Substance Use Topics  . Smoking status: Current Every Day Smoker -- 0.50 packs/day for 8 years    Types: Cigarettes  . Smokeless tobacco: Not on file   Comment: cutting back  . Alcohol Use: No    Allergies:  Allergies  Allergen Reactions  . Ciprofloxacin Diarrhea and Nausea And Vomiting    Prescriptions prior to admission  Medication Sig Dispense Refill Last Dose  . albuterol (PROVENTIL HFA;VENTOLIN HFA) 108 (90 BASE) MCG/ACT inhaler Inhale 2 puffs into the lungs every 6 (six) hours as needed for wheezing or shortness of breath. 1 Inhaler 2 Past Month at Unknown time  . ferrous sulfate 325 (65 FE) MG tablet Take 1 tablet (325 mg total) by mouth 2 (two) times daily with a meal. 60 tablet 3   . ibuprofen (ADVIL,MOTRIN) 600 MG tablet Take 1 tablet (600 mg total) by mouth every 6 (six) hours. 30 tablet 0   . methadone (DOLOPHINE) 10 MG/ML solution Take 64 mg by mouth daily.    05/06/2014 at Unknown time  . ondansetron (ZOFRAN ODT) 8 MG disintegrating tablet Take 1 tablet (8 mg total) by mouth every 8 (eight) hours as needed for nausea or vomiting. 20 tablet 0 05/06/2014 at Unknown time  . ondansetron (ZOFRAN) 4 MG tablet Take 1 tablet (4 mg total) by mouth every 4 (four) hours as needed for nausea  or vomiting. 20 tablet 0   . oxybutynin (DITROPAN) 5 MG tablet Take 1 tablet (5 mg total) by mouth 2 (two) times daily. 60 tablet 3 05/05/2014 at Unknown time  . Prenatal Vit-Fe Fumarate-FA (PRENATAL MULTIVITAMIN) TABS tablet Take 1 tablet by mouth daily at 12 noon. 30 tablet 3 05/05/2014 at Unknown time  . traMADol (ULTRAM) 50 MG tablet Take 2 tablets (100 mg total) by mouth every 6 (six) hours as needed. 30 tablet 0    Results for orders placed or performed during the hospital encounter of 05/09/14 (from the past 48 hour(s))  Urinalysis, Routine w reflex microscopic     Status: Abnormal   Collection Time: 05/09/14  3:00 PM  Result Value Ref Range   Color, Urine YELLOW YELLOW   APPearance HAZY (A) CLEAR   Specific Gravity, Urine 1.010 1.005 - 1.030   pH 6.0 5.0 - 8.0   Glucose, UA NEGATIVE NEGATIVE mg/dL   Hgb urine dipstick LARGE (A) NEGATIVE    Bilirubin Urine NEGATIVE NEGATIVE   Ketones, ur NEGATIVE NEGATIVE mg/dL   Protein, ur NEGATIVE NEGATIVE mg/dL   Urobilinogen, UA 0.2 0.0 - 1.0 mg/dL   Nitrite NEGATIVE NEGATIVE   Leukocytes, UA MODERATE (A) NEGATIVE  Urine microscopic-add on     Status: Abnormal   Collection Time: 05/09/14  3:00 PM  Result Value Ref Range   Squamous Epithelial / LPF MANY (A) RARE   WBC, UA 3-6 <3 WBC/hpf   RBC / HPF 7-10 <3 RBC/hpf   Bacteria, UA FEW (A) RARE    Review of Systems  Constitutional: Negative for fever and chills.  Cardiovascular: Positive for leg swelling.  Gastrointestinal: Positive for abdominal pain (Tenderness at incision site ).  Genitourinary: Negative for dysuria, urgency and frequency.  Neurological: Positive for headaches.   Physical Exam   Blood pressure 128/73, pulse 77, temperature 99 F (37.2 C), temperature source Oral, resp. rate 18, height  (1.6 m), weight 83.008 kg (183 lb), unknown if currently breastfeeding.   Today's Vitals   05/09/14 1500 05/09/14 1517 05/09/14 1613 05/09/14 1614  BP: 126/73  141/82   Pulse: 73  82   Temp:      TempSrc:      Resp:      Height:      Weight:      PainSc:  7   3     Physical Exam  Constitutional: She is oriented to person, place, and time. She appears well-developed and well-nourished. No distress.  HENT:  Head: Normocephalic.  Eyes: Pupils are equal, round, and reactive to light.  Neck: Neck supple.  Cardiovascular: Normal rate and normal heart sounds.   Respiratory: Effort normal and breath sounds normal.  GI: Soft. Normal appearance.    Musculoskeletal: Normal range of motion.       Right ankle: She exhibits swelling (Non pitting ).       Left ankle: She exhibits swelling (non pitting ).  Neurological: She is alert and oriented to person, place, and time. She displays abnormal reflex (mild hyperreflexia ).  Negative clonus   Skin: Skin is warm. She is not diaphoretic.  Psychiatric: Her behavior is normal.     MAU Course  Procedures  None  MDM UA  Percocet 2 tabs given in MAU Serial BP readings Patient states her pain is a 2/10 at the time of discharge. She is ready to go home Discussed patient with Dr. Jolayne Panther   Assessment and Plan   A:  1. Pain following surgery or procedure    P:  Discharge home in stable condition  Follow up with OB as scheduled Continue to take Ibuprofen as needed for pain Return to MAU with increased HA pain that is not relieved by medication   Victoria Hansen Gaylon Bentz, NP 05/09/2014 4:29 PM

## 2014-05-10 ENCOUNTER — Ambulatory Visit: Payer: Self-pay

## 2014-05-10 ENCOUNTER — Encounter (HOSPITAL_COMMUNITY): Payer: Self-pay | Admitting: Family Medicine

## 2014-05-10 DIAGNOSIS — I639 Cerebral infarction, unspecified: Secondary | ICD-10-CM

## 2014-05-10 HISTORY — DX: Cerebral infarction, unspecified: I63.9

## 2014-05-10 NOTE — Lactation Note (Signed)
This note was copied from the chart of Victoria Alvarez. Lactation Consultation Note  Patient Name: Victoria Shiquita Henrie Today's Date: 05/10/2014 Reason for consult: Follow-up assessment NICU baby, 93 hours of life. Called for NS and pumping kit. Mom states that she has an appointment with [facility] for the next day, 05-11-14 for a DEBP. Mom states that she needs a DEBP kit in order to use NICU pump when she visits baby. Mom given kit and #20 NS. Mom is feeding baby ad lib, baby fussy and cueing to feed, so assisted mom to latch baby to right breast in cross-cradle position using #20 NS. Baby very fussy and mom's breasts are full. Mom's milk is coming in. Mom states that she will give baby a bottle for now. Enc mom to pump after baby eats, and then to keep pumping every 3 hours. Discussed with mom that she needs to hand express or pump to soften breast some before putting baby to breast because this will make the latch easier. Enc mom to call for LC assistance as needed. Parents enc to call for assistance with latching as needed.   Maternal Data    Feeding Feeding Type: Breast Fed Nipple Type: Slow - flow Length of feed: 0 min  LATCH Score/Interventions Latch: Repeated attempts needed to sustain latch, nipple held in mouth throughout feeding, stimulation needed to elicit sucking reflex. Intervention(s): Skin to skin Intervention(s): Adjust position;Assist with latch;Breast compression  Audible Swallowing: None Intervention(s): Skin to skin;Hand expression  Type of Nipple: Everted at rest and after stimulation  Comfort (Breast/Nipple): Soft / non-tender     Hold (Positioning): Assistance needed to correctly position infant at breast and maintain latch. Intervention(s): Breastfeeding basics reviewed;Support Pillows;Position options;Skin to skin  LATCH Score: 6  Lactation Tools Discussed/Used Tools: Nipple Shields;Pump Nipple shield size: 20 Breast pump type: Manual WIC Program:  Yes   Consult Status Consult Status: PRN    ,  05/10/2014, 2:35 PM    

## 2014-05-11 ENCOUNTER — Ambulatory Visit: Payer: Self-pay

## 2014-05-11 ENCOUNTER — Telehealth: Payer: Self-pay | Admitting: *Deleted

## 2014-05-11 MED ORDER — ONDANSETRON 8 MG PO TBDP: 8.0000 mg | ORAL_TABLET | Freq: Three times a day (TID) | ORAL | Status: DC | PRN

## 2014-05-11 NOTE — Lactation Note (Addendum)
This note was copied from the chart of Boy Malloree Smisek. Lactation Consultation Note  Patient Name: Boy Evelyne Picker [id number] Date: 05/11/2014 Reason for consult: Follow-up assessment;NICU baby NICU baby 5 days of life. Called to bedside to assist with pumping. Mom's milk is coming in and mom states her left nipple is not comfortable while pumping. Assisted mom to pump and fitted her with a #27 flange. Also enc mom to use EBM on nipple to reduce friction. Mom reports increased comfort. Mom's milk flowing well from both breast. Enc mom to massage breasts, use DEBP, and massage and hand express after pumping. Mom reports increased comfort of breasts today. Mom states that she attempted to nurse earlier but baby fussy at breast. Discussed using hand pump at bedside prior to latching in order to ever nipples and have milk flowing before baby latches. Also discussed ways of progressing baby fully to breast. Enc mom to continue pumping every 3 hours in order to have a good supply. Mom states that she hasn't been pumping regularly, but now that her milk is coming in, she is pumping routinely because it is painful if she doesn't. Enc mom to ask for assistance as needed. Mom given comfort gels with instructions and enc to use EBM on nipples after pumping.   Maternal Data    Feeding Feeding Type: Formula Nipple Type: Slow - flow Length of feed: 30 min (father fed after mother offered breast)  LATCH Score/Interventions Latch: Repeated attempts needed to sustain latch, nipple held in mouth throughout feeding, stimulation needed to elicit sucking reflex. Intervention(s): Skin to skin Intervention(s): Breast compression  Audible Swallowing: None Intervention(s): Skin to skin  Type of Nipple:  (nipple shield)  Comfort (Breast/Nipple): Soft / non-tender     Hold (Positioning): No assistance needed to correctly position infant at breast.     Lactation Tools Discussed/Used     Consult  Status Consult Status: PRN    Geralynn Ochs 05/11/2014, 12:02 PM

## 2014-05-11 NOTE — Telephone Encounter (Signed)
Hortense called and left a message requesting a call back.  Called Niyah and she reports she thinks she has an appointment tomorrow . She states she went to MAU  A few days ago for swelling and a little headache . States they checked her out and she was ok, but states now her swelling is much worse, c/o bad headache =7, and visual changes - seeing spots.Delivered 05/06/14 . Instructed her to come to MAU today as soon as possible for evaluation as it could be preeclampsia. She states she had preeclampsia with her daughter 3 years ago. We discussed she does not have an appointment tomorrow - she had an ob visit that was cancelled since she is not pregnant now- gave her date for postpartum visit in April. She states she doesn't really want to come to MAU. We discussed it is very important to be evaluated as soon as possible as preeclampsia can occur postpartum and is very dangerous and could be life - threatening. She states she will find someone to watch her 25 year old-then come to MAU for evaluation. Encouraged her to have someone drive her to MAU that she felt safe watching her 25 year old while she is being evaluated.

## 2014-05-11 NOTE — Lactation Note (Signed)
This note was copied from the chart of [place]. Lactation Consultation Note  Patient Name: Boy Joell Buerger [id number] Date: 05/11/2014 Reason for consult: Follow-up assessment;NICU baby NICU baby, 5 days of life. Mom in NICU states that she put baby to breast and he nurse without NS. Mom concerned because she pumped afterwards and pumped less EBM than she obtained with LC earlier this morning. Patient's nurse states that baby probably transferred the milk while nursing. Mom states that she is very tired and hungry and about to leave the hospital. Enc mom to take care of herself, and to especially eat and rest well. Discussed adding an hour of "power-pumping" each evening of alternating 10 minutes of using pump, and 10 minutes of resting. Also enc mom to keep up her pumping schedule that was discussed this morning with massage and hand expression as well. Mom aware [place] assistance will be available tomorrow.   Maternal Data    Feeding Feeding Type: Breast Milk with Formula added Nipple Type: Slow - flow  LATCH Score/Interventions Latch: Grasps breast easily, tongue down, lips flanged, rhythmical sucking. Intervention(s): Skin to skin Intervention(s): Breast compression  Audible Swallowing: A few with stimulation Intervention(s): Skin to skin Intervention(s): Skin to skin;Hand expression  Type of Nipple: Everted at rest and after stimulation  Comfort (Breast/Nipple): Soft / non-tender     Hold (Positioning): No assistance needed to correctly position infant at breast. (mother met w Lactation consultant [phone] for assistance) Intervention(s): Support Pillows  LATCH Score: 9  Lactation Tools Discussed/Used     Consult Status Consult Status: PRN    Inocente Salles 05/11/2014, 4:06 PM

## 2014-05-12 ENCOUNTER — Encounter: Payer: Medicaid Other | Admitting: Family Medicine

## 2014-05-16 ENCOUNTER — Ambulatory Visit: Payer: Self-pay

## 2014-05-16 ENCOUNTER — Encounter (HOSPITAL_COMMUNITY): Payer: Self-pay | Admitting: *Deleted

## 2014-05-16 ENCOUNTER — Inpatient Hospital Stay (EMERGENCY_DEPARTMENT_HOSPITAL)
Admission: AD | Admit: 2014-05-16 | Discharge: 2014-05-17 | Disposition: A | Discharge status: Home / Self Care | Payer: Medicaid Other | Source: Ambulatory Visit | Attending: Obstetrics & Gynecology | Admitting: Obstetrics & Gynecology

## 2014-05-16 DIAGNOSIS — G43009 Migraine without aura, not intractable, without status migrainosus: Secondary | ICD-10-CM

## 2014-05-16 DIAGNOSIS — R200 Anesthesia of skin: Secondary | ICD-10-CM

## 2014-05-16 DIAGNOSIS — R202 Paresthesia of skin: Secondary | ICD-10-CM

## 2014-05-16 LAB — COMPREHENSIVE METABOLIC PANEL
ALT: 16 U/L (ref 0–35)
AST: 16 U/L (ref 0–37)
Albumin: 3.2 g/dL — ABNORMAL LOW (ref 3.5–5.2)
Alkaline Phosphatase: 105 U/L (ref 39–117)
Anion gap: 9 (ref 5–15)
BUN: 13 mg/dL (ref 6–23)
CO2: 26 mmol/L (ref 19–32)
Calcium: 9.1 mg/dL (ref 8.4–10.5)
Chloride: 104 mmol/L (ref 96–112)
Creatinine, Ser: 0.67 mg/dL (ref 0.50–1.10)
GFR calc Af Amer: >90 mL/min (ref >90)
GFR calc non Af Amer: >90 mL/min (ref >90)
Glucose, Bld: 104 mg/dL — ABNORMAL HIGH (ref 70–99)
Potassium: 3.2 mmol/L — ABNORMAL LOW (ref 3.5–5.1)
Sodium: 139 mmol/L (ref 135–145)
Total Bilirubin: 0.7 mg/dL (ref 0.3–1.2)
Total Protein: 6 g/dL (ref 6.0–8.3)

## 2014-05-16 LAB — CBC WITH DIFFERENTIAL/PLATELET
Basophils Absolute: 0 10*3/uL (ref 0.0–0.1)
Basophils Relative: 0 % (ref 0–1)
Eosinophils Absolute: 0.1 10*3/uL (ref 0.0–0.7)
Eosinophils Relative: 1 % (ref 0–5)
HCT: 28.9 % — ABNORMAL LOW (ref 36.0–46.0)
Hemoglobin: 9.7 g/dL — ABNORMAL LOW (ref 12.0–15.0)
Lymphocytes Relative: 38 % (ref 12–46)
Lymphs Abs: 3 10*3/uL (ref 0.7–4.0)
MCH: 31 pg (ref 26.0–34.0)
MCHC: 33.6 g/dL (ref 30.0–36.0)
MCV: 92.3 fL (ref 78.0–100.0)
Monocytes Absolute: 0.5 10*3/uL (ref 0.1–1.0)
Monocytes Relative: 6 % (ref 3–12)
Neutro Abs: 4.2 10*3/uL (ref 1.7–7.7)
Neutrophils Relative %: 55 % (ref 43–77)
Platelets: 302 10*3/uL (ref 150–400)
RBC: 3.13 MIL/uL — ABNORMAL LOW (ref 3.87–5.11)
RDW: 12.6 % (ref 11.5–15.5)
WBC: 7.8 10*3/uL (ref 4.0–10.5)

## 2014-05-16 LAB — URINE MICROSCOPIC-ADD ON

## 2014-05-16 LAB — URINALYSIS, ROUTINE W REFLEX MICROSCOPIC
Bilirubin Urine: NEGATIVE
Glucose, UA: NEGATIVE mg/dL
Ketones, ur: NEGATIVE mg/dL
Nitrite: NEGATIVE
Protein, ur: NEGATIVE mg/dL
Specific Gravity, Urine: 1.02 (ref 1.005–1.030)
Urobilinogen, UA: 0.2 mg/dL (ref 0.0–1.0)
pH: 5.5 (ref 5.0–8.0)

## 2014-05-16 LAB — PROTEIN / CREATININE RATIO, URINE
Creatinine, Urine: 94 mg/dL
Total Protein, Urine: <6 mg/dL

## 2014-05-16 MED ORDER — DIPHENHYDRAMINE HCL 50 MG/ML IJ SOLN
25.0000 mg | Freq: Once | INTRAMUSCULAR | Status: AC
  Administered 2014-05-16: 25 mg via INTRAVENOUS
  Filled 2014-05-16: qty 1

## 2014-05-16 MED ORDER — DEXAMETHASONE SODIUM PHOSPHATE 10 MG/ML IJ SOLN
10.0000 mg | Freq: Once | INTRAMUSCULAR | Status: AC
  Administered 2014-05-16: 10 mg via INTRAVENOUS
  Filled 2014-05-16: qty 1

## 2014-05-16 MED ORDER — LACTATED RINGERS IV BOLUS (SEPSIS)
1000.0000 mL | Freq: Once | INTRAVENOUS | Status: AC
  Administered 2014-05-16: 1000 mL via INTRAVENOUS

## 2014-05-16 MED ORDER — POTASSIUM CHLORIDE 2 MEQ/ML IV SOLN
INTRAVENOUS | Status: DC
  Administered 2014-05-16: 23:00:00 via INTRAVENOUS
  Filled 2014-05-16 (×10): qty 1000

## 2014-05-16 MED ORDER — METOCLOPRAMIDE HCL 5 MG/ML IJ SOLN
10.0000 mg | Freq: Once | INTRAMUSCULAR | Status: AC
  Administered 2014-05-16: 10 mg via INTRAVENOUS
  Filled 2014-05-16: qty 2

## 2014-05-16 NOTE — Lactation Note (Signed)
This note was copied from the chart of Victoria Alvarez. Lactation Consultation Note follow visit made to assist with latch.  Baby is fussy and mom has been attempting to latch baby past few minutes without success.  Assisted with positioning baby in cross cradle hold.  Baby unable to latch to breast and crying.  20 mm nipple shield applied and milk expressed into shield.  Baby latched well and nursed actively on both breasts for a total of 30 minutes.  Audible swallows noted.  Baby came off breast relaxed.  Mom will offer bottle of expressed milk and then go pump.  She is obtaining 60+ mls from right breast and 15 mls from left breast.  She is using a manual pump at home and states her [facility] 05/19/14 in [facility].  Discussed pump loaner program and mom will let me know if she decides to loan pump from [place].  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  Mom praised for all her efforts to provide breastmilk for her baby.  Plan to meet mom in AM and possibly do a pre and post weight.  Patient Name: Victoria Alvarez [id number] Date: 05/16/2014 Reason for consult: Follow-up assessment;NICU baby   Maternal Data    Feeding Feeding Type: Breast Fed Length of feed: 30 min  LATCH Score/Interventions Latch: Grasps breast easily, tongue down, lips flanged, rhythmical sucking. (WITH 20 MM NIPPLE SHIELD) Intervention(s): Teach feeding cues;Waking techniques Intervention(s): Adjust position;Assist with latch;Breast massage;Breast compression  Audible Swallowing: Spontaneous and intermittent Intervention(s): Hand expression;Alternate breast massage  Type of Nipple: Everted at rest and after stimulation  Comfort (Breast/Nipple): Soft / non-tender     Hold (Positioning): Assistance needed to correctly position infant at breast and maintain latch. Intervention(s): Breastfeeding basics reviewed;Support Pillows  LATCH Score:  9  Lactation Tools Discussed/Used Tools: Nipple Shields Nipple shield size: 20   Consult Status Consult Status: Follow-up Date: 05/17/14 Follow-up type: In-patient    Victoria Alvarez 05/16/2014, 12:26 PM

## 2014-05-16 NOTE — MAU Note (Signed)
Pt s/p C/S on 02/26, states she has had a headache since her delivery and was seen here and eval for preeclampsia and was told it was negative. States today she has had numbness on her right side. States it started with her leg and gradually moves up her body.

## 2014-05-16 NOTE — MAU Provider Note (Signed)
History     CSN: [id number]  Arrival date and time: 05/16/14 2119   First Provider Initiated Contact with Patient 05/16/14 2212      No chief complaint on file.  HPI [name] is a 24 y.o. [id number] who delivered by [facility]CS for breech presentation on 05/06/14 who presents to MAU today with complaint of headache and numbness and tingling of the right side, most prominent in the leg. She was seen here on 05/09/14 for headache and swelling and headache was resolved with Percocet. She states that headache returned yesterday. She states "it is a migraine." She endorses photophobia and phonophobia. She has taken Ibuprofen and Goody Powders ~ 5 hours prior to arrival without relief. She also endorses associated dizziness, floaters and blurred vision. She denies abdominal pain, peripheral edema, or changes in headache with position changes. She states that she has had a tingling feeling with numbness of her right leg from the thigh down. She denies radiation or shooting pains. She states that earlier she was laying down because of headache and noted tingling down her entire right side which has improved some. She states a continued feeling of heaviness in her right foot.   OB History    Gravida Para Term Preterm AB TAB SAB Ectopic Multiple Living   0 1 0 1 0 0 2      Past Medical History  Diagnosis Date  . Interstitial cystitis   . Chronic pelvic pain in female   . Headache(784.0)   . Infection     UTI  . Pyelonephritis affecting pregnancy in first trimester July 2015  . Depression 2007  . Substance abuse     Past Surgical History  Procedure Laterality Date  . Hernia repair    . Vagina surgery      tumor removed, went to cancer center in [facility]  . Cesarean section N/A 05/06/2014    Procedure: CESAREAN SECTION;  Surgeon: Levie Heritage, DO;  Location: [facility];  Service: Obstetrics;  Laterality: N/A;    Family History  Problem Relation Age of Onset  . Diabetes Mother   . Heart  disease Mother   . Hypertension Father   . Diabetes Maternal Grandmother   . Hypertension Maternal Grandmother   . Stroke Maternal Grandmother   . Diabetes Maternal Grandfather   . Hypertension Maternal Grandfather   . Diabetes Paternal Grandmother   . Diabetes Paternal Grandfather     History  Substance Use Topics  . Smoking status: Current Every Day Smoker -- 0.50 packs/day for 8 years    Types: Cigarettes  . Smokeless tobacco: Not on file     Comment: cutting back  . Alcohol Use: No    Allergies:  Allergies  Allergen Reactions  . Ciprofloxacin Diarrhea and Nausea And Vomiting    Prescriptions prior to admission  Medication Sig Dispense Refill Last Dose  . albuterol (PROVENTIL HFA;VENTOLIN HFA) 108 (90 BASE) MCG/ACT inhaler Inhale 2 puffs into the lungs every 6 (six) hours as needed for wheezing or shortness of breath. 1 Inhaler 2 Past Month at Unknown time  . ibuprofen (ADVIL,MOTRIN) 600 MG tablet Take 1 tablet (600 mg total) by mouth every 6 (six) hours. 30 tablet 0 05/16/2014 at Unknown time  . methadone (DOLOPHINE) 10 MG/ML solution Take 64 mg by mouth daily.    05/16/2014 at Unknown time  . ondansetron (ZOFRAN ODT) 8 MG disintegrating tablet Take 1 tablet (8 mg total) by mouth every 8 (eight) hours  as needed for nausea or vomiting. 20 tablet 0 Past Week at Unknown time  . ondansetron (ZOFRAN) 4 MG tablet Take 1 tablet (4 mg total) by mouth every 4 (four) hours as needed for nausea or vomiting. 20 tablet 0 Past Month at Unknown time  . oxybutynin (DITROPAN) 5 MG tablet Take 1 tablet (5 mg total) by mouth 2 (two) times daily. 60 tablet 3 Past Month at Unknown time  . ferrous sulfate 325 (65 FE) MG tablet Take 1 tablet (325 mg total) by mouth 2 (two) times daily with a meal. (Patient not taking: Reported on 05/16/2014) 60 tablet 3   . Prenatal Vit-Fe Fumarate-FA (PRENATAL MULTIVITAMIN) TABS tablet Take 1 tablet by mouth daily at 12 noon. (Patient not taking: Reported on 05/16/2014) 30  tablet 3 05/05/2014 at Unknown time  . traMADol (ULTRAM) 50 MG tablet Take 2 tablets (100 mg total) by mouth every 6 (six) hours as needed. (Patient not taking: Reported on 05/16/2014) 30 tablet 0     Review of Systems  Constitutional: Negative for fever and malaise/fatigue.  HENT:       Photophobia, Phonophobia  Eyes: Positive for blurred vision.       Floaters  Gastrointestinal: Negative for nausea, vomiting and abdominal pain.  Neurological: Positive for headaches.   Physical Exam   Blood pressure 133/75, pulse 89, temperature 99.5 F (37.5 C), temperature source Oral, resp. rate 18, height 5\' 2"  (1.575 m), weight 176 lb (79.833 kg), SpO2 97 %, unknown if currently breastfeeding.  Physical Exam  Constitutional: She is oriented to person, place, and time. She appears well-developed and well-nourished. No distress.  HENT:  Head: Normocephalic.  Cardiovascular: Normal rate, regular rhythm and normal heart sounds.   Respiratory: Effort normal and breath sounds normal. No respiratory distress.  GI: Soft. She exhibits no distension and no mass. There is no tenderness. There is no rebound and no guarding.  Musculoskeletal: Normal range of motion. She exhibits no edema.  Neurological: She is alert and oriented to person, place, and time. She has normal strength.  Reflex Scores:      Bicep reflexes are 3+ on the right side and 3+ on the left side.      Brachioradialis reflexes are 3+ on the right side and 3+ on the left side.      Patellar reflexes are 3+ on the right side and 3+ on the left side.      Achilles reflexes are 2+ on the right side and 2+ on the left side. No clonus, Negative straight leg raise bilaterally  Skin: Skin is warm and dry. No erythema.  Psychiatric: She has a normal mood and affect.   Results for orders placed or performed during the hospital encounter of 05/16/14 (from the past 24 hour(s))  Urinalysis, Routine w reflex microscopic     Status: Abnormal    Collection Time: 05/16/14  9:40 PM  Result Value Ref Range   Color, Urine YELLOW YELLOW   APPearance CLEAR CLEAR   Specific Gravity, Urine 1.020 1.005 - 1.030   pH 5.5 5.0 - 8.0   Glucose, UA NEGATIVE NEGATIVE mg/dL   Hgb urine dipstick MODERATE (A) NEGATIVE   Bilirubin Urine NEGATIVE NEGATIVE   Ketones, ur NEGATIVE NEGATIVE mg/dL   Protein, ur NEGATIVE NEGATIVE mg/dL   Urobilinogen, UA 0.2 0.0 - 1.0 mg/dL   Nitrite NEGATIVE NEGATIVE   Leukocytes, UA SMALL (A) NEGATIVE  Urine microscopic-add on     Status: Abnormal   Collection Time:  05/16/14  9:40 PM  Result Value Ref Range   Squamous Epithelial / LPF FEW (A) RARE   WBC, UA 7-10 <3 WBC/hpf   RBC / HPF 0-2 <3 RBC/hpf   Bacteria, UA FEW (A) RARE   Urine-Other MUCOUS PRESENT   CBC with Differential/Platelet     Status: Abnormal   Collection Time: 05/16/14 10:25 PM  Result Value Ref Range   WBC 7.8 4.0 - 10.5 K/uL   RBC 3.13 (L) 3.87 - 5.11 MIL/uL   Hemoglobin 9.7 (L) 12.0 - 15.0 g/dL   HCT 60.4 (L) 54.0 - 98.1 %   MCV 92.3 78.0 - 100.0 fL   MCH 31.0 26.0 - 34.0 pg   MCHC 33.6 30.0 - 36.0 g/dL   RDW 19.1 47.8 - 29.5 %   Platelets 302 150 - 400 K/uL   Neutrophils Relative % 55 43 - 77 %   Neutro Abs 4.2 1.7 - 7.7 K/uL   Lymphocytes Relative 38 12 - 46 %   Lymphs Abs 3.0 0.7 - 4.0 K/uL   Monocytes Relative 6 3 - 12 %   Monocytes Absolute 0.5 0.1 - 1.0 K/uL   Eosinophils Relative 1 0 - 5 %   Eosinophils Absolute 0.1 0.0 - 0.7 K/uL   Basophils Relative 0 0 - 1 %   Basophils Absolute 0.0 0.0 - 0.1 K/uL  Comprehensive metabolic panel     Status: Abnormal   Collection Time: 05/16/14 10:25 PM  Result Value Ref Range   Sodium 139 135 - 145 mmol/L   Potassium 3.2 (L) 3.5 - 5.1 mmol/L   Chloride 104 96 - 112 mmol/L   CO2 26 19 - 32 mmol/L   Glucose, Bld 104 (H) 70 - 99 mg/dL   BUN 13 6 - 23 mg/dL   Creatinine, Ser 6.21 0.50 - 1.10 mg/dL   Calcium 9.1 8.4 - 30.8 mg/dL   Total Protein 6.0 6.0 - 8.3 g/dL   Albumin 3.2 (L) 3.5 -  5.2 g/dL   AST 16 0 - 37 U/L   ALT 16 0 - 35 U/L   Alkaline Phosphatase 105 39 - 117 U/L   Total Bilirubin 0.7 0.3 - 1.2 mg/dL   GFR calc non Af Amer >90 >90 mL/min   GFR calc Af Amer >90 >90 mL/min   Anion gap 9 5 - 15  Protein / creatinine ratio, urine     Status: None   Collection Time: 05/16/14 11:02 PM  Result Value Ref Range   Creatinine, Urine 94.00 mg/dL   Total Protein, Urine <6 mg/dL   Protein Creatinine Ratio        0.00 - 0.15    MAU Course  Procedures None  MDM CBC, CMP, Urine Protein/Creatinine ratio IV LR with 25 mg Phenergan, 25 mg Benadryl and 10 mg Decadron Discussed patient with Dr. Debroah Loop. Agrees with plan for work-up. Treat headache while in MAU.  K+ was low on CMP. Second liter of IV fluids containing 10 mEq of K+ given.  Patient reports headache is now 4/10 or better after treatment. She states that she desires discharge home.  Assessment and Plan  A: Postpartum Headache Numbness and tingling of right leg  P: Discharge home Rx for Fioricet given to patient Discussed warning signs for worsening condition Encouraged increased PO hydration, appropriate nutrition and rest Patient advised to contact [facility] for appointment date/time for follow-up already scheduled Patient may return to MAU as needed or if her condition were to change or worsen  Marny Lowenstein, PA-C  05/17/2014, 1:33 AM

## 2014-05-17 ENCOUNTER — Encounter (HOSPITAL_COMMUNITY): Payer: Self-pay | Admitting: Emergency Medicine

## 2014-05-17 ENCOUNTER — Inpatient Hospital Stay (HOSPITAL_COMMUNITY): Payer: Medicaid Other

## 2014-05-17 ENCOUNTER — Inpatient Hospital Stay (HOSPITAL_COMMUNITY)
Admission: AD | Admit: 2014-05-17 | Discharge: 2014-05-19 | DRG: 776 | Disposition: A | Discharge status: Home Health | Payer: Medicaid Other | Source: Ambulatory Visit | Attending: Internal Medicine | Admitting: Internal Medicine

## 2014-05-17 DIAGNOSIS — J45901 Unspecified asthma with (acute) exacerbation: Secondary | ICD-10-CM | POA: Insufficient documentation

## 2014-05-17 DIAGNOSIS — G43909 Migraine, unspecified, not intractable, without status migrainosus: Secondary | ICD-10-CM | POA: Diagnosis present

## 2014-05-17 DIAGNOSIS — D509 Iron deficiency anemia, unspecified: Secondary | ICD-10-CM | POA: Diagnosis present

## 2014-05-17 DIAGNOSIS — R29898 Other symptoms and signs involving the musculoskeletal system: Secondary | ICD-10-CM

## 2014-05-17 DIAGNOSIS — J45909 Unspecified asthma, uncomplicated: Secondary | ICD-10-CM | POA: Diagnosis present

## 2014-05-17 DIAGNOSIS — O99325 Drug use complicating the puerperium: Secondary | ICD-10-CM | POA: Diagnosis present

## 2014-05-17 DIAGNOSIS — M6289 Other specified disorders of muscle: Secondary | ICD-10-CM

## 2014-05-17 DIAGNOSIS — G8190 Hemiplegia, unspecified affecting unspecified side: Secondary | ICD-10-CM

## 2014-05-17 DIAGNOSIS — Z6830 Body mass index (BMI) 30.0-30.9, adult: Secondary | ICD-10-CM

## 2014-05-17 DIAGNOSIS — F1120 Opioid dependence, uncomplicated: Secondary | ICD-10-CM | POA: Diagnosis present

## 2014-05-17 DIAGNOSIS — I636 Cerebral infarction due to cerebral venous thrombosis, nonpyogenic: Secondary | ICD-10-CM | POA: Insufficient documentation

## 2014-05-17 DIAGNOSIS — G43009 Migraine without aura, not intractable, without status migrainosus: Secondary | ICD-10-CM

## 2014-05-17 DIAGNOSIS — I6330 Cerebral infarction due to thrombosis of unspecified cerebral artery: Secondary | ICD-10-CM | POA: Insufficient documentation

## 2014-05-17 DIAGNOSIS — R531 Weakness: Secondary | ICD-10-CM

## 2014-05-17 DIAGNOSIS — F17210 Nicotine dependence, cigarettes, uncomplicated: Secondary | ICD-10-CM | POA: Diagnosis present

## 2014-05-17 DIAGNOSIS — G8191 Hemiplegia, unspecified affecting right dominant side: Secondary | ICD-10-CM | POA: Diagnosis present

## 2014-05-17 DIAGNOSIS — G08 Intracranial and intraspinal phlebitis and thrombophlebitis: Secondary | ICD-10-CM | POA: Insufficient documentation

## 2014-05-17 DIAGNOSIS — O873 Cerebral venous thrombosis in the puerperium: Principal | ICD-10-CM | POA: Diagnosis present

## 2014-05-17 DIAGNOSIS — Z720 Tobacco use: Secondary | ICD-10-CM | POA: Insufficient documentation

## 2014-05-17 DIAGNOSIS — E663 Overweight: Secondary | ICD-10-CM | POA: Diagnosis present

## 2014-05-17 HISTORY — DX: Pneumonia, unspecified organism: J18.9

## 2014-05-17 LAB — CBC WITH DIFFERENTIAL/PLATELET
Basophils Absolute: 0 10*3/uL (ref 0.0–0.1)
Basophils Relative: 0 % (ref 0–1)
Eosinophils Absolute: 0.1 10*3/uL (ref 0.0–0.7)
Eosinophils Relative: 1 % (ref 0–5)
HCT: 28.5 % — ABNORMAL LOW (ref 36.0–46.0)
Hemoglobin: 9.6 g/dL — ABNORMAL LOW (ref 12.0–15.0)
Lymphocytes Relative: 30 % (ref 12–46)
Lymphs Abs: 3.1 10*3/uL (ref 0.7–4.0)
MCH: 30.8 pg (ref 26.0–34.0)
MCHC: 33.7 g/dL (ref 30.0–36.0)
MCV: 91.3 fL (ref 78.0–100.0)
Monocytes Absolute: 0.6 10*3/uL (ref 0.1–1.0)
Monocytes Relative: 5 % (ref 3–12)
Neutro Abs: 6.6 10*3/uL (ref 1.7–7.7)
Neutrophils Relative %: 64 % (ref 43–77)
Platelets: 325 10*3/uL (ref 150–400)
RBC: 3.12 MIL/uL — ABNORMAL LOW (ref 3.87–5.11)
RDW: 12.7 % (ref 11.5–15.5)
WBC: 10.3 10*3/uL (ref 4.0–10.5)

## 2014-05-17 LAB — COMPREHENSIVE METABOLIC PANEL
ALT: 16 U/L (ref 0–35)
AST: 16 U/L (ref 0–37)
Albumin: 3 g/dL — ABNORMAL LOW (ref 3.5–5.2)
Alkaline Phosphatase: 92 U/L (ref 39–117)
Anion gap: 9 (ref 5–15)
BUN: 13 mg/dL (ref 6–23)
CO2: 23 mmol/L (ref 19–32)
Calcium: 9.3 mg/dL (ref 8.4–10.5)
Chloride: 107 mmol/L (ref 96–112)
Creatinine, Ser: 0.68 mg/dL (ref 0.50–1.10)
GFR calc Af Amer: >90 mL/min (ref >90)
GFR calc non Af Amer: >90 mL/min (ref >90)
Glucose, Bld: 91 mg/dL (ref 70–99)
Potassium: 3.2 mmol/L — ABNORMAL LOW (ref 3.5–5.1)
Sodium: 139 mmol/L (ref 135–145)
Total Bilirubin: 0.5 mg/dL (ref 0.3–1.2)
Total Protein: 5.7 g/dL — ABNORMAL LOW (ref 6.0–8.3)

## 2014-05-17 LAB — MAGNESIUM: Magnesium: 1.9 mg/dL (ref 1.5–2.5)

## 2014-05-17 LAB — SEDIMENTATION RATE: Sed Rate: 15 mm/hr (ref 0–22)

## 2014-05-17 MED ORDER — SODIUM CHLORIDE 0.9 % IV BOLUS (SEPSIS)
1000.0000 mL | Freq: Once | INTRAVENOUS | Status: AC
  Administered 2014-05-17: 1000 mL via INTRAVENOUS

## 2014-05-17 MED ORDER — MORPHINE SULFATE 4 MG/ML IJ SOLN
4.0000 mg | Freq: Once | INTRAMUSCULAR | Status: AC
  Administered 2014-05-17: 4 mg via INTRAVENOUS
  Filled 2014-05-17: qty 1

## 2014-05-17 MED ORDER — LORAZEPAM 2 MG/ML IJ SOLN
1.0000 mg | Freq: Once | INTRAMUSCULAR | Status: AC
  Administered 2014-05-17: 1 mg via INTRAVENOUS
  Filled 2014-05-17: qty 1

## 2014-05-17 MED ORDER — BUTALBITAL-APAP-CAFFEINE 50-325-40 MG PO TABS: 1.0000 | ORAL_TABLET | Freq: Four times a day (QID) | ORAL | Status: DC | PRN

## 2014-05-17 MED ORDER — DIPHENHYDRAMINE HCL 50 MG/ML IJ SOLN
12.5000 mg | Freq: Once | INTRAMUSCULAR | Status: AC
  Administered 2014-05-17: 12.5 mg via INTRAVENOUS
  Filled 2014-05-17: qty 1

## 2014-05-17 MED ORDER — DIPHENHYDRAMINE HCL 50 MG/ML IJ SOLN
25.0000 mg | Freq: Once | INTRAMUSCULAR | Status: AC
  Administered 2014-05-17: 25 mg via INTRAVENOUS
  Filled 2014-05-17: qty 1

## 2014-05-17 MED ORDER — PROCHLORPERAZINE EDISYLATE 5 MG/ML IJ SOLN
10.0000 mg | Freq: Once | INTRAMUSCULAR | Status: AC
  Administered 2014-05-17: 10 mg via INTRAVENOUS
  Filled 2014-05-17: qty 2

## 2014-05-17 MED ORDER — GADOBENATE DIMEGLUMINE 529 MG/ML IV SOLN
15.0000 mL | Freq: Once | INTRAVENOUS | Status: AC | PRN
  Administered 2014-05-17: 15 mL via INTRAVENOUS

## 2014-05-17 MED ORDER — HEPARIN (PORCINE) IN NACL 100-0.45 UNIT/ML-% IJ SOLN
1100.0000 [IU]/h | INTRAMUSCULAR | Status: DC
  Administered 2014-05-17: 1100 [IU]/h via INTRAVENOUS
  Filled 2014-05-17: qty 250

## 2014-05-17 NOTE — Progress Notes (Signed)
[facility] here to transport patient. Patient stable, no change in condition.

## 2014-05-17 NOTE — Progress Notes (Signed)
[facility] called for transport. 

## 2014-05-17 NOTE — Progress Notes (Signed)
Dr. Adrian Blackwater in to evaluate patient.

## 2014-05-17 NOTE — Progress Notes (Signed)
Patient was refusing to be transferred by [facility] because she was worried about leaving her car here. Patient informed that she can get a taxi voucher to bring her back to her car and security here will be notified that her car is in mammogram parking area.

## 2014-05-17 NOTE — ED Notes (Signed)
Pt placed into gown and on monitor upon arrival to room. Pt monitored by blood pressure, pulse ox, and 12 lead. pts EKG given to and signed by Dr. Donnald Garre.

## 2014-05-17 NOTE — ED Notes (Signed)
Pt remains monitored by blood pressure, pulse ox, and 12 lead.  

## 2014-05-17 NOTE — ED Provider Notes (Signed)
CSN: [id number]     Arrival date & time 05/17/14  1411 History   First MD Initiated Contact with Patient 05/17/14 1705     Chief Complaint  Patient presents with  . Numbness     (Consider location/radiation/quality/duration/timing/severity/associated sxs/prior Treatment) HPI  Minahil Needleman is a 24 y.o. female with PMH of chronic pelvic pain, headache, interstitial cystitis presenting with intermittent numbness and tingling to right toes and mid calf that at times needs to her right ear. This has been ongoing for the past 2 days. In the last 2 hours patient has been unable to move right toes up to mid calf. She states he has numbness and tingling and has sensation but feels different than the left. Patient also with complaint of headache that developed gradually that she will go up with this morning. York Spaniel it is like other headaches she said had before but worse. She states she has intermittent floaters but none at this time. No blurred vision. Patient with history of headaches. No fevers or chills. She denies illicit drug use or alcohol use.   Past Medical History  Diagnosis Date  . Interstitial cystitis   . Chronic pelvic pain in female   . Headache(784.0)   . Infection     UTI  . Pyelonephritis affecting pregnancy in first trimester July 2015  . Depression 2007  . Substance abuse    Past Surgical History  Procedure Laterality Date  . Hernia repair    . Vagina surgery      tumor removed, went to cancer center in [facility]  . Cesarean section N/A 05/06/2014    Procedure: CESAREAN SECTION;  Surgeon: Levie Heritage, DO;  Location: [facility];  Service: Obstetrics;  Laterality: N/A;   Family History  Problem Relation Age of Onset  . Diabetes Mother   . Heart disease Mother   . Hypertension Father   . Diabetes Maternal Grandmother   . Hypertension Maternal Grandmother   . Stroke Maternal Grandmother   . Diabetes Maternal Grandfather   . Hypertension Maternal Grandfather   . Diabetes  Paternal Grandmother   . Diabetes Paternal Grandfather    History  Substance Use Topics  . Smoking status: Current Every Day Smoker -- 0.50 packs/day for 8 years    Types: Cigarettes  . Smokeless tobacco: Not on file     Comment: cutting back  . Alcohol Use: No   OB History    Gravida Para Term Preterm AB TAB SAB Ectopic Multiple Living   3 2 2  0 1 0 1 0 0 2     Review of Systems 10 Systems reviewed and are negative for acute change except as noted in the HPI.    Allergies  Ciprofloxacin  Home Medications   Prior to Admission medications   Medication Sig Start Date End Date Taking? Authorizing Provider  albuterol (PROVENTIL HFA;VENTOLIN HFA) 108 (90 BASE) MCG/ACT inhaler Inhale 2 puffs into the lungs every 6 (six) hours as needed for wheezing or shortness of breath. 03/09/14  Yes Fredirick Lathe, MD  ibuprofen (ADVIL,MOTRIN) 600 MG tablet Take 1 tablet (600 mg total) by mouth every 6 (six) hours. 05/08/14  Yes Abram Sander, MD  methadone (DOLOPHINE) 10 MG/ML solution Take 64 mg by mouth daily.    Yes Historical Provider, MD  ondansetron (ZOFRAN ODT) 8 MG disintegrating tablet Take 1 tablet (8 mg total) by mouth every 8 (eight) hours as needed for nausea or vomiting. 05/11/14  Yes Adam Phenix, MD  oxybutynin (DITROPAN) 5 MG tablet Take 1 tablet (5 mg total) by mouth 2 (two) times daily. 03/31/14  Yes Rhona Raider Stinson, DO  butalbital-acetaminophen-caffeine (FIORICET) 50-325-40 MG per tablet Take 1 tablet by mouth every 6 (six) hours as needed for headache. Patient not taking: Reported on 05/17/2014 05/17/14 05/17/15  Marny Lowenstein, PA-C  ferrous sulfate 325 (65 FE) MG tablet Take 1 tablet (325 mg total) by mouth 2 (two) times daily with a meal. Patient not taking: Reported on 05/16/2014 05/08/14   Cheral Marker, CNM  ondansetron (ZOFRAN) 4 MG tablet Take 1 tablet (4 mg total) by mouth every 4 (four) hours as needed for nausea or vomiting. Patient not taking: Reported on 05/17/2014 05/08/14    Abram Sander, MD   BP 106/62 mmHg  Pulse 85  Temp(Src) 98.9 F (37.2 C) (Oral)  Resp 14  Ht  (1.6 m)  Wt 172 lb (78.019 kg)  BMI 30.48 kg/m2  SpO2 97%  LMP  Physical Exam  Constitutional: She appears well-developed and well-nourished. No distress.  HENT:  Head: Normocephalic and atraumatic.  Mouth/Throat: Oropharynx is clear and moist.  Eyes: Conjunctivae and EOM are normal. Pupils are equal, round, and reactive to light. Right eye exhibits no discharge. Left eye exhibits no discharge.  Neck: Normal range of motion. Neck supple.  No nuchal rigidity  Cardiovascular: Normal rate and regular rhythm.   Pulmonary/Chest: Effort normal and breath sounds normal. No respiratory distress. She has no wheezes.  Abdominal: Soft. Bowel sounds are normal. She exhibits no distension. There is no tenderness.  Neurological: She is alert. No cranial nerve deficit. Coordination normal.  Speech is clear and goal oriented. Peripheral visual fields intact. Strength 5/5 in upper and lower extremities except for right lower extremity below knee which she does not move. Sensation intact but unable to distinguish sharp dull to right lower dorsal foot. DTR intact and symmetric, hyperreflexive. Intact rapid alternating movements in upper extremity, finger to nose.  No pronator drift in upper extremity. Pt unable to walk.   Skin: Skin is warm and dry. She is not diaphoretic.  Nursing note and vitals reviewed.   ED Course  Procedures (including critical care time) Labs Review Labs Reviewed  CBC WITH DIFFERENTIAL/PLATELET - Abnormal; Notable for the following:    RBC 3.12 (*)    Hemoglobin 9.6 (*)    HCT 28.5 (*)    All other components within normal limits  COMPREHENSIVE METABOLIC PANEL - Abnormal; Notable for the following:    Potassium 3.2 (*)    Total Protein 5.7 (*)    Albumin 3.0 (*)    All other components within normal limits  MAGNESIUM  SEDIMENTATION RATE  URINE RAPID DRUG SCREEN (HOSP  PERFORMED)  ETHANOL  PROTIME-INR  APTT  HEPARIN LEVEL (UNFRACTIONATED)  CBC    Imaging Review Dg Chest 2 View  05/17/2014   CLINICAL DATA:  Wheezing  EXAM: CHEST  2 VIEW  COMPARISON:  03/06/2014  FINDINGS: Lungs are clear.  No pleural effusion or pneumothorax.  The heart is normal in size.  Visualized osseous structures are within normal limits.  IMPRESSION: Normal chest radiographs.   Electronically Signed   By: Charline Bills M.D.   On: 05/17/2014 18:22   Ct Head Wo Contrast  05/17/2014   CLINICAL DATA:  Numbness  EXAM: CT HEAD WITHOUT CONTRAST  TECHNIQUE: Contiguous axial images were obtained from the base of the skull through the vertex without intravenous contrast.  COMPARISON:  06/01/2008  FINDINGS: No evidence of parenchymal hemorrhage or extra-axial fluid collection. No mass lesion, mass effect, or midline shift.  No CT evidence of acute infarction.  Cerebral volume is within normal limits.  No ventriculomegaly.  The visualized paranasal sinuses are essentially clear. The mastoid air cells are unopacified.  No evidence of calvarial fracture.  IMPRESSION: Normal head CT.   Electronically Signed   By: Charline Bills M.D.   On: 05/17/2014 18:11   Mr Laqueta Jean ZO Contrast  05/17/2014   CLINICAL DATA:  RIGHT-sided paresthesia for 5 minutes, beginning 2 days ago with residual weakness, 1 day of Headache, RIGHT arm weakness. Postpartum patient, history of infection and substance abuse.  EXAM: MRI HEAD WITH CONTRAST  MRV HEAD WITHOUT CONTRAST  TECHNIQUE: Multiplanar, multiecho pulse sequences of the brain and surrounding structures were obtained without and with intravenous contrast. Angiographic images of the head were obtained using MRV technique without contrast. MIP images provided.  CONTRAST:  15mL MULTIHANCE GADOBENATE DIMEGLUMINE 529 MG/ML IV SOLN  COMPARISON:  CT of the head May 17, 2014 at 1802 hours  FINDINGS: MRI HEAD FINDINGS  Faint area of approximately 2.6 cm reduced diffusion in LEFT  posterior frontal cortex at the convexity, with poor corresponding low ADC values. Mildly expansile T2 hyperintense signal resulting in mild sulcal effacement without midline shift. Curvilinear susceptibility artifact within high LEFT frontal extra-axial space, axial 23 and 24/24 highly concerning for cortical vein thrombosis. Equivocal loss of the superior sagittal sinus flow void focally on sagittal T1 12/23. No abnormal parenchymal enhancement. Heterogeneous enhancement of the superior sagittal sinus.  Ventricles and sulci are otherwise normal for patient's age. No midline shift. No satellite areas of abnormal signal are reduced diffusion. No abnormal extra-axial fluid collections. Normal major intracranial vascular flow voids seen at the skull base.  Ocular globes and orbital contents are unremarkable. Paranasal sinuses mastoid air cells well-aerated. No abnormal sellar expansion. No cerebellar tonsillar ectopia. No suspicious calvarial bone marrow signal.  MRV HEAD FINDINGS  Irregular poor flow related enhancement of the mid superior sagittal sinus, the anterior and more proximal segments are widely patent. RIGHT transverse sinus is dominant but both are patent with normal appearance of the sagittal sinus and included internal cerebral veins.  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 Corresponding cortical vein thrombosis.  MRA HEAD: Partially thrombosed superior sagittal sinus.   Electronically Signed   By: Awilda Metro   On: 05/17/2014 23:03   Mr Venogram Head  05/17/2014   CLINICAL DATA:  RIGHT-sided paresthesia for 5 minutes, beginning 2 days ago with residual weakness, 1 day of Headache, RIGHT arm weakness. Postpartum patient, history of infection and substance abuse.  EXAM: MRI HEAD WITH CONTRAST  MRV HEAD WITHOUT CONTRAST  TECHNIQUE: Multiplanar, multiecho pulse sequences of the brain and surrounding structures were obtained  without and with intravenous contrast. Angiographic images of the head were obtained using MRV technique without contrast. MIP images provided.  CONTRAST:  15mL MULTIHANCE GADOBENATE DIMEGLUMINE 529 MG/ML IV SOLN  COMPARISON:  CT of the head May 17, 2014 at 1802 hours  FINDINGS: MRI HEAD FINDINGS  Faint area of approximately 2.6 cm reduced diffusion in LEFT posterior frontal cortex at the convexity, with poor corresponding low ADC values. Mildly expansile T2 hyperintense signal resulting in mild sulcal effacement without midline shift. Curvilinear susceptibility artifact within high LEFT frontal extra-axial space, axial 23 and 24/24 highly concerning for cortical vein thrombosis. Equivocal loss of the superior  sagittal sinus flow void focally on sagittal T1 12/23. No abnormal parenchymal enhancement. Heterogeneous enhancement of the superior sagittal sinus.  Ventricles and sulci are otherwise normal for patient's age. No midline shift. No satellite areas of abnormal signal are reduced diffusion. No abnormal extra-axial fluid collections. Normal major intracranial vascular flow voids seen at the skull base.  Ocular globes and orbital contents are unremarkable. Paranasal sinuses mastoid air cells well-aerated. No abnormal sellar expansion. No cerebellar tonsillar ectopia. No suspicious calvarial bone marrow signal.  MRV HEAD FINDINGS  Irregular poor flow related enhancement of the mid superior sagittal sinus, the anterior and more proximal segments are widely patent. RIGHT transverse sinus is dominant but both are patent with normal appearance of the sagittal sinus and included internal cerebral veins.  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 Corresponding cortical vein thrombosis.  MRA HEAD: Partially thrombosed superior sagittal sinus.   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 She is presenting today because she is unable to move her right foot. VSS. Patient with intact sensation to right lower extremity dull sensation she is unable to move right foot with intact pulses. Pt has hyper reflexia throughout. Laboratory workup without acute abnormalities and normal head CT. Consult to neurology with Dr. Cyril Mourning who recommended MRI with venogram. To evaluate for CVA, MM, VTE. Pt with no complaints at this time.  23:15 MR resulted with findings of L frontal cortical venous infarction and cortical vein thrombosis. MRA with partially thrombosed superior sagittal sinus. Spoke with Dr. Roseanne Reno with neurology who recommended starting the patient on heparin and recommended further workup for hypercoagulable state as well as stroke with recommendation for PT and OT. Spoke with Dr. Alvester Morin who agrees to evaluate the patient with plan for admission.  Discussed all results and patient verbalizes understanding and agrees with plan.  This is a shared patient. This patient was discussed with the physician who saw and evaluated the patient and agrees with the plan.   Oswaldo Conroy, PA-C 05/18/14 0011  Arby Barrette, MD 05/20/14 [phone]

## 2014-05-17 NOTE — Progress Notes (Signed)
ANTICOAGULATION CONSULT NOTE - Initial Consult  Pharmacy Consult for Heparin Indication: CVA   Allergies  Allergen Reactions  . Ciprofloxacin Diarrhea and Nausea And Vomiting    Patient Measurements: Height:  (160 cm) Weight: 172 lb (78.019 kg) IBW/kg (Calculated) : 52.4 Heparin Dosing Weight: 70 kg  Vital Signs: Temp: 98.9 F (37.2 C) (03/08 1705) Temp Source: Oral (03/08 1705) BP: 111/57 mmHg (03/08 2300) Pulse Rate: 76 (03/08 2300)  Labs:  Recent Labs  05/16/14 2225 05/17/14 1728  HGB 9.7* 9.6*  HCT 28.9* 28.5*  PLT 302 325  CREATININE 0.67 0.68    Estimated Creatinine Clearance: 107.2 mL/min (by C-G formula based on Cr of 0.68).   Medical History: Past Medical History  Diagnosis Date  . Interstitial cystitis   . Chronic pelvic pain in female   . Headache(784.0)   . Infection     UTI  . Pyelonephritis affecting pregnancy in first trimester July 2015  . Depression 2007  . Substance abuse     Medications:  Albuterol  Zofran  Ditropan  Assessment: 24 yo female with acute frontal cortical venous infarction, s/p C-section 2/26, for heparin  Goal of Therapy:  Heparin level 0.3-0.5 Monitor platelets by anticoagulation protocol: Yes   Plan:  Start heparin 1100 units/hr Check heparin level in 8 hours.  Eddie Candle 05/17/2014,11:32 PM

## 2014-05-17 NOTE — Progress Notes (Signed)
Patient can bend R knee and lift leg off of the bed. Cannot not wiggle toes or move R foot.

## 2014-05-17 NOTE — ED Notes (Signed)
Patient transported to X-ray 

## 2014-05-17 NOTE — ED Notes (Signed)
Pt postpartum. Had C section Feb 26th 2016. Pt started having right sided numbness two days ago. Pt unable to move right foot/toes up to midcalf. Pt states the numbness is intermittent and moves up to her ear at times. Pt was at [employer] today and fell due to numbness in foot.  Pt also complaining of migraines. BP 123/65, HR 79, 93% on room air.

## 2014-05-17 NOTE — Consult Note (Signed)
Referring Physician: Dr. Johnney Killian Reason for the consult: right sided weakness-paresthesias   24 y/o female with a past medical history that is significant for depression, substance abuse, HA, s/p C-section 05/06/14, present for further evaluation of the above stated symptoms. Patient indicated that 2 days ago she experienced sudden onset of numbness-tingling from the right hip to the right toes that lasted for approximately 5 minutes and resolved but left her with a heavy/weak sensation in her right foot that was most prominent when she was driving and pressing on the gas pedal. Then, yesterday she developed a HA that is still present and started having numbness and tingling around her right breast, right flank area, moving up to the right ear, and in the past coupe of hours she has been weak in the right leg and can not move the right foot. She also endorses some weakness of the right arm. Denies vertigo, double vision, slurred speech, language or visual impairment, bladder or bowel incontinence. No recent fever, infection, skin rah, vaccinations, trauma, or foreign travel. CT brain was personally reviewed and showed no acute abnormality.  Date last known well: uncertain Time last known well: uncertain tPA Given:no, out of the window   Past Medical History  Diagnosis Date  . Interstitial cystitis   . Chronic pelvic pain in female   . Headache(784.0)   . Infection     UTI  . Pyelonephritis affecting pregnancy in first trimester July 2015  . Depression 2007  . Substance abuse     Past Surgical History  Procedure Laterality Date  . Hernia repair    . Vagina surgery      tumor removed, went to cancer center in [facility]  . Cesarean section N/A 05/06/2014    Procedure: CESAREAN SECTION;  Surgeon: Truett Mainland, DO;  Location: [facility];  Service: Obstetrics;  Laterality: N/A;    Family History  Problem Relation Age of Onset  . Diabetes Mother   . Heart disease Mother   . Hypertension Father    . Diabetes Maternal Grandmother   . Hypertension Maternal Grandmother   . Stroke Maternal Grandmother   . Diabetes Maternal Grandfather   . Hypertension Maternal Grandfather   . Diabetes Paternal Grandmother   . Diabetes Paternal Grandfather    Social History:  reports that she has been smoking Cigarettes.  She has a 4 pack-year smoking history. She does not have any smokeless tobacco history on file. She reports that she does not drink alcohol or use illicit drugs.  Allergies:  Allergies  Allergen Reactions  . Ciprofloxacin Diarrhea and Nausea And Vomiting    Medications:                                                                                                                           I have reviewed the patient's current medications.  ROS:  History obtained from the patient  General ROS: negative for - chills, fatigue, fever, night sweats, or weight loss Psychological ROS: negative for - behavioral disorder, hallucinations, memory difficulties, mood swings or suicidal ideation Ophthalmic ROS: negative for - blurry vision, double vision, eye pain or loss of vision ENT ROS: negative for - epistaxis, nasal discharge, oral lesions, sore throat, tinnitus or vertigo Allergy and Immunology ROS: negative for - hives or itchy/watery eyes Hematological and Lymphatic ROS: negative for - bleeding problems, bruising or swollen lymph nodes Endocrine ROS: negative for - galactorrhea, hair pattern changes, polydipsia/polyuria or temperature intolerance Respiratory ROS: negative for - cough, hemoptysis, shortness of breath or wheezing Cardiovascular ROS: negative for - chest pain, dyspnea on exertion, edema or irregular heartbeat Gastrointestinal ROS: negative for - abdominal pain, diarrhea, hematemesis, nausea/vomiting or stool incontinence Genito-Urinary  ROS: negative for - dysuria, hematuria, incontinence or urinary frequency/urgency Musculoskeletal ROS: negative for - joint swelling Neurological ROS: as noted in HPI Dermatological ROS: negative for rash and skin lesion changes  Physical exam: pleasant female in no apparent distress. Blood pressure 134/93, pulse 83, temperature 98.9 F (37.2 C), temperature source Oral, resp. rate 14, height 5' 3"  (1.6 m), weight 78.019 kg (172 lb), SpO2 97 %, unknown if currently breastfeeding. Head: normocephalic. Neck: supple, no bruits, no JVD. Cardiac: no murmurs. Lungs: clear. Abdomen: soft, no tender, no mass. Extremities: no edema. Skin: no rash Neurologic Examination:                                                                                                      General: Mental Status: Alert, oriented, thought content appropriate.  Speech fluent without evidence of aphasia.  Able to follow 3 step commands without difficulty. Cranial Nerves: II: Discs flat bilaterally; Visual fields grossly normal, pupils equal, round, reactive to light and accommodation III,IV, VI: ptosis not present, extra-ocular motions intact bilaterally V,VII: smile symmetric, facial light touch sensation normal bilaterally VIII: hearing normal bilaterally IX,X: uvula rises symmetrically XI: bilateral shoulder shrug XII: midline tongue extension without atrophy or fasciculations Motor: Significant for right hemiparesis leg greater than arm, with complete weakness of the right foot. Tone and bulk:normal tone throughout; no atrophy noted Sensory: Pinprick and light touch diminished from the right knee down. Deep Tendon Reflexes:  Generalized hyperreflexia with few beats of nonsustained clonus right foot. Plantars: Right: mute   Left: downgoing Cerebellar: normal finger-to-nose, heel-to-shin no tested Gait:  No tested for safety reasons      Results for orders placed or performed during the hospital encounter  of 05/16/14 (from the past 48 hour(s))  Urinalysis, Routine w reflex microscopic     Status: Abnormal   Collection Time: 05/16/14  9:40 PM  Result Value Ref Range   Color, Urine YELLOW YELLOW   APPearance CLEAR CLEAR   Specific Gravity, Urine 1.020 1.005 - 1.030   pH 5.5 5.0 - 8.0   Glucose, UA NEGATIVE NEGATIVE mg/dL   Hgb urine dipstick MODERATE (A) NEGATIVE   Bilirubin Urine NEGATIVE NEGATIVE   Ketones, ur NEGATIVE NEGATIVE mg/dL   Protein,  ur NEGATIVE NEGATIVE mg/dL   Urobilinogen, UA 0.2 0.0 - 1.0 mg/dL   Nitrite NEGATIVE NEGATIVE   Leukocytes, UA SMALL (A) NEGATIVE  Urine microscopic-add on     Status: Abnormal   Collection Time: 05/16/14  9:40 PM  Result Value Ref Range   Squamous Epithelial / LPF FEW (A) RARE   WBC, UA 7-10 <3 WBC/hpf   RBC / HPF 0-2 <3 RBC/hpf   Bacteria, UA FEW (A) RARE   Urine-Other MUCOUS PRESENT   CBC with Differential/Platelet     Status: Abnormal   Collection Time: 05/16/14 10:25 PM  Result Value Ref Range   WBC 7.8 4.0 - 10.5 K/uL   RBC 3.13 (L) 3.87 - 5.11 MIL/uL   Hemoglobin 9.7 (L) 12.0 - 15.0 g/dL   HCT 28.9 (L) 36.0 - 46.0 %   MCV 92.3 78.0 - 100.0 fL   MCH 31.0 26.0 - 34.0 pg   MCHC 33.6 30.0 - 36.0 g/dL   RDW 12.6 11.5 - 15.5 %   Platelets 302 150 - 400 K/uL   Neutrophils Relative % 55 43 - 77 %   Neutro Abs 4.2 1.7 - 7.7 K/uL   Lymphocytes Relative 38 12 - 46 %   Lymphs Abs 3.0 0.7 - 4.0 K/uL   Monocytes Relative 6 3 - 12 %   Monocytes Absolute 0.5 0.1 - 1.0 K/uL   Eosinophils Relative 1 0 - 5 %   Eosinophils Absolute 0.1 0.0 - 0.7 K/uL   Basophils Relative 0 0 - 1 %   Basophils Absolute 0.0 0.0 - 0.1 K/uL  Comprehensive metabolic panel     Status: Abnormal   Collection Time: 05/16/14 10:25 PM  Result Value Ref Range   Sodium 139 135 - 145 mmol/L   Potassium 3.2 (L) 3.5 - 5.1 mmol/L   Chloride 104 96 - 112 mmol/L   CO2 26 19 - 32 mmol/L   Glucose, Bld 104 (H) 70 - 99 mg/dL   BUN 13 6 - 23 mg/dL   Creatinine, Ser 0.67 0.50  - 1.10 mg/dL   Calcium 9.1 8.4 - 10.5 mg/dL   Total Protein 6.0 6.0 - 8.3 g/dL   Albumin 3.2 (L) 3.5 - 5.2 g/dL   AST 16 0 - 37 U/L   ALT 16 0 - 35 U/L   Alkaline Phosphatase 105 39 - 117 U/L   Total Bilirubin 0.7 0.3 - 1.2 mg/dL   GFR calc non Af Amer >90 >90 mL/min   GFR calc Af Amer >90 >90 mL/min    Comment: (NOTE) The eGFR has been calculated using the CKD EPI equation. This calculation has not been validated in all clinical situations. eGFR's persistently <90 mL/min signify possible Chronic Kidney Disease.    Anion gap 9 5 - 15  Protein / creatinine ratio, urine     Status: None   Collection Time: 05/16/14 11:02 PM  Result Value Ref Range   Creatinine, Urine 94.00 mg/dL   Total Protein, Urine <6 mg/dL    Comment: NO NORMAL RANGE ESTABLISHED FOR THIS TEST REPEATED TO VERIFY    Protein Creatinine Ratio        0.00 - 0.15    Comment: RESULT BELOW REPORTABLE RANGE, UNABLE TO CALCULATE.    No results found.    Assessment: 25 y.o. female with new onset right sided paresthesias and weakness with a pattern described above. She has righ t sided weakness leg/foot greater than arm and prominent generalized hyperreflexia. Patient is status post  c-section on 2/26 and thus the differential includes CVT (however, the prominent hyperreflexia and evolution of her symptoms are rather unusual for stroke), cord syndrome, or a multifocal process. Will suggest admission to he hospital and obtaining MRI brain, MRV, as well as MRI cervico-thoracic spine with and without contrast. PT. Will follow up.    Dorian Pod, MD [facility] [phone]  05/17/2014, 6:07 PM

## 2014-05-17 NOTE — ED Notes (Addendum)
Pt c/o R foot numbness x 2 days. [facility] and transferred to [facility] for further evaluation. Pt also c/o headache onset today. Reports unable to move R foot; sensation less on R leg. Pedal pulses strong. Pt eating applesauce and drinking sprite on assessment. Reports headache 6/10pain after medication

## 2014-05-17 NOTE — Progress Notes (Signed)
Report given to M. Mayford Knife, CNM and S. Chase Picket, NP in provider office.

## 2014-05-17 NOTE — Discharge Instructions (Signed)
Migraine Headache A migraine headache is an intense, throbbing pain on one or both sides of your head. A migraine can last for 30 minutes to several hours. CAUSES  The exact cause of a migraine headache is not always known. However, a migraine may be caused when nerves in the brain become irritated and release chemicals that cause inflammation. This causes pain. Certain things may also trigger migraines, such as:  Alcohol.  Smoking.  Stress.  Menstruation.  Aged cheeses.  Foods or drinks that contain nitrates, glutamate, aspartame, or tyramine.  Lack of sleep.  Chocolate.  Caffeine.  Hunger.  Physical exertion.  Fatigue.  Medicines used to treat chest pain (nitroglycerine), birth control pills, estrogen, and some blood pressure medicines. SIGNS AND SYMPTOMS  Pain on one or both sides of your head.  Pulsating or throbbing pain.  Severe pain that prevents daily activities.  Pain that is aggravated by any physical activity.  Nausea, vomiting, or both.  Dizziness.  Pain with exposure to bright lights, loud noises, or activity.  General sensitivity to bright lights, loud noises, or smells. Before you get a migraine, you may get warning signs that a migraine is coming (aura). An aura may include:  Seeing flashing lights.  Seeing bright spots, halos, or zigzag lines.  Having tunnel vision or blurred vision.  Having feelings of numbness or tingling.  Having trouble talking.  Having muscle weakness. DIAGNOSIS  A migraine headache is often diagnosed based on:  Symptoms.  Physical exam.  A CT scan or MRI of your head. These imaging tests cannot diagnose migraines, but they can help rule out other causes of headaches. TREATMENT Medicines may be given for pain and nausea. Medicines can also be given to help prevent recurrent migraines.  HOME CARE INSTRUCTIONS  Only take over-the-counter or prescription medicines for pain or discomfort as directed by your  health care provider. The use of long-term narcotics is not recommended.  Lie down in a dark, quiet room when you have a migraine.  Keep a journal to find out what may trigger your migraine headaches. For example, write down:  What you eat and drink.  How much sleep you get.  Any change to your diet or medicines.  Limit alcohol consumption.  Quit smoking if you smoke.  Get 7-9 hours of sleep, or as recommended by your health care provider.  Limit stress.  Keep lights dim if bright lights bother you and make your migraines worse. SEEK IMMEDIATE MEDICAL CARE IF:   Your migraine becomes severe.  You have a fever.  You have a stiff neck.  You have vision loss.  You have muscular weakness or loss of muscle control.  You start losing your balance or have trouble walking.  You feel faint or pass out.  You have severe symptoms that are different from your first symptoms. MAKE SURE YOU:   Understand these instructions.  Will watch your condition.  Will get help right away if you are not doing well or get worse. Document Released: 02/25/2005 Document Revised: 07/12/2013 Document Reviewed: 11/02/2012 [facility] Patient Information 2015 [place], [place]. This information is not intended to replace advice given to you by your health care provider. Make sure you discuss any questions you have with your health care provider. Tension Headache A tension headache is pain, pressure, or aching felt over the front and sides of the head. Tension headaches often come after stress, feeling worried (anxiety), or feeling sad or down for a while (depressed). HOME CARE  Only take medicine as told by your doctor.  Lie down in a dark, quiet room when you have a headache.  Keep a journal to find out if certain things bring on headaches. For example, write down:  What you eat and drink.  How much sleep you get.  Any change to your diet or medicines.  Relax by getting a massage or doing  other relaxing activities.  Put ice or heat packs on the head and neck area as told by your doctor.  Lessen stress.  Sit up straight. Do not tighten (tense) your muscles.  Quit smoking if you smoke.  Lessen how much alcohol you drink.  Lessen how much caffeine you drink, or stop drinking caffeine.  Eat and exercise regularly.  Get enough sleep.  Avoid using too much pain medicine. GET HELP RIGHT AWAY IF:   Your headache becomes really bad.  You have a fever.  You have a stiff neck.  You have trouble seeing.  Your muscles are weak, or you lose muscle control.  You lose your balance or have trouble walking.  You feel like you will pass out (faint), or you pass out.  You have really bad symptoms that are different than your first symptoms.  You have problems with the medicines given to you by your doctor.  Your medicines do not work.  Your headache feels different than the other headaches.  You feel sick to your stomach (nauseous) or throw up (vomit). MAKE SURE YOU:   Understand these instructions.  Will watch your condition.  Will get help right away if you are not doing well or get worse. Document Released: 05/22/2009 Document Revised: 05/20/2011 Document Reviewed: 02/15/2011 [facility] Patient Information 2015 [place], [place]. This information is not intended to replace advice given to you by your health care provider. Make sure you discuss any questions you have with your health care provider.

## 2014-05-17 NOTE — MAU Provider Note (Signed)
History     CSN: [id number]  Arrival date and time: 05/17/14 1411   First Provider Initiated Contact with Patient 05/17/14 1455      Chief Complaint  Patient presents with  . Numbness   HPI   Victoria Alvarez is a 24 y.o. white female, [id number] with history of interstitial cystitis, opoid dependence, and s/p LTCS on 05/06/14, who presents with weakness in her right leg. The weakness began with intermittent numbness and tingling 2 days ago while squatting to sit in a chair. Pt was leaving a CPR class today at noon and fell due to increasing weakness in her right leg. She did not hit her head or injure herself during the fall. Within the past few hours she has had an increase in weakness to the point that she is no longer able to walk steadily and required a wheelchair to be brought to her ED room. She has associated headaches for which she presented to the ED last night. She currently reports constant weakness in her right leg up to her hip and intermittent numbness and tingling in her right arm and right side of her chest. LTCS was performed with a spinal block on 05/06/14.     OB History    Gravida Para Term Preterm AB TAB SAB Ectopic Multiple Living   3 2 2  0 1 0 1 0 0 2      Past Medical History  Diagnosis Date  . Interstitial cystitis   . Chronic pelvic pain in female   . Headache(784.0)   . Infection     UTI  . Pyelonephritis affecting pregnancy in first trimester July 2015  . Depression 2007  . Substance abuse     Past Surgical History  Procedure Laterality Date  . Hernia repair    . Vagina surgery      tumor removed, went to cancer center in [facility]  . Cesarean section N/A 05/06/2014    Procedure: CESAREAN SECTION;  Surgeon: Truett Mainland, DO;  Location: [facility];  Service: Obstetrics;  Laterality: N/A;    Family History  Problem Relation Age of Onset  . Diabetes Mother   . Heart disease Mother   . Hypertension Father   . Diabetes Maternal Grandmother   . Hypertension  Maternal Grandmother   . Stroke Maternal Grandmother   . Diabetes Maternal Grandfather   . Hypertension Maternal Grandfather   . Diabetes Paternal Grandmother   . Diabetes Paternal Grandfather     History  Substance Use Topics  . Smoking status: Current Every Day Smoker -- 0.50 packs/day for 8 years    Types: Cigarettes  . Smokeless tobacco: Not on file     Comment: cutting back  . Alcohol Use: No    Allergies:  Allergies  Allergen Reactions  . Ciprofloxacin Diarrhea and Nausea And Vomiting    Prescriptions prior to admission  Medication Sig Dispense Refill Last Dose  . albuterol (PROVENTIL HFA;VENTOLIN HFA) 108 (90 BASE) MCG/ACT inhaler Inhale 2 puffs into the lungs every 6 (six) hours as needed for wheezing or shortness of breath. 1 Inhaler 2 Past Month at Unknown time  . ibuprofen (ADVIL,MOTRIN) 600 MG tablet Take 1 tablet (600 mg total) by mouth every 6 (six) hours. 30 tablet 0 Past Month at Unknown time  . methadone (DOLOPHINE) 10 MG/ML solution Take 64 mg by mouth daily.    05/17/2014 at Unknown time  . ondansetron (ZOFRAN ODT) 8 MG disintegrating tablet Take 1 tablet (8 mg total) by  mouth every 8 (eight) hours as needed for nausea or vomiting. 20 tablet 0 Past Month at Unknown time  . oxybutynin (DITROPAN) 5 MG tablet Take 1 tablet (5 mg total) by mouth 2 (two) times daily. 60 tablet 3 Past Month at Unknown time  . butalbital-acetaminophen-caffeine (FIORICET) 50-325-40 MG per tablet Take 1 tablet by mouth every 6 (six) hours as needed for headache. (Patient not taking: Reported on 05/17/2014) 20 tablet 0 Not Taking at Unknown time  . ferrous sulfate 325 (65 FE) MG tablet Take 1 tablet (325 mg total) by mouth 2 (two) times daily with a meal. (Patient not taking: Reported on 05/16/2014) 60 tablet 3 Not Taking at Unknown time  . ondansetron (ZOFRAN) 4 MG tablet Take 1 tablet (4 mg total) by mouth every 4 (four) hours as needed for nausea or vomiting. (Patient not taking: Reported on  05/17/2014) 20 tablet 0 Not Taking at Unknown time    Review of Systems  Constitutional: Negative for fever, chills, weight loss, malaise/fatigue and diaphoresis.  HENT: Negative for congestion and sore throat.   Eyes: Negative for blurred vision, double vision and pain.  Respiratory: Negative for cough and wheezing.   Cardiovascular: Negative for chest pain, palpitations and leg swelling.  Gastrointestinal: Negative for heartburn, nausea, vomiting, abdominal pain, diarrhea, constipation and blood in stool.  Genitourinary: Negative for dysuria, urgency, frequency and hematuria.  Musculoskeletal: Negative for myalgias, back pain and neck pain.  Skin: Negative for itching and rash.  Neurological: Positive for tingling, focal weakness, weakness and headaches. Negative for dizziness, tremors, sensory change, speech change, seizures and loss of consciousness.  Endo/Heme/Allergies: Negative for environmental allergies. Does not bruise/bleed easily.  Psychiatric/Behavioral: Negative for depression and substance abuse. The patient is not nervous/anxious.    Physical Exam   Blood pressure 133/89, pulse 85, temperature 98.2 F (36.8 C), temperature source Oral, resp. rate 18, unknown if currently breastfeeding.  Physical Exam  Constitutional: She is oriented to person, place, and time. She appears well-developed and well-nourished. No distress.  overweight  HENT:  Head: Normocephalic and atraumatic.  Right Ear: External ear normal.  Left Ear: External ear normal.  Eyes: Conjunctivae and EOM are normal. Pupils are equal, round, and reactive to light.  Neck: Normal range of motion. Neck supple.  Cardiovascular: Normal rate, regular rhythm, normal heart sounds and intact distal pulses.   Respiratory: Effort normal. She has wheezes. She exhibits no tenderness.  Inspiratory wheezes in RUL.  GI: Soft. Bowel sounds are normal. She exhibits no distension and no mass. There is no tenderness. There is no  rebound and no guarding.  Musculoskeletal: She exhibits tenderness. She exhibits no edema.  Exquisite spinal tenderness at the area of L4-S1. No active ROM of right ankle and toes. LROM of right knee, and hip. Full ROM of right upper extremity. Full ROM of left side. No peripheral MSK tenderness.  Neurological: She is alert and oriented to person, place, and time. No cranial nerve deficit.  CN II-XII intact. Peripheral sensation intact.Pt is hyperreflexic throughout. No cerebellar dysfunction with RAM and finger to nose intact. Negative Romberg.  Strength of right ankle and toes 0/5. Unable to dorsiflex or plantar flex. 2/5 strength of right knee and hip. 3/5 strength of right elbow and shoulder. 5/5 strength on left side. Unable to pick up right foot during ambulation.  Skin: Skin is warm and dry. No rash noted. She is not diaphoretic. No erythema.  Psychiatric: She has a normal mood and affect. Her behavior  is normal.    MAU Course  Procedures  MDM Discussed with Dr. Nehemiah Settle whom is agreeable to come to MAU to see pt.  After his assessment, it is determined that patient requires transfer to [facility] ED for possible MRI/neuro eval.  Concern for stroke.    Assessment and Plan   A:  1. Weakness of foot, right     P: Transfer to [facility] ED.  Dr. Kathrynn Humble accepting pt.    Andree Moro 05/17/2014, 3:51 PM   History, physical exam, assessment and plan as well as documentation all occurred with MAU provider and student both present and collaborating.  Furthermore, Dr. Nehemiah Settle questioned and examined the patient to come to the conclusion that transfer to other facility is appropriate.   Allie Dimmer, PA-C

## 2014-05-17 NOTE — MAU Note (Addendum)
C/S on 2/25. Has been seen in MAU several times post op, most recently last night. C/O numbness R foot, leg, side and arm. States cannot lift R leg or stand on R foot. Moves R arm and the rest of her body OK. No drooping of face. Can speak sentences, smile. Bilateral grips equal. States she could walk earlier today. Went to visit son in NICU, went to infant CPR class. When she was leaving class around 1330, states she fell going out the door. That is when she noticed her foot and leg were completely numb.

## 2014-05-18 ENCOUNTER — Encounter (HOSPITAL_COMMUNITY): Payer: Self-pay | Admitting: General Practice

## 2014-05-18 DIAGNOSIS — O873 Cerebral venous thrombosis in the puerperium: Secondary | ICD-10-CM | POA: Diagnosis present

## 2014-05-18 DIAGNOSIS — G43909 Migraine, unspecified, not intractable, without status migrainosus: Secondary | ICD-10-CM | POA: Diagnosis present

## 2014-05-18 DIAGNOSIS — I636 Cerebral infarction due to cerebral venous thrombosis, nonpyogenic: Secondary | ICD-10-CM | POA: Insufficient documentation

## 2014-05-18 DIAGNOSIS — D509 Iron deficiency anemia, unspecified: Secondary | ICD-10-CM | POA: Diagnosis present

## 2014-05-18 DIAGNOSIS — I6330 Cerebral infarction due to thrombosis of unspecified cerebral artery: Secondary | ICD-10-CM

## 2014-05-18 DIAGNOSIS — G8190 Hemiplegia, unspecified affecting unspecified side: Secondary | ICD-10-CM | POA: Diagnosis present

## 2014-05-18 DIAGNOSIS — R531 Weakness: Secondary | ICD-10-CM | POA: Insufficient documentation

## 2014-05-18 DIAGNOSIS — J45909 Unspecified asthma, uncomplicated: Secondary | ICD-10-CM | POA: Diagnosis present

## 2014-05-18 DIAGNOSIS — F1120 Opioid dependence, uncomplicated: Secondary | ICD-10-CM | POA: Diagnosis present

## 2014-05-18 DIAGNOSIS — Z6830 Body mass index (BMI) 30.0-30.9, adult: Secondary | ICD-10-CM | POA: Diagnosis not present

## 2014-05-18 DIAGNOSIS — F17210 Nicotine dependence, cigarettes, uncomplicated: Secondary | ICD-10-CM | POA: Diagnosis present

## 2014-05-18 DIAGNOSIS — G8191 Hemiplegia, unspecified affecting right dominant side: Secondary | ICD-10-CM | POA: Diagnosis present

## 2014-05-18 DIAGNOSIS — E663 Overweight: Secondary | ICD-10-CM | POA: Diagnosis present

## 2014-05-18 DIAGNOSIS — O99325 Drug use complicating the puerperium: Secondary | ICD-10-CM | POA: Diagnosis present

## 2014-05-18 LAB — COMPREHENSIVE METABOLIC PANEL
ALT: 13 U/L (ref 0–35)
AST: 13 U/L (ref 0–37)
Albumin: 2.6 g/dL — ABNORMAL LOW (ref 3.5–5.2)
Alkaline Phosphatase: 75 U/L (ref 39–117)
Anion gap: 9 (ref 5–15)
BUN: 10 mg/dL (ref 6–23)
CO2: 20 mmol/L (ref 19–32)
Calcium: 8.2 mg/dL — ABNORMAL LOW (ref 8.4–10.5)
Chloride: 108 mmol/L (ref 96–112)
Creatinine, Ser: 0.68 mg/dL (ref 0.50–1.10)
GFR calc Af Amer: >90 mL/min (ref >90)
GFR calc non Af Amer: >90 mL/min (ref >90)
Glucose, Bld: 95 mg/dL (ref 70–99)
Potassium: 3.3 mmol/L — ABNORMAL LOW (ref 3.5–5.1)
Sodium: 137 mmol/L (ref 135–145)
Total Bilirubin: 0.5 mg/dL (ref 0.3–1.2)
Total Protein: 5 g/dL — ABNORMAL LOW (ref 6.0–8.3)

## 2014-05-18 LAB — CBC WITH DIFFERENTIAL/PLATELET
Basophils Absolute: 0 10*3/uL (ref 0.0–0.1)
Basophils Relative: 0 % (ref 0–1)
Eosinophils Absolute: 0.1 10*3/uL (ref 0.0–0.7)
Eosinophils Relative: 1 % (ref 0–5)
HCT: 25.2 % — ABNORMAL LOW (ref 36.0–46.0)
Hemoglobin: 8.2 g/dL — ABNORMAL LOW (ref 12.0–15.0)
Lymphocytes Relative: 49 % — ABNORMAL HIGH (ref 12–46)
Lymphs Abs: 3.5 10*3/uL (ref 0.7–4.0)
MCH: 30.1 pg (ref 26.0–34.0)
MCHC: 32.5 g/dL (ref 30.0–36.0)
MCV: 92.6 fL (ref 78.0–100.0)
Monocytes Absolute: 0.4 10*3/uL (ref 0.1–1.0)
Monocytes Relative: 5 % (ref 3–12)
Neutro Abs: 3.3 10*3/uL (ref 1.7–7.7)
Neutrophils Relative %: 45 % (ref 43–77)
Platelets: 263 10*3/uL (ref 150–400)
RBC: 2.72 MIL/uL — ABNORMAL LOW (ref 3.87–5.11)
RDW: 12.8 % (ref 11.5–15.5)
WBC: 7.3 10*3/uL (ref 4.0–10.5)

## 2014-05-18 LAB — RAPID URINE DRUG SCREEN, HOSP PERFORMED
Amphetamines: NOT DETECTED
Barbiturates: NOT DETECTED
Benzodiazepines: NOT DETECTED
Cocaine: NOT DETECTED
Opiates: POSITIVE — AB
Tetrahydrocannabinol: NOT DETECTED

## 2014-05-18 LAB — PROTIME-INR
INR: 1.06 (ref 0.00–1.49)
Prothrombin Time: 13.9 seconds (ref 11.6–15.2)

## 2014-05-18 LAB — HEPARIN LEVEL (UNFRACTIONATED): Heparin Unfractionated: 0.32 IU/mL (ref 0.30–0.70)

## 2014-05-18 LAB — ANTITHROMBIN III: AntiThromb III Func: 98 % (ref 75–120)

## 2014-05-18 LAB — APTT: aPTT: 28 s (ref 24–37)

## 2014-05-18 LAB — CBC
HCT: 27.5 % — ABNORMAL LOW (ref 36.0–46.0)
Hemoglobin: 8.9 g/dL — ABNORMAL LOW (ref 12.0–15.0)
MCH: 30.5 pg (ref 26.0–34.0)
MCHC: 32.4 g/dL (ref 30.0–36.0)
MCV: 94.2 fL (ref 78.0–100.0)
Platelets: 277 10*3/uL (ref 150–400)
RBC: 2.92 MIL/uL — ABNORMAL LOW (ref 3.87–5.11)
RDW: 12.8 % (ref 11.5–15.5)
WBC: 7.3 10*3/uL (ref 4.0–10.5)

## 2014-05-18 LAB — ETHANOL: Alcohol, Ethyl (B): <5 mg/dL (ref 0–9)

## 2014-05-18 LAB — MAGNESIUM: Magnesium: 1.9 mg/dL (ref 1.5–2.5)

## 2014-05-18 MED ORDER — ALBUTEROL SULFATE HFA 108 (90 BASE) MCG/ACT IN AERS: 2.0000 | INHALATION_SPRAY | Freq: Four times a day (QID) | RESPIRATORY_TRACT | Status: DC | PRN

## 2014-05-18 MED ORDER — SODIUM CHLORIDE 0.9 % IJ SOLN
3.0000 mL | Freq: Two times a day (BID) | INTRAMUSCULAR | Status: DC
Start: 2014-05-18 — End: 2014-05-19
  Administered 2014-05-18 (×2): 3 mL via INTRAVENOUS

## 2014-05-18 MED ORDER — METHADONE HCL 10 MG PO TABS
60.0000 mg | ORAL_TABLET | Freq: Every day | ORAL | Status: DC
  Administered 2014-05-18 – 2014-05-19 (×2): 60 mg via ORAL
  Filled 2014-05-18 (×2): qty 6

## 2014-05-18 MED ORDER — WARFARIN SODIUM 5 MG PO TABS
5.0000 mg | ORAL_TABLET | Freq: Every day | ORAL | Status: DC
  Administered 2014-05-18: 5 mg via ORAL
  Filled 2014-05-18: qty 1

## 2014-05-18 MED ORDER — ACETAMINOPHEN 325 MG PO TABS
650.0000 mg | ORAL_TABLET | Freq: Four times a day (QID) | ORAL | Status: DC | PRN
  Administered 2014-05-18 (×2): 650 mg via ORAL
  Filled 2014-05-18 (×2): qty 2

## 2014-05-18 MED ORDER — WARFARIN VIDEO
Freq: Once | Status: AC
  Administered 2014-05-18: 16:00:00

## 2014-05-18 MED ORDER — POTASSIUM CHLORIDE IN NACL 20-0.9 MEQ/L-% IV SOLN
INTRAVENOUS | Status: DC
  Administered 2014-05-18 (×2): via INTRAVENOUS
  Filled 2014-05-18 (×3): qty 1000

## 2014-05-18 MED ORDER — PATIENT'S GUIDE TO USING COUMADIN BOOK
Freq: Once | Status: AC
  Administered 2014-05-18: 15:00:00
  Filled 2014-05-18: qty 1

## 2014-05-18 MED ORDER — IBUPROFEN 200 MG PO TABS
600.0000 mg | ORAL_TABLET | Freq: Four times a day (QID) | ORAL | Status: DC
  Administered 2014-05-18 (×3): 600 mg via ORAL
  Filled 2014-05-18 (×3): qty 3

## 2014-05-18 MED ORDER — METHADONE HCL 10 MG/ML PO CONC: 64.0000 mg | Freq: Every day | ORAL | Status: DC

## 2014-05-18 MED ORDER — ENOXAPARIN SODIUM 80 MG/0.8ML ~~LOC~~ SOLN
80.0000 mg | Freq: Two times a day (BID) | SUBCUTANEOUS | Status: DC
  Administered 2014-05-18 (×2): 80 mg via SUBCUTANEOUS
  Filled 2014-05-18 (×2): qty 0.8

## 2014-05-18 MED ORDER — ALBUTEROL SULFATE (2.5 MG/3ML) 0.083% IN NEBU: 2.5000 mg | INHALATION_SOLUTION | Freq: Four times a day (QID) | RESPIRATORY_TRACT | Status: DC | PRN

## 2014-05-18 MED ORDER — WARFARIN - PHARMACIST DOSING INPATIENT
Freq: Every day | Status: DC
  Administered 2014-05-18: 18:00:00

## 2014-05-18 NOTE — ED Notes (Signed)
Pt requesting to speak to PA; Delorise Jackson, PA informed

## 2014-05-18 NOTE — ED Notes (Signed)
Pt given Sprite, peanut butter, and graham crackers

## 2014-05-18 NOTE — Progress Notes (Signed)
Patient is refusing continuous IV fluids at this time. She claims her weakness in her right leg is getting better and will not use the walker as recommended when ambulating. Will continue to monitor. Cesar Alf, [place] Scrape RN

## 2014-05-18 NOTE — Clinical Social Work Note (Signed)
CSW consult acknowledged by [facility]. Please see RNCM documentation.   Clinical Social Worker will sign off for now as social work intervention is no longer needed. Please consult [place] again if new need arises.  Derenda Fennel, MSW, LCSWA [phone] 05/18/2014 11:54 AM

## 2014-05-18 NOTE — Progress Notes (Signed)
Patient watched coumadin education video, and coumadin booklet also given. RN will answer patient's questions and notify pharmacy if needed. Will continue to monitor. Marin Roberts RN

## 2014-05-18 NOTE — H&P (Signed)
Hospitalist Admission History and Physical  Patient name: Victoria Alvarez Medical record number: [id number] Date of birth: 1991-01-01 Age: 24 y.o. Gender: female  Primary Care Provider: LAND, PHILLIP, PA-C  Chief Complaint: hemiparesis   History of Present Illness:This is a 24 y.o. year old female with significant past medical history of polysubstance on chronic methadone, depression/anxiety, recent LTCS w/ child in NICU currently  presenting with hemiparesis. Pt states that she has had intermittent R sided weakness over past 2-3 days. States that she woke up this am w/ complete inability to use R leg. Denies any prior hx/o stroke/CVA in the past.  Presents to the ER afebrile, hemodynamically stable. Satting well on RA. WBC 10.3, hgb 9.6, K 3.2, Cr 0.68. CXR, Head CT WNL. 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 Neurology consulted recommending anticoagulation and hypercoaguable workup.   Assessment and Plan: Victoria Alvarez is a 24 y.o. year old female presenting with hemiparesis    Active Problems:   Hemiparesis   1- hemiparesis  -noted findings on imaging including cortical venous infarction, cortical vein thrombosis, partially thrombosed superior sagittal vein.  -heparin gtt per neuro recs -hypercoaguable pain  -MRI C, T, and L spine w/ and w/o contrast -f/u neuro recs -discussed case w/ OB-GYN attending (heparin and coumadin safe for breast feeding)  2-Methadone  -cont  -will need coordination w/ family and social work given breast feeding status and child currently in NICU  FEN/GI: heart healthy diet  Prophylaxis: heparin gtt Disposition: pending further evaluation  Code Status:Full Code    Patient Active Problem List   Diagnosis Date Noted  . Hemiparesis 05/18/2014  . Cephalic version 40/98/1191  . High risk  pregnancy due to maternal drug abuse 05/06/2014  . 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 [redacted] weeks gestation of pregnancy   . Decreased fetal movement during pregnancy, antepartum   . Pneumonia complicating pregnancy 03/06/2014  . Supervision of high risk pregnancy in third trimester 03/01/2014  . Substance abuse affecting pregnancy in third trimester, antepartum 03/01/2014  . Marijuana smoker 03/01/2014  . Current smoker 03/01/2014  . Hx of preeclampsia, prior pregnancy, currently pregnant 03/01/2014  . Pyelonephritis 10/27/2013  . Pyelonephritis affecting pregnancy in first trimester, antepartum 09/25/2013   Past Medical History: Past Medical History  Diagnosis Date  . Interstitial cystitis   . Chronic pelvic pain in female   . Headache(784.0)   . Infection     UTI  . Pyelonephritis affecting pregnancy in first trimester July 2015  . Depression 2007  . Substance abuse     Past Surgical History: Past Surgical History  Procedure Laterality Date  . Hernia repair    . Vagina surgery      tumor removed, went to cancer center in [facility]  . Cesarean section N/A 05/06/2014    Procedure: CESAREAN SECTION;  Surgeon: Levie Heritage, DO;  Location: [facility];  Service: Obstetrics;  Laterality: N/A;    Social History: History   Social History  . Marital Status: Single    Spouse Name: N/A  . Number of Children: N/A  . Years of Education: N/A   Social History Main Topics  . Smoking status: Current Every Day Smoker -- 0.50 packs/day for 8 years    Types: Cigarettes  . Smokeless tobacco: Not on file     Comment: cutting back  . Alcohol Use: No  .  Drug Use: No     Comment: Pt on Methadone  . Sexual Activity: Not Currently    Birth Control/ Protection: None   Other Topics Concern  . None   Social History Narrative    Family History: Family History  Problem Relation Age of Onset  . Diabetes Mother   . Heart disease Mother   .  Hypertension Father   . Diabetes Maternal Grandmother   . Hypertension Maternal Grandmother   . Stroke Maternal Grandmother   . Diabetes Maternal Grandfather   . Hypertension Maternal Grandfather   . Diabetes Paternal Grandmother   . Diabetes Paternal Grandfather     Allergies: Allergies  Allergen Reactions  . Ciprofloxacin Diarrhea and Nausea And Vomiting    Current Facility-Administered Medications  Medication Dose Route Frequency Provider Last Rate Last Dose  . 0.9 % NaCl with KCl 20 mEq/ L  infusion   Intravenous Continuous Floydene Flock, MD      . albuterol (PROVENTIL HFA;VENTOLIN HFA) 108 (90 BASE) MCG/ACT inhaler 2 puff  2 puff Inhalation Q6H PRN Floydene Flock, MD      . heparin ADULT infusion 100 units/mL (25000 units/250 mL)  1,100 Units/hr Intravenous Continuous Arby Barrette, MD 11 mL/hr at 05/17/14 2357 1,100 Units/hr at 05/17/14 2357  . ibuprofen (ADVIL,MOTRIN) tablet 600 mg  600 mg Oral 4 times per day Floydene Flock, MD      . methadone (DOLOPHINE) 10 MG/ML solution 64 mg  64 mg Oral Daily Floydene Flock, MD      . sodium chloride 0.9 % injection 3 mL  3 mL Intravenous Q12H Floydene Flock, MD       Current Outpatient Prescriptions  Medication Sig Dispense Refill  . albuterol (PROVENTIL HFA;VENTOLIN HFA) 108 (90 BASE) MCG/ACT inhaler Inhale 2 puffs into the lungs every 6 (six) hours as needed for wheezing or shortness of breath. 1 Inhaler 2  . ibuprofen (ADVIL,MOTRIN) 600 MG tablet Take 1 tablet (600 mg total) by mouth every 6 (six) hours. 30 tablet 0  . methadone (DOLOPHINE) 10 MG/ML solution Take 64 mg by mouth daily.     . ondansetron (ZOFRAN ODT) 8 MG disintegrating tablet Take 1 tablet (8 mg total) by mouth every 8 (eight) hours as needed for nausea or vomiting. 20 tablet 0  . oxybutynin (DITROPAN) 5 MG tablet Take 1 tablet (5 mg total) by mouth 2 (two) times daily. 60 tablet 3  . butalbital-acetaminophen-caffeine (FIORICET) 50-325-40 MG per tablet Take 1  tablet by mouth every 6 (six) hours as needed for headache. (Patient not taking: Reported on 05/17/2014) 20 tablet 0  . ferrous sulfate 325 (65 FE) MG tablet Take 1 tablet (325 mg total) by mouth 2 (two) times daily with a meal. (Patient not taking: Reported on 05/16/2014) 60 tablet 3  . ondansetron (ZOFRAN) 4 MG tablet Take 1 tablet (4 mg total) by mouth every 4 (four) hours as needed for nausea or vomiting. (Patient not taking: Reported on 05/17/2014) 20 tablet 0   Review Of Systems: 12 point ROS negative except as noted above in HPI.  Physical Exam: Filed Vitals:   05/18/14 0000  BP: 106/62  Pulse: 85  Temp:   Resp:     General: alert and cooperative HEENT: PERRLA and extra ocular movement intact Heart: S1, S2 normal, no murmur, rub or gallop, regular rate and rhythm Lungs: clear to auscultation, no wheezes or rales and unlabored breathing Abdomen: abdomen is soft without significant tenderness, masses, organomegaly  or guarding Extremities: decreased ROM and strength-moderate, on R sided Skin:no rashes Neurology: significant weakness on R UE and LE, sensation intact. Otherwise grossly normal.   Labs and Imaging: Lab Results  Component Value Date/Time   NA 139 05/17/2014 05:28 PM   K 3.2* 05/17/2014 05:28 PM   CL 107 05/17/2014 05:28 PM   CO2 23 05/17/2014 05:28 PM   BUN 13 05/17/2014 05:28 PM   CREATININE 0.68 05/17/2014 05:28 PM   GLUCOSE 91 05/17/2014 05:28 PM   Lab Results  Component Value Date   WBC 10.3 05/17/2014   HGB 9.6* 05/17/2014   HCT 28.5* 05/17/2014   MCV 91.3 05/17/2014   PLT 325 05/17/2014    Dg Chest 2 View  05/17/2014   CLINICAL DATA:  Wheezing  EXAM: CHEST  2 VIEW  COMPARISON:  03/06/2014  FINDINGS: Lungs are clear.  No pleural effusion or pneumothorax.  The heart is normal in size.  Visualized osseous structures are within normal limits.  IMPRESSION: Normal chest radiographs.   Electronically Signed   By: Charline Bills M.D.   On: 05/17/2014 18:22    Ct Head Wo Contrast  05/17/2014   CLINICAL DATA:  Numbness  EXAM: CT HEAD WITHOUT CONTRAST  TECHNIQUE: Contiguous axial images were obtained from the base of the skull through the vertex without intravenous contrast.  COMPARISON:  06/01/2008  FINDINGS: No evidence of parenchymal hemorrhage or extra-axial fluid collection. No mass lesion, mass effect, or midline shift.  No CT evidence of acute infarction.  Cerebral volume is within normal limits.  No ventriculomegaly.  The visualized paranasal sinuses are essentially clear. The mastoid air cells are unopacified.  No evidence of calvarial fracture.  IMPRESSION: Normal head CT.   Electronically Signed   By: Charline Bills M.D.   On: 05/17/2014 18:11   Mr Laqueta Jean ZO Contrast  05/17/2014   CLINICAL DATA:  RIGHT-sided paresthesia for 5 minutes, beginning 2 days ago with residual weakness, 1 day of Headache, RIGHT arm weakness. Postpartum patient, history of infection and substance abuse.  EXAM: MRI HEAD WITH CONTRAST  MRV HEAD WITHOUT CONTRAST  TECHNIQUE: Multiplanar, multiecho pulse sequences of the brain and surrounding structures were obtained without and with intravenous contrast. Angiographic images of the head were obtained using MRV technique without contrast. MIP images provided.  CONTRAST:  15mL MULTIHANCE GADOBENATE DIMEGLUMINE 529 MG/ML IV SOLN  COMPARISON:  CT of the head May 17, 2014 at 1802 hours  FINDINGS: MRI HEAD FINDINGS  Faint area of approximately 2.6 cm reduced diffusion in LEFT posterior frontal cortex at the convexity, with poor corresponding low ADC values. Mildly expansile T2 hyperintense signal resulting in mild sulcal effacement without midline shift. Curvilinear susceptibility artifact within high LEFT frontal extra-axial space, axial 23 and 24/24 highly concerning for cortical vein thrombosis. Equivocal loss of the superior sagittal sinus flow void focally on sagittal T1 12/23. No abnormal parenchymal enhancement. Heterogeneous  enhancement of the superior sagittal sinus.  Ventricles and sulci are otherwise normal for patient's age. No midline shift. No satellite areas of abnormal signal are reduced diffusion. No abnormal extra-axial fluid collections. Normal major intracranial vascular flow voids seen at the skull base.  Ocular globes and orbital contents are unremarkable. Paranasal sinuses mastoid air cells well-aerated. No abnormal sellar expansion. No cerebellar tonsillar ectopia. No suspicious calvarial bone marrow signal.  MRV HEAD FINDINGS  Irregular poor flow related enhancement of the mid superior sagittal sinus, the anterior and more proximal segments are widely patent. RIGHT transverse  sinus is dominant but both are patent with normal appearance of the sagittal sinus and included internal cerebral veins.  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 Corresponding cortical vein thrombosis.  MRA HEAD: Partially thrombosed superior sagittal sinus.   Electronically Signed   By: Awilda Metro   On: 05/17/2014 23:03   Mr Venogram Head  05/17/2014   CLINICAL DATA:  RIGHT-sided paresthesia for 5 minutes, beginning 2 days ago with residual weakness, 1 day of Headache, RIGHT arm weakness. Postpartum patient, history of infection and substance abuse.  EXAM: MRI HEAD WITH CONTRAST  MRV HEAD WITHOUT CONTRAST  TECHNIQUE: Multiplanar, multiecho pulse sequences of the brain and surrounding structures were obtained without and with intravenous contrast. Angiographic images of the head were obtained using MRV technique without contrast. MIP images provided.  CONTRAST:  61mL MULTIHANCE GADOBENATE DIMEGLUMINE 529 MG/ML IV SOLN  COMPARISON:  CT of the head May 17, 2014 at 1802 hours  FINDINGS: MRI HEAD FINDINGS  Faint area of approximately 2.6 cm reduced diffusion in LEFT posterior frontal cortex at the convexity, with poor corresponding low ADC values. Mildly expansile T2  hyperintense signal resulting in mild sulcal effacement without midline shift. Curvilinear susceptibility artifact within high LEFT frontal extra-axial space, axial 23 and 24/24 highly concerning for cortical vein thrombosis. Equivocal loss of the superior sagittal sinus flow void focally on sagittal T1 12/23. No abnormal parenchymal enhancement. Heterogeneous enhancement of the superior sagittal sinus.  Ventricles and sulci are otherwise normal for patient's age. No midline shift. No satellite areas of abnormal signal are reduced diffusion. No abnormal extra-axial fluid collections. Normal major intracranial vascular flow voids seen at the skull base.  Ocular globes and orbital contents are unremarkable. Paranasal sinuses mastoid air cells well-aerated. No abnormal sellar expansion. No cerebellar tonsillar ectopia. No suspicious calvarial bone marrow signal.  MRV HEAD FINDINGS  Irregular poor flow related enhancement of the mid superior sagittal sinus, the anterior and more proximal segments are widely patent. RIGHT transverse sinus is dominant but both are patent with normal appearance of the sagittal sinus and included internal cerebral veins.  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 Corresponding cortical vein thrombosis.  MRA HEAD: Partially thrombosed superior sagittal sinus.   Electronically Signed   By: Awilda Metro   On: 05/17/2014 23:03           Doree Albee MD  Pager: [phone]

## 2014-05-18 NOTE — Progress Notes (Signed)
STROKE TEAM PROGRESS NOTE   HISTORY Victoria Alvarez is a 24 y/o female with a past medical history that is significant for depression, substance abuse, HA, s/p C-section 05/06/14, present for further evaluation of right sided weakness-paresthesias. Patient indicated that 2 days ago she experienced sudden onset of numbness-tingling from the right hip to the right toes that lasted for approximately 5 minutes and resolved but left her with a heavy/weak sensation in her right foot that was most prominent when she was driving and pressing on the gas pedal. Then, yesterday 05/16/2014 she developed a HA that is still present and started having numbness and tingling around her right breast, right flank area, moving up to the right ear, and in the past coupe of hours she has been weak in the right leg and can not move the right foot. She also endorses some weakness of the right arm. Denies vertigo, double vision, slurred speech, language or visual impairment, bladder or bowel incontinence. No recent fever, infection, skin rah, vaccinations, trauma, or foreign travel. CT brain was reviewed and showed no acute abnormality.  Her last known well is uncertain. Patient was not administered TPA secondary to delay in arrival. She was admitted for further evaluation and treatment.   SUBJECTIVE (INTERVAL HISTORY) No family is at the bedside.  Overall she feels her condition is gradually improving. She does complain of a headache. Her child remains in NICU. She reports she is breast feeding.   OBJECTIVE Temp:  [97.9 F (36.6 C)-98.9 F (37.2 C)] 98.4 F (36.9 C) (03/09 1016) Pulse Rate:  [65-87] 84 (03/09 1016) Cardiac Rhythm:  [-]  Resp:  [14-26] 18 (03/09 1016) BP: (106-144)/(57-97) 123/62 mmHg (03/09 1016) SpO2:  [95 %-100 %] 98 % (03/09 1016) Weight:  [78.019 kg (172 lb)] 78.019 kg (172 lb) (03/08 1705)  No results for input(s): GLUCAP in the last 168 hours.  Recent Labs Lab 05/16/14 2225 05/17/14 1728  05/18/14 0023 05/18/14 0100  NA 139 139  --  137  K 3.2* 3.2*  --  3.3*  CL 104 107  --  108  CO2 26 23  --  20  GLUCOSE 104* 91  --  95  BUN 13 13  --  10  CREATININE 0.67 0.68  --  0.68  CALCIUM 9.1 9.3  --  8.2*  MG  --  1.9 1.9  --     Recent Labs Lab 05/16/14 2225 05/17/14 1728 05/18/14 0100  AST 16 16 13   ALT 16 16 13   ALKPHOS 105 92 75  BILITOT 0.7 0.5 0.5  PROT 6.0 5.7* 5.0*  ALBUMIN 3.2* 3.0* 2.6*    Recent Labs Lab 05/16/14 2225 05/17/14 1728 05/18/14 0100 05/18/14 0837  WBC 7.8 10.3 7.3 7.3  NEUTROABS 4.2 6.6 3.3  --   HGB 9.7* 9.6* 8.2* 8.9*  HCT 28.9* 28.5* 25.2* 27.5*  MCV 92.3 91.3 92.6 94.2  PLT 302 325 263 277   No results for input(s): CKTOTAL, CKMB, CKMBINDEX, TROPONINI in the last 168 hours.  Recent Labs  05/17/14 2348  LABPROT 13.9  INR 1.06    Recent Labs  05/16/14 2140  COLORURINE YELLOW  LABSPEC 1.020  PHURINE 5.5  GLUCOSEU NEGATIVE  HGBUR MODERATE*  BILIRUBINUR NEGATIVE  KETONESUR NEGATIVE  PROTEINUR NEGATIVE  UROBILINOGEN 0.2  NITRITE NEGATIVE  LEUKOCYTESUR SMALL*    No results found for: CHOL, TRIG, HDL, CHOLHDL, VLDL, LDLCALC No results found for: HGBA1C    Component Value Date/Time   LABOPIA POSITIVE* 05/18/2014  [place] 03/01/2014 [place] DETECTED 05/18/2014 0823   COCAINSCRNUR NEG 03/01/2014 1006   LABBENZ NONE DETECTED 05/18/2014 0823   LABBENZ NEG 03/01/2014 1006   AMPHETMU NONE DETECTED 05/18/2014 0823   AMPHETMU NEG 03/01/2014 1006   THCU NONE DETECTED 05/18/2014 0823   THCU PPS 03/01/2014 1006   THCU 42* 03/01/2014 1006   LABBARB NONE DETECTED 05/18/2014 0823   LABBARB NEG 03/01/2014 1006     Recent Labs Lab 05/18/14 0837  ETH <5    Dg Chest 2 View  05/17/2014   CLINICAL DATA:  Wheezing  EXAM: CHEST  2 VIEW  COMPARISON:  03/06/2014  FINDINGS: Lungs are clear.  No pleural effusion or pneumothorax.  The heart is normal in size.  Visualized osseous structures are within  normal limits.  IMPRESSION: Normal chest radiographs.   Electronically Signed   By: Julian Hy M.D.   On: 05/17/2014 18:22   Ct Head Wo Contrast  05/17/2014   CLINICAL DATA:  Numbness  EXAM: CT HEAD WITHOUT CONTRAST  TECHNIQUE: Contiguous axial images were obtained from the base of the skull through the vertex without intravenous contrast.  COMPARISON:  06/01/2008  FINDINGS: No evidence of parenchymal hemorrhage or extra-axial fluid collection. No mass lesion, mass effect, or midline shift.  No CT evidence of acute infarction.  Cerebral volume is within normal limits.  No ventriculomegaly.  The visualized paranasal sinuses are essentially clear. The mastoid air cells are unopacified.  No evidence of calvarial fracture.  IMPRESSION: Normal head CT.   Electronically Signed   By: Julian Hy M.D.   On: 05/17/2014 18:11   Mr Jeri Cos YP Contrast  05/17/2014   CLINICAL DATA:  RIGHT-sided paresthesia for 5 minutes, beginning 2 days ago with residual weakness, 1 day of Headache, RIGHT arm weakness. Postpartum patient, history of infection and substance abuse.  EXAM: MRI HEAD WITH CONTRAST  MRV HEAD WITHOUT CONTRAST  TECHNIQUE: Multiplanar, multiecho pulse sequences of the brain and surrounding structures were obtained without and with intravenous contrast. Angiographic images of the head were obtained using MRV technique without contrast. MIP images provided.  CONTRAST:  52m MULTIHANCE GADOBENATE DIMEGLUMINE 529 MG/ML IV SOLN  COMPARISON:  CT of the head May 17, 2014 at 1802 hours  FINDINGS: MRI HEAD FINDINGS  Faint area of approximately 2.6 cm reduced diffusion in LEFT posterior frontal cortex at the convexity, with poor corresponding low ADC values. Mildly expansile T2 hyperintense signal resulting in mild sulcal effacement without midline shift. Curvilinear susceptibility artifact within high LEFT frontal extra-axial space, axial 23 and 24/24 highly concerning for cortical vein thrombosis. Equivocal  loss of the superior sagittal sinus flow void focally on sagittal T1 12/23. No abnormal parenchymal enhancement. Heterogeneous enhancement of the superior sagittal sinus.  Ventricles and sulci are otherwise normal for patient's age. No midline shift. No satellite areas of abnormal signal are reduced diffusion. No abnormal extra-axial fluid collections. Normal major intracranial vascular flow voids seen at the skull base.  Ocular globes and orbital contents are unremarkable. Paranasal sinuses mastoid air cells well-aerated. No abnormal sellar expansion. No cerebellar tonsillar ectopia. No suspicious calvarial bone marrow signal.  MRV HEAD FINDINGS  Irregular poor flow related enhancement of the mid superior sagittal sinus, the anterior and more proximal segments are widely patent. RIGHT transverse sinus is dominant but both are patent with normal appearance of the sagittal sinus and included internal cerebral veins.  IMPRESSION: MRI HEAD: Acute small high LEFT frontal  cortical venous infarction without hemorrhagic conversion. Corresponding cortical vein thrombosis.  MRA HEAD: Partially thrombosed superior sagittal sinus.   Electronically Signed   By: Elon Alas   On: 05/17/2014 23:03   Mr Venogram Head  05/17/2014   CLINICAL DATA:  RIGHT-sided paresthesia for 5 minutes, beginning 2 days ago with residual weakness, 1 day of Headache, RIGHT arm weakness. Postpartum patient, history of infection and substance abuse.  EXAM: MRI HEAD WITH CONTRAST  MRV HEAD WITHOUT CONTRAST  TECHNIQUE: Multiplanar, multiecho pulse sequences of the brain and surrounding structures were obtained without and with intravenous contrast. Angiographic images of the head were obtained using MRV technique without contrast. MIP images provided.  CONTRAST:  42m MULTIHANCE GADOBENATE DIMEGLUMINE 529 MG/ML IV SOLN  COMPARISON:  CT of the head May 17, 2014 at 1802 hours  FINDINGS: MRI HEAD FINDINGS  Faint area of approximately 2.6 cm reduced  diffusion in LEFT posterior frontal cortex at the convexity, with poor corresponding low ADC values. Mildly expansile T2 hyperintense signal resulting in mild sulcal effacement without midline shift. Curvilinear susceptibility artifact within high LEFT frontal extra-axial space, axial 23 and 24/24 highly concerning for cortical vein thrombosis. Equivocal loss of the superior sagittal sinus flow void focally on sagittal T1 12/23. No abnormal parenchymal enhancement. Heterogeneous enhancement of the superior sagittal sinus.  Ventricles and sulci are otherwise normal for patient's age. No midline shift. No satellite areas of abnormal signal are reduced diffusion. No abnormal extra-axial fluid collections. Normal major intracranial vascular flow voids seen at the skull base.  Ocular globes and orbital contents are unremarkable. Paranasal sinuses mastoid air cells well-aerated. No abnormal sellar expansion. No cerebellar tonsillar ectopia. No suspicious calvarial bone marrow signal.  MRV HEAD FINDINGS  Irregular poor flow related enhancement of the mid superior sagittal sinus, the anterior and more proximal segments are widely patent. RIGHT transverse sinus is dominant but both are patent with normal appearance of the sagittal sinus and included internal cerebral veins.  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 Corresponding cortical vein thrombosis.  MRA HEAD: Partially thrombosed superior sagittal sinus.   Electronically Signed   By: CElon Alas  On: 05/17/2014 23:03     PHYSICAL EXAM Obese young Caucasian lady not in distress.. . Afebrile. Head is nontraumatic. Neck is supple without bruit.    Cardiac exam no murmur or gallop. Lungs are clear to auscultation. Distal pulses are well felt. Neurological Exam ;  Awake  Alert oriented x 3. Normal speech and language.eye movements full without nystagmus.fundi were not visualized. Vision  acuity and fields appear normal. Hearing is normal. Palatal movements are normal. Face symmetric. Tongue midline. Normal strength, tone, reflexes and coordination. Except for mild giveaway weakness of the right hip flexors Normal sensation on objective testing but subjective paresthesias in the right upper extremity.. Gait deferred. ASSESSMENT/PLAN Ms. KGilma Bessetteis a 23y.o. female with history of depression, substance abuse, HA, s/p C-section 05/06/14  presenting with right sided weakness-paresthesias. She did not receive IV t-PA due to delay in arrival.   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  IV heparin for VTE prophylaxis  Diet Heart thin  liquids  no antithrombotic prior to admission, now on IV heparin. As she is breast feeding, will consult pharmacy to determine best anticoagulation choice for her, ? Full dose lovenox. If coumadin ok, will need full dose lovenox bridge.   Stay hydrated - encouarged to drink at minimal 8 glasses of liquids daily  Continue IVF  NO OCPs or estrogens for birth control  Therapy recommendations:  OP PT, HH OT  Disposition:  Home. Recommend OP therapies  Hypertension  Stable  Other Stroke Risk Factors  Cigarette smoker, advised to stop smoking  THC use  Hx drug use, currently on methadone  Family history of stroke (maternal grandmother)  Obesity, Body mass index is 30.48 kg/(m^2).   Hospital day # 0  Radene Journey [facility] for Pager information 05/18/2014 6:42 PM  I have personally examined this patient, reviewed notes, independently viewed imaging studies,  participated in medical decision making and plan of care. I have made any additions or clarifications directly to the above note. Agree with note above. She has had 2 weeks of postpartum headache with 3 days of right hand paresthesias likely from a small subacute left frontoparietal parasagittal venous infarct from cortical vein thrombosis. MR venogram also shows a focal clot in the superior sagittal sinus which is nonocclusive hence we will need to treat her with anticoagulation for at least 6 months. She remains at risk for increase in neurological symptoms, deficits and needs aggressive anticoagulation and treatment. I counseled the patient to maintain aggressive hydration status and to avoid birth control pills and smoking. Will likely discharge tomorrow after initiating warfarin with 3 days of overlapping bridging with full dose Lovenox  Antony Contras, MD Medical Director [facility] Pager: [phone] 05/18/2014 7:59 PM    To contact Stroke Continuity provider, please refer to [email or web address] After hours, contact General Neurology

## 2014-05-18 NOTE — Progress Notes (Signed)
Patient seen and examined. Admitted after midnight secondary to right side weakness (Leg > arm) and numbness sensation. Found to have cerebral thrombosis with infarction. Patient with recent C-section most likely experiencing hypercoagulable state from pregnancy; although she had a hx of risk factors including recreational drugs and smoking. Per neurology will need anticoagulation for 3 months and subsequent aspirin for secondary prevention.  Plan: -bridging therapy with lovenox and coumadin -continue supportive care -follow PT/OT eval and rec's  Barton Dubois [phone]

## 2014-05-18 NOTE — Evaluation (Signed)
Physical Therapy Evaluation Patient Details Name: Victoria Alvarez MRN: [id number] DOB: [birth year] Today's Date: 05/18/2014   History of Present Illness  Patient is a 24 y/o female admitted due to Right sided weakness/numbness. 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 Patient with recent C-section 2/26 (baby in NICU) most likely experiencing hypercoagulable state from pregnancy; although she has a hx of risk factors including recreational drugs and smoking. Goes to methadone clinic.    Clinical Impression  Patient presents with weakness through RLE impacting balance and mobility. Used RW for ambulation today to assist with balance and stability. Decreased foot clearance RLE worsened when fatigued. Education provided on safety concerns with fall risk, esp when holding new born etc. Pt has b/f who can assist at home.Pt would benefit from skilled PT to improve overall functional mobility and minimize fall risk prior to return home.     Follow Up Recommendations Outpatient PT;Supervision for mobility/OOB    Equipment Recommendations  Other (comment) (TBD.)    Recommendations for Other Services       Precautions / Restrictions Precautions Precautions: Fall Restrictions Weight Bearing Restrictions: No      Mobility  Bed Mobility Overal bed mobility: Needs Assistance Bed Mobility: Supine to Sit;Sit to Supine     Supine to sit: Modified independent (Device/Increase time);HOB elevated Sit to supine: Modified independent (Device/Increase time);HOB elevated   General bed mobility comments: Use of rails.   Transfers Overall transfer level: Needs assistance Equipment used: None Transfers: Sit to/from Stand Sit to Stand: Supervision         General transfer comment: Supervision for safety.  Ambulation/Gait Ambulation/Gait assistance: Min guard Ambulation Distance (Feet): 150 Feet Assistive device: Rolling walker (2 wheeled) Gait Pattern/deviations:  Step-through pattern;Decreased stride length;Decreased dorsiflexion - right     General Gait Details: Pt with decreased foot clearance RLE utilizing hip IR and mild circumduction to advance limb. Trembling noted Right knee most likely due to fatigue with pt abruptly requesting to sit down.   Stairs            Wheelchair Mobility    Modified Rankin (Stroke Patients Only) Modified Rankin (Stroke Patients Only) Pre-Morbid Rankin Score: No symptoms Modified Rankin: Moderately severe disability     Balance Overall balance assessment: Needs assistance Sitting-balance support: Feet supported;No upper extremity supported Sitting balance-Leahy Scale: Good     Standing balance support: During functional activity Standing balance-Leahy Scale: Fair                               Pertinent Vitals/Pain Pain Assessment: No/denies pain    Home Living Family/patient expects to be discharged to:: Private residence Living Arrangements: Spouse/significant other;Children Available Help at Discharge: Family;Available 24 hours/day Type of Home: House Home Access: Stairs to enter Entrance Stairs-Rails: Right Entrance Stairs-Number of Steps: 2 Home Layout: One level Home Equipment: None      Prior Function Level of Independence: Independent         Comments: Pt has 12 y/o daughter and new born in NICU. B/f is available to assist - not working full time right now.     Hand Dominance        Extremity/Trunk Assessment   Upper Extremity Assessment: Defer to OT evaluation           Lower Extremity Assessment: RLE deficits/detail;Generalized weakness RLE Deficits / Details: Grossly ~2+/5 knee extension, 0/5 DF, 2+/5 hip flexion, 2+/5 knee flexion.  Cervical / Trunk Assessment: Normal  Communication   Communication: No difficulties  Cognition Arousal/Alertness: Awake/alert Behavior During Therapy: WFL for tasks assessed/performed Overall Cognitive Status:  Within Functional Limits for tasks assessed                      General Comments      Exercises        Assessment/Plan    PT Assessment Patient needs continued PT services  PT Diagnosis Difficulty walking;Generalized weakness   PT Problem List Decreased strength;Decreased range of motion;Impaired sensation;Decreased balance;Decreased mobility  PT Treatment Interventions Balance training;Gait training;Functional mobility training;Patient/family education;Therapeutic activities;Therapeutic exercise;Stair training;DME instruction   PT Goals (Current goals can be found in the Care Plan section) Acute Rehab PT Goals Patient Stated Goal: to be reunited with my baby PT Goal Formulation: With patient Time For Goal Achievement: 06/01/14 Potential to Achieve Goals: Good    Frequency Min 4X/week   Barriers to discharge        Co-evaluation               End of Session Equipment Utilized During Treatment: Gait belt Activity Tolerance: Patient tolerated treatment well Patient left: in bed;with call bell/phone within reach Nurse Communication: Mobility status         Time: 1132-1150 PT Time Calculation (min) (ACUTE ONLY): 18 min   Charges:   PT Evaluation $Initial PT Evaluation Tier I: 1 Procedure     PT G CodesAlvie Heidelberg A 06-03-14, 11:58 AM Alvie Heidelberg, PT, DPT [phone]

## 2014-05-18 NOTE — Progress Notes (Signed)
Chaplain referred to pt from [facility]. Pt explained that she has a son in NICU and a daughter at home. Pt expressed that her greatest desire is "to have my family again." Pt narrated some of her life's events and pt and chaplain explored feelings of anger. Chaplain will continue to follow. Page chaplain if needed before follow up.   05/18/14 1400  Clinical Encounter Type  Visited With Patient  Visit Type Initial;Spiritual support  Referral From Nurse  Spiritual Encounters  Spiritual Needs Emotional  Stress Factors  Patient Stress Factors Family relationships  Gaylin Osoria, Mayer Masker, Chaplain 05/18/2014 2:03 PM

## 2014-05-18 NOTE — ED Notes (Signed)
Pt concerned for admission, reports wanting methadone prescribed before being admitted. Pt informed of methadone orders and social work consult for breastfeeding

## 2014-05-18 NOTE — Progress Notes (Addendum)
ANTICOAGULATION CONSULT NOTE   Pharmacy Consult for Heparin --> Coumadin / Lovenox Indication:  sinus thrombosis  Allergies  Allergen Reactions  . Ciprofloxacin Diarrhea and Nausea And Vomiting    Labs:  Recent Labs  05/16/14 2225 05/17/14 1728 05/17/14 2348 05/18/14 0100 05/18/14 0837  HGB 9.7* 9.6*  --  8.2* 8.9*  HCT 28.9* 28.5*  --  25.2* 27.5*  PLT 302 325  --  263 277  APTT  --   --  28  --   --   LABPROT  --   --  13.9  --   --   INR  --   --  1.06  --   --   HEPARINUNFRC  --   --   --   --  0.32  CREATININE 0.67 0.68  --  0.68  --     Estimated Creatinine Clearance: 107.2 mL/min (by C-G formula based on Cr of 0.68).  Assessment: 24 yo female with acute frontal cortical venous infarction, s/p C-section 2/26, previously on heparin now to be switched to Lovenox and Coumadin  Goal of Therapy:  INR = 2 to 3 Appropriate Lovenox dosing Monitor platelets by anticoagulation protocol: Yes   Plan:  Lovenox 80 mg sq Q 12 hours DC Heparin after Lovenox given Coumadin 5 mg po daily at 1800 pm Coumadin education materials Daily INR  Thank you. Okey Regal, PharmD [phone] 05/18/2014,10:01 AM

## 2014-05-18 NOTE — Care Management Note (Signed)
    Page 1 of 2   05/19/2014     11:19:34 AM CARE MANAGEMENT NOTE 05/19/2014  Patient:  Victoria Alvarez   Account Number:  [id number]  Date Initiated:  05/18/2014  Documentation initiated by:  Lorne Skeens  Subjective/Objective Assessment:   Patient was admitted with right-sided numbness, inability to ambulate. S/P c-section on 05/06/14. Patient lives at home with significant other and 25 year old child.     Action/Plan:   Will follow for discharge needs.   Anticipated DC Date:     Anticipated DC Plan:  [place]  CM consult  [facility]      Choice offered to / List presented to:     DME arranged  TUB BENCH      DME agency  [place] arranged  HH-1 RN  [place].   Status of service:  Completed, signed off Medicare Important Message given?   (If response is "NO", the following Medicare IM given date fields will be blank) Date Medicare IM given:   Medicare IM given by:   Date Additional Medicare IM given:   Additional Medicare IM given by:    Discharge Disposition:  [place]  Per UR Regulation:  Reviewed for med. necessity/level of care/duration of stay  If discussed at [place] of Stay Meetings, dates discussed:    Comments:  05/19/14 [place], MSN, CM- Per benefits check, Lovenox is on the [facility] approved list with a $3 copay.  Patient has an appointment on 05/20/14 at 1400 at the [facility].  Patient has chosen [facility] to follow for [facility] (PT/INR draws until patient can be seen in the [facility] Coumadin clinic) and HHPT.  Patient was instructed to call Medicaid to have her current clinic removed from her Medicaid card prior to her appointment.  Patient can be reached at [phone] or her s/o Nate at [phone]. Miranda with [facility] was notified and has accepted the referral for discharge home today.   05/19/14 0945 Lorne Skeens  RN, MSN, CM- Met with patient to discuss discharge needs. Patient does have a PCP, Dr Jenita Seashore in [place], but would like to follow with someone in [place] instead.  CM attempting to reach the [facility] to see if an appointment could be made to establish care and monitor Coumadin.  Patient will be discharging home on Lovenox $RemoveBe'120mg'ZqziAVNlR$  daily x 5 days. Benefits check requested to see if medication is covered under Medicaid.  [facility] DME notified of need for tub bench prior to discharge home today.  Awaiting return call from [facility] and benefits check results.   05/18/14 [place], MSN, CM- Consult noted regarding patient's methodone use and breastfeeding. Patient is currently pumping and breastmilk is being taken to [facility] by patient's significant other. Bedside RN has addressed the situation with patient and has also spoken with NICU RN currently caring for the baby. Patient is followed by the pain clinic for her methadone dosing. CM will continue to follow for any additional discharge needs.

## 2014-05-18 NOTE — Progress Notes (Signed)
UR complete.  Illona Bulman RN, MSN 

## 2014-05-18 NOTE — ED Notes (Signed)
Spoke to MRI tech; reports patient has received contrast for previous MRI study, must wait 24 hours before pending orders can be completed

## 2014-05-18 NOTE — Progress Notes (Signed)
Patient arrived to 4N01 from ED. Patient ambulated to restroom and tolerated it well, left leg dragging when walking. Telemetry applied. Patient oriented to room and unit. Will continue to monitor patient closely.  Monia Pouch, RN

## 2014-05-18 NOTE — Evaluation (Signed)
Occupational Therapy Evaluation Patient Details Name: Jameka Gornto MRN: [id number] DOB: 12/01/1990 Today's Date: 05/18/2014    History of Present Illness Patient is a 24 y/o female admitted due to Right sided weakness/numbness. 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 Patient with recent C-section 2/26 (baby in NICU) most likely experiencing hypercoagulable state from pregnancy; although she has a hx of risk factors including recreational drugs and smoking. Goes to methadone clinic.   Clinical Impression   Pt admitted with above. She demonstrates the below listed deficits and will benefit from continued OT to maximize safety and independence with BADLs.  OT eval limited due to onset of severe headache with visual assessment.  Pt. Demonstrates Rt UE strength 4/5, impaired balance, impaired activity tolerance.  Currently, she is able to perform BADLs with min guard to min A.  She will require assist with child care at discharge.  She states that he boyfriend, sister, and friend can assist, but anticipate she will fatigue quickly and will require a significant amount of assist.  Recommend HHOT.       Follow Up Recommendations  Home health OT;Supervision - Intermittent    Equipment Recommendations  Tub/shower bench    Recommendations for Other Services       Precautions / Restrictions Precautions Precautions: Fall Restrictions Weight Bearing Restrictions: No      Mobility Bed Mobility Overal bed mobility: Modified Independent Bed Mobility: Supine to Sit;Sit to Supine     Supine to sit: Modified independent (Device/Increase time);HOB elevated Sit to supine: Modified independent (Device/Increase time);HOB elevated   General bed mobility comments: Use of rails.   Transfers Overall transfer level: Needs assistance Equipment used: None Transfers: Sit to/from Stand Sit to Stand: Supervision         General transfer comment: Supervision for safety.     Balance Overall balance assessment: Needs assistance Sitting-balance support: Feet supported;No upper extremity supported Sitting balance-Leahy Scale: Good     Standing balance support: During functional activity Standing balance-Leahy Scale: Fair                              ADL Overall ADL's : Needs assistance/impaired Eating/Feeding: Independent;Sitting   Grooming: Wash/dry hands;Wash/dry face;Oral care;Brushing hair;Set up;Sitting   Upper Body Bathing: Set up;Sitting   Lower Body Bathing: Min guard;Sit to/from stand   Upper Body Dressing : Set up;Sitting   Lower Body Dressing: Min guard;Sit to/from stand   Toilet Transfer: Min guard   Toileting- Architect and Hygiene: Min guard;Sit to/from stand       Functional mobility during ADLs: Min guard;Rolling walker General ADL Comments: Pt with onset severe headache during visual assessment      Vision Vision Assessment?: Yes Eye Alignment: Within Functional Limits Ocular Range of Motion: Within Functional Limits Alignment/Gaze Preference: Within Defined Limits Tracking/Visual Pursuits: Able to track stimulus in all quads without difficulty Saccades: Within functional limits Convergence: Within functional limits Visual Fields: No apparent deficits Additional Comments: Pt with onset of severe headache with visual assessment    Perception Perception Perception Tested?: Yes   Praxis Praxis Praxis tested?: Within functional limits    Pertinent Vitals/Pain Pain Assessment: 0-10 Pain Score: 9  Pain Location: headache - onset with visual assessment  Pain Descriptors / Indicators: Aching Pain Intervention(s): Limited activity within patient's tolerance;Repositioned;Patient requesting pain meds-RN notified     Hand Dominance Right   Extremity/Trunk Assessment Upper Extremity Assessment Upper Extremity Assessment: RUE deficits/detail RUE Deficits /  Details: 4/5   Lower Extremity  Assessment Lower Extremity Assessment: Defer to PT evaluation RLE Deficits / Details: Grossly ~2+/5 knee extension, 0/5 DF, 2+/5 hip flexion, 2+/5 knee flexion.  RLE Sensation: decreased light touch;decreased proprioception   Cervical / Trunk Assessment Cervical / Trunk Assessment: Normal   Communication Communication Communication: No difficulties   Cognition Arousal/Alertness: Awake/alert Behavior During Therapy: Flat affect Overall Cognitive Status: Within Functional Limits for tasks assessed (very limited eval due to headache`)                     General Comments       Exercises       Shoulder Instructions      Home Living Family/patient expects to be discharged to:: Private residence Living Arrangements: Spouse/significant other;Children Available Help at Discharge: Family;Available 24 hours/day;Friend(s) Type of Home: House Home Access: Stairs to enter [employer] of Steps: 2 Entrance Stairs-Rails: Right Home Layout: One level     Bathroom Shower/Tub: Tub/shower unit Shower/tub characteristics: Engineer, building services: Standard     Home Equipment: None   Additional Comments: Pt reports that boyfriend, pt's friend, and her sister will be able to provide 24 hour assist, and assist with child care       Prior Functioning/Environment Level of Independence: Independent        Comments: Pt has 30 y/o daughter and new born in NICU. B/f is available to assist - not working full time right now.    OT Diagnosis: Generalized weakness;Hemiplegia dominant side   OT Problem List: Decreased strength;Decreased activity tolerance;Impaired balance (sitting and/or standing);Decreased knowledge of use of DME or AE;Pain;Impaired UE functional use   OT Treatment/Interventions: Self-care/ADL training;Therapeutic exercise;DME and/or AE instruction;Therapeutic activities;Patient/family education;Balance training    OT Goals(Current goals can be found in the  care plan section) Acute Rehab OT Goals Patient Stated Goal: To get better  OT Goal Formulation: With patient Time For Goal Achievement: 05/25/14 Potential to Achieve Goals: Good ADL Goals Pt Will Perform Upper Body Bathing: with modified independence;sitting Pt Will Perform Lower Body Bathing: with modified independence;sit to/from stand Pt Will Perform Upper Body Dressing: with modified independence;sitting Pt Will Perform Lower Body Dressing: with modified independence;sit to/from stand Pt Will Transfer to Toilet: with modified independence;ambulating;regular height toilet Pt Will Perform Toileting - Clothing Manipulation and hygiene: with modified independence;sit to/from stand Pt Will Perform Tub/Shower Transfer: Tub transfer;with modified independence;rolling walker;ambulating;tub bench  OT Frequency: Min 2X/week   Barriers to D/C:            Co-evaluation              End of Session Nurse Communication: Patient requests pain meds  Activity Tolerance: Patient tolerated treatment well Patient left: in bed;with call bell/phone within reach   Time: 1241-1259 OT Time Calculation (min): 18 min Charges:  OT General Charges $OT Visit: 1 Procedure OT Evaluation $Initial OT Evaluation Tier I: 1 Procedure G-Codes:    Marquite Attwood, Ursula Alert M 05/19/14, 3:01 PM

## 2014-05-18 NOTE — ED Notes (Signed)
Dr. Newton at bedside. 

## 2014-05-19 DIAGNOSIS — D509 Iron deficiency anemia, unspecified: Secondary | ICD-10-CM

## 2014-05-19 DIAGNOSIS — G08 Intracranial and intraspinal phlebitis and thrombophlebitis: Secondary | ICD-10-CM | POA: Insufficient documentation

## 2014-05-19 DIAGNOSIS — I636 Cerebral infarction due to cerebral venous thrombosis, nonpyogenic: Secondary | ICD-10-CM

## 2014-05-19 DIAGNOSIS — J4521 Mild intermittent asthma with (acute) exacerbation: Secondary | ICD-10-CM

## 2014-05-19 DIAGNOSIS — Z720 Tobacco use: Secondary | ICD-10-CM

## 2014-05-19 LAB — CBC WITH DIFFERENTIAL/PLATELET
Basophils Absolute: 0 10*3/uL (ref 0.0–0.1)
Basophils Relative: 0 % (ref 0–1)
Eosinophils Absolute: 0.1 10*3/uL (ref 0.0–0.7)
Eosinophils Relative: 3 % (ref 0–5)
HCT: 28.9 % — ABNORMAL LOW (ref 36.0–46.0)
Hemoglobin: 9.4 g/dL — ABNORMAL LOW (ref 12.0–15.0)
Lymphocytes Relative: 46 % (ref 12–46)
Lymphs Abs: 2.2 10*3/uL (ref 0.7–4.0)
MCH: 29.7 pg (ref 26.0–34.0)
MCHC: 32.5 g/dL (ref 30.0–36.0)
MCV: 91.5 fL (ref 78.0–100.0)
Monocytes Absolute: 0.2 10*3/uL (ref 0.1–1.0)
Monocytes Relative: 4 % (ref 3–12)
Neutro Abs: 2.3 10*3/uL (ref 1.7–7.7)
Neutrophils Relative %: 47 % (ref 43–77)
Platelets: 306 10*3/uL (ref 150–400)
RBC: 3.16 MIL/uL — ABNORMAL LOW (ref 3.87–5.11)
RDW: 12.7 % (ref 11.5–15.5)
WBC: 4.9 10*3/uL (ref 4.0–10.5)

## 2014-05-19 LAB — COMPREHENSIVE METABOLIC PANEL
ALT: 13 U/L (ref 0–35)
AST: 15 U/L (ref 0–37)
Albumin: 2.7 g/dL — ABNORMAL LOW (ref 3.5–5.2)
Alkaline Phosphatase: 79 U/L (ref 39–117)
Anion gap: 8 (ref 5–15)
BUN: 8 mg/dL (ref 6–23)
CO2: 23 mmol/L (ref 19–32)
Calcium: 9.1 mg/dL (ref 8.4–10.5)
Chloride: 110 mmol/L (ref 96–112)
Creatinine, Ser: 0.73 mg/dL (ref 0.50–1.10)
GFR calc Af Amer: >90 mL/min (ref >90)
GFR calc non Af Amer: >90 mL/min (ref >90)
Glucose, Bld: 85 mg/dL (ref 70–99)
Potassium: 4.4 mmol/L (ref 3.5–5.1)
Sodium: 141 mmol/L (ref 135–145)
Total Bilirubin: 0.4 mg/dL (ref 0.3–1.2)
Total Protein: 5.3 g/dL — ABNORMAL LOW (ref 6.0–8.3)

## 2014-05-19 LAB — PROTIME-INR
INR: 0.99 (ref 0.00–1.49)
Prothrombin Time: 13.2 seconds (ref 11.6–15.2)

## 2014-05-19 LAB — HOMOCYSTEINE: Homocysteine: 3.9 umol/L (ref 0.0–15.0)

## 2014-05-19 LAB — PROTEIN S, TOTAL: Protein S Ag, Total: 98 % (ref 58–150)

## 2014-05-19 LAB — LUPUS ANTICOAGULANT PANEL
DRVVT: 38.3 s (ref 0.0–55.1)
PTT Lupus Anticoagulant: 39.9 s (ref 0.0–50.0)

## 2014-05-19 LAB — PROTEIN S ACTIVITY: Protein S Activity: 67 % (ref 60–145)

## 2014-05-19 LAB — PROTEIN C ACTIVITY: Protein C Activity: 139 % (ref 74–151)

## 2014-05-19 MED ORDER — ENOXAPARIN SODIUM 120 MG/0.8ML ~~LOC~~ SOLN: 120.0000 mg | SUBCUTANEOUS | Status: DC

## 2014-05-19 MED ORDER — FERROUS SULFATE 325 (65 FE) MG PO TABS: 325.0000 mg | ORAL_TABLET | Freq: Three times a day (TID) | ORAL | Status: DC

## 2014-05-19 MED ORDER — ENOXAPARIN SODIUM 120 MG/0.8ML ~~LOC~~ SOLN
120.0000 mg | SUBCUTANEOUS | Status: DC
  Filled 2014-05-19: qty 0.8

## 2014-05-19 MED ORDER — WARFARIN SODIUM 5 MG PO TABS: ORAL_TABLET | ORAL | Status: DC

## 2014-05-19 NOTE — Progress Notes (Addendum)
STROKE TEAM PROGRESS NOTE   HISTORY Victoria Alvarez is a 24 y/o female with a past medical history that is significant for depression, substance abuse, HA, s/p C-section 05/06/14, present for further evaluation of right sided weakness-paresthesias. Patient indicated that 2 days ago she experienced sudden onset of numbness-tingling from the right hip to the right toes that lasted for approximately 5 minutes and resolved but left her with a heavy/weak sensation in her right foot that was most prominent when she was driving and pressing on the gas pedal. Then, yesterday 05/16/2014 she developed a HA that is still present and started having numbness and tingling around her right breast, right flank area, moving up to the right ear, and in the past coupe of hours she has been weak in the right leg and can not move the right foot. She also endorses some weakness of the right arm. Denies vertigo, double vision, slurred speech, language or visual impairment, bladder or bowel incontinence. No recent fever, infection, skin rah, vaccinations, trauma, or foreign travel. CT brain was reviewed and showed no acute abnormality.  Her last known well is uncertain. Patient was not administered TPA secondary to delay in arrival. She was admitted for further evaluation and treatment.   SUBJECTIVE (INTERVAL HISTORY) No family is at the bedside.  Overall she feels her condition is gradually improving. She is feeling better and wants to go home. Pharmacy recommends warfarin to be safe while breast-feeding hence we'll discharge home today with Lovenox bridge and warfarin OBJECTIVE Temp:  [98.3 F (36.8 C)-98.9 F (37.2 C)] 98.3 F (36.8 C) (03/10 0848) Pulse Rate:  [69-75] 73 (03/10 0848) Cardiac Rhythm:  [-] Normal sinus rhythm (03/09 2000) Resp:  [18-20] 20 (03/10 0848) BP: (100-134)/(52-77) 134/77 mmHg (03/10 0848) SpO2:  [97 %-100 %] 100 % (03/10 0848)  No results for input(s): GLUCAP in the last 168 hours.  Recent  Labs Lab 05/16/14 2225 05/17/14 1728 05/18/14 0023 05/18/14 0100 05/19/14 0958  NA 139 139  --  137 141  K 3.2* 3.2*  --  3.3* 4.4  CL 104 107  --  108 110  CO2 26 23  --  20 23  GLUCOSE 104* 91  --  95 85  BUN 13 13  --  10 8  CREATININE 0.67 0.68  --  0.68 0.73  CALCIUM 9.1 9.3  --  8.2* 9.1  MG  --  1.9 1.9  --   --     Recent Labs Lab 05/16/14 2225 05/17/14 1728 05/18/14 0100 05/19/14 0958  AST 16 16 13 15   ALT 16 16 13 13   ALKPHOS 105 92 75 79  BILITOT 0.7 0.5 0.5 0.4  PROT 6.0 5.7* 5.0* 5.3*  ALBUMIN 3.2* 3.0* 2.6* 2.7*    Recent Labs Lab 05/16/14 2225 05/17/14 1728 05/18/14 0100 05/18/14 0837 05/19/14 0958  WBC 7.8 10.3 7.3 7.3 4.9  NEUTROABS 4.2 6.6 3.3  --  2.3  HGB 9.7* 9.6* 8.2* 8.9* 9.4*  HCT 28.9* 28.5* 25.2* 27.5* 28.9*  MCV 92.3 91.3 92.6 94.2 91.5  PLT 302 325 263 277 306   No results for input(s): CKTOTAL, CKMB, CKMBINDEX, TROPONINI in the last 168 hours.  Recent Labs  05/17/14 2348 05/19/14 0958  LABPROT 13.9 13.2  INR 1.06 0.99    Recent Labs  05/16/14 2140  COLORURINE YELLOW  LABSPEC 1.020  PHURINE 5.5  GLUCOSEU NEGATIVE  HGBUR MODERATE*  BILIRUBINUR NEGATIVE  KETONESUR NEGATIVE  PROTEINUR NEGATIVE  UROBILINOGEN 0.2  NITRITE  NEGATIVE  LEUKOCYTESUR SMALL*    No results found for: CHOL, TRIG, HDL, CHOLHDL, VLDL, LDLCALC No results found for: HGBA1C    Component Value Date/Time   LABOPIA POSITIVE* 05/18/2014 0823   LABOPIA NEG 03/01/2014 1006   COCAINSCRNUR NONE DETECTED 05/18/2014 0823   COCAINSCRNUR NEG 03/01/2014 1006   LABBENZ NONE DETECTED 05/18/2014 0823   LABBENZ NEG 03/01/2014 1006   AMPHETMU NONE DETECTED 05/18/2014 0823   AMPHETMU NEG 03/01/2014 1006   THCU NONE DETECTED 05/18/2014 0823   THCU PPS 03/01/2014 1006   THCU 42* 03/01/2014 1006   LABBARB NONE DETECTED 05/18/2014 0823   LABBARB NEG 03/01/2014 1006     Recent Labs Lab 05/18/14 0837  ETH <5    Dg Chest 2 View  05/17/2014   CLINICAL  DATA:  Wheezing  EXAM: CHEST  2 VIEW  COMPARISON:  03/06/2014  FINDINGS: Lungs are clear.  No pleural effusion or pneumothorax.  The heart is normal in size.  Visualized osseous structures are within normal limits.  IMPRESSION: Normal chest radiographs.   Electronically Signed   By: Julian Hy M.D.   On: 05/17/2014 18:22   Ct Head Wo Contrast  05/17/2014   CLINICAL DATA:  Numbness  EXAM: CT HEAD WITHOUT CONTRAST  TECHNIQUE: Contiguous axial images were obtained from the base of the skull through the vertex without intravenous contrast.  COMPARISON:  06/01/2008  FINDINGS: No evidence of parenchymal hemorrhage or extra-axial fluid collection. No mass lesion, mass effect, or midline shift.  No CT evidence of acute infarction.  Cerebral volume is within normal limits.  No ventriculomegaly.  The visualized paranasal sinuses are essentially clear. The mastoid air cells are unopacified.  No evidence of calvarial fracture.  IMPRESSION: Normal head CT.   Electronically Signed   By: Julian Hy M.D.   On: 05/17/2014 18:11   Mr Jeri Cos PY Contrast  05/17/2014   CLINICAL DATA:  RIGHT-sided paresthesia for 5 minutes, beginning 2 days ago with residual weakness, 1 day of Headache, RIGHT arm weakness. Postpartum patient, history of infection and substance abuse.  EXAM: MRI HEAD WITH CONTRAST  MRV HEAD WITHOUT CONTRAST  TECHNIQUE: Multiplanar, multiecho pulse sequences of the brain and surrounding structures were obtained without and with intravenous contrast. Angiographic images of the head were obtained using MRV technique without contrast. MIP images provided.  CONTRAST:  29m MULTIHANCE GADOBENATE DIMEGLUMINE 529 MG/ML IV SOLN  COMPARISON:  CT of the head May 17, 2014 at 1802 hours  FINDINGS: MRI HEAD FINDINGS  Faint area of approximately 2.6 cm reduced diffusion in LEFT posterior frontal cortex at the convexity, with poor corresponding low ADC values. Mildly expansile T2 hyperintense signal resulting in mild  sulcal effacement without midline shift. Curvilinear susceptibility artifact within high LEFT frontal extra-axial space, axial 23 and 24/24 highly concerning for cortical vein thrombosis. Equivocal loss of the superior sagittal sinus flow void focally on sagittal T1 12/23. No abnormal parenchymal enhancement. Heterogeneous enhancement of the superior sagittal sinus.  Ventricles and sulci are otherwise normal for patient's age. No midline shift. No satellite areas of abnormal signal are reduced diffusion. No abnormal extra-axial fluid collections. Normal major intracranial vascular flow voids seen at the skull base.  Ocular globes and orbital contents are unremarkable. Paranasal sinuses mastoid air cells well-aerated. No abnormal sellar expansion. No cerebellar tonsillar ectopia. No suspicious calvarial bone marrow signal.  MRV HEAD FINDINGS  Irregular poor flow related enhancement of the mid superior sagittal sinus, the anterior and more proximal segments  are widely patent. RIGHT transverse sinus is dominant but both are patent with normal appearance of the sagittal sinus and included internal cerebral veins.  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 Corresponding cortical vein thrombosis.  MRA HEAD: Partially thrombosed superior sagittal sinus.   Electronically Signed   By: Elon Alas   On: 05/17/2014 23:03   Mr Venogram Head  05/17/2014   CLINICAL DATA:  RIGHT-sided paresthesia for 5 minutes, beginning 2 days ago with residual weakness, 1 day of Headache, RIGHT arm weakness. Postpartum patient, history of infection and substance abuse.  EXAM: MRI HEAD WITH CONTRAST  MRV HEAD WITHOUT CONTRAST  TECHNIQUE: Multiplanar, multiecho pulse sequences of the brain and surrounding structures were obtained without and with intravenous contrast. Angiographic images of the head were obtained using MRV technique without contrast. MIP images provided.   CONTRAST:  44m MULTIHANCE GADOBENATE DIMEGLUMINE 529 MG/ML IV SOLN  COMPARISON:  CT of the head May 17, 2014 at 1802 hours  FINDINGS: MRI HEAD FINDINGS  Faint area of approximately 2.6 cm reduced diffusion in LEFT posterior frontal cortex at the convexity, with poor corresponding low ADC values. Mildly expansile T2 hyperintense signal resulting in mild sulcal effacement without midline shift. Curvilinear susceptibility artifact within high LEFT frontal extra-axial space, axial 23 and 24/24 highly concerning for cortical vein thrombosis. Equivocal loss of the superior sagittal sinus flow void focally on sagittal T1 12/23. No abnormal parenchymal enhancement. Heterogeneous enhancement of the superior sagittal sinus.  Ventricles and sulci are otherwise normal for patient's age. No midline shift. No satellite areas of abnormal signal are reduced diffusion. No abnormal extra-axial fluid collections. Normal major intracranial vascular flow voids seen at the skull base.  Ocular globes and orbital contents are unremarkable. Paranasal sinuses mastoid air cells well-aerated. No abnormal sellar expansion. No cerebellar tonsillar ectopia. No suspicious calvarial bone marrow signal.  MRV HEAD FINDINGS  Irregular poor flow related enhancement of the mid superior sagittal sinus, the anterior and more proximal segments are widely patent. RIGHT transverse sinus is dominant but both are patent with normal appearance of the sagittal sinus and included internal cerebral veins.  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 Corresponding cortical vein thrombosis.  MRA HEAD: Partially thrombosed superior sagittal sinus.   Electronically Signed   By: CElon Alas  On: 05/17/2014 23:03     PHYSICAL EXAM Obese young Caucasian lady not in distress.. . Afebrile. Head is nontraumatic. Neck is supple without bruit.    Cardiac exam no murmur or gallop. Lungs are clear  to auscultation. Distal pulses are well felt. Neurological Exam ;  Awake  Alert oriented x 3. Normal speech and language.eye movements full without nystagmus.fundi were not visualized. Vision acuity and fields appear normal. Hearing is normal. Palatal movements are normal. Face symmetric. Tongue midline. Normal strength, tone, reflexes and coordination. Except for mild giveaway weakness of the right hip flexors Normal sensation on objective testing but subjective paresthesias in the right upper extremity.. Gait deferred. ASSESSMENT/PLAN Ms. KReha Martinovichis a 24y.o. female with history of depression, substance abuse, HA, s/p C-section 05/06/14  presenting with right sided weakness-paresthesias. She did not receive IV t-PA due to delay in arrival.   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  IV heparin for VTE prophylaxis Diet Heart  Diet - low sodium heart healthy thin liquids  no antithrombotic prior to admission, now on warfarin. As she is breast feeding, have consulted pharmacy to determine best anticoagulation choice for her,  will need full dose lovenox bridge.   Stay hydrated - encouarged to drink at minimal 8 glasses of liquids daily  Continue IVF  NO OCPs or estrogens for birth control  Therapy recommendations:  OP PT, HH OT  Disposition:  Home. Recommend OP therapies  Hypertension  Stable  Other Stroke Risk Factors  Cigarette smoker, advised to stop smoking  THC use  Hx drug use, currently on methadone  Family history of stroke (maternal grandmother)  Obesity, Body mass index is 30.48 kg/(m^2).   Hospital day #  [place] for Pager information 05/19/2014 1:08 PM  I have personally examined this patient, reviewed notes, independently viewed imaging studies, participated in medical decision making and plan of care. I have made any additions or clarifications directly to the above note. Agree with note above. She has had 2 weeks of postpartum headache with 3 days of right hand paresthesias likely from a small subacute left frontoparietal parasagittal venous infarct from cortical vein thrombosis. MR venogram also shows a focal clot in the superior sagittal sinus which is nonocclusive hence we will need to treat her with anticoagulation for at least 6 months.   Will likely discharge today after initiating warfarin with 3 days of overlapping bridging with full dose Lovenox.F/U as outpatient in 2 months and will repeat MRV after that visit  Antony Contras, MD Medical Director [place] Pager: [phone] 05/19/2014 1:08 PM    To contact Stroke Continuity provider, please refer to [email or web address] After hours, contact General Neurology

## 2014-05-19 NOTE — Progress Notes (Signed)
Physical Therapy Treatment Patient Details Name: Victoria Alvarez MRN: [id number] DOB: 1990-07-09 Today's Date: 05/19/2014    History of Present Illness Patient is a 24 y/o female admitted due to Right sided weakness/numbness. 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 Patient with recent C-section 2/26 (baby in NICU) most likely experiencing hypercoagulable state from pregnancy; although she has a hx of risk factors including recreational drugs and smoking. Goes to methadone clinic.    PT Comments    Patient progressing slowly with mobility. New numbness reported today worsened from yesterday. Continues to have decreased foot clearance on right. Education provided on compensatory techniques to assist with gait/clearing RLE. Discussed safety when holding newborn - sitting down and not walking while he is in her arms as pt fall risk. Pt agreeable. D/c recommendation updated to HHPT due to insurance conflicts. Will continue to follow per current POC.   Follow Up Recommendations  Home health PT;Supervision for mobility/OOB     Equipment Recommendations  None recommended by PT    Recommendations for Other Services       Precautions / Restrictions Precautions Precautions: Fall Restrictions Weight Bearing Restrictions: No    Mobility  Bed Mobility Overal bed mobility: Modified Independent Bed Mobility: Supine to Sit     Supine to sit: Modified independent (Device/Increase time)     General bed mobility comments: Use of rails.   Transfers Overall transfer level: Needs assistance Equipment used: None Transfers: Sit to/from Stand Sit to Stand: Supervision         General transfer comment: Supervision for safety.  Ambulation/Gait Ambulation/Gait assistance: Min guard Ambulation Distance (Feet): 200 Feet Assistive device: None Gait Pattern/deviations: Step-through pattern;Decreased stride length;Decreased dorsiflexion - right   Gait velocity  interpretation: Below normal speed for age/gender General Gait Details: Pt with decreased foot clearance RLE- dragging of toes noted when fatigued. Reports increased numbness today in RLE. Cues to use vision to help with Right foot placement.     Stairs Stairs: Yes Stairs assistance: Supervision Stair Management: Two rails;Step to pattern Number of Stairs: 3 (+ 2 steps x2 bouts) General stair comments: Cues for safety and technique.  Wheelchair Mobility    Modified Rankin (Stroke Patients Only) Modified Rankin (Stroke Patients Only) Pre-Morbid Rankin Score: No symptoms Modified Rankin: Moderately severe disability     Balance Overall balance assessment: Needs assistance Sitting-balance support: Feet supported;No upper extremity supported Sitting balance-Leahy Scale: Good     Standing balance support: During functional activity Standing balance-Leahy Scale: Fair                      Cognition Arousal/Alertness: Awake/alert Behavior During Therapy: Flat affect Overall Cognitive Status: Within Functional Limits for tasks assessed                      Exercises      General Comments General comments (skin integrity, edema, etc.): Discussed safety concerns with driving esp with numbness and decreased muscle activity in right foot. Advised not to drive for safety concerns.      Pertinent Vitals/Pain Pain Assessment: No/denies pain    Home Living                      Prior Function            PT Goals (current goals can now be found in the care plan section) Progress towards PT goals: Progressing toward goals    Frequency  Min 4X/week  PT Plan Current plan remains appropriate;Discharge plan needs to be updated    Co-evaluation             End of Session Equipment Utilized During Treatment: Gait belt Activity Tolerance: Patient tolerated treatment well Patient left: in bed;with call bell/phone within reach     Time:  1046-1103 PT Time Calculation (min) (ACUTE ONLY): 17 min  Charges:  $Gait Training: 8-22 mins                    G CodesAlvie Heidelberg A 2014/05/31, 11:11 AM  Alvie Heidelberg, PT, DPT [phone]

## 2014-05-19 NOTE — Discharge Summary (Signed)
Physician Discharge Summary  Victoria Alvarez QQP:[id number] DOB: 1990-05-16 DOA: 05/17/2014  PCP: Dustin Flock, PA-C  Admit date: 05/17/2014 Discharge date: 05/19/2014  Time spent: >30 minutes  Recommendations for Outpatient Follow-up:  1. Reassess BP and start antihypertensive regimen if needed 2. Follow up in 2 months with neurology service 3. Patient needs close follow up to her coumadin level and further adjustment to dose as needed. Treatment to be provided for 6 months 4. Aspirin to be initiated after coumadin for secondary prevention 5. Follow final results of hypercoagulable panel 6. 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 Discharge home with [facility] and treatment in 5 days. [facility] services to check on coumadin level and to provide HHPT/HHOT arranged.  Diet recommendation: heart healthy and low calorie diet  Filed Weights   05/17/14 1705  Weight: 78.019 kg (172 lb)    History of present illness:   24 y/o female with a past medical history that is significant for depression, substance abuse, HA, s/p C-section 05/06/14, present for further evaluation of right sided weakness-paresthesias. Patient indicated that 2 days ago she experienced sudden onset of numbness-tingling from the right hip to the right toes that lasted for approximately 5 minutes and resolved but left her with a heavy/weak sensation in her right foot that was most prominent when she was driving and pressing on the gas pedal. Then, yesterday 05/16/2014 she developed a HA that is still present and started having numbness and tingling around her right breast, right flank area, moving up to the right ear, and in the past  coupe of hours she has been weak in the right leg and can not move the right foot. She also endorses some weakness of the right arm. Denies vertigo, double vision, slurred speech, language or visual impairment, bladder or bowel incontinence.   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 -patient will be discharge on bridging therapy of lovenox and coumadin -will need therapy for 6 month and subsequently will use ASA for secondary prevention -no oral contraceptive pills or estrogens for birth control -outpatient PT and HHOT -safe for breast feeding with use of lovenox and coumadin -advise to quit smoking and not to use any recreational drug  2-migraine: continue use of Fioricet  3-hx of cocaine use: continue use of methadone and outpatient detox follow up  4-overweight post pregnancy (C-section on 2/26) Body mass index is 30.48 kg/(m^2). -low calorie diet and exercise recommended  5- tobacco abuse: cessation counseling provided  6-hx of asthma: stable and well controlled -continue use of albuterol PRN -encourage to stop smoking  7-elevated BP: around pregnancy  -most likely pre-eclampsia  -BP is now stable -recommending low sodium diet -no meds initiated -will need follow up and initiation of antihypertensive agents if needed   8-anemia: iron deficiency with recent c-section and from pregnancy -continue iron supplementation three times a day -no signs of acute bleeding appreciated  Procedures:  See below for x-ray reports  Consultations:  Neurology   Discharge Exam: Filed Vitals:   05/19/14 0848  BP: 134/77  Pulse: 73  Temp: 98.3 F (36.8 C)  Resp: 20  General: feeling better, just residual deficit on her RLE, no fever, no CP, no nausea or vomiting and no abnormalities  Cardiovascular: S1 and  S2, no rubs or gallops Respiratory: CTA bilaterally Abd: soft, NT, ND, positive BS Extremities: trace edema, no redness, no cyanosis or clubbing Neuro: right foot with weak dorsiflexion and per patient numbness sensation and paresthesia on her right upper extremity; no other focal deficit appreciated on exam  Discharge Instructions  Discharge Instructions    Diet - low sodium heart healthy    Complete by:  As directed      Discharge instructions    Complete by:  As directed   Take medications as prescribed [facility] as instructed for further adjustments on coumadin level Stop smoking Maintain good hydration          Current Discharge Medication List    START taking these medications   Details  enoxaparin (LOVENOX) 120 MG/0.8ML injection Inject 0.8 mLs (120 mg total) into the skin daily. Qty: 5 Syringe, Refills: 0    warfarin (COUMADIN) 5 MG tablet Take 7.5 mg daily X 1 day on 05/19/14, after that take 67m daily until follow up with [facility] for further adjustment Qty: 30 tablet, Refills: 0      CONTINUE these medications which have CHANGED   Details  ferrous sulfate 325 (65 FE) MG tablet Take 1 tablet (325 mg total) by mouth 3 (three) times daily with meals. Qty: 90 tablet, Refills: 3      CONTINUE these medications which have NOT CHANGED   Details  albuterol (PROVENTIL HFA;VENTOLIN HFA) 108 (90 BASE) MCG/ACT inhaler Inhale 2 puffs into the lungs every 6 (six) hours as needed for wheezing or shortness of breath. Qty: 1 Inhaler, Refills: 2    methadone (DOLOPHINE) 10 MG/ML solution Take 64 mg by mouth daily.     ondansetron (ZOFRAN ODT) 8 MG disintegrating tablet Take 1 tablet (8 mg total) by mouth every 8 (eight) hours as needed for nausea or vomiting. Qty: 20 tablet, Refills: 0   Associated Diagnoses: Nausea and vomiting during pregnancy    oxybutynin (DITROPAN) 5 MG tablet Take 1 tablet (5 mg total) by mouth 2 (two) times daily. Qty: 60  tablet, Refills: 3    butalbital-acetaminophen-caffeine (FIORICET) 50-325-40 MG per tablet Take 1 tablet by mouth every 6 (six) hours as needed for headache. Qty: 20 tablet, Refills: 0      STOP taking these medications     ibuprofen (ADVIL,MOTRIN) 600 MG tablet      ondansetron (ZOFRAN) 4 MG tablet        Allergies  Allergen Reactions  . Ciprofloxacin Diarrhea and Nausea And Vomiting   Follow-up Information    Follow up with [facility]   . Go on 05/20/2014.   Why:  be there at 1:30   Contact information:   [address] [place] [phone]     The results of significant diagnostics from this hospitalization (including imaging, microbiology, ancillary and laboratory) are listed below for reference.    Significant Diagnostic Studies: Dg Chest 2 View  05/17/2014   CLINICAL DATA:  Wheezing  EXAM: CHEST  2 VIEW  COMPARISON:  03/06/2014  FINDINGS: Lungs are clear.  No pleural effusion or pneumothorax.  The heart is normal in size.  Visualized osseous structures are within normal limits.  IMPRESSION: Normal chest radiographs.   Electronically Signed   By: SHenderson NewcomerD.  On: 05/17/2014 18:22   Ct Head Wo Contrast  05/17/2014   CLINICAL DATA:  Numbness  EXAM: CT HEAD WITHOUT CONTRAST  TECHNIQUE: Contiguous axial images were obtained from the base of the skull through the vertex without intravenous contrast.  COMPARISON:  06/01/2008  FINDINGS: No evidence of parenchymal hemorrhage or extra-axial fluid collection. No mass lesion, mass effect, or midline shift.  No CT evidence of acute infarction.  Cerebral volume is within normal limits.  No ventriculomegaly.  The visualized paranasal sinuses are essentially clear. The mastoid air cells are unopacified.  No evidence of calvarial fracture.  IMPRESSION: Normal head CT.   Electronically Signed   By: Julian Hy M.D.   On: 05/17/2014 18:11   Mr Jeri Cos DU  Contrast  05/17/2014   CLINICAL DATA:  RIGHT-sided paresthesia for 5 minutes, beginning 2 days ago with residual weakness, 1 day of Headache, RIGHT arm weakness. Postpartum patient, history of infection and substance abuse.  EXAM: MRI HEAD WITH CONTRAST  MRV HEAD WITHOUT CONTRAST  TECHNIQUE: Multiplanar, multiecho pulse sequences of the brain and surrounding structures were obtained without and with intravenous contrast. Angiographic images of the head were obtained using MRV technique without contrast. MIP images provided.  CONTRAST:  23m MULTIHANCE GADOBENATE DIMEGLUMINE 529 MG/ML IV SOLN  COMPARISON:  CT of the head May 17, 2014 at 1802 hours  FINDINGS: MRI HEAD FINDINGS  Faint area of approximately 2.6 cm reduced diffusion in LEFT posterior frontal cortex at the convexity, with poor corresponding low ADC values. Mildly expansile T2 hyperintense signal resulting in mild sulcal effacement without midline shift. Curvilinear susceptibility artifact within high LEFT frontal extra-axial space, axial 23 and 24/24 highly concerning for cortical vein thrombosis. Equivocal loss of the superior sagittal sinus flow void focally on sagittal T1 12/23. No abnormal parenchymal enhancement. Heterogeneous enhancement of the superior sagittal sinus.  Ventricles and sulci are otherwise normal for patient's age. No midline shift. No satellite areas of abnormal signal are reduced diffusion. No abnormal extra-axial fluid collections. Normal major intracranial vascular flow voids seen at the skull base.  Ocular globes and orbital contents are unremarkable. Paranasal sinuses mastoid air cells well-aerated. No abnormal sellar expansion. No cerebellar tonsillar ectopia. No suspicious calvarial bone marrow signal.  MRV HEAD FINDINGS  Irregular poor flow related enhancement of the mid superior sagittal sinus, the anterior and more proximal segments are widely patent. RIGHT transverse sinus is dominant but both are patent with normal  appearance of the sagittal sinus and included internal cerebral veins.  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 Corresponding cortical vein thrombosis.  MRA HEAD: Partially thrombosed superior sagittal sinus.   Electronically Signed   By: CElon Alas  On: 05/17/2014 23:03   Mr Venogram Head  05/17/2014   CLINICAL DATA:  RIGHT-sided paresthesia for 5 minutes, beginning 2 days ago with residual weakness, 1 day of Headache, RIGHT arm weakness. Postpartum patient, history of infection and substance abuse.  EXAM: MRI HEAD WITH CONTRAST  MRV HEAD WITHOUT CONTRAST  TECHNIQUE: Multiplanar, multiecho pulse sequences of the brain and surrounding structures were obtained without and with intravenous contrast. Angiographic images of the head were obtained using MRV technique without contrast. MIP images provided.  CONTRAST:  162mMULTIHANCE GADOBENATE DIMEGLUMINE 529 MG/ML IV SOLN  COMPARISON:  CT of the head May 17, 2014 at 1802 hours  FINDINGS: MRI HEAD FINDINGS  Faint area of approximately 2.6 cm reduced diffusion in LEFT posterior frontal  cortex at the convexity, with poor corresponding low ADC values. Mildly expansile T2 hyperintense signal resulting in mild sulcal effacement without midline shift. Curvilinear susceptibility artifact within high LEFT frontal extra-axial space, axial 23 and 24/24 highly concerning for cortical vein thrombosis. Equivocal loss of the superior sagittal sinus flow void focally on sagittal T1 12/23. No abnormal parenchymal enhancement. Heterogeneous enhancement of the superior sagittal sinus.  Ventricles and sulci are otherwise normal for patient's age. No midline shift. No satellite areas of abnormal signal are reduced diffusion. No abnormal extra-axial fluid collections. Normal major intracranial vascular flow voids seen at the skull base.  Ocular globes and orbital contents are unremarkable. Paranasal sinuses  mastoid air cells well-aerated. No abnormal sellar expansion. No cerebellar tonsillar ectopia. No suspicious calvarial bone marrow signal.  MRV HEAD FINDINGS  Irregular poor flow related enhancement of the mid superior sagittal sinus, the anterior and more proximal segments are widely patent. RIGHT transverse sinus is dominant but both are patent with normal appearance of the sagittal sinus and included internal cerebral veins.  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 Corresponding cortical vein thrombosis.  MRA HEAD: Partially thrombosed superior sagittal sinus.   Electronically Signed   By: Elon Alas   On: 05/17/2014 23:03   US Ob Follow Up  05/05/2014   OBSTETRICAL ULTRASOUND: This exam was performed within a [facility]. The OB US report was generated in the AS system, and faxed to the ordering physician.   This report is available in the [facility]. See the [facility] Obstetric US report via the Image Link.  US Fetal Bpp W/o Non Stress  05/02/2014   OBSTETRICAL ULTRASOUND: This exam was performed within a [facility]. The OB US report was generated in the [facility] system, and faxed to the ordering physician.   This report is available in the [facility]. See the [facility] Obstetric US report via the Image Link.   Labs: Basic Metabolic Panel:  Recent Labs Lab 05/16/14 2225 05/17/14 1728 05/18/14 0023 05/18/14 0100  NA 139 139  --  137  K 3.2* 3.2*  --  3.3*  CL 104 107  --  108  CO2 26 23  --  20  GLUCOSE 104* 91  --  95  BUN 13 13  --  10  CREATININE 0.67 0.68  --  0.68  CALCIUM 9.1 9.3  --  8.2*  MG  --  1.9 1.9  --    Liver Function Tests:  Recent Labs Lab 05/16/14 2225 05/17/14 1728 05/18/14 0100  AST 16 16 13   ALT 16 16 13   ALKPHOS 105 92 75  BILITOT 0.7 0.5 0.5  PROT 6.0 5.7* 5.0*  ALBUMIN 3.2* 3.0* 2.6*   CBC:  Recent Labs Lab 05/16/14 2225 05/17/14 1728  05/18/14 0100 05/18/14 0837 05/19/14 0958  WBC 7.8 10.3 7.3 7.3 4.9  NEUTROABS 4.2 6.6 3.3  --  2.3  HGB 9.7* 9.6* 8.2* 8.9* 9.4*  HCT 28.9* 28.5* 25.2* 27.5* 28.9*  MCV 92.3 91.3 92.6 94.2 91.5  PLT 302 325 263 277 306    Signed:  Barton Dubois  [facility] 05/19/2014, 10:49 AM

## 2014-05-19 NOTE — Discharge Instructions (Addendum)

## 2014-05-19 NOTE — Progress Notes (Signed)
ANTICOAGULATION CONSULT NOTE   Pharmacy Consult for Heparin --> Coumadin / Lovenox Indication:  sinus thrombosis  Allergies  Allergen Reactions  . Ciprofloxacin Diarrhea and Nausea And Vomiting    Labs:  Recent Labs  05/16/14 2225 05/17/14 1728 05/17/14 2348 05/18/14 0100 05/18/14 0837  HGB 9.7* 9.6*  --  8.2* 8.9*  HCT 28.9* 28.5*  --  25.2* 27.5*  PLT 302 325  --  263 277  APTT  --   --  28  --   --   LABPROT  --   --  13.9  --   --   INR  --   --  1.06  --   --   HEPARINUNFRC  --   --   --   --  0.32  CREATININE 0.67 0.68  --  0.68  --     Estimated Creatinine Clearance: 107.2 mL/min (by C-G formula based on Cr of 0.68).  Assessment: 24 yo female with acute frontal cortical venous infarction, s/p C-section 2/26, previously on heparin now to be switched to Lovenox and Coumadin  Planning home today  Goal of Therapy:  INR = 2 to 3 Appropriate Lovenox dosing Monitor platelets by anticoagulation protocol: Yes   Plan:  Change Lovenox to 120 mg sq Q 24 hours at noon Home with Coumadin 5 mg po daily Follow up INR Saturday if possible, Monday otherwise  Thank you. Okey Regal, PharmD [phone] 05/19/2014,9:38 AM

## 2014-05-19 NOTE — Progress Notes (Signed)
Pt discharged at  alert, verbal taking all personal belongings home. IV discontinued, applied dry dressing. Discharge instructions and prescriptions provided with verbal understanding. Pt aware of follow up appts scheduled. No noted distress. Denied pain or discomfort.

## 2014-05-20 ENCOUNTER — Ambulatory Visit: Payer: Self-pay

## 2014-05-20 ENCOUNTER — Telehealth: Payer: Self-pay | Admitting: Emergency Medicine

## 2014-05-20 ENCOUNTER — Ambulatory Visit: Payer: Medicaid Other | Attending: Family Medicine | Admitting: Family Medicine

## 2014-05-20 VITALS — BP 116/78 | HR 75 | Temp 98.0°F | Resp 16 | Ht 63.0 in | Wt 176.0 lb

## 2014-05-20 DIAGNOSIS — F1120 Opioid dependence, uncomplicated: Secondary | ICD-10-CM | POA: Diagnosis not present

## 2014-05-20 DIAGNOSIS — R51 Headache: Secondary | ICD-10-CM | POA: Insufficient documentation

## 2014-05-20 DIAGNOSIS — Z7901 Long term (current) use of anticoagulants: Secondary | ICD-10-CM | POA: Diagnosis not present

## 2014-05-20 DIAGNOSIS — Z5181 Encounter for therapeutic drug level monitoring: Secondary | ICD-10-CM

## 2014-05-20 DIAGNOSIS — J45901 Unspecified asthma with (acute) exacerbation: Secondary | ICD-10-CM | POA: Insufficient documentation

## 2014-05-20 DIAGNOSIS — R269 Unspecified abnormalities of gait and mobility: Secondary | ICD-10-CM | POA: Insufficient documentation

## 2014-05-20 DIAGNOSIS — I69398 Other sequelae of cerebral infarction: Secondary | ICD-10-CM | POA: Diagnosis not present

## 2014-05-20 DIAGNOSIS — I69351 Hemiplegia and hemiparesis following cerebral infarction affecting right dominant side: Secondary | ICD-10-CM | POA: Diagnosis not present

## 2014-05-20 DIAGNOSIS — Z87891 Personal history of nicotine dependence: Secondary | ICD-10-CM | POA: Insufficient documentation

## 2014-05-20 DIAGNOSIS — Z720 Tobacco use: Secondary | ICD-10-CM | POA: Insufficient documentation

## 2014-05-20 DIAGNOSIS — D509 Iron deficiency anemia, unspecified: Secondary | ICD-10-CM | POA: Insufficient documentation

## 2014-05-20 LAB — CARDIOLIPIN ANTIBODIES, IGG, IGM, IGA
Anticardiolipin IgA: <9 APL U/mL (ref 0–11)
Anticardiolipin IgG: <9 GPL U/mL (ref 0–14)
Anticardiolipin IgM: 14 MPL U/mL — ABNORMAL HIGH (ref 0–12)

## 2014-05-20 LAB — BETA-2-GLYCOPROTEIN I ABS, IGG/M/A
Beta-2 Glyco I IgG: <9 GPI IgG units (ref 0–20)
Beta-2-Glycoprotein I IgA: <9 GPI IgA units (ref 0–25)
Beta-2-Glycoprotein I IgM: <9 GPI IgM units (ref 0–32)

## 2014-05-20 LAB — PROTEIN C, TOTAL: Protein C, Total: 94 % (ref 70–140)

## 2014-05-20 LAB — POCT INR: INR: 1.8

## 2014-05-20 MED ORDER — WARFARIN SODIUM 5 MG PO TABS: ORAL_TABLET | ORAL | Status: DC

## 2014-05-20 NOTE — Progress Notes (Signed)
History of Present Illness:This is a 24 y.o. year old female with significant past medical history of polysubstance on chronic methadone, depression/anxiety, recent LTCS w/ child in NICU currently presenting with hemiparesis. Pt states that she has had intermittent R sided weakness over past 2-3 days. States that she woke up this am w/ complete inability to use R leg. Denies any prior hx/o stroke/CVA in the past.  Presents to the ER afebrile, hemodynamically stable. Satting well on RA. WBC 10.3, hgb 9.6, K 3.2, Cr 0.68. CXR, Head CT WNL. 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 Neurology consulted recommending anticoagulation and hypercoaguable workup.   Assessment and Plan: Victoria Alvarez is a 24 y.o. year old female presenting with hemiparesis   Active Problems:  Hemiparesis   1- hemiparesis  -noted findings on imaging including cortical venous infarction, cortical vein thrombosis, partially thrombosed superior sagittal vein.  -heparin gtt per neuro recs -hypercoaguable pain  -MRI C, T, and L spine w/ and w/o contrast -f/u neuro recs -discussed case w/ OB-GYN attending (heparin and coumadin safe for breast feeding)  2-Methadone  -cont  -will need coordination w/ family and social work given breast feeding status and child currently in NICU

## 2014-05-20 NOTE — Progress Notes (Signed)
Patient ID: Victoria Alvarez, female   DOB: [birth year], 24 y.o.   MRN: [id number]   Subjective:   Above patient presents today for follow-up hospital visit for severe HA and cerebral thrombosis. She was seen on the 7th of March for the HA and tingling and numbness of right leg and was diagnosed with a migraine headache. She returned the next day with worsening symptoms and was diagnosed with ceberal thrombosis and stroke. She was anti-coagulated and discharged on Coumadin.  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 She had a very recent C-section and her child is still in NICU.  She presents today specifically for an INR and to establish care.A review of her hospital course shows that she has a history of drug abuse and tobacco abuse. She also experienced pre-eclampsia and pylonephritis during pregnancy. She has been on methadone maintenance. She has a history of iron deficiency anemia.Her major complaint today is a HA that 7/10.  However when I come back into the room, she states it is gone.  ROS:  See HPI  OBjective:  General: She is alert, oriented, appropriate in no distress. Neuro:  She is alert, oriented and appropriate, Cranial Nerves II-XII intact. PERL, grips strong and equal, no arm drift. She does have some gait deficieny. Her right leg shows weakness compared to the right. HEENT: normocephalic, atraumatic. Neck:  Supple FROM, w/o adenopathy or tenderness.  Lungs are clear to auscultation. Heart:  HS regular w/o m,g,r/  Her INR today is 1.8   Assessment: 1. Cerebral Thrombosis treated with Coumadin.    2.Inadequate anticoagulation. Plan  1.: Due to the return of the Headache and after consultation with Dr. Kandee Keen, we are arranging a CT w/o contrast for tomorrow  2. Increase coumadin to 1 1/2 daily and return in 5-7 days for a recheck of INR.

## 2014-05-20 NOTE — Telephone Encounter (Signed)
Pt given scheduled CT scan appointment @ [facility] 05/30/14 @ 9am. Pt told she need to arrive at 845 am Pt verbalized understanding

## 2014-05-20 NOTE — Patient Instructions (Addendum)
We are scheduling an MRI for tomorrow. If your symptoms worsen, please go to ED.  Take 1 1/2 of coumadin daily and return in one week for recheck INR.

## 2014-05-20 NOTE — Lactation Note (Signed)
This note was copied from the chart of Victoria Alvarez. Lactation Consultation Note  I saw mom briefly this AM in the NICU.  She states she was discharged from the hospital yesterday.  She states she had a stroke and is now on blood thinners.  Mom states she continued to pump while in hospital.  Stressed importance of taking care of herself now.  Patient Name: Victoria Ammara Raj [id number] Date: 05/20/2014     Maternal Data    Feeding Feeding Type: Breast Milk with Formula added Nipple Type: Regular Length of feed: 20 min  LATCH Score/Interventions                      Lactation Tools Discussed/Used     Consult Status      [name] 05/20/2014, 6:23 PM

## 2014-05-23 LAB — PROTHROMBIN GENE MUTATION

## 2014-05-23 LAB — FACTOR 5 LEIDEN

## 2014-05-27 ENCOUNTER — Telehealth: Payer: Self-pay | Admitting: Emergency Medicine

## 2014-05-27 ENCOUNTER — Telehealth: Payer: Self-pay | Admitting: Physician Assistant

## 2014-05-27 NOTE — Telephone Encounter (Signed)
[facility] called requesting prior authorization for CT scan, appt 05/30/14 please f/u at [phone]

## 2014-05-27 NOTE — Telephone Encounter (Signed)
[facility] nurse Kathie Rhodes called in to report PT/INR 2.2/26.1, pt is taking 5mg  Coumadin daily

## 2014-05-27 NOTE — Telephone Encounter (Signed)
Betty nurse from [facility] called regarding pt's coumadin results INR:2.2 and P.T:26.1, pt is taking 65m daily. If any additional information is needed please contact nurse at [phone]

## 2014-05-30 ENCOUNTER — Ambulatory Visit (HOSPITAL_COMMUNITY): Payer: Medicaid Other | Attending: Family Medicine

## 2014-05-30 ENCOUNTER — Ambulatory Visit: Payer: Self-pay

## 2014-05-30 NOTE — Lactation Note (Signed)
This note was copied from the chart of [name]. Lactation Consultation Note  Met with mom at infants bedside to discuss her concerns about low milk supply.  This mom has had complications with her health since delivery.  She was readmitted to [facility] hospital for a blood clot in her brain.  She is currently receiving follow up care including physical therapy.  Mom states a few days ago she was obtaining less than 1 ml from each breast.  In the past two days she has been able to increase her pumping to every 2-3 hours and she is now obtaining 15 mls per breast.  Assisted mom with placing baby on her chest skin to skin and baby started to cue and root.  Baby was unable to latch to breast so a #20 mm nipple shield used.  Baby latched but became frantic due to slow milk flow.  Nipple shield filled with breast milk using a curved tip syringe.  Baby relaxed and suckled actively.  A lot of reassurance given along with tips on pumping.  LC will continue to follow.  Patient Name: Victoria Alvarez [id number] Date: 05/30/2014 Reason for consult: Follow-up assessment   Maternal Data    Feeding Feeding Type: Breast Fed Length of feed: 15 min  LATCH Score/Interventions Latch: Grasps breast easily, tongue down, lips flanged, rhythmical sucking. Intervention(s): Skin to skin;Teach feeding cues;Waking techniques Intervention(s): Breast compression;Breast massage;Assist with latch;Adjust position  Audible Swallowing: A few with stimulation Intervention(s): Alternate breast massage  Type of Nipple: Everted at rest and after stimulation  Comfort (Breast/Nipple): Soft / non-tender     Hold (Positioning): Assistance needed to correctly position infant at breast and maintain latch. Intervention(s): Breastfeeding basics reviewed;Support Pillows;Skin to skin  LATCH Score: 8  Lactation Tools Discussed/Used Nipple shield size: 20   Consult Status Consult Status: Follow-up Date: 05/31/14 Follow-up  type: In-patient    Ave Filter 05/30/2014, 12:11 PM

## 2014-06-02 ENCOUNTER — Other Ambulatory Visit: Payer: Self-pay | Admitting: Obstetrics & Gynecology

## 2014-06-02 ENCOUNTER — Telehealth: Payer: Self-pay | Admitting: General Practice

## 2014-06-02 ENCOUNTER — Encounter: Payer: Self-pay | Admitting: Family Medicine

## 2014-06-02 NOTE — Telephone Encounter (Signed)
Patient sent mychart message that incision had opened up and started to bleed. Called patient, no answer- left message stating we are trying to reach you in regards to your mychart message. We are now closed for the holiday weekend and won't be open until Monday. Please go to the ER for further evaluation.

## 2014-06-07 ENCOUNTER — Telehealth: Payer: Self-pay | Admitting: Emergency Medicine

## 2014-06-07 NOTE — Telephone Encounter (Signed)
Sign and fax.

## 2014-06-08 ENCOUNTER — Ambulatory Visit (HOSPITAL_COMMUNITY): Payer: Medicaid Other

## 2014-06-11 ENCOUNTER — Encounter (HOSPITAL_COMMUNITY): Payer: Self-pay | Admitting: Adult Health

## 2014-06-11 ENCOUNTER — Emergency Department (HOSPITAL_COMMUNITY): Payer: Medicaid Other

## 2014-06-11 ENCOUNTER — Inpatient Hospital Stay (HOSPITAL_COMMUNITY)
Admission: EM | Admit: 2014-06-11 | Discharge: 2014-06-13 | DRG: 092 | Disposition: A | Discharge status: Home / Self Care | Payer: Medicaid Other | Attending: Internal Medicine | Admitting: Internal Medicine

## 2014-06-11 DIAGNOSIS — I676 Nonpyogenic thrombosis of intracranial venous system: Secondary | ICD-10-CM | POA: Diagnosis not present

## 2014-06-11 DIAGNOSIS — R29898 Other symptoms and signs involving the musculoskeletal system: Secondary | ICD-10-CM | POA: Diagnosis not present

## 2014-06-11 DIAGNOSIS — I639 Cerebral infarction, unspecified: Secondary | ICD-10-CM

## 2014-06-11 DIAGNOSIS — Z881 Allergy status to other antibiotic agents status: Secondary | ICD-10-CM

## 2014-06-11 DIAGNOSIS — G8190 Hemiplegia, unspecified affecting unspecified side: Secondary | ICD-10-CM | POA: Diagnosis present

## 2014-06-11 DIAGNOSIS — I82890 Acute embolism and thrombosis of other specified veins: Secondary | ICD-10-CM | POA: Diagnosis present

## 2014-06-11 DIAGNOSIS — G08 Intracranial and intraspinal phlebitis and thrombophlebitis: Principal | ICD-10-CM | POA: Diagnosis present

## 2014-06-11 DIAGNOSIS — I6389 Other cerebral infarction: Secondary | ICD-10-CM | POA: Insufficient documentation

## 2014-06-11 DIAGNOSIS — D509 Iron deficiency anemia, unspecified: Secondary | ICD-10-CM | POA: Diagnosis present

## 2014-06-11 DIAGNOSIS — I959 Hypotension, unspecified: Secondary | ICD-10-CM | POA: Diagnosis present

## 2014-06-11 DIAGNOSIS — Z7901 Long term (current) use of anticoagulants: Secondary | ICD-10-CM | POA: Diagnosis not present

## 2014-06-11 DIAGNOSIS — R569 Unspecified convulsions: Secondary | ICD-10-CM

## 2014-06-11 DIAGNOSIS — R05 Cough: Secondary | ICD-10-CM

## 2014-06-11 DIAGNOSIS — Z794 Long term (current) use of insulin: Secondary | ICD-10-CM

## 2014-06-11 DIAGNOSIS — G894 Chronic pain syndrome: Secondary | ICD-10-CM | POA: Diagnosis present

## 2014-06-11 DIAGNOSIS — F1110 Opioid abuse, uncomplicated: Secondary | ICD-10-CM | POA: Diagnosis present

## 2014-06-11 DIAGNOSIS — I495 Sick sinus syndrome: Secondary | ICD-10-CM | POA: Diagnosis present

## 2014-06-11 DIAGNOSIS — F1910 Other psychoactive substance abuse, uncomplicated: Secondary | ICD-10-CM | POA: Insufficient documentation

## 2014-06-11 DIAGNOSIS — I638 Other cerebral infarction: Secondary | ICD-10-CM | POA: Diagnosis not present

## 2014-06-11 DIAGNOSIS — Z8673 Personal history of transient ischemic attack (TIA), and cerebral infarction without residual deficits: Secondary | ICD-10-CM

## 2014-06-11 DIAGNOSIS — F329 Major depressive disorder, single episode, unspecified: Secondary | ICD-10-CM | POA: Diagnosis present

## 2014-06-11 DIAGNOSIS — R059 Cough, unspecified: Secondary | ICD-10-CM

## 2014-06-11 DIAGNOSIS — F17210 Nicotine dependence, cigarettes, uncomplicated: Secondary | ICD-10-CM | POA: Diagnosis present

## 2014-06-11 LAB — CBC
HCT: 35.1 % — ABNORMAL LOW (ref 36.0–46.0)
Hemoglobin: 11 g/dL — ABNORMAL LOW (ref 12.0–15.0)
MCH: 25.9 pg — ABNORMAL LOW (ref 26.0–34.0)
MCHC: 31.3 g/dL (ref 30.0–36.0)
MCV: 82.6 fL (ref 78.0–100.0)
Platelets: 258 10*3/uL (ref 150–400)
RBC: 4.25 MIL/uL (ref 3.87–5.11)
RDW: 16.8 % — ABNORMAL HIGH (ref 11.5–15.5)
WBC: 6.5 10*3/uL (ref 4.0–10.5)

## 2014-06-11 LAB — DIFFERENTIAL
Basophils Absolute: 0 10*3/uL (ref 0.0–0.1)
Basophils Relative: 0 % (ref 0–1)
Eosinophils Absolute: 0.1 10*3/uL (ref 0.0–0.7)
Eosinophils Relative: 2 % (ref 0–5)
Lymphocytes Relative: 43 % (ref 12–46)
Lymphs Abs: 2.7 10*3/uL (ref 0.7–4.0)
Monocytes Absolute: 0.3 10*3/uL (ref 0.1–1.0)
Monocytes Relative: 5 % (ref 3–12)
Neutro Abs: 3.2 10*3/uL (ref 1.7–7.7)
Neutrophils Relative %: 50 % (ref 43–77)

## 2014-06-11 LAB — I-STAT CHEM 8, ED
BUN: 8 mg/dL (ref 6–23)
Calcium, Ion: 1.17 mmol/L (ref 1.12–1.23)
Chloride: 104 mmol/L (ref 96–112)
Creatinine, Ser: 0.9 mg/dL (ref 0.50–1.10)
Glucose, Bld: 84 mg/dL (ref 70–99)
HCT: 36 % (ref 36.0–46.0)
Hemoglobin: 12.2 g/dL (ref 12.0–15.0)
Potassium: 3.3 mmol/L — ABNORMAL LOW (ref 3.5–5.1)
Sodium: 140 mmol/L (ref 135–145)
TCO2: 22 mmol/L (ref 0–100)

## 2014-06-11 LAB — RAPID URINE DRUG SCREEN, HOSP PERFORMED
Amphetamines: NOT DETECTED
Barbiturates: NOT DETECTED
Benzodiazepines: NOT DETECTED
Cocaine: NOT DETECTED
Opiates: NOT DETECTED
Tetrahydrocannabinol: NOT DETECTED

## 2014-06-11 LAB — COMPREHENSIVE METABOLIC PANEL
ALT: 17 U/L (ref 0–35)
AST: 20 U/L (ref 0–37)
Albumin: 4.2 g/dL (ref 3.5–5.2)
Alkaline Phosphatase: 62 U/L (ref 39–117)
Anion gap: 10 (ref 5–15)
BUN: 7 mg/dL (ref 6–23)
CO2: 23 mmol/L (ref 19–32)
Calcium: 9.5 mg/dL (ref 8.4–10.5)
Chloride: 103 mmol/L (ref 96–112)
Creatinine, Ser: 0.94 mg/dL (ref 0.50–1.10)
GFR calc Af Amer: >90 mL/min (ref >90)
GFR calc non Af Amer: 84 mL/min — ABNORMAL LOW (ref >90)
Glucose, Bld: 85 mg/dL (ref 70–99)
Potassium: 3.3 mmol/L — ABNORMAL LOW (ref 3.5–5.1)
Sodium: 136 mmol/L (ref 135–145)
Total Bilirubin: 0.3 mg/dL (ref 0.3–1.2)
Total Protein: 6.8 g/dL (ref 6.0–8.3)

## 2014-06-11 LAB — URINALYSIS, ROUTINE W REFLEX MICROSCOPIC
Bilirubin Urine: NEGATIVE
Glucose, UA: NEGATIVE mg/dL
Hgb urine dipstick: NEGATIVE
Ketones, ur: NEGATIVE mg/dL
Leukocytes, UA: NEGATIVE
Nitrite: NEGATIVE
Protein, ur: NEGATIVE mg/dL
Specific Gravity, Urine: 1.012 (ref 1.005–1.030)
Urobilinogen, UA: 0.2 mg/dL (ref 0.0–1.0)
pH: 5.5 (ref 5.0–8.0)

## 2014-06-11 LAB — PROTIME-INR
INR: 1.1 (ref 0.00–1.49)
Prothrombin Time: 14.3 seconds (ref 11.6–15.2)

## 2014-06-11 LAB — I-STAT TROPONIN, ED: Troponin i, poc: 0 ng/mL (ref 0.00–0.08)

## 2014-06-11 LAB — APTT: aPTT: 32 seconds (ref 24–37)

## 2014-06-11 LAB — ETHANOL: Alcohol, Ethyl (B): <5 mg/dL (ref 0–9)

## 2014-06-11 MED ORDER — LORAZEPAM 2 MG/ML IJ SOLN
INTRAMUSCULAR | Status: AC
  Filled 2014-06-11: qty 1

## 2014-06-11 MED ORDER — SODIUM CHLORIDE 0.9 % IV SOLN
1000.0000 mg | INTRAVENOUS | Status: AC
  Administered 2014-06-11: 1000 mg via INTRAVENOUS
  Filled 2014-06-11: qty 10

## 2014-06-11 MED ORDER — SODIUM CHLORIDE 0.9 % IV SOLN
500.0000 mg | Freq: Two times a day (BID) | INTRAVENOUS | Status: DC
  Administered 2014-06-12 – 2014-06-13 (×3): 500 mg via INTRAVENOUS
  Filled 2014-06-11 (×5): qty 5

## 2014-06-11 MED ORDER — LORAZEPAM 2 MG/ML IJ SOLN
1.0000 mg | Freq: Once | INTRAMUSCULAR | Status: AC
  Administered 2014-06-11: 1 mg via INTRAVENOUS

## 2014-06-11 NOTE — ED Notes (Signed)
pre

## 2014-06-11 NOTE — ED Provider Notes (Signed)
CSN: [id number]     Arrival date & time 06/11/14  2102 History   First MD Initiated Contact with Patient 06/11/14 2115     Chief Complaint  Patient presents with  . Code Stroke     (Consider location/radiation/quality/duration/timing/severity/associated sxs/prior Treatment) Patient is a 24 y.o. female presenting with weakness. The history is provided by the patient and medical records.  Weakness This is a recurrent problem. The current episode started today (5:00 PM). The problem occurs constantly. The problem has been unchanged. Associated symptoms include headaches, nausea and weakness. Pertinent negatives include no chest pain, coughing or numbness. Nothing aggravates the symptoms. She has tried nothing for the symptoms. The treatment provided no relief.    Past Medical History  Diagnosis Date  . Interstitial cystitis   . Chronic pelvic pain in female   . Headache(784.0)   . Infection     UTI  . Pyelonephritis affecting pregnancy in first trimester July 2015  . Depression 2007  . Substance abuse   . Pneumonia 03/2014  . Hemiparesis ~ 05/15/2014    RUE/RLE "worse in my arm"   Past Surgical History  Procedure Laterality Date  . Vagina surgery  ~ 2011    tumor removed, went to cancer center in [facility]  . Cesarean section N/A 05/06/2014    Procedure: CESAREAN SECTION;  Surgeon: Levie Heritage, DO;  Location: [facility];  Service: Obstetrics;  Laterality: N/A;  . Inguinal hernia repair Left ~ 2014   Family History  Problem Relation Age of Onset  . Diabetes Mother   . Heart disease Mother   . Hypertension Father   . Diabetes Maternal Grandmother   . Hypertension Maternal Grandmother   . Stroke Maternal Grandmother   . Diabetes Maternal Grandfather   . Hypertension Maternal Grandfather   . Diabetes Paternal Grandmother   . Diabetes Paternal Grandfather    History  Substance Use Topics  . Smoking status: Current Every Day Smoker -- 0.50 packs/day for 8 years    Types: Cigarettes   . Smokeless tobacco: Never Used     Comment: cutting back  . Alcohol Use: No   OB History    Gravida Para Term Preterm AB TAB SAB Ectopic Multiple Living   0 1 0 1 0 0 2     Review of Systems  Respiratory: Negative for cough and shortness of breath.   Cardiovascular: Negative for chest pain.  Gastrointestinal: Positive for nausea.  Neurological: Positive for seizures, weakness and headaches. Negative for numbness.  All other systems reviewed and are negative.     Allergies  Ciprofloxacin  Home Medications   Prior to Admission medications   Medication Sig Start Date End Date Taking? Authorizing Provider  albuterol (PROVENTIL HFA;VENTOLIN HFA) 108 (90 BASE) MCG/ACT inhaler Inhale 2 puffs into the lungs every 6 (six) hours as needed for wheezing or shortness of breath. Patient not taking: Reported on 05/20/2014 03/09/14   Fredirick Lathe, MD  butalbital-acetaminophen-caffeine (FIORICET) [phone] MG per tablet Take 1 tablet by mouth every 6 (six) hours as needed for headache. Patient not taking: Reported on 05/17/2014 05/17/14 05/17/15  Marny Lowenstein, PA-C  enoxaparin (LOVENOX) 120 MG/0.8ML injection Inject 0.8 mLs (120 mg total) into the skin daily. 05/19/14   Vassie Loll, MD  ferrous sulfate 325 (65 FE) MG tablet Take 1 tablet (325 mg total) by mouth 3 (three) times daily with meals. Patient not taking: Reported on 05/20/2014 05/19/14   Vassie Loll, MD  methadone (  DOLOPHINE) 10 MG/5ML solution Take 50 mg by mouth daily.    Historical Provider, MD  methadone (DOLOPHINE) 10 MG/ML solution Take 64 mg by mouth daily.     Historical Provider, MD  ondansetron (ZOFRAN ODT) 8 MG disintegrating tablet Take 1 tablet (8 mg total) by mouth every 8 (eight) hours as needed for nausea or vomiting. 05/11/14   Adam Phenix, MD  ondansetron (ZOFRAN) 8 MG tablet Take 8 mg by mouth.    Historical Provider, MD  oxybutynin (DITROPAN) 5 MG tablet Take 1 tablet (5 mg total) by mouth 2 (two) times daily.  03/31/14   Levie Heritage, DO  oxyCODONE-acetaminophen (PERCOCET) 7.5-325 MG per tablet Take 1 tablet by mouth.    Historical Provider, MD  warfarin (COUMADIN) 5 MG tablet Take 7.5 mg daily until return in one week for INR> 05/20/14   Henrietta Hoover, NP   BP 115/62 mmHg  Pulse 100  Temp(Src) 98.1 F (36.7 C) (Oral)  Resp 17  Ht  (1.575 m)  Wt 175 lb (79.379 kg)  BMI 32.00 kg/m2  SpO2 98% Physical Exam  Constitutional: She is oriented to person, place, and time. She appears well-developed and well-nourished. No distress.  HENT:  Head: Normocephalic and atraumatic.  Eyes: Conjunctivae are normal. Pupils are equal, round, and reactive to light.  Cardiovascular: Normal rate, regular rhythm, normal heart sounds and intact distal pulses.  Exam reveals no gallop and no friction rub.   No murmur heard. Pulmonary/Chest: Effort normal and breath sounds normal. No respiratory distress. She has no wheezes. She has no rales.  Abdominal: Soft. She exhibits no distension. There is no tenderness.  Neurological: She is alert and oriented to person, place, and time. No cranial nerve deficit or sensory deficit. Coordination and gait normal.  4/5 strength in right upper extremity. 5/5 strength in right lower extremity and left upper and lower extremities.  Skin: Skin is warm and dry. No rash noted. She is not diaphoretic.  Psychiatric: She has a normal mood and affect. Her behavior is normal. Judgment and thought content normal.  Nursing note and vitals reviewed.   ED Course  Procedures (including critical care time) Labs Review Labs Reviewed  CBC - Abnormal; Notable for the following:    Hemoglobin 11.0 (*)    HCT 35.1 (*)    MCH 25.9 (*)    RDW 16.8 (*)    All other components within normal limits  COMPREHENSIVE METABOLIC PANEL - Abnormal; Notable for the following:    Potassium 3.3 (*)    GFR calc non Af Amer 84 (*)    All other components within normal limits  URINALYSIS, ROUTINE W  REFLEX MICROSCOPIC - Abnormal; Notable for the following:    APPearance CLOUDY (*)    All other components within normal limits  I-STAT CHEM 8, ED - Abnormal; Notable for the following:    Potassium 3.3 (*)    All other components within normal limits  ETHANOL  PROTIME-INR  APTT  DIFFERENTIAL  URINE RAPID DRUG SCREEN (HOSP PERFORMED)  I-STAT TROPOININ, ED  I-STAT TROPOININ, ED  POC URINE PREG, ED    Imaging Review Ct Head Wo Contrast  06/11/2014   CLINICAL DATA:  Right arm numbness, headache and lightheadedness. Venous infarction 3 weeks ago in the high left frontal region.  EXAM: CT HEAD WITHOUT CONTRAST  TECHNIQUE: Contiguous axial images were obtained from the base of the skull through the vertex without intravenous contrast.  COMPARISON:  MRI 05/17/2014  FINDINGS: The high left frontal venous infarction is visible as an ill-defined area of low attenuation on this scan. There is no hemorrhagic conversion of the infarction. Remainder of the brain is also negative for intracranial hemorrhage, mass or acute infarction. Ventricles and basal cisterns are normal in size and configuration. Gray matter and white matter are normal except for the subacute high left frontal convexity venous infarction. No significant bony abnormalities are evident.  IMPRESSION: Negative for acute intracranial hemorrhage or acute infarction. The subacute high left frontal venous infarction is visible, without hemorrhagic conversion and without significant mass effect. Critical Value/emergent results were called by telephone at the time of interpretation on 06/11/2014 at 9:35 pm to Dr. Blane Ohara , who verbally acknowledged these results.   Electronically Signed   By: Ellery Plunk M.D.   On: 06/11/2014 21:37     EKG Interpretation None      MDM   Final diagnoses:  Seizure  Right arm weakness   24 year old female with history of postpartum stroke presents with right sided weakness. Code stroke called from  the lobby. CT head showed previous high left frontal venous infarct without hemorrhagic conversion or mass effect. Vital signs are stable on arrival without hypertension. Stroke labs were sent. On repeat evaluation, the patient had a period of unresponsiveness with right-sided shaking. This lasted approximately 15-30 seconds and then was followed by a postictal period. Given new worsening deficits with new seizure, concern is for possible acute stroke, venous sinus thrombosis, or seizure related to previous stroke. Ativan was given during the seizure episode and Keppra following the seizure. MRI as well as MRV were ordered and the patient will need to be admitted for further evaluation and monitoring. I discussed the patient with the hospitalist who accepted the patient to telemetry bed. Following her postictal period, the patient did return to baseline. Laboratory workup returned unremarkable other than subtherapeutic Coumadin at one. Eclampsia was also considered given her recent delivery, however it was felt this is more likely related to one of the above-mentioned differentials given her recent medical history and presentation.  Dorna Leitz, MD 06/11/14 [id number]  Blane Ohara, MD 06/12/14 [phone]

## 2014-06-11 NOTE — ED Notes (Signed)
[facility] contacted to page Code Stroke 

## 2014-06-11 NOTE — ED Notes (Signed)
Presents with right arm numbness, sudden onset of headache and dizziness. Began at 5 pm. Pt had CVA 3 weeks ago, she is 8 weeks post partum, taking coumadin.

## 2014-06-11 NOTE — Consult Note (Addendum)
Referring Physician: ED    Chief Complaint: right arm numbness/heaviness, HA  HPI:                                                                                                                                         Victoria Alvarez is an 24 y.o. 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 She is on coumadin and said that she has been doing well until this morning when developed a severe HA, and then around 5 pm noted numbness-heaviness of the right hand that moved to the arm, symptoms similar to what she experienced at the time of her recent stroke. No associated vertigo but complains of blurred and double vision. No difficulty swallowing, slurred speech, unsteadiness, weakness right left or left side, or language impairment. NIHSS 3. CT brain was personally reviewed and is negative for acute intracranial hemorrhage or acute infarction. The subacute high left frontal venous infarction is visible, without hemorrhagic conversion and without significant mass effect. INR 1.10 Presently, complains of HA and improving numbness-heaviness right hand.  Date last known well: 06/11/14 Time last known well: 5 pm tPA Given: no, mild deficits, coumadin NIHSS: 3   Past Medical History  Diagnosis Date  . Interstitial cystitis   . Chronic pelvic pain in female   . Headache(784.0)   . Infection     UTI  . Pyelonephritis affecting pregnancy in first trimester July 2015  . Depression 2007  . Substance abuse   . Pneumonia 03/2014  . Hemiparesis ~ 05/15/2014    RUE/RLE "worse in my arm"    Past Surgical History  Procedure Laterality Date  . Vagina surgery  ~ 2011    tumor removed, went to  cancer center in [facility]  . Cesarean section N/A 05/06/2014    Procedure: CESAREAN SECTION;  Surgeon: Truett Mainland, DO;  Location: [facility];  Service: Obstetrics;  Laterality: N/A;  . Inguinal hernia repair Left ~ 2014    Family History  Problem Relation Age of Onset  . Diabetes Mother   . Heart disease Mother   . Hypertension Father   . Diabetes Maternal Grandmother   . Hypertension Maternal Grandmother   . Stroke Maternal Grandmother   . Diabetes Maternal Grandfather   . Hypertension Maternal Grandfather   . Diabetes Paternal Grandmother   . Diabetes Paternal Grandfather    Social History:  reports that she has been smoking Cigarettes.  She has a 4 pack-year smoking history. She has never used smokeless tobacco. She reports that she does not drink alcohol or use illicit drugs.  Family history: no brain tumors, brain aneurysms, or epilepsy  Allergies:  Allergies  Allergen Reactions  . Ciprofloxacin Diarrhea and Nausea And Vomiting    Medications:                                                                                                                           I have reviewed the patient's current medications.  ROS:                                                                                                                                       History obtained from the patient  General ROS: negative for - chills, fatigue, fever, night sweats, or weight loss Psychological ROS: negative for - behavioral disorder, hallucinations, memory difficulties, mood swings or suicidal ideation Ophthalmic ROS: negative for - blurry vision, double vision, eye pain or loss of vision ENT ROS: negative for - epistaxis, nasal discharge, oral lesions, sore throat, tinnitus or vertigo Allergy and Immunology ROS: negative for - hives or itchy/watery eyes Hematological and Lymphatic ROS: negative for - bleeding problems, bruising or swollen lymph nodes Endocrine ROS: negative for -  galactorrhea, hair pattern changes, polydipsia/polyuria or temperature intolerance Respiratory ROS: negative for - cough, hemoptysis, shortness of breath or wheezing Cardiovascular ROS: negative for - chest pain, dyspnea on exertion, edema or irregular heartbeat Gastrointestinal ROS: negative for - abdominal pain, diarrhea, hematemesis, nausea/vomiting or stool incontinence Genito-Urinary ROS: negative for - dysuria, hematuria, incontinence or urinary frequency/urgency Musculoskeletal ROS: negative for - joint swelling Neurological ROS: as noted in HPI Dermatological ROS: negative for rash and skin lesion changes  Physical exam: pleasant female in no apparent distress. Blood pressure 123/55, pulse 77, temperature 98.1 F (36.7 C), temperature source Oral, resp. rate 18, height 5' 2"  (1.575 m), weight 79.379 kg (175 lb), SpO2 100 %, unknown if currently breastfeeding. Head: normocephalic. Neck: supple, no bruits, no JVD. Cardiac: no murmurs. Lungs: clear. Abdomen: soft, no tender, no mass. Extremities: no edema. Skin: no rash Neurologic Examination:  General: Mental Status: Alert, oriented, thought content appropriate.  Speech fluent without evidence of aphasia.  Able to follow 3 step commands without difficulty. Cranial Nerves: II: Discs flat bilaterally; Visual fields grossly normal, pupils equal, round, reactive to light and accommodation III,IV, VI: ptosis not present, extra-ocular motions intact bilaterally V,VII: smile symmetric, facial light touch sensation normal bilaterally VIII: hearing normal bilaterally IX,X: uvula rises symmetrically XI: bilateral shoulder shrug XII: midline tongue extension without atrophy or fasciculations  Motor: Right : Upper extremity   5/5    Left:     Upper extremity   5/5  Lower extremity   5/5     Lower extremity   5/5 Tone and bulk:normal tone  throughout; no atrophy noted Sensory: Pinprick and light touch mildly diminished right hand-arm Deep Tendon Reflexes:  Right: Upper Extremity   Left: Upper extremity   biceps (C-5 to C-6) 2/4   biceps (C-5 to C-6) 2/4 tricep (C7) 2/4    triceps (C7) 2/4 Brachioradialis (C6) 2/4  Brachioradialis (C6) 2/4  Lower Extremity Lower Extremity  quadriceps (L-2 to L-4) 2/4   quadriceps (L-2 to L-4) 2/4 Achilles (S1) 2/4   Achilles (S1) 2/4  Plantars: Right: downgoing   Left: downgoing Cerebellar: normal finger-to-nose,  normal heel-to-shin test Gait:  No tested for safety reasons     Results for orders placed or performed during the hospital encounter of 06/11/14 (from the past 48 hour(s))  I-Stat Chem 8, ED     Status: Abnormal   Collection Time: 06/11/14  9:35 PM  Result Value Ref Range   Sodium 140 135 - 145 mmol/L   Potassium 3.3 (L) 3.5 - 5.1 mmol/L   Chloride 104 96 - 112 mmol/L   BUN 8 6 - 23 mg/dL   Creatinine, Ser 0.90 0.50 - 1.10 mg/dL   Glucose, Bld 84 70 - 99 mg/dL   Calcium, Ion 1.17 1.12 - 1.23 mmol/L   TCO2 22 0 - 100 mmol/L   Hemoglobin 12.2 12.0 - 15.0 g/dL   HCT 36.0 36.0 - 46.0 %   Ct Head Wo Contrast  06/11/2014   CLINICAL DATA:  Right arm numbness, headache and lightheadedness. Venous infarction 3 weeks ago in the high left frontal region.  EXAM: CT HEAD WITHOUT CONTRAST  TECHNIQUE: Contiguous axial images were obtained from the base of the skull through the vertex without intravenous contrast.  COMPARISON:  MRI 05/17/2014  FINDINGS: The high left frontal venous infarction is visible as an ill-defined area of low attenuation on this scan. There is no hemorrhagic conversion of the infarction. Remainder of the brain is also negative for intracranial hemorrhage, mass or acute infarction. Ventricles and basal cisterns are normal in size and configuration. Gray matter and white matter are normal except for the subacute high left frontal convexity venous infarction. No  significant bony abnormalities are evident.  IMPRESSION: Negative for acute intracranial hemorrhage or acute infarction. The subacute high left frontal venous infarction is visible, without hemorrhagic conversion and without significant mass effect. Critical Value/emergent results were called by telephone at the time of interpretation on 06/11/2014 at 9:35 pm to Dr. Elnora Morrison , who verbally acknowledged these results.   Electronically Signed   By: Andreas Newport M.D.   On: 06/11/2014 21:37    Assessment: 24 y.o. 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 CT head without acute hemorrhage or ischemia. NIHSS 3. INR 1.10 She is sub therapeutic on coumadin and thus an acute left brain infarct is feasible, but patient with minimal deficits at this time, very recent stroke, thus will not pursue thrombolysis.  Admit to medicine. Ordered MRI/MRV brain for better elucidation of her symptoms. Coumadin per pharmacy. Stroke team will follow up in the morning.  Lupita Shutter ,MD [facility] [phone] 06/11/2014, 9:41 PM   Addendum: patient sustained a witnessed seizure while being assisted to use bedside commode: " as patient was getting back in bed, pt became frantic, reporting right hand cramping. Pt placed in bed, then began having full body decorticate contractions lasting approx 30 seconds". Received 1 mg IV ativan, order for 1 gram IV keppra given, no further seizures noted. New onset acute symptomatic seizure in the context of CVT with new cerebral infarct versus seizure resulting from recent left dominant venous cortical infarction. Will continue keppra 500 mg BID for  now.  Addendum: MRI brain revealed no acute ischemia. Further propagation of superior sagittal sinus and cortical vein thrombosis without venous infarction   MRV brain:  Further propagation of superior sagittal sinus thrombosis involving proximal and mid segments. Patient has sub-therapeutic INR. Started IV heparin. Stroke team to follow up in the morning.

## 2014-06-11 NOTE — ED Notes (Signed)
Pt now awake, drowsy, oriented to self and place.

## 2014-06-11 NOTE — ED Notes (Signed)
Patient transported to MRI 

## 2014-06-11 NOTE — ED Notes (Signed)
Assisted patient to use bedside commode, as patient was getting back in bed, pt became frantic,  reporting right hand cramping.  Pt placed in bed, then began having full body decorticate contractions lasting approx 30 seconds. md notified, orders obtained.  Airway remained intact throughout, non-rebreather applied w/15liters o2.  Pt then noted to be lying in bed w/body relaxed, somnolent, unable to answer questions, but maintaining airway with stable vital signs.  Dr's zavitz and camillo at bedside.

## 2014-06-12 ENCOUNTER — Encounter (HOSPITAL_COMMUNITY): Payer: Self-pay | Admitting: Internal Medicine

## 2014-06-12 ENCOUNTER — Inpatient Hospital Stay (HOSPITAL_COMMUNITY): Payer: Medicaid Other

## 2014-06-12 DIAGNOSIS — I676 Nonpyogenic thrombosis of intracranial venous system: Secondary | ICD-10-CM

## 2014-06-12 DIAGNOSIS — G08 Intracranial and intraspinal phlebitis and thrombophlebitis: Principal | ICD-10-CM

## 2014-06-12 DIAGNOSIS — F1910 Other psychoactive substance abuse, uncomplicated: Secondary | ICD-10-CM

## 2014-06-12 DIAGNOSIS — I638 Other cerebral infarction: Secondary | ICD-10-CM

## 2014-06-12 DIAGNOSIS — D509 Iron deficiency anemia, unspecified: Secondary | ICD-10-CM

## 2014-06-12 DIAGNOSIS — I6389 Other cerebral infarction: Secondary | ICD-10-CM | POA: Insufficient documentation

## 2014-06-12 HISTORY — DX: Intracranial and intraspinal phlebitis and thrombophlebitis: G08

## 2014-06-12 LAB — PROTIME-INR
INR: 1.32 (ref 0.00–1.49)
Prothrombin Time: 16.5 seconds — ABNORMAL HIGH (ref 11.6–15.2)

## 2014-06-12 LAB — CBC WITH DIFFERENTIAL/PLATELET
Basophils Absolute: 0 10*3/uL (ref 0.0–0.1)
Basophils Relative: 0 % (ref 0–1)
Eosinophils Absolute: 0.1 10*3/uL (ref 0.0–0.7)
Eosinophils Relative: 3 % (ref 0–5)
HCT: 30.4 % — ABNORMAL LOW (ref 36.0–46.0)
Hemoglobin: 9.6 g/dL — ABNORMAL LOW (ref 12.0–15.0)
Lymphocytes Relative: 45 % (ref 12–46)
Lymphs Abs: 2.6 10*3/uL (ref 0.7–4.0)
MCH: 26.4 pg (ref 26.0–34.0)
MCHC: 31.6 g/dL (ref 30.0–36.0)
MCV: 83.5 fL (ref 78.0–100.0)
Monocytes Absolute: 0.3 10*3/uL (ref 0.1–1.0)
Monocytes Relative: 4 % (ref 3–12)
Neutro Abs: 2.7 10*3/uL (ref 1.7–7.7)
Neutrophils Relative %: 48 % (ref 43–77)
Platelets: 230 10*3/uL (ref 150–400)
RBC: 3.64 MIL/uL — ABNORMAL LOW (ref 3.87–5.11)
RDW: 16.8 % — ABNORMAL HIGH (ref 11.5–15.5)
WBC: 5.7 10*3/uL (ref 4.0–10.5)

## 2014-06-12 LAB — BLOOD GAS, ARTERIAL
Acid-base deficit: 1.7 mmol/L (ref 0.0–2.0)
Bicarbonate: 22.8 mEq/L (ref 20.0–24.0)
Drawn by: 43098
FIO2: 0.21 %
O2 Saturation: 96 %
Patient temperature: 98.6
TCO2: 24 mmol/L (ref 0–100)
pCO2 arterial: 40.6 mmHg (ref 35.0–45.0)
pH, Arterial: 7.368 (ref 7.350–7.450)
pO2, Arterial: 77.5 mmHg — ABNORMAL LOW (ref 80.0–100.0)

## 2014-06-12 LAB — COMPREHENSIVE METABOLIC PANEL
ALT: 14 U/L (ref 0–35)
AST: 15 U/L (ref 0–37)
Albumin: 3.3 g/dL — ABNORMAL LOW (ref 3.5–5.2)
Alkaline Phosphatase: 50 U/L (ref 39–117)
Anion gap: 8 (ref 5–15)
BUN: 9 mg/dL (ref 6–23)
CO2: 23 mmol/L (ref 19–32)
Calcium: 8.5 mg/dL (ref 8.4–10.5)
Chloride: 111 mmol/L (ref 96–112)
Creatinine, Ser: 0.74 mg/dL (ref 0.50–1.10)
GFR calc Af Amer: >90 mL/min (ref >90)
GFR calc non Af Amer: >90 mL/min (ref >90)
Glucose, Bld: 102 mg/dL — ABNORMAL HIGH (ref 70–99)
Potassium: 3.5 mmol/L (ref 3.5–5.1)
Sodium: 142 mmol/L (ref 135–145)
Total Bilirubin: 0.3 mg/dL (ref 0.3–1.2)
Total Protein: 5.3 g/dL — ABNORMAL LOW (ref 6.0–8.3)

## 2014-06-12 LAB — MRSA PCR SCREENING: MRSA by PCR: NEGATIVE

## 2014-06-12 LAB — TROPONIN I: Troponin I: <0.03 ng/mL (ref <0.031)

## 2014-06-12 LAB — LACTIC ACID, PLASMA: Lactic Acid, Venous: 0.8 mmol/L (ref 0.5–2.0)

## 2014-06-12 MED ORDER — HEPARIN (PORCINE) IN NACL 100-0.45 UNIT/ML-% IJ SOLN
1000.0000 [IU]/h | INTRAMUSCULAR | Status: DC
  Administered 2014-06-12: 1000 [IU]/h via INTRAVENOUS
  Filled 2014-06-12: qty 250

## 2014-06-12 MED ORDER — TRAMADOL HCL 50 MG PO TABS
50.0000 mg | ORAL_TABLET | Freq: Once | ORAL | Status: AC
Start: 2014-06-12 — End: 2014-06-12
  Administered 2014-06-12: 50 mg via ORAL
  Filled 2014-06-12: qty 1

## 2014-06-12 MED ORDER — SODIUM CHLORIDE 0.9 % IV BOLUS (SEPSIS)
1000.0000 mL | Freq: Once | INTRAVENOUS | Status: AC
  Administered 2014-06-12: 1000 mL via INTRAVENOUS

## 2014-06-12 MED ORDER — WARFARIN SODIUM 5 MG PO TABS
5.0000 mg | ORAL_TABLET | Freq: Once | ORAL | Status: AC
  Administered 2014-06-12: 5 mg via ORAL
  Filled 2014-06-12 (×2): qty 1

## 2014-06-12 MED ORDER — APIXABAN 5 MG PO TABS: 5.0000 mg | ORAL_TABLET | Freq: Two times a day (BID) | ORAL | Status: DC

## 2014-06-12 MED ORDER — APIXABAN 5 MG PO TABS
10.0000 mg | ORAL_TABLET | Freq: Two times a day (BID) | ORAL | Status: DC
  Administered 2014-06-12 – 2014-06-13 (×3): 10 mg via ORAL
  Filled 2014-06-12 (×4): qty 2

## 2014-06-12 MED ORDER — HEPARIN BOLUS VIA INFUSION
4000.0000 [IU] | Freq: Once | INTRAVENOUS | Status: AC
  Administered 2014-06-12: 4000 [IU] via INTRAVENOUS
  Filled 2014-06-12: qty 4000

## 2014-06-12 MED ORDER — ACETAMINOPHEN 325 MG PO TABS
650.0000 mg | ORAL_TABLET | Freq: Four times a day (QID) | ORAL | Status: DC | PRN
  Administered 2014-06-12 – 2014-06-13 (×3): 650 mg via ORAL
  Filled 2014-06-12 (×3): qty 2

## 2014-06-12 MED ORDER — METHADONE HCL 10 MG/5ML PO SOLN: 50.0000 mg | Freq: Every day | ORAL | Status: DC

## 2014-06-12 MED ORDER — METHADONE HCL 10 MG/5ML PO SOLN
72.0000 mg | Freq: Every day | ORAL | Status: DC
  Filled 2014-06-12 (×9): qty 36

## 2014-06-12 MED ORDER — METHADONE HCL 10 MG PO TABS
70.0000 mg | ORAL_TABLET | Freq: Every day | ORAL | Status: DC
  Administered 2014-06-12 – 2014-06-13 (×2): 70 mg via ORAL
  Filled 2014-06-12 (×2): qty 7

## 2014-06-12 MED ORDER — LORAZEPAM 2 MG/ML IJ SOLN
INTRAMUSCULAR | Status: AC
  Filled 2014-06-12: qty 1

## 2014-06-12 MED ORDER — SODIUM CHLORIDE 0.9 % IV BOLUS (SEPSIS)
500.0000 mL | Freq: Once | INTRAVENOUS | Status: AC
  Administered 2014-06-12: 500 mL via INTRAVENOUS

## 2014-06-12 MED ORDER — SODIUM CHLORIDE 0.9 % IV SOLN
INTRAVENOUS | Status: AC
Start: 2014-06-12 — End: 2014-06-13
  Administered 2014-06-12: 125 mL via INTRAVENOUS
  Administered 2014-06-12 (×2): via INTRAVENOUS

## 2014-06-12 MED ORDER — WARFARIN - PHARMACIST DOSING INPATIENT: Freq: Every day | Status: DC

## 2014-06-12 MED ORDER — LORAZEPAM 2 MG/ML IJ SOLN
0.5000 mg | Freq: Once | INTRAMUSCULAR | Status: AC
  Administered 2014-06-12: 0.5 mg via INTRAVENOUS

## 2014-06-12 NOTE — Progress Notes (Signed)
Pt's systolic BP was running on the 80's. Dr. Toniann Fail was notified and had seen pt at bedside. MD ordered to transfer pt to stepdown 3South rm4 for further monitoring and management. 2,582ml of NS bolus given. Report given to [facility], Charity fundraiser.

## 2014-06-12 NOTE — Progress Notes (Signed)
  ANTICOAGULATION CONSULT NOTE - Follow Up Consult  Pharmacy Consult for Apixaban Indication: Cortical vein thrombosis, sagittal sinus thrombosis  Allergies  Allergen Reactions  . Ciprofloxacin Diarrhea and Nausea And Vomiting    Patient Measurements: Height: 5\' 2"  (157.5 cm) Weight: 177 lb 7.5 oz (80.5 kg) IBW/kg (Calculated) : 50.1  Vital Signs: Temp: 98.2 F (36.8 C) (04/03 0700) Temp Source: Oral (04/03 0700) BP: 96/49 mmHg (04/03 0800) Pulse Rate: 72 (04/03 0800)  Labs:  Recent Labs  06/11/14 2122 06/11/14 2135 06/12/14 0305  HGB 11.0* 12.2 9.6*  HCT 35.1* 36.0 30.4*  PLT 258  --  230  APTT 32  --   --   LABPROT 14.3  --  16.5*  INR 1.10  --  1.32  CREATININE 0.94 0.90 0.74    Estimated Creatinine Clearance: 106.6 mL/min (by C-G formula based on Cr of 0.74).   Medications:  Scheduled:  . apixaban  10 mg Oral BID   Followed by  . [START ON 06/19/2014] apixaban  5 mg Oral BID  . levETIRAcetam  500 mg Intravenous Q12H  . 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 INR on admission was subtherapeutic at 1.1. Patient was initially being bridged with IV heparin.   Pharmacy consulted today to dose Apixaban for cortical vein thrombosis. Warfarin and heparin discontinued (Heparin off). Per MD's note, CT showed propagation of thrombosis, so will use treatment dose.  SCr stable at 0.74, Hgb 9.6, platelets stable at 230. No signs of bleeding noted.  Goal of Therapy:  Monitor platelets by anticoagulation protocol: Yes   Plan:  -Start Treatment Dose Apixaban 10 mg BID for 7 days, followed by 5 mg BID -Monitor renal function, hgb, plts, signs of bleeding -New patient education  Russ Halo, PharmD Clinical Pharmacist - Resident Pager: [phone] 4/3/201610:20 AM

## 2014-06-12 NOTE — Progress Notes (Signed)
Patient transferred from 5W via bed on telemetry. Patient AAOx4, VSS in no acute distress. Patient oriented to unit and room, instructed on callbell and placed at side. Bed alarm on. Patient's husband and child at bedside with belongings. Will continue to monitor.

## 2014-06-12 NOTE — Progress Notes (Addendum)
Patient seen and examined Continues to have right upper and lower extremity weakness No complaints of bleeding, heavy menstrual periods  Discussed with the patient's husband and Dr. Erlinda Hong, neurology  Plan is to discontinue heparin drip and Coumadin and switch patient to Eliquis Patient to be seen by stroke team tomorrow prior to discharge

## 2014-06-12 NOTE — H&P (Addendum)
[facility] History and Physical  Victoria Alvarez NGE:[id number] DOB: 01/10/1991 DOA: 06/11/2014  Referring physician: ER physician. PCP: LAND, PHILLIP, PA-C  Chief Complaint: Headache with right-sided upper extremity numbness.  HPI: Victoria Alvarez is a 24 y.o. 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 Patient's symptoms started around 5 PM yesterday. CT head was negative for anything acute. MRI and MRV of the brain was done which did not show any acute infarct but did show propagation of patient's known sagittal sinus thrombosis and cortical vein thrombosis. Patient's INR is subtherapeutic. In the ER patient had a brief episode of seizure. Patient was found to have cramping of her right upper extremity followed by involving the whole body and patient had a brief postictal phase. Patient was given Ativan followed by Keppra loading dose as recommended by neurologist. On exam patient is quite sedated and follows commands very minimally.  Review of Systems: As presented in the history of presenting illness, rest negative.  Past Medical History  Diagnosis Date  . Interstitial cystitis   . Chronic pelvic pain in female   . Headache(784.0)   . Infection     UTI  . Pyelonephritis affecting pregnancy in first trimester July 2015  . Depression 2007  . Substance abuse   . Pneumonia 03/2014  . Hemiparesis ~ 05/15/2014    RUE/RLE "worse in my arm"   Past Surgical History  Procedure Laterality Date  . Vagina surgery  ~ 2011    tumor removed, went to cancer center in [facility]  . Cesarean section N/A 05/06/2014    Procedure: CESAREAN SECTION;  Surgeon: Truett Mainland, DO;  Location: [facility];  Service: Obstetrics;  Laterality: N/A;  . Inguinal hernia repair Left ~ 2014   Social History:  reports that she has been smoking Cigarettes.  She has a 4 pack-year smoking history. She has never used smokeless tobacco. She reports that she does not drink alcohol or use illicit drugs. Where does patient lives home.  Can patient participate in ADLs? Yes.  Allergies  Allergen Reactions  . Ciprofloxacin Diarrhea and Nausea And Vomiting    Family History:  Family History  Problem Relation Age of Onset  . Diabetes Mother   . Heart disease Mother   . Hypertension Father   . Diabetes Maternal Grandmother   . Hypertension Maternal Grandmother   . Stroke Maternal Grandmother   . Diabetes Maternal Grandfather   . Hypertension Maternal Grandfather   . Diabetes Paternal Grandmother   . Diabetes Paternal Grandfather       Prior to Admission medications   Medication Sig Start Date End Date Taking? Authorizing Provider  albuterol (PROVENTIL HFA;VENTOLIN HFA) 108 (90 BASE) MCG/ACT inhaler Inhale 2 puffs into the lungs every 6 (six) hours as needed for wheezing or shortness of breath. Patient not taking: Reported on 05/20/2014 03/09/14   Nila Nephew, MD  butalbital-acetaminophen-caffeine (FIORICET) [phone] MG per tablet Take 1 tablet by mouth every 6 (six) hours as needed for headache. Patient not taking: Reported on 05/17/2014 05/17/14 05/17/15  Luvenia Redden, PA-C  enoxaparin (LOVENOX) 120 MG/0.8ML injection Inject 0.8 mLs (120 mg total) into the skin daily. 05/19/14   Barton Dubois, MD  ferrous sulfate  325 (65 FE) MG tablet Take 1 tablet (325 mg total) by mouth 3 (three) times daily with meals. Patient not taking: Reported on 05/20/2014 05/19/14   Barton Dubois, MD  methadone (DOLOPHINE) 10 MG/5ML solution Take 50 mg by mouth daily.    Historical Provider, MD  methadone (DOLOPHINE) 10 MG/ML solution Take 64 mg by mouth daily.     Historical Provider, MD  ondansetron  (ZOFRAN ODT) 8 MG disintegrating tablet Take 1 tablet (8 mg total) by mouth every 8 (eight) hours as needed for nausea or vomiting. 05/11/14   Woodroe Mode, MD  ondansetron (ZOFRAN) 8 MG tablet Take 8 mg by mouth.    Historical Provider, MD  oxybutynin (DITROPAN) 5 MG tablet Take 1 tablet (5 mg total) by mouth 2 (two) times daily. 03/31/14   Truett Mainland, DO  oxyCODONE-acetaminophen (PERCOCET) 7.5-325 MG per tablet Take 1 tablet by mouth.    Historical Provider, MD  warfarin (COUMADIN) 5 MG tablet Take 7.5 mg daily until return in one week for INR> 05/20/14   Micheline Chapman, NP    Physical Exam: Filed Vitals:   06/11/14 2220 06/11/14 2230 06/12/14 0044 06/12/14 0129  BP:  115/62 100/43 78/33  Pulse: 104 100 72 74  Temp:   98.7 F (37.1 C) 97.4 F (36.3 C)  TempSrc:   Oral Axillary  Resp: 22 17 18 16   Height:      Weight:      SpO2: 100% 98% 91% 96%     General:  Well-developed and nourished.  Eyes: Anicteric no pallor.  ENT: No discharge from the years eyes nose and mouth.  Neck: No mass felt. No neck rigidity.  Cardiovascular: S1-S2 heard.  Respiratory: No rhonchi or crepitations.  Abdomen: Soft nontender bowel sounds present.  Skin: No rash.  Musculoskeletal: No edema.  Psychiatric: Patient is sedated.  Neurologic: Patient is sedated and follows minimal commands at this time. Patient did receive Ativan in the ER and also Keppra. PERRLA positive. No facial asymmetry. Moves all extremities.  Labs on Admission:  Basic Metabolic Panel:  Recent Labs Lab 06/11/14 2122 06/11/14 2135  NA 136 140  K 3.3* 3.3*  CL 103 104  CO2 23  --   GLUCOSE 85 84  BUN 7 8  CREATININE 0.94 0.90  CALCIUM 9.5  --    Liver Function Tests:  Recent Labs Lab 06/11/14 2122  AST 20  ALT 17  ALKPHOS 62  BILITOT 0.3  PROT 6.8  ALBUMIN 4.2   No results for input(s): LIPASE, AMYLASE in the last 168 hours. No results for input(s): AMMONIA in the last 168  hours. CBC:  Recent Labs Lab 06/11/14 2122 06/11/14 2135  WBC 6.5  --   NEUTROABS 3.2  --   HGB 11.0* 12.2  HCT 35.1* 36.0  MCV 82.6  --   PLT 258  --    Cardiac Enzymes: No results for input(s): CKTOTAL, CKMB, CKMBINDEX, TROPONINI in the last 168 hours.  BNP (last 3 results) No results for input(s): BNP in the last 8760 hours.  ProBNP (last 3 results) No results for input(s): PROBNP in the last 8760 hours.  CBG: No results for input(s): GLUCAP in the last 168 hours.  Radiological Exams on Admission: Ct Head Wo Contrast  06/11/2014   CLINICAL DATA:  Right arm numbness, headache and lightheadedness. Venous infarction 3 weeks ago in the high left frontal region.  EXAM: CT HEAD WITHOUT CONTRAST  TECHNIQUE: Contiguous axial images were  obtained from the base of the skull through the vertex without intravenous contrast.  COMPARISON:  MRI 05/17/2014  FINDINGS: The high left frontal venous infarction is visible as an ill-defined area of low attenuation on this scan. There is no hemorrhagic conversion of the infarction. Remainder of the brain is also negative for intracranial hemorrhage, mass or acute infarction. Ventricles and basal cisterns are normal in size and configuration. Gray matter and white matter are normal except for the subacute high left frontal convexity venous infarction. No significant bony abnormalities are evident.  IMPRESSION: Negative for acute intracranial hemorrhage or acute infarction. The subacute high left frontal venous infarction is visible, without hemorrhagic conversion and without significant mass effect. Critical Value/emergent results were called by telephone at the time of interpretation on 06/11/2014 at 9:35 pm to Dr. Elnora Morrison , who verbally acknowledged these results.   Electronically Signed   By: Andreas Newport M.D.   On: 06/11/2014 21:37   Mr Brain Wo Contrast  06/12/2014   CLINICAL DATA:  Hypercoagulable patient, postpartum status post LEFT parietal  subacute cortical infarction due to cortical vein thrombosis. On Coumadin, developed a severe headache this morning, RIGHT hand numbness and heaviness around 5 p.m. Symptoms similar to prior stroke.  EXAM: MRI HEAD WITHOUT CONTRAST  MRV HEAD WITHOUT CONTRAST  TECHNIQUE: Multiplanar, multiecho pulse sequences of the brain and surrounding structures were obtained without intravenous contrast. Angiographic images of the intracranial venous structures were obtained using MRV technique without intravenous contrast.  COMPARISON:  MRI and MRV of the brain May 17, 2014  FINDINGS: MRI HEAD: No reduced diffusion to suggest acute ischemia ; resolution of reduced diffusion in LEFT posterior frontal lobe from prior study. However, abnormal T2 bright signal and susceptibility artifact in the superior sagittal sinus, and the LEFT cortical veins, to lesser extent on the RIGHT, progressed from prior imaging. No intraparenchymal susceptibility artifact. Ventricles and sulci are normal for patient's age. Faint residual FLAIR T2 hyperintense signal about the LEFT central sulcus, improved from prior imaging. No satellite areas of abnormal signal.  No abnormal extra-axial fluid collections. Normal major intracranial vascular flow voids seen at the skull base.  Ocular globes and orbital contents are unremarkable. Paranasal sinuses and mastoid air cells are well aerated. No abnormal sellar expansion. No cerebellar tonsillar ectopia.  MRV HEAD: Further propagation superior sagittal sinus thrombosis, with the anterior and mid segments collecting flow related enhancement. Patent torcula of herophili, transverse sigmoid and internal jugular veins. Normal flow related enhancement of the internal cerebral veins.  IMPRESSION: MRI HEAD: Further propagation of superior sagittal sinus and cortical vein thrombosis without venous infarction. Small area of residual LEFT frontal gliosis.  MRA HEAD: Further propagation of superior sagittal sinus  thrombosis involving proximal and mid segments.  Acute findings discussed with and reconfirmed by Dr.OSVALDO CAMILO on 06/12/2014 at 12:40 am.   Electronically Signed   By: Elon Alas   On: 06/12/2014 00:51   Mr Venogram Head  06/12/2014   CLINICAL DATA:  Hypercoagulable patient, postpartum status post LEFT parietal subacute cortical infarction due to cortical vein thrombosis. On Coumadin, developed a severe headache this morning, RIGHT hand numbness and heaviness around 5 p.m. Symptoms similar to prior stroke.  EXAM: MRI HEAD WITHOUT CONTRAST  MRV HEAD WITHOUT CONTRAST  TECHNIQUE: Multiplanar, multiecho pulse sequences of the brain and surrounding structures were obtained without intravenous contrast. Angiographic images of the intracranial venous structures were obtained using MRV technique without intravenous contrast.  COMPARISON:  MRI and MRV  of the brain May 17, 2014  FINDINGS: MRI HEAD: No reduced diffusion to suggest acute ischemia ; resolution of reduced diffusion in LEFT posterior frontal lobe from prior study. However, abnormal T2 bright signal and susceptibility artifact in the superior sagittal sinus, and the LEFT cortical veins, to lesser extent on the RIGHT, progressed from prior imaging. No intraparenchymal susceptibility artifact. Ventricles and sulci are normal for patient's age. Faint residual FLAIR T2 hyperintense signal about the LEFT central sulcus, improved from prior imaging. No satellite areas of abnormal signal.  No abnormal extra-axial fluid collections. Normal major intracranial vascular flow voids seen at the skull base.  Ocular globes and orbital contents are unremarkable. Paranasal sinuses and mastoid air cells are well aerated. No abnormal sellar expansion. No cerebellar tonsillar ectopia.  MRV HEAD: Further propagation superior sagittal sinus thrombosis, with the anterior and mid segments collecting flow related enhancement. Patent torcula of herophili, transverse sigmoid and  internal jugular veins. Normal flow related enhancement of the internal cerebral veins.  IMPRESSION: MRI HEAD: Further propagation of superior sagittal sinus and cortical vein thrombosis without venous infarction. Small area of residual LEFT frontal gliosis.  MRA HEAD: Further propagation of superior sagittal sinus thrombosis involving proximal and mid segments.  Acute findings discussed with and reconfirmed by Dr.OSVALDO CAMILO on 06/12/2014 at 12:40 am.   Electronically Signed   By: Elon Alas   On: 06/12/2014 00:51    EKG: Independently reviewed. Normal sinus rhythm with nonspecific ST-T changes.  Assessment/Plan Active Problems:   Thrombosis, superior sagittal sinus   Anemia, iron deficiency   Seizure   Cerebral vein thrombosis   1. 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 I have discussed with Dr. Aram Beecham on call neurologist who has advised patient to be started on IV heparin infusion until INR is therapeutic. Patient will be placed on neuro checks at this time. Further recommendations per neurologist. 2. Seizures - patient had a brief episode of seizures witnessed in the ER. Patient is on [place]. Closely observe. 3. Chronic anemia iron deficiency - follow CBC. 4. Recent admit patient for left parietal venous infarction - presently on Coumadin with heparin bridging. 5. History of polysubstance abuse presently on methadone.  Addendum - since patient was lethargic and became more hypotensive I have placed patient on fluid boluses and discussed with Dr. Aram Beecham neurologist. We will transfer patient to stepdown unit and I have also discussed with Dr. Halford Chessman, pulmonary critical care. Recheck labs including metabolic panel CBC and I have ordered stat lactic as levels and ABG. ABG was unremarkable and lactic as was within acceptable limits. Since patient also was having some cough I  have ordered a chest x-ray which shows mild congestion.   DVT Prophylaxis heparin infusion.  Code Status: Full code.  Family Communication: Patient's fianc.  Disposition Plan: Admit to inpatient.    Victoria Alvarez N. [facility] Pager [phone].  If 7PM-7AM, please contact night-coverage [email or web address] Password [email or web address] 06/12/2014, 1:40 AM

## 2014-06-12 NOTE — Discharge Instructions (Signed)
Information on my medicine - ELIQUIS (apixaban)  This medication education was reviewed with me or my healthcare representative as part of my discharge preparation.  The pharmacist that spoke with me during my hospital stay was:  Newt Levingston, Suzan Slick, [facility]  Why was Eliquis prescribed for you? Eliquis was prescribed to treat blood clots that may have been found in the veins of your legs (deep vein thrombosis) or in your lungs (pulmonary embolism) and to reduce the risk of them occurring again.  What do You need to know about Eliquis ? The starting dose is 10 mg (two 5 mg tablets) taken TWICE daily for the FIRST SEVEN (7) DAYS, then on (enter date)  06/19/14  the dose is reduced to ONE 5 mg tablet taken TWICE daily.  Eliquis may be taken with or without food.   Try to take the dose about the same time in the morning and in the evening. If you have difficulty swallowing the tablet whole please discuss with your pharmacist how to take the medication safely.  Take Eliquis exactly as prescribed and DO NOT stop taking Eliquis without talking to the doctor who prescribed the medication.  Stopping may increase your risk of developing a new blood clot.  Refill your prescription before you run out.  After discharge, you should have regular check-up appointments with your healthcare provider that is prescribing your Eliquis.    What do you do if you miss a dose? If a dose of ELIQUIS is not taken at the scheduled time, take it as soon as possible on the same day and twice-daily administration should be resumed. The dose should not be doubled to make up for a missed dose.  Important Safety Information A possible side effect of Eliquis is bleeding. You should call your healthcare provider right away if you experience any of the following: ? Bleeding from an injury or your nose that does not stop. ? Unusual colored urine (red or dark brown) or unusual colored stools (red or black). ? Unusual  bruising for unknown reasons. ? A serious fall or if you hit your head (even if there is no bleeding).  Some medicines may interact with Eliquis and might increase your risk of bleeding or clotting while on Eliquis. To help avoid this, consult your healthcare provider or pharmacist prior to using any new prescription or non-prescription medications, including herbals, vitamins, non-steroidal anti-inflammatory drugs (NSAIDs) and supplements.  This website has more information on Eliquis (apixaban): http://www.eliquis.com/eliquis/home

## 2014-06-12 NOTE — Evaluation (Signed)
Physical Therapy Evaluation Patient Details Name: Victoria Alvarez MRN: [id number] DOB: [birth year] Today's Date: 06/12/2014   History of Present Illness  24 y.o. 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 Patient's symptoms started around 5 PM yesterday. CT head was negative for anything acute. MRI and MRV of the brain was done which did not show any acute infarct but did show propagation of patient's known sagittal sinus thrombosis and cortical vein thrombosis. Patient's INR is subtherapeutic. In the ER patient had a brief episode of seizure. Patient was found to have cramping of her right upper extremity followed by involving the whole body and patient had a brief postictal phase.   Clinical Impression  Patient demonstrates deficits in functional mobility as indicated below. Will need continued skilled PT to address deficits and maximize function. Will see as indicated and progress as tolerated.     Follow Up Recommendations No PT follow up;Supervision/Assistance - 24 hour    Equipment Recommendations  None recommended by PT    Recommendations for Other Services       Precautions / Restrictions Precautions Precautions: Fall Restrictions Weight Bearing Restrictions: No      Mobility  Bed Mobility Overal bed mobility: Modified Independent             General bed mobility comments: increased time to perform secondary to RUE slipping off of bed rail  Transfers Overall transfer level: Independent Equipment used: None             General transfer comment: No physical assist required  Ambulation/Gait Ambulation/Gait assistance:  Supervision Ambulation Distance (Feet): 30 Feet Assistive device: None Gait Pattern/deviations: Step-through pattern;Decreased stride length Gait velocity: decreased Gait velocity interpretation: Below normal speed for age/gender    Stairs            Wheelchair Mobility    Modified Rankin (Stroke Patients Only) Modified Rankin (Stroke Patients Only) Pre-Morbid Rankin Score: Slight disability Modified Rankin: Moderate disability     Balance Overall balance assessment: No apparent balance deficits (not formally assessed)                                           Pertinent Vitals/Pain Pain Assessment: 0-10 Pain Score: 8  Pain Location: head, sore throat Pain Descriptors / Indicators: Headache Pain Intervention(s): Limited activity within patient's tolerance;Repositioned    Home Living Family/patient expects to be discharged to:: Private residence Living Arrangements: Spouse/significant other;Children Available Help at Discharge: Family;Available 24 hours/day;Friend(s) Type of Home: House Home Access: Stairs to enter Entrance Stairs-Rails: None Entrance Stairs-Number of Steps: 2 Home Layout: One level Home Equipment: None Additional Comments: Pt reports that boyfriend, pt's friend, and her sister will be able to provide 24 hour assist, and assist with child care     Prior Function Level of Independence: Independent         Comments: Pt has 63 y/o daughter and new born in NICU. B/f is available to assist - not working full time right now.     Hand Dominance   Dominant Hand: Right    Extremity/Trunk Assessment   Upper Extremity Assessment:  Defer to OT evaluation           Lower Extremity Assessment: RLE deficits/detail RLE Deficits / Details: RLE weakness in isolation 4/5 strength       Communication   Communication:  (dysarthric speech)  Cognition Arousal/Alertness: Awake/alert Behavior During Therapy: Flat affect;Impulsive  (labile) Overall Cognitive Status: No family/caregiver present to determine baseline cognitive functioning                      General Comments General comments (skin integrity, edema, etc.): Patient having significant difficulty with RUE, unable to maneuver onjects on try, difficulty manipulating obejects in hand.     Exercises        Assessment/Plan    PT Assessment Patient needs continued PT services  PT Diagnosis Difficulty walking;Acute pain   PT Problem List Decreased strength;Decreased activity tolerance;Decreased balance;Decreased mobility;Pain  PT Treatment Interventions DME instruction;Gait training;Stair training;Functional mobility training;Therapeutic activities;Therapeutic exercise;Balance training;Patient/family education   PT Goals (Current goals can be found in the Care Plan section) Acute Rehab PT Goals Patient Stated Goal: to be able to take care of herself PT Goal Formulation: With patient Time For Goal Achievement: 06/26/14 Potential to Achieve Goals: Good    Frequency Min 3X/week   Barriers to discharge        Co-evaluation               End of Session   Activity Tolerance: No increased pain (headache)             Time: 1412-1440 PT Time Calculation (min) (ACUTE ONLY): 28 min   Charges:   PT Evaluation $Initial PT Evaluation Tier I: 1 Procedure     PT G CodesFabio Alvarez 06-23-2014, 3:38 PM Victoria Alvarez, PT DPT  [phone]

## 2014-06-12 NOTE — Progress Notes (Signed)
STROKE TEAM PROGRESS NOTE   SUBJECTIVE (INTERVAL HISTORY) Her husband and newborn baby are at the bedside.  Overall she feels her condition is gradually improving. She still complained of right arm weakness and some numbness. Headache much improved. She is on methadone for long-term pain syndrome due to "interstitial cystitis".  OBJECTIVE Temp:  [97.4 F (36.3 C)-98.8 F (37.1 C)] 98.3 F (36.8 C) (04/03 1606) Pulse Rate:  [58-107] 72 (04/03 0800) Cardiac Rhythm:  [-] Normal sinus rhythm (04/03 0800) Resp:  [12-23] 16 (04/03 0800) BP: (75-137)/(31-85) 96/49 mmHg (04/03 0800) SpO2:  [91 %-100 %] 97 % (04/03 0800) Weight:  [175 lb (79.379 kg)-177 lb 7.5 oz (80.5 kg)] 177 lb 7.5 oz (80.5 kg) (04/03 0450)  No results for input(s): GLUCAP in the last 168 hours.  Recent Labs Lab 06/11/14 2122 06/11/14 2135 06/12/14 0305  NA 136 140 142  K 3.3* 3.3* 3.5  CL 103 104 111  CO2 23  --  23  GLUCOSE 85 84 102*  BUN 7 8 9   CREATININE 0.94 0.90 0.74  CALCIUM 9.5  --  8.5    Recent Labs Lab 06/11/14 2122 06/12/14 0305  AST 20 15  ALT 17 14  ALKPHOS 62 50  BILITOT 0.3 0.3  PROT 6.8 5.3*  ALBUMIN 4.2 3.3*    Recent Labs Lab 06/11/14 2122 06/11/14 2135 06/12/14 0305  WBC 6.5  --  5.7  NEUTROABS 3.2  --  2.7  HGB 11.0* 12.2 9.6*  HCT 35.1* 36.0 30.4*  MCV 82.6  --  83.5  PLT 258  --  230    Recent Labs Lab 06/12/14 1018  TROPONINI <0.03    Recent Labs  06/11/14 2122 06/12/14 0305  LABPROT 14.3 16.5*  INR 1.10 1.32    Recent Labs  06/11/14 2223  COLORURINE YELLOW  LABSPEC 1.012  PHURINE 5.5  GLUCOSEU NEGATIVE  HGBUR NEGATIVE  BILIRUBINUR NEGATIVE  KETONESUR NEGATIVE  PROTEINUR NEGATIVE  UROBILINOGEN 0.2  NITRITE NEGATIVE  LEUKOCYTESUR NEGATIVE    No results found for: CHOL, TRIG, HDL, CHOLHDL, VLDL, LDLCALC No results found for: HGBA1C    Component Value Date/Time   LABOPIA NONE DETECTED 06/11/2014 2223   LABOPIA NEG 03/01/2014 1006   COCAINSCRNUR NONE DETECTED 06/11/2014 2223   COCAINSCRNUR NEG 03/01/2014 1006   LABBENZ NONE DETECTED 06/11/2014 2223   LABBENZ NEG 03/01/2014 1006   AMPHETMU NONE DETECTED 06/11/2014 2223   AMPHETMU NEG 03/01/2014 1006   THCU NONE DETECTED 06/11/2014 2223   THCU PPS 03/01/2014 1006   THCU 42* 03/01/2014 1006   LABBARB NONE DETECTED 06/11/2014 2223   LABBARB NEG 03/01/2014 1006     Recent Labs Lab 06/11/14 2141  ETH <5    I have personally reviewed the radiological images below and agree with the radiology interpretations.  Dg Chest 2 View  05/17/2014   IMPRESSION: Normal chest radiographs.      Ct Head Wo Contrast  06/11/2014   IMPRESSION: Negative for acute intracranial hemorrhage or acute infarction. The subacute high left frontal venous infarction is visible, without hemorrhagic conversion and without significant mass effect.    05/17/2014    IMPRESSION: Normal head CT.     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 Small area of residual LEFT frontal gliosis.  MRA HEAD: Further propagation of superior sagittal sinus thrombosis involving proximal and mid segments.  05/17/2014   IMPRESSION: MRI HEAD: Acute small high LEFT frontal cortical venous infarction without hemorrhagic conversion. Corresponding cortical vein thrombosis.  MRA HEAD: Partially thrombosed superior sagittal sinus.      EEG - pending   PHYSICAL EXAM  Temp:  [97.4 F (36.3 C)-98.8 F (37.1 C)] 98.3 F (36.8 C) (04/03 1606) Pulse Rate:  [58-107] 72 (04/03 0800) Resp:  [12-23] 16 (04/03 0800) BP: (75-137)/(31-85) 96/49 mmHg (04/03 0800) SpO2:  [91 %-100 %] 97 % (04/03 0800) Weight:  [175 lb (79.379 kg)-177 lb 7.5 oz (80.5 kg)] 177 lb 7.5 oz (80.5 kg) (04/03 0450)  General - Well nourished, well developed, in no apparent distress, but depressed mood.  Ophthalmologic - fundi  not visualized due to photophobia.  Cardiovascular - Regular rate and rhythm.  Neck - supple, no carotid bruits  Mental Status -  Level of arousal and orientation to time, place, and person were intact. Language including expression, naming, repetition, comprehension was assessed and found intact.  Cranial Nerves II - XII - II - Visual field intact OU. III, IV, VI - Extraocular movements intact. V - Facial sensation decreased on the right. VII - Facial movement intact bilaterally. VIII - Hearing & vestibular intact bilaterally. X - Palate elevates symmetrically. XI - Chin turning & shoulder shrug intact bilaterally. XII - Tongue protrusion intact.  Motor Strength - The patient's strength was 4/5 RUE with pronator drift, 5-/5 all other extremities.  Bulk was normal and fasciculations were absent.   Motor Tone - Muscle tone was assessed at the neck and appendages and was normal.  Reflexes - The patient's reflexes were symmetrical in all extremities and she had no pathological reflexes.  Sensory - Light touch, temperature/pinprick were assessed and were decreased right upper extremity.    Coordination - The patient had normal movements in the hands with no ataxia or dysmetria, but slow.  Tremor was absent.  Gait and Station - not tested due to fatigue.   ASSESSMENT/PLAN Victoria Alvarez is a 24 y.o. 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 Symptoms improving.    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 On Coumadin, however, subtherapeutic INR. Will switch to NOAC.  MRI  no acute infarct, previous left parasagittal venous infarct  MRV completed anterior 2/3 cerebral venous thrombosis of SSS, propagation from previous partial thrombosis of SSS  INR 1.1,  subtherapeutic on Coumadin  Eliquis for VTE prophylaxis  Diet Heart Room service appropriate?: Yes; Fluid consistency:: Thin   warfarin prior to admission, now on eliquis (apixaban)  Patient counseled to be compliant with her antithrombotic medications  Ongoing aggressive stroke risk factor management  Therapy recommendations:  Pending  Disposition:  Pending  Fluctuating INR  Complicating report, patient stated that her Lovenox stopped due to INR on the goal, however she was told to increase Coumadin dose from 5 mg to 7.5 mg and recheck in 1 week.  Since INR difficult to control  Recommend Eliquis for better anticoagulation  Compliance again emphasized  Patient has Medicaid insurance, should be able to cover.  Seizure  Had a witnessed seizure episode overnight  As per her husband, patient both arm flexion and both leg extension , tonic, lasting 30 seconds  Was giving Keppra 1000 load  On Keppra 500 twice a day  EEG pending  Other Active Problems  Postpartum - 4 weeks, no more-no bleeding  Substance abuse - chronic pain syndrome on methadone  Emphasized no breast-feeding  Other Pertinent History  Depressed mood  Patient has already had appointment with Dr. Leonie Man as follow-up on 07/18/2014.  Hospital day # 1   Rosalin Hawking, MD PhD Stroke Neurology 06/12/2014 4:53 PM    To contact Stroke Continuity provider, please refer to [email or web address] After hours, contact General Neurology

## 2014-06-12 NOTE — Progress Notes (Addendum)
ANTICOAGULATION CONSULT NOTE - Initial Consult  Pharmacy Consult for heparin and warfarin Indication: superior sagittal sinus thrombosis  Allergies  Allergen Reactions  . Ciprofloxacin Diarrhea and Nausea And Vomiting    Patient Measurements: Height: 5\' 2"  (157.5 cm) Weight: 175 lb (79.379 kg) IBW/kg (Calculated) : 50.1 Heparin Dosing Weight: 67.5 kg  Vital Signs: Temp: 98.7 F (37.1 C) (04/03 0044) Temp Source: Oral (04/03 0044) BP: 100/43 mmHg (04/03 0044) Pulse Rate: 72 (04/03 0044)  Labs:  Recent Labs  06/11/14 2122 06/11/14 2135  HGB 11.0* 12.2  HCT 35.1* 36.0  PLT 258  --   APTT 32  --   LABPROT 14.3  --   INR 1.10  --   CREATININE 0.94 0.90    Estimated Creatinine Clearance: 94 mL/min (by C-G formula based on Cr of 0.9).   Medical History: Past Medical History  Diagnosis Date  . Interstitial cystitis   . Chronic pelvic pain in female   . Headache(784.0)   . Infection     UTI  . Pyelonephritis affecting pregnancy in first trimester July 2015  . Depression 2007  . Substance abuse   . Pneumonia 03/2014  . Hemiparesis ~ 05/15/2014    RUE/RLE "worse in my arm"    Medications:  Prescriptions prior to admission  Medication Sig Dispense Refill Last Dose  . albuterol (PROVENTIL HFA;VENTOLIN HFA) 108 (90 BASE) MCG/ACT inhaler Inhale 2 puffs into the lungs every 6 (six) hours as needed for wheezing or shortness of breath. (Patient not taking: Reported on 05/20/2014) 1 Inhaler 2 Not Taking  . butalbital-acetaminophen-caffeine (FIORICET) 50-325-40 MG per tablet Take 1 tablet by mouth every 6 (six) hours as needed for headache. (Patient not taking: Reported on 05/17/2014) 20 tablet 0 Not Taking  . enoxaparin (LOVENOX) 120 MG/0.8ML injection Inject 0.8 mLs (120 mg total) into the skin daily. 5 Syringe 0 Taking  . ferrous sulfate 325 (65 FE) MG tablet Take 1 tablet (325 mg total) by mouth 3 (three) times daily with meals. (Patient not taking: Reported on 05/20/2014) 90  tablet 3 Not Taking  . methadone (DOLOPHINE) 10 MG/5ML solution Take 50 mg by mouth daily.     . methadone (DOLOPHINE) 10 MG/ML solution Take 64 mg by mouth daily.    Taking  . ondansetron (ZOFRAN ODT) 8 MG disintegrating tablet Take 1 tablet (8 mg total) by mouth every 8 (eight) hours as needed for nausea or vomiting. 20 tablet 0 Taking  . ondansetron (ZOFRAN) 8 MG tablet Take 8 mg by mouth.     . oxybutynin (DITROPAN) 5 MG tablet Take 1 tablet (5 mg total) by mouth 2 (two) times daily. 60 tablet 3 Past Month at Unknown time  . oxyCODONE-acetaminophen (PERCOCET) 7.5-325 MG per tablet Take 1 tablet by mouth.     . warfarin (COUMADIN) 5 MG tablet Take 7.5 mg daily until return in one week for INR> 30 tablet 0    Scheduled:  . levETIRAcetam  500 mg Intravenous Q12H   Infusions:    Assessment: 23yo female presents with code stroke secondary to cortical vein thrombosis. 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 Pt was on warfarin at home with therapeutic INR 3/18 on warfarin 5mg /day. On presentation, INR is SUBtherapeutic at 1.1, Hgb 12.2, Plt 258, 0.9.  Goal of Therapy:  Heparin level 0.3-0.7 units/ml Monitor platelets by anticoagulation protocol: Yes   Plan:  Give 4000 units bolus x 1 Start heparin infusion at  1000 units/hr Check anti-Xa level in 6 hours and daily while on heparin Continue to monitor H&H and platelets  Warfarin  now x1 Daily INR/CBC Re-educate on warfarin  Arlean Hopping. Newman Pies, PharmD Clinical Pharmacist Pager [phone] 06/12/2014,1:23 AM

## 2014-06-12 NOTE — Evaluation (Signed)
Occupational Therapy Evaluation Patient Details Name: Victoria Alvarez MRN: [id number] DOB: 1990/11/11 Today's Date: 06/12/2014    History of Present Illness 24 y.o. 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 CT head negative for acute findings, MRV of the brain with negative acute infarct but did show propagation of patient's known sagittal sinus thrombosis and cortical vein thrombosis. Patient's INR is subtherapeutic. In the ER patient had a brief episode of seizure. Patient was found to have cramping of her right upper extremity followed by involving the whole body and patient had a brief postictal phase.    Clinical Impression   PTA pt lived at home and was independent with ADLs per her report. Pt with recent CVA in March. Pt presents with significantly impaired functional use of RUE with decreased sensation and strength. Provided pt with built up foam utensils to utilize during meals, as pt was having difficulty. Per PT, pt independent with mobility. Pt will benefit from acute OT to promote strength and functional use of RUE in ADLs. Pt will benefit from OP OT (neuro) upon d/c to address use of RUE.     Follow Up Recommendations  Outpatient OT;Other (comment) (neuro)    Equipment Recommendations  Other (comment) (TBD)    Recommendations for Other Services       Precautions / Restrictions Precautions Precautions: Fall Precaution Comments: seizure precautions Restrictions Weight Bearing Restrictions: No      Mobility Bed Mobility Overal bed mobility: Modified Independent             General bed mobility comments: increased  time to perform secondary to RUE slipping off of bed rail  Transfers Overall transfer level: Independent Equipment used: None             General transfer comment: Per PT, pt independent with transfers.     Balance Overall balance assessment: No apparent balance deficits (not formally assessed)                                          ADL Overall ADL's : Needs assistance/impaired Eating/Feeding: Sitting;Set up;With adaptive utensils Eating/Feeding Details (indicate cue type and reason): built up handles provided to pt's utensils to promote independence with self-feeding. Pt able to hold utensil and scoop food and bring to mouth and reported satisfaction with increased independence.  Grooming: Sitting;Minimal assistance Grooming Details (indicate cue type and reason): pt having difficulty holding self-care items with right hand.  Upper Body Bathing: Minimal assitance;Sitting       Upper Body Dressing : Moderate assistance;Sitting                     General ADL Comments: Pt assessed at bedside following PT eval  in order to assist with difficulties faced during mealtime. pt c/o difficulty holding utensils for self-feeding. Provided pt with utensils with built up handles using washcloths and surgical tape and pt demonstrated improved ability to perform self-feeding. Per PT, pt independent with functional mobility. Pt did demonstrate improved strength during functional activities and question ideomotor apraxia.      Vision Additional Comments: Vision  to be assessed           Pertinent Vitals/Pain Pain Assessment: 0-10 Pain Score: 8  Pain Location: head, sore throat Pain Descriptors / Indicators: Headache Pain Intervention(s): Limited activity within patient's tolerance;Monitored during session;Repositioned     Hand Dominance Right   Extremity/Trunk Assessment Upper Extremity Assessment Upper Extremity Assessment: RUE deficits/detail;Generalized  weakness RUE Deficits / Details: RUE with slow coordination, 3-/5 grip strength, 3-/5 bicep, 2+/5 shoulder flexion. Pt having difficulty eating meal when OT arrived. Pt is right hand dominant. Mild edema in right hand.  RUE Sensation: decreased light touch RUE Coordination: decreased fine motor;decreased gross motor   Lower Extremity Assessment Lower Extremity Assessment: Defer to PT evaluation RLE Deficits / Details: RLE weakness in isolation 4/5 strength   Cervical / Trunk Assessment Cervical / Trunk Assessment: Normal   Communication Communication Communication: Expressive difficulties (dysarthric speech)   Cognition Arousal/Alertness: Awake/alert Behavior During Therapy: Flat affect;Impulsive (labile) Overall Cognitive Status: No family/caregiver present to determine baseline cognitive functioning                                Home Living Family/patient expects to be discharged to:: Private residence Living Arrangements: Spouse/significant other;Children Available Help at Discharge: Family;Available 24 hours/day;Friend(s) Type of Home: House Home Access: Stairs to enter [employer] of Steps: 2 Entrance Stairs-Rails: None Home Layout: One level     Bathroom Shower/Tub: Tub/shower unit Shower/tub characteristics: Engineer, building services: Standard     Home Equipment: None   Additional Comments: Pt reports that boyfriend, pt's friend, and her sister will be able to provide 24 hour assist, and assist with child care       Prior Functioning/Environment Level of Independence: Independent        Comments: Pt has 59 y/o daughter and new born in NICU. B/f is available to assist - not working full time right now.    OT Diagnosis: Generalized weakness;Acute pain;Cognitive deficits;Hemiplegia dominant side;Apraxia   OT Problem List: Decreased strength;Decreased range of motion;Decreased activity tolerance;Decreased coordination;Decreased  cognition;Impaired sensation;Impaired UE functional use;Increased edema   OT Treatment/Interventions: Self-care/ADL training;Therapeutic exercise;Energy conservation;DME and/or AE instruction;Therapeutic activities;Cognitive remediation/compensation;Patient/family education;Neuromuscular education    OT Goals(Current goals can be found in the care plan section) Acute Rehab OT Goals Patient Stated Goal: to be able to take care of herself OT Goal Formulation: With patient Time For Goal Achievement: 06/26/14 Potential to Achieve Goals: Good ADL Goals Pt Will Perform Eating: with modified independence;sitting Pt Will Perform Grooming: with modified independence Pt Will Perform Upper Body Bathing: with modified independence Pt Will Perform Upper Body Dressing: with modified independence Pt/caregiver will Perform Home Exercise Program: Increased ROM;Increased strength;Right Upper extremity;Independently  OT Frequency: Min 3X/week    End of Session Equipment Utilized During Treatment: Other (comment) (built up foam handles)  Activity Tolerance: Patient tolerated treatment well Patient left: in bed;with call bell/phone within reach;with bed alarm set   Time: 1430-1440 OT Time Calculation (min): 10 min Charges:  OT General Charges $OT Visit: 1 Procedure OT Evaluation $Initial OT Evaluation Tier I: 1 Procedure G-Codes:    Rae Lips 23-Jun-2014, 5:41 PM  Carney Living, OTR/L Occupational Therapist [phone] (pager)

## 2014-06-13 ENCOUNTER — Inpatient Hospital Stay (HOSPITAL_COMMUNITY): Payer: Medicaid Other

## 2014-06-13 DIAGNOSIS — R29898 Other symptoms and signs involving the musculoskeletal system: Secondary | ICD-10-CM | POA: Insufficient documentation

## 2014-06-13 LAB — CBC
HCT: 27.7 % — ABNORMAL LOW (ref 36.0–46.0)
Hemoglobin: 8.5 g/dL — ABNORMAL LOW (ref 12.0–15.0)
MCH: 25.7 pg — ABNORMAL LOW (ref 26.0–34.0)
MCHC: 30.7 g/dL (ref 30.0–36.0)
MCV: 83.7 fL (ref 78.0–100.0)
Platelets: 196 10*3/uL (ref 150–400)
RBC: 3.31 MIL/uL — ABNORMAL LOW (ref 3.87–5.11)
RDW: 17 % — ABNORMAL HIGH (ref 11.5–15.5)
WBC: 4.9 10*3/uL (ref 4.0–10.5)

## 2014-06-13 MED ORDER — LEVETIRACETAM 500 MG PO TABS: 500.0000 mg | ORAL_TABLET | Freq: Two times a day (BID) | ORAL | Status: DC

## 2014-06-13 MED ORDER — APIXABAN 5 MG PO TABS: 10.0000 mg | ORAL_TABLET | Freq: Two times a day (BID) | ORAL | Status: DC

## 2014-06-13 MED ORDER — APIXABAN 5 MG PO TABS: 5.0000 mg | ORAL_TABLET | Freq: Two times a day (BID) | ORAL | Status: DC

## 2014-06-13 MED ORDER — SODIUM CHLORIDE 0.9 % IV SOLN
INTRAVENOUS | Status: DC
  Administered 2014-06-13: 04:00:00 via INTRAVENOUS

## 2014-06-13 NOTE — Progress Notes (Addendum)
STROKE TEAM PROGRESS NOTE   SUBJECTIVE (INTERVAL HISTORY) Her husband and newborn baby are at the bedside.  Overall she feels her condition is gradually improving. She still complained of right arm weakness and some numbness. Headache much improved. She is on methadone for long-term pain syndrome due to "interstitial cystitis".She wants to go home  OBJECTIVE Temp:  [97.4 F (36.3 C)-98.3 F (36.8 C)] 98 F (36.7 C) (04/04 0359) Pulse Rate:  [57-73] 57 (04/04 0359) Cardiac Rhythm:  [-] Sinus bradycardia (04/04 0359) Resp:  [16-23] 20 (04/04 0359) BP: (94-108)/(32-61) 100/52 mmHg (04/04 0359) SpO2:  [94 %-100 %] 100 % (04/04 0359)  No results for input(s): GLUCAP in the last 168 hours.  Recent Labs Lab 06/11/14 2122 06/11/14 2135 06/12/14 0305  NA 136 140 142  K 3.3* 3.3* 3.5  CL 103 104 111  CO2 23  --  23  GLUCOSE 85 84 102*  BUN 7 8 9   CREATININE 0.94 0.90 0.74  CALCIUM 9.5  --  8.5    Recent Labs Lab 06/11/14 2122 06/12/14 0305  AST 20 15  ALT 17 14  ALKPHOS 62 50  BILITOT 0.3 0.3  PROT 6.8 5.3*  ALBUMIN 4.2 3.3*    Recent Labs Lab 06/11/14 2122 06/11/14 2135 06/12/14 0305 06/13/14 0244  WBC 6.5  --  5.7 4.9  NEUTROABS 3.2  --  2.7  --   HGB 11.0* 12.2 9.6* 8.5*  HCT 35.1* 36.0 30.4* 27.7*  MCV 82.6  --  83.5 83.7  PLT 258  --  230 196    Recent Labs Lab 06/12/14 1018  TROPONINI <0.03    Recent Labs  06/11/14 2122 06/12/14 0305  LABPROT 14.3 16.5*  INR 1.10 1.32    Recent Labs  06/11/14 2223  COLORURINE YELLOW  LABSPEC 1.012  PHURINE 5.5  GLUCOSEU NEGATIVE  HGBUR NEGATIVE  BILIRUBINUR NEGATIVE  KETONESUR NEGATIVE  PROTEINUR NEGATIVE  UROBILINOGEN 0.2  NITRITE NEGATIVE  LEUKOCYTESUR NEGATIVE    No results found for: CHOL, TRIG, HDL, CHOLHDL, VLDL, LDLCALC No results found for: HGBA1C    Component Value Date/Time   LABOPIA NONE DETECTED 06/11/2014 2223   LABOPIA NEG 03/01/2014 1006   COCAINSCRNUR NONE DETECTED 06/11/2014  2223   COCAINSCRNUR NEG 03/01/2014 1006   LABBENZ NONE DETECTED 06/11/2014 2223   LABBENZ NEG 03/01/2014 1006   AMPHETMU NONE DETECTED 06/11/2014 2223   AMPHETMU NEG 03/01/2014 1006   THCU NONE DETECTED 06/11/2014 2223   THCU PPS 03/01/2014 1006   THCU 42* 03/01/2014 1006   LABBARB NONE DETECTED 06/11/2014 2223   LABBARB NEG 03/01/2014 1006     Recent Labs Lab 06/11/14 2141  ETH <5    I have personally reviewed the radiological images below and agree with the radiology interpretations.  Dg Chest 2 View  05/17/2014   IMPRESSION: Normal chest radiographs.      Ct Head Wo Contrast  06/11/2014   IMPRESSION: Negative for acute intracranial hemorrhage or acute infarction. The subacute high left frontal venous infarction is visible, without hemorrhagic conversion and without significant mass effect.    05/17/2014    IMPRESSION: Normal head CT.     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 Small area of residual LEFT frontal gliosis.  MRA HEAD: Further propagation of superior sagittal sinus thrombosis involving proximal and mid segments.   05/17/2014   IMPRESSION: MRI  HEAD: Acute small high LEFT frontal cortical venous infarction without hemorrhagic conversion. Corresponding cortical vein thrombosis.  MRA HEAD: Partially thrombosed superior sagittal sinus.      EEG - normal PHYSICAL EXAM  Temp:  [97.4 F (36.3 C)-98.3 F (36.8 C)] 98 F (36.7 C) (04/04 0359) Pulse Rate:  [57-73] 57 (04/04 0359) Resp:  [16-23] 20 (04/04 0359) BP: (94-108)/(32-61) 100/52 mmHg (04/04 0359) SpO2:  [94 %-100 %] 100 % (04/04 0359)  General - Well nourished, well developed, in no apparent distress, but depressed mood.  Ophthalmologic - fundi not visualized due to photophobia.  Cardiovascular - Regular rate and rhythm.  Neck - supple, no carotid bruits  Mental Status -   Level of arousal and orientation to time, place, and person were intact. Language including expression, naming, repetition, comprehension was assessed and found intact.  Cranial Nerves II - XII - II - Visual field intact OU. III, IV, VI - Extraocular movements intact. V - Facial sensation decreased on the right. VII - Facial movement intact bilaterally. VIII - Hearing & vestibular intact bilaterally. X - Palate elevates symmetrically. XI - Chin turning & shoulder shrug intact bilaterally. XII - Tongue protrusion intact.  Motor Strength - The patient's strength was 4/5 RUE with pronator drift, 5-/5 all other extremities.  Bulk was normal and fasciculations were absent.   Motor Tone - Muscle tone was assessed at the neck and appendages and was normal.  Reflexes - The patient's reflexes were symmetrical in all extremities and she had no pathological reflexes.  Sensory - Light touch, temperature/pinprick were assessed and were decreased right hand medial aspect only.    Coordination - The patient had normal movements in the hands with no ataxia or dysmetria, but slow.  Tremor was absent.  Gait and Station - not tested due to fatigue.   ASSESSMENT/PLAN [name] is a 24 y.o. 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 Symptoms improving.    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 On Coumadin, however, subtherapeutic INR. Will switch to NOAC.  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, propagation from previous partial thrombosis of SSS  INR 1.1, subtherapeutic on Coumadin  Eliquis for VTE prophylaxis Diet Heart Room service appropriate?: Yes; Fluid consistency::  Thin  Diet - low sodium heart healthy   warfarin prior to admission, now on eliquis (apixaban)  Patient counseled to be compliant with her antithrombotic medications  Ongoing aggressive stroke risk factor management  Therapy recommendations:  Pending  Disposition:  Pending  Fluctuating INR  Complicating report, patient stated that her Lovenox stopped due to INR on the goal, however she was told to increase Coumadin dose from 5 mg to 7.5 mg and recheck in 1 week.  Since INR difficult to control  Recommend Eliquis for better anticoagulation  Compliance again emphasized  Patient has Medicaid insurance, should be able to cover.  Seizure  Had a witnessed seizure episode overnight  As per her husband, patient both arm flexion and both leg extension , tonic, lasting 30 seconds  Was giving Keppra 1000 load  On Keppra 500 twice a day  EEG pending  Other Active Problems  Postpartum - 4 weeks, no bleeding  Substance abuse - chronic pain  syndrome on methadone  Emphasized no breast-feeding  Other Pertinent History  Depressed mood  Patient has already had appointment with Dr. Leonie Man as follow-up on 07/18/2014.  Hospital day # 2 . She remains at recurrent risk for increase thrombosis, strokes, seizures and neurological worsening .Continue eliquis for anticoagulation for at least 6 months. Continue Keppra 500 mg twice daily for seizures. Keep scheduled follow-up appointment with me. Discussed with patient and husband and answered questions. Stroke team will sign off. Kindly call for questions.  Antony Contras, MD Stroke Neurology 06/13/2014 11:47 AM    To contact Stroke Continuity provider, please refer to [email or web address] After hours, contact General Neurology

## 2014-06-13 NOTE — Progress Notes (Signed)
UR COMPLETED  

## 2014-06-13 NOTE — Progress Notes (Signed)
Pt to d/c home: reviewed instructions; f/up appts, Rx, etc. W/Pt and sig other. Case Mgt to arrange [facility] per order.

## 2014-06-13 NOTE — Discharge Summary (Signed)
Physician Discharge Summary  Victoria Alvarez MRN: [id number] DOB/AGE: 02-Aug-1990 24 y.o.  PCP: LAND, PHILLIP, PA-C   Admit date: 06/11/2014 Discharge date: 06/13/2014  Discharge Diagnoses:     Active Problems:   Thrombosis, superior sagittal sinus with propagation   Anemia, iron deficiency   Seizure   Cerebral vein thrombosis   Cerebral venous infarction, acute   Substance abuse   Follow-up recommendations  Dr. Leonie Man as follow-up on 07/18/2014.  Follow-up with PCP in 5-7 days    Medication List    STOP taking these medications        warfarin 5 MG tablet  Commonly known as:  COUMADIN      TAKE these medications        apixaban 5 MG Tabs tablet  Commonly known as:  ELIQUIS  Take 2 tablets (10 mg total) by mouth 2 (two) times daily.     apixaban 5 MG Tabs tablet  Commonly known as:  ELIQUIS  Take 1 tablet (5 mg total) by mouth 2 (two) times daily.  Start taking on:  06/19/2014     levETIRAcetam 500 MG tablet  Commonly known as:  KEPPRA  Take 1 tablet (500 mg total) by mouth 2 (two) times daily.     methadone 10 MG/5ML solution  Commonly known as:  DOLOPHINE  Take 72 mg by mouth daily.     ondansetron 8 MG disintegrating tablet  Commonly known as:  ZOFRAN ODT  Take 1 tablet (8 mg total) by mouth every 8 (eight) hours as needed for nausea or vomiting.        Discharge Condition: Stable Disposition: 06-Home-Health Care Svc   Consults: Urology    Significant Diagnostic Studies: Dg Chest 2 View  05/17/2014   CLINICAL DATA:  Wheezing  EXAM: CHEST  2 VIEW  COMPARISON:  03/06/2014  FINDINGS: Lungs are clear.  No pleural effusion or pneumothorax.  The heart is normal in size.  Visualized osseous structures are within normal limits.  IMPRESSION: Normal chest radiographs.   Electronically Signed   By: Julian Hy M.D.   On: 05/17/2014 18:22   Ct Head Wo Contrast  06/11/2014   CLINICAL DATA:  Right arm numbness, headache and lightheadedness. Venous  infarction 3 weeks ago in the high left frontal region.  EXAM: CT HEAD WITHOUT CONTRAST  TECHNIQUE: Contiguous axial images were obtained from the base of the skull through the vertex without intravenous contrast.  COMPARISON:  MRI 05/17/2014  FINDINGS: The high left frontal venous infarction is visible as an ill-defined area of low attenuation on this scan. There is no hemorrhagic conversion of the infarction. Remainder of the brain is also negative for intracranial hemorrhage, mass or acute infarction. Ventricles and basal cisterns are normal in size and configuration. Gray matter and white matter are normal except for the subacute high left frontal convexity venous infarction. No significant bony abnormalities are evident.  IMPRESSION: Negative for acute intracranial hemorrhage or acute infarction. The subacute high left frontal venous infarction is visible, without hemorrhagic conversion and without significant mass effect. Critical Value/emergent results were called by telephone at the time of interpretation on 06/11/2014 at 9:35 pm to Dr. Elnora Morrison , who verbally acknowledged these results.   Electronically Signed   By: Andreas Newport M.D.   On: 06/11/2014 21:37   Ct Head Wo Contrast  05/17/2014   CLINICAL DATA:  Numbness  EXAM: CT HEAD WITHOUT CONTRAST  TECHNIQUE: Contiguous axial images were obtained from the base of  the skull through the vertex without intravenous contrast.  COMPARISON:  06/01/2008  FINDINGS: No evidence of parenchymal hemorrhage or extra-axial fluid collection. No mass lesion, mass effect, or midline shift.  No CT evidence of acute infarction.  Cerebral volume is within normal limits.  No ventriculomegaly.  The visualized paranasal sinuses are essentially clear. The mastoid air cells are unopacified.  No evidence of calvarial fracture.  IMPRESSION: Normal head CT.   Electronically Signed   By: Julian Hy M.D.   On: 05/17/2014 18:11   Mr Brain Wo Contrast  06/12/2014    CLINICAL DATA:  Hypercoagulable patient, postpartum status post LEFT parietal subacute cortical infarction due to cortical vein thrombosis. On Coumadin, developed a severe headache this morning, RIGHT hand numbness and heaviness around 5 p.m. Symptoms similar to prior stroke.  EXAM: MRI HEAD WITHOUT CONTRAST  MRV HEAD WITHOUT CONTRAST  TECHNIQUE: Multiplanar, multiecho pulse sequences of the brain and surrounding structures were obtained without intravenous contrast. Angiographic images of the intracranial venous structures were obtained using MRV technique without intravenous contrast.  COMPARISON:  MRI and MRV of the brain May 17, 2014  FINDINGS: MRI HEAD: No reduced diffusion to suggest acute ischemia ; resolution of reduced diffusion in LEFT posterior frontal lobe from prior study. However, abnormal T2 bright signal and susceptibility artifact in the superior sagittal sinus, and the LEFT cortical veins, to lesser extent on the RIGHT, progressed from prior imaging. No intraparenchymal susceptibility artifact. Ventricles and sulci are normal for patient's age. Faint residual FLAIR T2 hyperintense signal about the LEFT central sulcus, improved from prior imaging. No satellite areas of abnormal signal.  No abnormal extra-axial fluid collections. Normal major intracranial vascular flow voids seen at the skull base.  Ocular globes and orbital contents are unremarkable. Paranasal sinuses and mastoid air cells are well aerated. No abnormal sellar expansion. No cerebellar tonsillar ectopia.  MRV HEAD: Further propagation superior sagittal sinus thrombosis, with the anterior and mid segments collecting flow related enhancement. Patent torcula of herophili, transverse sigmoid and internal jugular veins. Normal flow related enhancement of the internal cerebral veins.  IMPRESSION: MRI HEAD: Further propagation of superior sagittal sinus and cortical vein thrombosis without venous infarction. Small area of residual LEFT  frontal gliosis.  MRA HEAD: Further propagation of superior sagittal sinus thrombosis involving proximal and mid segments.  Acute findings discussed with and reconfirmed by Dr.OSVALDO CAMILO on 06/12/2014 at 12:40 am.   Electronically Signed   By: Elon Alas   On: 06/12/2014 00:51   Mr Brain W Wo Contrast  05/17/2014   CLINICAL DATA:  RIGHT-sided paresthesia for 5 minutes, beginning 2 days ago with residual weakness, 1 day of Headache, RIGHT arm weakness. Postpartum patient, history of infection and substance abuse.  EXAM: MRI HEAD WITH CONTRAST  MRV HEAD WITHOUT CONTRAST  TECHNIQUE: Multiplanar, multiecho pulse sequences of the brain and surrounding structures were obtained without and with intravenous contrast. Angiographic images of the head were obtained using MRV technique without contrast. MIP images provided.  CONTRAST:  69m MULTIHANCE GADOBENATE DIMEGLUMINE 529 MG/ML IV SOLN  COMPARISON:  CT of the head May 17, 2014 at 1802 hours  FINDINGS: MRI HEAD FINDINGS  Faint area of approximately 2.6 cm reduced diffusion in LEFT posterior frontal cortex at the convexity, with poor corresponding low ADC values. Mildly expansile T2 hyperintense signal resulting in mild sulcal effacement without midline shift. Curvilinear susceptibility artifact within high LEFT frontal extra-axial space, axial 23 and 24/24 highly concerning for cortical vein thrombosis. Equivocal  loss of the superior sagittal sinus flow void focally on sagittal T1 12/23. No abnormal parenchymal enhancement. Heterogeneous enhancement of the superior sagittal sinus.  Ventricles and sulci are otherwise normal for patient's age. No midline shift. No satellite areas of abnormal signal are reduced diffusion. No abnormal extra-axial fluid collections. Normal major intracranial vascular flow voids seen at the skull base.  Ocular globes and orbital contents are unremarkable. Paranasal sinuses mastoid air cells well-aerated. No abnormal sellar expansion.  No cerebellar tonsillar ectopia. No suspicious calvarial bone marrow signal.  MRV HEAD FINDINGS  Irregular poor flow related enhancement of the mid superior sagittal sinus, the anterior and more proximal segments are widely patent. RIGHT transverse sinus is dominant but both are patent with normal appearance of the sagittal sinus and included internal cerebral veins.  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 Corresponding cortical vein thrombosis.  MRA HEAD: Partially thrombosed superior sagittal sinus.   Electronically Signed   By: Elon Alas   On: 05/17/2014 23:03   Mr Venogram Head  06/12/2014   CLINICAL DATA:  Hypercoagulable patient, postpartum status post LEFT parietal subacute cortical infarction due to cortical vein thrombosis. On Coumadin, developed a severe headache this morning, RIGHT hand numbness and heaviness around 5 p.m. Symptoms similar to prior stroke.  EXAM: MRI HEAD WITHOUT CONTRAST  MRV HEAD WITHOUT CONTRAST  TECHNIQUE: Multiplanar, multiecho pulse sequences of the brain and surrounding structures were obtained without intravenous contrast. Angiographic images of the intracranial venous structures were obtained using MRV technique without intravenous contrast.  COMPARISON:  MRI and MRV of the brain May 17, 2014  FINDINGS: MRI HEAD: No reduced diffusion to suggest acute ischemia ; resolution of reduced diffusion in LEFT posterior frontal lobe from prior study. However, abnormal T2 bright signal and susceptibility artifact in the superior sagittal sinus, and the LEFT cortical veins, to lesser extent on the RIGHT, progressed from prior imaging. No intraparenchymal susceptibility artifact. Ventricles and sulci are normal for patient's age. Faint residual FLAIR T2 hyperintense signal about the LEFT central sulcus, improved from prior imaging. No satellite areas of abnormal signal.  No abnormal extra-axial fluid  collections. Normal major intracranial vascular flow voids seen at the skull base.  Ocular globes and orbital contents are unremarkable. Paranasal sinuses and mastoid air cells are well aerated. No abnormal sellar expansion. No cerebellar tonsillar ectopia.  MRV HEAD: Further propagation superior sagittal sinus thrombosis, with the anterior and mid segments collecting flow related enhancement. Patent torcula of herophili, transverse sigmoid and internal jugular veins. Normal flow related enhancement of the internal cerebral veins.  IMPRESSION: MRI HEAD: Further propagation of superior sagittal sinus and cortical vein thrombosis without venous infarction. Small area of residual LEFT frontal gliosis.  MRA HEAD: Further propagation of superior sagittal sinus thrombosis involving proximal and mid segments.  Acute findings discussed with and reconfirmed by Dr.OSVALDO CAMILO on 06/12/2014 at 12:40 am.   Electronically Signed   By: Elon Alas   On: 06/12/2014 00:51   Mr Venogram Head  05/17/2014   CLINICAL DATA:  RIGHT-sided paresthesia for 5 minutes, beginning 2 days ago with residual weakness, 1 day of Headache, RIGHT arm weakness. Postpartum patient, history of infection and substance abuse.  EXAM: MRI HEAD WITH CONTRAST  MRV HEAD WITHOUT CONTRAST  TECHNIQUE: Multiplanar, multiecho pulse sequences of the brain and surrounding structures were obtained without and with intravenous contrast. Angiographic images of the head were obtained using MRV technique without contrast. MIP images  provided.  CONTRAST:  63m MULTIHANCE GADOBENATE DIMEGLUMINE 529 MG/ML IV SOLN  COMPARISON:  CT of the head May 17, 2014 at 1802 hours  FINDINGS: MRI HEAD FINDINGS  Faint area of approximately 2.6 cm reduced diffusion in LEFT posterior frontal cortex at the convexity, with poor corresponding low ADC values. Mildly expansile T2 hyperintense signal resulting in mild sulcal effacement without midline shift. Curvilinear susceptibility  artifact within high LEFT frontal extra-axial space, axial 23 and 24/24 highly concerning for cortical vein thrombosis. Equivocal loss of the superior sagittal sinus flow void focally on sagittal T1 12/23. No abnormal parenchymal enhancement. Heterogeneous enhancement of the superior sagittal sinus.  Ventricles and sulci are otherwise normal for patient's age. No midline shift. No satellite areas of abnormal signal are reduced diffusion. No abnormal extra-axial fluid collections. Normal major intracranial vascular flow voids seen at the skull base.  Ocular globes and orbital contents are unremarkable. Paranasal sinuses mastoid air cells well-aerated. No abnormal sellar expansion. No cerebellar tonsillar ectopia. No suspicious calvarial bone marrow signal.  MRV HEAD FINDINGS  Irregular poor flow related enhancement of the mid superior sagittal sinus, the anterior and more proximal segments are widely patent. RIGHT transverse sinus is dominant but both are patent with normal appearance of the sagittal sinus and included internal cerebral veins.  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 Corresponding cortical vein thrombosis.  MRA HEAD: Partially thrombosed superior sagittal sinus.   Electronically Signed   By: CElon Alas  On: 05/17/2014 23:03   Dg Chest Port 1 View  06/12/2014   CLINICAL DATA:  Acute onset of cough.  Headache.  Initial encounter.  EXAM: PORTABLE CHEST - 1 VIEW  COMPARISON:  Chest radiograph performed 05/17/2014  FINDINGS: The lungs are well-aerated. Mild vascular congestion is noted. There is no evidence of focal opacification, pleural effusion or pneumothorax.  The cardiomediastinal silhouette is borderline enlarged. No acute osseous abnormalities are seen.  IMPRESSION: Mild vascular congestion and borderline cardiomegaly. No acute cardiopulmonary process seen.   Electronically Signed   By: JGarald BaldingM.D.   On:  06/12/2014 04:32      Microbiology: Recent Results (from the past 240 hour(s))  MRSA PCR Screening     Status: None   Collection Time: 06/12/14  4:45 AM  Result Value Ref Range Status   MRSA by PCR NEGATIVE NEGATIVE Final    Comment:        The GeneXpert MRSA Assay (FDA approved for NASAL specimens only), is one component of a comprehensive MRSA colonization surveillance program. It is not intended to diagnose MRSA infection nor to guide or monitor treatment for MRSA infections.      Labs: Results for orders placed or performed during the hospital encounter of 06/11/14 (from the past 48 hour(s))  Protime-INR     Status: None   Collection Time: 06/11/14  9:22 PM  Result Value Ref Range   Prothrombin Time 14.3 11.6 - 15.2 seconds   INR 1.10 0.00 - 1.49  APTT     Status: None   Collection Time: 06/11/14  9:22 PM  Result Value Ref Range   aPTT 32 24 - 37 seconds  CBC     Status: Abnormal   Collection Time: 06/11/14  9:22 PM  Result Value Ref Range   WBC 6.5 4.0 - 10.5 K/uL   RBC 4.25 3.87 - 5.11 MIL/uL   Hemoglobin 11.0 (L) 12.0 - 15.0 g/dL   HCT 35.1 (L)  36.0 - 46.0 %   MCV 82.6 78.0 - 100.0 fL   MCH 25.9 (L) 26.0 - 34.0 pg   MCHC 31.3 30.0 - 36.0 g/dL   RDW 16.8 (H) 11.5 - 15.5 %   Platelets 258 150 - 400 K/uL  Differential     Status: None   Collection Time: 06/11/14  9:22 PM  Result Value Ref Range   Neutrophils Relative % 50 43 - 77 %   Neutro Abs 3.2 1.7 - 7.7 K/uL   Lymphocytes Relative 43 12 - 46 %   Lymphs Abs 2.7 0.7 - 4.0 K/uL   Monocytes Relative 5 3 - 12 %   Monocytes Absolute 0.3 0.1 - 1.0 K/uL   Eosinophils Relative 2 0 - 5 %   Eosinophils Absolute 0.1 0.0 - 0.7 K/uL   Basophils Relative 0 0 - 1 %   Basophils Absolute 0.0 0.0 - 0.1 K/uL  Comprehensive metabolic panel     Status: Abnormal   Collection Time: 06/11/14  9:22 PM  Result Value Ref Range   Sodium 136 135 - 145 mmol/L   Potassium 3.3 (L) 3.5 - 5.1 mmol/L   Chloride 103 96 - 112  mmol/L   CO2 23 19 - 32 mmol/L   Glucose, Bld 85 70 - 99 mg/dL   BUN 7 6 - 23 mg/dL   Creatinine, Ser 0.94 0.50 - 1.10 mg/dL   Calcium 9.5 8.4 - 10.5 mg/dL   Total Protein 6.8 6.0 - 8.3 g/dL   Albumin 4.2 3.5 - 5.2 g/dL   AST 20 0 - 37 U/L   ALT 17 0 - 35 U/L   Alkaline Phosphatase 62 39 - 117 U/L   Total Bilirubin 0.3 0.3 - 1.2 mg/dL   GFR calc non Af Amer 84 (L) >90 mL/min   GFR calc Af Amer >90 >90 mL/min    Comment: (NOTE) The eGFR has been calculated using the CKD EPI equation. This calculation has not been validated in all clinical situations. eGFR's persistently <90 mL/min signify possible Chronic Kidney Disease.    Anion gap 10 5 - 15  I-Stat Troponin, ED (not at [facility])     Status: None   Collection Time: 06/11/14  9:34 PM  Result Value Ref Range   Troponin i, poc 0.00 0.00 - 0.08 ng/mL   Comment 3            Comment: Due to the release kinetics of cTnI, a negative result within the first hours of the onset of symptoms does not rule out myocardial infarction with certainty. If myocardial infarction is still suspected, repeat the test at appropriate intervals.   I-Stat Chem 8, ED     Status: Abnormal   Collection Time: 06/11/14  9:35 PM  Result Value Ref Range   Sodium 140 135 - 145 mmol/L   Potassium 3.3 (L) 3.5 - 5.1 mmol/L   Chloride 104 96 - 112 mmol/L   BUN 8 6 - 23 mg/dL   Creatinine, Ser 0.90 0.50 - 1.10 mg/dL   Glucose, Bld 84 70 - 99 mg/dL   Calcium, Ion 1.17 1.12 - 1.23 mmol/L   TCO2 22 0 - 100 mmol/L   Hemoglobin 12.2 12.0 - 15.0 g/dL   HCT 36.0 36.0 - 46.0 %  Ethanol     Status: None   Collection Time: 06/11/14  9:41 PM  Result Value Ref Range   Alcohol, Ethyl (B) <5 0 - 9 mg/dL    Comment:  LOWEST DETECTABLE LIMIT FOR SERUM ALCOHOL IS 11 mg/dL FOR MEDICAL PURPOSES ONLY   Urine Drug Screen     Status: None   Collection Time: 06/11/14 10:23 PM  Result Value Ref Range   Opiates NONE DETECTED NONE DETECTED   Cocaine NONE DETECTED NONE  DETECTED   Benzodiazepines NONE DETECTED NONE DETECTED   Amphetamines NONE DETECTED NONE DETECTED   Tetrahydrocannabinol NONE DETECTED NONE DETECTED   Barbiturates NONE DETECTED NONE DETECTED    Comment:        DRUG SCREEN FOR MEDICAL PURPOSES ONLY.  IF CONFIRMATION IS NEEDED FOR ANY PURPOSE, NOTIFY LAB WITHIN 5 DAYS.        LOWEST DETECTABLE LIMITS FOR URINE DRUG SCREEN Drug Class       Cutoff (ng/mL) Amphetamine      1000 Barbiturate      200 Benzodiazepine   706 Tricyclics       237 Opiates          300 Cocaine          300 THC              50   Urinalysis, Routine w reflex microscopic     Status: Abnormal   Collection Time: 06/11/14 10:23 PM  Result Value Ref Range   Color, Urine YELLOW YELLOW   APPearance CLOUDY (A) CLEAR   Specific Gravity, Urine 1.012 1.005 - 1.030   pH 5.5 5.0 - 8.0   Glucose, UA NEGATIVE NEGATIVE mg/dL   Hgb urine dipstick NEGATIVE NEGATIVE   Bilirubin Urine NEGATIVE NEGATIVE   Ketones, ur NEGATIVE NEGATIVE mg/dL   Protein, ur NEGATIVE NEGATIVE mg/dL   Urobilinogen, UA 0.2 0.0 - 1.0 mg/dL   Nitrite NEGATIVE NEGATIVE   Leukocytes, UA NEGATIVE NEGATIVE    Comment: MICROSCOPIC NOT DONE ON URINES WITH NEGATIVE PROTEIN, BLOOD, LEUKOCYTES, NITRITE, OR GLUCOSE <1000 mg/dL.  Blood gas, arterial     Status: Abnormal   Collection Time: 06/12/14  2:50 AM  Result Value Ref Range   FIO2 0.21 %   pH, Arterial 7.368 7.350 - 7.450   pCO2 arterial 40.6 35.0 - 45.0 mmHg   pO2, Arterial 77.5 (L) 80.0 - 100.0 mmHg   Bicarbonate 22.8 20.0 - 24.0 mEq/L   TCO2 24.0 0 - 100 mmol/L   Acid-base deficit 1.7 0.0 - 2.0 mmol/L   O2 Saturation 96.0 %   Patient temperature 98.6    Collection site RIGHT RADIAL    Drawn by [phone]    Sample type ARTERIAL DRAW    Allens test (pass/fail) PASS PASS  Comprehensive metabolic panel     Status: Abnormal   Collection Time: 06/12/14  3:05 AM  Result Value Ref Range   Sodium 142 135 - 145 mmol/L   Potassium 3.5 3.5 - 5.1 mmol/L    Chloride 111 96 - 112 mmol/L   CO2 23 19 - 32 mmol/L   Glucose, Bld 102 (H) 70 - 99 mg/dL   BUN 9 6 - 23 mg/dL   Creatinine, Ser 0.74 0.50 - 1.10 mg/dL   Calcium 8.5 8.4 - 10.5 mg/dL   Total Protein 5.3 (L) 6.0 - 8.3 g/dL   Albumin 3.3 (L) 3.5 - 5.2 g/dL   AST 15 0 - 37 U/L   ALT 14 0 - 35 U/L   Alkaline Phosphatase 50 39 - 117 U/L   Total Bilirubin 0.3 0.3 - 1.2 mg/dL   GFR calc non Af Amer >90 >90 mL/min   GFR calc Af  Amer >90 >90 mL/min    Comment: (NOTE) The eGFR has been calculated using the CKD EPI equation. This calculation has not been validated in all clinical situations. eGFR's persistently <90 mL/min signify possible Chronic Kidney Disease.    Anion gap 8 5 - 15  CBC with Differential/Platelet     Status: Abnormal   Collection Time: 06/12/14  3:05 AM  Result Value Ref Range   WBC 5.7 4.0 - 10.5 K/uL   RBC 3.64 (L) 3.87 - 5.11 MIL/uL   Hemoglobin 9.6 (L) 12.0 - 15.0 g/dL    Comment: REPEATED TO VERIFY   HCT 30.4 (L) 36.0 - 46.0 %   MCV 83.5 78.0 - 100.0 fL   MCH 26.4 26.0 - 34.0 pg   MCHC 31.6 30.0 - 36.0 g/dL   RDW 16.8 (H) 11.5 - 15.5 %   Platelets 230 150 - 400 K/uL   Neutrophils Relative % 48 43 - 77 %   Neutro Abs 2.7 1.7 - 7.7 K/uL   Lymphocytes Relative 45 12 - 46 %   Lymphs Abs 2.6 0.7 - 4.0 K/uL   Monocytes Relative 4 3 - 12 %   Monocytes Absolute 0.3 0.1 - 1.0 K/uL   Eosinophils Relative 3 0 - 5 %   Eosinophils Absolute 0.1 0.0 - 0.7 K/uL   Basophils Relative 0 0 - 1 %   Basophils Absolute 0.0 0.0 - 0.1 K/uL  Protime-INR     Status: Abnormal   Collection Time: 06/12/14  3:05 AM  Result Value Ref Range   Prothrombin Time 16.5 (H) 11.6 - 15.2 seconds   INR 1.32 0.00 - 1.49  Lactic acid, plasma     Status: None   Collection Time: 06/12/14  3:05 AM  Result Value Ref Range   Lactic Acid, Venous 0.8 0.5 - 2.0 mmol/L  MRSA PCR Screening     Status: None   Collection Time: 06/12/14  4:45 AM  Result Value Ref Range   MRSA by PCR NEGATIVE NEGATIVE     Comment:        The GeneXpert MRSA Assay (FDA approved for NASAL specimens only), is one component of a comprehensive MRSA colonization surveillance program. It is not intended to diagnose MRSA infection nor to guide or monitor treatment for MRSA infections.   Troponin I     Status: None   Collection Time: 06/12/14 10:18 AM  Result Value Ref Range   Troponin I <0.03 <0.031 ng/mL    Comment:        NO INDICATION OF MYOCARDIAL INJURY.   CBC     Status: Abnormal   Collection Time: 06/13/14  2:44 AM  Result Value Ref Range   WBC 4.9 4.0 - 10.5 K/uL   RBC 3.31 (L) 3.87 - 5.11 MIL/uL   Hemoglobin 8.5 (L) 12.0 - 15.0 g/dL   HCT 27.7 (L) 36.0 - 46.0 %   MCV 83.7 78.0 - 100.0 fL   MCH 25.7 (L) 26.0 - 34.0 pg   MCHC 30.7 30.0 - 36.0 g/dL   RDW 17.0 (H) 11.5 - 15.5 %   Platelets 196 150 - 400 K/uL     Victoria Alvarez is a 24 y.o. female with history of 4 weeks postpartum, substance abuse on methadone, recent discharge for partial sagittal sinus thrombosis, CVT on Coumadin and left parasagittal venous infarct was admitted for headache and right arm worsening numbness and weakness. Subtherapeutic INR upon admission, Symptoms improving.   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 On Coumadin, however, subtherapeutic INR. Patient switched to   NOAC per neurology recommendation.  MRI no acute infarct, previous left parasagittal venous infarct  MRV completed anterior 2/3 cerebral venous thrombosis of SSS, propagation from previous partial thrombosis of SSS  warfarin prior to admission, now on eliquis (apixaban)  Patient counseled to be compliant with her antithrombotic medications  Ongoing aggressive stroke risk factor management  Therapy recommendations: Outpatient physical therapy  Disposition: Discharge home today after being seen by neurology  Fluctuating INR  Since INR difficult to control neurology  Recommend Eliquis for better  anticoagulation  Compliance again emphasized  Seizure  Had a witnessed seizure episode overnight  As per her husband, patient both arm flexion and both leg extension , tonic, lasting 30 seconds  Was giving Keppra 1000 load  On Keppra 500 twice a day  EEG pending  Other Active Problems  Postpartum - 4 weeks, no more-no bleeding  Substance abuse - chronic pain syndrome on methadone  Emphasized no breast-feeding  Other Pertinent History  Depressed mood  Patient has already had appointment with Dr. Leonie Man as follow-up on 07/18/2014.      Discharge Exam:    Blood pressure 100/52, pulse 57, temperature 98 F (36.7 C), temperature source Oral, resp. rate 20, height 5' 2"  (1.575 m), weight 80.5 kg (177 lb 7.5 oz), SpO2 100 %, unknown if currently breastfeeding.  General: Well-developed and nourished.  Eyes: Anicteric no pallor.  ENT: No discharge from the years eyes nose and mouth.  Neck: No mass felt. No neck rigidity.  Cardiovascular: S1-S2 heard.  Respiratory: No rhonchi or crepitations.  Abdomen: Soft nontender bowel sounds present.  Skin: No rash.  Musculoskeletal: No edema.  Psychiatric: Patient is sedated.  Neurologic: Patient is sedated and follows minimal commands at this time. Patient did receive Ativan in the ER and also Keppra. PERRLA positive. No facial asymmetry. Moves all extremities.        Discharge Instructions    Diet - low sodium heart healthy    Complete by:  As directed      Increase activity slowly    Complete by:  As directed            Follow-up Information    Follow up with LAND, PHILLIP, PA-C. Schedule an appointment as soon as possible for a visit in 3 days.   Specialty:  Physician Assistant   Contact information:   [address] [place] [address] [phone]       Follow up with SETHI,PRAMOD, MD. Call in 5 weeks.   Specialties:  Neurology, Radiology   Contact information:   [address] [place] [address] [phone]       Signed: Reyne Dumas 06/13/2014, 10:32 AM

## 2014-06-13 NOTE — Progress Notes (Signed)
EEG completed, results pending. 

## 2014-06-13 NOTE — Care Management Note (Addendum)
    Page 1 of 1   06/13/2014     2:15:13 PM CARE MANAGEMENT NOTE 06/13/2014  Patient:  [facility]   Account Number:  [id number]  Date Initiated:  06/13/2014  Documentation initiated by:  Whitman Hero  Subjective/Objective Assessment:   PTA from home with boyfriend/ children admitted with cerebral vein thrombosis. Prior admit 3/8-3/10 , (l) parietal infarct.     Action/Plan:   Return to home when medically stable.   Anticipated DC Date:     Anticipated DC Plan:  Marie  CM consult      Choice offered to / List presented to:  C-1 Patient           Status of service:  Completed, signed off Medicare Important Message given?  NO (If response is "NO", the following Medicare IM given date fields will be blank) Date Medicare IM given:   Medicare IM given by:   Date Additional Medicare IM given:   Additional Medicare IM given by:    Discharge Disposition:    Per UR Regulation:  Reviewed for med. necessity/level of care/duration of stay  If discussed at [place] of Stay Meetings, dates discussed:    Comments:   06/13/2014 @ [place] RN,BSN,CM Pt opted for outpt OT, CM to send referral to outpt therapy.  06/13/2014 @ 11:45 Whitman Hero RN,BSN,CM Recommendations  for HHRN/PT were made per MD/OT. cm Spoke with pt regarding recommendations, choice list given. Pt expressed ADV.services to use .Referral made with [facility]) for [facility] and  OT per CM. No others needs identified per CM.

## 2014-06-13 NOTE — Procedures (Signed)
ELECTROENCEPHALOGRAM REPORT  Patient: David Towson       Room #: 0X41 EEG No. ID: [id number] Age: 24 y.o.        Sex: female Referring Physician: Derrek Gu Report Date:  06/13/2014        Interpreting Physician: Anthony Sar  History: Tyffany Waldrop is an 24 y.o. 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  Indications for study:  Rule out focal seizure disorder.  Technique: This is an 18 channel routine scalp EEG performed at the bedside with bipolar and monopolar montages arranged in accordance to the international 10/20 system of electrode placement.   Description:This EEG recording was performed during wakefulness and during sleep. Predominant activity during wakefulness consisted of 10-11  Hz alpha rhythm with good attenuation with eye opening. Photic stimulation was not performed.  Hyperventilation was not performed. There was symmetrical slowing of background activity with mixed irregular delta and theta activity diffusely. Symmetrical vertex waves, sleep spindles and K-complexes are recorded during stage II of sleep. No epileptiform discharges occurred during wakefulness nor during sleep. There was no abnormal slowing of cerebral activity.  Interpretation: This is a normal EEG recording during wakefulness and during sleep. No evidence of an epileptic disorder was demonstrated. However, a normal EEG recording in and of itself does not rule out seizure disorder.  Rush Farmer M.D. [facility] [phone]

## 2014-06-15 ENCOUNTER — Telehealth: Payer: Self-pay | Admitting: Neurology

## 2014-06-15 NOTE — Telephone Encounter (Signed)
Pt is calling stating that apixaban (ELIQUIS) 5 MG TABS tablet and levETIRAcetam (KEPPRA) 500 MG tablet is making her feel very high and she does not like feeling this way.  Please call and advise what she needs to do.

## 2014-06-15 NOTE — Telephone Encounter (Signed)
Spoke to patient. Complains of drowsiness and symptoms mentioned in previous message. We went over discharge summary. Patient has an appt with PCP tomorrow morning.  Patient has an appt w/ Dr. Pearlean Brownie on 07/18/14. Advised patient to speak w/ the PCP about symptoms. Patient agreed and said she will contact our office tomorrow to let us know what PCP advised.

## 2014-06-16 ENCOUNTER — Ambulatory Visit: Payer: Medicaid Other | Attending: Family Medicine | Admitting: Family Medicine

## 2014-06-16 ENCOUNTER — Encounter: Payer: Self-pay | Admitting: Family Medicine

## 2014-06-16 VITALS — BP 110/70 | HR 66 | Temp 98.1°F | Resp 18 | Ht 63.0 in | Wt 183.0 lb

## 2014-06-16 DIAGNOSIS — D649 Anemia, unspecified: Secondary | ICD-10-CM | POA: Diagnosis not present

## 2014-06-16 DIAGNOSIS — I6330 Cerebral infarction due to thrombosis of unspecified cerebral artery: Secondary | ICD-10-CM | POA: Diagnosis not present

## 2014-06-16 DIAGNOSIS — D509 Iron deficiency anemia, unspecified: Secondary | ICD-10-CM

## 2014-06-16 DIAGNOSIS — Z9114 Patient's other noncompliance with medication regimen: Secondary | ICD-10-CM | POA: Insufficient documentation

## 2014-06-16 DIAGNOSIS — D62 Acute posthemorrhagic anemia: Secondary | ICD-10-CM | POA: Insufficient documentation

## 2014-06-16 DIAGNOSIS — F17210 Nicotine dependence, cigarettes, uncomplicated: Secondary | ICD-10-CM | POA: Insufficient documentation

## 2014-06-16 DIAGNOSIS — F1110 Opioid abuse, uncomplicated: Secondary | ICD-10-CM | POA: Insufficient documentation

## 2014-06-16 DIAGNOSIS — Z7901 Long term (current) use of anticoagulants: Secondary | ICD-10-CM | POA: Diagnosis not present

## 2014-06-16 DIAGNOSIS — I69351 Hemiplegia and hemiparesis following cerebral infarction affecting right dominant side: Secondary | ICD-10-CM | POA: Diagnosis present

## 2014-06-16 DIAGNOSIS — Z720 Tobacco use: Secondary | ICD-10-CM | POA: Diagnosis not present

## 2014-06-16 DIAGNOSIS — R569 Unspecified convulsions: Secondary | ICD-10-CM | POA: Diagnosis not present

## 2014-06-16 DIAGNOSIS — Z91030 Bee allergy status: Secondary | ICD-10-CM

## 2014-06-16 DIAGNOSIS — Z91038 Other insect allergy status: Secondary | ICD-10-CM

## 2014-06-16 DIAGNOSIS — F17200 Nicotine dependence, unspecified, uncomplicated: Secondary | ICD-10-CM

## 2014-06-16 DIAGNOSIS — R51 Headache: Secondary | ICD-10-CM | POA: Insufficient documentation

## 2014-06-16 DIAGNOSIS — G8929 Other chronic pain: Secondary | ICD-10-CM

## 2014-06-16 DIAGNOSIS — I495 Sick sinus syndrome: Secondary | ICD-10-CM | POA: Insufficient documentation

## 2014-06-16 DIAGNOSIS — R519 Headache, unspecified: Secondary | ICD-10-CM | POA: Insufficient documentation

## 2014-06-16 LAB — CBC WITH DIFFERENTIAL/PLATELET
Basophils Absolute: 0 10*3/uL (ref 0.0–0.1)
Basophils Relative: 0 % (ref 0–1)
Eosinophils Absolute: 0.1 10*3/uL (ref 0.0–0.7)
Eosinophils Relative: 2 % (ref 0–5)
HCT: 32.1 % — ABNORMAL LOW (ref 36.0–46.0)
Hemoglobin: 10 g/dL — ABNORMAL LOW (ref 12.0–15.0)
Lymphocytes Relative: 35 % (ref 12–46)
Lymphs Abs: 1.9 10*3/uL (ref 0.7–4.0)
MCH: 25.4 pg — ABNORMAL LOW (ref 26.0–34.0)
MCHC: 31.2 g/dL (ref 30.0–36.0)
MCV: 81.5 fL (ref 78.0–100.0)
MPV: 10 fL (ref 8.6–12.4)
Monocytes Absolute: 0.3 10*3/uL (ref 0.1–1.0)
Monocytes Relative: 5 % (ref 3–12)
Neutro Abs: 3.1 10*3/uL (ref 1.7–7.7)
Neutrophils Relative %: 58 % (ref 43–77)
Platelets: 246 10*3/uL (ref 150–400)
RBC: 3.94 MIL/uL (ref 3.87–5.11)
RDW: 18.3 % — ABNORMAL HIGH (ref 11.5–15.5)
WBC: 5.3 10*3/uL (ref 4.0–10.5)

## 2014-06-16 MED ORDER — EPINEPHRINE 0.3 MG/0.3ML IJ SOAJ: 0.3000 mg | Freq: Once | INTRAMUSCULAR | Status: DC

## 2014-06-16 MED ORDER — APIXABAN 5 MG PO TABS
5.0000 mg | ORAL_TABLET | Freq: Two times a day (BID) | ORAL | Status: DC
Start: 2014-06-19 — End: 2014-10-16

## 2014-06-16 NOTE — Progress Notes (Signed)
Subjective:    Patient ID: Victoria Alvarez, female    DOB: 07-30-1990, 24 y.o.   MRN: [id number]  HPI  Victoria Alvarez is a 24 year old 6 weeks postpartum female who presents today to establish care and is a hospital follow-up from admission at [facility] between 06/11/2014 and 06/13/2014 .She was managed for a completed anterior 2/3  left superior sagittal sinus CVT which had progressed from previous  Partial thrombosis of superior  sagttal sins  between 05/17/2014 and 05/19/2014 due to noncompliance with Coumadin therapy. She had presented with worsening right arm weakness and numbness. During her hospital course she was switched to the new oral anticoagulant -Eliquis for better compliance she also had a seizure episode and received a loading dose of Keppra 1000 mg and had  further workup with EEG.   Interval history: She continues to complain of intermittent headaches and her right hand feels funny. She does complain of increased somnolence and sometimes falls asleep while feeding her baby. She has not had any recent seizures. She currently attends an methadone clinic where she receives methadone for chronic substance abuse.  Past Medical History  Diagnosis Date  . Interstitial cystitis   . Chronic pelvic pain in female   . Headache(784.0)   . Infection     UTI  . Pyelonephritis affecting pregnancy in first trimester July 2015  . Depression 2007  . Substance abuse   . Pneumonia 03/2014  . Hemiparesis ~ 05/15/2014    RUE/RLE "worse in my arm"  . Seizures      Past Surgical History  Procedure Laterality Date  . Vagina surgery  ~ 2011    tumor removed, went to cancer center in [facility]  . Cesarean section N/A 05/06/2014    Procedure: CESAREAN SECTION;  Surgeon: Levie Heritage, DO;  Location: [facility];  Service: Obstetrics;  Laterality: N/A;  . Inguinal hernia repair Left ~ 2014     History   Social History  . Marital Status: Single    Spouse Name: N/A  . Number of Children: N/A  . Years  of Education: N/A   Occupational History  . Not on file.   Social History Main Topics  . Smoking status: Current Every Day Smoker -- 0.50 packs/day for 8 years    Types: Cigarettes  . Smokeless tobacco: Never Used     Comment: cutting back  . Alcohol Use: No  . Drug Use: No     Comment: Pt on Methadone "was on pain pills; weaned off while I was pregnant so it would be safer"  . Sexual Activity: Not Currently    Birth Control/ Protection: None   Other Topics Concern  . Not on file   Social History Narrative     Allergies  Allergen Reactions  . Bee Venom Swelling  . Ciprofloxacin Diarrhea and Nausea And Vomiting     Current Outpatient Prescriptions on File Prior to Visit  Medication Sig Dispense Refill  . levETIRAcetam (KEPPRA) 500 MG tablet Take 1 tablet (500 mg total) by mouth 2 (two) times daily. 60 tablet 3  . methadone (DOLOPHINE) 10 MG/5ML solution Take 72 mg by mouth daily.     . ondansetron (ZOFRAN ODT) 8 MG disintegrating tablet Take 1 tablet (8 mg total) by mouth every 8 (eight) hours as needed for nausea or vomiting. 20 tablet 0   No current facility-administered medications on file prior to visit.       Review of Systems  General: negative for fever,  weight loss, appetite change Eyes: no visual symptoms. ENT: no ear symptoms, no sinus tenderness, no nasal congestion or sore throat. Neck: no pain  Respiratory: no wheezing, shortness of breath, cough Cardiovascular: no chest pain, no dyspnea on exertion, no pedal edema, no orthopnea. Gastrointestinal: no abdominal pain, no diarrhea, no constipation Genito-Urinary: no urinary frequency, no dysuria, no polyuria. Hematologic: no bruising Endocrine: no cold or heat intolerance Neurological: see HPI Musculoskeletal: no joint pains, no joint swelling Skin: no pruritus, no rash. Psychological: no depression, no anxiety,       Objective:  Filed Vitals:   06/16/14 1057  BP: 110/70  Pulse: 66  Temp:  98.1 F (36.7 C)  Resp: 18      Physical Exam Constitutional: normal appearing,  Eyes: PERRLA HENT: Head is atraumatic, normal sinuses, normal oropharynx, normal appearing tonsils and palate Neck: normal range of motion, no thyromegaly, no JVD cardiovascular: normal rate and rhythm, normal heart sounds, no murmurs, rub or gallop, no pedal edema Respiratory: clear to auscultation bilaterally, no wheezes, no rales, no rhonchi Abdomen: soft, not tender to palpation, normal bowel sounds, no enlarged organs Extremities: Full ROM, no tenderness in joints Skin: warm and dry, no lesions. Neurological: alert, oriented x3, cranial nerves I-XII grossly intact; strength in upper right extremity is 4/5, upper left extremity is 5/5, lower right extremity is 4+/5, lower left extremity 5/5. Psychological: normal mood.  CBC Latest Ref Rng 06/13/2014 06/12/2014 06/11/2014  WBC 4.0 - 10.5 K/uL 4.9 5.7 -  Hemoglobin 12.0 - 15.0 g/dL 4.0(J) 8.1(X) 91.4  Hematocrit 36.0 - 46.0 % 27.7(L) 30.4(L) 36.0  Platelets 150 - 400 K/uL 196 230 -      MR Brain w/o contrast IMPRESSION: MRI HEAD: Further propagation of superior sagittal sinus and cortical vein thrombosis without venous infarction. Small area of residual LEFT frontal gliosis.  MRA HEAD: Further propagation of superior sagittal sinus thrombosis involving proximal and mid segments.  Acute findings discussed with and reconfirmed by Dr.OSVALDO CAMILO on 06/12/2014 at 12:40 am.         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  Cerebral thrombosis with cerebral infarction: Anticoagulation for at least 6 mos as per neurology as she is at high risk of repeat thrombosis and worsening neurological deficits.. She does have some residual  weakness in her right upper extremity; she was supposed to undergo outpatient physical therapy and we have called advanced home who confirmed that the patient was hardly ever home when they made attempts to visit her. Her headaches could also be coming from this.  Seizures: Currently on Keppra which I think could be contributing to her somnolent and I have advised her to discuss this with neurology  Anemia: Review of her labs indicated a drop in her hematocrit from 36 on admission to 27.7 at discharge and so I will repeat it today.  Bee sting allergy: I am giving her a prescription for an EpiPen as per request.

## 2014-06-16 NOTE — Patient Instructions (Signed)
Smoking Cessation Quitting smoking is important to your health and has many advantages. However, it is not always easy to quit since nicotine is a very addictive drug. Oftentimes, people try 3 times or more before being able to quit. This document explains the best ways for you to prepare to quit smoking. Quitting takes hard work and a lot of effort, but you can do it. ADVANTAGES OF QUITTING SMOKING  You will live longer, feel better, and live better.  Your body will feel the impact of quitting smoking almost immediately.  Within 20 minutes, blood pressure decreases. Your pulse returns to its normal level.  After 8 hours, carbon monoxide levels in the blood return to normal. Your oxygen level increases.  After 24 hours, the chance of having a heart attack starts to decrease. Your breath, hair, and body stop smelling like smoke.  After 48 hours, damaged nerve endings begin to recover. Your sense of taste and smell improve.  After 72 hours, the body is virtually free of nicotine. Your bronchial tubes relax and breathing becomes easier.  After 2 to 12 weeks, lungs can hold more air. Exercise becomes easier and circulation improves.  The risk of having a heart attack, stroke, cancer, or lung disease is greatly reduced.  After 1 year, the risk of coronary heart disease is cut in half.  After 5 years, the risk of stroke falls to the same as a nonsmoker.  After 10 years, the risk of lung cancer is cut in half and the risk of other cancers decreases significantly.  After 15 years, the risk of coronary heart disease drops, usually to the level of a nonsmoker.  If you are pregnant, quitting smoking will improve your chances of having a healthy baby.  The people you live with, especially any children, will be healthier.  You will have extra money to spend on things other than cigarettes. QUESTIONS TO THINK ABOUT BEFORE ATTEMPTING TO QUIT You may want to talk about your answers with your  health care provider.  Why do you want to quit?  If you tried to quit in the past, what helped and what did not?  What will be the most difficult situations for you after you quit? How will you plan to handle them?  Who can help you through the tough times? Your family? Friends? A health care provider?  What pleasures do you get from smoking? What ways can you still get pleasure if you quit? Here are some questions to ask your health care provider:  How can you help me to be successful at quitting?  What medicine do you think would be best for me and how should I take it?  What should I do if I need more help?  What is smoking withdrawal like? How can I get information on withdrawal? GET READY  Set a quit date.  Change your environment by getting rid of all cigarettes, ashtrays, matches, and lighters in your home, car, or work. Do not let people smoke in your home.  Review your past attempts to quit. Think about what worked and what did not. GET SUPPORT AND ENCOURAGEMENT You have a better chance of being successful if you have help. You can get support in many ways.  Tell your family, friends, and coworkers that you are going to quit and need their support. Ask them not to smoke around you.  Get individual, group, or telephone counseling and support. Programs are available at local hospitals and health centers. Call   your local health department for information about programs in your area.  Spiritual beliefs and practices may help some smokers quit.  Download a "quit meter" on your computer to keep track of quit statistics, such as how long you have gone without smoking, cigarettes not smoked, and money saved.  Get a self-help book about quitting smoking and staying off tobacco. LEARN NEW SKILLS AND BEHAVIORS  Distract yourself from urges to smoke. Talk to someone, go for a walk, or occupy your time with a task.  Change your normal routine. Take a different route to work.  Drink tea instead of coffee. Eat breakfast in a different place.  Reduce your stress. Take a hot bath, exercise, or read a book.  Plan something enjoyable to do every day. Reward yourself for not smoking.  Explore interactive web-based programs that specialize in helping you quit. GET MEDICINE AND USE IT CORRECTLY Medicines can help you stop smoking and decrease the urge to smoke. Combining medicine with the above behavioral methods and support can greatly increase your chances of successfully quitting smoking.  Nicotine replacement therapy helps deliver nicotine to your body without the negative effects and risks of smoking. Nicotine replacement therapy includes nicotine gum, lozenges, inhalers, nasal sprays, and skin patches. Some may be available over-the-counter and others require a prescription.  Antidepressant medicine helps people abstain from smoking, but how this works is unknown. This medicine is available by prescription.  Nicotinic receptor partial agonist medicine simulates the effect of nicotine in your brain. This medicine is available by prescription. Ask your health care provider for advice about which medicines to use and how to use them based on your health history. Your health care provider will tell you what side effects to look out for if you choose to be on a medicine or therapy. Carefully read the information on the package. Do not use any other product containing nicotine while using a nicotine replacement product.  RELAPSE OR DIFFICULT SITUATIONS Most relapses occur within the first 3 months after quitting. Do not be discouraged if you start smoking again. Remember, most people try several times before finally quitting. You may have symptoms of withdrawal because your body is used to nicotine. You may crave cigarettes, be irritable, feel very hungry, cough often, get headaches, or have difficulty concentrating. The withdrawal symptoms are only temporary. They are strongest  when you first quit, but they will go away within 10-14 days. To reduce the chances of relapse, try to:  Avoid drinking alcohol. Drinking lowers your chances of successfully quitting.  Reduce the amount of caffeine you consume. Once you quit smoking, the amount of caffeine in your body increases and can give you symptoms, such as a rapid heartbeat, sweating, and anxiety.  Avoid smokers because they can make you want to smoke.  Do not let weight gain distract you. Many smokers will gain weight when they quit, usually less than 10 pounds. Eat a healthy diet and stay active. You can always lose the weight gained after you quit.  Find ways to improve your mood other than smoking. FOR MORE INFORMATION  www.smokefree.gov  Document Released: 02/19/2001 Document Revised: 07/12/2013 Document Reviewed: 06/06/2011 ExitCare Patient Information 2015 [employer]. This information is not intended to replace advice given to you by your health care provider. Make sure you discuss any questions you have with your health care provider.  

## 2014-06-16 NOTE — Progress Notes (Signed)
Patient went to hospital thinking she was having stroke, had seizure while in hospital, reports tongue pain Patient needs refill on eloquis.  Patient reports pain in right hand, described as dull pain, at level 4, constant pain, has been hurting since hospital admission. Patient reports headache at level 7, started today. Discharged from hospital 4/4. Patient reports one of her meds makes her feel high constantly and makes her fall asleep.

## 2014-06-20 ENCOUNTER — Telehealth: Payer: Self-pay

## 2014-06-20 NOTE — Telephone Encounter (Signed)
Nurse called patient, pt verified date of birth. Nurse informed patient that labs show patient is still anemic but it has improved and to eat iron rich foods to help with anemia. Patient reports that she thinks she is having seizures in the mornings between 7am-10am. Patient describes seizures as affecting her right arm and going up into right side of face. Patient reports breathing gets faster when this happens. Nurse instructed patient to call 911 if patient feels she is having a seizure and it becomes emergent, or if she is having problems breathing. Patient voices understanding.

## 2014-06-20 NOTE — Telephone Encounter (Signed)
Nurse called patient, verified birth date. Nurse explained to patient importance of calling her neurologist concerning seizures as advised by Dr. Venetia Night.Patient voices understanding of calling neurologist for seizures concerns.

## 2014-06-22 ENCOUNTER — Ambulatory Visit: Payer: Self-pay | Admitting: Neurology

## 2014-06-23 ENCOUNTER — Ambulatory Visit: Payer: Medicaid Other | Admitting: Advanced Practice Midwife

## 2014-07-07 ENCOUNTER — Telehealth: Payer: Self-pay | Admitting: Neurology

## 2014-07-07 NOTE — Telephone Encounter (Signed)
Patient has an appointment with Dr. Leonie Man on 07-18-14 but has been experiencing migraines for the past 2 days. Patient is taking Tylenol but it does not seem to help. Patient is on a blood thinner Eliquis 5 mg and is concerned about taking anything besides Tylenol for the migraine. Please call patient and advise. Thank you.

## 2014-07-07 NOTE — Telephone Encounter (Signed)
i called patient and left message to cal lme back to discuss migraine medicine

## 2014-07-08 ENCOUNTER — Other Ambulatory Visit: Payer: Self-pay | Admitting: Neurology

## 2014-07-08 MED ORDER — TRAMADOL HCL 50 MG PO TABS: 100.0000 mg | ORAL_TABLET | Freq: Four times a day (QID) | ORAL | Status: DC | PRN

## 2014-07-08 NOTE — Telephone Encounter (Signed)
I s[poke to patient and prescribed tramadol

## 2014-07-08 NOTE — Telephone Encounter (Signed)
Patient called/returning DR. Sethi's call from 07/07/14. Pt's c/b [phone]

## 2014-07-08 NOTE — Telephone Encounter (Signed)
Spoke with patient and informed her that her prescription for Tramadol is ready for pick up at the front desk. Informed her of this office's hours. She verbalized understanding.

## 2014-07-10 DIAGNOSIS — D6859 Other primary thrombophilia: Secondary | ICD-10-CM

## 2014-07-10 DIAGNOSIS — D649 Anemia, unspecified: Secondary | ICD-10-CM

## 2014-07-10 HISTORY — DX: Other primary thrombophilia: D68.59

## 2014-07-10 HISTORY — DX: Anemia, unspecified: D64.9

## 2014-07-12 ENCOUNTER — Other Ambulatory Visit: Payer: Self-pay | Admitting: Family Medicine

## 2014-07-12 ENCOUNTER — Encounter: Payer: Self-pay | Admitting: Family Medicine

## 2014-07-12 ENCOUNTER — Ambulatory Visit: Payer: Medicaid Other | Attending: Family Medicine | Admitting: Family Medicine

## 2014-07-12 VITALS — BP 120/73 | HR 78 | Temp 98.9°F | Resp 18 | Ht 63.0 in | Wt 183.0 lb

## 2014-07-12 DIAGNOSIS — K529 Noninfective gastroenteritis and colitis, unspecified: Secondary | ICD-10-CM

## 2014-07-12 DIAGNOSIS — D509 Iron deficiency anemia, unspecified: Secondary | ICD-10-CM

## 2014-07-12 DIAGNOSIS — D62 Acute posthemorrhagic anemia: Secondary | ICD-10-CM

## 2014-07-12 DIAGNOSIS — I636 Cerebral infarction due to cerebral venous thrombosis, nonpyogenic: Secondary | ICD-10-CM

## 2014-07-12 DIAGNOSIS — F1120 Opioid dependence, uncomplicated: Secondary | ICD-10-CM | POA: Insufficient documentation

## 2014-07-12 LAB — CBC WITH DIFFERENTIAL/PLATELET
Basophils Absolute: 0 10*3/uL (ref 0.0–0.1)
Basophils Relative: 0 % (ref 0–1)
Eosinophils Absolute: 0.1 10*3/uL (ref 0.0–0.7)
Eosinophils Relative: 1 % (ref 0–5)
HCT: 31.7 % — ABNORMAL LOW (ref 36.0–46.0)
Hemoglobin: 10.2 g/dL — ABNORMAL LOW (ref 12.0–15.0)
Lymphocytes Relative: 49 % — ABNORMAL HIGH (ref 12–46)
Lymphs Abs: 2.5 10*3/uL (ref 0.7–4.0)
MCH: 23.2 pg — ABNORMAL LOW (ref 26.0–34.0)
MCHC: 32.2 g/dL (ref 30.0–36.0)
MCV: 72.2 fL — ABNORMAL LOW (ref 78.0–100.0)
MPV: 9.6 fL (ref 8.6–12.4)
Monocytes Absolute: 0.4 10*3/uL (ref 0.1–1.0)
Monocytes Relative: 8 % (ref 3–12)
Neutro Abs: 2.2 10*3/uL (ref 1.7–7.7)
Neutrophils Relative %: 42 % — ABNORMAL LOW (ref 43–77)
Platelets: 270 10*3/uL (ref 150–400)
RBC: 4.39 MIL/uL (ref 3.87–5.11)
RDW: 19.4 % — ABNORMAL HIGH (ref 11.5–15.5)
WBC: 5.2 10*3/uL (ref 4.0–10.5)

## 2014-07-12 MED ORDER — DIPHENOXYLATE-ATROPINE 2.5-0.025 MG PO TABS: 2.0000 | ORAL_TABLET | Freq: Four times a day (QID) | ORAL | Status: DC | PRN

## 2014-07-12 NOTE — Patient Instructions (Addendum)
Victoria Alvarez,  Thank you for coming in today. It was a pleasure meeting you. I look forward to being your primary doctor.  1. Diarrhea: I am most suspicious for a stool burden with leakage, also ruling out a bacteria called C. Diff. A lactose intolerance test requires you to take an prescription of lactose after fasting and then have blood drawn in specific intervals, we will do this if the other test do not reveal the cause of diarrhea  Plan:  Take lomotil to help stop diarrhea Blood work to check white count, pancrease and electrolytes  Abdominal x-ray, tomorrow, [facility], first floor  Stool sample to rule out C. Diff   2. Anemia: Checking iron levels today  F/u in 4 weeks   Dr. Armen Pickup

## 2014-07-12 NOTE — Progress Notes (Signed)
   Subjective:    Patient ID: Victoria Alvarez, female    DOB: 1990/07/03, 24 y.o.   MRN: [id number]   Diarrhea  This is a chronic problem. The current episode started more than 1 month ago. The problem occurs more than 10 times per day. The problem has been waxing and waning. The stool consistency is described as watery. The patient states that diarrhea awakens her from sleep. Associated symptoms include abdominal pain and increased flatus. Pertinent negatives include no arthralgias, chills, fever, sweats, vomiting or weight loss. Nothing aggravates the symptoms. There are no known risk factors. She has tried nothing for the symptoms. The treatment provided no relief. There is no history of bowel resection, inflammatory bowel disease, irritable bowel syndrome, malabsorption, a recent abdominal surgery or short gut syndrome.      Review of Systems  Constitutional: Negative for fever, chills and weight loss.  Gastrointestinal: Positive for abdominal pain, diarrhea and flatus. Negative for vomiting.  Musculoskeletal: Negative for arthralgias.       Objective:   Physical Exam BP 120/73 mmHg  Pulse 78  Temp(Src) 98.9 F (37.2 C) (Oral)  Resp 18  Ht 5\' 3"  (1.6 m)  Wt 183 lb (83.008 kg)  BMI 32.43 kg/m2  SpO2 96%  Wt Readings from Last 3 Encounters:  07/12/14 183 lb (83.008 kg)  06/16/14 183 lb (83.008 kg)  06/12/14 177 lb 7.5 oz (80.5 kg)  General appearance: alert, cooperative and no distress  Eyes: conjunctivae/corneas clear. PERRL, EOM's intact.  Throat: normal findings: soft palate, uvula, and tonsils normal and oropharynx pink & moist without lesions or evidence of thrush Lungs: clear to auscultation bilaterally Heart: regular rate and rhythm, S1, S2 normal, no murmur, click, rub or gallop Abdomen: soft, non-tender; bowel sounds normal; no masses,  no organomegaly Extremities: extremities normal, atraumatic, no cyanosis or edema Pulses: 2+ and symmetric Skin: Skin color, texture,  turgor normal. No rashes or lesions       Assessment & Plan:

## 2014-07-12 NOTE — Progress Notes (Signed)
Establish Care Complaining of diarrhea x 2 month  Abdominal pain

## 2014-07-13 ENCOUNTER — Ambulatory Visit (HOSPITAL_COMMUNITY)
Admission: RE | Admit: 2014-07-13 | Discharge: 2014-07-13 | Disposition: A | Discharge status: Home / Self Care | Payer: Medicaid Other | Source: Ambulatory Visit | Attending: Family Medicine | Admitting: Family Medicine

## 2014-07-13 ENCOUNTER — Telehealth: Payer: Self-pay | Admitting: *Deleted

## 2014-07-13 DIAGNOSIS — K529 Noninfective gastroenteritis and colitis, unspecified: Secondary | ICD-10-CM | POA: Insufficient documentation

## 2014-07-13 LAB — COMPLETE METABOLIC PANEL WITH GFR
ALT: 17 U/L (ref 0–35)
AST: 14 U/L (ref 0–37)
Albumin: 4.4 g/dL (ref 3.5–5.2)
Alkaline Phosphatase: 58 U/L (ref 39–117)
BUN: 18 mg/dL (ref 6–23)
CO2: 22 mEq/L (ref 19–32)
Calcium: 9.7 mg/dL (ref 8.4–10.5)
Chloride: 104 mEq/L (ref 96–112)
Creat: 0.81 mg/dL (ref 0.50–1.10)
GFR, Est African American: >89 mL/min
GFR, Est Non African American: >89 mL/min
Glucose, Bld: 85 mg/dL (ref 70–99)
Potassium: 3.9 mEq/L (ref 3.5–5.3)
Sodium: 137 mEq/L (ref 135–145)
Total Bilirubin: 0.3 mg/dL (ref 0.2–1.2)
Total Protein: 7 g/dL (ref 6.0–8.3)

## 2014-07-13 LAB — IRON AND TIBC
Iron: 19 ug/dL — ABNORMAL LOW (ref 42–145)
UIBC: >575 ug/dL — ABNORMAL HIGH (ref 125–400)

## 2014-07-13 LAB — HIV ANTIBODY (ROUTINE TESTING W REFLEX): HIV 1&2 Ab, 4th Generation: NONREACTIVE

## 2014-07-13 LAB — LIPASE: Lipase: 29 U/L (ref 0–75)

## 2014-07-13 MED ORDER — MAGNESIUM HYDROXIDE 400 MG/5ML PO SUSP: 15.0000 mL | Freq: Every day | ORAL | Status: DC

## 2014-07-13 NOTE — Telephone Encounter (Signed)
Pt inform of stool in colon advised to used milk of magnesia  Pt stated that will give her more Diarrhea and hung up. Unable to give her Iron results I call back again left voice message to return call for more information

## 2014-07-13 NOTE — Telephone Encounter (Signed)
Patient called returning nurse's phone call, please f/u with patient

## 2014-07-13 NOTE — Assessment & Plan Note (Addendum)
1. Diarrhea: I am most suspicious for a stool burden with leakage, also ruling out a bacteria called C. Diff. A lactose intolerance test requires you to take an prescription of lactose after fasting and then have blood drawn in specific intervals, we will do this if the other test do not reveal the cause of diarrhea  Plan:  Take lomotil to help stop diarrhea Blood work to check white count, pancrease and electrolytes  Abdominal x-ray, tomorrow, [facility], first floor  Stool sample to rule out C. Diff

## 2014-07-13 NOTE — Telephone Encounter (Signed)
-----   Message from Dessa Phi, MD sent at 07/13/2014  8:36 AM EDT ----- Normal lipase- test for pancrease inflammation. Iron is low, will start patient on oral iron Hgb is up to 10.2 from 8.5 at recent lowest  Normal CMP-electrolytes Screening HIV negative

## 2014-07-13 NOTE — Telephone Encounter (Signed)
-----   Message from Dessa Phi, MD sent at 07/13/2014 12:52 PM EDT ----- X-ray reveals no bowel obstruction, just a moderate amount of stool in the colon. I recommend a clean out with milk of magnesia nightly for next 7 days.

## 2014-07-14 LAB — C. DIFFICILE GDH AND TOXIN A/B
C. difficile GDH: NOT DETECTED
C. difficile Toxin A/B: NOT DETECTED

## 2014-07-15 NOTE — Telephone Encounter (Signed)
Patient called is returning nurse's phone call, to f/u to review results.

## 2014-07-15 NOTE — Assessment & Plan Note (Signed)
.   Anemia: Checking iron levels today   

## 2014-07-15 NOTE — Assessment & Plan Note (Signed)
.   Anemia: Checking iron levels today

## 2014-07-18 ENCOUNTER — Ambulatory Visit (INDEPENDENT_AMBULATORY_CARE_PROVIDER_SITE_OTHER): Payer: Medicaid Other | Admitting: Neurology

## 2014-07-18 ENCOUNTER — Encounter: Payer: Self-pay | Admitting: Neurology

## 2014-07-18 ENCOUNTER — Ambulatory Visit: Payer: Medicaid Other | Admitting: Family Medicine

## 2014-07-18 VITALS — BP 125/65 | HR 81 | Ht 62.0 in | Wt 186.0 lb

## 2014-07-18 DIAGNOSIS — F32A Depression, unspecified: Secondary | ICD-10-CM

## 2014-07-18 DIAGNOSIS — G08 Intracranial and intraspinal phlebitis and thrombophlebitis: Secondary | ICD-10-CM

## 2014-07-18 DIAGNOSIS — F329 Major depressive disorder, single episode, unspecified: Secondary | ICD-10-CM

## 2014-07-18 DIAGNOSIS — G43019 Migraine without aura, intractable, without status migrainosus: Secondary | ICD-10-CM | POA: Diagnosis not present

## 2014-07-18 MED ORDER — FLUOXETINE HCL 20 MG PO CAPS: 20.0000 mg | ORAL_CAPSULE | Freq: Every day | ORAL | Status: DC

## 2014-07-18 MED ORDER — ELETRIPTAN HYDROBROMIDE 40 MG PO TABS: 40.0000 mg | ORAL_TABLET | ORAL | Status: DC | PRN

## 2014-07-18 NOTE — Progress Notes (Signed)
[facility] [address] [place]. [place] [address] [phone]       OFFICE FOLLOW-UP NOTE  [name] Date of Birth:  [birth year] Medical Record Number:  [id number]   HPI: 24 year old Caucasian lady seen today for the first office follow-up visit following hospital admission on 05/17/14 as well as on 06/11/14. She presented with headache and right-sided tingling and numbness. From right hip to the toes but lasted 5 minutes and resolved but left her with some heavy sensation. She had difficulty driving and pressing on the gas pedal. A few days later she developed a headache and persistent tingling and numbness on the right side of the body lasted a couple of hours and again had some right leg weakness. She denied any vertigo, double vision slurred speech language speech impairment. 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 MRV confirm these findings. She was initially started on IV heparin and subsequently switched to warfarin. She was however readmitted a few weeks later with worsening headache and right-sided symptoms with suboptimal INR despite good compliance on warfarin. Repeat MRI scan which I reviewed showed no worsening of sinus thrombosis or new infarcts. Warfarin was switched to eliquis which she is been taking regularly without any bleeding or side effects. She is also on Keppra 500 twice daily for seizures and does complain that if she misses a dose or is late she feels some numbness in her hand which goes away after she takes Keppra. She has been complaining of increased frequency of her headaches. She described both migraine headaches as well as muscle tension headaches and these occur on a daily basis. The headache is 8/10 in severity, throbbing, accompanied by nausea,  vomiting light and sound sensitivity. She is prescribed Tylenol which didn't work and subsequently Ultram which also is has not been quite effective. She has also been complaining of feeling depressed, low mood, emotional lability and poor short-term memory. She has a prior history of depression and had been on Prozac 40 mg daily for years. She is concerned about not having had a menstrual period since her pregnancy and has questions about birth control but she does have an upcoming appointment with her gynecologist next week. Prior to her pregnancy she has no prior history of deep vein thrombosis, pulmonary embolism, recurrent miscarriages or any other blood clots. ROS:   14 system review of systems is positive for  headache, memory loss, numbness, confusion, weakness, dizziness, lack of sleep, depression, emotional lability and all other systems negative  PMH:  Past Medical History  Diagnosis Date  . Interstitial cystitis   . Chronic pelvic pain in female   . Headache(784.0)   . Infection     UTI  . Pyelonephritis affecting pregnancy in first trimester July 2015  . Depression 2007  . Substance abuse   . Pneumonia 03/2014  . Hemiparesis ~ 05/15/2014    RUE/RLE "worse in my arm"  . Seizures   . Stroke   . Numbness     right side  . Dizziness 07/18/14  . Confusion     Social History:  History   Social History  . Marital Status: Single    Spouse Name: N/A  . Number of Children: 2  . Years of Education: 9   Occupational History  . homemaker    Social History  Main Topics  . Smoking status: Current Every Day Smoker -- 0.50 packs/day for 8 years    Types: Cigarettes  . Smokeless tobacco: Never Used     Comment: cutting back  . Alcohol Use: No  . Drug Use: No     Comment: Pt on Methadone "was on pain pills; weaned off while I was pregnant so it would be safer"  . Sexual Activity: Not Currently    Birth Control/ Protection: None   Other Topics Concern  . Not on file   Social  History Narrative   Has significant other   Right handed   Caffeine use - none    Medications:   Current Outpatient Prescriptions on File Prior to Visit  Medication Sig Dispense Refill  . apixaban (ELIQUIS) 5 MG TABS tablet Take 1 tablet (5 mg total) by mouth 2 (two) times daily. 60 tablet 3  . EPINEPHrine 0.3 mg/0.3 mL IJ SOAJ injection Inject 0.3 mLs (0.3 mg total) into the muscle once. 1 Device 1  . levETIRAcetam (KEPPRA) 500 MG tablet Take 1 tablet (500 mg total) by mouth 2 (two) times daily. 60 tablet 3  . magnesium hydroxide (MILK OF MAGNESIA) 400 MG/5ML suspension Take 15 mLs by mouth at bedtime. 360 mL 0  . methadone (DOLOPHINE) 10 MG/5ML solution Take 72 mg by mouth daily.      No current facility-administered medications on file prior to visit.    Allergies:   Allergies  Allergen Reactions  . Bee Venom Swelling  . Ativan [Lorazepam] Other (See Comments)    " I don't remember anything."  . Ciprofloxacin Diarrhea and Nausea And Vomiting    Physical Exam General: well developed, well nourished young Caucasian lady, seated, in no evident distress Head: head normocephalic and atraumatic.  Neck: supple with no carotid or supraclavicular bruits Cardiovascular: regular rate and rhythm, no murmurs Musculoskeletal: no deformity Skin:  no rash/petichiae Vascular:  Normal pulses all extremities Filed Vitals:   07/18/14 1146  BP: 125/65  Pulse: 81   Neurologic Exam Mental Status: Awake and fully alert. Oriented to place and time. Recent and remote memory intact. Attention span, concentration and fund of knowledge appropriate. Mood and affect labile and burst into tears briefly during this visit.. Geriatric depression scale scored 5 suggestive of mild depression Cranial Nerves: Fundoscopic exam reveals sharp disc margins. Pupils equal, briskly reactive to light. Extraocular movements full without nystagmus. Visual fields full to confrontation. Hearing intact. Facial sensation  intact. Face, tongue, palate moves normally and symmetrically.  Motor: Normal bulk and tone. Normal strength in all tested extremity muscles. Sensory.: intact to touch ,pinprick .position and vibratory sensation. Except for mild subjective decreased sensation along the medial aspect of the right hand. Coordination: Rapid alternating movements normal in all extremities. Finger-to-nose and heel-to-shin performed accurately bilaterally. Gait and Station: Arises from chair without difficulty. Stance is normal. Gait demonstrates normal stride length and balance . Able to heel, toe and tandem walk without difficulty.  Reflexes: 1+ and symmetric. Toes downgoing.      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 Also increase frequency of migraines with mixed tension headaches likely due to stress.    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 Recommend she continue eliquis for 6-9 months. She is having significant migraine headaches with increased frequency related to underlying stress and depression. Trial of Relpax 40 mg for symptomatic relief and may repeat once in 2 hours and maximum 3 days per week. I have also advised her to increase participation in activities for stress relaxation like regular exercise, swimming, meditation and yoga Continue Keppra and the current dose of 500 mg twice daily for partial seizures. Start Prozac 20 mg daily for her depression. I have advised her to follow-up with her gynecologist to discuss her menstrual cycle as well as need for contraception but recommend she avoid birth control pills.  Plan to repeat MRI scan of the brain and MRV prior to next follow-up visit in 3 months.  Antony Contras, MD   Note: This document was prepared with digital dictation and possible smart phrase technology. Any transcriptional errors that result from this process are unintentional

## 2014-07-18 NOTE — Patient Instructions (Addendum)
I had a long discussion with the patient and her husband regarding her cerebral venous sinus thrombosis related to   pregnancy related hypercoagulopathy. Recommend she continue eliquis for 6-9 months. She is having significant migraine headaches with increased frequency related to underlying stress and depression. Trial of Relpax 40 mg for symptomatic relief and may repeat once in 2 hours and maximum 3 days per week. I have also advised her to increase participation in activities for stress relaxation like regular exercise, swimming, meditation and yoga Continue Keppra and the current dose of 500 mg twice daily for partial seizures. Start Prozac 20 mg daily for her depression. I have advised her to follow-up with her gynecologist to discuss her menstrual cycle as well as need for contraception but recommend she avoid birth control pills. Plan to repeat MRI scan of the brain and MRV prior to next follow-up visit in 3 months.

## 2014-07-20 ENCOUNTER — Ambulatory Visit: Payer: Medicaid Other | Admitting: Family

## 2014-07-20 ENCOUNTER — Telehealth: Payer: Self-pay | Admitting: *Deleted

## 2014-07-20 NOTE — Telephone Encounter (Signed)
Haidyn left a message she needs someone to call her. States she has an appointment Monday, but she is on elequist ( blood thinner) and just started her period. Wants to know if there is anything special she needs to know, is her period going to be heavy- this is her first period since baby born and on elequist.  Discussed with [place], CNM and called Kenlynn. We discussed elequist is a newer drug, but in general the first period after childbirth can be a heavier period than your average period; as well as on any blood thinner can have heavier periods than what you were used to. I advised her if she saturates a pad in one hour or less for several hours- that we consider that too heavy and want her to come to [facility] at anytime for evaluation.  If bleeding not that heavy, but still changing pad often and period lasts for longer than usual and she starts feeling dizzy or weak- could indicate her hemoglobin has fallen and needs to come to [facility]U for evaluation. Morning voices understanding and will keep her appointment for MOnday.

## 2014-07-25 ENCOUNTER — Ambulatory Visit: Payer: Medicaid Other | Admitting: Family Medicine

## 2014-07-27 NOTE — Telephone Encounter (Signed)
Pt was inform of results

## 2014-08-01 ENCOUNTER — Other Ambulatory Visit: Payer: Self-pay | Admitting: Obstetrics & Gynecology

## 2014-08-03 ENCOUNTER — Ambulatory Visit: Payer: Medicaid Other

## 2014-08-05 ENCOUNTER — Emergency Department (HOSPITAL_COMMUNITY)
Admission: EM | Admit: 2014-08-05 | Discharge: 2014-08-06 | Disposition: A | Discharge status: Home / Self Care | Payer: Medicaid Other | Attending: Emergency Medicine | Admitting: Emergency Medicine

## 2014-08-05 ENCOUNTER — Emergency Department (HOSPITAL_COMMUNITY): Payer: Medicaid Other

## 2014-08-05 ENCOUNTER — Encounter (HOSPITAL_COMMUNITY): Payer: Self-pay | Admitting: Emergency Medicine

## 2014-08-05 DIAGNOSIS — R519 Headache, unspecified: Secondary | ICD-10-CM

## 2014-08-05 DIAGNOSIS — R200 Anesthesia of skin: Secondary | ICD-10-CM | POA: Diagnosis not present

## 2014-08-05 DIAGNOSIS — F329 Major depressive disorder, single episode, unspecified: Secondary | ICD-10-CM | POA: Insufficient documentation

## 2014-08-05 DIAGNOSIS — Z87440 Personal history of urinary (tract) infections: Secondary | ICD-10-CM | POA: Diagnosis not present

## 2014-08-05 DIAGNOSIS — I8290 Acute embolism and thrombosis of unspecified vein: Secondary | ICD-10-CM

## 2014-08-05 DIAGNOSIS — G8190 Hemiplegia, unspecified affecting unspecified side: Secondary | ICD-10-CM | POA: Diagnosis not present

## 2014-08-05 DIAGNOSIS — Z79899 Other long term (current) drug therapy: Secondary | ICD-10-CM | POA: Insufficient documentation

## 2014-08-05 DIAGNOSIS — Z8701 Personal history of pneumonia (recurrent): Secondary | ICD-10-CM | POA: Diagnosis not present

## 2014-08-05 DIAGNOSIS — I676 Nonpyogenic thrombosis of intracranial venous system: Secondary | ICD-10-CM

## 2014-08-05 DIAGNOSIS — R51 Headache: Secondary | ICD-10-CM | POA: Diagnosis present

## 2014-08-05 DIAGNOSIS — Z8673 Personal history of transient ischemic attack (TIA), and cerebral infarction without residual deficits: Secondary | ICD-10-CM | POA: Diagnosis not present

## 2014-08-05 DIAGNOSIS — Z87448 Personal history of other diseases of urinary system: Secondary | ICD-10-CM | POA: Insufficient documentation

## 2014-08-05 DIAGNOSIS — K5900 Constipation, unspecified: Secondary | ICD-10-CM | POA: Diagnosis not present

## 2014-08-05 DIAGNOSIS — Z720 Tobacco use: Secondary | ICD-10-CM | POA: Diagnosis not present

## 2014-08-05 LAB — I-STAT CHEM 8, ED
BUN: 19 mg/dL (ref 6–20)
Calcium, Ion: 1.22 mmol/L (ref 1.12–1.23)
Chloride: 106 mmol/L (ref 101–111)
Creatinine, Ser: 0.8 mg/dL (ref 0.44–1.00)
Glucose, Bld: 83 mg/dL (ref 65–99)
HCT: 34 % — ABNORMAL LOW (ref 36.0–46.0)
Hemoglobin: 11.6 g/dL — ABNORMAL LOW (ref 12.0–15.0)
Potassium: 3.6 mmol/L (ref 3.5–5.1)
Sodium: 141 mmol/L (ref 135–145)
TCO2: 19 mmol/L (ref 0–100)

## 2014-08-05 LAB — CBC
HCT: 32.4 % — ABNORMAL LOW (ref 36.0–46.0)
Hemoglobin: 9.8 g/dL — ABNORMAL LOW (ref 12.0–15.0)
MCH: 21 pg — ABNORMAL LOW (ref 26.0–34.0)
MCHC: 30.2 g/dL (ref 30.0–36.0)
MCV: 69.5 fL — ABNORMAL LOW (ref 78.0–100.0)
Platelets: 282 10*3/uL (ref 150–400)
RBC: 4.66 MIL/uL (ref 3.87–5.11)
RDW: 18.9 % — ABNORMAL HIGH (ref 11.5–15.5)
WBC: 5.3 10*3/uL (ref 4.0–10.5)

## 2014-08-05 LAB — DIFFERENTIAL
Basophils Absolute: 0.1 10*3/uL (ref 0.0–0.1)
Basophils Relative: 1 % (ref 0–1)
Eosinophils Absolute: 0.1 10*3/uL (ref 0.0–0.7)
Eosinophils Relative: 2 % (ref 0–5)
Lymphocytes Relative: 53 % — ABNORMAL HIGH (ref 12–46)
Lymphs Abs: 2.7 10*3/uL (ref 0.7–4.0)
Monocytes Absolute: 0.3 10*3/uL (ref 0.1–1.0)
Monocytes Relative: 5 % (ref 3–12)
Neutro Abs: 2.1 10*3/uL (ref 1.7–7.7)
Neutrophils Relative %: 39 % — ABNORMAL LOW (ref 43–77)

## 2014-08-05 LAB — COMPREHENSIVE METABOLIC PANEL
ALT: 37 U/L (ref 14–54)
AST: 30 U/L (ref 15–41)
Albumin: 4 g/dL (ref 3.5–5.0)
Alkaline Phosphatase: 62 U/L (ref 38–126)
Anion gap: 11 (ref 5–15)
BUN: 17 mg/dL (ref 6–20)
CO2: 22 mmol/L (ref 22–32)
Calcium: 9.6 mg/dL (ref 8.9–10.3)
Chloride: 106 mmol/L (ref 101–111)
Creatinine, Ser: 0.72 mg/dL (ref 0.44–1.00)
GFR calc Af Amer: >60 mL/min (ref >60)
GFR calc non Af Amer: >60 mL/min (ref >60)
Glucose, Bld: 86 mg/dL (ref 65–99)
Potassium: 3.7 mmol/L (ref 3.5–5.1)
Sodium: 139 mmol/L (ref 135–145)
Total Bilirubin: 0.2 mg/dL — ABNORMAL LOW (ref 0.3–1.2)
Total Protein: 6.9 g/dL (ref 6.5–8.1)

## 2014-08-05 LAB — I-STAT TROPONIN, ED: Troponin i, poc: 0 ng/mL (ref 0.00–0.08)

## 2014-08-05 LAB — APTT: aPTT: 29 seconds (ref 24–37)

## 2014-08-05 LAB — CBG MONITORING, ED: Glucose-Capillary: 87 mg/dL (ref 65–99)

## 2014-08-05 LAB — PROTIME-INR
INR: 1.16 (ref 0.00–1.49)
Prothrombin Time: 15 seconds (ref 11.6–15.2)

## 2014-08-05 MED ORDER — LORAZEPAM 1 MG PO TABS
0.5000 mg | ORAL_TABLET | ORAL | Status: DC | PRN
  Administered 2014-08-05: 0.5 mg via ORAL
  Filled 2014-08-05: qty 1

## 2014-08-05 NOTE — ED Notes (Signed)
Patient transported to CT 

## 2014-08-05 NOTE — Consult Note (Signed)
Referring Physician: Ashok Cordia    Chief Complaint: Headache, right sided numbness  HPI: Victoria Alvarez is an 24 y.o. 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  Patient initially placed on Coumadin but had difficulties remaining therapeutic and had extension of her thrombus.  Patient was changed to Eliquis and has been compliant.  Reports awakening at 0300 with right arm tingling and right leg weakness.  Did not want to come to the hospital so she attempted to wait it out.  Developed a hedadche as well that she now rates at 8/10.  Her presenting symptoms in the past have been right sided numbness and headache.  With no resolution of symptoms patient presented for evaluation.  Code stroke called in triage.  NIHSS of 2.   Date last known well: Date: 08/04/2014 Time last known well: Time: 22:00 tPA Given: No: On Eliquis, outside time window  Past Medical History  Diagnosis Date  . Interstitial cystitis   . Chronic pelvic pain in female   . Headache(784.0)   . Infection     UTI  . Pyelonephritis affecting pregnancy in first trimester July 2015  . Depression 2007  . Substance abuse   . Pneumonia 03/2014  . Hemiparesis ~ 05/15/2014    RUE/RLE "worse in my arm"  . Seizures   . Stroke   . Numbness     right side  . Dizziness 07/18/14  . Confusion     Past Surgical History  Procedure Laterality Date  . Vagina surgery  ~ 2011    tumor removed, went to cancer center in [facility]  . Cesarean section N/A 05/06/2014    Procedure: CESAREAN SECTION;  Surgeon: Truett Mainland, DO;  Location: [facility];  Service: Obstetrics;  Laterality: N/A;  . Inguinal hernia repair Left ~ 2014    Family History  Problem Relation Age of Onset  . Diabetes Mother   . Heart disease Mother   . Hypertension Father   . Diabetes Maternal Grandmother   . Hypertension Maternal Grandmother   .  Stroke Maternal Grandmother   . Diabetes Maternal Grandfather   . Hypertension Maternal Grandfather   . Diabetes Paternal Grandmother   . Diabetes Paternal Grandfather    Social History:  reports that she has been smoking Cigarettes.  She has a 4 pack-year smoking history. She has never used smokeless tobacco. She reports that she does not drink alcohol or use illicit drugs.  Allergies:  Allergies  Allergen Reactions  . Bee Venom Swelling  . Ativan [Lorazepam] Other (See Comments)    " I don't remember anything."  . Ciprofloxacin Diarrhea and Nausea And Vomiting    Medications: I have reviewed the patient's current medications. Prior to Admission:  Current outpatient prescriptions:  .  apixaban (ELIQUIS) 5 MG TABS tablet, Take 1 tablet (5 mg total) by mouth 2 (two) times daily., Disp: 60 tablet, Rfl: 3 .  eletriptan (RELPAX) 40 MG tablet, Take 1 tablet (40 mg total) by mouth as needed for migraine or headache. May repeat in 2 hours if headache persists or recurs., Disp: 10 tablet, Rfl: 0 .  EPINEPHrine 0.3 mg/0.3 mL IJ SOAJ injection, Inject 0.3 mLs (0.3 mg total) into the muscle once., Disp: 1 Device, Rfl: 1 .  FLUoxetine (PROZAC) 20 MG capsule, Take 1 capsule (20 mg total) by mouth daily., Disp: 30 capsule, Rfl: 3 .  levETIRAcetam (KEPPRA) 500  MG tablet, Take 1 tablet (500 mg total) by mouth 2 (two) times daily., Disp: 60 tablet, Rfl: 3 .  magnesium hydroxide (MILK OF MAGNESIA) 400 MG/5ML suspension, Take 15 mLs by mouth at bedtime., Disp: 360 mL, Rfl: 0 .  methadone (DOLOPHINE) 10 MG/5ML solution, Take 72 mg by mouth daily. , Disp: , Rfl:    ROS: History obtained from the patient  General ROS: negative for - chills, fatigue, fever, night sweats, weight gain or weight loss Psychological ROS: negative for - behavioral disorder, hallucinations, memory difficulties, mood swings or suicidal ideation Ophthalmic ROS: negative for - blurry vision, double vision, eye pain or loss of  vision ENT ROS: negative for - epistaxis, nasal discharge, oral lesions, sore throat, tinnitus or vertigo Allergy and Immunology ROS: negative for - hives or itchy/watery eyes Hematological and Lymphatic ROS: negative for - bleeding problems, bruising or swollen lymph nodes Endocrine ROS: negative for - galactorrhea, hair pattern changes, polydipsia/polyuria or temperature intolerance Respiratory ROS: negative for - cough, hemoptysis, shortness of breath or wheezing Cardiovascular ROS: negative for - chest pain, dyspnea on exertion, edema or irregular heartbeat Gastrointestinal ROS: abdominal pain Genito-Urinary ROS: negative for - dysuria, hematuria, incontinence or urinary frequency/urgency Musculoskeletal ROS: negative for - joint swelling or muscular weakness Neurological ROS: as noted in HPI Dermatological ROS: negative for rash and skin lesion changes  Physical Examination: Blood pressure 123/50, pulse 85, temperature 98 F (36.7 C), temperature source Oral, resp. rate 17, weight 77.111 kg (170 lb), SpO2 98 %, unknown if currently breastfeeding.  HEENT-  Normocephalic, no lesions, without obvious abnormality.  Normal external eye and conjunctiva.  Normal TM's bilaterally.  Normal auditory canals and external ears. Normal external nose, mucus membranes and septum.  Normal pharynx. Cardiovascular- S1, S2 normal, pulses palpable throughout   Lungs- chest clear, no wheezing, rales, normal symmetric air entry Abdomen- soft, non-tender; bowel sounds normal; no masses,  no organomegaly Extremities- no edema Lymph-no adenopathy palpable Musculoskeletal-no joint tenderness, deformity or swelling Skin-warm and dry, no hyperpigmentation, vitiligo, or suspicious lesions  Neurological Examination Mental Status: Alert, oriented, thought content appropriate.  Tearful.  Speech fluent without evidence of aphasia.  Able to follow 3 step commands without difficulty. Cranial Nerves: II: Discs flat  bilaterally; Visual fields grossly normal, pupils equal, round, reactive to light and accommodation III,IV, VI: ptosis not present, extra-ocular motions intact bilaterally V,VII: smile symmetric, facial light touch sensation normal bilaterally VIII: hearing normal bilaterally IX,X: gag reflex present XI: bilateral shoulder shrug XII: midline tongue extension Motor: Right : Upper extremity   5/5    Left:     Upper extremity   5/5  Lower extremity   4+/5    Lower extremity   5/5 Tone and bulk:normal tone throughout; no atrophy noted Sensory: Pinprick and light touch decreased in the RUE Deep Tendon Reflexes: 2+ and symmetric throughout Plantars: Right: downgoing   Left: downgoing Cerebellar: normal finger-to-nose and normal heel-to-shin testing bilaterally Gait: not tested due to safety concerns      Laboratory Studies:  Basic Metabolic Panel:  Recent Labs Lab 08/05/14 2018 08/05/14 2024  NA 139 141  K 3.7 3.6  CL 106 106  CO2 22  --   GLUCOSE 86 83  BUN 17 19  CREATININE 0.72 0.80  CALCIUM 9.6  --     Liver Function Tests:  Recent Labs Lab 08/05/14 2018  AST 30  ALT 37  ALKPHOS 62  BILITOT 0.2*  PROT 6.9  ALBUMIN 4.0  No results for input(s): LIPASE, AMYLASE in the last 168 hours. No results for input(s): AMMONIA in the last 168 hours.  CBC:  Recent Labs Lab 08/05/14 2018 08/05/14 2024  WBC 5.3  --   NEUTROABS 2.1  --   HGB 9.8* 11.6*  HCT 32.4* 34.0*  MCV 69.5*  --   PLT 282  --     Cardiac Enzymes: No results for input(s): CKTOTAL, CKMB, CKMBINDEX, TROPONINI in the last 168 hours.  BNP: Invalid input(s): POCBNP  CBG:  Recent Labs Lab 08/05/14 2018  GLUCAP 62    Microbiology: Results for orders placed or performed during the hospital encounter of 06/11/14  MRSA PCR Screening     Status: None   Collection Time: 06/12/14  4:45 AM  Result Value Ref Range Status   MRSA by PCR NEGATIVE NEGATIVE Final    Comment:        The GeneXpert  MRSA Assay (FDA approved for NASAL specimens only), is one component of a comprehensive MRSA colonization surveillance program. It is not intended to diagnose MRSA infection nor to guide or monitor treatment for MRSA infections.     Coagulation Studies:  Recent Labs  08/05/14 2018  LABPROT 15.0  INR 1.16    Urinalysis: No results for input(s): COLORURINE, LABSPEC, PHURINE, GLUCOSEU, HGBUR, BILIRUBINUR, KETONESUR, PROTEINUR, UROBILINOGEN, NITRITE, LEUKOCYTESUR in the last 168 hours.  Invalid input(s): APPERANCEUR  Lipid Panel: No results found for: CHOL, TRIG, HDL, CHOLHDL, VLDL, LDLCALC  HgbA1C: No results found for: HGBA1C  Urine Drug Screen:      Component Value Date/Time   LABOPIA NONE DETECTED 06/11/2014 2223   LABOPIA NEG 03/01/2014 1006   COCAINSCRNUR NONE DETECTED 06/11/2014 2223   COCAINSCRNUR NEG 03/01/2014 1006   LABBENZ NONE DETECTED 06/11/2014 2223   LABBENZ NEG 03/01/2014 1006   AMPHETMU NONE DETECTED 06/11/2014 2223   AMPHETMU NEG 03/01/2014 1006   THCU NONE DETECTED 06/11/2014 2223   THCU PPS 03/01/2014 1006   THCU 42* 03/01/2014 1006   LABBARB NONE DETECTED 06/11/2014 2223   LABBARB NEG 03/01/2014 1006    Alcohol Level: No results for input(s): ETH in the last 168 hours.  Other results: EKG: sinus rhythm at 89 bpm.  Imaging: Ct Head (brain) Wo Contrast  08/05/2014   CLINICAL DATA:  Right arm numbness starting this morning, headache starting 6 p.m.  EXAM: CT HEAD WITHOUT CONTRAST  TECHNIQUE: Contiguous axial images were obtained from the base of the skull through the vertex without intravenous contrast.  COMPARISON:  06/11/2014  FINDINGS: No intracranial hemorrhage, mass effect or midline shift. Ventricular size is stable from prior exam. No acute cortical infarction. No mass lesion is noted on this unenhanced scan. The gray and white-matter differentiation is preserved. No intraventricular hemorrhage. Paranasal sinuses and mastoid air cells are  unremarkable.  IMPRESSION: No acute cortical infarction. No intraventricular hemorrhage. No significant change.   Electronically Signed   By: Lahoma Crocker M.D.   On: 08/05/2014 20:39    Assessment: 24 y.o. female presenting with headache, right upper extremity numbness and right lower extremity weakness.  NIHSS of 2.  Patient with a history of left parietal cortical venous infarct and superior sagittal sinus thrombosis, On Eliquis and compliant.  Head CT personally reviewed and shows no acute changes.  Patient's presentations in the past have been similar.  Will rule out extension of thrombosis.    Stroke Risk Factors - none  Plan: 1. MRI, MRV of the brain 2. Prophylactic therapy-Continue Eliquis 3.  NPO until RN stroke swallow screen 4. Telemetry monitoring 5. Frequent neuro checks  Case discussed with Dr. Herbie Saxon, MD [facility] [phone] 08/05/2014, 9:18 PM  Addendum: MRI, MRV show improvement in thrombosis.  No acute infarcts noted.    Recommendations: 1.  [place], MD [facility] [phone]

## 2014-08-05 NOTE — ED Notes (Addendum)
Pt presents to triage via POV.  Reports sudden onset of headache at 6:20pm.  Reports while on the way to hospital R hand started feeling numb.  Pt anxious.  States she is concerned because the last time she came to ED she had a seizure 5 min after arriving.  Pt states she is currently on Eliquis.  Pt reports no history of headaches but PMH in chart shows history of headaches.  Code Stroke Activated and pt straight to bridge for EDP assessment.

## 2014-08-05 NOTE — ED Provider Notes (Signed)
CSN: [id number]     Arrival date & time 08/05/14  2002 History   First MD Initiated Contact with Patient 08/05/14 2014     Chief Complaint  Patient presents with  . Code Stroke   Victoria Alvarez is a 24 y.o. 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 The patient reports that she was last normal at 10 PM last night. The patient reports that she woke up at 3 AM with right hand numbness And right leg numbness.. The patient says that she tried to "Wait it out" however, the patient began having a headache at 5 PM prompting her to come to the emergency department for evaluation. The patient reports that she had similar symptoms prior to her stroke. The patient denies any fevers, chills, chest pain, shortness of breath, nausea, vomiting,  diarrhea, dysuria. The patient does report having mild to moderate abdominal pain in the setting of constipation which he has had for around one week. The patient says that she is currently taking stool softeners which have greatly improved her bowel movements. The patient was made a code stroke on arrival nursing protocols and the patient was quickly taken to the CT scanner.    An emergency department physician performed an initial assessment on this suspected stroke patient at 2014. (Consider location/radiation/quality/duration/timing/severity/associated sxs/prior Treatment) Patient is a 24 y.o. female presenting with neurologic complaint. The history is provided by the patient, medical records and the spouse. No language interpreter was used.  Neurologic Problem This is a recurrent problem. The current episode started today. The problem occurs constantly. The problem has been unchanged. Associated symptoms include abdominal pain, headaches and numbness. Pertinent negatives include no chest pain, chills, congestion, coughing, diaphoresis, fatigue,  fever, nausea, neck pain, rash, vertigo, visual change, vomiting or weakness. Nothing aggravates the symptoms. She has tried nothing for the symptoms. The treatment provided no relief.    Past Medical History  Diagnosis Date  . Interstitial cystitis   . Chronic pelvic pain in female   . Headache(784.0)   . Infection     UTI  . Pyelonephritis affecting pregnancy in first trimester July 2015  . Depression 2007  . Substance abuse   . Pneumonia 03/2014  . Hemiparesis ~ 05/15/2014    RUE/RLE "worse in my arm"  . Seizures   . Stroke   . Numbness     right side  . Dizziness 07/18/14  . Confusion    Past Surgical History  Procedure Laterality Date  . Vagina surgery  ~ 2011    tumor removed, went to cancer center in [facility]  . Cesarean section N/A 05/06/2014    Procedure: CESAREAN SECTION;  Surgeon: Truett Mainland, DO;  Location: [facility];  Service: Obstetrics;  Laterality: N/A;  . Inguinal hernia repair Left ~ 2014   Family History  Problem Relation Age of Onset  . Diabetes Mother   . Heart disease Mother   . Hypertension Father   . Diabetes Maternal Grandmother   . Hypertension Maternal Grandmother   . Stroke Maternal Grandmother   . Diabetes Maternal Grandfather   . Hypertension Maternal Grandfather   . Diabetes Paternal Grandmother   . Diabetes Paternal Grandfather    History  Substance Use Topics  . Smoking status: Current Every Day Smoker -- 0.50 packs/day for 8 years    Types: Cigarettes  . Smokeless tobacco: Never Used  Comment: cutting back  . Alcohol Use: No   OB History    Gravida Para Term Preterm AB TAB SAB Ectopic Multiple Living   3 2 2  0 1 0 1 0 0 2     Review of Systems  Constitutional: Negative for fever, chills, diaphoresis, appetite change and fatigue.  HENT: Negative for congestion and rhinorrhea.   Eyes: Negative for visual disturbance.  Respiratory: Negative for cough, chest tightness, wheezing and stridor.   Cardiovascular: Negative for chest pain,  palpitations and leg swelling.  Gastrointestinal: Positive for abdominal pain and constipation. Negative for nausea, vomiting, diarrhea and abdominal distention.  Genitourinary: Negative for dysuria.  Musculoskeletal: Negative for back pain, neck pain and neck stiffness.  Skin: Negative for rash and wound.  Neurological: Positive for numbness and headaches. Negative for dizziness, vertigo, seizures and weakness.  Psychiatric/Behavioral: Negative for agitation.  All other systems reviewed and are negative.     Allergies  Bee venom; Ativan; and Ciprofloxacin  Home Medications   Prior to Admission medications   Medication Sig Start Date End Date Taking? Authorizing Provider  apixaban (ELIQUIS) 5 MG TABS tablet Take 1 tablet (5 mg total) by mouth 2 (two) times daily. 06/19/14 09/08/14  Arnoldo Morale, MD  eletriptan (RELPAX) 40 MG tablet Take 1 tablet (40 mg total) by mouth as needed for migraine or headache. May repeat in 2 hours if headache persists or recurs. 07/18/14   Garvin Fila, MD  EPINEPHrine 0.3 mg/0.3 mL IJ SOAJ injection Inject 0.3 mLs (0.3 mg total) into the muscle once. 06/16/14   Arnoldo Morale, MD  FLUoxetine (PROZAC) 20 MG capsule Take 1 capsule (20 mg total) by mouth daily. 07/18/14   Garvin Fila, MD  levETIRAcetam (KEPPRA) 500 MG tablet Take 1 tablet (500 mg total) by mouth 2 (two) times daily. 06/13/14   Reyne Dumas, MD  magnesium hydroxide (MILK OF MAGNESIA) 400 MG/5ML suspension Take 15 mLs by mouth at bedtime. 07/13/14   Josalyn Funches, MD  methadone (DOLOPHINE) 10 MG/5ML solution Take 72 mg by mouth daily.     Historical Provider, MD   BP 123/50 mmHg  Pulse 85  Temp(Src) 98 F (36.7 C) (Oral)  Resp 17  Wt 170 lb (77.111 kg)  SpO2 98% Physical Exam  Constitutional: She is oriented to person, place, and time. She appears well-developed and well-nourished. No distress.  HENT:  Head: Normocephalic and atraumatic.  Mouth/Throat: Oropharynx is clear and moist. No  oropharyngeal exudate.  Eyes: Conjunctivae are normal. Pupils are equal, round, and reactive to light. No scleral icterus.  Neck: Normal range of motion. Neck supple.  Cardiovascular: Normal rate, regular rhythm, normal heart sounds and intact distal pulses.   No murmur heard. Pulmonary/Chest: Effort normal and breath sounds normal. No stridor. No respiratory distress. She has no wheezes. She has no rales. She exhibits no tenderness.  Abdominal: Soft. She exhibits no distension. There is no tenderness. There is no rebound and no guarding.  Musculoskeletal: Normal range of motion. She exhibits no tenderness.  Neurological: She is alert and oriented to person, place, and time. She is not disoriented. She displays normal reflexes. A sensory deficit is present. No cranial nerve deficit. She exhibits normal muscle tone. Coordination and gait normal. GCS eye subscore is 4. GCS verbal subscore is 5. GCS motor subscore is 6.  The patient had slight decrease in sensation in her right hand as compared to the left and the right leg as compared to the left. The patient  had normal strength in her bilateral upper and lower countries. Normal reflexes, normal pulses, and her coordination was intact. The patient had no other abnormalities on her neurological exam.  Skin: Skin is warm. She is not diaphoretic. No erythema. No pallor.  Psychiatric: She has a normal mood and affect.  Nursing note and vitals reviewed.   ED Course  Procedures (including critical care time) Labs Review Labs Reviewed  CBC - Abnormal; Notable for the following:    Hemoglobin 9.8 (*)    HCT 32.4 (*)    MCV 69.5 (*)    MCH 21.0 (*)    RDW 18.9 (*)    All other components within normal limits  DIFFERENTIAL - Abnormal; Notable for the following:    Neutrophils Relative % 39 (*)    Lymphocytes Relative 53 (*)    All other components within normal limits  COMPREHENSIVE METABOLIC PANEL - Abnormal; Notable for the following:    Total  Bilirubin 0.2 (*)    All other components within normal limits  I-STAT CHEM 8, ED - Abnormal; Notable for the following:    Hemoglobin 11.6 (*)    HCT 34.0 (*)    All other components within normal limits  PROTIME-INR  APTT  I-STAT TROPOININ, ED  CBG MONITORING, ED  POC URINE PREG, ED    Imaging Review Ct Head (brain) Wo Contrast  08/05/2014   CLINICAL DATA:  Right arm numbness starting this morning, headache starting 6 p.m.  EXAM: CT HEAD WITHOUT CONTRAST  TECHNIQUE: Contiguous axial images were obtained from the base of the skull through the vertex without intravenous contrast.  COMPARISON:  06/11/2014  FINDINGS: No intracranial hemorrhage, mass effect or midline shift. Ventricular size is stable from prior exam. No acute cortical infarction. No mass lesion is noted on this unenhanced scan. The gray and white-matter differentiation is preserved. No intraventricular hemorrhage. Paranasal sinuses and mastoid air cells are unremarkable.  IMPRESSION: No acute cortical infarction. No intraventricular hemorrhage. No significant change.   Electronically Signed   By: Lahoma Crocker M.D.   On: 08/05/2014 20:39   Mr Brain Wo Contrast  08/05/2014   CLINICAL DATA:  Initial evaluation for sudden onset headache, right upper extremity numbness.  EXAM: MRI HEAD WITHOUT  CONTRAST  MRV HEAD WITH CONTRAST  TECHNIQUE: Multiplanar, multiecho pulse sequences of the brain and surrounding structures were obtained without and with intravenous contrast. Angiographic images of the intracranial venous structures were obtained using MRV technique without intravenous contrast.  CONTRAST:  20 cc of MultiHance  COMPARISON:  Prior CT from earlier the same day.  FINDINGS: Cerebral volume within normal limits. Subcentimeter T1 hypo intense focus within the deep white matter of the left centrum semi ovale is unchanged. No other focal parenchymal subtle abnormality identified. No significant white matter disease.  No mass lesion or  midline shift. No hydrocephalus. No extra-axial fluid collection.  No abnormal foci of restricted diffusion to suggest acute intracranial infarct identified. Gray-white matter differentiation maintained. Normal intravascular flow voids are preserved.  Craniocervical junction normal.  Pituitary gland unremarkable.  No acute abnormality about the orbits.  Paranasal sinuses are well pneumatized and free of fluid. No mastoid effusion. Inner ear structures normal.  Bone marrow signal intensity within normal limits. Scalp soft tissues unremarkable.  Dedicated MRV images are suboptimal due to technique and motion. There is a small amount of residual filling defect within the central aspect of the superior sagittal sinus, which may reflect a small amount of residual thrombus.  Normal flow void may be partially contributing to this as well. Arachnoid granulation noted posteriorly. Previously seen cortical vein thrombosis is no longer seen, now resolved. No cortical venous infarct.  IMPRESSION: 1. Marked interval improvement in previously seen superior sagittal sinus thrombosis, with resolved cortical vein thrombosis. Small persistent linear filling defect within the superior sagittal sinus may reflect a small amount of residual nonocclusive clot. No venous infarct. 2. No other acute intracranial process.   Electronically Signed   By: Jeannine Boga M.D.   On: 08/05/2014 23:37   Mr [name]  08/05/2014   CLINICAL DATA:  Initial evaluation for sudden onset headache, right upper extremity numbness.  EXAM: MRI HEAD WITHOUT  CONTRAST  MRV HEAD WITH CONTRAST  TECHNIQUE: Multiplanar, multiecho pulse sequences of the brain and surrounding structures were obtained without and with intravenous contrast. Angiographic images of the intracranial venous structures were obtained using MRV technique without intravenous contrast.  CONTRAST:  20 cc of MultiHance  COMPARISON:  Prior CT from earlier the same day.  FINDINGS:  Cerebral volume within normal limits. Subcentimeter T1 hypo intense focus within the deep white matter of the left centrum semi ovale is unchanged. No other focal parenchymal subtle abnormality identified. No significant white matter disease.  No mass lesion or midline shift. No hydrocephalus. No extra-axial fluid collection.  No abnormal foci of restricted diffusion to suggest acute intracranial infarct identified. Gray-white matter differentiation maintained. Normal intravascular flow voids are preserved.  Craniocervical junction normal.  Pituitary gland unremarkable.  No acute abnormality about the orbits.  Paranasal sinuses are well pneumatized and free of fluid. No mastoid effusion. Inner ear structures normal.  Bone marrow signal intensity within normal limits. Scalp soft tissues unremarkable.  Dedicated MRV images are suboptimal due to technique and motion. There is a small amount of residual filling defect within the central aspect of the superior sagittal sinus, which may reflect a small amount of residual thrombus. Normal flow void may be partially contributing to this as well. Arachnoid granulation noted posteriorly. Previously seen cortical vein thrombosis is no longer seen, now resolved. No cortical venous infarct.  IMPRESSION: 1. Marked interval improvement in previously seen superior sagittal sinus thrombosis, with resolved cortical vein thrombosis. Small persistent linear filling defect within the superior sagittal sinus may reflect a small amount of residual nonocclusive clot. No venous infarct. 2. No other acute intracranial process.   Electronically Signed   By: Jeannine Boga M.D.   On: 08/05/2014 23:37     EKG Interpretation   Date/Time:  Friday Aug 05 2014 20:11:33 EDT Ventricular Rate:  89 PR Interval:  160 QRS Duration: 85 QT Interval:  366 QTC Calculation: 445 R Axis:   88 Text Interpretation:  Sinus rhythm Baseline wander in lead(s) V1 No  significant change since last  tracing Confirmed by Ashok Cordia  MD, Lennette Bihari  ([id number]) on 08/05/2014 10:09:24 PM      MDM    Kadian Nouri is a 24 y.o. 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 The patient reports that her symptoms are similar to her prior presentation of her sinus thrombosis and stroke. The patient was made a code stroke on arrival and the neurology team was at the bedside quickly. The patient was taken to the CT scan or for initial evaluation after her airway was deemed secure.  The CT scan did not show any evidence of acute  intracranial abnormality. The neurology team requested MRA and MRV to look for changes in her thrombosis. The patient was given a small dose of Ativan to assist her anxiety for the MRI scan. The patient was able to obtain the MRI without complication. The patient also had diagnostic laboratory testing while awaiting her imaging.  The patient's laboratory testing returned unremarkable. The patient's imaging studies showed improvement in her sagittal sinus thrombosis And resolved cortical vein thrombosis. There is no evidence of acute intracranial process otherwise. The patient's EKG showed no change from prior.  The patient was reassured with her workup as directed by the neurology team. The patient felt relieved that her MRIs did not show worsening cloth. The neurology team recommended continuing her anticoagulation and follow up with them. On reassessment, the patient's headache had improved as did her numbness in her hand.  The patient says that she will follow up with her PCP for further management of her intermittent constipation and abdominal discomfort. The patient was given a prescription for pain medicine for her headache and was also given a work note. The patient did not have any other problems while remaining in the emergency department. The patient was given  return precautions for any new or worsening symptoms. The patient voiced understanding of the plan of care and had no other questions, concerns, or complaints. The patient was discharged in good condition with her husband and child.  This patient was seen with Dr. Ashok Cordia, emergency medicine attending.   Final diagnoses:  Acute nonintractable headache, unspecified headache type  Numbness          Antony Blackbird, MD 08/06/14 [id number]  Lajean Saver, MD 08/09/14 1530

## 2014-08-06 MED ORDER — HYDROCODONE-ACETAMINOPHEN 5-325 MG PO TABS: 1.0000 | ORAL_TABLET | ORAL | Status: DC | PRN

## 2014-08-06 NOTE — ED Notes (Signed)
Per Denton Lank, MD pt can eat.

## 2014-08-06 NOTE — Discharge Instructions (Signed)

## 2014-09-08 ENCOUNTER — Ambulatory Visit: Payer: Medicaid Other | Admitting: Family Medicine

## 2014-09-26 ENCOUNTER — Ambulatory Visit: Payer: Medicaid Other | Admitting: Family Medicine

## 2014-09-29 ENCOUNTER — Ambulatory Visit: Payer: Medicaid Other | Admitting: Family Medicine

## 2014-10-06 ENCOUNTER — Emergency Department (HOSPITAL_COMMUNITY)
Admission: EM | Admit: 2014-10-06 | Discharge: 2014-10-06 | Disposition: A | Discharge status: Home / Self Care | Payer: Medicaid Other | Attending: Emergency Medicine | Admitting: Emergency Medicine

## 2014-10-06 ENCOUNTER — Encounter (HOSPITAL_COMMUNITY): Payer: Self-pay | Admitting: Vascular Surgery

## 2014-10-06 ENCOUNTER — Emergency Department (HOSPITAL_COMMUNITY): Payer: Medicaid Other

## 2014-10-06 DIAGNOSIS — R140 Abdominal distension (gaseous): Secondary | ICD-10-CM | POA: Diagnosis not present

## 2014-10-06 DIAGNOSIS — G8929 Other chronic pain: Secondary | ICD-10-CM | POA: Insufficient documentation

## 2014-10-06 DIAGNOSIS — Z8673 Personal history of transient ischemic attack (TIA), and cerebral infarction without residual deficits: Secondary | ICD-10-CM | POA: Diagnosis not present

## 2014-10-06 DIAGNOSIS — R635 Abnormal weight gain: Secondary | ICD-10-CM | POA: Diagnosis not present

## 2014-10-06 DIAGNOSIS — K644 Residual hemorrhoidal skin tags: Secondary | ICD-10-CM | POA: Diagnosis not present

## 2014-10-06 DIAGNOSIS — Z8701 Personal history of pneumonia (recurrent): Secondary | ICD-10-CM | POA: Diagnosis not present

## 2014-10-06 DIAGNOSIS — R197 Diarrhea, unspecified: Secondary | ICD-10-CM | POA: Diagnosis not present

## 2014-10-06 DIAGNOSIS — R61 Generalized hyperhidrosis: Secondary | ICD-10-CM | POA: Diagnosis not present

## 2014-10-06 DIAGNOSIS — R05 Cough: Secondary | ICD-10-CM | POA: Insufficient documentation

## 2014-10-06 DIAGNOSIS — R1013 Epigastric pain: Secondary | ICD-10-CM | POA: Diagnosis present

## 2014-10-06 DIAGNOSIS — G40909 Epilepsy, unspecified, not intractable, without status epilepticus: Secondary | ICD-10-CM | POA: Diagnosis not present

## 2014-10-06 DIAGNOSIS — Z7901 Long term (current) use of anticoagulants: Secondary | ICD-10-CM | POA: Diagnosis not present

## 2014-10-06 DIAGNOSIS — R630 Anorexia: Secondary | ICD-10-CM | POA: Insufficient documentation

## 2014-10-06 DIAGNOSIS — R110 Nausea: Secondary | ICD-10-CM | POA: Insufficient documentation

## 2014-10-06 DIAGNOSIS — K529 Noninfective gastroenteritis and colitis, unspecified: Secondary | ICD-10-CM

## 2014-10-06 DIAGNOSIS — Z720 Tobacco use: Secondary | ICD-10-CM | POA: Insufficient documentation

## 2014-10-06 DIAGNOSIS — R0602 Shortness of breath: Secondary | ICD-10-CM | POA: Diagnosis not present

## 2014-10-06 DIAGNOSIS — Z87440 Personal history of urinary (tract) infections: Secondary | ICD-10-CM | POA: Diagnosis not present

## 2014-10-06 DIAGNOSIS — R109 Unspecified abdominal pain: Secondary | ICD-10-CM

## 2014-10-06 LAB — COMPREHENSIVE METABOLIC PANEL
ALT: 45 U/L (ref 14–54)
AST: 37 U/L (ref 15–41)
Albumin: 3.8 g/dL (ref 3.5–5.0)
Alkaline Phosphatase: 62 U/L (ref 38–126)
Anion gap: 11 (ref 5–15)
BUN: 14 mg/dL (ref 6–20)
CO2: 22 mmol/L (ref 22–32)
Calcium: 9.6 mg/dL (ref 8.9–10.3)
Chloride: 105 mmol/L (ref 101–111)
Creatinine, Ser: 0.69 mg/dL (ref 0.44–1.00)
GFR calc Af Amer: >60 mL/min (ref >60)
GFR calc non Af Amer: >60 mL/min (ref >60)
Glucose, Bld: 88 mg/dL (ref 65–99)
Potassium: 4.2 mmol/L (ref 3.5–5.1)
Sodium: 138 mmol/L (ref 135–145)
Total Bilirubin: 0.4 mg/dL (ref 0.3–1.2)
Total Protein: 6.6 g/dL (ref 6.5–8.1)

## 2014-10-06 LAB — CBC WITH DIFFERENTIAL/PLATELET
Basophils Absolute: 0.1 10*3/uL (ref 0.0–0.1)
Basophils Relative: 1 % (ref 0–1)
Eosinophils Absolute: 0.1 10*3/uL (ref 0.0–0.7)
Eosinophils Relative: 2 % (ref 0–5)
HCT: 31 % — ABNORMAL LOW (ref 36.0–46.0)
Hemoglobin: 9.2 g/dL — ABNORMAL LOW (ref 12.0–15.0)
Lymphocytes Relative: 46 % (ref 12–46)
Lymphs Abs: 2.6 10*3/uL (ref 0.7–4.0)
MCH: 19.2 pg — ABNORMAL LOW (ref 26.0–34.0)
MCHC: 29.7 g/dL — ABNORMAL LOW (ref 30.0–36.0)
MCV: 64.6 fL — ABNORMAL LOW (ref 78.0–100.0)
Monocytes Absolute: 0.4 10*3/uL (ref 0.1–1.0)
Monocytes Relative: 7 % (ref 3–12)
Neutro Abs: 2.5 10*3/uL (ref 1.7–7.7)
Neutrophils Relative %: 44 % (ref 43–77)
Platelets: 226 10*3/uL (ref 150–400)
RBC: 4.8 MIL/uL (ref 3.87–5.11)
RDW: 20.4 % — ABNORMAL HIGH (ref 11.5–15.5)
WBC: 5.7 10*3/uL (ref 4.0–10.5)

## 2014-10-06 LAB — URINALYSIS, ROUTINE W REFLEX MICROSCOPIC
Bilirubin Urine: NEGATIVE
Glucose, UA: NEGATIVE mg/dL
Hgb urine dipstick: NEGATIVE
Ketones, ur: NEGATIVE mg/dL
Nitrite: NEGATIVE
Protein, ur: NEGATIVE mg/dL
Specific Gravity, Urine: 1.03 (ref 1.005–1.030)
Urobilinogen, UA: 0.2 mg/dL (ref 0.0–1.0)
pH: 6 (ref 5.0–8.0)

## 2014-10-06 LAB — URINE MICROSCOPIC-ADD ON

## 2014-10-06 LAB — POC OCCULT BLOOD, ED: Fecal Occult Bld: NEGATIVE

## 2014-10-06 LAB — POC URINE PREG, ED: Preg Test, Ur: NEGATIVE

## 2014-10-06 LAB — LIPASE, BLOOD: Lipase: 30 U/L (ref 22–51)

## 2014-10-06 MED ORDER — SODIUM CHLORIDE 0.9 % IV BOLUS (SEPSIS)
1000.0000 mL | Freq: Once | INTRAVENOUS | Status: AC
  Administered 2014-10-06: 1000 mL via INTRAVENOUS

## 2014-10-06 MED ORDER — DIPHENOXYLATE-ATROPINE 2.5-0.025 MG PO TABS: 1.0000 | ORAL_TABLET | Freq: Four times a day (QID) | ORAL | Status: DC | PRN

## 2014-10-06 MED ORDER — DICYCLOMINE HCL 20 MG PO TABS: 20.0000 mg | ORAL_TABLET | Freq: Two times a day (BID) | ORAL | Status: DC

## 2014-10-06 MED ORDER — MORPHINE SULFATE 4 MG/ML IJ SOLN
4.0000 mg | Freq: Once | INTRAMUSCULAR | Status: AC
  Administered 2014-10-06: 4 mg via INTRAVENOUS
  Filled 2014-10-06: qty 1

## 2014-10-06 MED ORDER — IOHEXOL 300 MG/ML  SOLN: 25.0000 mL | Freq: Once | INTRAMUSCULAR | Status: DC | PRN

## 2014-10-06 MED ORDER — ALBUTEROL SULFATE (2.5 MG/3ML) 0.083% IN NEBU
5.0000 mg | INHALATION_SOLUTION | Freq: Once | RESPIRATORY_TRACT | Status: AC
  Administered 2014-10-06: 5 mg via RESPIRATORY_TRACT
  Filled 2014-10-06: qty 6

## 2014-10-06 MED ORDER — IPRATROPIUM BROMIDE 0.02 % IN SOLN
0.5000 mg | Freq: Once | RESPIRATORY_TRACT | Status: AC
  Administered 2014-10-06: 0.5 mg via RESPIRATORY_TRACT
  Filled 2014-10-06: qty 2.5

## 2014-10-06 MED ORDER — ONDANSETRON HCL 4 MG/2ML IJ SOLN
4.0000 mg | Freq: Once | INTRAMUSCULAR | Status: AC
  Administered 2014-10-06: 4 mg via INTRAVENOUS
  Filled 2014-10-06: qty 2

## 2014-10-06 NOTE — ED Notes (Signed)
Pt reports to the ED for eval of abd pain, diarrhea, and SOB. She has had these symptoms ever since she had a C-section 5 months ago. Had a stroke 4 months ago. Reports she wont eat all day but she will continue to have BMs. PT also reports nausea and decreased PO intake but denies any active vomiting. Pt reports she does not know if she has blood in her stool. Eating, bending over, and lifting objects make the pain worse. Laying back decreases the pain. Pt reports the pain will usually go away after a few minutes but today it has lasted all day. Pt A&Ox4, resp e/u, and skin warm and dry.

## 2014-10-06 NOTE — Discharge Instructions (Signed)
Chronic Diarrhea Diarrhea is frequent loose and watery bowel movements. It can cause you to feel weak and dehydrated. Dehydration can cause you to become tired and thirsty and to have a dry mouth, decreased urination, and dark yellow urine. Diarrhea is a sign of another problem, most often an infection that will not last long. In most cases, diarrhea lasts 2-3 days. Diarrhea that lasts longer than 4 weeks is called long-lasting (chronic) diarrhea. It is important to treat your diarrhea as directed by your health care provider to lessen or prevent future episodes of diarrhea.  CAUSES  There are many causes of chronic diarrhea. The following are some possible causes:   Gastrointestinal infections caused by viruses, bacteria, or parasites.   Food poisoning or food allergies.   Certain medicines, such as antibiotics, chemotherapy, and laxatives.   Artificial sweeteners and fructose.   Digestive disorders, such as celiac disease and inflammatory bowel diseases.   Irritable bowel syndrome.  Some disorders of the pancreas.  Disorders of the thyroid.  Reduced blood flow to the intestines.  Cancer. Sometimes the cause of chronic diarrhea is unknown. RISK FACTORS  Having a severely weakened immune system, such as from HIV or AIDS.   Taking certain types of cancer-fighting drugs (such as with chemotherapy) or other medicines.   Having had a recent organ transplant.   Having a portion of the stomach or small bowel removed.   Traveling to countries where food and water supplies are often contaminated.  SYMPTOMS  In addition to frequent, loose stools, diarrhea may cause:   Cramping.   Abdominal pain.   Nausea.   Fever.  Fatigue.  Urgent need to use the bathroom.  Loss of bowel control. DIAGNOSIS  Your health care provider must take a careful history and perform a physical exam. Tests given are based on your symptoms and history. Tests may include:   Blood or  stool tests. Three or more stool samples may be examined. Stool cultures may be used to test for bacteria or parasites.   X-rays.   A procedure in which a thin tube is inserted into the mouth or rectum (endoscopy). This allows the health care provider to look inside the intestine.  TREATMENT   Treatment is aimed at correcting the cause of the diarrhea when possible.  Diarrhea caused by an infection can often be treated with antibiotic medicines.  Diarrhea not caused by an infection may require you to take long-term medicine or have surgery. Specific treatment should be discussed with your health care provider.  If the cause cannot be determined, treatment aims to relieve symptoms and prevent dehydration. Serious health problems can occur if you do not maintain proper fluid levels. Treatment may include:  Taking an oral rehydration solution (ORS).  Not drinking beverages that contain caffeine (such as tea, coffee, and soft drinks).  Not drinking alcohol.  Maintaining well-balanced nutrition to help you recover faster. HOME CARE INSTRUCTIONS   Drink enough fluids to keep urine clear or pale yellow. Drink 1 cup (8 oz) of fluid for each diarrhea episode. Avoid fluids that contain simple sugars, fruit juices, whole milk products, and sodas. Hydrate with an ORS. You may purchase the ORS or prepare it at home by mixing the following ingredients together:   - tsp (1.7-3  mL) table salt.   tsp (3  mL) baking soda.   tsp (1.7 mL) salt substitute containing potassium chloride.  1 tbsp (20 mL) sugar.  4.2 c (1 L) of water.  Certain foods and beverages may increase the speed at which food moves through the gastrointestinal (GI) tract. These foods and beverages should be avoided. They include:  Caffeinated and alcoholic beverages.  High-fiber foods, such as raw fruits and vegetables, nuts, seeds, and whole grain breads and cereals.  Foods and beverages sweetened with sugar  alcohols, such as xylitol, sorbitol, and mannitol.   Some foods may be well tolerated and may help thicken stool. These include:  Starchy foods, such as rice, toast, pasta, low-sugar cereal, oatmeal, grits, baked potatoes, crackers, and bagels.  Bananas.  Applesauce.  Add probiotic-rich foods to help increase healthy bacteria in the GI tract. These include yogurt and fermented milk products.  Wash your hands well after each diarrhea episode.  Only take over-the-counter or prescription medicines as directed by your health care provider.  Take a warm bath to relieve any burning or pain from frequent diarrhea episodes. SEEK MEDICAL CARE IF:   You are not urinating as often.  Your urine is a dark color.  You become very tired or dizzy.  You have severe pain in the abdomen or rectum.  Your have blood or pus in your stools.  Your stools look black and tarry. SEEK IMMEDIATE MEDICAL CARE IF:   You are unable to keep fluids down.  You have persistent vomiting.  You have blood in your stool.  Your stools are black and tarry.  You do not urinate in 6-8 hours, or there is only a small amount of very dark urine.  You have abdominal pain that increases or localizes.  You have weakness, dizziness, confusion, or lightheadedness.  You have a severe headache.  Your diarrhea gets worse or does not get better.  You have a fever or persistent symptoms for more than 2-3 days.  You have a fever and your symptoms suddenly get worse. MAKE SURE YOU:   Understand these instructions.  Will watch your condition.  Will get help right away if you are not doing well or get worse. Document Released: 05/18/2003 Document Revised: 03/02/2013 Document Reviewed: 08/20/2012 [facility] Patient Information 2015 [place], [place]. This information is not intended to replace advice given to you by your health care provider. Make sure you discuss any questions you have with your health care  provider.  Diet and Irritable Bowel Syndrome  No cure has been found for irritable bowel syndrome (IBS). Many options are available to treat the symptoms. Your caregiver will give you the best treatments available for your symptoms. He or she will also encourage you to manage stress and to make changes to your diet. You need to work with your caregiver and Registered Dietician to find the best combination of medicine, diet, counseling, and support to control your symptoms. The following are some diet suggestions. FOODS THAT MAKE IBS WORSE  Fatty foods, such as [place] fries.  Milk products, such as cheese or ice cream.  Chocolate.  Alcohol.  Caffeine (found in coffee and some sodas).  Carbonated drinks, such as soda. If certain foods cause symptoms, you should eat less of them or stop eating them. FOOD JOURNAL   Keep a journal of the foods that seem to cause distress. Write down:  What you are eating during the day and when.  What problems you are having after eating.  When the symptoms occur in relation to your meals.  What foods always make you feel badly.  Take your notes with you to your caregiver to see if you should stop eating certain  foods. FOODS THAT MAKE IBS BETTER Fiber reduces IBS symptoms, especially constipation, because it makes stools soft, bulky, and easier to pass. Fiber is found in bran, bread, cereal, beans, fruit, and vegetables. Examples of foods with fiber include:  Apples.  Peaches.  Pears.  Berries.  Figs.  Broccoli, raw.  Cabbage.  Carrots.  Raw peas.  Kidney beans.  Lima beans.  Whole-grain bread.  Whole-grain cereal. Add foods with fiber to your diet a little at a time. This will let your body get used to them. Too much fiber at once might cause gas and swelling of your abdomen. This can trigger symptoms in a person with IBS. Caregivers usually recommend a diet with enough fiber to produce soft, painless bowel movements. High fiber  diets may cause gas and bloating. However, these symptoms often go away within a few weeks, as your body adjusts. In many cases, dietary fiber may lessen IBS symptoms, particularly constipation. However, it may not help pain or diarrhea. High fiber diets keep the colon mildly enlarged (distended) with the added fiber. This may help prevent spasms in the colon. Some forms of fiber also keep water in the stool, thereby preventing hard stools that are difficult to pass.  Besides telling you to eat more foods with fiber, your caregiver may also tell you to get more fiber by taking a fiber pill or drinking water mixed with a special high fiber powder. An example of this is a natural fiber laxative containing psyllium seed.  TIPS  Large meals can cause cramping and diarrhea in people with IBS. If this happens to you, try eating 4 or 5 small meals a day, or try eating less at each of your usual 3 meals. It may also help if your meals are low in fat and high in carbohydrates. Examples of carbohydrates are pasta, rice, whole-grain breads and cereals, fruits, and vegetables.  If dairy products cause your symptoms to flare up, you can try eating less of those foods. You might be able to handle yogurt better than other dairy products, because it contains bacteria that helps with digestion. Dairy products are an important source of calcium and other nutrients. If you need to avoid dairy products, be sure to talk with a Registered Dietitian about getting these nutrients through other food sources.  Drink enough water and fluids to keep your urine clear or pale yellow. This is important, especially if you have diarrhea. FOR MORE INFORMATION  International Foundation for Functional Gastrointestinal Disorders: www.iffgd.org  National Digestive Diseases Information Clearinghouse: digestive.StageSync.si Document Released: 05/18/2003 Document Revised: 05/20/2011 Document Reviewed: 05/28/2013 [facility] Patient Information  2015 [place], [place]. This information is not intended to replace advice given to you by your health care provider. Make sure you discuss any questions you have with your health care provider.

## 2014-10-06 NOTE — ED Provider Notes (Signed)
CSN: [id number]     Arrival date & time 10/06/14  1707 History   First MD Initiated Contact with Patient 10/06/14 1936     Chief Complaint  Patient presents with  . Abdominal Pain  . Shortness of Breath     (Consider location/radiation/quality/duration/timing/severity/associated sxs/prior Treatment) HPI   Victoria Alvarez is a 24 year old female, current smoker, with complicated medical history, in brief recent C-section delivery February 2016, methadone use, stroke, seizures, currently on Eliquis and Keppra, who presents with 4 months of diarrhea, nausea, decreased appetite, epigastric pain associated with eating which is followed by immediate diarrhea.  Patient states the diarrhea is persistent and has been an issue since her seizure 06/11/2014.  In early May she was told she had a impaction, and she has done several bowel cleanse is, including neck citrate, milk of magnesia for 2 weeks, MiraLAX.  Although she complains of persistent diarrhea, she does have intermittent formed stools. She complains that although she has decreased intake, she has increased bloating and reports weight gain.  Her abdominal pain is epigastric, occurs with eating, and radiates to both the right and left upper quadrants.  She rates her pain 7 out of 10 after baseline, and when it increases with eating she rates it 10 out of 10.  She denies any associated vomiting, dyspepsia, acid reflux. She denies any blood in her stool.  Her diarrhea watery and brown to yellow.  Chart review shows she has a negative C. difficile test in May. She does not know her most recent antibiotic use.  She denies any travel, camping, sick contacts, or ingestion of soft cheeses/undercooked meats.  When asked about her diet and patient states that she drinks a lot of milk, has lost her appetite for her favorite food - an [employer] roast beef sandwich, and since the end of her pregnancy has been excessively chewing and swallowing gum, which coinsides  with the timing of her bloating and diarrhea.  She is chewing and swallowing roughly 1/2 pack of gum a day. She denies any lactose intolerance.  She has stopped several of her newer medications to see if her diarrhea would improve, but saw no improvement.   She complains of sweats with cough and mild SOB, which is common for her due to smoking, currently smokes a pack a day.  She denies any chest pain, leg pain or swelling.  Denies any dysuria, hematuria, vaginal discharge.    Past Medical History  Diagnosis Date  . Interstitial cystitis   . Chronic pelvic pain in female   . Headache(784.0)   . Infection     UTI  . Pyelonephritis affecting pregnancy in first trimester July 2015  . Depression 2007  . Substance abuse   . Pneumonia 03/2014  . Hemiparesis ~ 05/15/2014    RUE/RLE "worse in my arm"  . Seizures   . Stroke   . Numbness     right side  . Dizziness 07/18/14  . Confusion    Past Surgical History  Procedure Laterality Date  . Vagina surgery  ~ 2011    tumor removed, went to cancer center in [facility]  . Cesarean section N/A 05/06/2014    Procedure: CESAREAN SECTION;  Surgeon: Levie Heritage, DO;  Location: [facility];  Service: Obstetrics;  Laterality: N/A;  . Inguinal hernia repair Left ~ 2014   Family History  Problem Relation Age of Onset  . Diabetes Mother   . Heart disease Mother   . Hypertension Father   .  Diabetes Maternal Grandmother   . Hypertension Maternal Grandmother   . Stroke Maternal Grandmother   . Diabetes Maternal Grandfather   . Hypertension Maternal Grandfather   . Diabetes Paternal Grandmother   . Diabetes Paternal Grandfather    History  Substance Use Topics  . Smoking status: Current Every Day Smoker -- 0.50 packs/day for 8 years    Types: Cigarettes  . Smokeless tobacco: Never Used     Comment: cutting back  . Alcohol Use: No   OB History    Gravida Para Term Preterm AB TAB SAB Ectopic Multiple Living   0 1 0 1 0 0 2     Review of Systems   Constitutional: Positive for diaphoresis and appetite change. Negative for fever, chills and fatigue.  HENT: Negative.   Eyes: Negative.   Respiratory: Positive for cough and shortness of breath. Negative for chest tightness, wheezing and stridor.   Cardiovascular: Negative.   Genitourinary: Negative.   Musculoskeletal: Negative.   Skin: Negative.   Neurological: Negative.   Psychiatric/Behavioral: Negative.     Allergies  Bee venom; Ativan; and Ciprofloxacin  Home Medications   Prior to Admission medications   Medication Sig Start Date End Date Taking? Authorizing Provider  apixaban (ELIQUIS) 5 MG TABS tablet Take 1 tablet (5 mg total) by mouth 2 (two) times daily. 06/19/14 09/08/14  Jaclyn Shaggy, MD  eletriptan (RELPAX) 40 MG tablet Take 1 tablet (40 mg total) by mouth as needed for migraine or headache. May repeat in 2 hours if headache persists or recurs. Patient not taking: Reported on 08/05/2014 07/18/14   Micki Riley, MD  EPINEPHrine 0.3 mg/0.3 mL IJ SOAJ injection Inject 0.3 mLs (0.3 mg total) into the muscle once. 06/16/14   Jaclyn Shaggy, MD  FLUoxetine (PROZAC) 20 MG capsule Take 1 capsule (20 mg total) by mouth daily. Patient not taking: Reported on 08/05/2014 07/18/14   Micki Riley, MD  HYDROcodone-acetaminophen (NORCO/VICODIN) 5-325 MG per tablet Take 1 tablet by mouth every 4 (four) hours as needed. 08/06/14   Theda Belfast, MD  levETIRAcetam (KEPPRA) 500 MG tablet Take 1 tablet (500 mg total) by mouth 2 (two) times daily. 06/13/14   Richarda Overlie, MD  magnesium hydroxide (MILK OF MAGNESIA) 400 MG/5ML suspension Take 15 mLs by mouth at bedtime. Patient taking differently: Take 15 mLs by mouth daily as needed for mild constipation or moderate constipation.  07/13/14   Josalyn Funches, MD   BP 114/87 mmHg  Pulse 80  Temp(Src) 98.5 F (36.9 C) (Oral)  Resp 18  SpO2 99%  LMP 10/03/2014 Physical Exam  Constitutional: She is oriented to person, place, and time. She appears  well-developed and well-nourished. No distress.  HENT:  Head: Normocephalic and atraumatic.  Nose: Nose normal.  Mouth/Throat: Oropharynx is clear and moist. No oropharyngeal exudate.  Eyes: Conjunctivae and EOM are normal. Pupils are equal, round, and reactive to light. Right eye exhibits no discharge. Left eye exhibits no discharge. No scleral icterus.  Neck: Normal range of motion. No JVD present. No tracheal deviation present. No thyromegaly present.  Cardiovascular: Normal rate, regular rhythm, normal heart sounds and intact distal pulses.  Exam reveals no gallop and no friction rub.   No murmur heard. Pulmonary/Chest: Effort normal and breath sounds normal. No accessory muscle usage. No respiratory distress. She has no decreased breath sounds. She has no wheezes. She has no rales. She exhibits no tenderness.  Diffuse rhonchi, intermittent cough   Abdominal: Soft. Bowel  sounds are normal. She exhibits no distension and no mass. There is no tenderness. There is no rigidity, no rebound and no guarding.  Soft, obese, no rebound or guarding, hyperactive bowel sounds 4  Genitourinary: Rectal exam shows external hemorrhoid and tenderness. Rectal exam shows no internal hemorrhoid, no fissure, no mass and anal tone normal. Guaiac negative stool.  Digital rectal exam, soft brown stool in rectal vault  Musculoskeletal: Normal range of motion. She exhibits no edema or tenderness.  Lymphadenopathy:    She has no cervical adenopathy.  Neurological: She is alert and oriented to person, place, and time. She has normal reflexes. No cranial nerve deficit. She exhibits normal muscle tone. Coordination normal.  Skin: Skin is warm and dry. No rash noted. She is not diaphoretic. No erythema. No pallor.  Psychiatric: She has a normal mood and affect. Her behavior is normal. Judgment and thought content normal.  Nursing note and vitals reviewed.   ED Course  Procedures (including critical care time) Labs  Review Labs Reviewed  URINALYSIS, ROUTINE W REFLEX MICROSCOPIC (NOT AT [facility]) - Abnormal; Notable for the following:    Leukocytes, UA TRACE (*)    All other components within normal limits  CULTURE, BLOOD (ROUTINE X 2)  CULTURE, BLOOD (ROUTINE X 2)  LIPASE, BLOOD  COMPREHENSIVE METABOLIC PANEL  URINE MICROSCOPIC-ADD ON  CBC WITH DIFFERENTIAL/PLATELET  POC URINE PREG, ED  POC OCCULT BLOOD, ED    Imaging Review Dg Chest 2 View  10/06/2014   CLINICAL DATA:  Abdominal pain and shortness of breath for 5 months.  EXAM: CHEST  2 VIEW  COMPARISON:  Chest radiograph 06/12/2014  FINDINGS: The heart is normal in size. Mild diffuse bronchial thickening. Pulmonary vasculature is normal. No consolidation, pleural effusion, or pneumothorax. No acute osseous abnormalities are seen.  IMPRESSION: Mild diffuse bronchial thickening, can be seen with asthma or bronchitis.   Electronically Signed   By: Rubye Oaks M.D.   On: 10/06/2014 21:29   Dg Abd 2 Views  10/06/2014   CLINICAL DATA:  Abdominal pain and diarrhea for 5 months.  EXAM: ABDOMEN - 2 VIEW  COMPARISON:  07/13/2014  FINDINGS: There is no free intra-abdominal air. No dilated bowel loops to suggest obstruction. Moderate volume of stool throughout the colon. No radiopaque calculi. Pelvic phleboliths and surgical clips projecting over the left inguinal region, unchanged. There is chronic widening of the pubic symphysis. No acute osseous abnormalities are seen.  IMPRESSION: Normal abdominal radiographs with moderate volume of colonic stool.   Electronically Signed   By: Rubye Oaks M.D.   On: 10/06/2014 21:32     EKG Interpretation   Date/Time:  Thursday October 06 2014 20:42:10 EDT Ventricular Rate:  78 PR Interval:    QRS Duration: 82 QT Interval:  390 QTC Calculation: 444 R Axis:   65 Text Interpretation:  Sinus rhythm Artifact No significant change since  last tracing Confirmed by KNAPP  MD-J, JON ([id number]) on 10/06/2014 8:51:01 PM       MDM   Final diagnoses:  Abdominal pain  Abdominal pain    Functional diarrhea - x 4 months - multiple work ups, without any significant change or complication to reported symptoms.  Workup reveals normal chemistry and microcytic anemia Acute abdomen films reveal mild bronchial thickening, which is consistent with her smoking history and chronic cough, and abdominal film reveals moderate volume of stool without any obstruction or perforation     Danelle Berry, PA-C 10/11/14 0735  Linwood Dibbles, MD  10/17/14 0700 

## 2014-10-07 ENCOUNTER — Emergency Department (HOSPITAL_COMMUNITY)
Admission: EM | Admit: 2014-10-07 | Discharge: 2014-10-07 | Discharge status: Against Medical Advice | Payer: Medicaid Other | Source: Home / Self Care

## 2014-10-07 ENCOUNTER — Encounter (HOSPITAL_COMMUNITY): Payer: Self-pay | Admitting: Emergency Medicine

## 2014-10-07 ENCOUNTER — Inpatient Hospital Stay (HOSPITAL_COMMUNITY)
Admission: EM | Admit: 2014-10-07 | Discharge: 2014-10-16 | DRG: 442 | Disposition: A | Discharge status: Home / Self Care | Payer: Medicaid Other | Attending: Internal Medicine | Admitting: Internal Medicine

## 2014-10-07 DIAGNOSIS — K6389 Other specified diseases of intestine: Secondary | ICD-10-CM | POA: Diagnosis present

## 2014-10-07 DIAGNOSIS — Z888 Allergy status to other drugs, medicaments and biological substances status: Secondary | ICD-10-CM

## 2014-10-07 DIAGNOSIS — E669 Obesity, unspecified: Secondary | ICD-10-CM | POA: Diagnosis present

## 2014-10-07 DIAGNOSIS — R1084 Generalized abdominal pain: Secondary | ICD-10-CM | POA: Insufficient documentation

## 2014-10-07 DIAGNOSIS — D649 Anemia, unspecified: Secondary | ICD-10-CM | POA: Diagnosis present

## 2014-10-07 DIAGNOSIS — Z7901 Long term (current) use of anticoagulants: Secondary | ICD-10-CM

## 2014-10-07 DIAGNOSIS — F17210 Nicotine dependence, cigarettes, uncomplicated: Secondary | ICD-10-CM | POA: Diagnosis present

## 2014-10-07 DIAGNOSIS — D6859 Other primary thrombophilia: Secondary | ICD-10-CM | POA: Diagnosis present

## 2014-10-07 DIAGNOSIS — D509 Iron deficiency anemia, unspecified: Secondary | ICD-10-CM | POA: Diagnosis present

## 2014-10-07 DIAGNOSIS — Z881 Allergy status to other antibiotic agents status: Secondary | ICD-10-CM

## 2014-10-07 DIAGNOSIS — Z720 Tobacco use: Secondary | ICD-10-CM | POA: Insufficient documentation

## 2014-10-07 DIAGNOSIS — G40909 Epilepsy, unspecified, not intractable, without status epilepticus: Secondary | ICD-10-CM | POA: Diagnosis present

## 2014-10-07 DIAGNOSIS — K921 Melena: Secondary | ICD-10-CM | POA: Diagnosis present

## 2014-10-07 DIAGNOSIS — N3010 Interstitial cystitis (chronic) without hematuria: Secondary | ICD-10-CM | POA: Diagnosis present

## 2014-10-07 DIAGNOSIS — F17200 Nicotine dependence, unspecified, uncomplicated: Secondary | ICD-10-CM | POA: Diagnosis present

## 2014-10-07 DIAGNOSIS — Z6831 Body mass index (BMI) 31.0-31.9, adult: Secondary | ICD-10-CM

## 2014-10-07 DIAGNOSIS — R569 Unspecified convulsions: Secondary | ICD-10-CM

## 2014-10-07 DIAGNOSIS — K56609 Unspecified intestinal obstruction, unspecified as to partial versus complete obstruction: Secondary | ICD-10-CM

## 2014-10-07 DIAGNOSIS — Z91030 Bee allergy status: Secondary | ICD-10-CM

## 2014-10-07 DIAGNOSIS — R1110 Vomiting, unspecified: Secondary | ICD-10-CM

## 2014-10-07 DIAGNOSIS — E876 Hypokalemia: Secondary | ICD-10-CM | POA: Diagnosis present

## 2014-10-07 DIAGNOSIS — K5900 Constipation, unspecified: Secondary | ICD-10-CM | POA: Diagnosis present

## 2014-10-07 DIAGNOSIS — I81 Portal vein thrombosis: Principal | ICD-10-CM | POA: Insufficient documentation

## 2014-10-07 DIAGNOSIS — I69351 Hemiplegia and hemiparesis following cerebral infarction affecting right dominant side: Secondary | ICD-10-CM

## 2014-10-07 DIAGNOSIS — G8929 Other chronic pain: Secondary | ICD-10-CM | POA: Diagnosis present

## 2014-10-07 DIAGNOSIS — F1120 Opioid dependence, uncomplicated: Secondary | ICD-10-CM | POA: Diagnosis present

## 2014-10-07 DIAGNOSIS — Z79891 Long term (current) use of opiate analgesic: Secondary | ICD-10-CM | POA: Diagnosis present

## 2014-10-07 DIAGNOSIS — R109 Unspecified abdominal pain: Secondary | ICD-10-CM

## 2014-10-07 DIAGNOSIS — F329 Major depressive disorder, single episode, unspecified: Secondary | ICD-10-CM | POA: Diagnosis present

## 2014-10-07 DIAGNOSIS — Z79899 Other long term (current) drug therapy: Secondary | ICD-10-CM

## 2014-10-07 HISTORY — DX: Opioid dependence, uncomplicated: F11.20

## 2014-10-07 HISTORY — DX: Unspecified pre-eclampsia, unspecified trimester: O14.90

## 2014-10-07 HISTORY — DX: Complete or unspecified spontaneous abortion without complication: O03.9

## 2014-10-07 HISTORY — DX: Portal vein thrombosis: I81

## 2014-10-07 LAB — CBC WITH DIFFERENTIAL/PLATELET
Basophils Absolute: 0 10*3/uL (ref 0.0–0.1)
Basophils Relative: 0 % (ref 0–1)
Eosinophils Absolute: 0 10*3/uL (ref 0.0–0.7)
Eosinophils Relative: 0 % (ref 0–5)
HCT: 33.7 % — ABNORMAL LOW (ref 36.0–46.0)
Hemoglobin: 10.2 g/dL — ABNORMAL LOW (ref 12.0–15.0)
Lymphocytes Relative: 14 % (ref 12–46)
Lymphs Abs: 1 10*3/uL (ref 0.7–4.0)
MCH: 19.5 pg — ABNORMAL LOW (ref 26.0–34.0)
MCHC: 30.3 g/dL (ref 30.0–36.0)
MCV: 64.6 fL — ABNORMAL LOW (ref 78.0–100.0)
Monocytes Absolute: 0.3 10*3/uL (ref 0.1–1.0)
Monocytes Relative: 4 % (ref 3–12)
Neutro Abs: 6 10*3/uL (ref 1.7–7.7)
Neutrophils Relative %: 82 % — ABNORMAL HIGH (ref 43–77)
Platelets: 215 10*3/uL (ref 150–400)
RBC: 5.22 MIL/uL — ABNORMAL HIGH (ref 3.87–5.11)
RDW: 20.6 % — ABNORMAL HIGH (ref 11.5–15.5)
WBC: 7.3 10*3/uL (ref 4.0–10.5)

## 2014-10-07 LAB — URINALYSIS, ROUTINE W REFLEX MICROSCOPIC
Bilirubin Urine: NEGATIVE
Glucose, UA: NEGATIVE mg/dL
Hgb urine dipstick: NEGATIVE
Ketones, ur: NEGATIVE mg/dL
Leukocytes, UA: NEGATIVE
Nitrite: NEGATIVE
Protein, ur: NEGATIVE mg/dL
Specific Gravity, Urine: 1.02 (ref 1.005–1.030)
Urobilinogen, UA: 0.2 mg/dL (ref 0.0–1.0)
pH: 6 (ref 5.0–8.0)

## 2014-10-07 LAB — COMPREHENSIVE METABOLIC PANEL
ALT: 47 U/L (ref 14–54)
AST: 38 U/L (ref 15–41)
Albumin: 3.8 g/dL (ref 3.5–5.0)
Alkaline Phosphatase: 51 U/L (ref 38–126)
Anion gap: 11 (ref 5–15)
BUN: 10 mg/dL (ref 6–20)
CO2: 22 mmol/L (ref 22–32)
Calcium: 9.5 mg/dL (ref 8.9–10.3)
Chloride: 106 mmol/L (ref 101–111)
Creatinine, Ser: 0.8 mg/dL (ref 0.44–1.00)
GFR calc Af Amer: >60 mL/min (ref >60)
GFR calc non Af Amer: >60 mL/min (ref >60)
Glucose, Bld: 109 mg/dL — ABNORMAL HIGH (ref 65–99)
Potassium: 3.6 mmol/L (ref 3.5–5.1)
Sodium: 139 mmol/L (ref 135–145)
Total Bilirubin: 0.5 mg/dL (ref 0.3–1.2)
Total Protein: 6.3 g/dL — ABNORMAL LOW (ref 6.5–8.1)

## 2014-10-07 LAB — I-STAT BETA HCG BLOOD, ED (MC, WL, AP ONLY): I-stat hCG, quantitative: <5 m[IU]/mL (ref <5)

## 2014-10-07 LAB — LIPASE, BLOOD: Lipase: 26 U/L (ref 22–51)

## 2014-10-07 MED ORDER — MORPHINE SULFATE 4 MG/ML IJ SOLN
4.0000 mg | Freq: Once | INTRAMUSCULAR | Status: AC
  Administered 2014-10-07: 4 mg via INTRAVENOUS
  Filled 2014-10-07: qty 1

## 2014-10-07 MED ORDER — HYDROMORPHONE HCL 1 MG/ML IJ SOLN
1.0000 mg | Freq: Once | INTRAMUSCULAR | Status: AC
  Administered 2014-10-07: 1 mg via INTRAVENOUS
  Filled 2014-10-07: qty 1

## 2014-10-07 MED ORDER — IOHEXOL 300 MG/ML  SOLN: 25.0000 mL | Freq: Once | INTRAMUSCULAR | Status: AC | PRN

## 2014-10-07 MED ORDER — SODIUM CHLORIDE 0.9 % IV BOLUS (SEPSIS)
1000.0000 mL | Freq: Once | INTRAVENOUS | Status: AC
  Administered 2014-10-07: 1000 mL via INTRAVENOUS

## 2014-10-07 MED ORDER — ONDANSETRON HCL 4 MG/2ML IJ SOLN
4.0000 mg | Freq: Once | INTRAMUSCULAR | Status: AC
  Administered 2014-10-07: 4 mg via INTRAVENOUS
  Filled 2014-10-07: qty 2

## 2014-10-07 MED ORDER — PROMETHAZINE HCL 25 MG/ML IJ SOLN
25.0000 mg | Freq: Once | INTRAMUSCULAR | Status: AC
Start: 2014-10-07 — End: 2014-10-08
  Administered 2014-10-08: 25 mg via INTRAVENOUS
  Filled 2014-10-07: qty 1

## 2014-10-07 NOTE — ED Provider Notes (Signed)
CSN: [id number]     Arrival date & time 10/07/14  2037 History   First MD Initiated Contact with Patient 10/07/14 2047     Chief Complaint  Patient presents with  . Abdominal Pain     (Consider location/radiation/quality/duration/timing/severity/associated sxs/prior Treatment) HPI Comments: Patient is a 24 year old female with a past medical history of CVA of cortical vein, methadone use, and anemia who presents with abdominal pain that started yesterday. The pain is located in the LLQ and does not radiate. The pain is described as aching and severe. The pain started gradually and progressively worsened since the onset. No alleviating/aggravating factors. The patient has tried nothing for symptoms without relief. Associated symptoms include nausea and vomiting. Patient denies fever, headache, diarrhea, chest pain, SOB, dysuria, constipation, abnormal vaginal bleeding/discharge. Patient seen yesterday in the ED with an unremarkable work up. Patient returns due to worsening pain.      Past Medical History  Diagnosis Date  . Interstitial cystitis   . Chronic pelvic pain in female   . Headache(784.0)   . Infection     UTI  . Pyelonephritis affecting pregnancy in first trimester July 2015  . Depression 2007  . Substance abuse   . Pneumonia 03/2014  . Hemiparesis ~ 05/15/2014    RUE/RLE "worse in my arm"  . Seizures   . Stroke   . Numbness     right side  . Dizziness 07/18/14  . Confusion    Past Surgical History  Procedure Laterality Date  . Vagina surgery  ~ 2011    tumor removed, went to cancer center in [facility]  . Cesarean section N/A 05/06/2014    Procedure: CESAREAN SECTION;  Surgeon: Levie Heritage, DO;  Location: [facility];  Service: Obstetrics;  Laterality: N/A;  . Inguinal hernia repair Left ~ 2014   Family History  Problem Relation Age of Onset  . Diabetes Mother   . Heart disease Mother   . Hypertension Father   . Diabetes Maternal Grandmother   . Hypertension Maternal  Grandmother   . Stroke Maternal Grandmother   . Diabetes Maternal Grandfather   . Hypertension Maternal Grandfather   . Diabetes Paternal Grandmother   . Diabetes Paternal Grandfather    History  Substance Use Topics  . Smoking status: Current Every Day Smoker -- 0.50 packs/day for 8 years    Types: Cigarettes  . Smokeless tobacco: Never Used     Comment: cutting back  . Alcohol Use: No   OB History    Gravida Para Term Preterm AB TAB SAB Ectopic Multiple Living   0 1 0 1 0 0 2     Review of Systems  Constitutional: Negative for fever, chills and fatigue.  HENT: Negative for trouble swallowing.   Eyes: Negative for visual disturbance.  Respiratory: Negative for shortness of breath.   Cardiovascular: Negative for chest pain and palpitations.  Gastrointestinal: Positive for nausea, vomiting and abdominal pain. Negative for diarrhea.  Genitourinary: Negative for dysuria and difficulty urinating.  Musculoskeletal: Negative for arthralgias and neck pain.  Skin: Negative for color change.  Neurological: Negative for dizziness and weakness.  Psychiatric/Behavioral: Negative for dysphoric mood.      Allergies  Ativan; Bee venom; and Ciprofloxacin  Home Medications   Prior to Admission medications   Medication Sig Start Date End Date Taking? Authorizing Provider  apixaban (ELIQUIS) 5 MG TABS tablet Take 1 tablet (5 mg total) by mouth 2 (two) times daily. 06/19/14 10/07/14  Yes Jaclyn Shaggy, MD  EPINEPHrine 0.3 mg/0.3 mL IJ SOAJ injection Inject 0.3 mLs (0.3 mg total) into the muscle once. 06/16/14  Yes Jaclyn Shaggy, MD  FLUoxetine (PROZAC) 20 MG capsule Take 1 capsule (20 mg total) by mouth daily. 07/18/14  Yes Micki Riley, MD  levETIRAcetam (KEPPRA) 500 MG tablet Take 1 tablet (500 mg total) by mouth 2 (two) times daily. 06/13/14  Yes Richarda Overlie, MD  dicyclomine (BENTYL) 20 MG tablet Take 1 tablet (20 mg total) by mouth 2 (two) times daily. 10/06/14   Danelle Berry, PA-C   diphenoxylate-atropine (LOMOTIL) 2.5-0.025 MG per tablet Take 1 tablet by mouth 4 (four) times daily as needed for diarrhea or loose stools. 10/06/14   Danelle Berry, PA-C  eletriptan (RELPAX) 40 MG tablet Take 1 tablet (40 mg total) by mouth as needed for migraine or headache. May repeat in 2 hours if headache persists or recurs. Patient not taking: Reported on 08/05/2014 07/18/14   Micki Riley, MD  ELIQUIS 5 MG TABS tablet TAKE 1 TABLET (5 MG TOTAL) BY MOUTH 2 (TWO) TIMES DAILY. 09/11/14   Historical Provider, MD  HYDROcodone-acetaminophen (NORCO/VICODIN) 5-325 MG per tablet Take 1 tablet by mouth every 4 (four) hours as needed. Patient not taking: Reported on 10/07/2014 08/06/14   Theda Belfast, MD  magnesium hydroxide (MILK OF MAGNESIA) 400 MG/5ML suspension Take 15 mLs by mouth at bedtime. Patient taking differently: Take 15 mLs by mouth daily as needed for mild constipation or moderate constipation.  07/13/14   Josalyn Funches, MD  ondansetron (ZOFRAN-ODT) 8 MG disintegrating tablet DISSOLVE 1 TABLET UNDER THE TONGUE EVERY 8 (EIGHT) HRS AS NEEDED FOR NAUSEA OR VOMITING. 09/11/14   Historical Provider, MD   BP 130/74 mmHg  Pulse 58  Temp(Src) 97.8 F (36.6 C) (Oral)  Resp 16  Ht 5\' 2"  (1.575 m)  Wt 170 lb (77.111 kg)  BMI 31.09 kg/m2  SpO2 100%  LMP 10/03/2014 Physical Exam  Constitutional: She appears well-developed and well-nourished. No distress.  HENT:  Head: Normocephalic and atraumatic.  Eyes: Conjunctivae and EOM are normal.  Neck: Normal range of motion.  Cardiovascular: Normal rate and regular rhythm.  Exam reveals no gallop and no friction rub.   No murmur heard. Pulmonary/Chest: Effort normal and breath sounds normal. She has no wheezes. She has no rales. She exhibits no tenderness.  Abdominal: Soft. She exhibits no distension. There is tenderness. There is no rebound.  LLQ tenderness to palpation. No other focal tenderness.   Musculoskeletal: Normal range of motion.   Neurological: She is alert.  Speech is goal-oriented. Moves limbs without ataxia.   Skin: Skin is warm and dry.  Psychiatric: She has a normal mood and affect. Her behavior is normal.  Nursing note and vitals reviewed.   ED Course  Procedures (including critical care time) Labs Review Labs Reviewed  CBC WITH DIFFERENTIAL/PLATELET - Abnormal; Notable for the following:    RBC 5.22 (*)    Hemoglobin 10.2 (*)    HCT 33.7 (*)    MCV 64.6 (*)    MCH 19.5 (*)    RDW 20.6 (*)    Neutrophils Relative % 82 (*)    All other components within normal limits  COMPREHENSIVE METABOLIC PANEL - Abnormal; Notable for the following:    Glucose, Bld 109 (*)    Total Protein 6.3 (*)    All other components within normal limits  URINE RAPID DRUG SCREEN, HOSP PERFORMED - Abnormal; Notable for the following:  Opiates POSITIVE (*)    All other components within normal limits  LIPASE, BLOOD  URINALYSIS, ROUTINE W REFLEX MICROSCOPIC (NOT AT [facility])  BASIC METABOLIC PANEL  CBC  I-STAT BETA HCG BLOOD, ED ([facility], [facility], [facility] ONLY)    Imaging Review Ct Abdomen Pelvis Wo Contrast  10/08/2014   CLINICAL DATA:  Generalized abdominal pain, greatest in the left lower quadrant.  EXAM: CT ABDOMEN AND PELVIS WITHOUT CONTRAST  TECHNIQUE: Multidetector CT imaging of the abdomen and pelvis was performed following the standard protocol without IV contrast.  COMPARISON:  07/17/2013  FINDINGS: There is pneumatosis of the cecum and ascending colon with a generous volume of air in the wall. There also is a little air which appears to extend toward the undersurface of the gallbladder and liver, perhaps in the hepatoduodenal ligament. There is a small bubble of air in the porta hepatis which may reside within the portal vein. No frank free intraperitoneal air is evident. Despite the rather generous volume of pneumatosis, there is no significant inflammatory change about the cecum or ascending colon. There is no bowel obstruction.  Appendix is normal.  There are unremarkable unenhanced appearances of the liver, spleen, pancreas, adrenals and kidneys. Uterus and ovaries appear normal. There is no significant abnormality in the lower chest. There is no significant musculoskeletal abnormality.  IMPRESSION: Pneumatosis of the cecum and ascending colon. There are a few bubbles of air extending up toward the under surface of the gallbladder but the air does not appear to be free in the peritoneum. There also may be a bubble of air in the portal vein. Despite the generous volume of extraluminal air, there is little or no inflammatory change. There is no obstruction. There is no ascites. These results were called by telephone at the time of interpretation on 10/08/2014 at 2:51 am to Dr. Wilkie Aye, who verbally acknowledged these results.   Electronically Signed   By: Ellery Plunk M.D.   On: 10/08/2014 02:57   Dg Chest 2 View  10/06/2014   CLINICAL DATA:  Abdominal pain and shortness of breath for 5 months.  EXAM: CHEST  2 VIEW  COMPARISON:  Chest radiograph 06/12/2014  FINDINGS: The heart is normal in size. Mild diffuse bronchial thickening. Pulmonary vasculature is normal. No consolidation, pleural effusion, or pneumothorax. No acute osseous abnormalities are seen.  IMPRESSION: Mild diffuse bronchial thickening, can be seen with asthma or bronchitis.   Electronically Signed   By: Rubye Oaks M.D.   On: 10/06/2014 21:29   Dg Abd 2 Views  10/06/2014   CLINICAL DATA:  Abdominal pain and diarrhea for 5 months.  EXAM: ABDOMEN - 2 VIEW  COMPARISON:  07/13/2014  FINDINGS: There is no free intra-abdominal air. No dilated bowel loops to suggest obstruction. Moderate volume of stool throughout the colon. No radiopaque calculi. Pelvic phleboliths and surgical clips projecting over the left inguinal region, unchanged. There is chronic widening of the pubic symphysis. No acute osseous abnormalities are seen.  IMPRESSION: Normal abdominal radiographs  with moderate volume of colonic stool.   Electronically Signed   By: Rubye Oaks M.D.   On: 10/06/2014 21:32     EKG Interpretation   Date/Time:  Friday October 07 2014 20:47:09 EDT Ventricular Rate:  61 PR Interval:  158 QRS Duration: 78 QT Interval:  417 QTC Calculation: 420 R Axis:   72 Text Interpretation:  Sinus rhythm ED PHYSICIAN INTERPRETATION AVAILABLE  IN [facility] Confirmed by TEST, Record ([id number]) on 10/08/2014 2:10:35  PM  MDM   Final diagnoses:  Pneumatosis of intestines    9:21 PM Labs pending. Vitals stable and patient afebrile.   Patient's labs unremarkable for acute changes. Patient had a CT to evaluate her intractable abdominal pain despite multiple doses of pain medication. Patient unable to have IV contrast due to only able to get a 24g IV. Patient's CT done with PO contrast only. CT shows pneumatosis of the cecum and ascending colon. I spoke with Dr. Derrell Lolling who saw the patient and does not believe this is a surgical issue at this time. Patient will be admitted to Dr. Julian Reil and started on IV antibiotics.       Emilia Beck, PA-C 10/08/14 1942  Blake Divine, MD 10/09/14 [phone]

## 2014-10-07 NOTE — ED Notes (Signed)
Pt c/o of abdominal pain all over her stomach. Pt states pain started last night with n/v/d. Pt was seen and discharge last night for same pain.

## 2014-10-07 NOTE — ED Notes (Signed)
Per EMS. Pt seen in ED yesterday for generalized abd pain and emesis. Today pt found in restroom floor at [employer]. Pt told EMS when she went to the restroom the pain became so bad she fell down.

## 2014-10-08 ENCOUNTER — Emergency Department (HOSPITAL_COMMUNITY): Payer: Medicaid Other

## 2014-10-08 ENCOUNTER — Encounter (HOSPITAL_COMMUNITY): Payer: Self-pay | Admitting: Emergency Medicine

## 2014-10-08 DIAGNOSIS — Z86718 Personal history of other venous thrombosis and embolism: Secondary | ICD-10-CM | POA: Diagnosis not present

## 2014-10-08 DIAGNOSIS — Z91030 Bee allergy status: Secondary | ICD-10-CM | POA: Diagnosis not present

## 2014-10-08 DIAGNOSIS — D6859 Other primary thrombophilia: Secondary | ICD-10-CM | POA: Diagnosis present

## 2014-10-08 DIAGNOSIS — F329 Major depressive disorder, single episode, unspecified: Secondary | ICD-10-CM | POA: Diagnosis present

## 2014-10-08 DIAGNOSIS — I69351 Hemiplegia and hemiparesis following cerebral infarction affecting right dominant side: Secondary | ICD-10-CM | POA: Diagnosis not present

## 2014-10-08 DIAGNOSIS — F1120 Opioid dependence, uncomplicated: Secondary | ICD-10-CM | POA: Diagnosis present

## 2014-10-08 DIAGNOSIS — G40909 Epilepsy, unspecified, not intractable, without status epilepticus: Secondary | ICD-10-CM | POA: Diagnosis present

## 2014-10-08 DIAGNOSIS — R1030 Lower abdominal pain, unspecified: Secondary | ICD-10-CM | POA: Diagnosis not present

## 2014-10-08 DIAGNOSIS — I81 Portal vein thrombosis: Secondary | ICD-10-CM | POA: Diagnosis present

## 2014-10-08 DIAGNOSIS — D509 Iron deficiency anemia, unspecified: Secondary | ICD-10-CM | POA: Diagnosis present

## 2014-10-08 DIAGNOSIS — Z79899 Other long term (current) drug therapy: Secondary | ICD-10-CM | POA: Diagnosis not present

## 2014-10-08 DIAGNOSIS — F17210 Nicotine dependence, cigarettes, uncomplicated: Secondary | ICD-10-CM | POA: Diagnosis present

## 2014-10-08 DIAGNOSIS — G8929 Other chronic pain: Secondary | ICD-10-CM | POA: Diagnosis present

## 2014-10-08 DIAGNOSIS — D500 Iron deficiency anemia secondary to blood loss (chronic): Secondary | ICD-10-CM | POA: Diagnosis not present

## 2014-10-08 DIAGNOSIS — E876 Hypokalemia: Secondary | ICD-10-CM | POA: Diagnosis present

## 2014-10-08 DIAGNOSIS — K5900 Constipation, unspecified: Secondary | ICD-10-CM | POA: Diagnosis present

## 2014-10-08 DIAGNOSIS — Z888 Allergy status to other drugs, medicaments and biological substances status: Secondary | ICD-10-CM | POA: Diagnosis not present

## 2014-10-08 DIAGNOSIS — E669 Obesity, unspecified: Secondary | ICD-10-CM | POA: Diagnosis present

## 2014-10-08 DIAGNOSIS — R1084 Generalized abdominal pain: Secondary | ICD-10-CM | POA: Diagnosis not present

## 2014-10-08 DIAGNOSIS — K921 Melena: Secondary | ICD-10-CM | POA: Diagnosis present

## 2014-10-08 DIAGNOSIS — D649 Anemia, unspecified: Secondary | ICD-10-CM | POA: Diagnosis present

## 2014-10-08 DIAGNOSIS — K6389 Other specified diseases of intestine: Secondary | ICD-10-CM | POA: Diagnosis present

## 2014-10-08 DIAGNOSIS — N3010 Interstitial cystitis (chronic) without hematuria: Secondary | ICD-10-CM | POA: Diagnosis present

## 2014-10-08 DIAGNOSIS — Z881 Allergy status to other antibiotic agents status: Secondary | ICD-10-CM | POA: Diagnosis not present

## 2014-10-08 DIAGNOSIS — Z6831 Body mass index (BMI) 31.0-31.9, adult: Secondary | ICD-10-CM | POA: Diagnosis not present

## 2014-10-08 DIAGNOSIS — R569 Unspecified convulsions: Secondary | ICD-10-CM | POA: Diagnosis not present

## 2014-10-08 DIAGNOSIS — Z7901 Long term (current) use of anticoagulants: Secondary | ICD-10-CM | POA: Diagnosis not present

## 2014-10-08 LAB — RAPID URINE DRUG SCREEN, HOSP PERFORMED
Amphetamines: NOT DETECTED
Barbiturates: NOT DETECTED
Benzodiazepines: NOT DETECTED
Cocaine: NOT DETECTED
Opiates: POSITIVE — AB
Tetrahydrocannabinol: NOT DETECTED

## 2014-10-08 MED ORDER — HEPARIN SODIUM (PORCINE) 5000 UNIT/ML IJ SOLN: 5000.0000 [IU] | Freq: Three times a day (TID) | INTRAMUSCULAR | Status: DC

## 2014-10-08 MED ORDER — ACETAMINOPHEN 650 MG RE SUPP: 650.0000 mg | Freq: Four times a day (QID) | RECTAL | Status: DC | PRN

## 2014-10-08 MED ORDER — POTASSIUM CHLORIDE IN NACL 20-0.9 MEQ/L-% IV SOLN
INTRAVENOUS | Status: DC
  Administered 2014-10-08 – 2014-10-09 (×3): 1000 mL via INTRAVENOUS
  Administered 2014-10-09: 05:00:00 via INTRAVENOUS
  Administered 2014-10-09: 1000 mL via INTRAVENOUS
  Administered 2014-10-10 (×2): via INTRAVENOUS
  Administered 2014-10-11: 100 mL/h via INTRAVENOUS
  Filled 2014-10-08 (×14): qty 1000

## 2014-10-08 MED ORDER — ALUM & MAG HYDROXIDE-SIMETH 200-200-20 MG/5ML PO SUSP
30.0000 mL | Freq: Four times a day (QID) | ORAL | Status: DC | PRN
  Administered 2014-10-10 – 2014-10-14 (×2): 30 mL via ORAL
  Filled 2014-10-08 (×2): qty 30

## 2014-10-08 MED ORDER — ONDANSETRON HCL 4 MG/2ML IJ SOLN
4.0000 mg | Freq: Once | INTRAMUSCULAR | Status: AC
  Administered 2014-10-08: 4 mg via INTRAVENOUS
  Filled 2014-10-08: qty 2

## 2014-10-08 MED ORDER — APIXABAN 5 MG PO TABS
5.0000 mg | ORAL_TABLET | Freq: Two times a day (BID) | ORAL | Status: DC
  Administered 2014-10-08 – 2014-10-09 (×3): 5 mg via ORAL
  Filled 2014-10-08 (×3): qty 1

## 2014-10-08 MED ORDER — EPINEPHRINE 0.3 MG/0.3ML IJ SOAJ: 0.3000 mg | Freq: Once | INTRAMUSCULAR | Status: DC

## 2014-10-08 MED ORDER — HYDROMORPHONE HCL 1 MG/ML IJ SOLN
1.0000 mg | Freq: Once | INTRAMUSCULAR | Status: AC
  Administered 2014-10-08: 1 mg via INTRAVENOUS
  Filled 2014-10-08: qty 1

## 2014-10-08 MED ORDER — LEVETIRACETAM 500 MG/5ML IV SOLN
500.0000 mg | Freq: Two times a day (BID) | INTRAVENOUS | Status: DC
  Administered 2014-10-08 – 2014-10-12 (×10): 500 mg via INTRAVENOUS
  Filled 2014-10-08 (×15): qty 5

## 2014-10-08 MED ORDER — METHADONE HCL 10 MG PO TABS
84.0000 mg | ORAL_TABLET | Freq: Every day | ORAL | Status: DC
  Administered 2014-10-08 – 2014-10-16 (×9): 85 mg via ORAL
  Filled 2014-10-08 (×9): qty 9

## 2014-10-08 MED ORDER — MORPHINE SULFATE 2 MG/ML IJ SOLN
1.0000 mg | INTRAMUSCULAR | Status: DC | PRN
  Administered 2014-10-08 – 2014-10-10 (×10): 1 mg via INTRAVENOUS
  Filled 2014-10-08 (×10): qty 1

## 2014-10-08 MED ORDER — ONDANSETRON HCL 4 MG/2ML IJ SOLN
4.0000 mg | Freq: Four times a day (QID) | INTRAMUSCULAR | Status: DC | PRN
  Administered 2014-10-08 – 2014-10-12 (×9): 4 mg via INTRAVENOUS
  Filled 2014-10-08 (×10): qty 2

## 2014-10-08 MED ORDER — ONDANSETRON HCL 4 MG PO TABS
4.0000 mg | ORAL_TABLET | Freq: Four times a day (QID) | ORAL | Status: DC | PRN
  Administered 2014-10-10 – 2014-10-12 (×3): 4 mg via ORAL
  Filled 2014-10-08 (×4): qty 1

## 2014-10-08 MED ORDER — KETOROLAC TROMETHAMINE 30 MG/ML IJ SOLN
30.0000 mg | Freq: Four times a day (QID) | INTRAMUSCULAR | Status: DC | PRN
  Administered 2014-10-08 – 2014-10-13 (×18): 30 mg via INTRAVENOUS
  Filled 2014-10-08 (×18): qty 1

## 2014-10-08 MED ORDER — ACETAMINOPHEN 325 MG PO TABS
650.0000 mg | ORAL_TABLET | Freq: Four times a day (QID) | ORAL | Status: DC | PRN
  Administered 2014-10-13 – 2014-10-14 (×2): 650 mg via ORAL
  Filled 2014-10-08 (×2): qty 2

## 2014-10-08 MED ORDER — METOCLOPRAMIDE HCL 5 MG/ML IJ SOLN
10.0000 mg | Freq: Four times a day (QID) | INTRAMUSCULAR | Status: DC | PRN
  Administered 2014-10-10 – 2014-10-14 (×4): 10 mg via INTRAVENOUS
  Filled 2014-10-08 (×4): qty 2

## 2014-10-08 MED ORDER — HYDROMORPHONE HCL 1 MG/ML IJ SOLN
1.0000 mg | Freq: Once | INTRAMUSCULAR | Status: AC
Start: 2014-10-08 — End: 2014-10-08
  Administered 2014-10-08: 1 mg via INTRAVENOUS
  Filled 2014-10-08: qty 1

## 2014-10-08 MED ORDER — BISACODYL 10 MG RE SUPP
10.0000 mg | Freq: Once | RECTAL | Status: DC
  Filled 2014-10-08: qty 1

## 2014-10-08 MED ORDER — PIPERACILLIN-TAZOBACTAM 3.375 G IVPB 30 MIN
3.3750 g | Freq: Once | INTRAVENOUS | Status: AC
  Administered 2014-10-08: 3.375 g via INTRAVENOUS
  Filled 2014-10-08: qty 50

## 2014-10-08 MED ORDER — POLYETHYLENE GLYCOL 3350 17 GM/SCOOP PO POWD
1.0000 | Freq: Once | ORAL | Status: DC
  Filled 2014-10-08: qty 255

## 2014-10-08 MED ORDER — NICOTINE 21 MG/24HR TD PT24
21.0000 mg | MEDICATED_PATCH | Freq: Every day | TRANSDERMAL | Status: DC
  Administered 2014-10-08 – 2014-10-16 (×9): 21 mg via TRANSDERMAL
  Filled 2014-10-08 (×9): qty 1

## 2014-10-08 MED ORDER — FLUOXETINE HCL 20 MG PO CAPS
20.0000 mg | ORAL_CAPSULE | Freq: Every day | ORAL | Status: DC
  Administered 2014-10-08 – 2014-10-16 (×9): 20 mg via ORAL
  Filled 2014-10-08 (×9): qty 1

## 2014-10-08 MED ORDER — IOHEXOL 300 MG/ML  SOLN
25.0000 mL | Freq: Once | INTRAMUSCULAR | Status: AC | PRN
  Administered 2014-10-08: 25 mL via ORAL

## 2014-10-08 NOTE — Progress Notes (Addendum)
ANTICOAGULATION CONSULT NOTE - Initial Consult  Pharmacy Consult for Eliquis (Apixaban) Indication: stroke  Allergies  Allergen Reactions  . Ativan [Lorazepam] Other (See Comments)    " I don't remember anything."  . Bee Venom Swelling  . Ciprofloxacin Diarrhea and Nausea And Vomiting    Patient Measurements: Height: 5\' 2"  (157.5 cm) Weight: 170 lb (77.111 kg) IBW/kg (Calculated) : 50.1  Vital Signs: Temp: 99.8 F (37.7 C) (07/30 0805) Temp Source: Oral (07/30 0805) BP: 129/71 mmHg (07/30 0805) Pulse Rate: 65 (07/30 0805)  Labs:  Recent Labs  10/06/14 1751 10/06/14 2136 10/07/14 2125  HGB  --  9.2* 10.2*  HCT  --  31.0* 33.7*  PLT  --  226 215  CREATININE 0.69  --  0.80    Estimated Creatinine Clearance: 103.4 mL/min (by C-G formula based on Cr of 0.8).   Medical History: Past Medical History  Diagnosis Date  . Interstitial cystitis   . Chronic pelvic pain in female   . Headache(784.0)   . Infection     UTI  . Pyelonephritis affecting pregnancy in first trimester July 2015  . Depression 2007  . Substance abuse   . Pneumonia 03/2014  . Hemiparesis ~ 05/15/2014    RUE/RLE "worse in my arm"  . Seizures   . Stroke   . Numbness     right side  . Dizziness 07/18/14  . Confusion     Medications:  Scheduled:  . apixaban  5 mg Oral BID  . bisacodyl  10 mg Rectal Once  . FLUoxetine  20 mg Oral Daily  . levETIRAcetam  500 mg Intravenous Q12H  . methadone  85 mg Oral Daily  . nicotine  21 mg Transdermal Daily  . polyethylene glycol powder  1 Container Oral Once    Assessment: Victoria Alvarez with compliants of GI pain and multiple ED visit. Noted to have had a stroke 4 months ago. On eliquis 5mg  bid PTA. Pt reported not taking Eliquis for past week. Pt reported blood in her stool in past but none recently. Wt 77 kg, Hgb 10.2, Plt wnl, Scr 0.8  Goal of Therapy:  Monitor platelets by anticoagulation protocol: Yes   Plan:  Continue home Eliquis at 5mg  bid Pharmacy  will sign off and follow peripherally  Remi Haggard, PharmD Clinical Pharmacist- Resident Pager: [phone]  10/08/2014,8:27 AM

## 2014-10-08 NOTE — Discharge Instructions (Addendum)
Follow with Primary MD Minerva Ends, MD in 7 days   Get CBC, CMP, 2 view Chest X ray checked  by Primary MD next visit.    Activity: As tolerated with Full fall precautions use walker/cane & assistance as needed   Disposition Home     Diet: Heart Healthy soft diet.  For Heart failure patients - Check your Weight same time everyday, if you gain over 2 pounds, or you develop in leg swelling, experience more shortness of breath or chest pain, call your Primary MD immediately. Follow Cardiac Low Salt Diet and 1.5 lit/day fluid restriction.   On your next visit with your primary care physician please Get Medicines reviewed and adjusted.   Please request your Prim.MD to go over all Hospital Tests and Procedure/Radiological results at the follow up, please get all Hospital records sent to your Prim MD by signing hospital release before you go home.   If you experience worsening of your admission symptoms, develop shortness of breath, life threatening emergency, suicidal or homicidal thoughts you must seek medical attention immediately by calling 911 or calling your MD immediately  if symptoms less severe.  You Must read complete instructions/literature along with all the possible adverse reactions/side effects for all the Medicines you take and that have been prescribed to you. Take any new Medicines after you have completely understood and accpet all the possible adverse reactions/side effects.   Do not drive, operating heavy machinery, perform activities at heights, swimming or participation in water activities or provide baby sitting services if your were admitted for syncope or siezures until you have seen by Primary MD or a Neurologist and advised to do so again.  Do not drive when taking Pain medications.    Do not take more than prescribed Pain, Sleep and Anxiety Medications  Special Instructions: If you have smoked or chewed Tobacco  in the last 2 yrs please stop smoking, stop  any regular Alcohol  and or any Recreational drug use.  Wear Seat belts while driving.   Please note  You were cared for by a hospitalist during your hospital stay. If you have any questions about your discharge medications or the care you received while you were in the hospital after you are discharged, you can call the unit and asked to speak with the hospitalist on call if the hospitalist that took care of you is not available. Once you are discharged, your primary care physician will handle any further medical issues. Please note that NO REFILLS for any discharge medications will be authorized once you are discharged, as it is imperative that you return to your primary care physician (or establish a relationship with a primary care physician if you do not have one) for your aftercare needs so that they can reassess your need for medications and monitor your lab values.     Information on my medicine - ELIQUIS (apixaban)  This medication education was reviewed with me or my healthcare representative as part of my discharge preparation.  The pharmacist that spoke with me during my hospital stay was:  Darl Pikes, [facility]  Why was Eliquis prescribed for you? Eliquis was prescribed for you to reduce the risk of a blood clot forming that can cause a stroke if you have a medical condition called atrial fibrillation (a type of irregular heartbeat).  What do You need to know about Eliquis ? Take your Eliquis TWICE DAILY - one tablet in the morning and one tablet in the  evening with or without food. If you have difficulty swallowing the tablet whole please discuss with your pharmacist how to take the medication safely.  Take Eliquis exactly as prescribed by your doctor and DO NOT stop taking Eliquis without talking to the doctor who prescribed the medication.  Stopping may increase your risk of developing a stroke.  Refill your prescription before you run out.  After discharge, you should  have regular check-up appointments with your healthcare provider that is prescribing your Eliquis.  In the future your dose may need to be changed if your kidney function or weight changes by a significant amount or as you get older.  What do you do if you miss a dose? If you miss a dose, take it as soon as you remember on the same day and resume taking twice daily.  Do not take more than one dose of ELIQUIS at the same time to make up a missed dose.  Important Safety Information A possible side effect of Eliquis is bleeding. You should call your healthcare provider right away if you experience any of the following: ? Bleeding from an injury or your nose that does not stop. ? Unusual colored urine (red or dark brown) or unusual colored stools (red or black). ? Unusual bruising for unknown reasons. ? A serious fall or if you hit your head (even if there is no bleeding).  Some medicines may interact with Eliquis and might increase your risk of bleeding or clotting while on Eliquis. To help avoid this, consult your healthcare provider or pharmacist prior to using any new prescription or non-prescription medications, including herbals, vitamins, non-steroidal anti-inflammatory drugs (NSAIDs) and supplements.  This website has more information on Eliquis (apixaban): http://www.eliquis.com/eliquis/home

## 2014-10-08 NOTE — Consult Note (Signed)
Reason for Consult:Pneumotosis of cecum Referring Physician: Dr. Randal Buba Helwig is an 24 y.o. female.  HPI: Patient is a 24 year old female, current smoker, with complicated medical history secondary to C-section delivery February 2016, methadone use, stroke, seizures, currently on Eliquis and Keppra, who presents with 4 months of diarrhea, nausea, decreased appetite, epigastric pain associated with eating which is followed by immediate diarrhea.  Patient states that her most recent abdominal pain began approximately 2 days ago. She does state that she has had some nausea and vomiting. The patient also states that she has blood in her stool at times none recently. Patient states that she has had no sick contacts recently.  Patient is in the ER yesterday and was discharged. Patient represented to the ER secondary to continued abdominal pain. She states the abdominal pain is in the left lower quadrant. Patient underwent evaluation with laboratory studies and CT scan. Laboratory studies within normal limits and no elevated WBCs. CT of the abdomen and pelvis reveal pneumatosis of the cecum. There appears to be normal left and sigmoid colon.  Past Medical History  Diagnosis Date  . Interstitial cystitis   . Chronic pelvic pain in female   . Headache(784.0)   . Infection     UTI  . Pyelonephritis affecting pregnancy in first trimester July 2015  . Depression 2007  . Substance abuse   . Pneumonia 03/2014  . Hemiparesis ~ 05/15/2014    RUE/RLE "worse in my arm"  . Seizures   . Stroke   . Numbness     right side  . Dizziness 07/18/14  . Confusion     Past Surgical History  Procedure Laterality Date  . Vagina surgery  ~ 2011    tumor removed, went to [facility] center [facility]  . Cesarean section N/A 05/06/2014    Procedure: CESAREAN SECTION;  Surgeon: Truett Mainland, DO;  Location: [facility];  Service: Obstetrics;  Laterality: N/A;  . Inguinal hernia repair Left ~ 2014    Family History   Problem Relation Age of Onset  . Diabetes Mother   . Heart disease Mother   . Hypertension Father   . Diabetes Maternal Grandmother   . Hypertension Maternal Grandmother   . Stroke Maternal Grandmother   . Diabetes Maternal Grandfather   . Hypertension Maternal Grandfather   . Diabetes Paternal Grandmother   . Diabetes Paternal Grandfather     Social History:  reports that she has been smoking Cigarettes.  She has a 4 pack-year smoking history. She has never used smokeless tobacco. She reports that she does not drink alcohol or use illicit drugs.  Allergies:  Allergies  Allergen Reactions  . Ativan [Lorazepam] Other (See Comments)    " I don't remember anything."  . Bee Venom Swelling  . Ciprofloxacin Diarrhea and Nausea And Vomiting    Medications: I have reviewed the patient's current medications.  Results for orders placed or performed during the hospital encounter of 10/07/14 (from the past 48 hour(s))  CBC with Differential/Platelet     Status: Abnormal   Collection Time: 10/07/14  9:25 PM  Result Value Ref Range   WBC 7.3 4.0 - 10.5 K/uL   RBC 5.22 (H) 3.87 - 5.11 MIL/uL   Hemoglobin 10.2 (L) 12.0 - 15.0 g/dL   HCT 33.7 (L) 36.0 - 46.0 %   MCV 64.6 (L) 78.0 - 100.0 fL   MCH 19.5 (L) 26.0 - 34.0 pg   MCHC 30.3 30.0 - 36.0 g/dL  RDW 20.6 (H) 11.5 - 15.5 %   Platelets 215 150 - 400 K/uL   Neutrophils Relative % 82 (H) 43 - 77 %   Neutro Abs 6.0 1.7 - 7.7 K/uL   Lymphocytes Relative 14 12 - 46 %   Lymphs Abs 1.0 0.7 - 4.0 K/uL   Monocytes Relative 4 3 - 12 %   Monocytes Absolute 0.3 0.1 - 1.0 K/uL   Eosinophils Relative 0 0 - 5 %   Eosinophils Absolute 0.0 0.0 - 0.7 K/uL   Basophils Relative 0 0 - 1 %   Basophils Absolute 0.0 0.0 - 0.1 K/uL  Comprehensive metabolic panel     Status: Abnormal   Collection Time: 10/07/14  9:25 PM  Result Value Ref Range   Sodium 139 135 - 145 mmol/L   Potassium 3.6 3.5 - 5.1 mmol/L   Chloride 106 101 - 111 mmol/L   CO2 22 22  - 32 mmol/L   Glucose, Bld 109 (H) 65 - 99 mg/dL   BUN 10 6 - 20 mg/dL   Creatinine, Ser 0.80 0.44 - 1.00 mg/dL   Calcium 9.5 8.9 - 10.3 mg/dL   Total Protein 6.3 (L) 6.5 - 8.1 g/dL   Albumin 3.8 3.5 - 5.0 g/dL   AST 38 15 - 41 U/L   ALT 47 14 - 54 U/L   Alkaline Phosphatase 51 38 - 126 U/L   Total Bilirubin 0.5 0.3 - 1.2 mg/dL   GFR calc non Af Amer >60 >60 mL/min   GFR calc Af Amer >60 >60 mL/min    Comment: (NOTE) The eGFR has been calculated using the CKD EPI equation. This calculation has not been validated in all clinical situations. eGFR's persistently <60 mL/min signify possible Chronic Kidney Disease.    Anion gap 11 5 - 15  Lipase, blood     Status: None   Collection Time: 10/07/14  9:25 PM  Result Value Ref Range   Lipase 26 22 - 51 U/L  I-Stat Beta hCG blood, ED ([facility], [facility], [facility] only)     Status: None   Collection Time: 10/07/14  9:51 PM  Result Value Ref Range   I-stat hCG, quantitative <5.0 <5 mIU/mL   Comment 3            Comment:   GEST. AGE      CONC.  (mIU/mL)   <=1 WEEK        5 - 50     2 WEEKS       50 - 500     3 WEEKS       100 - 10,000     4 WEEKS     1,000 - 30,000        FEMALE AND NON-PREGNANT FEMALE:     LESS THAN 5 mIU/mL   Urinalysis, Routine w reflex microscopic (not at [facility])     Status: None   Collection Time: 10/07/14 11:05 PM  Result Value Ref Range   Color, Urine YELLOW YELLOW   APPearance CLEAR CLEAR   Specific Gravity, Urine 1.020 1.005 - 1.030   pH 6.0 5.0 - 8.0   Glucose, UA NEGATIVE NEGATIVE mg/dL   Hgb urine dipstick NEGATIVE NEGATIVE   Bilirubin Urine NEGATIVE NEGATIVE   Ketones, ur NEGATIVE NEGATIVE mg/dL   Protein, ur NEGATIVE NEGATIVE mg/dL   Urobilinogen, UA 0.2 0.0 - 1.0 mg/dL   Nitrite NEGATIVE NEGATIVE   Leukocytes, UA NEGATIVE NEGATIVE    Comment: MICROSCOPIC NOT DONE ON  URINES WITH NEGATIVE PROTEIN, BLOOD, LEUKOCYTES, NITRITE, OR GLUCOSE <1000 mg/dL.    Ct Abdomen Pelvis Wo Contrast  10/08/2014   CLINICAL DATA:   Generalized abdominal pain, greatest in the left lower quadrant.  EXAM: CT ABDOMEN AND PELVIS WITHOUT CONTRAST  TECHNIQUE: Multidetector CT imaging of the abdomen and pelvis was performed following the standard protocol without IV contrast.  COMPARISON:  07/17/2013  FINDINGS: There is pneumatosis of the cecum and ascending colon with a generous volume of air in the wall. There also is a little air which appears to extend toward the undersurface of the gallbladder and liver, perhaps in the hepatoduodenal ligament. There is a small bubble of air in the porta hepatis which may reside within the portal vein. No frank free intraperitoneal air is evident. Despite the rather generous volume of pneumatosis, there is no significant inflammatory change about the cecum or ascending colon. There is no bowel obstruction. Appendix is normal.  There are unremarkable unenhanced appearances of the liver, spleen, pancreas, adrenals and kidneys. Uterus and ovaries appear normal. There is no significant abnormality in the lower chest. There is no significant musculoskeletal abnormality.  IMPRESSION: Pneumatosis of the cecum and ascending colon. There are a few bubbles of air extending up toward the under surface of the gallbladder but the air does not appear to be free in the peritoneum. There also may be a bubble of air in the portal vein. Despite the generous volume of extraluminal air, there is little or no inflammatory change. There is no obstruction. There is no ascites. These results were called by telephone at the time of interpretation on 10/08/2014 at 2:51 am to Dr. Dina Rich, who verbally acknowledged these results.   Electronically Signed   By: Andreas Newport M.D.   On: 10/08/2014 02:57   Dg Chest 2 View  10/06/2014   CLINICAL DATA:  Abdominal pain and shortness of breath for 5 months.  EXAM: CHEST  2 VIEW  COMPARISON:  Chest radiograph 06/12/2014  FINDINGS: The heart is normal in size. Mild diffuse bronchial thickening.  Pulmonary vasculature is normal. No consolidation, pleural effusion, or pneumothorax. No acute osseous abnormalities are seen.  IMPRESSION: Mild diffuse bronchial thickening, can be seen with asthma or bronchitis.   Electronically Signed   By: Jeb Levering M.D.   On: 10/06/2014 21:29   Dg Abd 2 Views  10/06/2014   CLINICAL DATA:  Abdominal pain and diarrhea for 5 months.  EXAM: ABDOMEN - 2 VIEW  COMPARISON:  07/13/2014  FINDINGS: There is no free intra-abdominal air. No dilated bowel loops to suggest obstruction. Moderate volume of stool throughout the colon. No radiopaque calculi. Pelvic phleboliths and surgical clips projecting over the left inguinal region, unchanged. There is chronic widening of the pubic symphysis. No acute osseous abnormalities are seen.  IMPRESSION: Normal abdominal radiographs with moderate volume of colonic stool.   Electronically Signed   By: Jeb Levering M.D.   On: 10/06/2014 21:32    Review of Systems  Constitutional: Negative.   HENT: Negative.   Respiratory: Negative.   Cardiovascular: Negative.   Gastrointestinal: Positive for nausea, vomiting, abdominal pain, diarrhea and blood in stool.  Skin: Negative.   Neurological: Negative.    Blood pressure 128/73, pulse 70, temperature 97.8 F (36.6 C), temperature source Oral, resp. rate 16, height 5' 2"  (1.575 m), weight 77.111 kg (170 lb), last menstrual period 10/03/2014, SpO2 100 %, unknown if currently breastfeeding. Physical Exam  Constitutional: She is oriented to person, place, and  time. She appears well-developed and well-nourished.  HENT:  Head: Normocephalic and atraumatic.  Eyes: Conjunctivae and EOM are normal. Pupils are equal, round, and reactive to light.  Neck: Normal range of motion. Neck supple.  Cardiovascular: Normal rate, regular rhythm and normal heart sounds.   Respiratory: Effort normal and breath sounds normal.  GI: Soft. Bowel sounds are normal. She exhibits no distension. There is  tenderness (LLQ>LUQ, min TTP RLQ/RUQ). There is no rebound and no guarding.  Musculoskeletal: Normal range of motion.  Neurological: She is alert and oriented to person, place, and time.    Assessment/Plan: 23 year old female with pneumatosis of her cecum without any surrounding stranding or acute pathology. Patient's physical exam does not correlate with CT scan findings. At this point there is no free air or any signs of inflammation to the cecum. Patient does have a long history of diarrhea over the last several months, which could likely be infectious no need further workup.  At this time there is no indication for surgery. Would recommend medicine consultation. Feel free to call us for any further questions.  Rosario Jacks., Shakeria Robinette 10/08/2014, 6:07 AM

## 2014-10-08 NOTE — ED Notes (Signed)
Patient transported to CT 

## 2014-10-08 NOTE — H&P (Signed)
[facility] History and Physical                                                                                    Victoria Alvarez, is a 24 y.o. female  MRN: [id number]   DOB - [birth year]  Admit Date - 10/07/2014  Outpatient Primary MD for the patient is Lora Paula, MD  Referring Physician:  Emilia Beck, PA-C  Chief Complaint:   Chief Complaint  Patient presents with  . Abdominal Pain     HPI  Victoria Alvarez  is a 24 y.o. female, with seizures, interstitial cystitis, chronic pain, history of stroke on Eliquis and a history of substance abuse on methadone who presents to the emergency department on 7/28 and again on 7/29 with severe abdominal pain.  When I visited the patient she is pacing the room stating that she just cannot stand the pain anymore its worse than childbirth. She refused exam. The patient reports intermittent liquid stools for the past 4 months. Approximately 2 months ago she was told she was constipated and was advised to take 7 days of either milk of magnesia or Mira lax - the patient is unsure which, but from her med rec it appears she took the milk of magnesia. Over the last 2 days her abdominal pain has become intolerable. She is dry heaving. The pain is worse in the left upper quadrant but covers her entire stomach. It is constant and crampy. Eating makes the pain much worse. Apparently walking makes the pain better. The patient reports that she stopped taking all of her medications one week ago in order to determine if her medications were making her sick.  CT scan in the ER was read as pneumatosis of the cecum and ascending colon with an air bubble in the portal vein. General surgery was consulted, but did not feel surgery was appropriate at this time. [facility] was called to admit the patient for further workup.  Review of Systems   In addition to the HPI above,  No Fever-chills, No Headache, No changes with Vision or hearing, No Chest  pain, Cough or Shortness of Breath, No Blood in stool or Urine, No dysuria, No new skin rashes or bruises, No new joints pains-aches,  No new weakness, tingling, numbness in any extremity, A full 10 point Review of Systems was done, except as stated above, all other Review of Systems were negative.  Past Medical History  Past Medical History  Diagnosis Date  . Interstitial cystitis   . Chronic pelvic pain in female   . Headache(784.0)   . Infection     UTI  . Pyelonephritis affecting pregnancy in first trimester July 2015  . Depression 2007  . Substance abuse   . Pneumonia 03/2014  . Hemiparesis ~ 05/15/2014    RUE/RLE "worse in my arm"  . Seizures   . Stroke   . Numbness     right side  . Dizziness 07/18/14  . Confusion     Past Surgical History  Procedure Laterality Date  . Vagina surgery  ~ 2011    tumor removed, went to cancer center in [facility]  . Cesarean  section N/A 05/06/2014    Procedure: CESAREAN SECTION;  Surgeon: Levie Heritage, DO;  Location: [facility];  Service: Obstetrics;  Laterality: N/A;  . Inguinal hernia repair Left ~ 2014      Social History History  Substance Use Topics  . Smoking status: Current Every Day Smoker -- 0.50 packs/day for 8 years    Types: Cigarettes  . Smokeless tobacco: Never Used     Comment: cutting back  . Alcohol Use: No   has a history of narcotic abuse and is currently on methadone daily. Lives at home with her 71-month-old son and husband. Is independent with ADLs.  Family History Family History  Problem Relation Age of Onset  . Diabetes Mother   . Heart disease Mother   . Hypertension Father   . Diabetes Maternal Grandmother   . Hypertension Maternal Grandmother   . Stroke Maternal Grandmother   . Diabetes Maternal Grandfather   . Hypertension Maternal Grandfather   . Diabetes Paternal Grandmother   . Diabetes Paternal Grandfather   Aunt with Crohn's disease  Prior to Admission medications   Medication Sig Start Date  End Date Taking? Authorizing Provider  apixaban (ELIQUIS) 5 MG TABS tablet Take 1 tablet (5 mg total) by mouth 2 (two) times daily. 06/19/14 10/07/14 Yes Jaclyn Shaggy, MD  EPINEPHrine 0.3 mg/0.3 mL IJ SOAJ injection Inject 0.3 mLs (0.3 mg total) into the muscle once. 06/16/14  Yes Jaclyn Shaggy, MD  FLUoxetine (PROZAC) 20 MG capsule Take 1 capsule (20 mg total) by mouth daily. 07/18/14  Yes Micki Riley, MD  levETIRAcetam (KEPPRA) 500 MG tablet Take 1 tablet (500 mg total) by mouth 2 (two) times daily. 06/13/14  Yes Richarda Overlie, MD    Allergies  Allergen Reactions  . Ativan [Lorazepam] Other (See Comments)    " I don't remember anything."  . Bee Venom Swelling  . Ciprofloxacin Diarrhea and Nausea And Vomiting    Physical Exam  Vitals  Blood pressure 129/71, pulse 65, temperature 99.8 F (37.7 C), temperature source Oral, resp. rate 18, height 5\' 2"  (1.575 m), weight 77.111 kg (170 lb), last menstrual period 10/03/2014, SpO2 100 %, unknown if currently breastfeeding.   General: Well-developed, overweight female, pacing the room in moderate distress due to pain.  Psych:  frustrated, alert, oriented  Neuro:   No F.N deficits, ALL C.Nerves Intact, Strength 5/5 all 4 extremities, Sensation intact all 4 extremities.  ENT:  Ears and Eyes appear Normal, Conjunctivae clear, PER. Moist oral mucosa without erythema or exudates.  Neck:  Supple, No lymphadenopathy appreciated  Respiratory:  Symmetrical chest wall movement, Good air movement bilaterally, CTAB.  Cardiac:  RRR, No Murmurs, no LE edema noted, no JVD.    Abdomen:  Patient would not agree to lie down for an abdominal exam. Distended, tender to palpation particularly in the left, I do not hear bowel sounds.  Skin:  No Cyanosis, Normal Skin Turgor, No Skin Rash or Bruise.  Extremities:  Able to move all 4. 5/5 strength in each,  no effusions.  Data Review  CBC  Recent Labs Lab 10/06/14 2136 10/07/14 2125  WBC 5.7 7.3  HGB  9.2* 10.2*  HCT 31.0* 33.7*  PLT 226 215  MCV 64.6* 64.6*  MCH 19.2* 19.5*  MCHC 29.7* 30.3  RDW 20.4* 20.6*  LYMPHSABS 2.6 1.0  MONOABS 0.4 0.3  EOSABS 0.1 0.0  BASOSABS 0.1 0.0    Chemistries   Recent Labs Lab 10/06/14 1751 10/07/14 2125  NA  138 139  K 4.2 3.6  CL 105 106  CO2 22 22  GLUCOSE 88 109*  BUN 14 10  CREATININE 0.69 0.80  CALCIUM 9.6 9.5  AST 37 38  ALT 45 47  ALKPHOS 62 51  BILITOT 0.4 0.5     Urinalysis    Component Value Date/Time   COLORURINE YELLOW 10/07/2014 2305   APPEARANCEUR CLEAR 10/07/2014 2305   LABSPEC 1.020 10/07/2014 2305   PHURINE 6.0 10/07/2014 2305   GLUCOSEU NEGATIVE 10/07/2014 2305   HGBUR NEGATIVE 10/07/2014 2305   BILIRUBINUR NEGATIVE 10/07/2014 2305   KETONESUR NEGATIVE 10/07/2014 2305   PROTEINUR NEGATIVE 10/07/2014 2305   UROBILINOGEN 0.2 10/07/2014 2305   NITRITE NEGATIVE 10/07/2014 2305   LEUKOCYTESUR NEGATIVE 10/07/2014 2305    Imaging results:   Ct Abdomen Pelvis Wo Contrast  10/08/2014   CLINICAL DATA:  Generalized abdominal pain, greatest in the left lower quadrant.  EXAM: CT ABDOMEN AND PELVIS WITHOUT CONTRAST  TECHNIQUE: Multidetector CT imaging of the abdomen and pelvis was performed following the standard protocol without IV contrast.  COMPARISON:  07/17/2013  FINDINGS: There is pneumatosis of the cecum and ascending colon with a generous volume of air in the wall. There also is a little air which appears to extend toward the undersurface of the gallbladder and liver, perhaps in the hepatoduodenal ligament. There is a small bubble of air in the porta hepatis which may reside within the portal vein. No frank free intraperitoneal air is evident. Despite the rather generous volume of pneumatosis, there is no significant inflammatory change about the cecum or ascending colon. There is no bowel obstruction. Appendix is normal.  There are unremarkable unenhanced appearances of the liver, spleen, pancreas, adrenals and  kidneys. Uterus and ovaries appear normal. There is no significant abnormality in the lower chest. There is no significant musculoskeletal abnormality.  IMPRESSION: Pneumatosis of the cecum and ascending colon. There are a few bubbles of air extending up toward the under surface of the gallbladder but the air does not appear to be free in the peritoneum. There also may be a bubble of air in the portal vein. Despite the generous volume of extraluminal air, there is little or no inflammatory change. There is no obstruction. There is no ascites. These results were called by telephone at the time of interpretation on 10/08/2014 at 2:51 am to Dr. Wilkie Aye, who verbally acknowledged these results.   Electronically Signed   By: Ellery Plunk M.D.   On: 10/08/2014 02:57   Dg Chest 2 View  10/06/2014   CLINICAL DATA:  Abdominal pain and shortness of breath for 5 months.  EXAM: CHEST  2 VIEW  COMPARISON:  Chest radiograph 06/12/2014  FINDINGS: The heart is normal in size. Mild diffuse bronchial thickening. Pulmonary vasculature is normal. No consolidation, pleural effusion, or pneumothorax. No acute osseous abnormalities are seen.  IMPRESSION: Mild diffuse bronchial thickening, can be seen with asthma or bronchitis.   Electronically Signed   By: Rubye Oaks M.D.   On: 10/06/2014 21:29   Dg Abd 2 Views  10/06/2014   CLINICAL DATA:  Abdominal pain and diarrhea for 5 months.  EXAM: ABDOMEN - 2 VIEW  COMPARISON:  07/13/2014  FINDINGS: There is no free intra-abdominal air. No dilated bowel loops to suggest obstruction. Moderate volume of stool throughout the colon. No radiopaque calculi. Pelvic phleboliths and surgical clips projecting over the left inguinal region, unchanged. There is chronic widening of the pubic symphysis. No acute osseous abnormalities are  seen.  IMPRESSION: Normal abdominal radiographs with moderate volume of colonic stool.   Electronically Signed   By: Rubye Oaks M.D.   On: 10/06/2014 21:32     My personal review of EKG: NSR, No ST changes noted.   Assessment & Plan  Principal Problem:   Pneumatosis of intestines Active Problems:   Current smoker   Anemia, iron deficiency   Seizure   Methadone maintenance therapy patient    Pneumatosis of the cecum with significant constipation I discussed the CT results with Dr. Rito Ehrlich of radiology in detail. He feels this may be a benign pneumatosis. A soft call. Largely it is significant constipation. The cecum is not tremendously dilated. There is no frank perforation.  Surgery felt there was no role for surgical intervention.  She received a one-time dose of Zosyn in the ER. I will not continue antibiotics at this point as she does not appear to have an infection. White count is not elevated. She is afebrile.  Will place the patient on clear liquids and administer a bowel prep.  Follow-up abdominal x-ray on 7/31.  Recommend a daily bowel regimen at discharge.  As she is a chronic methadone patient I will discuss with [facility] via the telephone whether or not she may be a candidate for naloxegol.  If pain is not relieved after bowel prep would formally consult GI as patient has an aunt with Crohn's.  History of CVA Prescribed eliquis. Stopped taking it one week ago. Will place on heparin, and restart eliquis per pharmacy.   History of seizure Stopped taking keppra 1 week ago. Will restart keppra IV as the patient is dry heaving  Chronic pelvic pain On Methadone.   I called the [facility] clinic in [facility] and confirmed that she receives 84 mg daily. I will continue this dose (85 mg) as an inpatient.  Tobacco abuse Nicotine patch  Consultants Called:  General surgery evaluated the patient prior to admission. The patient has been discussed [facility], but no formal GI consultation yet.  Family Communication:   Husband bedside  Code Status:  Full code  Condition:  Guarded  Potential Disposition: To home once pain is  improved.  Time spent in minutes : [address]  PA-C on 10/08/2014 at 8:28 AM Between 7am to 7pm - Pager - [phone] After 7pm go to [email or web address] - password [email or web address] And look for the night coverage person covering me after hours  [facility]

## 2014-10-08 NOTE — ED Notes (Signed)
Pt asks for something for pain, Dr Wilkie Aye made aware

## 2014-10-08 NOTE — Progress Notes (Signed)
Patient trasfered from ED to 5W21 via bed; alert and oriented x 4;  complaints of abdominal  Pain (8/10) - patient received scheduled pain medication; IV in RFA; skin intact (was checked by nurse Adela Ports and nurse Edgardo Roys). Orient patient to room and unit; gave patient care guide; instructed how to use the call bell and  fall risk precautions. Will continue to monitor the patient.

## 2014-10-09 ENCOUNTER — Inpatient Hospital Stay (HOSPITAL_COMMUNITY): Payer: Medicaid Other

## 2014-10-09 DIAGNOSIS — K6389 Other specified diseases of intestine: Secondary | ICD-10-CM

## 2014-10-09 DIAGNOSIS — K921 Melena: Secondary | ICD-10-CM

## 2014-10-09 DIAGNOSIS — K5900 Constipation, unspecified: Secondary | ICD-10-CM

## 2014-10-09 DIAGNOSIS — R1030 Lower abdominal pain, unspecified: Secondary | ICD-10-CM

## 2014-10-09 LAB — CBC
HCT: 30.5 % — ABNORMAL LOW (ref 36.0–46.0)
Hemoglobin: 9 g/dL — ABNORMAL LOW (ref 12.0–15.0)
MCH: 19.1 pg — ABNORMAL LOW (ref 26.0–34.0)
MCHC: 29.5 g/dL — ABNORMAL LOW (ref 30.0–36.0)
MCV: 64.8 fL — ABNORMAL LOW (ref 78.0–100.0)
Platelets: 201 10*3/uL (ref 150–400)
RBC: 4.71 MIL/uL (ref 3.87–5.11)
RDW: 20.6 % — ABNORMAL HIGH (ref 11.5–15.5)
WBC: 10.9 10*3/uL — ABNORMAL HIGH (ref 4.0–10.5)

## 2014-10-09 LAB — BASIC METABOLIC PANEL
Anion gap: 7 (ref 5–15)
BUN: 9 mg/dL (ref 6–20)
CO2: 24 mmol/L (ref 22–32)
Calcium: 7.9 mg/dL — ABNORMAL LOW (ref 8.9–10.3)
Chloride: 106 mmol/L (ref 101–111)
Creatinine, Ser: 0.87 mg/dL (ref 0.44–1.00)
GFR calc Af Amer: >60 mL/min (ref >60)
GFR calc non Af Amer: >60 mL/min (ref >60)
Glucose, Bld: 113 mg/dL — ABNORMAL HIGH (ref 65–99)
Potassium: 3.3 mmol/L — ABNORMAL LOW (ref 3.5–5.1)
Sodium: 137 mmol/L (ref 135–145)

## 2014-10-09 LAB — TYPE AND SCREEN
ABO/RH(D): O NEG
Antibody Screen: NEGATIVE

## 2014-10-09 LAB — HEMOGLOBIN AND HEMATOCRIT, BLOOD
HCT: 31.2 % — ABNORMAL LOW (ref 36.0–46.0)
Hemoglobin: 9 g/dL — ABNORMAL LOW (ref 12.0–15.0)

## 2014-10-09 MED ORDER — POTASSIUM CHLORIDE CRYS ER 20 MEQ PO TBCR
40.0000 meq | EXTENDED_RELEASE_TABLET | ORAL | Status: AC
  Administered 2014-10-09 (×2): 40 meq via ORAL
  Filled 2014-10-09 (×2): qty 2

## 2014-10-09 MED ORDER — NALOXEGOL OXALATE 12.5 MG PO TABS
12.5000 mg | ORAL_TABLET | Freq: Every day | ORAL | Status: DC
  Filled 2014-10-09: qty 1

## 2014-10-09 MED ORDER — PEG-KCL-NACL-NASULF-NA ASC-C 100 G PO SOLR
0.5000 | Freq: Once | ORAL | Status: AC
  Administered 2014-10-10: 100 g via ORAL
  Filled 2014-10-09: qty 1

## 2014-10-09 MED ORDER — APIXABAN 5 MG PO TABS: 5.0000 mg | ORAL_TABLET | Freq: Two times a day (BID) | ORAL | Status: DC

## 2014-10-09 MED ORDER — METHYLNALTREXONE BROMIDE 12 MG/0.6ML ~~LOC~~ SOLN
12.0000 mg | SUBCUTANEOUS | Status: DC
  Administered 2014-10-09: 12 mg via SUBCUTANEOUS
  Filled 2014-10-09 (×3): qty 0.6

## 2014-10-09 MED ORDER — MORPHINE SULFATE 2 MG/ML IJ SOLN
2.0000 mg | Freq: Once | INTRAMUSCULAR | Status: AC
  Administered 2014-10-09: 2 mg via INTRAVENOUS
  Filled 2014-10-09: qty 1

## 2014-10-09 MED ORDER — SORBITOL 70 % SOLN
960.0000 mL | TOPICAL_OIL | Freq: Once | ORAL | Status: AC
  Administered 2014-10-09: 960 mL via RECTAL
  Filled 2014-10-09: qty 240

## 2014-10-09 MED ORDER — BISACODYL 10 MG RE SUPP
10.0000 mg | Freq: Every day | RECTAL | Status: DC
  Administered 2014-10-09 – 2014-10-10 (×2): 10 mg via RECTAL
  Filled 2014-10-09 (×2): qty 1

## 2014-10-09 MED ORDER — PEG-KCL-NACL-NASULF-NA ASC-C 100 G PO SOLR: 1.0000 | Freq: Once | ORAL | Status: DC

## 2014-10-09 MED ORDER — MORPHINE SULFATE 2 MG/ML IJ SOLN: 2.0000 mg | Freq: Once | INTRAMUSCULAR | Status: DC

## 2014-10-09 MED ORDER — PEG-KCL-NACL-NASULF-NA ASC-C 100 G PO SOLR
0.5000 | Freq: Once | ORAL | Status: AC
  Administered 2014-10-09: 100 g via ORAL
  Filled 2014-10-09: qty 1

## 2014-10-09 NOTE — Consult Note (Signed)
Referring Provider:  [facility] Primary Care Physician:  Lora Paula, MD Primary Gastroenterologist:  unassigned  Reason for Consultation:  Hematochezia, abdominal pain, constipation    HPI: Marypat Miedema is a 24 y.o. 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 She was seen in the emergency room on July 28 with a four-month history of diarrhea, nausea, and decreased appetite. Patient reports that she has been constipated for most of her life and typically only moves her bowels once a week. In early May she had an impaction and was treated with a bowel purge including magnesia citrate, milk of magnesia, and Miralax. Since that time she complains of increased bloating and says she has gained weight. Abdominal films on July 28 showed a moderate volume stool throughout the colon. Digital rectal exam had heme-negative stool. She was sent home. She returned to the emergency room on July 29 with complaints of left lower quadrant pain that was achy and severe. CT showed pneumatosis of the cecum and ascending colon. She was evaluated by surgery and it was felt she did not have a surgical problem. At the time of admission patient had been off of her pelvic was for several days due to her menses. She was admitted for IV fluids and bowel rest and analgesia. This morning she has had several bowel movements that have had bright red blood. She has no rectal pain, itching, burning. She has no sensation of incomplete evacuation. She complains of diffuse lower abdominal discomfort. She states that Thursday she had a bowel movement that had bloody spotting on the toilet tissue.  Past  Medical History  Diagnosis Date  . Interstitial cystitis   . Chronic pelvic pain in female   . Headache(784.0)   . Infection     UTI  . Pyelonephritis affecting pregnancy in first trimester July 2015  . Depression 2007  . Substance abuse   . Pneumonia 03/2014  . Hemiparesis ~ 05/15/2014    RUE/RLE "worse in my arm"  . Seizures   . Stroke   . Numbness     right side  . Dizziness 07/18/14  . Confusion     Past Surgical History  Procedure Laterality Date  . Vagina surgery  ~ 2011    tumor removed, went to cancer center in [facility]  . Cesarean section N/A 05/06/2014    Procedure: CESAREAN SECTION;  Surgeon: Levie Heritage, DO;  Location: [facility];  Service: Obstetrics;  Laterality: N/A;  . Inguinal hernia repair Left ~ 2014    Prior to Admission medications   Medication Sig Start Date End Date Taking? Authorizing Provider  apixaban (ELIQUIS) 5 MG TABS tablet Take 1 tablet (5 mg total) by mouth 2 (two) times daily. 06/19/14 10/07/14 Yes Jaclyn Shaggy, MD  EPINEPHrine 0.3 mg/0.3 mL IJ SOAJ injection Inject 0.3 mLs (0.3 mg total) into the muscle once. 06/16/14  Yes Jaclyn Shaggy, MD  FLUoxetine (PROZAC) 20 MG capsule Take 1 capsule (20 mg total) by mouth daily. 07/18/14  Yes Micki Riley, MD  levETIRAcetam (KEPPRA) 500 MG tablet Take 1 tablet (500 mg total) by mouth 2 (two) times daily. 06/13/14  Yes Richarda Overlie, MD    Current Facility-Administered Medications  Medication Dose Route Frequency Provider Last Rate Last Dose  .  0.9 % NaCl with KCl 20 mEq/ L  infusion   Intravenous Continuous Tora Kindred York, PA-C 100 mL/hr at 10/09/14 1048 1,000 mL at 10/09/14 1048  . acetaminophen (TYLENOL) tablet 650 mg  650 mg Oral Q6H PRN Stephani Police, PA-C       Or  . acetaminophen (TYLENOL) suppository 650 mg  650 mg Rectal Q6H PRN Stephani Police, PA-C      . alum & mag hydroxide-simeth (MAALOX/MYLANTA) 200-200-20 MG/5ML suspension 30 mL  30 mL Oral Q6H PRN Stephani Police, PA-C      . [START ON 10/11/2014]  apixaban (ELIQUIS) tablet 5 mg  5 mg Oral BID Leroy Sea, MD      . bisacodyl (DULCOLAX) suppository 10 mg  10 mg Rectal Daily Leroy Sea, MD   10 mg at 10/09/14 1129  . FLUoxetine (PROZAC) capsule 20 mg  20 mg Oral Daily Stephani Police, PA-C   20 mg at 10/09/14 0959  . ketorolac (TORADOL) 30 MG/ML injection 30 mg  30 mg Intravenous Q6H PRN Stephani Police, PA-C   30 mg at 10/09/14 0725  . levETIRAcetam (KEPPRA) 500 mg in sodium chloride 0.9 % 100 mL IVPB  500 mg Intravenous Q12H Stephani Police, PA-C   500 mg at 10/09/14 0959  . methadone (DOLOPHINE) tablet 85 mg  85 mg Oral Daily Stephani Police, PA-C   85 mg at 10/09/14 7846  . methylnaltrexone (RELISTOR) injection 12 mg  12 mg Subcutaneous QODAY Leroy Sea, MD   12 mg at 10/09/14 1046  . metoCLOPramide (REGLAN) injection 10 mg  10 mg Intravenous Q6H PRN Stephani Police, PA-C      . morphine 2 MG/ML injection 1 mg  1 mg Intravenous Q4H PRN Dorothea Ogle, MD   1 mg at 10/09/14 1045  . nicotine (NICODERM CQ - dosed in mg/24 hours) patch 21 mg  21 mg Transdermal Daily Stephani Police, PA-C   21 mg at 10/09/14 1000  . ondansetron (ZOFRAN) tablet 4 mg  4 mg Oral Q6H PRN Stephani Police, PA-C       Or  . ondansetron (ZOFRAN) injection 4 mg  4 mg Intravenous Q6H PRN Stephani Police, PA-C   4 mg at 10/08/14 2357  . polyethylene glycol powder (GLYCOLAX/MIRALAX) container 255 g  1 Container Oral Once Stephani Police, PA-C   255 g at 10/08/14 9629  . potassium chloride SA (K-DUR,KLOR-CON) CR tablet 40 mEq  40 mEq Oral Q4H Leroy Sea, MD   40 mEq at 10/09/14 1129    Allergies as of 10/07/2014 - Review Complete 10/07/2014  Allergen Reaction Noted  . Ativan [lorazepam] Other (See Comments) 07/18/2014  . Bee venom Swelling 06/16/2014  . Ciprofloxacin Diarrhea and Nausea And Vomiting 07/10/2012    Family History  Problem Relation Age of Onset  . Diabetes Mother   . Heart disease Mother   . Hypertension Father   . Diabetes  Maternal Grandmother   . Hypertension Maternal Grandmother   . Stroke Maternal Grandmother   . Diabetes Maternal Grandfather   . Hypertension Maternal Grandfather   . Diabetes Paternal Grandmother   . Diabetes Paternal Grandfather     History   Social History  . Marital Status: Single    Spouse Name: N/A  . Number of Children: 2  . Years of Education: 9   Occupational History  . homemaker    Social History Main Topics  .  Smoking status: Current Every Day Smoker -- 0.50 packs/day for 8 years    Types: Cigarettes  . Smokeless tobacco: Never Used     Comment: cutting back  . Alcohol Use: No  . Drug Use: No     Comment: Pt on Methadone "was on pain pills; weaned off while I was pregnant so it would be safer"  . Sexual Activity: Yes    Birth Control/ Protection: None   Other Topics Concern  . Not on file   Social History Narrative   Has significant other   Right handed   Caffeine use - none    Review of Systems: Gen: Denies any fever, chills, sweats, anorexia CV: Denies chest pain, angina, palpitations, syncope, orthopnea, PND, peripheral edema, and claudication. Resp: Denies dyspnea at rest, dyspnea with exercise, cough, sputum, wheezing, coughing up blood, and pleurisy. GI: Denies vomiting blood, jaundice, and fecal incontinence.   Denies dysphagia or odynophagia. Had rectal bleeding this morning. GU : Denies urinary burning, blood in urine, urinary frequency, urinary hesitancy, nocturnal urination, and urinary incontinence. MS: Denies joint pain Derm: Denies rash, itching, dry skin, hives, moles, warts, or unhealing ulcers.  Psych: Denies depression, anxiety, memory loss, suicidal ideation, hallucinations, paranoia, and confusion. Heme: Denies bruising, bleeding, and enlarged lymph nodes.   Physical Exam: Vital signs in last 24 hours: Temp:  [98.7 F (37.1 C)-99 F (37.2 C)] 99 F (37.2 C) (07/31 0505) Pulse Rate:  [60-64] 64 (07/31 0505) Resp:  [20] 20 (07/31  0505) BP: (110-156)/(57-92) 156/92 mmHg (07/31 0505) SpO2:  [97 %-100 %] 100 % (07/31 0505) Last BM Date: 10/08/14 General:   Alert,  Well-developed, well-nourished, tearful. Head:  Normocephalic and atraumatic. Eyes:  Sclera clear, no icterus Conjunctiva pink. Ears:  Normal auditory acuity. Nose:  No deformity, discharge,  or lesions. Mouth:  No deformity or lesions.   Neck:  Supple; no masses or thyromegaly. Lungs:  Clear throughout to auscultation.     Heart:  Regular rate and rhythm Abdomen:  Soft,mild diffuse lower abd painBS active,nonpalp mass or hsm.   Rectal:  Small external hemorroids, gloved finger with red blood Msk:  Symmetrical without gross deformities. . Pulses:  Normal pulses noted. Extremities:  Without clubbing or edema. Neurologic: Alert and  oriented x4;  grossly normal neurologically. Skin:  Intact without significant lesions or rashes.. Psych:  Alert and cooperative. Normal mood and affect.  Intake/Output from previous day: 07/30 0701 - 07/31 0700 In: 2813.3 [P.O.:600; I.V.:2108.3; IV Piggyback:105] Out: 1600 [Urine:1600] Intake/Output this shift: Total I/O In: 240 [P.O.:240] Out: -   Lab Results:  Recent Labs  10/06/14 2136 10/07/14 2125 10/09/14 0633  WBC 5.7 7.3 10.9*  HGB 9.2* 10.2* 9.0*  HCT 31.0* 33.7* 30.5*  PLT 226 215 201   BMET  Recent Labs  10/06/14 1751 10/07/14 2125 10/09/14 0633  NA 138 139 137  K 4.2 3.6 3.3*  CL 105 106 106  CO2 22 22 24   GLUCOSE 88 109* 113*  BUN 14 10 9   CREATININE 0.69 0.80 0.87  CALCIUM 9.6 9.5 7.9*   LFT  Recent Labs  10/07/14 2125  PROT 6.3*  ALBUMIN 3.8  AST 38  ALT 47  ALKPHOS 51  BILITOT 0.5      Studies/Results: Ct Abdomen Pelvis Wo Contrast  10/08/2014   CLINICAL DATA:  Generalized abdominal pain, greatest in the left lower quadrant.  EXAM: CT ABDOMEN AND PELVIS WITHOUT CONTRAST  TECHNIQUE: Multidetector CT imaging of the abdomen and pelvis was  performed following the  standard protocol without IV contrast.  COMPARISON:  07/17/2013  FINDINGS: There is pneumatosis of the cecum and ascending colon with a generous volume of air in the wall. There also is a little air which appears to extend toward the undersurface of the gallbladder and liver, perhaps in the hepatoduodenal ligament. There is a small bubble of air in the porta hepatis which may reside within the portal vein. No frank free intraperitoneal air is evident. Despite the rather generous volume of pneumatosis, there is no significant inflammatory change about the cecum or ascending colon. There is no bowel obstruction. Appendix is normal.  There are unremarkable unenhanced appearances of the liver, spleen, pancreas, adrenals and kidneys. Uterus and ovaries appear normal. There is no significant abnormality in the lower chest. There is no significant musculoskeletal abnormality.  IMPRESSION: Pneumatosis of the cecum and ascending colon. There are a few bubbles of air extending up toward the under surface of the gallbladder but the air does not appear to be free in the peritoneum. There also may be a bubble of air in the portal vein. Despite the generous volume of extraluminal air, there is little or no inflammatory change. There is no obstruction. There is no ascites. These results were called by telephone at the time of interpretation on 10/08/2014 at 2:51 am to Dr. Wilkie Aye, who verbally acknowledged these results.   Electronically Signed   By: Ellery Plunk M.D.   On: 10/08/2014 02:57   Dg Abd 2 Views  10/09/2014   CLINICAL DATA:  Generalized abdominal pain. Nausea vomiting diarrhea for 2 days  EXAM: ABDOMEN - 2 VIEW  COMPARISON:  CT 10/06/2014, radiograph 10/06/2014  FINDINGS: The oral contrast from CT of 10/06/2014 has progressed into the rectum. There is mild decrease in the volume of stool seen on comparison exam. No dilated loops of large or small bowel. NO intraoperative free air.  IMPRESSION: No evidence of  bowel obstruction. Progression of oral contrast the rectum from CT of 10/06/2014.   Electronically Signed   By: Genevive Bi M.D.   On: 10/09/2014 09:33    IMPRESSIONPLAN: 24 year old female admitted with lower abdominal pain. Patient has a history of chronic pelvic pain, seizure, CVA, and has been on Eliquis which she discontinued a week ago during her menses. She did receive 2 doses of Eliquis yesterday and one this morning. Abdominal pain likely secondary to significant constipation. Hematochezia may be due to internal hemorrhoids or possible proctitis/colitis. Will plan bowel purge and smog enema today, and reassess in the morning has to possible flexible sigmoidoscopy. Follow H&H.   Hvozdovic, Moise Boring 10/09/2014,  Pager [phone]      Attending physician's note   I have taken a history, examined the patient and reviewed the chart. I agree with the Advanced Practitioner's note, impression and recommendations. Abdominal pain appears to be secondary to constipation. Pneumatosis of cecum and asc colon on CT likely benign finding. The CT was reviewed by Gen Surgery and Radiology-there is not felt to be any air in the porta hepatis or gallbladder. Only abnl air is the pneumatosis. Rectal bleeding on Eliquis could be hemorrhoidal, stercoral ulcer, etc. Flex sig or colonoscopy after Eliquis wash out. Bowel purge and enemas today.   Meryl Dare, MD Clementeen Graham [phone] pager Mon-Fri 8a-5p [phone] weekends, holidays and 5p-8a or per [facility]

## 2014-10-09 NOTE — Progress Notes (Addendum)
Patient complains of constant pain 10/10 over abdomen, not under control with PRN Morphine 1mg  IV q4h and PRN Toradol 30 mg IV q6h. Floor coverage [place] MD paged and notified. Kendall Flack 10/09/2014 4:23 AM   Harduk MD placed order for Morphine 2mg  IV once. Will carry out order.  Kendall Flack 10/09/2014 4:42 AM

## 2014-10-09 NOTE — Progress Notes (Addendum)
Patient Demographics:    Victoria Alvarez, is a 24 y.o. female, DOB - Jun 17, 1990, HBZ:[id number]  Admit date - 10/07/2014   Admitting Physician Theodis Blaze, MD  Outpatient Primary MD for the patient is Minerva Ends, MD  LOS - 1   Chief Complaint  Patient presents with  . Abdominal Pain        Subjective:    Victoria Alvarez today has, No headache, No chest pain, No abdominal pain - No Nausea, No new weakness tingling or numbness, No Cough - SOB.     Assessment  & Plan :     1. Abdominal pain due to massive stool burden from narcotic bowel. Question raised for Pneumatosis of the cecum and ascending colon on CT scan, seen by general surgery who clinically improved it out. Placed on bowel regimen including Relistor. Counseled not to overuse methadone. Abdominal exam benign.   2. History of seizure. Continue home regimen.   3. Smoker. Counseled to quit   4. Chronic pain. On methadone. Requested to taper off gradually.   5. Hypokalemia. Replaced.   6.H/O CVA - on Eliquis, had stopped it for 5 days.     Code Status : Full  Family Communication  : Stable  Disposition Plan  : Home 1-2 days  Consults  :  Surgery  Procedures  : CT Abd Pelvis - stool, ? Pneumatosis of the cecum and ascending colon which was ruled out clinically by surgery  DVT Prophylaxis  :  Eliquis  Lab Results  Component Value Date   PLT 201 10/09/2014    Inpatient Medications  Scheduled Meds: . apixaban  5 mg Oral BID  . FLUoxetine  20 mg Oral Daily  . levETIRAcetam  500 mg Intravenous Q12H  . methadone  85 mg Oral Daily  . methylnaltrexone  12 mg Subcutaneous QODAY  . nicotine  21 mg Transdermal Daily  . polyethylene glycol powder  1 Container Oral Once   Continuous Infusions: . 0.9 % NaCl with  KCl 20 mEq / L 100 mL/hr at 10/09/14 0502   PRN Meds:.acetaminophen **OR** acetaminophen, alum & mag hydroxide-simeth, ketorolac, metoCLOPramide (REGLAN) injection, morphine injection, ondansetron **OR** ondansetron (ZOFRAN) IV  Antibiotics  :     Anti-infectives    Start     Dose/Rate Route Frequency Ordered Stop   10/08/14 0645  piperacillin-tazobactam (ZOSYN) IVPB 3.375 g     3.375 g 100 mL/hr over 30 Minutes Intravenous  Once 10/08/14 0630 10/08/14 0737        Objective:   Filed Vitals:   10/08/14 0805 10/08/14 1414 10/08/14 2133 10/09/14 0505  BP: 129/71 125/75 110/57 156/92  Pulse: 65 63 60 64  Temp: 99.8 F (37.7 C) 98.9 F (37.2 C) 98.7 F (37.1 C) 99 F (37.2 C)  TempSrc: Oral Oral Oral Oral  Resp:  20 20 20   Height:      Weight:      SpO2: 100% 98% 97% 100%    Wt Readings from Last 3 Encounters:  10/07/14 77.111 kg (170 lb)  08/05/14 77.111 kg (170 lb)  07/18/14 84.369 kg (186 lb)     Intake/Output Summary (Last 24 hours) at 10/09/14 1046 Last data filed at 10/09/14 1000  Gross per 24  hour  Intake 2933.33 ml  Output   1600 ml  Net 1333.33 ml     Physical Exam  Awake Alert, Oriented X 3, No new F.N deficits, Normal affect [place].AT,PERRAL Supple Neck,No JVD, No cervical lymphadenopathy appriciated.  Symmetrical Chest wall movement, Good air movement bilaterally, CTAB RRR,No Gallops,Rubs or new Murmurs, No Parasternal Heave +ve B.Sounds, Abd Soft, No tenderness, No organomegaly appriciated, No rebound - guarding or rigidity. No Cyanosis, Clubbing or edema, No new Rash or bruise     Data Review:   Micro Results No results found for this or any previous visit (from the past 240 hour(s)).  Radiology Reports Ct Abdomen Pelvis Wo Contrast  10/08/2014   CLINICAL DATA:  Generalized abdominal pain, greatest in the left lower quadrant.  EXAM: CT ABDOMEN AND PELVIS WITHOUT CONTRAST  TECHNIQUE: Multidetector CT imaging of the abdomen and pelvis was performed  following the standard protocol without IV contrast.  COMPARISON:  07/17/2013  FINDINGS: There is pneumatosis of the cecum and ascending colon with a generous volume of air in the wall. There also is a little air which appears to extend toward the undersurface of the gallbladder and liver, perhaps in the hepatoduodenal ligament. There is a small bubble of air in the porta hepatis which may reside within the portal vein. No frank free intraperitoneal air is evident. Despite the rather generous volume of pneumatosis, there is no significant inflammatory change about the cecum or ascending colon. There is no bowel obstruction. Appendix is normal.  There are unremarkable unenhanced appearances of the liver, spleen, pancreas, adrenals and kidneys. Uterus and ovaries appear normal. There is no significant abnormality in the lower chest. There is no significant musculoskeletal abnormality.  IMPRESSION: Pneumatosis of the cecum and ascending colon. There are a few bubbles of air extending up toward the under surface of the gallbladder but the air does not appear to be free in the peritoneum. There also may be a bubble of air in the portal vein. Despite the generous volume of extraluminal air, there is little or no inflammatory change. There is no obstruction. There is no ascites. These results were called by telephone at the time of interpretation on 10/08/2014 at 2:51 am to Dr. Dina Rich, who verbally acknowledged these results.   Electronically Signed   By: Andreas Newport M.D.   On: 10/08/2014 02:57   Dg Chest 2 View  10/06/2014   CLINICAL DATA:  Abdominal pain and shortness of breath for 5 months.  EXAM: CHEST  2 VIEW  COMPARISON:  Chest radiograph 06/12/2014  FINDINGS: The heart is normal in size. Mild diffuse bronchial thickening. Pulmonary vasculature is normal. No consolidation, pleural effusion, or pneumothorax. No acute osseous abnormalities are seen.  IMPRESSION: Mild diffuse bronchial thickening, can be seen  with asthma or bronchitis.   Electronically Signed   By: Jeb Levering M.D.   On: 10/06/2014 21:29   Dg Abd 2 Views  10/09/2014   CLINICAL DATA:  Generalized abdominal pain. Nausea vomiting diarrhea for 2 days  EXAM: ABDOMEN - 2 VIEW  COMPARISON:  CT 10/06/2014, radiograph 10/06/2014  FINDINGS: The oral contrast from CT of 10/06/2014 has progressed into the rectum. There is mild decrease in the volume of stool seen on comparison exam. No dilated loops of large or small bowel. NO intraoperative free air.  IMPRESSION: No evidence of bowel obstruction. Progression of oral contrast the rectum from CT of 10/06/2014.   Electronically Signed   By: Helane Gunther.D.  On: 10/09/2014 09:33   Dg Abd 2 Views  10/06/2014   CLINICAL DATA:  Abdominal pain and diarrhea for 5 months.  EXAM: ABDOMEN - 2 VIEW  COMPARISON:  07/13/2014  FINDINGS: There is no free intra-abdominal air. No dilated bowel loops to suggest obstruction. Moderate volume of stool throughout the colon. No radiopaque calculi. Pelvic phleboliths and surgical clips projecting over the left inguinal region, unchanged. There is chronic widening of the pubic symphysis. No acute osseous abnormalities are seen.  IMPRESSION: Normal abdominal radiographs with moderate volume of colonic stool.   Electronically Signed   By: Jeb Levering M.D.   On: 10/06/2014 21:32     CBC  Recent Labs Lab 10/06/14 2136 10/07/14 2125 10/09/14 0633  WBC 5.7 7.3 10.9*  HGB 9.2* 10.2* 9.0*  HCT 31.0* 33.7* 30.5*  PLT 226 215 201  MCV 64.6* 64.6* 64.8*  MCH 19.2* 19.5* 19.1*  MCHC 29.7* 30.3 29.5*  RDW 20.4* 20.6* 20.6*  LYMPHSABS 2.6 1.0  --   MONOABS 0.4 0.3  --   EOSABS 0.1 0.0  --   BASOSABS 0.1 0.0  --     Chemistries   Recent Labs Lab 10/06/14 1751 10/07/14 2125 10/09/14 0633  NA 138 139 137  K 4.2 3.6 3.3*  CL 105 106 106  CO2 22 22 24   GLUCOSE 88 109* 113*  BUN 14 10 9   CREATININE 0.69 0.80 0.87  CALCIUM 9.6 9.5 7.9*  AST 37 38  --    ALT 45 47  --   ALKPHOS 62 51  --   BILITOT 0.4 0.5  --    ------------------------------------------------------------------------------------------------------------------ estimated creatinine clearance is 95 mL/min (by C-G formula based on Cr of 0.87). ------------------------------------------------------------------------------------------------------------------ No results for input(s): HGBA1C in the last 72 hours. ------------------------------------------------------------------------------------------------------------------ No results for input(s): CHOL, HDL, LDLCALC, TRIG, CHOLHDL, LDLDIRECT in the last 72 hours. ------------------------------------------------------------------------------------------------------------------ No results for input(s): TSH, T4TOTAL, T3FREE, THYROIDAB in the last 72 hours.  Invalid input(s): FREET3 ------------------------------------------------------------------------------------------------------------------ No results for input(s): VITAMINB12, FOLATE, FERRITIN, TIBC, IRON, RETICCTPCT in the last 72 hours.  Coagulation profile No results for input(s): INR, PROTIME in the last 168 hours.  No results for input(s): DDIMER in the last 72 hours.  Cardiac Enzymes No results for input(s): CKMB, TROPONINI, MYOGLOBIN in the last 168 hours.  Invalid input(s): CK ------------------------------------------------------------------------------------------------------------------ Invalid input(s): POCBNP   Time Spent in minutes   30   Lala Lund K M.D on 10/09/2014 at 10:46 AM  Between 7am to 7pm - Pager - [phone]  After 7pm go to [email or web address] - password [facility]  [facility] -  Office  [phone]

## 2014-10-10 ENCOUNTER — Encounter (HOSPITAL_COMMUNITY): Payer: Self-pay | Admitting: Physician Assistant

## 2014-10-10 DIAGNOSIS — K6389 Other specified diseases of intestine: Secondary | ICD-10-CM

## 2014-10-10 HISTORY — DX: Other specified diseases of intestine: K63.89

## 2014-10-10 LAB — BASIC METABOLIC PANEL
Anion gap: 5 (ref 5–15)
BUN: <5 mg/dL — ABNORMAL LOW (ref 6–20)
CO2: 22 mmol/L (ref 22–32)
Calcium: 8.2 mg/dL — ABNORMAL LOW (ref 8.9–10.3)
Chloride: 109 mmol/L (ref 101–111)
Creatinine, Ser: 0.64 mg/dL (ref 0.44–1.00)
GFR calc Af Amer: >60 mL/min (ref >60)
GFR calc non Af Amer: >60 mL/min (ref >60)
Glucose, Bld: 106 mg/dL — ABNORMAL HIGH (ref 65–99)
Potassium: 3.8 mmol/L (ref 3.5–5.1)
Sodium: 136 mmol/L (ref 135–145)

## 2014-10-10 LAB — CBC
HCT: 32.4 % — ABNORMAL LOW (ref 36.0–46.0)
Hemoglobin: 9.3 g/dL — ABNORMAL LOW (ref 12.0–15.0)
MCH: 18.7 pg — ABNORMAL LOW (ref 26.0–34.0)
MCHC: 28.7 g/dL — ABNORMAL LOW (ref 30.0–36.0)
MCV: 65.2 fL — ABNORMAL LOW (ref 78.0–100.0)
Platelets: 231 10*3/uL (ref 150–400)
RBC: 4.97 MIL/uL (ref 3.87–5.11)
RDW: 20.7 % — ABNORMAL HIGH (ref 11.5–15.5)
WBC: 9.5 10*3/uL (ref 4.0–10.5)

## 2014-10-10 LAB — MAGNESIUM: Magnesium: 1.9 mg/dL (ref 1.7–2.4)

## 2014-10-10 MED ORDER — APIXABAN 5 MG PO TABS
5.0000 mg | ORAL_TABLET | Freq: Two times a day (BID) | ORAL | Status: DC
  Administered 2014-10-10 – 2014-10-11 (×3): 5 mg via ORAL
  Filled 2014-10-10 (×4): qty 1

## 2014-10-10 MED ORDER — MAGNESIUM HYDROXIDE 400 MG/5ML PO SUSP
30.0000 mL | Freq: Two times a day (BID) | ORAL | Status: DC
  Administered 2014-10-10 (×2): 30 mL via ORAL
  Filled 2014-10-10 (×4): qty 30

## 2014-10-10 MED ORDER — LINACLOTIDE 145 MCG PO CAPS
145.0000 ug | ORAL_CAPSULE | Freq: Every day | ORAL | Status: DC
  Administered 2014-10-10 – 2014-10-16 (×7): 145 ug via ORAL
  Filled 2014-10-10 (×7): qty 1

## 2014-10-10 MED ORDER — MORPHINE SULFATE 2 MG/ML IJ SOLN
2.0000 mg | Freq: Once | INTRAMUSCULAR | Status: AC
  Administered 2014-10-10: 2 mg via INTRAVENOUS
  Filled 2014-10-10: qty 1

## 2014-10-10 MED ORDER — SORBITOL 70 % SOLN
960.0000 mL | TOPICAL_OIL | Freq: Once | ORAL | Status: AC
  Administered 2014-10-10: 960 mL via RECTAL
  Filled 2014-10-10: qty 240

## 2014-10-10 NOTE — Progress Notes (Signed)
[facility] paged that pt pain management is not effective Toradol 30 mg IV was given pt ambulated in halls no relief for pain. Ilean Skill LPN

## 2014-10-10 NOTE — Progress Notes (Signed)
Patient asked for a k-pad- NP Craige Cotta notified.

## 2014-10-10 NOTE — Progress Notes (Addendum)
Beth in Endo contacted to clarify it patient is on schedule for flex sig today. Beth stated she will contact RN when she finds out further information. Night RN gave in report that patient was going down today and patient has been NPO. RN to clarify with Dr. Thedore Mins as well.   0915: Dr. Christella Hartigan with GI contacted and to come assess patient.

## 2014-10-10 NOTE — Progress Notes (Signed)
[facility] Progress Note    Since last GI note: BMs overnight but not quite the volume we'd expect. She says MOM works best for her.    Objective: Vital signs in last 24 hours: Temp:  [98.2 F (36.8 C)-99 F (37.2 C)] 98.2 F (36.8 C) (08/01 0550) Pulse Rate:  [59-135] 110 (08/01 0714) Resp:  [16-20] 18 (08/01 0550) BP: (113-134)/(64-78) 134/78 mmHg (08/01 0550) SpO2:  [96 %-100 %] 100 % (08/01 0550) Last BM Date: 10/10/14 General: alert and oriented times 3 Heart: regular rate and rythm Abdomen: soft, non-tender, non-distended, normal bowel sounds   Lab Results:  Recent Labs  10/07/14 2125 October 16, 2014 0633 10-16-14 1805 10/10/14 0600  WBC 7.3 10.9*  --  9.5  HGB 10.2* 9.0* 9.0* 9.3*  PLT 215 201  --  231  MCV 64.6* 64.8*  --  65.2*    Recent Labs  10/07/14 2125 10/16/14 0633 10/10/14 0600  NA 139 137 136  K 3.6 3.3* 3.8  CL 106 106 109  CO2 22 24 22   GLUCOSE 109* 113* 106*  BUN 10 9 <5*  CREATININE 0.80 0.87 0.64  CALCIUM 9.5 7.9* 8.2*    Recent Labs  10/07/14 2125  PROT 6.3*  ALBUMIN 3.8  AST 38  ALT 47  ALKPHOS 51  BILITOT 0.5   Studies/Results: Dg Abd 2 Views  10-16-2014   CLINICAL DATA:  Generalized abdominal pain. Nausea vomiting diarrhea for 2 days  EXAM: ABDOMEN - 2 VIEW  COMPARISON:  CT 10/06/2014, radiograph 10/06/2014  FINDINGS: The oral contrast from CT of 10/06/2014 has progressed into the rectum. There is mild decrease in the volume of stool seen on comparison exam. No dilated loops of large or small bowel. NO intraoperative free air.  IMPRESSION: No evidence of bowel obstruction. Progression of oral contrast the rectum from CT of 10/06/2014.   Electronically Signed   By: Suzy Bouchard M.D.   On: 10/16/2014 09:33     Medications: Scheduled Meds: . [START ON 10/11/2014] apixaban  5 mg Oral BID  . bisacodyl  10 mg Rectal Daily  . FLUoxetine  20 mg Oral Daily  . levETIRAcetam  500 mg Intravenous Q12H  . methadone  85 mg  Oral Daily  . methylnaltrexone  12 mg Subcutaneous QODAY  . nicotine  21 mg Transdermal Daily  . polyethylene glycol powder  1 Container Oral Once  . sorbitol, milk of mag, mineral oil, glycerin (SMOG) enema  960 mL Rectal Once   Continuous Infusions: . 0.9 % NaCl with KCl 20 mEq / L 100 mL/hr at 10/10/14 0313   PRN Meds:.acetaminophen **OR** acetaminophen, alum & mag hydroxide-simeth, ketorolac, metoCLOPramide (REGLAN) injection, morphine injection, ondansetron **OR** ondansetron (ZOFRAN) IV    Assessment/Plan: 24 y.o. female with acute on chronic constipation, rectal bleeding.  She understands that methadone likely plays a role in her constipation (normally one BM a week, has had to manually disimpact in the past).  Most likely her acute situation is from constipation and she has even suffered some radiographic damage to her right colon (pneumatosis).  Bleeding is also likely from the constipation (damage to right colon or perhaps stercol ulcer).  Blood thinner contribute to her blood loss.  I agree she needs lower endoscopy evaluation at some point but am concerned that with doing that in the acute setting with the pneumatosis we already see.  Prefer to purge her bowels while in-patient and get her on a good daily regimen more chronically. She seems committed  to coming completely off methadone (through pain clinic tapering).  For now, will order MOM BID (this has helped her in past).  Will also start linzess once daily.  She can restart her apixiban per primary service.    Milus Banister, MD  10/10/2014, 10:27 AM [facility] Pager [phone]

## 2014-10-10 NOTE — Progress Notes (Signed)
ANTICOAGULATION CONSULT NOTE  Pharmacy Consult for Eliquis Indication: stroke  Allergies  Allergen Reactions  . Ativan [Lorazepam] Other (See Comments)    " I don't remember anything."  . Bee Venom Swelling  . Ciprofloxacin Diarrhea and Nausea And Vomiting    Patient Measurements: Height:  (157.5 cm) Weight: 170 lb (77.111 kg) IBW/kg (Calculated) : 50.1  Vital Signs: Temp: 98.2 F (36.8 C) (08/01 0550) Temp Source: Oral (08/01 0550) BP: 134/78 mmHg (08/01 0550) Pulse Rate: 110 (08/01 0714)  Labs:  Recent Labs  10/07/14 2125 10/09/14 0633 10/09/14 1805 10/10/14 0600  HGB 10.2* 9.0* 9.0* 9.3*  HCT 33.7* 30.5* 31.2* 32.4*  PLT 215 201  --  231  CREATININE 0.80 0.87  --  0.64    Estimated Creatinine Clearance: 103.4 mL/min (by C-G formula based on Cr of 0.64).   Assessment: 24 YOF with compliants of GI pain and multiple ED visit. Noted to have had a stroke 4 months ago. On eliquis  bid PTA, but reported not taking Eliquis for past week PTA.  GI following for constipation issues which were likely causing bleeding. Eliquis was held in anticipation of lower endoscopy evaluation today, but now to be deferred to outpatient setting.  Hgb low but stable, plts nml. No overt bleeding noted. No changes to renal function or weight that would affect Eliquis dosing.  Goal of Therapy:  Monitor platelets by anticoagulation protocol: Yes   Plan:  -restart Eliquis  BID today -Pharmacy will sign off and follow peripherally  Juston Goheen D. Taedyn Glasscock, PharmD, BCPS Clinical Pharmacist Pager: [phone] 10/10/2014 12:27 PM

## 2014-10-10 NOTE — Progress Notes (Addendum)
Patient's pain not under control, requesting for pain medication right at the moment. Next pain medication due @ 0106. Floor coverage MD, Burnadette Peter, paged and notified.  Kendall Flack 10/10/2014 12:32 AM  Lynch MD placed order for IV Morphine 2 mg once. Will carry out order.   Kendall Flack 10/10/2014 12:56 AM

## 2014-10-10 NOTE — Progress Notes (Signed)
Patient Demographics:    Victoria Alvarez, is a 24 y.o. female, DOB - 11-29-1990, RFF:[id number]  Admit date - 10/07/2014   Admitting Physician Theodis Blaze, MD  Outpatient Primary MD for the patient is Minerva Ends, MD  LOS - 2   Chief Complaint  Patient presents with  . Abdominal Pain        Subjective:    Victoria Alvarez today has, No headache, No chest pain, mild abdominal pain - No Nausea, No new weakness tingling or numbness, No Cough - SOB.     Assessment  & Plan :     1. Abdominal pain due to massive stool burden from narcotic bowel. Question raised for Pneumatosis of the cecum and ascending colon on CT scan, seen by general surgery who clinically ruled it out. Placed on bowel regimen including Relistor. Multiple bowel movements. Clinically improved with a benign abdominal exam. Per GI resume diet and Eliquis. Monitor H&H today stable discharge in the morning.   2. History of seizure. Continue home regimen.   3. Smoker. Counseled to quit   4. Chronic pain. On methadone. Requested to taper off gradually.   5. Hypokalemia. Replaced.   6.H/O CVA - on Eliquis, resumed per GI.     Code Status : Full  Family Communication  : Stable  Disposition Plan  : Home 1-2 days  Consults  :  Surgery, GI  Procedures  : CT Abd Pelvis - stool, ? Pneumatosis of the cecum and ascending colon which was ruled out clinically by surgery  DVT Prophylaxis  :  Eliquis  Lab Results  Component Value Date   PLT 231 10/10/2014    Inpatient Medications  Scheduled Meds: . [START ON 10/11/2014] apixaban  5 mg Oral BID  . FLUoxetine  20 mg Oral Daily  . levETIRAcetam  500 mg Intravenous Q12H  . Linaclotide  145 mcg Oral Daily  . magnesium hydroxide  30 mL Oral BID  . methadone  85 mg Oral  Daily  . methylnaltrexone  12 mg Subcutaneous QODAY  . nicotine  21 mg Transdermal Daily  . polyethylene glycol powder  1 Container Oral Once  . sorbitol, milk of mag, mineral oil, glycerin (SMOG) enema  960 mL Rectal Once   Continuous Infusions: . 0.9 % NaCl with KCl 20 mEq / L 100 mL/hr at 10/10/14 0313   PRN Meds:.acetaminophen **OR** acetaminophen, alum & mag hydroxide-simeth, ketorolac, metoCLOPramide (REGLAN) injection, ondansetron **OR** ondansetron (ZOFRAN) IV  Antibiotics  :     Anti-infectives    Start     Dose/Rate Route Frequency Ordered Stop   10/08/14 0645  piperacillin-tazobactam (ZOSYN) IVPB 3.375 g     3.375 g 100 mL/hr over 30 Minutes Intravenous  Once 10/08/14 0630 10/08/14 0737        Objective:   Filed Vitals:   10/09/14 1450 10/09/14 2128 10/10/14 0550 10/10/14 0714  BP: 113/64 129/65 134/78   Pulse: 72 59 135 110  Temp: 99 F (37.2 C) 98.8 F (37.1 C) 98.2 F (36.8 C)   TempSrc: Oral Oral Oral   Resp: 20 16 18    Height:      Weight:      SpO2: 96% 99% 100%     Wt  Readings from Last 3 Encounters:  10/07/14 77.111 kg (170 lb)  08/05/14 77.111 kg (170 lb)  07/18/14 84.369 kg (186 lb)     Intake/Output Summary (Last 24 hours) at 10/10/14 1136 Last data filed at 10/10/14 1007  Gross per 24 hour  Intake 4239.99 ml  Output   1700 ml  Net 2539.99 ml     Physical Exam  Awake Alert, Oriented X 3, No new F.N deficits, Normal affect [place].AT,PERRAL Supple Neck,No JVD, No cervical lymphadenopathy appriciated.  Symmetrical Chest wall movement, Good air movement bilaterally, CTAB RRR,No Gallops,Rubs or new Murmurs, No Parasternal Heave +ve B.Sounds, Abd Soft, No tenderness, No organomegaly appriciated, No rebound - guarding or rigidity. No Cyanosis, Clubbing or edema, No new Rash or bruise     Data Review:   Micro Results No results found for this or any previous visit (from the past 240 hour(s)).  Radiology Reports Ct Abdomen Pelvis Wo  Contrast  10/08/2014   CLINICAL DATA:  Generalized abdominal pain, greatest in the left lower quadrant.  EXAM: CT ABDOMEN AND PELVIS WITHOUT CONTRAST  TECHNIQUE: Multidetector CT imaging of the abdomen and pelvis was performed following the standard protocol without IV contrast.  COMPARISON:  07/17/2013  FINDINGS: There is pneumatosis of the cecum and ascending colon with a generous volume of air in the wall. There also is a little air which appears to extend toward the undersurface of the gallbladder and liver, perhaps in the hepatoduodenal ligament. There is a small bubble of air in the porta hepatis which may reside within the portal vein. No frank free intraperitoneal air is evident. Despite the rather generous volume of pneumatosis, there is no significant inflammatory change about the cecum or ascending colon. There is no bowel obstruction. Appendix is normal.  There are unremarkable unenhanced appearances of the liver, spleen, pancreas, adrenals and kidneys. Uterus and ovaries appear normal. There is no significant abnormality in the lower chest. There is no significant musculoskeletal abnormality.  IMPRESSION: Pneumatosis of the cecum and ascending colon. There are a few bubbles of air extending up toward the under surface of the gallbladder but the air does not appear to be free in the peritoneum. There also may be a bubble of air in the portal vein. Despite the generous volume of extraluminal air, there is little or no inflammatory change. There is no obstruction. There is no ascites. These results were called by telephone at the time of interpretation on 10/08/2014 at 2:51 am to Dr. Dina Rich, who verbally acknowledged these results.   Electronically Signed   By: Andreas Newport M.D.   On: 10/08/2014 02:57   Dg Chest 2 View  10/06/2014   CLINICAL DATA:  Abdominal pain and shortness of breath for 5 months.  EXAM: CHEST  2 VIEW  COMPARISON:  Chest radiograph 06/12/2014  FINDINGS: The heart is normal in  size. Mild diffuse bronchial thickening. Pulmonary vasculature is normal. No consolidation, pleural effusion, or pneumothorax. No acute osseous abnormalities are seen.  IMPRESSION: Mild diffuse bronchial thickening, can be seen with asthma or bronchitis.   Electronically Signed   By: Jeb Levering M.D.   On: 10/06/2014 21:29   Dg Abd 2 Views  10/09/2014   CLINICAL DATA:  Generalized abdominal pain. Nausea vomiting diarrhea for 2 days  EXAM: ABDOMEN - 2 VIEW  COMPARISON:  CT 10/06/2014, radiograph 10/06/2014  FINDINGS: The oral contrast from CT of 10/06/2014 has progressed into the rectum. There is mild decrease in the volume of stool seen  on comparison exam. No dilated loops of large or small bowel. NO intraoperative free air.  IMPRESSION: No evidence of bowel obstruction. Progression of oral contrast the rectum from CT of 10/06/2014.   Electronically Signed   By: Suzy Bouchard M.D.   On: 10/09/2014 09:33   Dg Abd 2 Views  10/06/2014   CLINICAL DATA:  Abdominal pain and diarrhea for 5 months.  EXAM: ABDOMEN - 2 VIEW  COMPARISON:  07/13/2014  FINDINGS: There is no free intra-abdominal air. No dilated bowel loops to suggest obstruction. Moderate volume of stool throughout the colon. No radiopaque calculi. Pelvic phleboliths and surgical clips projecting over the left inguinal region, unchanged. There is chronic widening of the pubic symphysis. No acute osseous abnormalities are seen.  IMPRESSION: Normal abdominal radiographs with moderate volume of colonic stool.   Electronically Signed   By: Jeb Levering M.D.   On: 10/06/2014 21:32     CBC  Recent Labs Lab 10/06/14 2136 10/07/14 2125 10/09/14 0633 10/09/14 1805 10/10/14 0600  WBC 5.7 7.3 10.9*  --  9.5  HGB 9.2* 10.2* 9.0* 9.0* 9.3*  HCT 31.0* 33.7* 30.5* 31.2* 32.4*  PLT 226 215 201  --  231  MCV 64.6* 64.6* 64.8*  --  65.2*  MCH 19.2* 19.5* 19.1*  --  18.7*  MCHC 29.7* 30.3 29.5*  --  28.7*  RDW 20.4* 20.6* 20.6*  --  20.7*   LYMPHSABS 2.6 1.0  --   --   --   MONOABS 0.4 0.3  --   --   --   EOSABS 0.1 0.0  --   --   --   BASOSABS 0.1 0.0  --   --   --     Chemistries   Recent Labs Lab 10/06/14 1751 10/07/14 2125 10/09/14 0633 10/10/14 0600  NA 138 139 137 136  K 4.2 3.6 3.3* 3.8  CL 105 106 106 109  CO2 22 22 24 22   GLUCOSE 88 109* 113* 106*  BUN 14 10 9  <5*  CREATININE 0.69 0.80 0.87 0.64  CALCIUM 9.6 9.5 7.9* 8.2*  MG  --   --   --  1.9  AST 37 38  --   --   ALT 45 47  --   --   ALKPHOS 62 51  --   --   BILITOT 0.4 0.5  --   --    ------------------------------------------------------------------------------------------------------------------ estimated creatinine clearance is 103.4 mL/min (by C-G formula based on Cr of 0.64). ------------------------------------------------------------------------------------------------------------------ No results for input(s): HGBA1C in the last 72 hours. ------------------------------------------------------------------------------------------------------------------ No results for input(s): CHOL, HDL, LDLCALC, TRIG, CHOLHDL, LDLDIRECT in the last 72 hours. ------------------------------------------------------------------------------------------------------------------ No results for input(s): TSH, T4TOTAL, T3FREE, THYROIDAB in the last 72 hours.  Invalid input(s): FREET3 ------------------------------------------------------------------------------------------------------------------ No results for input(s): VITAMINB12, FOLATE, FERRITIN, TIBC, IRON, RETICCTPCT in the last 72 hours.  Coagulation profile No results for input(s): INR, PROTIME in the last 168 hours.  No results for input(s): DDIMER in the last 72 hours.  Cardiac Enzymes No results for input(s): CKMB, TROPONINI, MYOGLOBIN in the last 168 hours.  Invalid input(s):  CK ------------------------------------------------------------------------------------------------------------------ Invalid input(s): POCBNP   Time Spent in minutes   30   Kiwana Deblasi K M.D on 10/10/2014 at 11:36 AM  Between 7am to 7pm - Pager - [phone]  After 7pm go to [email or web address] - password [facility]  [facility] -  Office  [phone]

## 2014-10-11 ENCOUNTER — Inpatient Hospital Stay (HOSPITAL_COMMUNITY): Payer: Medicaid Other

## 2014-10-11 ENCOUNTER — Encounter (HOSPITAL_COMMUNITY): Payer: Self-pay | Admitting: Physician Assistant

## 2014-10-11 DIAGNOSIS — I81 Portal vein thrombosis: Secondary | ICD-10-CM

## 2014-10-11 DIAGNOSIS — R1084 Generalized abdominal pain: Secondary | ICD-10-CM

## 2014-10-11 HISTORY — DX: Portal vein thrombosis: I81

## 2014-10-11 LAB — PROTIME-INR
INR: 1.54 — ABNORMAL HIGH (ref 0.00–1.49)
Prothrombin Time: 18.5 seconds — ABNORMAL HIGH (ref 11.6–15.2)

## 2014-10-11 LAB — CBC
HCT: 34.4 % — ABNORMAL LOW (ref 36.0–46.0)
Hemoglobin: 10 g/dL — ABNORMAL LOW (ref 12.0–15.0)
MCH: 19.2 pg — ABNORMAL LOW (ref 26.0–34.0)
MCHC: 29.1 g/dL — ABNORMAL LOW (ref 30.0–36.0)
MCV: 66.2 fL — ABNORMAL LOW (ref 78.0–100.0)
Platelets: 212 10*3/uL (ref 150–400)
RBC: 5.2 MIL/uL — ABNORMAL HIGH (ref 3.87–5.11)
RDW: 20.5 % — ABNORMAL HIGH (ref 11.5–15.5)
WBC: 8 10*3/uL (ref 4.0–10.5)

## 2014-10-11 MED ORDER — POTASSIUM CHLORIDE IN NACL 20-0.9 MEQ/L-% IV SOLN
INTRAVENOUS | Status: AC
  Administered 2014-10-11 – 2014-10-12 (×2): via INTRAVENOUS
  Filled 2014-10-11 (×3): qty 1000

## 2014-10-11 MED ORDER — IOHEXOL 300 MG/ML  SOLN
25.0000 mL | INTRAMUSCULAR | Status: AC
Start: 2014-10-11 — End: 2014-10-11
  Administered 2014-10-11 (×2): 25 mL via ORAL

## 2014-10-11 MED ORDER — IOHEXOL 300 MG/ML  SOLN: 100.0000 mL | Freq: Once | INTRAMUSCULAR | Status: AC | PRN

## 2014-10-11 MED ORDER — MORPHINE SULFATE 2 MG/ML IJ SOLN
2.0000 mg | Freq: Four times a day (QID) | INTRAMUSCULAR | Status: DC | PRN
  Administered 2014-10-11 – 2014-10-16 (×19): 2 mg via INTRAVENOUS
  Filled 2014-10-11 (×20): qty 1

## 2014-10-11 MED ORDER — HEPARIN (PORCINE) IN NACL 100-0.45 UNIT/ML-% IJ SOLN
1250.0000 [IU]/h | INTRAMUSCULAR | Status: DC
  Administered 2014-10-11: 1100 [IU]/h via INTRAVENOUS
  Administered 2014-10-12 – 2014-10-16 (×5): 1250 [IU]/h via INTRAVENOUS
  Filled 2014-10-11 (×6): qty 250

## 2014-10-11 NOTE — Progress Notes (Addendum)
ANTICOAGULATION CONSULT NOTE - Initial Consult  Pharmacy Consult for heparin  Indication: portal vein thrombosis  Allergies  Allergen Reactions  . Ativan [Lorazepam] Other (See Comments)    " I don't remember anything."  . Bee Venom Swelling  . Ciprofloxacin Diarrhea and Nausea And Vomiting    Patient Measurements: Height: 5\' 2"  (157.5 cm) Weight: 170 lb (77.111 kg) IBW/kg (Calculated) : 50.1 Heparin Dosing Weight: 67 kg  Vital Signs: Temp: 98.4 F (36.9 C) (08/02 1334) Temp Source: Oral (08/02 1334) BP: 132/66 mmHg (08/02 1334) Pulse Rate: 76 (08/02 1334)  Labs:  Recent Labs  10/09/14 0633 10/09/14 1805 10/10/14 0600 10/11/14 0555  HGB 9.0* 9.0* 9.3* 10.0*  HCT 30.5* 31.2* 32.4* 34.4*  PLT 201  --  231 212  CREATININE 0.87  --  0.64  --     Estimated Creatinine Clearance: 103.4 mL/min (by C-G formula based on Cr of 0.64).   Medical History: Past Medical History  Diagnosis Date  . Interstitial cystitis     pain from this was reason for starting opioids age 21.   . Chronic pelvic pain in female   . Headache(784.0)   . Pyelonephritis affecting pregnancy in first trimester July 2015  . Depression 2007  . Opioid dependence     to Rx opioids.  switched to Methadone after second child born in 04/2014  . Pneumonia 03/2014  . Seizures   . Stroke 05/2014    presented with right sided weakness.   . Numbness     right side  . Dizziness 07/18/14  . Confusion   . 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 Preeclampsia 2012  . Miscarriage 2005    Medications:  See EMR  Assessment: 24 yo female with hx of stroke, sz, on Eliquis PTA. She stopped taking Eliquis a few days prior to admission. She is admitted now with abdominal pain, CT abdomen showed a thrombosis of L portal vein.  Pharmacy consulted to start heparin.  It is unclear if this is a Eliquis failure since she had stopped taking the medication.  Her last dose of Eliquis was this morning at 1000. hgb 10, about at  baseline, plts wnl.   Goal of Therapy:  Heparin level 0.3-0.7 units/ml Monitor platelets by anticoagulation protocol: Yes   Plan:  -heparin 1100 units/hr -daily HL, CBC -monitor s/sx bleeding -f/u plan for PO anticoag  Agapito Games, PharmD, BCPS Clinical Pharmacist Pager: [phone] 10/11/2014 4:13 PM

## 2014-10-11 NOTE — Progress Notes (Signed)
[facility] Progress Note    Since last GI note: Seems to have worse abd pains this morning. Was up all night with pain. Only minimal BMs but KUB suggests colon is clear.  Objective: Vital signs in last 24 hours: Temp:  [98.1 F (36.7 C)-99.8 F (37.7 C)] 99.8 F (37.7 C) (08/02 0541) Pulse Rate:  [62-65] 64 (08/02 0541) Resp:  [16-20] 18 (08/02 0541) BP: (111-154)/(62-92) 154/92 mmHg (08/02 0541) SpO2:  [99 %-100 %] 100 % (08/02 0541) Last BM Date: 10/10/14 General: alert and oriented times 3 Heart: regular rate and rythm Abdomen: soft, mild to moderately tender throughout, non-distended, normal bowel sounds   Lab Results:  Recent Labs  10/09/14 0633 10/09/14 1805 10/10/14 0600 10/11/14 0555  WBC 10.9*  --  9.5 8.0  HGB 9.0* 9.0* 9.3* 10.0*  PLT 201  --  231 212  MCV 64.8*  --  65.2* 66.2*    Recent Labs  10/09/14 0633 10/10/14 0600  NA 137 136  K 3.3* 3.8  CL 106 109  CO2 24 22  GLUCOSE 113* 106*  BUN 9 <5*  CREATININE 0.87 0.64  CALCIUM 7.9* 8.2*    Studies/Results: Dg Abd Portable 1v  10/11/2014   CLINICAL DATA:  Generalized abdominal pain, nausea, vomiting and constipation, over the past 4 days. Initial encounter.  EXAM: PORTABLE ABDOMEN - 1 VIEW  COMPARISON:  Abdominal radiograph performed 10/09/2014  FINDINGS: The colon contains a small amount of air but is otherwise empty. No bowel dilatation is seen to suggest obstruction. No free intra-abdominal air is seen, though evaluation for free air is limited on a single supine view.  No acute osseous abnormalities are identified.  IMPRESSION: Colon contains a small amount of air but is otherwise empty. No evidence for bowel obstruction. No radiographic evidence to suggest constipation.   Electronically Signed   By: Garald Balding M.D.   On: 10/11/2014 00:36    Medications: Scheduled Meds: . apixaban  5 mg Oral BID  . FLUoxetine  20 mg Oral Daily  . levETIRAcetam  500 mg Intravenous Q12H  .  Linaclotide  145 mcg Oral Daily  . magnesium hydroxide  30 mL Oral BID  . methadone  85 mg Oral Daily  . methylnaltrexone  12 mg Subcutaneous QODAY  . nicotine  21 mg Transdermal Daily  . polyethylene glycol powder  1 Container Oral Once   Continuous Infusions: . 0.9 % NaCl with KCl 20 mEq / L 100 mL/hr (10/11/14 0236)   PRN Meds:.acetaminophen **OR** acetaminophen, alum & mag hydroxide-simeth, ketorolac, metoCLOPramide (REGLAN) injection, ondansetron **OR** ondansetron (ZOFRAN) IV    Assessment/Plan: 24 y.o. female with abd pain that seems worse, in setting of recent severe constipation and pneumatosis right colon at admit  Would expect her pains to be improved if constipation were the primary issue here since now her colon appears cleared out by KUB.  Planning for CT scan abd/pelvis this AM to recheck status of right colon injury.    Milus Banister, MD  10/11/2014, 8:03 AM [facility] Pager [phone]

## 2014-10-11 NOTE — Progress Notes (Addendum)
Patient Demographics:    Victoria Alvarez, is a 24 y.o. female, DOB - Dec 18, 1990, FFM:[id number]  Admit date - 10/07/2014   Admitting Physician Theodis Blaze, MD  Outpatient Primary MD for the patient is Minerva Ends, MD  LOS - 3   Chief Complaint  Patient presents with  . Abdominal Pain        Subjective:    Victoria Alvarez today has, No headache, No chest pain, mild abdominal pain - No Nausea, No new weakness tingling or numbness, No Cough - SOB.     Assessment  & Plan :     1. Abdominal pain due to massive stool burden from narcotic bowel. Question raised for Pneumatosis of the cecum and ascending colon on CT scan, seen by general surgery who clinically ruled it out.   She was seen by general surgery in the ER and they signed off, seen by GI here due to continued abdominal pain despite adequate stool output and bowel cleansing. Repeat CT scan was done on 10/11/2014 which showed evidence of portal vein thrombosis.   D/W vascular surgeon Dr. Scot Dock who advises to continue anticoagulation and he will see the patient soon.  Patient likely developed portal vein thrombosis due to noncompliance with Eliquis apparently she had stopped taking Eliquis 4-7 days prior to hospital admission due to her periods. 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  I D/W  hematologist Dr. Alvy Bimler as well. She will see her in the am, likely some atypical hypercoagulable state. For now continue Eliquis.    2. History of seizure. Continue home regimen.   3. Smoker. Counseled to quit   4. Chronic pain. On methadone. Requested to taper off gradually.   5. Hypokalemia. Replaced.   6.H/O Sup Sagittal Sinus Thrombosis - was on Eliquis, she had not been  taking it a week prior to coming to the hospital and developed abdominal pain, Eliquis was resumed by GI. Hematologist to see the patient in the morning.     Code Status : Full  Family Communication  : Stable  Disposition Plan  : Home 1-2 days  Consults  :  Surgery, GI, IR, Vascular Surg, Haem  Procedures  :   CT Abd Pelvis - stool, ? Pneumatosis of the cecum and ascending colon which was ruled out clinically by surgery  Repeat CT scan abdomen and pelvis ordered on 10/11/2014 - possible portal vein thrombosis   DVT Prophylaxis  :  Eliquis  Lab Results  Component Value Date   PLT 212 10/11/2014    Inpatient Medications  Scheduled Meds: . apixaban  5 mg Oral BID  . FLUoxetine  20 mg Oral Daily  . iohexol  25 mL Oral Q1 Hr x 2  . levETIRAcetam  500 mg Intravenous Q12H  . Linaclotide  145 mcg Oral Daily  . magnesium hydroxide  30 mL Oral BID  . methadone  85 mg Oral Daily  . methylnaltrexone  12 mg Subcutaneous QODAY  . nicotine  21 mg Transdermal Daily  . polyethylene glycol powder  1 Container Oral Once   Continuous Infusions: . 0.9 % NaCl with KCl 20 mEq / L 100 mL/hr (10/11/14 0236)   PRN Meds:.acetaminophen **OR**  acetaminophen, alum & mag hydroxide-simeth, ketorolac, metoCLOPramide (REGLAN) injection, ondansetron **OR** ondansetron (ZOFRAN) IV  Antibiotics  :     Anti-infectives    Start     Dose/Rate Route Frequency Ordered Stop   10/08/14 0645  piperacillin-tazobactam (ZOSYN) IVPB 3.375 g     3.375 g 100 mL/hr over 30 Minutes Intravenous  Once 10/08/14 0630 10/08/14 0737        Objective:   Filed Vitals:   10/10/14 0714 10/10/14 1659 10/10/14 2101 10/11/14 0541  BP:  113/66 111/62 154/92  Pulse: 110 62 65 64  Temp:  98.4 F (36.9 C) 98.1 F (36.7 C) 99.8 F (37.7 C)  TempSrc:  Oral Oral Oral  Resp:  16 20 18   Height:      Weight:      SpO2:  99% 99% 100%    Wt Readings from Last 3 Encounters:  10/07/14 77.111 kg (170 lb)  08/05/14 77.111  kg (170 lb)  07/18/14 84.369 kg (186 lb)     Intake/Output Summary (Last 24 hours) at 10/11/14 1147 Last data filed at 10/11/14 0956  Gross per 24 hour  Intake 3218.34 ml  Output      1 ml  Net 3217.34 ml     Physical Exam  Awake Alert, Oriented X 3, No new F.N deficits, Normal affect [place].AT,PERRAL Supple Neck,No JVD, No cervical lymphadenopathy appriciated.  Symmetrical Chest wall movement, Good air movement bilaterally, CTAB RRR,No Gallops,Rubs or new Murmurs, No Parasternal Heave +ve B.Sounds, Abd Soft, No tenderness, No organomegaly appriciated, No rebound - guarding or rigidity. No Cyanosis, Clubbing or edema, No new Rash or bruise     Data Review:   Micro Results No results found for this or any previous visit (from the past 240 hour(s)).  Radiology Reports Ct Abdomen Pelvis Wo Contrast  10/08/2014   CLINICAL DATA:  Generalized abdominal pain, greatest in the left lower quadrant.  EXAM: CT ABDOMEN AND PELVIS WITHOUT CONTRAST  TECHNIQUE: Multidetector CT imaging of the abdomen and pelvis was performed following the standard protocol without IV contrast.  COMPARISON:  07/17/2013  FINDINGS: There is pneumatosis of the cecum and ascending colon with a generous volume of air in the wall. There also is a little air which appears to extend toward the undersurface of the gallbladder and liver, perhaps in the hepatoduodenal ligament. There is a small bubble of air in the porta hepatis which may reside within the portal vein. No frank free intraperitoneal air is evident. Despite the rather generous volume of pneumatosis, there is no significant inflammatory change about the cecum or ascending colon. There is no bowel obstruction. Appendix is normal.  There are unremarkable unenhanced appearances of the liver, spleen, pancreas, adrenals and kidneys. Uterus and ovaries appear normal. There is no significant abnormality in the lower chest. There is no significant musculoskeletal abnormality.   IMPRESSION: Pneumatosis of the cecum and ascending colon. There are a few bubbles of air extending up toward the under surface of the gallbladder but the air does not appear to be free in the peritoneum. There also may be a bubble of air in the portal vein. Despite the generous volume of extraluminal air, there is little or no inflammatory change. There is no obstruction. There is no ascites. These results were called by telephone at the time of interpretation on 10/08/2014 at 2:51 am to Dr. Dina Rich, who verbally acknowledged these results.   Electronically Signed   By: Andreas Newport M.D.   On: 10/08/2014 02:57  Dg Chest 2 View  10/06/2014   CLINICAL DATA:  Abdominal pain and shortness of breath for 5 months.  EXAM: CHEST  2 VIEW  COMPARISON:  Chest radiograph 06/12/2014  FINDINGS: The heart is normal in size. Mild diffuse bronchial thickening. Pulmonary vasculature is normal. No consolidation, pleural effusion, or pneumothorax. No acute osseous abnormalities are seen.  IMPRESSION: Mild diffuse bronchial thickening, can be seen with asthma or bronchitis.   Electronically Signed   By: Jeb Levering M.D.   On: 10/06/2014 21:29   Dg Abd 2 Views  10/09/2014   CLINICAL DATA:  Generalized abdominal pain. Nausea vomiting diarrhea for 2 days  EXAM: ABDOMEN - 2 VIEW  COMPARISON:  CT 10/06/2014, radiograph 10/06/2014  FINDINGS: The oral contrast from CT of 10/06/2014 has progressed into the rectum. There is mild decrease in the volume of stool seen on comparison exam. No dilated loops of large or small bowel. NO intraoperative free air.  IMPRESSION: No evidence of bowel obstruction. Progression of oral contrast the rectum from CT of 10/06/2014.   Electronically Signed   By: Suzy Bouchard M.D.   On: 10/09/2014 09:33   Dg Abd 2 Views  10/06/2014   CLINICAL DATA:  Abdominal pain and diarrhea for 5 months.  EXAM: ABDOMEN - 2 VIEW  COMPARISON:  07/13/2014  FINDINGS: There is no free intra-abdominal air. No  dilated bowel loops to suggest obstruction. Moderate volume of stool throughout the colon. No radiopaque calculi. Pelvic phleboliths and surgical clips projecting over the left inguinal region, unchanged. There is chronic widening of the pubic symphysis. No acute osseous abnormalities are seen.  IMPRESSION: Normal abdominal radiographs with moderate volume of colonic stool.   Electronically Signed   By: Jeb Levering M.D.   On: 10/06/2014 21:32     CBC  Recent Labs Lab 10/06/14 2136 10/07/14 2125 10/09/14 0633 10/09/14 1805 10/10/14 0600 10/11/14 0555  WBC 5.7 7.3 10.9*  --  9.5 8.0  HGB 9.2* 10.2* 9.0* 9.0* 9.3* 10.0*  HCT 31.0* 33.7* 30.5* 31.2* 32.4* 34.4*  PLT 226 215 201  --  231 212  MCV 64.6* 64.6* 64.8*  --  65.2* 66.2*  MCH 19.2* 19.5* 19.1*  --  18.7* 19.2*  MCHC 29.7* 30.3 29.5*  --  28.7* 29.1*  RDW 20.4* 20.6* 20.6*  --  20.7* 20.5*  LYMPHSABS 2.6 1.0  --   --   --   --   MONOABS 0.4 0.3  --   --   --   --   EOSABS 0.1 0.0  --   --   --   --   BASOSABS 0.1 0.0  --   --   --   --     Chemistries   Recent Labs Lab 10/06/14 1751 10/07/14 2125 10/09/14 0633 10/10/14 0600  NA 138 139 137 136  K 4.2 3.6 3.3* 3.8  CL 105 106 106 109  CO2 22 22 24 22   GLUCOSE 88 109* 113* 106*  BUN 14 10 9  <5*  CREATININE 0.69 0.80 0.87 0.64  CALCIUM 9.6 9.5 7.9* 8.2*  MG  --   --   --  1.9  AST 37 38  --   --   ALT 45 47  --   --   ALKPHOS 62 51  --   --   BILITOT 0.4 0.5  --   --    ------------------------------------------------------------------------------------------------------------------ estimated creatinine clearance is 103.4 mL/min (by C-G formula based on Cr of 0.64). ------------------------------------------------------------------------------------------------------------------  No results for input(s): HGBA1C in the last 72 hours. ------------------------------------------------------------------------------------------------------------------ No results  for input(s): CHOL, HDL, LDLCALC, TRIG, CHOLHDL, LDLDIRECT in the last 72 hours. ------------------------------------------------------------------------------------------------------------------ No results for input(s): TSH, T4TOTAL, T3FREE, THYROIDAB in the last 72 hours.  Invalid input(s): FREET3 ------------------------------------------------------------------------------------------------------------------ No results for input(s): VITAMINB12, FOLATE, FERRITIN, TIBC, IRON, RETICCTPCT in the last 72 hours.  Coagulation profile No results for input(s): INR, PROTIME in the last 168 hours.  No results for input(s): DDIMER in the last 72 hours.  Cardiac Enzymes No results for input(s): CKMB, TROPONINI, MYOGLOBIN in the last 168 hours.  Invalid input(s): CK ------------------------------------------------------------------------------------------------------------------ Invalid input(s): POCBNP   Time Spent in minutes   30   Jatziri Goffredo K M.D on 10/11/2014 at 11:47 AM  Between 7am to 7pm - Pager - [phone]  After 7pm go to [email or web address] - password [facility]  [facility] -  Office  [phone]

## 2014-10-11 NOTE — Progress Notes (Signed)
Pt requested that she see the MD. [facility] paged of the pt request. Pt was informed that the NP on call has to take care of needs of whole hospital and depending on the work load tonight she may or may not be able to see pt tonight. Pt was educated that narcotic can make her constipation worse and that K-pad has been ordered. To help with pain management. Will continue to monitor. Ilean Skill LPN

## 2014-10-11 NOTE — Consult Note (Addendum)
[facility]  Patient name: Victoria Alvarez MRN: [id number] DOB: 05-19-1990 Sex: female  REASON FOR CONSULT: Partial portal vein thrombosis  HPI: Victoria Alvarez is a 24 y.o. female who was admitted on 10/08/2014 by medical service with abdominal pain. She is a poor historian. Reportedly however she has a history of strokes in the past and is on Elliquis. She presented to the emergency department with worsening abdominal pain. She underwent a CT of the abdomen which showed pneumatosis of the cecum and ascending colon. Gen. Surgery was consulted.  They felt that the patient's physical exam did not correlate with the CT findings and that there was no free air or signs of inflammation to the cecum. The patient had a follow up CT scan today which shows a  Left  portal vein thrombosis. The main portal vein, superior mesenteric and splenic veins were patent. There was mild thickening of the terminal ileum and cecum with some surrounding inflammation.   The patient tells me that she delivered a child 5 months ago. Approximately a month after that she had a stroke and at that point was started on Eliquis. The etiology of her blood clot was treated to her pregnancy according to the patient. She denies any previous history of DVT. She is unaware of any family history of clotting disorders. She began having abdominal pain approximately 5 days ago. This was sudden in onset. This was generalized abdominal pain. She currently has some associated nausea. She was mid-to the hospital. She had blood in her stool and therefore her Eliquis was discontinued although she tells me this has been restarted. The pain is poorly localized in her abdomen.  Past Medical History  Diagnosis Date  . Interstitial cystitis     pain from this was reason for starting opioids age 70.   . Chronic pelvic pain in female   . Headache(784.0)   . Pyelonephritis affecting pregnancy in first trimester July 2015  .  Depression 2007  . Opioid dependence     to Rx opioids.  switched to Methadone after second child born in 04/2014  . Pneumonia 03/2014  . Seizures   . Stroke 05/2014    presented with right sided weakness.   . Numbness     right side  . Dizziness 07/18/14  . Confusion   . 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 Preeclampsia 2012  . Miscarriage 2005    Family History  Problem Relation Age of Onset  . Diabetes Mother   . Heart disease Mother   . Hypertension Father   . Diabetes Maternal Grandmother   . Hypertension Maternal Grandmother   . Stroke Maternal Grandmother   . Diabetes Maternal Grandfather   . Hypertension Maternal Grandfather   . Diabetes Paternal Grandmother   . Diabetes Paternal Grandfather     SOCIAL HISTORY: History  Substance Use Topics  . Smoking status: Current Every Day Smoker -- 0.50 packs/day for 8 years    Types: Cigarettes  . Smokeless tobacco: Never Used     Comment: cutting back  . Alcohol Use: No    Allergies  Allergen Reactions  . Ativan [Lorazepam] Other (See Comments)    " I don't remember anything."  . Bee Venom Swelling  . Ciprofloxacin Diarrhea and Nausea And Vomiting    Current Facility-Administered Medications  Medication Dose Route Frequency Provider Last Rate Last Dose  . 0.9 % NaCl with KCl 20 mEq/ L  infusion   Intravenous Continuous Prashant K  Candiss Norse, MD 100 mL/hr at 10/11/14 1425    . acetaminophen (TYLENOL) tablet 650 mg  650 mg Oral Q6H PRN Melton Alar, PA-C       Or  . acetaminophen (TYLENOL) suppository 650 mg  650 mg Rectal Q6H PRN Melton Alar, PA-C      . alum & mag hydroxide-simeth (MAALOX/MYLANTA) 200-200-20 MG/5ML suspension 30 mL  30 mL Oral Q6H PRN Melton Alar, PA-C   30 mL at 10/10/14 1845  . apixaban (ELIQUIS) tablet 5 mg  5 mg Oral BID Lauren D Bajbus, RPH   5 mg at 10/11/14 1012  . FLUoxetine (PROZAC) capsule 20 mg  20 mg Oral Daily Melton Alar, PA-C   20 mg at 10/11/14 1012  . iohexol (OMNIPAQUE) 300  MG/ML solution 100 mL  100 mL Intravenous Once PRN Medication Radiologist, MD      . ketorolac (TORADOL) 30 MG/ML injection 30 mg  30 mg Intravenous Q6H PRN Melton Alar, PA-C   30 mg at 10/11/14 1046  . levETIRAcetam (KEPPRA) 500 mg in sodium chloride 0.9 % 100 mL IVPB  500 mg Intravenous Q12H Marianne L York, PA-C   500 mg at 10/11/14 1318  . Linaclotide (LINZESS) capsule 145 mcg  145 mcg Oral Daily Milus Banister, MD   145 mcg at 10/11/14 1012  . magnesium hydroxide (MILK OF MAGNESIA) suspension 30 mL  30 mL Oral BID Milus Banister, MD   30 mL at 10/10/14 2115  . methadone (DOLOPHINE) tablet 85 mg  85 mg Oral Daily Melton Alar, PA-C   85 mg at 10/11/14 1012  . methylnaltrexone (RELISTOR) injection 12 mg  12 mg Subcutaneous QODAY Thurnell Lose, MD   12 mg at 10/09/14 1046  . metoCLOPramide (REGLAN) injection 10 mg  10 mg Intravenous Q6H PRN Melton Alar, PA-C   10 mg at 10/11/14 0233  . nicotine (NICODERM CQ - dosed in mg/24 hours) patch 21 mg  21 mg Transdermal Daily Melton Alar, PA-C   21 mg at 10/11/14 1012  . ondansetron (ZOFRAN) tablet 4 mg  4 mg Oral Q6H PRN Melton Alar, PA-C   4 mg at 10/10/14 1513   Or  . ondansetron (ZOFRAN) injection 4 mg  4 mg Intravenous Q6H PRN Melton Alar, PA-C   4 mg at 10/11/14 1027    REVIEW OF SYSTEMS: Valu.Nieves ] denotes positive finding; [  ] denotes negative finding CARDIOVASCULAR:  [ ]  chest pain   [ ]  chest pressure   [ ]  palpitations   [ ]  orthopnea   [ ]  dyspnea on exertion   [ ]  claudication   [ ]  rest pain   [ ]  DVT   [ ]  phlebitis PULMONARY:   [ ]  productive cough   [ ]  asthma   [ ]  wheezing NEUROLOGIC:   [ ]  weakness  [ ]  paresthesias  [ ]  aphasia  [ ]  amaurosis  [ ]  dizziness HEMATOLOGIC:   [ ]  bleeding problems   Valu.Nieves ] clotting disorders MUSCULOSKELETAL:  [ ]  joint pain   [ ]  joint swelling [ ]  leg swelling GASTROINTESTINAL: Valu.Nieves ]  blood in stool  [ ]   hematemesis GENITOURINARY:  [ ]   dysuria  [ ]   hematuria PSYCHIATRIC:  [ ]   history of major depression INTEGUMENTARY:  [ ]  rashes  [ ]  ulcers CONSTITUTIONAL:  [ ]  fever   [ ]  chills  PHYSICAL EXAM: Filed Vitals:  10/10/14 1659 10/10/14 2101 10/11/14 0541 10/11/14 1334  BP: 113/66 111/62 154/92 132/66  Pulse: 62 65 64 76  Temp: 98.4 F (36.9 C) 98.1 F (36.7 C) 99.8 F (37.7 C) 98.4 F (36.9 C)  TempSrc: Oral Oral Oral Oral  Resp: 16 20 18 18   Height:      Weight:      SpO2: 99% 99% 100% 97%   Body mass index is 31.09 kg/(m^2). GENERAL: The patient is a well-nourished female, in no acute distress. The vital signs are documented above. CARDIAC: There is a regular rate and rhythm.  VASCULAR: I do not detect carotid bruits. She has palpable dorsalis pedis and posterior tibial pulses bilaterally. PULMONARY: There is good air exchange bilaterally without wheezing or rales. ABDOMEN: she has normal pitch bowel sounds. Her abdomen is diffusely tender but there is no guarding. MUSCULOSKELETAL: There are no major deformities. NEUROLOGIC: No focal weakness or paresthesias are detected. SKIN: There are no ulcers or rashes noted. PSYCHIATRIC: The Patient appears to be in significant pain.  DATA:  Lab Results  Component Value Date   WBC 8.0 10/11/2014   HGB 10.0* 10/11/2014   HCT 34.4* 10/11/2014   MCV 66.2* 10/11/2014   PLT 212 10/11/2014   Lab Results  Component Value Date   NA 136 10/10/2014   K 3.8 10/10/2014   CL 109 10/10/2014   CO2 22 10/10/2014   Lab Results  Component Value Date   CREATININE 0.64 10/10/2014   Lab Results  Component Value Date   INR 1.16 08/05/2014   INR 1.32 06/12/2014   INR 1.10 06/11/2014   CT ABDOMEN: CT of the abdomen on 10/08/2014 showed pneumatosis of the cecum and ascending colon. There did not appear to be free air in the peritoneum. There was also possibly a small bubble of air in the portal vein.  CT of the abdomen today shows occlusive clot in the left portal vein. The main portal, CP mesenteric and splenic  veins were patent. There was mild thickening of the terminal ileum and cecum with surrounding inflammation.  I have reviewed these films with radiology.  MEDICAL ISSUES:  THROMBOSIS OF THE LEFT PORTAL VEIN: I would recommend anticoagulation with intravenous heparin and will consult pharmacy for this. I will hold her Eliquis into her abdominal pain has resolved. If her abdominal pain does not improve she will likely need to be evaluated again by general surgery. The etiology of her left portal vein thrombosis is not clear although she could be hypercoagulable from her recent pregnancy. Regardless, at such a young age at some point she will need a hypercoagulable workup. From a vascular standpoint there is really nothing else to do short of anticoagulation. However if her abdominal pain worsens or she could require reevaluation by general surgery.  Deitra Mayo [facility] [place]: [phone]

## 2014-10-12 DIAGNOSIS — Z86718 Personal history of other venous thrombosis and embolism: Secondary | ICD-10-CM

## 2014-10-12 DIAGNOSIS — D500 Iron deficiency anemia secondary to blood loss (chronic): Secondary | ICD-10-CM

## 2014-10-12 DIAGNOSIS — Z7901 Long term (current) use of anticoagulants: Secondary | ICD-10-CM

## 2014-10-12 DIAGNOSIS — I81 Portal vein thrombosis: Secondary | ICD-10-CM | POA: Insufficient documentation

## 2014-10-12 DIAGNOSIS — R569 Unspecified convulsions: Secondary | ICD-10-CM

## 2014-10-12 DIAGNOSIS — K625 Hemorrhage of anus and rectum: Secondary | ICD-10-CM

## 2014-10-12 DIAGNOSIS — D509 Iron deficiency anemia, unspecified: Secondary | ICD-10-CM

## 2014-10-12 LAB — COMPREHENSIVE METABOLIC PANEL
ALT: 26 U/L (ref 14–54)
AST: 16 U/L (ref 15–41)
Albumin: 2.8 g/dL — ABNORMAL LOW (ref 3.5–5.0)
Alkaline Phosphatase: 41 U/L (ref 38–126)
Anion gap: 5 (ref 5–15)
BUN: <5 mg/dL — ABNORMAL LOW (ref 6–20)
CO2: 24 mmol/L (ref 22–32)
Calcium: 7.6 mg/dL — ABNORMAL LOW (ref 8.9–10.3)
Chloride: 109 mmol/L (ref 101–111)
Creatinine, Ser: 0.69 mg/dL (ref 0.44–1.00)
GFR calc Af Amer: >60 mL/min (ref >60)
GFR calc non Af Amer: >60 mL/min (ref >60)
Glucose, Bld: 89 mg/dL (ref 65–99)
Potassium: 3.9 mmol/L (ref 3.5–5.1)
Sodium: 138 mmol/L (ref 135–145)
Total Bilirubin: 0.4 mg/dL (ref 0.3–1.2)
Total Protein: 4.8 g/dL — ABNORMAL LOW (ref 6.5–8.1)

## 2014-10-12 LAB — APTT
aPTT: 62 seconds — ABNORMAL HIGH (ref 24–37)
aPTT: 75 seconds — ABNORMAL HIGH (ref 24–37)
aPTT: 79 seconds — ABNORMAL HIGH (ref 24–37)

## 2014-10-12 LAB — CBC
HCT: 27.8 % — ABNORMAL LOW (ref 36.0–46.0)
Hemoglobin: 8.2 g/dL — ABNORMAL LOW (ref 12.0–15.0)
MCH: 19.4 pg — ABNORMAL LOW (ref 26.0–34.0)
MCHC: 29.5 g/dL — ABNORMAL LOW (ref 30.0–36.0)
MCV: 65.9 fL — ABNORMAL LOW (ref 78.0–100.0)
Platelets: 185 10*3/uL (ref 150–400)
RBC: 4.22 MIL/uL (ref 3.87–5.11)
RDW: 20.8 % — ABNORMAL HIGH (ref 11.5–15.5)
WBC: 5.8 10*3/uL (ref 4.0–10.5)

## 2014-10-12 LAB — HEPARIN LEVEL (UNFRACTIONATED)
Heparin Unfractionated: 0.99 IU/mL — ABNORMAL HIGH (ref 0.30–0.70)
Heparin Unfractionated: 0.89 IU/mL — ABNORMAL HIGH (ref 0.30–0.70)
Heparin Unfractionated: 0.72 IU/mL — ABNORMAL HIGH (ref 0.30–0.70)

## 2014-10-12 LAB — ANTITHROMBIN III: AntiThromb III Func: 104 % (ref 75–120)

## 2014-10-12 MED ORDER — MAGNESIUM HYDROXIDE 400 MG/5ML PO SUSP
30.0000 mL | Freq: Every day | ORAL | Status: DC
  Administered 2014-10-13 – 2014-10-14 (×2): 30 mL via ORAL
  Filled 2014-10-12 (×4): qty 30

## 2014-10-12 MED ORDER — SODIUM CHLORIDE 0.9 % IV SOLN
510.0000 mg | Freq: Once | INTRAVENOUS | Status: AC
  Administered 2014-10-13: 510 mg via INTRAVENOUS
  Filled 2014-10-12 (×3): qty 17

## 2014-10-12 NOTE — Plan of Care (Signed)
Problem: Phase I Progression Outcomes Goal: Initial discharge plan identified Outcome: Completed/Met Date Met:  10/12/14 To return home

## 2014-10-12 NOTE — Progress Notes (Signed)
ANTICOAGULATION CONSULT NOTE - Follow Up Consult  Pharmacy Consult for Heparin  Indication: Portal Vein Thrombosis  Allergies  Allergen Reactions  . Ativan [Lorazepam] Other (See Comments)    " I don't remember anything."  . Bee Venom Swelling  . Ciprofloxacin Diarrhea and Nausea And Vomiting    Patient Measurements: Height: 5\' 2"  (157.5 cm) Weight: 170 lb (77.111 kg) IBW/kg (Calculated) : 50.1  Vital Signs: Temp: 98.6 F (37 C) (08/03 0500) Temp Source: Oral (08/03 0500) BP: 107/65 mmHg (08/03 0500) Pulse Rate: 62 (08/03 0500)  Labs:  Recent Labs  10/10/14 0600 10/11/14 0555 10/11/14 1543 10/12/14 0615  HGB 9.3* 10.0*  --  8.2*  HCT 32.4* 34.4*  --  27.8*  PLT 231 212  --  185  APTT  --   --   --  62*  LABPROT  --   --  18.5*  --   INR  --   --  1.54*  --   HEPARINUNFRC  --   --   --  0.99*  CREATININE 0.64  --   --  0.69   Estimated Creatinine Clearance: 103.4 mL/min (by C-G formula based on Cr of 0.69).  Assessment: Sub-therapeutic aPTT at 62 (using aPTT to dose for now given Apixaban influence on anti-Xa levels). Current anti-Xa is 0.99. No issues per RN.   Goal of Therapy:  Heparin level 0.3-0.7 units/ml aPTT 66-102 seconds Monitor platelets by anticoagulation protocol: Yes   Plan:  -Increase heparin to 1250 units/hr -1500 HL/aPTT -Daily CBC/HL/aPTT -Monitor for bleeding  Abran Duke 10/12/2014,7:33 AM

## 2014-10-12 NOTE — Progress Notes (Signed)
   VASCULAR SURGERY ASSESSMENT & PLAN:  * Still with abdominal pain.   * Continue heparin for clot in left portal vein.    SUBJECTIVE: Abdominal pain "no better" although she seems a little more comfortable.   PHYSICAL EXAM: Filed Vitals:   10/11/14 0541 10/11/14 1334 10/11/14 2105 10/12/14 0500  BP: 154/92 132/66 117/71 107/65  Pulse: 64 76 60 62  Temp: 99.8 F (37.7 C) 98.4 F (36.9 C) 98.8 F (37.1 C) 98.6 F (37 C)  TempSrc: Oral Oral Oral Oral  Resp: 18 18 17 18   Height:      Weight:      SpO2: 100% 97% 99% 93%   Abdomen: no guarding.  LABS: Lab Results  Component Value Date   WBC 5.8 10/12/2014   HGB 8.2* 10/12/2014   HCT 27.8* 10/12/2014   MCV 65.9* 10/12/2014   PLT 185 10/12/2014   Lab Results  Component Value Date   CREATININE 0.69 10/12/2014   Lab Results  Component Value Date   INR 1.54* 10/11/2014    Principal Problem:   Pneumatosis of intestines Active Problems:   Current smoker   Anemia, iron deficiency   Seizure   Methadone maintenance therapy patient    Gae Gallop Beeper: [phone] 10/12/2014

## 2014-10-12 NOTE — Progress Notes (Addendum)
ANTICOAGULATION CONSULT NOTE - Follow-Up Consult  Pharmacy Consult for heparin  Indication: portal vein thrombosis  Allergies  Allergen Reactions  . Ativan [Lorazepam] Other (See Comments)    " I don't remember anything."  . Bee Venom Swelling  . Ciprofloxacin Diarrhea and Nausea And Vomiting    Patient Measurements: Height:  (157.5 cm) Weight: 170 lb (77.111 kg) IBW/kg (Calculated) : 50.1 Heparin Dosing Weight: 67 kg  Vital Signs: Temp: 98.6 F (37 C) (08/03 0500) Temp Source: Oral (08/03 0500) BP: 107/65 mmHg (08/03 0500) Pulse Rate: 62 (08/03 0500)  Labs:  Recent Labs  10/10/14 0600 10/11/14 0555 10/11/14 1543 10/12/14 0615 10/12/14 1458  HGB 9.3* 10.0*  --  8.2*  --   HCT 32.4* 34.4*  --  27.8*  --   PLT 231 212  --  185  --   APTT  --   --   --  62* 75*  LABPROT  --   --  18.5*  --   --   INR  --   --  1.54*  --   --   HEPARINUNFRC  --   --   --  0.99* 0.89*  CREATININE 0.64  --   --  0.69  --     Estimated Creatinine Clearance: 103.4 mL/min (by C-G formula based on Cr of 0.69).   Medical History: Past Medical History  Diagnosis Date  . Interstitial cystitis     pain from this was reason for starting opioids age 65.   . Chronic pelvic pain in female   . Headache(784.0)   . Pyelonephritis affecting pregnancy in first trimester July 2015  . Depression 2007  . Opioid dependence     to Rx opioids.  switched to Methadone after second child born in 04/2014  . Pneumonia 03/2014  . Seizures   . Stroke 05/2014    presented with right sided weakness.   . Numbness     right side  . Dizziness 07/18/14  . Confusion   . 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 Preeclampsia 2012  . Miscarriage 2005    Assessment: 24yo female with hx of stroke, sz, on Eliquis PTA. She stopped taking Eliquis a few days prior to admission. She was admitted with abdominal pain, CT abdomen showed a thrombosis of L portal vein. Suspect possible poor adherence w/ anticoagulation,  hypercoagulable panel pending.  HL remains elevated, likely as result of recent Eliquis aPTT now therapeutic at 75 seconds after AM rate increase, heparin now at 1250 units/hr  Goal of Therapy:  Heparin level 0.3-0.7 units/ml aPTT 66-102 seconds Monitor platelets by anticoagulation protocol: Yes   Plan:  -Continue heparin at 1250 units/hr -2100 aPTT/HL (to confirm) -Monitor daily aPTT/HL, CBC, s/sx of bleeding -F/u hypercoag panel, plan for PO anticoag  Waynette Buttery, PharmD Clinical Pharmacist Pager: [phone] 10/12/2014 4:27 PM ______________________________________________________ Addendum: HL trending down, approaching therapeutic range, now 0.72 aPTT remains therapeutic at 79 seconds No bleeding or issues reported  Goal of Therapy:  Heparin level 0.3-0.7 units/ml aPTT 66-102 seconds Monitor platelets by anticoagulation protocol: Yes  Plan: -Continue heparin at 1250 units/hr -May discontinue monitoring aPTT as HL expected to be in therapeutic range in AM -Monitor daily HL, CBC, s/sx of bleeding -F/u hypercoag panel, plan for PO anticoag  Waynette Buttery, PharmD Clinical Pharmacist Pager: [phone] 10/12/2014 10:31 PM

## 2014-10-12 NOTE — Progress Notes (Signed)
Paged oncall NP, Made you aware of pt having scant amount of bleeding from vagina. Will continue to monitor pt.

## 2014-10-12 NOTE — Consult Note (Signed)
[place]  Telephone:([phone]) [address] CONSULTATION NOTE  Victoria Alvarez                                MR#: [id number]  DOB: October 04, 1990                       CSN#: [id number]  Referring MD: [facility]    Patient Care Team: Boykin Nearing, MD as PCP - General (Family Medicine)  Reason for Consult:  Hypercoagulable state with recurrent thrombosis  I have seen the patient, examined her and edited the notes as follows   IRS:WNIOEVO Victoria Alvarez is a 24 y.o. 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 Denies lower extremity pain, swelling, tenderness or erythema. Denies pre-syncopal episodes, palpitations or hemoptysis. Denies any bleeding issues such as epistaxis, hematemesis, hematuria. She had blood in her stools 5 days prior for which she had placed Eliquis on hold.  She is also having some vaginal bleeding despite having finished her period a week ago. She reports a history of seizures. Denies prior diagnosis of cancer. Reports history of tobacco, alcohol. She was on Methadone on admission. She denies over the counter supplements. Denies taking birth control pills. Her initial thrombotic event occurred 4 weeks post partum (she is now 5 months post C section). She had a history of preeclampsia in 2004 and miscarriage in 2005. She had an older 47 year old child. There is no family history of blood clots or miscarriages. She is obese. Denies sedentary lifestyle. No recent long distance trips. Patient states that has never had a hematological evaluation prior to this admission. Never had a bone marrow biopsy.  CT of the abdomen  On 7/28 revealed occlusive left portal vein thrombosis with  patent main portal, superior mesenteric and splenic veins   There was mild thickening of the terminal ileum and cecum with some surrounding inflammation. No evidence of arterial occlusion, perforation or abscess. No echo performed to date.Hypercoagulable workup not yet ordered. She was evaluated by Vascular Surgery on 8/2, who recommended IV Heparinization by Pharmacy. Eliquis is on hold. CBC is remarkable for mild anemia, with normal platelets and WBC. INR is 1.54. We were requested to see the patient in consultation with recommendations.  PMH:  Past Medical History  Diagnosis Date  . Interstitial cystitis     pain from this was reason for starting opioids age 75.   . Chronic pelvic pain in female   . Headache(784.0)   . Pyelonephritis affecting pregnancy in first trimester July 2015  . Depression 2007  . Opioid dependence     to Rx opioids.  switched to Methadone after second child born in 04/2014  . Pneumonia 03/2014  . Seizures   . Stroke 05/2014    presented with right sided weakness.   . Numbness     right side  . Dizziness 07/18/14  . Confusion   . 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 Preeclampsia 2012  . Miscarriage 2005    Surgeries:  Past Surgical History  Procedure Laterality Date  . Vagina surgery  ~ 2011    benign vaginal tumor removed  at [facility].   . Cesarean section N/A 05/06/2014    Procedure: CESAREAN SECTION;  Surgeon: Truett Mainland, DO;  Location: [facility];  Service: Obstetrics;  Laterality: N/A;  . Inguinal hernia repair Left 2008    Allergies:  Allergies  Allergen Reactions  . Ativan [Lorazepam] Other (See Comments)    " I don't remember anything."  . Bee Venom Swelling  . Ciprofloxacin Diarrhea and Nausea And Vomiting    Medications:   Prior to Admission:  Prescriptions prior to admission  Medication Sig Dispense Refill Last Dose  . apixaban (ELIQUIS) 5 MG TABS tablet Take 1 tablet (5 mg total) by mouth 2 (two) times daily. 60 tablet 3 Past Week at  Unknown time  . EPINEPHrine 0.3 mg/0.3 mL IJ SOAJ injection Inject 0.3 mLs (0.3 mg total) into the muscle once. 1 Device 1 not used  . FLUoxetine (PROZAC) 20 MG capsule Take 1 capsule (20 mg total) by mouth daily. 30 capsule 3 Past Week at Unknown time  . levETIRAcetam (KEPPRA) 500 MG tablet Take 1 tablet (500 mg total) by mouth 2 (two) times daily. 60 tablet 3 Past Week at Unknown time    OVF:IEPPIRJJOACZY **OR** acetaminophen, alum & mag hydroxide-simeth, ketorolac, metoCLOPramide (REGLAN) injection, morphine injection, ondansetron **OR** ondansetron (ZOFRAN) IV  ROS: Constitutional: Denies fevers, chills or abnormal night sweats Eyes: Denies blurriness of vision, double vision or watery eyes Ears, nose, mouth, throat, and face: Denies mucositis or sore throat Respiratory: Denies cough, dyspnea or wheezes Cardiovascular: Denies palpitation, chest discomfort or lower extremity swelling Gastrointestinal:  Denies nausea, heartburn . She has constipation, chronic. Abdominal pain continues to be present. She reports weight gain since March  Skin: Denies abnormal skin rashes Lymphatics: Denies new lymphadenopathy or easy bruising Neurological: Denies numbness, tingling or new weaknesses Behavioral/Psych: Mood is stable, no new changes. She has depression. All other systems were reviewed with the patient and are negative.   Family History:    Family History  Problem Relation Age of Onset  . Diabetes Mother   . Heart disease Mother   . Hypertension Father   . Diabetes Maternal Grandmother   . Hypertension Maternal Grandmother   . Stroke Maternal Grandmother   . Diabetes Maternal Grandfather   . Hypertension Maternal Grandfather   . Diabetes Paternal Grandmother   . Diabetes Paternal Grandfather     No family history of hematological  disorders.  Social History:  reports that she has been smoking Cigarettes.  She has a 4 pack-year smoking history. She has never used smokeless  tobacco. She reports that she does not drink alcohol or use illicit drugs. Single. 2 children.LIves in [place].    Physical Exam    ECOG PERFORMANCE STATUS: 2  Filed Vitals:   10/12/14 0500  BP: 107/65  Pulse: 62  Temp: 98.6 F (37 C)  Resp: 18   Filed Weights   10/07/14 2046  Weight: 170 lb (77.111 kg)    GENERAL:alert, no distress and tearful. She is obese SKIN: skin color, texture, turgor are normal, no rashes or significant lesions EYES: normal, conjunctiva are pink and non-injected, sclera clear OROPHARYNX:no exudate, no erythema and lips, buccal mucosa, and tongue normal  NECK: supple, thyroid normal size, non-tender, without nodularity LYMPH:  no palpable lymphadenopathy in the cervical, axillary or inguinal LUNGS: clear to auscultation and percussion with normal breathing effort HEART: regular rate & rhythm and no murmurs and no lower extremity edema ABDOMEN: soft, diffusely tender, and decreased bowel sounds Musculoskeletal:no  cyanosis of digits and no clubbing  PSYCH: alert & oriented x 3 with fluent speech Musculoskeletal: Denies back pain, myalgias or joint swelling NEURO: no focal motor/sensory deficits   Labs:  CBC   Recent Labs Lab 10/06/14 2136 10/07/14 2125 10/09/14 0633 10/09/14 1805 10/10/14 0600 10/11/14 0555 10/12/14 0615  WBC 5.7 7.3 10.9*  --  9.5 8.0 5.8  HGB 9.2* 10.2* 9.0* 9.0* 9.3* 10.0* 8.2*  HCT 31.0* 33.7* 30.5* 31.2* 32.4* 34.4* 27.8*  PLT 226 215 201  --  231 212 185  MCV 64.6* 64.6* 64.8*  --  65.2* 66.2* 65.9*  MCH 19.2* 19.5* 19.1*  --  18.7* 19.2* 19.4*  MCHC 29.7* 30.3 29.5*  --  28.7* 29.1* 29.5*  RDW 20.4* 20.6* 20.6*  --  20.7* 20.5* 20.8*  LYMPHSABS 2.6 1.0  --   --   --   --   --   MONOABS 0.4 0.3  --   --   --   --   --   EOSABS 0.1 0.0  --   --   --   --   --   BASOSABS 0.1 0.0  --   --   --   --   --      CMP    Recent Labs Lab 10/06/14 1751 10/07/14 2125 10/09/14 0633 10/10/14 0600  10/12/14 0615  NA 138 139 137 136 138  K 4.2 3.6 3.3* 3.8 3.9  CL 105 106 106 109 109  CO2 22 22 24 22 24   GLUCOSE 88 109* 113* 106* 89  BUN 14 10 9  <5* <5*  CREATININE 0.69 0.80 0.87 0.64 0.69  CALCIUM 9.6 9.5 7.9* 8.2* 7.6*  MG  --   --   --  1.9  --   AST 37 38  --   --  16  ALT 45 47  --   --  26  ALKPHOS 62 51  --   --  41  BILITOT 0.4 0.5  --   --  0.4        Component Value Date/Time   BILITOT 0.4 10/12/2014 0615     Recent Labs Lab 10/11/14 1543  INR 1.54*    No results for input(s): DDIMER in the last 72 hours.  Anemia panel:  No results for input(s): VITAMINB12, FOLATE, FERRITIN, TIBC, IRON, RETICCTPCT in the last 72 hours.  Imaging Studies: I reviewed the imaging  Ct Abdomen Pelvis Wo Contrast  10/08/2014   CLINICAL DATA:  Generalized abdominal pain, greatest in the left lower quadrant.  EXAM: CT ABDOMEN AND PELVIS WITHOUT CONTRAST  TECHNIQUE: Multidetector CT imaging of the abdomen and pelvis was performed following the standard protocol without IV contrast.  COMPARISON:  07/17/2013  FINDINGS: There is pneumatosis of the cecum and ascending colon with a generous volume of air in the wall. There also is a little air which appears to extend toward the undersurface of the gallbladder and liver, perhaps in the hepatoduodenal ligament. There is a small bubble of air in the porta hepatis which may reside within the portal vein. No frank free intraperitoneal air is evident. Despite the rather generous volume of pneumatosis, there is no significant inflammatory change about the cecum or ascending colon. There is no bowel obstruction. Appendix is normal.  There are unremarkable unenhanced appearances of the liver, spleen, pancreas, adrenals and kidneys. Uterus and ovaries appear normal. There is no significant abnormality in the lower chest. There is no significant musculoskeletal abnormality.  IMPRESSION: Pneumatosis of the cecum and ascending colon. There are a few bubbles  of air extending up toward the under surface of the gallbladder but the air does not appear to be free in the peritoneum. There also may be a bubble of air in the portal vein. Despite the generous volume of extraluminal air, there is little or no inflammatory change. There is no obstruction. There is no ascites. These results were called by telephone at the time of interpretation on 10/08/2014 at 2:51 am to Dr. Dina Rich, who verbally acknowledged these results.   Electronically Signed   By: Andreas Newport M.D.   On: 10/08/2014 02:57   Dg Chest 2 View  10/06/2014   CLINICAL DATA:  Abdominal pain and shortness of breath for 5 months.  EXAM: CHEST  2 VIEW  COMPARISON:  Chest radiograph 06/12/2014  FINDINGS: The heart is normal in size. Mild diffuse bronchial thickening. Pulmonary vasculature is normal. No consolidation, pleural effusion, or pneumothorax. No acute osseous abnormalities are seen.  IMPRESSION: Mild diffuse bronchial thickening, can be seen with asthma or bronchitis.   Electronically Signed   By: Jeb Levering M.D.   On: 10/06/2014 21:29   Ct Abdomen Pelvis W Contrast  10/11/2014     COMPARISON:  Abdominal CT 10/08/2014 and 07/17/2013  FINDINGS: Lower chest: Increased volume loss, ground-glass opacity and linear densities within the right middle and lower lobes may reflect atelectasis or infection. Small right pleural effusion.  Hepatobiliary: There is left portal vein thrombosis with scattered low-density throughout the left hepatic lobe. No suspicious hepatic lesions demonstrated. The right and main portal veins are patent. No evidence of gallstones, gallbladder wall thickening or biliary dilatation.  Pancreas: Unremarkable. No pancreatic ductal dilatation or surrounding inflammatory changes.  Spleen: Normal in size without focal abnormality.  Adrenals/Urinary Tract: Both adrenal glands appear normal.The kidneys appear normal without evidence of urinary tract calculus, suspicious lesion or  hydronephrosis. No bladder abnormalities are seen.  Stomach/Bowel: The stomach and proximal small bowel appear normal. There is distal small bowel wall thickening with mild surrounding inflammatory change. There is also mild wall thickening of the cecum. No residual pneumatosis identified.The appendix appears normal. There is small amount of free fluid in the right upper pelvis.  Vascular/Lymphatic: There are no enlarged abdominal or pelvic lymph nodes. As above, there is left portal vein thrombosis. The right and main portal veins are patent. The superior mesenteric and splenic veins are patent. No significant arterial findings.  Reproductive: Unremarkable.  Other: Postsurgical changes in the left groin without evidence of recurrent hernia.  Musculoskeletal: No acute or significant osseous findings.  IMPRESSION: 1. Occlusive left portal vein thrombosis, likely secondary to discontinued anti coagulation. The main portal, superior mesenteric and splenic veins are patent. 2. Mild wall thickening of the terminal ileum and cecum with surrounding inflammation, possibly secondary to infectious enteritis, vasculitis or venous thrombosis. No evidence of arterial occlusion, perforation or abscess. 3. Increased predominate linear opacity at the right lung base, likely atelectasis. 4. These results were called by telephone at the time of interpretation on 10/11/2014 at 2:00 pm to Dr. Lala Lund , who verbally acknowledged these results.   Electronically Signed   By: Richardean Sale M.D.   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 CT of the abdome revealed occlusive left  portal vein thrombosis with patent main portal, superior mesenteric and splenic veins No echo performed to date. Hypercoagulable workup was done in March and no abnormalities were found. I will order extra testing to exclude PNH & MPD IV Heparinization initiated by Pharmacy. Eliquis is on hold.  Risk factors include recent C section, recent pregnancy, tobacco use, obesity, substance abuse including cigarettes I recommend continue IV heparin until her bowel function returns before switching her to oral agents  Microcytic anemia In the setting of recent rectal bleeding while on Eliquis The most likely cause of her anemia is due to chronic blood loss. We discussed some of the risks, benefits, and alternatives of intravenous iron infusions. The patient is symptomatic from anemia and the iron level is critically low. She tolerated oral iron supplement poorly and desires to achieved higher levels of iron faster for adequate hematopoesis. Some of the side-effects to be expected including risks of infusion reactions, phlebitis, headaches, nausea and fatigue.  The patient is willing to proceed. Goal is to keep ferritin level greater than 50. I will order one dose feraheme tomorrow  Pneumatosis in the intestinal tract Likely due to ischemic event from recent thrombosis She is on clear liquid diet and pain management Hopefully surgery can be avoided  Full Code  Other medical issues including chronic pain with narcotic dependence as per primary team and specialties. Will follow Tentative return appointment in the clinic set for 11/01/14. Her husband has a lot of questions regarding length of stay and prognosis and I tried to address all his questions  Rondel Jumbo, PA-C 10/12/2014 9:50 AM  Mannie Ohlin, MD 10/12/2014

## 2014-10-12 NOTE — Progress Notes (Signed)
PATIENT DETAILS Name: Victoria Alvarez Age: 24 y.o. Sex: female Date of Birth: 1990-10-24 Admit Date: 10/07/2014 Admitting Physician Theodis Blaze, MD BRA:[id number], Lennox Laity, MD  Subjective: Still complains of abd pain. Wants to eat.   Assessment/Plan: Principal Problem: Abd pain: Initially felt to be from constipation and pneumatosis intestinalis, however since patient continued to persistent pain inspite of treatment of constipation, repeat CT done on 8/2 now shows left portal venous thrombosis. Given prior history of cerebral venous sinus thrombosis-likely patient has a underlying hypercoagulable state, and given the fact that she is a chronic Eliquis therapy-suspect that she may not have been very compliant. Patient also has chronic abdominal pain from interstitial cystitis and is maintained on methadone. Continue supportive care with IV Heparin, advance diet. Abdomen is soft  Left portal venous sinus thrombosis: Continue IV heparin. Hypercoagulable panel ordered. Await hematology evaluation. Appreciate vascular surgery evaluation. Per gastroenterology, she would likely need lifelong anticoagulation  History of seizure: Continue with Keppra-will change to oral route in the next few days  History of cerebral venous sinus thrombosis: Maintained on Eliquis prior to this admission-previously she was on Coumadin and was noncompliant. Since she has now developed portal venous sinus thrombosis in spite of Eliquis treatment-suspect she may not have been very compliant-does claim that she has been told to hold Eliquis during her menses. Suspect underlying hypercoagulable state-await hypercoagulable  Chronic Constipation:continue Linzess  Anemia: No evidence of overt blood loss, suspect secondary to acute illness. Follow  Chronic pain/history of interstitial cystitis: Continue with methadone.  Disposition: Remain inpatient  Antimicrobial agents  See below  Anti-infectives     Start     Dose/Rate Route Frequency Ordered Stop   10/08/14 0645  piperacillin-tazobactam (ZOSYN) IVPB 3.375 g     3.375 g 100 mL/hr over 30 Minutes Intravenous  Once 10/08/14 0630 10/08/14 0737      DVT Prophylaxis: IV Heparin gtt  Code Status: Full code  Family Communication None at bedside  Procedures: None  CONSULTS:  GI, hematology/oncology, general surgery and vascular surgery  Time spent 30 minutes-Greater than 50% of this time was spent in counseling, explanation of diagnosis, planning of further management, and coordination of care.  MEDICATIONS: Scheduled Meds: . FLUoxetine  20 mg Oral Daily  . levETIRAcetam  500 mg Intravenous Q12H  . Linaclotide  145 mcg Oral Daily  . magnesium hydroxide  30 mL Oral Daily  . methadone  85 mg Oral Daily  . methylnaltrexone  12 mg Subcutaneous QODAY  . nicotine  21 mg Transdermal Daily   Continuous Infusions: . 0.9 % NaCl with KCl 20 mEq / L 100 mL/hr at 10/12/14 0338  . heparin 1,250 Units/hr (10/12/14 0759)   PRN Meds:.acetaminophen **OR** acetaminophen, alum & mag hydroxide-simeth, ketorolac, metoCLOPramide (REGLAN) injection, morphine injection, ondansetron **OR** ondansetron (ZOFRAN) IV    PHYSICAL EXAM: Vital signs in last 24 hours: Filed Vitals:   10/11/14 0541 10/11/14 1334 10/11/14 2105 10/12/14 0500  BP: 154/92 132/66 117/71 107/65  Pulse: 64 76 60 62  Temp: 99.8 F (37.7 C) 98.4 F (36.9 C) 98.8 F (37.1 C) 98.6 F (37 C)  TempSrc: Oral Oral Oral Oral  Resp: 18 18 17 18   Height:      Weight:      SpO2: 100% 97% 99% 93%    Weight change:  Filed Weights   10/07/14 2046  Weight: 77.111 kg (170 lb)  Body mass index is 31.09 kg/(m^2).   Gen Exam: Awake and alert with clear speech. Neck: Supple, No JVD.   Chest: B/L Clear.   CVS: S1 S2 Regular, no murmurs.  Abdomen: soft, BS +, diffusely tender-but soft, non distended.  Extremities: no edema, lower extremities warm to touch. Neurologic: Non  Focal.   Skin: No Rash.   Wounds: N/A.   Intake/Output from previous day:  Intake/Output Summary (Last 24 hours) at 10/12/14 1406 Last data filed at 10/12/14 0600  Gross per 24 hour  Intake 2113.16 ml  Output      0 ml  Net 2113.16 ml     LAB RESULTS: CBC  Recent Labs Lab 10/06/14 2136 10/07/14 2125 10/09/14 0633 10/09/14 1805 10/10/14 0600 10/11/14 0555 10/12/14 0615  WBC 5.7 7.3 10.9*  --  9.5 8.0 5.8  HGB 9.2* 10.2* 9.0* 9.0* 9.3* 10.0* 8.2*  HCT 31.0* 33.7* 30.5* 31.2* 32.4* 34.4* 27.8*  PLT 226 215 201  --  231 212 185  MCV 64.6* 64.6* 64.8*  --  65.2* 66.2* 65.9*  MCH 19.2* 19.5* 19.1*  --  18.7* 19.2* 19.4*  MCHC 29.7* 30.3 29.5*  --  28.7* 29.1* 29.5*  RDW 20.4* 20.6* 20.6*  --  20.7* 20.5* 20.8*  LYMPHSABS 2.6 1.0  --   --   --   --   --   MONOABS 0.4 0.3  --   --   --   --   --   EOSABS 0.1 0.0  --   --   --   --   --   BASOSABS 0.1 0.0  --   --   --   --   --     Chemistries   Recent Labs Lab 10/06/14 1751 10/07/14 2125 10/09/14 0633 10/10/14 0600 10/12/14 0615  NA 138 139 137 136 138  K 4.2 3.6 3.3* 3.8 3.9  CL 105 106 106 109 109  CO2 22 22 24 22 24   GLUCOSE 88 109* 113* 106* 89  BUN 14 10 9  <5* <5*  CREATININE 0.69 0.80 0.87 0.64 0.69  CALCIUM 9.6 9.5 7.9* 8.2* 7.6*  MG  --   --   --  1.9  --     CBG: No results for input(s): GLUCAP in the last 168 hours.  GFR Estimated Creatinine Clearance: 103.4 mL/min (by C-G formula based on Cr of 0.69).  Coagulation profile  Recent Labs Lab 10/11/14 1543  INR 1.54*    Cardiac Enzymes No results for input(s): CKMB, TROPONINI, MYOGLOBIN in the last 168 hours.  Invalid input(s): CK  Invalid input(s): POCBNP No results for input(s): DDIMER in the last 72 hours. No results for input(s): HGBA1C in the last 72 hours. No results for input(s): CHOL, HDL, LDLCALC, TRIG, CHOLHDL, LDLDIRECT in the last 72 hours. No results for input(s): TSH, T4TOTAL, T3FREE, THYROIDAB in the last 72  hours.  Invalid input(s): FREET3 No results for input(s): VITAMINB12, FOLATE, FERRITIN, TIBC, IRON, RETICCTPCT in the last 72 hours. No results for input(s): LIPASE, AMYLASE in the last 72 hours.  Urine Studies No results for input(s): UHGB, CRYS in the last 72 hours.  Invalid input(s): UACOL, UAPR, USPG, UPH, UTP, UGL, UKET, UBIL, UNIT, UROB, ULEU, UEPI, UWBC, URBC, UBAC, CAST, UCOM, BILUA  MICROBIOLOGY: No results found for this or any previous visit (from the past 240 hour(s)).  RADIOLOGY STUDIES/RESULTS: Ct Abdomen Pelvis Wo Contrast  10/08/2014   CLINICAL DATA:  Generalized abdominal pain, greatest in the  left lower quadrant.  EXAM: CT ABDOMEN AND PELVIS WITHOUT CONTRAST  TECHNIQUE: Multidetector CT imaging of the abdomen and pelvis was performed following the standard protocol without IV contrast.  COMPARISON:  07/17/2013  FINDINGS: There is pneumatosis of the cecum and ascending colon with a generous volume of air in the wall. There also is a little air which appears to extend toward the undersurface of the gallbladder and liver, perhaps in the hepatoduodenal ligament. There is a small bubble of air in the porta hepatis which may reside within the portal vein. No frank free intraperitoneal air is evident. Despite the rather generous volume of pneumatosis, there is no significant inflammatory change about the cecum or ascending colon. There is no bowel obstruction. Appendix is normal.  There are unremarkable unenhanced appearances of the liver, spleen, pancreas, adrenals and kidneys. Uterus and ovaries appear normal. There is no significant abnormality in the lower chest. There is no significant musculoskeletal abnormality.  IMPRESSION: Pneumatosis of the cecum and ascending colon. There are a few bubbles of air extending up toward the under surface of the gallbladder but the air does not appear to be free in the peritoneum. There also may be a bubble of air in the portal vein. Despite the  generous volume of extraluminal air, there is little or no inflammatory change. There is no obstruction. There is no ascites. These results were called by telephone at the time of interpretation on 10/08/2014 at 2:51 am to Dr. Dina Rich, who verbally acknowledged these results.   Electronically Signed   By: Andreas Newport M.D.   On: 10/08/2014 02:57   Dg Chest 2 View  10/06/2014   CLINICAL DATA:  Abdominal pain and shortness of breath for 5 months.  EXAM: CHEST  2 VIEW  COMPARISON:  Chest radiograph 06/12/2014  FINDINGS: The heart is normal in size. Mild diffuse bronchial thickening. Pulmonary vasculature is normal. No consolidation, pleural effusion, or pneumothorax. No acute osseous abnormalities are seen.  IMPRESSION: Mild diffuse bronchial thickening, can be seen with asthma or bronchitis.   Electronically Signed   By: Jeb Levering M.D.   On: 10/06/2014 21:29   Ct Abdomen Pelvis W Contrast  10/11/2014   CLINICAL DATA:  Abdominal pain. History of hernia repair, pyelonephritis, seizures, CVA and substance abuse. Patient discontinued Eliquis therapy prior to admission. Initial encounter.  EXAM: CT ABDOMEN AND PELVIS WITH CONTRAST  TECHNIQUE: Multidetector CT imaging of the abdomen and pelvis was performed using the standard protocol following bolus administration of intravenous contrast.  CONTRAST:  100 ml Omnipaque 300.  COMPARISON:  Abdominal CT 10/08/2014 and 07/17/2013  FINDINGS: Lower chest: Increased volume loss, ground-glass opacity and linear densities within the right middle and lower lobes may reflect atelectasis or infection. Small right pleural effusion.  Hepatobiliary: There is left portal vein thrombosis with scattered low-density throughout the left hepatic lobe. No suspicious hepatic lesions demonstrated. The right and main portal veins are patent. No evidence of gallstones, gallbladder wall thickening or biliary dilatation.  Pancreas: Unremarkable. No pancreatic ductal dilatation or  surrounding inflammatory changes.  Spleen: Normal in size without focal abnormality.  Adrenals/Urinary Tract: Both adrenal glands appear normal.The kidneys appear normal without evidence of urinary tract calculus, suspicious lesion or hydronephrosis. No bladder abnormalities are seen.  Stomach/Bowel: The stomach and proximal small bowel appear normal. There is distal small bowel wall thickening with mild surrounding inflammatory change. There is also mild wall thickening of the cecum. No residual pneumatosis identified.The appendix appears normal. There is small  amount of free fluid in the right upper pelvis.  Vascular/Lymphatic: There are no enlarged abdominal or pelvic lymph nodes. As above, there is left portal vein thrombosis. The right and main portal veins are patent. The superior mesenteric and splenic veins are patent. No significant arterial findings.  Reproductive: Unremarkable.  Other: Postsurgical changes in the left groin without evidence of recurrent hernia.  Musculoskeletal: No acute or significant osseous findings.  IMPRESSION: 1. Occlusive left portal vein thrombosis, likely secondary to discontinued anti coagulation. The main portal, superior mesenteric and splenic veins are patent. 2. Mild wall thickening of the terminal ileum and cecum with surrounding inflammation, possibly secondary to infectious enteritis, vasculitis or venous thrombosis. No evidence of arterial occlusion, perforation or abscess. 3. Increased predominate linear opacity at the right lung base, likely atelectasis. 4. These results were called by telephone at the time of interpretation on 10/11/2014 at 2:00 pm to Dr. Lala Lund , who verbally acknowledged these results.   Electronically Signed   By: Richardean Sale M.D.   On: 10/11/2014 14:04   Dg Abd 2 Views  10/09/2014   CLINICAL DATA:  Generalized abdominal pain. Nausea vomiting diarrhea for 2 days  EXAM: ABDOMEN - 2 VIEW  COMPARISON:  CT 10/06/2014, radiograph 10/06/2014   FINDINGS: The oral contrast from CT of 10/06/2014 has progressed into the rectum. There is mild decrease in the volume of stool seen on comparison exam. No dilated loops of large or small bowel. NO intraoperative free air.  IMPRESSION: No evidence of bowel obstruction. Progression of oral contrast the rectum from CT of 10/06/2014.   Electronically Signed   By: Suzy Bouchard M.D.   On: 10/09/2014 09:33   Dg Abd 2 Views  10/06/2014   CLINICAL DATA:  Abdominal pain and diarrhea for 5 months.  EXAM: ABDOMEN - 2 VIEW  COMPARISON:  07/13/2014  FINDINGS: There is no free intra-abdominal air. No dilated bowel loops to suggest obstruction. Moderate volume of stool throughout the colon. No radiopaque calculi. Pelvic phleboliths and surgical clips projecting over the left inguinal region, unchanged. There is chronic widening of the pubic symphysis. No acute osseous abnormalities are seen.  IMPRESSION: Normal abdominal radiographs with moderate volume of colonic stool.   Electronically Signed   By: Jeb Levering M.D.   On: 10/06/2014 21:32   Dg Abd Portable 1v  10/11/2014   CLINICAL DATA:  Generalized abdominal pain, nausea, vomiting and constipation, over the past 4 days. Initial encounter.  EXAM: PORTABLE ABDOMEN - 1 VIEW  COMPARISON:  Abdominal radiograph performed 10/09/2014  FINDINGS: The colon contains a small amount of air but is otherwise empty. No bowel dilatation is seen to suggest obstruction. No free intra-abdominal air is seen, though evaluation for free air is limited on a single supine view.  No acute osseous abnormalities are identified.  IMPRESSION: Colon contains a small amount of air but is otherwise empty. No evidence for bowel obstruction. No radiographic evidence to suggest constipation.   Electronically Signed   By: Garald Balding M.D.   On: 10/11/2014 00:36    Oren Binet, MD  [facility] Pager:[phone]  If 7PM-7AM, please contact night-coverage [email or web address] Password  [email or web address] 10/12/2014, 2:06 PM   LOS: 4 days

## 2014-10-12 NOTE — Progress Notes (Signed)
Daily Rounding Note  10/12/2014, 8:40 AM  LOS: 4 days   SUBJECTIVE:       Waiting on next dose of Morphine for her ongoing abd pain.  Some nausea (improved) no emesis.  NPO. Stools loose. Having vaginal bleeding  OBJECTIVE:         Vital signs in last 24 hours:    Temp:  [98.4 F (36.9 C)-98.8 F (37.1 C)] 98.6 F (37 C) (08/03 0500) Pulse Rate:  [60-76] 62 (08/03 0500) Resp:  [17-18] 18 (08/03 0500) BP: (107-132)/(65-71) 107/65 mmHg (08/03 0500) SpO2:  [93 %-99 %] 93 % (08/03 0500) Last BM Date: 10/12/14 Filed Weights   10/07/14 2046  Weight: 170 lb (77.111 kg)   General: looks well, slightly uncomfortable   Heart: RRR Chest: clear bil.  No labored breathing or cough. Abdomen: active BS, obese, soft, diffusely tender without guard/rebound  Extremities: no CCE Neuro/Psych:  Depressed/flat affect.  Alert.  Oriented x 3.  Moves all 4 limbs.   Lab Results:  Recent Labs  10/10/14 0600 10/11/14 0555 10/12/14 0615  WBC 9.5 8.0 5.8  HGB 9.3* 10.0* 8.2*  HCT 32.4* 34.4* 27.8*  PLT 231 212 185   BMET  Recent Labs  10/10/14 0600 10/12/14 0615  NA 136 138  K 3.8 3.9  CL 109 109  CO2 22 24  GLUCOSE 106* 89  BUN <5* <5*  CREATININE 0.64 0.69  CALCIUM 8.2* 7.6*   LFT  Recent Labs  10/12/14 0615  PROT 4.8*  ALBUMIN 2.8*  AST 16  ALT 26  ALKPHOS 41  BILITOT 0.4   PT/INR  Recent Labs  10/11/14 1543  LABPROT 18.5*  INR 1.54*   Studies/Results: Ct Abdomen Pelvis W Contrast  10/11/2014   CLINICAL DATA:  Abdominal pain. History of hernia repair, pyelonephritis, seizures, CVA and substance abuse. Patient discontinued Eliquis therapy prior to admission. Initial encounter.  EXAM: CT ABDOMEN AND PELVIS WITH CONTRAST  TECHNIQUE: Multidetector CT imaging of the abdomen and pelvis was performed using the standard protocol following bolus administration of intravenous contrast.  CONTRAST:  100 ml Omnipaque  300.  COMPARISON:  Abdominal CT 10/08/2014 and 07/17/2013  FINDINGS: Lower chest: Increased volume loss, ground-glass opacity and linear densities within the right middle and lower lobes may reflect atelectasis or infection. Small right pleural effusion.  Hepatobiliary: There is left portal vein thrombosis with scattered low-density throughout the left hepatic lobe. No suspicious hepatic lesions demonstrated. The right and main portal veins are patent. No evidence of gallstones, gallbladder wall thickening or biliary dilatation.  Pancreas: Unremarkable. No pancreatic ductal dilatation or surrounding inflammatory changes.  Spleen: Normal in size without focal abnormality.  Adrenals/Urinary Tract: Both adrenal glands appear normal.The kidneys appear normal without evidence of urinary tract calculus, suspicious lesion or hydronephrosis. No bladder abnormalities are seen.  Stomach/Bowel: The stomach and proximal small bowel appear normal. There is distal small bowel wall thickening with mild surrounding inflammatory change. There is also mild wall thickening of the cecum. No residual pneumatosis identified.The appendix appears normal. There is small amount of free fluid in the right upper pelvis.  Vascular/Lymphatic: There are no enlarged abdominal or pelvic lymph nodes. As above, there is left portal vein thrombosis. The right and main portal veins are patent. The superior mesenteric and splenic veins are patent. No significant arterial findings.  Reproductive: Unremarkable.  Other: Postsurgical changes in the left groin without evidence of recurrent hernia.  Musculoskeletal: No acute  or significant osseous findings.  IMPRESSION: 1. Occlusive left portal vein thrombosis, likely secondary to discontinued anti coagulation. The main portal, superior mesenteric and splenic veins are patent. 2. Mild wall thickening of the terminal ileum and cecum with surrounding inflammation, possibly secondary to infectious enteritis,  vasculitis or venous thrombosis. No evidence of arterial occlusion, perforation or abscess. 3. Increased predominate linear opacity at the right lung base, likely atelectasis. 4. These results were called by telephone at the time of interpretation on 10/11/2014 at 2:00 pm to Dr. Lala Lund , who verbally acknowledged these results.   Electronically Signed   By: Richardean Sale M.D.   On: 10/11/2014 14:04   Dg Abd Portable 1v  10/11/2014   CLINICAL DATA:  Generalized abdominal pain, nausea, vomiting and constipation, over the past 4 days. Initial encounter.  EXAM: PORTABLE ABDOMEN - 1 VIEW  COMPARISON:  Abdominal radiograph performed 10/09/2014  FINDINGS: The colon contains a small amount of air but is otherwise empty. No bowel dilatation is seen to suggest obstruction. No free intra-abdominal air is seen, though evaluation for free air is limited on a single supine view.  No acute osseous abnormalities are identified.  IMPRESSION: Colon contains a small amount of air but is otherwise empty. No evidence for bowel obstruction. No radiographic evidence to suggest constipation.   Electronically Signed   By: Garald Balding M.D.   On: 10/11/2014 00:36   Scheduled Meds: . FLUoxetine  20 mg Oral Daily  . levETIRAcetam  500 mg Intravenous Q12H  . Linaclotide  145 mcg Oral Daily  . magnesium hydroxide  30 mL Oral BID  . methadone  85 mg Oral Daily  . methylnaltrexone  12 mg Subcutaneous QODAY  . nicotine  21 mg Transdermal Daily   Continuous Infusions: . 0.9 % NaCl with KCl 20 mEq / L 100 mL/hr at 10/12/14 0338  . heparin 1,250 Units/hr (10/12/14 0759)   PRN Meds:.acetaminophen **OR** acetaminophen, alum & mag hydroxide-simeth, ketorolac, metoCLOPramide (REGLAN) injection, morphine injection, ondansetron **OR** ondansetron (ZOFRAN) IV   ASSESMENT:   *  Pneumatosis.  TI and cecal inflammation. Left PV thrombosis.  On Heparin gtt. Ongoing abd pain, though seems better.  No fever, no leukocytosis, no abx  on board.    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  Started on Eliquis 05/2014. No hypercoaglulable lab work up then or now.  Pt held Eliquis PTA for 3 days during menses (as has been advised and doing since starting the med). No oral or injectable contraceptives at home.   *  Chronic pain and narcotics dependent (Methadone)  *  Chronic constipation, moving bowels on Linzess (new this admit), BID MOM.     PLAN   *  Supportive care.  D/w Dr Sloan Leiter who will initiate hypercoagulable workup.  Will drop MOM to once daily.  *  Will allow full liquids.    Azucena Freed  10/12/2014, 8:40 AM Pager: [phone]   ________________________________________________________________________  [name] GI MD note:  I personally examined the patient, reviewed the data and agree with the assessment and plan described above.  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 That caused her stroke in the past, caused the PV thrombosis, and is most likely cause of the damage to her right colon (ischemic hit?).  She does not have signs of severe compromise, will follow.  Will need lifelong anticoagulation likely. Hematology input  still pending.  Overlying this is chronic pain, methadone use and resultant constipation. She will continue linzess, MOM daily.  OK to advance diet as she tolerates.   Owens Loffler, MD [facility] Pager [phone]

## 2014-10-13 ENCOUNTER — Telehealth: Payer: Self-pay | Admitting: Hematology and Oncology

## 2014-10-13 ENCOUNTER — Inpatient Hospital Stay (HOSPITAL_COMMUNITY): Payer: Medicaid Other

## 2014-10-13 DIAGNOSIS — R1084 Generalized abdominal pain: Secondary | ICD-10-CM

## 2014-10-13 DIAGNOSIS — R109 Unspecified abdominal pain: Secondary | ICD-10-CM | POA: Insufficient documentation

## 2014-10-13 LAB — PROTEIN S ACTIVITY: Protein S Activity: 47 % — ABNORMAL LOW (ref 60–145)

## 2014-10-13 LAB — CBC
HCT: 28 % — ABNORMAL LOW (ref 36.0–46.0)
Hemoglobin: 8.2 g/dL — ABNORMAL LOW (ref 12.0–15.0)
MCH: 18.9 pg — ABNORMAL LOW (ref 26.0–34.0)
MCHC: 29.3 g/dL — ABNORMAL LOW (ref 30.0–36.0)
MCV: 64.5 fL — ABNORMAL LOW (ref 78.0–100.0)
Platelets: 190 10*3/uL (ref 150–400)
RBC: 4.34 MIL/uL (ref 3.87–5.11)
RDW: 20.9 % — ABNORMAL HIGH (ref 11.5–15.5)
WBC: 5.7 10*3/uL (ref 4.0–10.5)

## 2014-10-13 LAB — PROTEIN C ACTIVITY: Protein C Activity: 81 % (ref 74–151)

## 2014-10-13 LAB — BASIC METABOLIC PANEL
Anion gap: 7 (ref 5–15)
BUN: <5 mg/dL — ABNORMAL LOW (ref 6–20)
CO2: 28 mmol/L (ref 22–32)
Calcium: 8.5 mg/dL — ABNORMAL LOW (ref 8.9–10.3)
Chloride: 102 mmol/L (ref 101–111)
Creatinine, Ser: 0.76 mg/dL (ref 0.44–1.00)
GFR calc Af Amer: >60 mL/min (ref >60)
GFR calc non Af Amer: >60 mL/min (ref >60)
Glucose, Bld: 100 mg/dL — ABNORMAL HIGH (ref 65–99)
Potassium: 3.4 mmol/L — ABNORMAL LOW (ref 3.5–5.1)
Sodium: 137 mmol/L (ref 135–145)

## 2014-10-13 LAB — PROTEIN S, TOTAL: Protein S Ag, Total: 97 % (ref 58–150)

## 2014-10-13 LAB — LUPUS ANTICOAGULANT PANEL
DRVVT: 32.6 s (ref 0.0–55.1)
PTT Lupus Anticoagulant: 34.2 s (ref 0.0–50.0)

## 2014-10-13 LAB — HEPARIN LEVEL (UNFRACTIONATED): Heparin Unfractionated: 0.69 IU/mL (ref 0.30–0.70)

## 2014-10-13 LAB — HOMOCYSTEINE: Homocysteine: 4.2 umol/L (ref 0.0–15.0)

## 2014-10-13 LAB — PROTEIN C, TOTAL: Protein C, Total: 74 % (ref 70–140)

## 2014-10-13 LAB — LACTIC ACID, PLASMA: Lactic Acid, Venous: 1 mmol/L (ref 0.5–2.0)

## 2014-10-13 MED ORDER — SODIUM CHLORIDE 0.9 % IV SOLN
INTRAVENOUS | Status: DC
  Administered 2014-10-13 – 2014-10-16 (×6): via INTRAVENOUS

## 2014-10-13 MED ORDER — LEVETIRACETAM 500 MG PO TABS
500.0000 mg | ORAL_TABLET | Freq: Two times a day (BID) | ORAL | Status: DC
  Administered 2014-10-13 – 2014-10-16 (×7): 500 mg via ORAL
  Filled 2014-10-13 (×7): qty 1

## 2014-10-13 MED ORDER — ONDANSETRON HCL 4 MG PO TABS
4.0000 mg | ORAL_TABLET | Freq: Two times a day (BID) | ORAL | Status: DC
  Administered 2014-10-13 – 2014-10-16 (×7): 4 mg via ORAL
  Filled 2014-10-13 (×6): qty 1

## 2014-10-13 NOTE — Progress Notes (Signed)
Patient ID: Victoria Alvarez, female   DOB: 31-Jan-1991, 24 y.o.   MRN: [id number]     [facility]      [address] [place], [place] [address]    Phone: [phone] FAX: [phone]     Subjective: Since admission, the patient was improving until earlier today.  She had hardboiled eggs and developed worsening abdominal pain associated with nausea. She hurts all over.  It is severe.  Denies vomiting.  She is passing flatus.  Has been on multiple laxatives for constipation.  Initial CT on 7/30 showed pneumatosis.  Repeat CT on 8/2 showed occlusive left portal vein thrombosis, but main portal, SMV and splenic veins are patent.  Showed wall thickening of the TI and cecum.  We have been asked to evaluate for worsening symptoms.  Objective:  Vital signs:  Filed Vitals:   10/12/14 1642 10/12/14 2137 10/13/14 0624 10/13/14 1014  BP: 114/61 129/76 126/57 128/56  Pulse: 64 59 56 65  Temp: 98.9 F (37.2 C) 98.8 F (37.1 C) 99 F (37.2 C) 98.7 F (37.1 C)  TempSrc: Oral Oral Oral Oral  Resp: 18 16 18 16   Height:      Weight:      SpO2: 100% 100% 100% 100%    Last BM Date: 10/13/14  Intake/Output   Yesterday:  08/03 0701 - 08/04 0700 In: 0383.3 [P.O.:236; I.V.:1221.2; IV Piggyback:210] Out: -  This shift:  Total I/O In: 360 [P.O.:360] Out: -    Physical Exam: General: Pt awake/alert/oriented x4 in mild acute distress Chest: cta. No chest wall pain w good excursion CV:  Pulses intact.  Regular rhythm Abdomen: Soft.  Nondistended.  Diffusely tender with voluntary guarding. Ext:  SCDs BLE.  No mjr edema.  No cyanosis Skin: No petechiae / purpura   Problem List:   Principal Problem:   Pneumatosis of intestines Active Problems:   Current smoker   Anemia, iron deficiency   Seizure   Methadone maintenance therapy patient   Portal vein thrombosis   Abdominal pain, generalized    Results:   Labs: Results for orders placed  or performed during the hospital encounter of 10/07/14 (from the past 48 hour(s))  Protime-INR     Status: Abnormal   Collection Time: 10/11/14  3:43 PM  Result Value Ref Range   Prothrombin Time 18.5 (H) 11.6 - 15.2 seconds   INR 1.54 (H) 0.00 - 1.49  CBC     Status: Abnormal   Collection Time: 10/12/14  6:15 AM  Result Value Ref Range   WBC 5.8 4.0 - 10.5 K/uL   RBC 4.22 3.87 - 5.11 MIL/uL   Hemoglobin 8.2 (L) 12.0 - 15.0 g/dL   HCT 27.8 (L) 36.0 - 46.0 %   MCV 65.9 (L) 78.0 - 100.0 fL   MCH 19.4 (L) 26.0 - 34.0 pg   MCHC 29.5 (L) 30.0 - 36.0 g/dL   RDW 20.8 (H) 11.5 - 15.5 %   Platelets 185 150 - 400 K/uL  Comprehensive metabolic panel     Status: Abnormal   Collection Time: 10/12/14  6:15 AM  Result Value Ref Range   Sodium 138 135 - 145 mmol/L   Potassium 3.9 3.5 - 5.1 mmol/L   Chloride 109 101 - 111 mmol/L   CO2 24 22 - 32 mmol/L   Glucose, Bld 89 65 - 99 mg/dL   BUN <5 (L) 6 - 20 mg/dL   Creatinine, Ser 0.69 0.44 - 1.00  mg/dL   Calcium 7.6 (L) 8.9 - 10.3 mg/dL   Total Protein 4.8 (L) 6.5 - 8.1 g/dL   Albumin 2.8 (L) 3.5 - 5.0 g/dL   AST 16 15 - 41 U/L   ALT 26 14 - 54 U/L   Alkaline Phosphatase 41 38 - 126 U/L   Total Bilirubin 0.4 0.3 - 1.2 mg/dL   GFR calc non Af Amer >60 >60 mL/min   GFR calc Af Amer >60 >60 mL/min    Comment: (NOTE) The eGFR has been calculated using the CKD EPI equation. This calculation has not been validated in all clinical situations. eGFR's persistently <60 mL/min signify possible Chronic Kidney Disease.    Anion gap 5 5 - 15  Heparin level (unfractionated)     Status: Abnormal   Collection Time: 10/12/14  6:15 AM  Result Value Ref Range   Heparin Unfractionated 0.99 (H) 0.30 - 0.70 IU/mL    Comment:        IF HEPARIN RESULTS ARE BELOW EXPECTED VALUES, AND PATIENT DOSAGE HAS BEEN CONFIRMED, SUGGEST FOLLOW UP TESTING OF ANTITHROMBIN III LEVELS.   APTT     Status: Abnormal   Collection Time: 10/12/14  6:15 AM  Result Value Ref  Range   aPTT 62 (H) 24 - 37 seconds    Comment:        IF BASELINE aPTT IS ELEVATED, SUGGEST PATIENT RISK ASSESSMENT BE USED TO DETERMINE APPROPRIATE ANTICOAGULANT THERAPY.   Antithrombin III     Status: None   Collection Time: 10/12/14 12:00 PM  Result Value Ref Range   AntiThromb III Func 104 75 - 120 %  Protein C, total     Status: None   Collection Time: 10/12/14 12:00 PM  Result Value Ref Range   Protein C, Total 74 70 - 140 %    Comment: (NOTE) Performed At: [facility] [place], [place] [id number] Lindon Romp MD GY:[id number]   Heparin level (unfractionated)     Status: Abnormal   Collection Time: 10/12/14  2:58 PM  Result Value Ref Range   Heparin Unfractionated 0.89 (H) 0.30 - 0.70 IU/mL    Comment:        IF HEPARIN RESULTS ARE BELOW EXPECTED VALUES, AND PATIENT DOSAGE HAS BEEN CONFIRMED, SUGGEST FOLLOW UP TESTING OF ANTITHROMBIN III LEVELS.   APTT     Status: Abnormal   Collection Time: 10/12/14  2:58 PM  Result Value Ref Range   aPTT 75 (H) 24 - 37 seconds    Comment:        IF BASELINE aPTT IS ELEVATED, SUGGEST PATIENT RISK ASSESSMENT BE USED TO DETERMINE APPROPRIATE ANTICOAGULANT THERAPY.   APTT     Status: Abnormal   Collection Time: 10/12/14  9:10 PM  Result Value Ref Range   aPTT 79 (H) 24 - 37 seconds    Comment:        IF BASELINE aPTT IS ELEVATED, SUGGEST PATIENT RISK ASSESSMENT BE USED TO DETERMINE APPROPRIATE ANTICOAGULANT THERAPY.   Heparin level (unfractionated)     Status: Abnormal   Collection Time: 10/12/14  9:10 PM  Result Value Ref Range   Heparin Unfractionated 0.72 (H) 0.30 - 0.70 IU/mL    Comment:        IF HEPARIN RESULTS ARE BELOW EXPECTED VALUES, AND PATIENT DOSAGE HAS BEEN CONFIRMED, SUGGEST FOLLOW UP TESTING OF ANTITHROMBIN III LEVELS.   CBC     Status: Abnormal   Collection Time: 10/13/14  5:55 AM  Result Value Ref Range   WBC 5.7 4.0 - 10.5 K/uL   RBC 4.34 3.87 - 5.11 MIL/uL    Hemoglobin 8.2 (L) 12.0 - 15.0 g/dL   HCT 28.0 (L) 36.0 - 46.0 %   MCV 64.5 (L) 78.0 - 100.0 fL   MCH 18.9 (L) 26.0 - 34.0 pg   MCHC 29.3 (L) 30.0 - 36.0 g/dL   RDW 20.9 (H) 11.5 - 15.5 %   Platelets 190 150 - 400 K/uL  Heparin level (unfractionated)     Status: None   Collection Time: 10/13/14  5:55 AM  Result Value Ref Range   Heparin Unfractionated 0.69 0.30 - 0.70 IU/mL    Comment:        IF HEPARIN RESULTS ARE BELOW EXPECTED VALUES, AND PATIENT DOSAGE HAS BEEN CONFIRMED, SUGGEST FOLLOW UP TESTING OF ANTITHROMBIN III LEVELS.     Imaging / Studies: Ct Abdomen Pelvis W Contrast  10/11/2014   CLINICAL DATA:  Abdominal pain. History of hernia repair, pyelonephritis, seizures, CVA and substance abuse. Patient discontinued Eliquis therapy prior to admission. Initial encounter.  EXAM: CT ABDOMEN AND PELVIS WITH CONTRAST  TECHNIQUE: Multidetector CT imaging of the abdomen and pelvis was performed using the standard protocol following bolus administration of intravenous contrast.  CONTRAST:  100 ml Omnipaque 300.  COMPARISON:  Abdominal CT 10/08/2014 and 07/17/2013  FINDINGS: Lower chest: Increased volume loss, ground-glass opacity and linear densities within the right middle and lower lobes may reflect atelectasis or infection. Small right pleural effusion.  Hepatobiliary: There is left portal vein thrombosis with scattered low-density throughout the left hepatic lobe. No suspicious hepatic lesions demonstrated. The right and main portal veins are patent. No evidence of gallstones, gallbladder wall thickening or biliary dilatation.  Pancreas: Unremarkable. No pancreatic ductal dilatation or surrounding inflammatory changes.  Spleen: Normal in size without focal abnormality.  Adrenals/Urinary Tract: Both adrenal glands appear normal.The kidneys appear normal without evidence of urinary tract calculus, suspicious lesion or hydronephrosis. No bladder abnormalities are seen.  Stomach/Bowel: The stomach  and proximal small bowel appear normal. There is distal small bowel wall thickening with mild surrounding inflammatory change. There is also mild wall thickening of the cecum. No residual pneumatosis identified.The appendix appears normal. There is small amount of free fluid in the right upper pelvis.  Vascular/Lymphatic: There are no enlarged abdominal or pelvic lymph nodes. As above, there is left portal vein thrombosis. The right and main portal veins are patent. The superior mesenteric and splenic veins are patent. No significant arterial findings.  Reproductive: Unremarkable.  Other: Postsurgical changes in the left groin without evidence of recurrent hernia.  Musculoskeletal: No acute or significant osseous findings.  IMPRESSION: 1. Occlusive left portal vein thrombosis, likely secondary to discontinued anti coagulation. The main portal, superior mesenteric and splenic veins are patent. 2. Mild wall thickening of the terminal ileum and cecum with surrounding inflammation, possibly secondary to infectious enteritis, vasculitis or venous thrombosis. No evidence of arterial occlusion, perforation or abscess. 3. Increased predominate linear opacity at the right lung base, likely atelectasis. 4. These results were called by telephone at the time of interpretation on 10/11/2014 at 2:00 pm to Dr. Lala Lund , who verbally acknowledged these results.   Electronically Signed   By: Richardean Sale M.D.   On: 10/11/2014 14:04    Medications / Allergies:  Scheduled Meds: . ferumoxytol  510 mg Intravenous Once  . FLUoxetine  20 mg Oral Daily  . levETIRAcetam  500 mg Oral BID  .  Linaclotide  145 mcg Oral Daily  . magnesium hydroxide  30 mL Oral Daily  . methadone  85 mg Oral Daily  . methylnaltrexone  12 mg Subcutaneous QODAY  . nicotine  21 mg Transdermal Daily  . ondansetron  4 mg Oral Q12H   Continuous Infusions: . heparin 1,250 Units/hr (10/12/14 1912)   PRN Meds:.acetaminophen **OR** acetaminophen,  alum & mag hydroxide-simeth, metoCLOPramide (REGLAN) injection, morphine injection, ondansetron **OR** ondansetron (ZOFRAN) IV  Antibiotics: Anti-infectives    Start     Dose/Rate Route Frequency Ordered Stop   10/08/14 0645  piperacillin-tazobactam (ZOSYN) IVPB 3.375 g     3.375 g 100 mL/hr over 30 Minutes Intravenous  Once 10/08/14 0630 10/08/14 0737        Assessment/Plan Abdominal pain Left portal vein thrombosis TI and cecum thickening/inflammation  Hemodynamically stable.  Bowel sounds are present and abdomen is soft.  Pain seems out of proportion.  Will proceed with AXR to rule out pneumoperitoneum.  Would keep her NPO for now. She is on a heparin drip. No indications for urgent surgery.  Will await films.  Thank you for the re-consult.  We will follow along.      Erby Pian, [facility] Pager [phone]) For consults and floor pages call [phone])  10/13/2014 12:56 PM

## 2014-10-13 NOTE — Progress Notes (Signed)
[facility] contacted RN and asked about blood cultures that were drawn in ED and stated cultures were never sent. Dr. Jerral Ralph made aware and order to draw blood cultures.

## 2014-10-13 NOTE — Progress Notes (Signed)
[facility] Progress Note    Since last GI note: Ate small amount of [employer] sub last night, picking at her eggs and biscuits this morning.  Still with abd pain, nausea.  BM yesterday +  Objective: Vital signs in last 24 hours: Temp:  [98.8 F (37.1 C)-99 F (37.2 C)] 99 F (37.2 C) (08/04 0624) Pulse Rate:  [56-64] 56 (08/04 0624) Resp:  [16-18] 18 (08/04 0624) BP: (114-129)/(57-76) 126/57 mmHg (08/04 0624) SpO2:  [100 %] 100 % (08/04 0624) Last BM Date: 10/12/14 General: alert and oriented times 3 Heart: regular rate and rythm Abdomen: soft, mildly tender throughout, non-distended, normal bowel sounds   Lab Results:  Recent Labs  10/11/14 0555 10/12/14 0615 10/13/14 0555  WBC 8.0 5.8 5.7  HGB 10.0* 8.2* 8.2*  PLT 212 185 190  MCV 66.2* 65.9* 64.5*    Recent Labs  10/12/14 0615  NA 138  K 3.9  CL 109  CO2 24  GLUCOSE 89  BUN <5*  CREATININE 0.69  CALCIUM 7.6*    Recent Labs  10/12/14 0615  PROT 4.8*  ALBUMIN 2.8*  AST 16  ALT 26  ALKPHOS 41  BILITOT 0.4    Recent Labs  10/11/14 1543  INR 1.54*    Medications: Scheduled Meds: . ferumoxytol  510 mg Intravenous Once  . FLUoxetine  20 mg Oral Daily  . levETIRAcetam  500 mg Intravenous Q12H  . Linaclotide  145 mcg Oral Daily  . magnesium hydroxide  30 mL Oral Daily  . methadone  85 mg Oral Daily  . methylnaltrexone  12 mg Subcutaneous QODAY  . nicotine  21 mg Transdermal Daily   Continuous Infusions: . heparin 1,250 Units/hr (10/12/14 1912)   PRN Meds:.acetaminophen **OR** acetaminophen, alum & mag hydroxide-simeth, metoCLOPramide (REGLAN) injection, morphine injection, ondansetron **OR** ondansetron (ZOFRAN) IV    Assessment/Plan: 24 y.o. female with blood clotting disorder, currently with acute PV thombosis, right sided colon injury (likely relate to PV thrombosis or other more local vascular clot).  Continue to encourage her to eat, ambulate. Blood thinning per  hematology but she has questions about what to do around time of menstrual periods. Currently has vaginal bleeding.  She has minor blood in stool, improving.  Likely from ischemic damage related to right colon injury.  No plans for invasive, endoscopic testing.  I will add scheduled antiemetics to help facilitate her appetite, po intake.    Milus Banister, MD  10/13/2014, 9:42 AM [facility] Pager [phone]

## 2014-10-13 NOTE — Progress Notes (Signed)
PATIENT DETAILS Name: Victoria Alvarez Age: 24 y.o. Sex: female Date of Birth: 03/18/1990 Admit Date: 10/07/2014 Admitting Physician Theodis Blaze, MD [id number], Lennox Laity, MD  Subjective: Worsening abd pain-hematochezia this am.Very uncomfortable.  Assessment/Plan: Principal Problem: Abd pain: Initially felt to be from constipation and pneumatosis intestinalis, however since patient continued to have persistent pain inspite of treatment of constipation, repeat CT done on 8/2 now shows left portal venous thrombosis. On IV heparin-unfortunately continues to have worsening pain-have asked [facility] to evaluate.Just had a a CT abd 2 days back-may need to repeat one again given worsening pain-will wait to see what [facility] suggests Will keep NPO  Left portal venous sinus thrombosis: Continue IV heparin. Hypercoagulable panel ordered. Appreciate hematology evaluation. Appreciate vascular surgery evaluation. Per gastroenterology, she would likely need lifelong anticoagulation-given prior hx of cerebral sinus thrombosis.  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 Since she has now developed portal venous sinus thrombosis in spite of Eliquis treatment-suspect she may not have been very compliant-does claim that she has been told to hold Eliquis during her menses. Suspect underlying hypercoagulable state-await hypercoagulable w/u  Chronic Constipation:continue Linzess  Anemia: No evidence of overt blood loss, suspect secondary to acute illness. Follow  Chronic pain/history of interstitial cystitis: Continue with methadone.  Disposition: Remain inpatient  Antimicrobial agents  See below  Anti-infectives    Start     Dose/Rate Route Frequency Ordered Stop   10/08/14 0645  piperacillin-tazobactam (ZOSYN) IVPB 3.375 g     3.375 g 100 mL/hr  over 30 Minutes Intravenous  Once 10/08/14 0630 10/08/14 0737      DVT Prophylaxis: IV Heparin gtt  Code Status: Full code  Family Communication None at bedside  Procedures: None  CONSULTS:  GI, hematology/oncology, general surgery and vascular surgery  Time spent 30 minutes-Greater than 50% of this time was spent in counseling, explanation of diagnosis, planning of further management, and coordination of care.  MEDICATIONS: Scheduled Meds: . ferumoxytol  510 mg Intravenous Once  . FLUoxetine  20 mg Oral Daily  . levETIRAcetam  500 mg Oral BID  . Linaclotide  145 mcg Oral Daily  . magnesium hydroxide  30 mL Oral Daily  . methadone  85 mg Oral Daily  . methylnaltrexone  12 mg Subcutaneous QODAY  . nicotine  21 mg Transdermal Daily  . ondansetron  4 mg Oral Q12H   Continuous Infusions: . heparin 1,250 Units/hr (10/12/14 1912)   PRN Meds:.acetaminophen **OR** acetaminophen, alum & mag hydroxide-simeth, metoCLOPramide (REGLAN) injection, morphine injection, ondansetron **OR** ondansetron (ZOFRAN) IV    PHYSICAL EXAM: Vital signs in last 24 hours: Filed Vitals:   10/12/14 1642 10/12/14 2137 10/13/14 0624 10/13/14 1014  BP: 114/61 129/76 126/57 128/56  Pulse: 64 59 56 65  Temp: 98.9 F (37.2 C) 98.8 F (37.1 C) 99 F (37.2 C) 98.7 F (37.1 C)  TempSrc: Oral Oral Oral Oral  Resp: 18 16 18 16   Height:      Weight:      SpO2: 100% 100% 100% 100%    Weight change:  Filed Weights   10/07/14 2046  Weight: 77.111 kg (170 lb)   Body mass index is 31.09 kg/(m^2).   Gen Exam: Awake and alert with clear speech. Neck: Supple, No JVD.   Chest: B/L Clear.  CVS: S1 S2 Regular, no murmurs.  Abdomen: soft, BS +, diffusely tender-mostly on her right side Extremities: no edema, lower extremities warm to touch. Neurologic: Non Focal.   Skin: No Rash.   Wounds: N/A.   Intake/Output from previous day:  Intake/Output Summary (Last 24 hours) at 10/13/14 1214 Last  data filed at 10/13/14 0900  Gross per 24 hour  Intake 2027.2 ml  Output      0 ml  Net 2027.2 ml     LAB RESULTS: CBC  Recent Labs Lab 10/06/14 2136 10/07/14 2125 10/09/14 0633 10/09/14 1805 10/10/14 0600 10/11/14 0555 10/12/14 0615 10/13/14 0555  WBC 5.7 7.3 10.9*  --  9.5 8.0 5.8 5.7  HGB 9.2* 10.2* 9.0* 9.0* 9.3* 10.0* 8.2* 8.2*  HCT 31.0* 33.7* 30.5* 31.2* 32.4* 34.4* 27.8* 28.0*  PLT 226 215 201  --  231 212 185 190  MCV 64.6* 64.6* 64.8*  --  65.2* 66.2* 65.9* 64.5*  MCH 19.2* 19.5* 19.1*  --  18.7* 19.2* 19.4* 18.9*  MCHC 29.7* 30.3 29.5*  --  28.7* 29.1* 29.5* 29.3*  RDW 20.4* 20.6* 20.6*  --  20.7* 20.5* 20.8* 20.9*  LYMPHSABS 2.6 1.0  --   --   --   --   --   --   MONOABS 0.4 0.3  --   --   --   --   --   --   EOSABS 0.1 0.0  --   --   --   --   --   --   BASOSABS 0.1 0.0  --   --   --   --   --   --     Chemistries   Recent Labs Lab 10/06/14 1751 10/07/14 2125 10/09/14 0633 10/10/14 0600 10/12/14 0615  NA 138 139 137 136 138  K 4.2 3.6 3.3* 3.8 3.9  CL 105 106 106 109 109  CO2 22 22 24 22 24   GLUCOSE 88 109* 113* 106* 89  BUN 14 10 9  <5* <5*  CREATININE 0.69 0.80 0.87 0.64 0.69  CALCIUM 9.6 9.5 7.9* 8.2* 7.6*  MG  --   --   --  1.9  --     CBG: No results for input(s): GLUCAP in the last 168 hours.  GFR Estimated Creatinine Clearance: 103.4 mL/min (by C-G formula based on Cr of 0.69).  Coagulation profile  Recent Labs Lab 10/11/14 1543  INR 1.54*    Cardiac Enzymes No results for input(s): CKMB, TROPONINI, MYOGLOBIN in the last 168 hours.  Invalid input(s): CK  Invalid input(s): POCBNP No results for input(s): DDIMER in the last 72 hours. No results for input(s): HGBA1C in the last 72 hours. No results for input(s): CHOL, HDL, LDLCALC, TRIG, CHOLHDL, LDLDIRECT in the last 72 hours. No results for input(s): TSH, T4TOTAL, T3FREE, THYROIDAB in the last 72 hours.  Invalid input(s): FREET3 No results for input(s): VITAMINB12,  FOLATE, FERRITIN, TIBC, IRON, RETICCTPCT in the last 72 hours. No results for input(s): LIPASE, AMYLASE in the last 72 hours.  Urine Studies No results for input(s): UHGB, CRYS in the last 72 hours.  Invalid input(s): UACOL, UAPR, USPG, UPH, UTP, UGL, UKET, UBIL, UNIT, UROB, ULEU, UEPI, UWBC, URBC, UBAC, CAST, UCOM, BILUA  MICROBIOLOGY: No results found for this or any previous visit (from the past 240 hour(s)).  RADIOLOGY STUDIES/RESULTS: Ct Abdomen Pelvis Wo Contrast  10/08/2014   CLINICAL DATA:  Generalized abdominal pain, greatest in the left lower quadrant.  EXAM:  CT ABDOMEN AND PELVIS WITHOUT CONTRAST  TECHNIQUE: Multidetector CT imaging of the abdomen and pelvis was performed following the standard protocol without IV contrast.  COMPARISON:  07/17/2013  FINDINGS: There is pneumatosis of the cecum and ascending colon with a generous volume of air in the wall. There also is a little air which appears to extend toward the undersurface of the gallbladder and liver, perhaps in the hepatoduodenal ligament. There is a small bubble of air in the porta hepatis which may reside within the portal vein. No frank free intraperitoneal air is evident. Despite the rather generous volume of pneumatosis, there is no significant inflammatory change about the cecum or ascending colon. There is no bowel obstruction. Appendix is normal.  There are unremarkable unenhanced appearances of the liver, spleen, pancreas, adrenals and kidneys. Uterus and ovaries appear normal. There is no significant abnormality in the lower chest. There is no significant musculoskeletal abnormality.  IMPRESSION: Pneumatosis of the cecum and ascending colon. There are a few bubbles of air extending up toward the under surface of the gallbladder but the air does not appear to be free in the peritoneum. There also may be a bubble of air in the portal vein. Despite the generous volume of extraluminal air, there is little or no inflammatory  change. There is no obstruction. There is no ascites. These results were called by telephone at the time of interpretation on 10/08/2014 at 2:51 am to Dr. Dina Rich, who verbally acknowledged these results.   Electronically Signed   By: Andreas Newport M.D.   On: 10/08/2014 02:57   Dg Chest 2 View  10/06/2014   CLINICAL DATA:  Abdominal pain and shortness of breath for 5 months.  EXAM: CHEST  2 VIEW  COMPARISON:  Chest radiograph 06/12/2014  FINDINGS: The heart is normal in size. Mild diffuse bronchial thickening. Pulmonary vasculature is normal. No consolidation, pleural effusion, or pneumothorax. No acute osseous abnormalities are seen.  IMPRESSION: Mild diffuse bronchial thickening, can be seen with asthma or bronchitis.   Electronically Signed   By: Jeb Levering M.D.   On: 10/06/2014 21:29   Ct Abdomen Pelvis W Contrast  10/11/2014   CLINICAL DATA:  Abdominal pain. History of hernia repair, pyelonephritis, seizures, CVA and substance abuse. Patient discontinued Eliquis therapy prior to admission. Initial encounter.  EXAM: CT ABDOMEN AND PELVIS WITH CONTRAST  TECHNIQUE: Multidetector CT imaging of the abdomen and pelvis was performed using the standard protocol following bolus administration of intravenous contrast.  CONTRAST:  100 ml Omnipaque 300.  COMPARISON:  Abdominal CT 10/08/2014 and 07/17/2013  FINDINGS: Lower chest: Increased volume loss, ground-glass opacity and linear densities within the right middle and lower lobes may reflect atelectasis or infection. Small right pleural effusion.  Hepatobiliary: There is left portal vein thrombosis with scattered low-density throughout the left hepatic lobe. No suspicious hepatic lesions demonstrated. The right and main portal veins are patent. No evidence of gallstones, gallbladder wall thickening or biliary dilatation.  Pancreas: Unremarkable. No pancreatic ductal dilatation or surrounding inflammatory changes.  Spleen: Normal in size without focal  abnormality.  Adrenals/Urinary Tract: Both adrenal glands appear normal.The kidneys appear normal without evidence of urinary tract calculus, suspicious lesion or hydronephrosis. No bladder abnormalities are seen.  Stomach/Bowel: The stomach and proximal small bowel appear normal. There is distal small bowel wall thickening with mild surrounding inflammatory change. There is also mild wall thickening of the cecum. No residual pneumatosis identified.The appendix appears normal. There is small amount of free fluid in  the right upper pelvis.  Vascular/Lymphatic: There are no enlarged abdominal or pelvic lymph nodes. As above, there is left portal vein thrombosis. The right and main portal veins are patent. The superior mesenteric and splenic veins are patent. No significant arterial findings.  Reproductive: Unremarkable.  Other: Postsurgical changes in the left groin without evidence of recurrent hernia.  Musculoskeletal: No acute or significant osseous findings.  IMPRESSION: 1. Occlusive left portal vein thrombosis, likely secondary to discontinued anti coagulation. The main portal, superior mesenteric and splenic veins are patent. 2. Mild wall thickening of the terminal ileum and cecum with surrounding inflammation, possibly secondary to infectious enteritis, vasculitis or venous thrombosis. No evidence of arterial occlusion, perforation or abscess. 3. Increased predominate linear opacity at the right lung base, likely atelectasis. 4. These results were called by telephone at the time of interpretation on 10/11/2014 at 2:00 pm to Dr. Lala Lund , who verbally acknowledged these results.   Electronically Signed   By: Richardean Sale M.D.   On: 10/11/2014 14:04   Dg Abd 2 Views  10/09/2014   CLINICAL DATA:  Generalized abdominal pain. Nausea vomiting diarrhea for 2 days  EXAM: ABDOMEN - 2 VIEW  COMPARISON:  CT 10/06/2014, radiograph 10/06/2014  FINDINGS: The oral contrast from CT of 10/06/2014 has progressed into  the rectum. There is mild decrease in the volume of stool seen on comparison exam. No dilated loops of large or small bowel. NO intraoperative free air.  IMPRESSION: No evidence of bowel obstruction. Progression of oral contrast the rectum from CT of 10/06/2014.   Electronically Signed   By: Suzy Bouchard M.D.   On: 10/09/2014 09:33   Dg Abd 2 Views  10/06/2014   CLINICAL DATA:  Abdominal pain and diarrhea for 5 months.  EXAM: ABDOMEN - 2 VIEW  COMPARISON:  07/13/2014  FINDINGS: There is no free intra-abdominal air. No dilated bowel loops to suggest obstruction. Moderate volume of stool throughout the colon. No radiopaque calculi. Pelvic phleboliths and surgical clips projecting over the left inguinal region, unchanged. There is chronic widening of the pubic symphysis. No acute osseous abnormalities are seen.  IMPRESSION: Normal abdominal radiographs with moderate volume of colonic stool.   Electronically Signed   By: Jeb Levering M.D.   On: 10/06/2014 21:32   Dg Abd Portable 1v  10/11/2014   CLINICAL DATA:  Generalized abdominal pain, nausea, vomiting and constipation, over the past 4 days. Initial encounter.  EXAM: PORTABLE ABDOMEN - 1 VIEW  COMPARISON:  Abdominal radiograph performed 10/09/2014  FINDINGS: The colon contains a small amount of air but is otherwise empty. No bowel dilatation is seen to suggest obstruction. No free intra-abdominal air is seen, though evaluation for free air is limited on a single supine view.  No acute osseous abnormalities are identified.  IMPRESSION: Colon contains a small amount of air but is otherwise empty. No evidence for bowel obstruction. No radiographic evidence to suggest constipation.   Electronically Signed   By: Garald Balding M.D.   On: 10/11/2014 00:36    Oren Binet, MD  [facility] Pager:[phone]  If 7PM-7AM, please contact night-coverage [email or web address] Password [email or web address] 10/13/2014, 12:14 PM   LOS: 5 days

## 2014-10-13 NOTE — Progress Notes (Signed)
ANTICOAGULATION CONSULT NOTE - Follow-Up Consult  Pharmacy Consult for heparin  Indication: portal vein thrombosis  Allergies  Allergen Reactions  . Ativan [Lorazepam] Other (See Comments)    " I don't remember anything."  . Bee Venom Swelling  . Ciprofloxacin Diarrhea and Nausea And Vomiting   Labs:  Recent Labs  10/11/14 0555 10/11/14 1543  10/12/14 0615 10/12/14 1458 10/12/14 2110 10/13/14 0555  HGB 10.0*  --   --  8.2*  --   --  8.2*  HCT 34.4*  --   --  27.8*  --   --  28.0*  PLT 212  --   --  185  --   --  190  APTT  --   --   --  62* 75* 79*  --   LABPROT  --  18.5*  --   --   --   --   --   INR  --  1.54*  --   --   --   --   --   HEPARINUNFRC  --   --   < > 0.99* 0.89* 0.72* 0.69  CREATININE  --   --   --  0.69  --   --   --   < > = values in this interval not displayed.  Estimated Creatinine Clearance: 103.4 mL/min (by C-G formula based on Cr of 0.69).  Assessment: 24yo female with hx of stroke, sz, on Eliquis PTA. She stopped taking Eliquis a few days prior to admission. She was admitted with abdominal pain, CT abdomen showed a thrombosis of L portal vein. Suspect possible poor adherence w/ anticoagulation, hypercoagulable panel pending.  Heparin level therapeutic  Goal of Therapy:  Heparin level 0.3-0.7 units/ml aPTT 66-102 seconds Monitor platelets by anticoagulation protocol: Yes   Plan:  -Continue heparin at 1250 units/hr -Monitor daily HL, CBC, s/sx of bleeding -F/u hypercoag panel, plan for PO anticoag  Thank you Okey Regal, PharmD [phone]  10/13/2014 11:30 AM ______________________________________________________

## 2014-10-13 NOTE — Telephone Encounter (Signed)
Patient scheduled for 08/23 @ 9:30 w/Dr. Alvy Bimler.

## 2014-10-13 NOTE — Progress Notes (Signed)
Dr. Jerral Ralph made aware of patient's poor vascular access and need for second IV line. Order received to place PICC line

## 2014-10-14 ENCOUNTER — Inpatient Hospital Stay (HOSPITAL_COMMUNITY): Payer: Medicaid Other

## 2014-10-14 DIAGNOSIS — F1120 Opioid dependence, uncomplicated: Secondary | ICD-10-CM

## 2014-10-14 LAB — BASIC METABOLIC PANEL
Anion gap: 9 (ref 5–15)
BUN: <5 mg/dL — ABNORMAL LOW (ref 6–20)
CO2: 25 mmol/L (ref 22–32)
Calcium: 8.5 mg/dL — ABNORMAL LOW (ref 8.9–10.3)
Chloride: 103 mmol/L (ref 101–111)
Creatinine, Ser: 0.7 mg/dL (ref 0.44–1.00)
GFR calc Af Amer: >60 mL/min (ref >60)
GFR calc non Af Amer: >60 mL/min (ref >60)
Glucose, Bld: 97 mg/dL (ref 65–99)
Potassium: 3.8 mmol/L (ref 3.5–5.1)
Sodium: 137 mmol/L (ref 135–145)

## 2014-10-14 LAB — HEPARIN LEVEL (UNFRACTIONATED): Heparin Unfractionated: 0.4 IU/mL (ref 0.30–0.70)

## 2014-10-14 LAB — CBC
HCT: 30.3 % — ABNORMAL LOW (ref 36.0–46.0)
Hemoglobin: 8.8 g/dL — ABNORMAL LOW (ref 12.0–15.0)
MCH: 18.6 pg — ABNORMAL LOW (ref 26.0–34.0)
MCHC: 29 g/dL — ABNORMAL LOW (ref 30.0–36.0)
MCV: 64.1 fL — ABNORMAL LOW (ref 78.0–100.0)
Platelets: 209 10*3/uL (ref 150–400)
RBC: 4.73 MIL/uL (ref 3.87–5.11)
RDW: 20.8 % — ABNORMAL HIGH (ref 11.5–15.5)
WBC: 6.2 10*3/uL (ref 4.0–10.5)

## 2014-10-14 LAB — BETA-2-GLYCOPROTEIN I ABS, IGG/M/A
Beta-2 Glyco I IgG: <9 GPI IgG units (ref 0–20)
Beta-2-Glycoprotein I IgA: <9 GPI IgA units (ref 0–25)
Beta-2-Glycoprotein I IgM: <9 GPI IgM units (ref 0–32)

## 2014-10-14 LAB — CARDIOLIPIN ANTIBODIES, IGG, IGM, IGA
Anticardiolipin IgA: <9 APL U/mL (ref 0–11)
Anticardiolipin IgG: <9 GPL U/mL (ref 0–14)
Anticardiolipin IgM: 16 MPL U/mL — ABNORMAL HIGH (ref 0–12)

## 2014-10-14 MED ORDER — POLYETHYLENE GLYCOL 3350 17 G PO PACK
17.0000 g | PACK | Freq: Every day | ORAL | Status: DC
  Administered 2014-10-14: 17 g via ORAL
  Filled 2014-10-14 (×3): qty 1

## 2014-10-14 NOTE — Progress Notes (Signed)
ANTICOAGULATION CONSULT NOTE - Follow-Up Consult  Pharmacy Consult for heparin  Indication: portal vein thrombosis  Allergies  Allergen Reactions  . Ativan [Lorazepam] Other (See Comments)    " I don't remember anything."  . Bee Venom Swelling  . Ciprofloxacin Diarrhea and Nausea And Vomiting   Labs:  Recent Labs  10/11/14 1543  10/12/14 0615 10/12/14 1458 10/12/14 2110 10/13/14 0555 10/13/14 1208 10/14/14 0525  HGB  --   < > 8.2*  --   --  8.2*  --  8.8*  HCT  --   --  27.8*  --   --  28.0*  --  30.3*  PLT  --   --  185  --   --  190  --  209  APTT  --   --  62* 75* 79*  --   --   --   LABPROT 18.5*  --   --   --   --   --   --   --   INR 1.54*  --   --   --   --   --   --   --   HEPARINUNFRC  --   < > 0.99* 0.89* 0.72* 0.69  --  0.40  CREATININE  --   --  0.69  --   --   --  0.76 0.70  < > = values in this interval not displayed.  Estimated Creatinine Clearance: 103.4 mL/min (by C-G formula based on Cr of 0.7).  Assessment: 24yo female with hx of stroke, sz, on Eliquis PTA. She stopped taking Eliquis a few days prior to admission. She was admitted with abdominal pain, CT abdomen showed a thrombosis of L portal vein. Suspect possible poor adherence w/ anticoagulation, hypercoagulable panel pending.  Heparin level therapeutic  Goal of Therapy:  Heparin level 0.3-0.7 units/ml aPTT 66-102 seconds Monitor platelets by anticoagulation protocol: Yes   Plan:  -Continue heparin at 1250 units/hr -Monitor daily HL, CBC, s/sx of bleeding -F/u hypercoag panel, plan for PO anticoag  Thank you Okey Regal, PharmD [phone]  10/14/2014 10:28 AM ______________________________________________________

## 2014-10-14 NOTE — Progress Notes (Signed)
Patient states noted stool is black, and loose. Noted patient received iron this admission 8/4.  Notified Ghimire, MD stated to continue to monitor patient. Patient told to call nurse when she has another stool so it can be assess. Patient verbalized understanding. Will continue to monitor.

## 2014-10-14 NOTE — Care Management Note (Signed)
Case Management Note  Patient Details  Name: Sheala Dosh MRN: [id number] Date of Birth: 10/07/1990  Subjective/Objective:    Patient with portal vein thrombosis, she conts to have abd pain, she is npo, for repeat ct in a couple of days,  She just recently had a ct.  NCM will cont to follow for dc needs.                Action/Plan:   Expected Discharge Date:                  Expected Discharge Plan:  Home/Self Care  In-House Referral:     Discharge planning Services  CM Consult  Post Acute Care Choice:    Choice offered to:     DME Arranged:    DME Agency:     HH Arranged:    HH Agency:     Status of Service:  In process, will continue to follow  Medicare Important Message Given:    Date Medicare IM Given:    Medicare IM give by:    Date Additional Medicare IM Given:    Additional Medicare Important Message give by:     If discussed at Long Length of Stay Meetings, dates discussed:    Additional Comments:  Leone Haven, RN 10/14/2014, 11:18 AM

## 2014-10-14 NOTE — Progress Notes (Signed)
[facility] Progress Note     Subjective: Pt still c/o pain, some nausea.  No vomiting.  Not much distension.  Having flatus and BM's.  Ambulating OOB in halls.  Says she wants to get back to her children.  Pain is no worse or now better, but seems out of proportion to exam.  Pain in left side of abdomen and suprapubic.  Objective: Vital signs in last 24 hours: Temp:  [98.5 F (36.9 C)-99.3 F (37.4 C)] 99.3 F (37.4 C) Nov 01, 2022 0444) Pulse Rate:  [58-62] 58 11/01/22 0444) Resp:  [15-18] 18 2022-11-01 0444) BP: (97-123)/(41-60) 123/60 mmHg 11/01/22 0444) SpO2:  [95 %-100 %] 95 % 2022/11/01 0444) Last BM Date: 10/13/14  Intake/Output from previous day: 08/04 0701 - 2022-11-01 0700 In: 733.3 [P.O.:360; I.V.:373.3] Out: 1800 [Urine:1800] Intake/Output this shift: Total I/O In: 1629.2 [I.V.:1629.2] Out: -   PE: Gen:  Alert, NAD, pleasant Card:  RRR, no M/G/R heard Pulm:  CTA, no W/R/R Abd: Soft, ND, tenderness in left side of abdomen, periumbilical, and suprapubic, +BS, no HSM, c-section scar well healed.   Lab Results:   Recent Labs  10/13/14 0555 Nov 01, 2014 0525  WBC 5.7 6.2  HGB 8.2* 8.8*  HCT 28.0* 30.3*  PLT 190 209   BMET  Recent Labs  10/13/14 1208 11-01-2014 0525  NA 137 137  K 3.4* 3.8  CL 102 103  CO2 28 25  GLUCOSE 100* 97  BUN <5* <5*  CREATININE 0.76 0.70  CALCIUM 8.5* 8.5*   PT/INR  Recent Labs  10/11/14 1543  LABPROT 18.5*  INR 1.54*   CMP     Component Value Date/Time   NA 137 2014/11/01 0525   K 3.8 2014/11/01 0525   CL 103 11/01/14 0525   CO2 25 11-01-2014 0525   GLUCOSE 97 11/01/14 0525   BUN <5* 2014-11-01 0525   CREATININE 0.70 11-01-2014 0525   CREATININE 0.81 07/12/2014 1717   CALCIUM 8.5* 11-01-14 0525   PROT 4.8* 10/12/2014 0615   ALBUMIN 2.8* 10/12/2014 0615   AST 16 10/12/2014 0615   ALT 26 10/12/2014 0615   ALKPHOS 41 10/12/2014 0615   BILITOT 0.4 10/12/2014 0615   GFRNONAA >60 11/01/14 0525   GFRNONAA >89  07/12/2014 1717   GFRAA >60 11-01-2014 0525   GFRAA >89 07/12/2014 1717   Lipase     Component Value Date/Time   LIPASE 26 10/07/2014 2125       Studies/Results: Dg Abd 2 Views  11-01-2014   CLINICAL DATA:  24 year old female with severe mid abdominal pain. Reported normal bowel movements.  EXAM: ABDOMEN - 2 VIEW  COMPARISON:  10/13/2014 abdominal radiographs.  FINDINGS: Multiple surgical clips overlie the left lower pelvis. No residual air-fluid levels are seen in the abdomen. There is minimal gas in the small bowel, with no dilated small bowel loops. There is normal colonic gas, with no appreciable stool. No pneumatosis, pneumoperitoneum or pathologic soft tissue calcifications. Visualized osseous structures appear intact.  IMPRESSION: Nonobstructive bowel gas pattern. Resolved air-fluid levels. No free intraperitoneal air.   Electronically Signed   By: Delbert Phenix M.D.   On: 2014/11/01 09:30   Dg Abd 2 Views  10/13/2014   CLINICAL DATA:  Abdominal pain. History of left portal vein thrombosis  EXAM: ABDOMEN - 2 VIEW  COMPARISON:  CT abdomen and pelvis October 11, 2014  FINDINGS: Supine and upright abdomen images were obtained. There is a paucity of small bowel gas. Colon is not dilated. There are  multiple air-fluid levels on the upright image. No free air. There is postoperative change in the left pelvis. There are phleboliths in left pelvis is well. The lung bases are clear.  IMPRESSION: Multiple air-fluid levels. Paucity of small bowel gas. These findings suggest enteritis or a degree of ileus. Obstruction is possible but felt to be somewhat less likely. No free air apparent.   Electronically Signed   By: Bretta Bang III M.D.   On: 10/13/2014 13:46    Anti-infectives: Anti-infectives    Start     Dose/Rate Route Frequency Ordered Stop   10/08/14 0645  piperacillin-tazobactam (ZOSYN) IVPB 3.375 g     3.375 g 100 mL/hr over 30 Minutes Intravenous  Once 10/08/14 0630 10/08/14 0737        Assessment/Plan Abdominal pain Left portal vein thrombosis TI and cecum thickening/inflammation -Hemodynamically stable. WBC normal.  Bowel sounds are present and abdomen is soft. Having BM's and flatus.  Pain seems out of proportion.  -KUB shows non-obstructive bowel gas pattern.  Air fluid levels have resolved.  No pneumoperitoneum.  -On heparin drip -No indications for urgent surgery.  -Consider repeat CT soon if pain does not resolve, hold off for now -She's requesting clears, I think that's fine as tolerated    LOS: 6 days    Nonie Hoyer 10/14/2014, 10:19 AM Pager: [phone]

## 2014-10-14 NOTE — Progress Notes (Signed)
Daily Rounding Note  10/14/2014, 9:19 AM  LOS: 6 days   SUBJECTIVE:       Abdominal pain persists as does nausea. Surgery following.  Pt is NPO except sips but she feels pain and nausea are better if she is allowed po.  Having loose stools  OBJECTIVE:         Vital signs in last 24 hours:    Temp:  [98.5 F (36.9 C)-99.3 F (37.4 C)] 99.3 F (37.4 C) (08/05 0444) Pulse Rate:  [58-65] 58 (08/05 0444) Resp:  [15-18] 18 (08/05 0444) BP: (97-128)/(41-60) 123/60 mmHg (08/05 0444) SpO2:  [95 %-100 %] 95 % (08/05 0444) Last BM Date: 08-Nov-2014 Filed Weights   10/07/14 2046  Weight: 170 lb (77.111 kg)   General: looks well, somewhat uncomfortable   Heart: RRR Chest: clear bil.  No labored breathing Abdomen: soft, hypoactive BS.  Tender to lightest palpation, no guard or rebound.   Extremities: no CCE Neuro/Psych:  Depressed, calm.  Alert.  No gross deficits.   Intake/Output from previous day: Nov 08, 2022 0701 - 08/05 0700 In: 733.3 [P.O.:360; I.V.:373.3] Out: 1800 [Urine:1800]  Intake/Output this shift: Total I/O In: 1629.2 [I.V.:1629.2] Out: -   Lab Results:  Recent Labs  10/12/14 0615 08-Nov-2014 0555 10/14/14 0525  WBC 5.8 5.7 6.2  HGB 8.2* 8.2* 8.8*  HCT 27.8* 28.0* 30.3*  PLT 185 190 209   BMET  Recent Labs  10/12/14 0615 11/08/2014 1208 10/14/14 0525  NA 138 137 137  K 3.9 3.4* 3.8  CL 109 102 103  CO2 24 28 25   GLUCOSE 89 100* 97  BUN <5* <5* <5*  CREATININE 0.69 0.76 0.70  CALCIUM 7.6* 8.5* 8.5*   LFT  Recent Labs  10/12/14 0615  PROT 4.8*  ALBUMIN 2.8*  AST 16  ALT 26  ALKPHOS 41  BILITOT 0.4   PT/INR  Recent Labs  10/11/14 1543  LABPROT 18.5*  INR 1.54*   Hepatitis Panel No results for input(s): HEPBSAG, HCVAB, HEPAIGM, HEPBIGM in the last 72 hours.  Studies/Results: Dg Abd 2 Views  November 08, 2014   CLINICAL DATA:  Abdominal pain. History of left portal vein thrombosis  EXAM:  ABDOMEN - 2 VIEW  COMPARISON:  CT abdomen and pelvis October 11, 2014  FINDINGS: Supine and upright abdomen images were obtained. There is a paucity of small bowel gas. Colon is not dilated. There are multiple air-fluid levels on the upright image. No free air. There is postoperative change in the left pelvis. There are phleboliths in left pelvis is well. The lung bases are clear.  IMPRESSION: Multiple air-fluid levels. Paucity of small bowel gas. These findings suggest enteritis or a degree of ileus. Obstruction is possible but felt to be somewhat less likely. No free air apparent.   Electronically Signed   By: Lowella Grip III M.D.   On: 08-Nov-2014 13:46    Scheduled Meds: . FLUoxetine  20 mg Oral Daily  . levETIRAcetam  500 mg Oral BID  . Linaclotide  145 mcg Oral Daily  . magnesium hydroxide  30 mL Oral Daily  . methadone  85 mg Oral Daily  . methylnaltrexone  12 mg Subcutaneous QODAY  . nicotine  21 mg Transdermal Daily  . ondansetron  4 mg Oral Q12H   Continuous Infusions: . sodium chloride 100 mL/hr at 10/14/14 0032  . heparin 1,250 Units/hr (08-Nov-2014 1300)   PRN Meds:.acetaminophen **OR** acetaminophen, alum & mag hydroxide-simeth, metoCLOPramide (REGLAN)  injection, morphine injection, ondansetron **OR** ondansetron (ZOFRAN) IV   ASSESMENT:    *  Left PV thrombosis.  Pneumatosis initially, follow up CT 8/2:distal SB and cecal inflammation. On Heparin gtt. Ongoing abd pain, though seems better. No fever, consistently normal WBCs, no abx on board.   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  Pt states she held Eliquis PTA for 3 days during menses (as has been advised and doing since starting the med). No oral or injectable contraceptives at home.   * Chronic pain and narcotics dependent (Methadone).  Low threshold for pain tolerance.   *  Microcytic anemia. S/p Feraheme 8/4.     *  Chronic constipation, moving bowels on Linzess and Relistor (both new this admit), MOM.   PLAN   *  Stop Relistor, continue Linzess and MOM. Allow clears.       Azucena Freed  10/14/2014, 9:19 AM Pager: [phone]   ________________________________________________________________________  [name] GI MD note:  I personally examined the patient, reviewed the data and agree with the assessment and plan described above.   Owens Loffler, MD [facility] Pager [phone]

## 2014-10-14 NOTE — Progress Notes (Signed)
PATIENT DETAILS Name: Victoria Alvarez Age: 24 y.o. Sex: female Date of Birth: 1990/04/07 Admit Date: 10/07/2014 Admitting Physician Theodis Blaze, MD [id number], Lennox Laity, MD  Subjective: Worsening abd pain-hematochezia this am.Very uncomfortable.  Assessment/Plan: Principal Problem: Abd pain: Initially felt to be from constipation and pneumatosis intestinalis, however since patient continued to have persistent pain inspite of treatment of constipation, repeat CT done on 8/2 now shows left portal venous thrombosis.Maintained on IV heparin-unfortunately continues to have worsening pain with mostly mid and right-sided abdominal tenderness. Gastroenterology and general surgery continue to follow. Will require close serial examinations, and if pain persists we will repeat CT abdomen in the next few days  Left portal venous sinus thrombosis: Continue IV heparin. Hypercoagulable panel ordered-respective of result will require lifelong anticoagulation. Appreciate hematology and vascular surgery evaluation. Outpatient appointment with hematology already scheduled for 8/23.  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 Since she has now developed portal venous sinus thrombosis in spite of Eliquis treatment-suspect she may not have been very compliant-does claim that she has been told to hold Eliquis for 3 days during her menses. Likely has underlying hypercoagulable state,  Chronic Constipation:continue Linzess  Anemia: No evidence of overt blood loss, suspect multifactorial from secondary to acute illness and chronic blood loss during menses. Given IV Iron during this hospitalization. Follow CBC.  Chronic pain/history of interstitial cystitis: Continue with methadone.  Disposition: Remain inpatient  Antimicrobial agents  See  below  Anti-infectives    Start     Dose/Rate Route Frequency Ordered Stop   10/08/14 0645  piperacillin-tazobactam (ZOSYN) IVPB 3.375 g     3.375 g 100 mL/hr over 30 Minutes Intravenous  Once 10/08/14 0630 10/08/14 0737      DVT Prophylaxis: IV Heparin gtt  Code Status: Full code  Family Communication None at bedside  Procedures: None  CONSULTS:  GI, hematology/oncology, general surgery and vascular surgery  Time spent 20 minutes-Greater than 50% of this time was spent in counseling, explanation of diagnosis, planning of further management, and coordination of care.  MEDICATIONS: Scheduled Meds: . FLUoxetine  20 mg Oral Daily  . levETIRAcetam  500 mg Oral BID  . Linaclotide  145 mcg Oral Daily  . magnesium hydroxide  30 mL Oral Daily  . methadone  85 mg Oral Daily  . nicotine  21 mg Transdermal Daily  . ondansetron  4 mg Oral Q12H   Continuous Infusions: . sodium chloride 100 mL/hr at 10/14/14 0032  . heparin 1,250 Units/hr (10/13/14 1300)   PRN Meds:.acetaminophen **OR** acetaminophen, alum & mag hydroxide-simeth, metoCLOPramide (REGLAN) injection, morphine injection, ondansetron **OR** ondansetron (ZOFRAN) IV    PHYSICAL EXAM: Vital signs in last 24 hours: Filed Vitals:   10/13/14 1510 10/13/14 2049 10/13/14 2225 10/14/14 0444  BP: 101/41 97/53 104/45 123/60  Pulse: 60 62 60 58  Temp: 98.5 F (36.9 C) 98.5 F (36.9 C)  99.3 F (37.4 C)  TempSrc: Oral Oral  Oral  Resp: 15 18  18   Height:      Weight:      SpO2: 100% 98%  95%    Weight change:  Filed Weights   10/07/14 2046  Weight: 77.111 kg (170 lb)   Body mass index is 31.09 kg/(m^2).   Gen Exam: Awake and alert with clear speech.  Neck: Supple, No JVD.   Chest: B/L Clear.   CVS: S1 S2 Regular, no murmurs.  Abdomen: soft, BS +, diffusely tender-mostly on her right side Extremities: no edema, lower extremities warm to touch. Neurologic: Non Focal.   Skin: No Rash.   Wounds: N/A.    Intake/Output from previous day:  Intake/Output Summary (Last 24 hours) at 10/14/14 1033 Last data filed at 10/14/14 0900  Gross per 24 hour  Intake 2002.5 ml  Output   1800 ml  Net  202.5 ml     LAB RESULTS: CBC  Recent Labs Lab 10/07/14 2125  10/10/14 0600 10/11/14 0555 10/12/14 0615 10/13/14 0555 10/14/14 0525  WBC 7.3  < > 9.5 8.0 5.8 5.7 6.2  HGB 10.2*  < > 9.3* 10.0* 8.2* 8.2* 8.8*  HCT 33.7*  < > 32.4* 34.4* 27.8* 28.0* 30.3*  PLT 215  < > 231 212 185 190 209  MCV 64.6*  < > 65.2* 66.2* 65.9* 64.5* 64.1*  MCH 19.5*  < > 18.7* 19.2* 19.4* 18.9* 18.6*  MCHC 30.3  < > 28.7* 29.1* 29.5* 29.3* 29.0*  RDW 20.6*  < > 20.7* 20.5* 20.8* 20.9* 20.8*  LYMPHSABS 1.0  --   --   --   --   --   --   MONOABS 0.3  --   --   --   --   --   --   EOSABS 0.0  --   --   --   --   --   --   BASOSABS 0.0  --   --   --   --   --   --   < > = values in this interval not displayed.  Chemistries   Recent Labs Lab 10/09/14 0633 10/10/14 0600 10/12/14 0615 10/13/14 1208 10/14/14 0525  NA 137 136 138 137 137  K 3.3* 3.8 3.9 3.4* 3.8  CL 106 109 109 102 103  CO2 24 22 24 28 25   GLUCOSE 113* 106* 89 100* 97  BUN 9 <5* <5* <5* <5*  CREATININE 0.87 0.64 0.69 0.76 0.70  CALCIUM 7.9* 8.2* 7.6* 8.5* 8.5*  MG  --  1.9  --   --   --     CBG: No results for input(s): GLUCAP in the last 168 hours.  GFR Estimated Creatinine Clearance: 103.4 mL/min (by C-G formula based on Cr of 0.7).  Coagulation profile  Recent Labs Lab 10/11/14 1543  INR 1.54*    Cardiac Enzymes No results for input(s): CKMB, TROPONINI, MYOGLOBIN in the last 168 hours.  Invalid input(s): CK  Invalid input(s): POCBNP No results for input(s): DDIMER in the last 72 hours. No results for input(s): HGBA1C in the last 72 hours. No results for input(s): CHOL, HDL, LDLCALC, TRIG, CHOLHDL, LDLDIRECT in the last 72 hours. No results for input(s): TSH, T4TOTAL, T3FREE, THYROIDAB in the last 72 hours.  Invalid  input(s): FREET3 No results for input(s): VITAMINB12, FOLATE, FERRITIN, TIBC, IRON, RETICCTPCT in the last 72 hours. No results for input(s): LIPASE, AMYLASE in the last 72 hours.  Urine Studies No results for input(s): UHGB, CRYS in the last 72 hours.  Invalid input(s): UACOL, UAPR, USPG, UPH, UTP, UGL, UKET, UBIL, UNIT, UROB, ULEU, UEPI, UWBC, URBC, UBAC, CAST, UCOM, BILUA  MICROBIOLOGY: Recent Results (from the past 240 hour(s))  Culture, blood (routine x 2)     Status: None (Preliminary result)   Collection Time: 10/13/14  6:55 PM  Result Value Ref Range  Status   Specimen Description BLOOD RIGHT FOREARM  Final   Special Requests BOTTLES DRAWN AEROBIC AND ANAEROBIC 4.5CC EACH  Final   Culture PENDING  Incomplete   Report Status PENDING  Incomplete    RADIOLOGY STUDIES/RESULTS: Ct Abdomen Pelvis Wo Contrast  10/08/2014   CLINICAL DATA:  Generalized abdominal pain, greatest in the left lower quadrant.  EXAM: CT ABDOMEN AND PELVIS WITHOUT CONTRAST  TECHNIQUE: Multidetector CT imaging of the abdomen and pelvis was performed following the standard protocol without IV contrast.  COMPARISON:  07/17/2013  FINDINGS: There is pneumatosis of the cecum and ascending colon with a generous volume of air in the wall. There also is a little air which appears to extend toward the undersurface of the gallbladder and liver, perhaps in the hepatoduodenal ligament. There is a small bubble of air in the porta hepatis which may reside within the portal vein. No frank free intraperitoneal air is evident. Despite the rather generous volume of pneumatosis, there is no significant inflammatory change about the cecum or ascending colon. There is no bowel obstruction. Appendix is normal.  There are unremarkable unenhanced appearances of the liver, spleen, pancreas, adrenals and kidneys. Uterus and ovaries appear normal. There is no significant abnormality in the lower chest. There is no significant musculoskeletal  abnormality.  IMPRESSION: Pneumatosis of the cecum and ascending colon. There are a few bubbles of air extending up toward the under surface of the gallbladder but the air does not appear to be free in the peritoneum. There also may be a bubble of air in the portal vein. Despite the generous volume of extraluminal air, there is little or no inflammatory change. There is no obstruction. There is no ascites. These results were called by telephone at the time of interpretation on 10/08/2014 at 2:51 am to Dr. Dina Rich, who verbally acknowledged these results.   Electronically Signed   By: Andreas Newport M.D.   On: 10/08/2014 02:57   Dg Chest 2 View  10/06/2014   CLINICAL DATA:  Abdominal pain and shortness of breath for 5 months.  EXAM: CHEST  2 VIEW  COMPARISON:  Chest radiograph 06/12/2014  FINDINGS: The heart is normal in size. Mild diffuse bronchial thickening. Pulmonary vasculature is normal. No consolidation, pleural effusion, or pneumothorax. No acute osseous abnormalities are seen.  IMPRESSION: Mild diffuse bronchial thickening, can be seen with asthma or bronchitis.   Electronically Signed   By: Jeb Levering M.D.   On: 10/06/2014 21:29   Ct Abdomen Pelvis W Contrast  10/11/2014   CLINICAL DATA:  Abdominal pain. History of hernia repair, pyelonephritis, seizures, CVA and substance abuse. Patient discontinued Eliquis therapy prior to admission. Initial encounter.  EXAM: CT ABDOMEN AND PELVIS WITH CONTRAST  TECHNIQUE: Multidetector CT imaging of the abdomen and pelvis was performed using the standard protocol following bolus administration of intravenous contrast.  CONTRAST:  100 ml Omnipaque 300.  COMPARISON:  Abdominal CT 10/08/2014 and 07/17/2013  FINDINGS: Lower chest: Increased volume loss, ground-glass opacity and linear densities within the right middle and lower lobes may reflect atelectasis or infection. Small right pleural effusion.  Hepatobiliary: There is left portal vein thrombosis with  scattered low-density throughout the left hepatic lobe. No suspicious hepatic lesions demonstrated. The right and main portal veins are patent. No evidence of gallstones, gallbladder wall thickening or biliary dilatation.  Pancreas: Unremarkable. No pancreatic ductal dilatation or surrounding inflammatory changes.  Spleen: Normal in size without focal abnormality.  Adrenals/Urinary Tract: Both adrenal glands appear normal.The  kidneys appear normal without evidence of urinary tract calculus, suspicious lesion or hydronephrosis. No bladder abnormalities are seen.  Stomach/Bowel: The stomach and proximal small bowel appear normal. There is distal small bowel wall thickening with mild surrounding inflammatory change. There is also mild wall thickening of the cecum. No residual pneumatosis identified.The appendix appears normal. There is small amount of free fluid in the right upper pelvis.  Vascular/Lymphatic: There are no enlarged abdominal or pelvic lymph nodes. As above, there is left portal vein thrombosis. The right and main portal veins are patent. The superior mesenteric and splenic veins are patent. No significant arterial findings.  Reproductive: Unremarkable.  Other: Postsurgical changes in the left groin without evidence of recurrent hernia.  Musculoskeletal: No acute or significant osseous findings.  IMPRESSION: 1. Occlusive left portal vein thrombosis, likely secondary to discontinued anti coagulation. The main portal, superior mesenteric and splenic veins are patent. 2. Mild wall thickening of the terminal ileum and cecum with surrounding inflammation, possibly secondary to infectious enteritis, vasculitis or venous thrombosis. No evidence of arterial occlusion, perforation or abscess. 3. Increased predominate linear opacity at the right lung base, likely atelectasis. 4. These results were called by telephone at the time of interpretation on 10/11/2014 at 2:00 pm to Dr. Lala Lund , who verbally  acknowledged these results.   Electronically Signed   By: Richardean Sale M.D.   On: 10/11/2014 14:04   Dg Abd 2 Views  10/14/2014   CLINICAL DATA:  24 year old female with severe mid abdominal pain. Reported normal bowel movements.  EXAM: ABDOMEN - 2 VIEW  COMPARISON:  10/13/2014 abdominal radiographs.  FINDINGS: Multiple surgical clips overlie the left lower pelvis. No residual air-fluid levels are seen in the abdomen. There is minimal gas in the small bowel, with no dilated small bowel loops. There is normal colonic gas, with no appreciable stool. No pneumatosis, pneumoperitoneum or pathologic soft tissue calcifications. Visualized osseous structures appear intact.  IMPRESSION: Nonobstructive bowel gas pattern. Resolved air-fluid levels. No free intraperitoneal air.   Electronically Signed   By: Ilona Sorrel M.D.   On: 10/14/2014 09:30   Dg Abd 2 Views  10/13/2014   CLINICAL DATA:  Abdominal pain. History of left portal vein thrombosis  EXAM: ABDOMEN - 2 VIEW  COMPARISON:  CT abdomen and pelvis October 11, 2014  FINDINGS: Supine and upright abdomen images were obtained. There is a paucity of small bowel gas. Colon is not dilated. There are multiple air-fluid levels on the upright image. No free air. There is postoperative change in the left pelvis. There are phleboliths in left pelvis is well. The lung bases are clear.  IMPRESSION: Multiple air-fluid levels. Paucity of small bowel gas. These findings suggest enteritis or a degree of ileus. Obstruction is possible but felt to be somewhat less likely. No free air apparent.   Electronically Signed   By: Lowella Grip III M.D.   On: 10/13/2014 13:46   Dg Abd 2 Views  10/09/2014   CLINICAL DATA:  Generalized abdominal pain. Nausea vomiting diarrhea for 2 days  EXAM: ABDOMEN - 2 VIEW  COMPARISON:  CT 10/06/2014, radiograph 10/06/2014  FINDINGS: The oral contrast from CT of 10/06/2014 has progressed into the rectum. There is mild decrease in the volume of stool  seen on comparison exam. No dilated loops of large or small bowel. NO intraoperative free air.  IMPRESSION: No evidence of bowel obstruction. Progression of oral contrast the rectum from CT of 10/06/2014.   Electronically Signed  By: Suzy Bouchard M.D.   On: 10/09/2014 09:33   Dg Abd 2 Views  10/06/2014   CLINICAL DATA:  Abdominal pain and diarrhea for 5 months.  EXAM: ABDOMEN - 2 VIEW  COMPARISON:  07/13/2014  FINDINGS: There is no free intra-abdominal air. No dilated bowel loops to suggest obstruction. Moderate volume of stool throughout the colon. No radiopaque calculi. Pelvic phleboliths and surgical clips projecting over the left inguinal region, unchanged. There is chronic widening of the pubic symphysis. No acute osseous abnormalities are seen.  IMPRESSION: Normal abdominal radiographs with moderate volume of colonic stool.   Electronically Signed   By: Jeb Levering M.D.   On: 10/06/2014 21:32   Dg Abd Portable 1v  10/11/2014   CLINICAL DATA:  Generalized abdominal pain, nausea, vomiting and constipation, over the past 4 days. Initial encounter.  EXAM: PORTABLE ABDOMEN - 1 VIEW  COMPARISON:  Abdominal radiograph performed 10/09/2014  FINDINGS: The colon contains a small amount of air but is otherwise empty. No bowel dilatation is seen to suggest obstruction. No free intra-abdominal air is seen, though evaluation for free air is limited on a single supine view.  No acute osseous abnormalities are identified.  IMPRESSION: Colon contains a small amount of air but is otherwise empty. No evidence for bowel obstruction. No radiographic evidence to suggest constipation.   Electronically Signed   By: Garald Balding M.D.   On: 10/11/2014 00:36    Oren Binet, MD  [facility] Pager:[phone]  If 7PM-7AM, please contact night-coverage [email or web address] Password [email or web address] 10/14/2014, 10:33 AM   LOS: 6 days

## 2014-10-15 LAB — BASIC METABOLIC PANEL
Anion gap: 6 (ref 5–15)
BUN: <5 mg/dL — ABNORMAL LOW (ref 6–20)
CO2: 28 mmol/L (ref 22–32)
Calcium: 8.4 mg/dL — ABNORMAL LOW (ref 8.9–10.3)
Chloride: 106 mmol/L (ref 101–111)
Creatinine, Ser: 0.72 mg/dL (ref 0.44–1.00)
GFR calc Af Amer: >60 mL/min (ref >60)
GFR calc non Af Amer: >60 mL/min (ref >60)
Glucose, Bld: 94 mg/dL (ref 65–99)
Potassium: 3.6 mmol/L (ref 3.5–5.1)
Sodium: 140 mmol/L (ref 135–145)

## 2014-10-15 LAB — CBC
HCT: 30.2 % — ABNORMAL LOW (ref 36.0–46.0)
Hemoglobin: 8.8 g/dL — ABNORMAL LOW (ref 12.0–15.0)
MCH: 19.2 pg — ABNORMAL LOW (ref 26.0–34.0)
MCHC: 29.1 g/dL — ABNORMAL LOW (ref 30.0–36.0)
MCV: 65.8 fL — ABNORMAL LOW (ref 78.0–100.0)
Platelets: 201 10*3/uL (ref 150–400)
RBC: 4.59 MIL/uL (ref 3.87–5.11)
RDW: 21.2 % — ABNORMAL HIGH (ref 11.5–15.5)
WBC: 6.8 10*3/uL (ref 4.0–10.5)

## 2014-10-15 LAB — HEPARIN LEVEL (UNFRACTIONATED): Heparin Unfractionated: 0.59 IU/mL (ref 0.30–0.70)

## 2014-10-15 NOTE — Progress Notes (Signed)
[facility] Progress Note    Since last GI note: Sleeping this am but woke enough to talk for a bit.  She is hungry, would like to advance diet. The pain is "not too bad."  Has ambulated in halls.  Objective: Vital signs in last 24 hours: Temp:  [98.6 F (37 C)-98.9 F (37.2 C)] 98.7 F (37.1 C) (08/06 0602) Pulse Rate:  [50-54] 52 (08/06 0602) Resp:  [18-19] 19 (08/06 0602) BP: (106-115)/(49-60) 107/52 mmHg (08/06 0602) SpO2:  [96 %-97 %] 97 % (08/06 0602) Last BM Date: October 17, 2014 General: alert and oriented times 3 Heart: regular rate and rythm Abdomen: soft, non-tender, non-distended, normal bowel sounds   Lab Results:  Recent Labs  10/13/14 0555 10/17/2014 0525 10/15/14 0448  WBC 5.7 6.2 6.8  HGB 8.2* 8.8* 8.8*  PLT 190 209 201  MCV 64.5* 64.1* 65.8*    Recent Labs  10/13/14 1208 October 17, 2014 0525 10/15/14 0448  NA 137 137 140  K 3.4* 3.8 3.6  CL 102 103 106  CO2 28 25 28   GLUCOSE 100* 97 94  BUN <5* <5* <5*  CREATININE 0.76 0.70 0.72  CALCIUM 8.5* 8.5* 8.4*   Studies/Results: Dg Abd 2 Views  October 17, 2014   CLINICAL DATA:  24 year old female with severe mid abdominal pain. Reported normal bowel movements.  EXAM: ABDOMEN - 2 VIEW  COMPARISON:  10/13/2014 abdominal radiographs.  FINDINGS: Multiple surgical clips overlie the left lower pelvis. No residual air-fluid levels are seen in the abdomen. There is minimal gas in the small bowel, with no dilated small bowel loops. There is normal colonic gas, with no appreciable stool. No pneumatosis, pneumoperitoneum or pathologic soft tissue calcifications. Visualized osseous structures appear intact.  IMPRESSION: Nonobstructive bowel gas pattern. Resolved air-fluid levels. No free intraperitoneal air.   Electronically Signed   By: Ilona Sorrel M.D.   On: Oct 17, 2014 09:30   Dg Abd 2 Views  10/13/2014   CLINICAL DATA:  Abdominal pain. History of left portal vein thrombosis  EXAM: ABDOMEN - 2 VIEW  COMPARISON:  CT  abdomen and pelvis October 11, 2014  FINDINGS: Supine and upright abdomen images were obtained. There is a paucity of small bowel gas. Colon is not dilated. There are multiple air-fluid levels on the upright image. No free air. There is postoperative change in the left pelvis. There are phleboliths in left pelvis is well. The lung bases are clear.  IMPRESSION: Multiple air-fluid levels. Paucity of small bowel gas. These findings suggest enteritis or a degree of ileus. Obstruction is possible but felt to be somewhat less likely. No free air apparent.   Electronically Signed   By: Lowella Grip III M.D.   On: 10/13/2014 13:46     Medications: Scheduled Meds: . FLUoxetine  20 mg Oral Daily  . levETIRAcetam  500 mg Oral BID  . Linaclotide  145 mcg Oral Daily  . magnesium hydroxide  30 mL Oral Daily  . methadone  85 mg Oral Daily  . nicotine  21 mg Transdermal Daily  . ondansetron  4 mg Oral Q12H  . polyethylene glycol  17 g Oral Daily   Continuous Infusions: . sodium chloride 100 mL/hr at 10/15/14 0610  . heparin 1,250 Units/hr (10/15/14 0753)   PRN Meds:.acetaminophen **OR** acetaminophen, alum & mag hydroxide-simeth, metoCLOPramide (REGLAN) injection, morphine injection, ondansetron **OR** ondansetron (ZOFRAN) IV    Assessment/Plan: 24 y.o. female with blood clotting disorder  S/p stroke, s/p newly diagnosed portal vein thrombus with (presumed) resulting damage to  right colon  (presented with abd pain, pneumatosis of right colon).  Blood thinning with heparin per hematology.  She has chronic pain issues, on methadone at admit and so it is quite difficult to sort out her abd pain complaints.  She is asking to eat solid food however, is not tender on exam and so I think it is safe to let her try solids today. Will order.    Milus Banister, MD  10/15/2014, 8:18 AM [facility] Pager [phone]

## 2014-10-15 NOTE — Progress Notes (Signed)
ANTICOAGULATION CONSULT NOTE - Follow Up Consult  Pharmacy Consult for heparin Indication: portal vein thrombosis  Allergies  Allergen Reactions  . Ativan [Lorazepam] Other (See Comments)    " I don't remember anything."  . Bee Venom Swelling  . Ciprofloxacin Diarrhea and Nausea And Vomiting    Patient Measurements: Height: 5\' 2"  (157.5 cm) Weight: 170 lb (77.111 kg) IBW/kg (Calculated) : 50.1 Heparin Dosing Weight: 67kg  Vital Signs: Temp: 98.7 F (37.1 C) (08/06 0602) Temp Source: Oral (08/06 0602) BP: 107/52 mmHg (08/06 0602) Pulse Rate: 52 (08/06 0602)  Labs:  Recent Labs  10/12/14 1458 10/12/14 2110  10/13/14 0555 10/13/14 1208 10/14/14 0525 10/15/14 0448  HGB  --   --   < > 8.2*  --  8.8* 8.8*  HCT  --   --   --  28.0*  --  30.3* 30.2*  PLT  --   --   --  190  --  209 201  APTT 75* 79*  --   --   --   --   --   HEPARINUNFRC 0.89* 0.72*  --  0.69  --  0.40 0.59  CREATININE  --   --   --   --  0.76 0.70 0.72  < > = values in this interval not displayed.  Estimated Creatinine Clearance: 103.4 mL/min (by C-G formula based on Cr of 0.72).  Assessment: 24yo female with hx of stroke, sz, on Eliquis PTA. She stopped taking Eliquis a few days prior to admission. She was admitted with abdominal pain. CT of the abdomen showed a thrombosis of L portal vein. Suspect possible poor adherence w/ anticoagulation, will require lifelong anticoagulation.  8/5 pt noted black loose stool (recently received iron 8/4) HL = 0.59 (therapeutic), CBC stable.  Goal of Therapy:  Heparin level 0.3-0.7 units/ml Monitor platelets by anticoagulation protocol: Yes   Plan:  - Continue heparin at 1250 units/hr - Monitor daily HL, CBC, s/sx of bleeding - Plan for PO anticoag  Casilda Carls, PharmD. Clinical Pharmacist Resident Pager: [phone]

## 2014-10-15 NOTE — Progress Notes (Deleted)
ANTICOAGULATION CONSULT NOTE - Follow-Up Consult  Pharmacy Consult for Heparin  Indication: portal vein thrombosis  Allergies  Allergen Reactions  . Ativan [Lorazepam] Other (See Comments)    " I don't remember anything."  . Bee Venom Swelling  . Ciprofloxacin Diarrhea and Nausea And Vomiting   Labs:  Recent Labs  10/12/14 1458 10/12/14 2110  10/13/14 0555 10/13/14 1208 10/14/14 0525 10/15/14 0448  HGB  --   --   < > 8.2*  --  8.8* 8.8*  HCT  --   --   --  28.0*  --  30.3* 30.2*  PLT  --   --   --  190  --  209 201  APTT 75* 79*  --   --   --   --   --   HEPARINUNFRC 0.89* 0.72*  --  0.69  --  0.40 0.59  CREATININE  --   --   --   --  0.76 0.70 0.72  < > = values in this interval not displayed.  Estimated Creatinine Clearance: 103.4 mL/min (by C-G formula based on Cr of 0.72).  Assessment: 24yo female with hx of stroke, sz, on Eliquis PTA. She stopped taking Eliquis a few days prior to admission. She was admitted with abdominal pain, CT abdomen showed a thrombosis of L portal vein. Suspect possible poor adherence w/ anticoagulation, hypercoagulable panel pending.  Heparin level therapeutic  Goal of Therapy:  Heparin level 0.3-0.7 units/ml aPTT 66-102 seconds Monitor platelets by anticoagulation protocol: Yes   Plan:  -Continue heparin at 1250 units/hr -Monitor daily HL, CBC, s/sx of bleeding -F/u plan for PO anticoag  Thank you Okey Regal, PharmD [phone]  10/15/2014 8:16 AM ______________________________________________________

## 2014-10-15 NOTE — Progress Notes (Signed)
PATIENT DETAILS Name: Victoria Alvarez Age: 24 y.o. 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 Initial CT scan showed pneumatosis along with constipation, after providing supportive care/laxatives- she still continued to have abdominal pain. Repeat CT scan abdomen on 8/2 showed left portal vein thrombosis. Patient was subsequently started on IV heparin. Hospital course has been marked by slow improvement and persistent abdominal pain. Diet has been slowly advanced, general surgery and GI continue to follow. Plans are to follow closely with serial exams, and to perform a CT scan of the abdomen if no improvement.  Subjective: Abdominal pain is unchanged  Assessment/Plan: Principal Problem: Abd pain: Initially felt to be from constipation and pneumatosis intestinalis, however since patient continued to have persistent pain inspite of treatment of constipation, repeat CT done on 8/2 now shows left portal venous thrombosis.Maintained on IV heparin-continues to have pain with mostly mid and right-sided abdominal tenderness. Gastroenterology and general surgery continue to follow. Will require close serial examinations, and if pain persists we will repeat CT abdomen in the next few days.  Left portal venous sinus thrombosis: Continue IV heparin. Hypercoagulable panel ordered-respective of result will require lifelong anticoagulation. Appreciate hematology and vascular surgery evaluation. Outpatient appointment with hematology already scheduled for 8/23. Once clinically better, will need to be transitioned back to Eliquis (noncompliant Coumadin the past)  History of seizure: Continue with Keppra  History of cerebral venous sinus  thrombosis: Maintained on Eliquis prior to this admission-previously she was on Coumadin and was noncompliant. Since she has now developed portal venous sinus thrombosis in spite of Eliquis treatment-suspect she may not have been very compliant-does claim that she has been told to hold Eliquis for 3 days during her menses. Likely has underlying hypercoagulable state,  Chronic Constipation:continue Linzess and MiraLAX  Anemia: No evidence of overt blood loss, suspect multifactorial from secondary to acute illness and chronic blood loss during menses. Given IV Iron during this hospitalization. Follow CBC.  Chronic pain/history of interstitial cystitis: Continue with methadone.  Disposition: Remain inpatient-home once pain controlled with oral medications.  Antimicrobial agents  See below  Anti-infectives    Start     Dose/Rate Route Frequency Ordered Stop   10/08/14 0645  piperacillin-tazobactam (ZOSYN) IVPB 3.375 g     3.375 g 100 mL/hr over 30 Minutes Intravenous  Once 10/08/14 0630 10/08/14 0737      DVT Prophylaxis: IV Heparin gtt  Code Status: Full code  Family Communication None at bedside  Procedures: None  CONSULTS:  GI, hematology/oncology, general surgery and vascular surgery  Time spent 20 minutes-Greater than 50% of this time was spent in counseling, explanation of diagnosis, planning of further management, and coordination of care.  MEDICATIONS: Scheduled Meds: . FLUoxetine  20 mg Oral Daily  . levETIRAcetam  500 mg Oral BID  . Linaclotide  145 mcg Oral Daily  . magnesium hydroxide  30 mL Oral Daily  . methadone  85 mg Oral Daily  . nicotine  21 mg Transdermal Daily  . ondansetron  4 mg Oral Q12H  . polyethylene glycol  17 g Oral Daily   Continuous Infusions: . sodium chloride 100 mL/hr at 10/15/14 0610  . heparin 1,250 Units/hr (10/15/14 0753)  PRN Meds:.acetaminophen **OR** acetaminophen, alum & mag hydroxide-simeth, metoCLOPramide (REGLAN)  injection, morphine injection, ondansetron **OR** ondansetron (ZOFRAN) IV    PHYSICAL EXAM: Vital signs in last 24 hours: Filed Vitals:   10/14/14 1333 10/14/14 2109 10/15/14 0602 10/15/14 1316  BP: 115/49 106/60 107/52 102/50  Pulse: 54 50 52 63  Temp: 98.6 F (37 C) 98.9 F (37.2 C) 98.7 F (37.1 C) 98.7 F (37.1 C)  TempSrc: Oral Oral Oral Oral  Resp: 18 18 19 18   Height:      Weight:      SpO2: 96% 97% 97% 98%    Weight change:  Filed Weights   10/07/14 2046  Weight: 77.111 kg (170 lb)   Body mass index is 31.09 kg/(m^2).   Gen Exam: Awake and alert with clear speech. Neck: Supple, No JVD.   Chest: B/L Clear.   CVS: S1 S2 Regular, no murmurs.  Abdomen: soft, BS +, diffusely tender-mostly on her right side Extremities: no edema, lower extremities warm to touch. Neurologic: Non Focal.   Skin: No Rash.   Wounds: N/A.   Intake/Output from previous day:  Intake/Output Summary (Last 24 hours) at 10/15/14 1328 Last data filed at 10/15/14 1300  Gross per 24 hour  Intake 2907.5 ml  Output   1025 ml  Net 1882.5 ml     LAB RESULTS: CBC  Recent Labs Lab 10/11/14 0555 10/12/14 0615 10/13/14 0555 10/14/14 0525 10/15/14 0448  WBC 8.0 5.8 5.7 6.2 6.8  HGB 10.0* 8.2* 8.2* 8.8* 8.8*  HCT 34.4* 27.8* 28.0* 30.3* 30.2*  PLT 212 185 190 209 201  MCV 66.2* 65.9* 64.5* 64.1* 65.8*  MCH 19.2* 19.4* 18.9* 18.6* 19.2*  MCHC 29.1* 29.5* 29.3* 29.0* 29.1*  RDW 20.5* 20.8* 20.9* 20.8* 21.2*    Chemistries   Recent Labs Lab 10/10/14 0600 10/12/14 0615 10/13/14 1208 10/14/14 0525 10/15/14 0448  NA 136 138 137 137 140  K 3.8 3.9 3.4* 3.8 3.6  CL 109 109 102 103 106  CO2 22 24 28 25 28   GLUCOSE 106* 89 100* 97 94  BUN <5* <5* <5* <5* <5*  CREATININE 0.64 0.69 0.76 0.70 0.72  CALCIUM 8.2* 7.6* 8.5* 8.5* 8.4*  MG 1.9  --   --   --   --     CBG: No results for input(s): GLUCAP in the last 168 hours.  GFR Estimated Creatinine Clearance: 103.4 mL/min (by  C-G formula based on Cr of 0.72).  Coagulation profile  Recent Labs Lab 10/11/14 1543  INR 1.54*    Cardiac Enzymes No results for input(s): CKMB, TROPONINI, MYOGLOBIN in the last 168 hours.  Invalid input(s): CK  Invalid input(s): POCBNP No results for input(s): DDIMER in the last 72 hours. No results for input(s): HGBA1C in the last 72 hours. No results for input(s): CHOL, HDL, LDLCALC, TRIG, CHOLHDL, LDLDIRECT in the last 72 hours. No results for input(s): TSH, T4TOTAL, T3FREE, THYROIDAB in the last 72 hours.  Invalid input(s): FREET3 No results for input(s): VITAMINB12, FOLATE, FERRITIN, TIBC, IRON, RETICCTPCT in the last 72 hours. No results for input(s): LIPASE, AMYLASE in the last 72 hours.  Urine Studies No results for input(s): UHGB, CRYS in the last 72 hours.  Invalid input(s): UACOL, UAPR, USPG, UPH, UTP, UGL, UKET, UBIL, UNIT, UROB, ULEU, UEPI, UWBC, URBC, UBAC, CAST, UCOM, BILUA  MICROBIOLOGY: Recent Results (from the past 240 hour(s))  Culture, blood (routine x 2)     Status: None (Preliminary result)   Collection Time:  10/13/14  6:55 PM  Result Value Ref Range Status   Specimen Description BLOOD RIGHT FOREARM  Final   Special Requests BOTTLES DRAWN AEROBIC AND ANAEROBIC 4.5CC EACH  Final   Culture NO GROWTH 2 DAYS  Final   Report Status PENDING  Incomplete  Culture, blood (routine x 2)     Status: None (Preliminary result)   Collection Time: 10/13/14  7:06 PM  Result Value Ref Range Status   Specimen Description BLOOD LEFT ARM  Final   Special Requests IN PEDIATRIC BOTTLE 1CC  Final   Culture NO GROWTH 2 DAYS  Final   Report Status PENDING  Incomplete    RADIOLOGY STUDIES/RESULTS: Ct Abdomen Pelvis Wo Contrast  10/08/2014   CLINICAL DATA:  Generalized abdominal pain, greatest in the left lower quadrant.  EXAM: CT ABDOMEN AND PELVIS WITHOUT CONTRAST  TECHNIQUE: Multidetector CT imaging of the abdomen and pelvis was performed following the standard  protocol without IV contrast.  COMPARISON:  07/17/2013  FINDINGS: There is pneumatosis of the cecum and ascending colon with a generous volume of air in the wall. There also is a little air which appears to extend toward the undersurface of the gallbladder and liver, perhaps in the hepatoduodenal ligament. There is a small bubble of air in the porta hepatis which may reside within the portal vein. No frank free intraperitoneal air is evident. Despite the rather generous volume of pneumatosis, there is no significant inflammatory change about the cecum or ascending colon. There is no bowel obstruction. Appendix is normal.  There are unremarkable unenhanced appearances of the liver, spleen, pancreas, adrenals and kidneys. Uterus and ovaries appear normal. There is no significant abnormality in the lower chest. There is no significant musculoskeletal abnormality.  IMPRESSION: Pneumatosis of the cecum and ascending colon. There are a few bubbles of air extending up toward the under surface of the gallbladder but the air does not appear to be free in the peritoneum. There also may be a bubble of air in the portal vein. Despite the generous volume of extraluminal air, there is little or no inflammatory change. There is no obstruction. There is no ascites. These results were called by telephone at the time of interpretation on 10/08/2014 at 2:51 am to Dr. Dina Rich, who verbally acknowledged these results.   Electronically Signed   By: Andreas Newport M.D.   On: 10/08/2014 02:57   Dg Chest 2 View  10/06/2014   CLINICAL DATA:  Abdominal pain and shortness of breath for 5 months.  EXAM: CHEST  2 VIEW  COMPARISON:  Chest radiograph 06/12/2014  FINDINGS: The heart is normal in size. Mild diffuse bronchial thickening. Pulmonary vasculature is normal. No consolidation, pleural effusion, or pneumothorax. No acute osseous abnormalities are seen.  IMPRESSION: Mild diffuse bronchial thickening, can be seen with asthma or  bronchitis.   Electronically Signed   By: Jeb Levering M.D.   On: 10/06/2014 21:29   Ct Abdomen Pelvis W Contrast  10/11/2014   CLINICAL DATA:  Abdominal pain. History of hernia repair, pyelonephritis, seizures, CVA and substance abuse. Patient discontinued Eliquis therapy prior to admission. Initial encounter.  EXAM: CT ABDOMEN AND PELVIS WITH CONTRAST  TECHNIQUE: Multidetector CT imaging of the abdomen and pelvis was performed using the standard protocol following bolus administration of intravenous contrast.  CONTRAST:  100 ml Omnipaque 300.  COMPARISON:  Abdominal CT 10/08/2014 and 07/17/2013  FINDINGS: Lower chest: Increased volume loss, ground-glass opacity and linear densities within the right middle and lower lobes  may reflect atelectasis or infection. Small right pleural effusion.  Hepatobiliary: There is left portal vein thrombosis with scattered low-density throughout the left hepatic lobe. No suspicious hepatic lesions demonstrated. The right and main portal veins are patent. No evidence of gallstones, gallbladder wall thickening or biliary dilatation.  Pancreas: Unremarkable. No pancreatic ductal dilatation or surrounding inflammatory changes.  Spleen: Normal in size without focal abnormality.  Adrenals/Urinary Tract: Both adrenal glands appear normal.The kidneys appear normal without evidence of urinary tract calculus, suspicious lesion or hydronephrosis. No bladder abnormalities are seen.  Stomach/Bowel: The stomach and proximal small bowel appear normal. There is distal small bowel wall thickening with mild surrounding inflammatory change. There is also mild wall thickening of the cecum. No residual pneumatosis identified.The appendix appears normal. There is small amount of free fluid in the right upper pelvis.  Vascular/Lymphatic: There are no enlarged abdominal or pelvic lymph nodes. As above, there is left portal vein thrombosis. The right and main portal veins are patent. The superior  mesenteric and splenic veins are patent. No significant arterial findings.  Reproductive: Unremarkable.  Other: Postsurgical changes in the left groin without evidence of recurrent hernia.  Musculoskeletal: No acute or significant osseous findings.  IMPRESSION: 1. Occlusive left portal vein thrombosis, likely secondary to discontinued anti coagulation. The main portal, superior mesenteric and splenic veins are patent. 2. Mild wall thickening of the terminal ileum and cecum with surrounding inflammation, possibly secondary to infectious enteritis, vasculitis or venous thrombosis. No evidence of arterial occlusion, perforation or abscess. 3. Increased predominate linear opacity at the right lung base, likely atelectasis. 4. These results were called by telephone at the time of interpretation on 10/11/2014 at 2:00 pm to Dr. Lala Lund , who verbally acknowledged these results.   Electronically Signed   By: Richardean Sale M.D.   On: 10/11/2014 14:04   Dg Abd 2 Views  10/14/2014   CLINICAL DATA:  24 year old female with severe mid abdominal pain. Reported normal bowel movements.  EXAM: ABDOMEN - 2 VIEW  COMPARISON:  10/13/2014 abdominal radiographs.  FINDINGS: Multiple surgical clips overlie the left lower pelvis. No residual air-fluid levels are seen in the abdomen. There is minimal gas in the small bowel, with no dilated small bowel loops. There is normal colonic gas, with no appreciable stool. No pneumatosis, pneumoperitoneum or pathologic soft tissue calcifications. Visualized osseous structures appear intact.  IMPRESSION: Nonobstructive bowel gas pattern. Resolved air-fluid levels. No free intraperitoneal air.   Electronically Signed   By: Ilona Sorrel M.D.   On: 10/14/2014 09:30   Dg Abd 2 Views  10/13/2014   CLINICAL DATA:  Abdominal pain. History of left portal vein thrombosis  EXAM: ABDOMEN - 2 VIEW  COMPARISON:  CT abdomen and pelvis October 11, 2014  FINDINGS: Supine and upright abdomen images were  obtained. There is a paucity of small bowel gas. Colon is not dilated. There are multiple air-fluid levels on the upright image. No free air. There is postoperative change in the left pelvis. There are phleboliths in left pelvis is well. The lung bases are clear.  IMPRESSION: Multiple air-fluid levels. Paucity of small bowel gas. These findings suggest enteritis or a degree of ileus. Obstruction is possible but felt to be somewhat less likely. No free air apparent.   Electronically Signed   By: Lowella Grip III M.D.   On: 10/13/2014 13:46   Dg Abd 2 Views  10/09/2014   CLINICAL DATA:  Generalized abdominal pain. Nausea vomiting diarrhea for 2 days  EXAM: ABDOMEN - 2 VIEW  COMPARISON:  CT 10/06/2014, radiograph 10/06/2014  FINDINGS: The oral contrast from CT of 10/06/2014 has progressed into the rectum. There is mild decrease in the volume of stool seen on comparison exam. No dilated loops of large or small bowel. NO intraoperative free air.  IMPRESSION: No evidence of bowel obstruction. Progression of oral contrast the rectum from CT of 10/06/2014.   Electronically Signed   By: Suzy Bouchard M.D.   On: 10/09/2014 09:33   Dg Abd 2 Views  10/06/2014   CLINICAL DATA:  Abdominal pain and diarrhea for 5 months.  EXAM: ABDOMEN - 2 VIEW  COMPARISON:  07/13/2014  FINDINGS: There is no free intra-abdominal air. No dilated bowel loops to suggest obstruction. Moderate volume of stool throughout the colon. No radiopaque calculi. Pelvic phleboliths and surgical clips projecting over the left inguinal region, unchanged. There is chronic widening of the pubic symphysis. No acute osseous abnormalities are seen.  IMPRESSION: Normal abdominal radiographs with moderate volume of colonic stool.   Electronically Signed   By: Jeb Levering M.D.   On: 10/06/2014 21:32   Dg Abd Portable 1v  10/11/2014   CLINICAL DATA:  Generalized abdominal pain, nausea, vomiting and constipation, over the past 4 days. Initial encounter.   EXAM: PORTABLE ABDOMEN - 1 VIEW  COMPARISON:  Abdominal radiograph performed 10/09/2014  FINDINGS: The colon contains a small amount of air but is otherwise empty. No bowel dilatation is seen to suggest obstruction. No free intra-abdominal air is seen, though evaluation for free air is limited on a single supine view.  No acute osseous abnormalities are identified.  IMPRESSION: Colon contains a small amount of air but is otherwise empty. No evidence for bowel obstruction. No radiographic evidence to suggest constipation.   Electronically Signed   By: Garald Balding M.D.   On: 10/11/2014 00:36    Oren Binet, MD  [facility] Pager:[phone]  If 7PM-7AM, please contact night-coverage [email or web address] Password [email or web address] 10/15/2014, 1:28 PM   LOS: 7 days

## 2014-10-15 NOTE — Progress Notes (Signed)
GS Progress Note Subjective: Pain unchanged, no nausea or vomiting overnight  Objective: Vital signs in last 24 hours: Temp:  [98.6 F (37 C)-98.9 F (37.2 C)] 98.7 F (37.1 C) (08/06 0602) Pulse Rate:  [50-54] 52 (08/06 0602) Resp:  [18-19] 19 (08/06 0602) BP: (106-115)/(49-60) 107/52 mmHg (08/06 0602) SpO2:  [96 %-97 %] 97 % (08/06 0602) Last BM Date: 01-Nov-2014  Intake/Output from previous day: November 01, 2022 0701 - 08/06 0700 In: 4216.7 [I.V.:4216.7] Out: 600 [Urine:600] Intake/Output this shift:    Lungs: CTAB  Abd: soft, moderate tenderness to lower midline area, no guarding or rebound  Extremities: no edema  Neuro: GCS 15 AO x4  Lab Results: CBC   Recent Labs  11-01-2014 0525 10/15/14 0448  WBC 6.2 6.8  HGB 8.8* 8.8*  HCT 30.3* 30.2*  PLT 209 201   BMET  Recent Labs  November 01, 2014 0525 10/15/14 0448  NA 137 140  K 3.8 3.6  CL 103 106  CO2 25 28  GLUCOSE 97 94  BUN <5* <5*  CREATININE 0.70 0.72  CALCIUM 8.5* 8.4*   PT/INR No results for input(s): LABPROT, INR in the last 72 hours. ABG No results for input(s): PHART, HCO3 in the last 72 hours.  Invalid input(s): PCO2, PO2  Studies/Results: Dg Abd 2 Views  2014-11-01   CLINICAL DATA:  24 year old female with severe mid abdominal pain. Reported normal bowel movements.  EXAM: ABDOMEN - 2 VIEW  COMPARISON:  10/13/2014 abdominal radiographs.  FINDINGS: Multiple surgical clips overlie the left lower pelvis. No residual air-fluid levels are seen in the abdomen. There is minimal gas in the small bowel, with no dilated small bowel loops. There is normal colonic gas, with no appreciable stool. No pneumatosis, pneumoperitoneum or pathologic soft tissue calcifications. Visualized osseous structures appear intact.  IMPRESSION: Nonobstructive bowel gas pattern. Resolved air-fluid levels. No free intraperitoneal air.   Electronically Signed   By: Ilona Sorrel M.D.   On: 2014/11/01 09:30   Dg Abd 2 Views  10/13/2014   CLINICAL  DATA:  Abdominal pain. History of left portal vein thrombosis  EXAM: ABDOMEN - 2 VIEW  COMPARISON:  CT abdomen and pelvis October 11, 2014  FINDINGS: Supine and upright abdomen images were obtained. There is a paucity of small bowel gas. Colon is not dilated. There are multiple air-fluid levels on the upright image. No free air. There is postoperative change in the left pelvis. There are phleboliths in left pelvis is well. The lung bases are clear.  IMPRESSION: Multiple air-fluid levels. Paucity of small bowel gas. These findings suggest enteritis or a degree of ileus. Obstruction is possible but felt to be somewhat less likely. No free air apparent.   Electronically Signed   By: Lowella Grip III M.D.   On: 10/13/2014 13:46    Anti-infectives: Anti-infectives    Start     Dose/Rate Route Frequency Ordered Stop   10/08/14 0645  piperacillin-tazobactam (ZOSYN) IVPB 3.375 g     3.375 g 100 mL/hr over 30 Minutes Intravenous  Once 10/08/14 0630 10/08/14 8590      Assessment/Plan: Porta vein thrombosis with abdominal pain pneumotosis R colon Continue to monitor abdomen, tolerating clears currently would stay there 1 more day. Continue heparin drip per heme.  LOS: 7 days    [employer]

## 2014-10-16 LAB — CBC
HCT: 31.3 % — ABNORMAL LOW (ref 36.0–46.0)
Hemoglobin: 9.2 g/dL — ABNORMAL LOW (ref 12.0–15.0)
MCH: 19.4 pg — ABNORMAL LOW (ref 26.0–34.0)
MCHC: 29.4 g/dL — ABNORMAL LOW (ref 30.0–36.0)
MCV: 65.9 fL — ABNORMAL LOW (ref 78.0–100.0)
Platelets: 215 10*3/uL (ref 150–400)
RBC: 4.75 MIL/uL (ref 3.87–5.11)
RDW: 21.5 % — ABNORMAL HIGH (ref 11.5–15.5)
WBC: 7.4 10*3/uL (ref 4.0–10.5)

## 2014-10-16 LAB — HEPARIN LEVEL (UNFRACTIONATED): Heparin Unfractionated: 0.63 IU/mL (ref 0.30–0.70)

## 2014-10-16 MED ORDER — APIXABAN 5 MG PO TABS
5.0000 mg | ORAL_TABLET | Freq: Two times a day (BID) | ORAL | Status: DC
  Administered 2014-10-16: 5 mg via ORAL
  Filled 2014-10-16: qty 1

## 2014-10-16 MED ORDER — APIXABAN 5 MG PO TABS: 5.0000 mg | ORAL_TABLET | Freq: Two times a day (BID) | ORAL | Status: DC

## 2014-10-16 MED ORDER — METHADONE HCL 5 MG PO TABS: 84.0000 mg | ORAL_TABLET | Freq: Every day | ORAL | Status: DC

## 2014-10-16 MED ORDER — DOCUSATE SODIUM 100 MG PO CAPS: 100.0000 mg | ORAL_CAPSULE | Freq: Two times a day (BID) | ORAL | Status: DC

## 2014-10-16 MED ORDER — POLYETHYLENE GLYCOL 3350 17 G PO PACK: 17.0000 g | PACK | Freq: Two times a day (BID) | ORAL | Status: DC | PRN

## 2014-10-16 MED ORDER — ONDANSETRON HCL 4 MG PO TABS: 4.0000 mg | ORAL_TABLET | Freq: Four times a day (QID) | ORAL | Status: DC | PRN

## 2014-10-16 NOTE — Progress Notes (Signed)
Pt d/c ed home, all instructions with handouts, prescriptions given, rolled her out in wheel chair. Pt skin all intact, had a shower this morning. No concerns.

## 2014-10-16 NOTE — Progress Notes (Signed)
Patient ID: Victoria Alvarez, female   DOB: 06-20-1990, 24 y.o.   MRN: [id number]    Progress Note   Subjective  Says she is depressed, has no appetite , not eating much-says being discharged home today   Objective   Vital signs in last 24 hours: Temp:  [98.3 F (36.8 C)-99.2 F (37.3 C)] 98.3 F (36.8 C) (08/07 0600) Pulse Rate:  [52-63] 52 (08/07 0600) Resp:  [18] 18 (08/07 0600) BP: (102-108)/(46-63) 108/63 mmHg (08/07 0600) SpO2:  [98 %-100 %] 99 % (08/07 0600) Last BM Date: 11/10/2014 General:    white female in NAD,flat affect Heart:  Regular rate and rhythm; no murmurs Lungs: Respirations even and unlabored, lungs CTA bilaterally Abdomen:  Soft,minimally tender and nondistended. Normal bowel sounds. Extremities:  Without edema. Neurologic:  Alert and oriented,  grossly normal neurologically. Psych:  Cooperative. Normal mood and affect.  Intake/Output from previous day: 08/06 0701 - 08/07 0700 In: 1527.5 [P.O.:320; I.V.:1207.5] Out: 1425 [Urine:1425] Intake/Output this shift:    Lab Results:  Recent Labs  11-10-14 0525 10/15/14 0448 10/16/14 0514  WBC 6.2 6.8 7.4  HGB 8.8* 8.8* 9.2*  HCT 30.3* 30.2* 31.3*  PLT 209 201 215   BMET  Recent Labs  10/13/14 1208 11/10/2014 0525 10/15/14 0448  NA 137 137 140  K 3.4* 3.8 3.6  CL 102 103 106  CO2 GLUCOSE 100* 97 94  BUN <5* <5* <5*  CREATININE 0.76 0.70 0.72  CALCIUM 8.5* 8.5* 8.4*   LFT No results for input(s): PROT, ALBUMIN, AST, ALT, ALKPHOS, BILITOT, BILIDIR, IBILI in the last 72 hours. PT/INR No results for input(s): LABPROT, INR in the last 72 hours.  Studies/Results: Dg Abd 2 Views  10-Nov-2014   CLINICAL DATA:  24 year old female with severe mid abdominal pain. Reported normal bowel movements.  EXAM: ABDOMEN - 2 VIEW  COMPARISON:  10/13/2014 abdominal radiographs.  FINDINGS: Multiple surgical clips overlie the left lower pelvis. No residual air-fluid levels are seen in the abdomen. There is  minimal gas in the small bowel, with no dilated small bowel loops. There is normal colonic gas, with no appreciable stool. No pneumatosis, pneumoperitoneum or pathologic soft tissue calcifications. Visualized osseous structures appear intact.  IMPRESSION: Nonobstructive bowel gas pattern. Resolved air-fluid levels. No free intraperitoneal air.   Electronically Signed   By: Delbert Phenix M.D.   On: 2014/11/10 09:30       Assessment / Plan:    #1 24 yo female with hypercoaguable disorder- with new PV thrombus and right colon ischemic injury this admit She is stable, and improved, though depressed  Ok for discharge- advised to push liquids, drink protein shakes etc if not feeling like eating  Asks for rx for Zofran 4 mg ODT for discharge Says nothing tastes right- may have oral thrush- would also give rx for mycostatin oral suspension 5 cc swish and swallow QID x 10 days. Principal Problem:   Pneumatosis of intestines Active Problems:   Current smoker   Anemia, iron deficiency   Seizure   Methadone maintenance therapy patient   Portal vein thrombosis   Abdominal pain, generalized     LOS: 8 days   Afua Hoots  10/16/2014, 8:37 AM

## 2014-10-16 NOTE — Discharge Summary (Signed)
Victoria Alvarez, is a 24 y.o. female  DOB 1991/03/01  MRN [id number].  Admission date:  10/07/2014  Admitting Physician  Theodis Blaze, MD  Discharge Date:  10/16/2014   Primary MD  Minerva Ends, MD  Recommendations for primary care physician for things to follow:   Monitor Eliquis compliance closely, taper off methadone patient motivated to do so.  Check CBC, CMP next visit   Admission Diagnosis  Pneumatosis of intestines [K63.89]   Discharge Diagnosis  Pneumatosis of intestines [K63.89]    Principal Problem:   Pneumatosis of intestines Active Problems:   Current smoker   Anemia, iron deficiency   Seizure   Methadone maintenance therapy patient   Portal vein thrombosis   Abdominal pain, generalized      Past Medical History  Diagnosis Date  . Interstitial cystitis     pain from this was reason for starting opioids age 70.   . Chronic pelvic pain in female   . Headache(784.0)   . Pyelonephritis affecting pregnancy in first trimester July 2015  . Depression 2007  . Opioid dependence     to Rx opioids.  switched to Methadone after second child born in 04/2014  . Pneumonia 03/2014  . Seizures   . Stroke 05/2014    presented with right sided weakness.   . Numbness     right side  . Dizziness 07/18/14  . Confusion   . 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 Preeclampsia 2012  . Miscarriage 2005    Past Surgical History  Procedure Laterality Date  . Vagina surgery  ~ 2011    benign vaginal tumor removed at [facility].   . Cesarean section N/A 05/06/2014    Procedure: CESAREAN SECTION;  Surgeon: Truett Mainland, DO;  Location: [facility];  Service: Obstetrics;  Laterality: N/A;  . Inguinal hernia repair Left 2008       HPI :    24 year old white female with history of cerebral venous sinus thrombosis  on Eliquis presented to the hospital with complaints of acute abdominal pain. Initial CT scan showed pneumatosis along with constipation, after providing supportive care/laxatives- she still continued to have abdominal pain. Repeat CT scan abdomen on 8/2 showed left portal vein thrombosis. Patient was subsequently started on IV heparin. Hospital course has been marked by slow improvement and persistent abdominal pain. Diet has been slowly advanced, general surgery and GI continue to follow. Plans are to follow closely with serial exams, and to perform a CT scan of the abdomen if no improvement.     Hospital Course:     Abd pain: Initially felt to be from constipation and pneumatosis intestinalis, however since patient continued to have persistent pain inspite of treatment of constipation, repeat CT done on 8/2 now shows left portal venous thrombosis.  She has known hypercoagulable state with previous cerebral venous sinus thrombosis, she was supposed to be on Eliquis but remains noncompliant. Her portal vein thrombosis was most likely due to the same. She was seen here by vascular  surgery, GI and general surgery. She was also seen by hematology. She was maintained on IV heparin. She received bowel rest and IV fluids along with supportive care for pain and nausea. She has gradually improved and today is completely symptom-free. Tolerated a regular diet last night. Had breakfast this morning and is now eager to go home. Will be replaced on Eliquis. Case management requested to provide one-month supply to the patient thereafter she will follow with PCP. Has been counseled on compliance. Will follow with GI along with vascular surgery and hematology postdischarge.    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 Hypercoagulable workup  ordered. Requested to follow with hematology postdischarge appointment has been set. Will likely require lifelong anticoagulation.   History of seizure: Continue with Keppra   Chronic Constipation:continue Linzess and MiraLAX   Anemia: No evidence of overt blood loss, suspect multifactorial from secondary to acute illness and chronic blood loss during menses. Given IV Iron during this hospitalization. Follow CBC by PCP.   Chronic pain/history of interstitial cystitis: Continue with methadone. Patient automated to come off of methadone. Request PCP to gradually taper off.      Discharge Condition: Stable  Follow UP  Follow-up Information    Follow up with Minerva Ends, MD On 10/21/2014.   Specialty:  Family Medicine   Why:  9:15 am for hospital follow up   Contact information:   [place] [address] [phone]       Follow up with [facility], NI, MD On 11/01/2014.   Specialty:  Hematology and Oncology   Why:  appoitment for post hospital folllow up at 10 am.    Contact information:   [place] [address] [phone]       Follow up with Deitra Mayo, MD. Schedule an appointment as soon as possible for a visit in 1 week.   Specialties:  Vascular Surgery, Cardiology   Why:  Portal vein thrombosis   Contact information:   [address] [place] [address] [phone]        Consults obtained - GI, vascular surgery, hematology, Gen. surgery  Diet and Activity recommendation: See Discharge Instructions below  Discharge Instructions       Discharge Instructions    Discharge instructions    Complete by:  As directed   Follow with Primary MD Minerva Ends, MD in 7 days   Get CBC, CMP, 2 view Chest X ray checked  by Primary MD next visit.    Activity: As tolerated with Full fall precautions use walker/cane & assistance as needed   Disposition Home     Diet: Heart Healthy soft diet.  For Heart  failure patients - Check your Weight same time everyday, if you gain over 2 pounds, or you develop in leg swelling, experience more shortness of breath or chest pain, call your Primary MD immediately. Follow Cardiac Low Salt Diet and 1.5 lit/day fluid restriction.   On your next visit with your primary care physician please Get Medicines reviewed and adjusted.   Please request your Prim.MD to go over all Hospital Tests and Procedure/Radiological results at the follow up, please get all Hospital records sent to your Prim MD by signing hospital release before you go home.   If you experience worsening of your admission symptoms, develop shortness of  breath, life threatening emergency, suicidal or homicidal thoughts you must seek medical attention immediately by calling 911 or calling your MD immediately  if symptoms less severe.  You Must read complete instructions/literature along with all the possible adverse reactions/side effects for all the Medicines you take and that have been prescribed to you. Take any new Medicines after you have completely understood and accpet all the possible adverse reactions/side effects.   Do not drive, operating heavy machinery, perform activities at heights, swimming or participation in water activities or provide baby sitting services if your were admitted for syncope or siezures until you have seen by Primary MD or a Neurologist and advised to do so again.  Do not drive when taking Pain medications.    Do not take more than prescribed Pain, Sleep and Anxiety Medications  Special Instructions: If you have smoked or chewed Tobacco  in the last 2 yrs please stop smoking, stop any regular Alcohol  and or any Recreational drug use.  Wear Seat belts while driving.   Please note  You were cared for by a hospitalist during your hospital stay. If you have any questions about your discharge medications or the care you received while you were in the hospital after  you are discharged, you can call the unit and asked to speak with the hospitalist on call if the hospitalist that took care of you is not available. Once you are discharged, your primary care physician will handle any further medical issues. Please note that NO REFILLS for any discharge medications will be authorized once you are discharged, as it is imperative that you return to your primary care physician (or establish a relationship with a primary care physician if you do not have one) for your aftercare needs so that they can reassess your need for medications and monitor your lab values.     Increase activity slowly    Complete by:  As directed              Discharge Medications       Medication List    TAKE these medications        apixaban 5 MG Tabs tablet  Commonly known as:  ELIQUIS  Take 1 tablet (5 mg total) by mouth 2 (two) times daily.     docusate sodium 100 MG capsule  Commonly known as:  COLACE  Take 1 capsule (100 mg total) by mouth 2 (two) times daily.     EPINEPHrine 0.3 mg/0.3 mL Soaj injection  Commonly known as:  EPI-PEN  Inject 0.3 mLs (0.3 mg total) into the muscle once.     FLUoxetine 20 MG capsule  Commonly known as:  PROZAC  Take 1 capsule (20 mg total) by mouth daily.     levETIRAcetam 500 MG tablet  Commonly known as:  KEPPRA  Take 1 tablet (500 mg total) by mouth 2 (two) times daily.     methadone 5 MG tablet  Commonly known as:  DOLOPHINE  Take 17 tablets (85 mg total) by mouth daily. Home medication continued     ondansetron 4 MG tablet  Commonly known as:  ZOFRAN  Take 1 tablet (4 mg total) by mouth every 6 (six) hours as needed for nausea.     polyethylene glycol packet  Commonly known as:  MIRALAX / GLYCOLAX  Take 17 g by mouth 2 (two) times daily as needed for moderate constipation.        Major procedures and Radiology Reports - PLEASE  review detailed and final reports for all details, in brief -       Ct Abdomen Pelvis Wo  Contrast  10/08/2014   CLINICAL DATA:  Generalized abdominal pain, greatest in the left lower quadrant.  EXAM: CT ABDOMEN AND PELVIS WITHOUT CONTRAST  TECHNIQUE: Multidetector CT imaging of the abdomen and pelvis was performed following the standard protocol without IV contrast.  COMPARISON:  07/17/2013  FINDINGS: There is pneumatosis of the cecum and ascending colon with a generous volume of air in the wall. There also is a little air which appears to extend toward the undersurface of the gallbladder and liver, perhaps in the hepatoduodenal ligament. There is a small bubble of air in the porta hepatis which may reside within the portal vein. No frank free intraperitoneal air is evident. Despite the rather generous volume of pneumatosis, there is no significant inflammatory change about the cecum or ascending colon. There is no bowel obstruction. Appendix is normal.  There are unremarkable unenhanced appearances of the liver, spleen, pancreas, adrenals and kidneys. Uterus and ovaries appear normal. There is no significant abnormality in the lower chest. There is no significant musculoskeletal abnormality.  IMPRESSION: Pneumatosis of the cecum and ascending colon. There are a few bubbles of air extending up toward the under surface of the gallbladder but the air does not appear to be free in the peritoneum. There also may be a bubble of air in the portal vein. Despite the generous volume of extraluminal air, there is little or no inflammatory change. There is no obstruction. There is no ascites. These results were called by telephone at the time of interpretation on 10/08/2014 at 2:51 am to Dr. Dina Rich, who verbally acknowledged these results.   Electronically Signed   By: Andreas Newport M.D.   On: 10/08/2014 02:57   Dg Chest 2 View  10/06/2014   CLINICAL DATA:  Abdominal pain and shortness of breath for 5 months.  EXAM: CHEST  2 VIEW  COMPARISON:  Chest radiograph 06/12/2014  FINDINGS: The heart is normal in  size. Mild diffuse bronchial thickening. Pulmonary vasculature is normal. No consolidation, pleural effusion, or pneumothorax. No acute osseous abnormalities are seen.  IMPRESSION: Mild diffuse bronchial thickening, can be seen with asthma or bronchitis.   Electronically Signed   By: Jeb Levering M.D.   On: 10/06/2014 21:29   Ct Abdomen Pelvis W Contrast  10/11/2014   CLINICAL DATA:  Abdominal pain. History of hernia repair, pyelonephritis, seizures, CVA and substance abuse. Patient discontinued Eliquis therapy prior to admission. Initial encounter.  EXAM: CT ABDOMEN AND PELVIS WITH CONTRAST  TECHNIQUE: Multidetector CT imaging of the abdomen and pelvis was performed using the standard protocol following bolus administration of intravenous contrast.  CONTRAST:  100 ml Omnipaque 300.  COMPARISON:  Abdominal CT 10/08/2014 and 07/17/2013  FINDINGS: Lower chest: Increased volume loss, ground-glass opacity and linear densities within the right middle and lower lobes may reflect atelectasis or infection. Small right pleural effusion.  Hepatobiliary: There is left portal vein thrombosis with scattered low-density throughout the left hepatic lobe. No suspicious hepatic lesions demonstrated. The right and main portal veins are patent. No evidence of gallstones, gallbladder wall thickening or biliary dilatation.  Pancreas: Unremarkable. No pancreatic ductal dilatation or surrounding inflammatory changes.  Spleen: Normal in size without focal abnormality.  Adrenals/Urinary Tract: Both adrenal glands appear normal.The kidneys appear normal without evidence of urinary tract calculus, suspicious lesion or hydronephrosis. No bladder abnormalities are seen.  Stomach/Bowel: The stomach and proximal  small bowel appear normal. There is distal small bowel wall thickening with mild surrounding inflammatory change. There is also mild wall thickening of the cecum. No residual pneumatosis identified.The appendix appears normal. There  is small amount of free fluid in the right upper pelvis.  Vascular/Lymphatic: There are no enlarged abdominal or pelvic lymph nodes. As above, there is left portal vein thrombosis. The right and main portal veins are patent. The superior mesenteric and splenic veins are patent. No significant arterial findings.  Reproductive: Unremarkable.  Other: Postsurgical changes in the left groin without evidence of recurrent hernia.  Musculoskeletal: No acute or significant osseous findings.  IMPRESSION: 1. Occlusive left portal vein thrombosis, likely secondary to discontinued anti coagulation. The main portal, superior mesenteric and splenic veins are patent. 2. Mild wall thickening of the terminal ileum and cecum with surrounding inflammation, possibly secondary to infectious enteritis, vasculitis or venous thrombosis. No evidence of arterial occlusion, perforation or abscess. 3. Increased predominate linear opacity at the right lung base, likely atelectasis. 4. These results were called by telephone at the time of interpretation on 10/11/2014 at 2:00 pm to Dr. Lala Lund , who verbally acknowledged these results.   Electronically Signed   By: Richardean Sale M.D.   On: 10/11/2014 14:04   Dg Abd 2 Views  10/14/2014   CLINICAL DATA:  24 year old female with severe mid abdominal pain. Reported normal bowel movements.  EXAM: ABDOMEN - 2 VIEW  COMPARISON:  10/13/2014 abdominal radiographs.  FINDINGS: Multiple surgical clips overlie the left lower pelvis. No residual air-fluid levels are seen in the abdomen. There is minimal gas in the small bowel, with no dilated small bowel loops. There is normal colonic gas, with no appreciable stool. No pneumatosis, pneumoperitoneum or pathologic soft tissue calcifications. Visualized osseous structures appear intact.  IMPRESSION: Nonobstructive bowel gas pattern. Resolved air-fluid levels. No free intraperitoneal air.   Electronically Signed   By: Ilona Sorrel M.D.   On: 10/14/2014  09:30   Dg Abd 2 Views  10/13/2014   CLINICAL DATA:  Abdominal pain. History of left portal vein thrombosis  EXAM: ABDOMEN - 2 VIEW  COMPARISON:  CT abdomen and pelvis October 11, 2014  FINDINGS: Supine and upright abdomen images were obtained. There is a paucity of small bowel gas. Colon is not dilated. There are multiple air-fluid levels on the upright image. No free air. There is postoperative change in the left pelvis. There are phleboliths in left pelvis is well. The lung bases are clear.  IMPRESSION: Multiple air-fluid levels. Paucity of small bowel gas. These findings suggest enteritis or a degree of ileus. Obstruction is possible but felt to be somewhat less likely. No free air apparent.   Electronically Signed   By: Lowella Grip III M.D.   On: 10/13/2014 13:46   Dg Abd 2 Views  10/09/2014   CLINICAL DATA:  Generalized abdominal pain. Nausea vomiting diarrhea for 2 days  EXAM: ABDOMEN - 2 VIEW  COMPARISON:  CT 10/06/2014, radiograph 10/06/2014  FINDINGS: The oral contrast from CT of 10/06/2014 has progressed into the rectum. There is mild decrease in the volume of stool seen on comparison exam. No dilated loops of large or small bowel. NO intraoperative free air.  IMPRESSION: No evidence of bowel obstruction. Progression of oral contrast the rectum from CT of 10/06/2014.   Electronically Signed   By: Suzy Bouchard M.D.   On: 10/09/2014 09:33   Dg Abd 2 Views  10/06/2014   CLINICAL DATA:  Abdominal  pain and diarrhea for 5 months.  EXAM: ABDOMEN - 2 VIEW  COMPARISON:  07/13/2014  FINDINGS: There is no free intra-abdominal air. No dilated bowel loops to suggest obstruction. Moderate volume of stool throughout the colon. No radiopaque calculi. Pelvic phleboliths and surgical clips projecting over the left inguinal region, unchanged. There is chronic widening of the pubic symphysis. No acute osseous abnormalities are seen.  IMPRESSION: Normal abdominal radiographs with moderate volume of colonic  stool.   Electronically Signed   By: Jeb Levering M.D.   On: 10/06/2014 21:32   Dg Abd Portable 1v  10/11/2014   CLINICAL DATA:  Generalized abdominal pain, nausea, vomiting and constipation, over the past 4 days. Initial encounter.  EXAM: PORTABLE ABDOMEN - 1 VIEW  COMPARISON:  Abdominal radiograph performed 10/09/2014  FINDINGS: The colon contains a small amount of air but is otherwise empty. No bowel dilatation is seen to suggest obstruction. No free intra-abdominal air is seen, though evaluation for free air is limited on a single supine view.  No acute osseous abnormalities are identified.  IMPRESSION: Colon contains a small amount of air but is otherwise empty. No evidence for bowel obstruction. No radiographic evidence to suggest constipation.   Electronically Signed   By: Garald Balding M.D.   On: 10/11/2014 00:36    Micro Results      Recent Results (from the past 240 hour(s))  Culture, blood (routine x 2)     Status: None (Preliminary result)   Collection Time: 10/13/14  6:55 PM  Result Value Ref Range Status   Specimen Description BLOOD RIGHT FOREARM  Final   Special Requests BOTTLES DRAWN AEROBIC AND ANAEROBIC 4.5CC EACH  Final   Culture NO GROWTH 2 DAYS  Final   Report Status PENDING  Incomplete  Culture, blood (routine x 2)     Status: None (Preliminary result)   Collection Time: 10/13/14  7:06 PM  Result Value Ref Range Status   Specimen Description BLOOD LEFT ARM  Final   Special Requests IN PEDIATRIC BOTTLE [place]  Final   Culture NO GROWTH 2 DAYS  Final   Report Status PENDING  Incomplete    Today   Subjective    Victoria Alvarez today has no headache,no chest abdominal pain,no new weakness tingling or numbness, feels much better wants to go home today.     Objective   Blood pressure 108/63, pulse 52, temperature 98.3 F (36.8 C), temperature source Oral, resp. rate 18, height 5' 2"  (1.575 m), weight 77.111 kg (170 lb), last menstrual period 10/03/2014, SpO2 99 %,  not currently breastfeeding.   Intake/Output Summary (Last 24 hours) at 10/16/14 0940 Last data filed at 10/16/14 0500  Gross per 24 hour  Intake 1527.5 ml  Output   1425 ml  Net  102.5 ml    Exam Awake Alert, Oriented x 3, No new F.N deficits, Normal affect [place].AT,PERRAL Supple Neck,No JVD, No cervical lymphadenopathy appriciated.  Symmetrical Chest wall movement, Good air movement bilaterally, CTAB RRR,No Gallops,Rubs or new Murmurs, No Parasternal Heave +ve B.Sounds, Abd Soft, Non tender, No organomegaly appriciated, No rebound -guarding or rigidity. No Cyanosis, Clubbing or edema, No new Rash or bruise   Data Review   CBC w Diff: Lab Results  Component Value Date   WBC 7.4 10/16/2014   HGB 9.2* 10/16/2014   HCT 31.3* 10/16/2014   PLT 215 10/16/2014   LYMPHOPCT 14 10/07/2014   MONOPCT 4 10/07/2014   EOSPCT 0 10/07/2014   BASOPCT 0  10/07/2014    CMP: Lab Results  Component Value Date   NA 140 10/15/2014   K 3.6 10/15/2014   CL 106 10/15/2014   CO2 28 10/15/2014   BUN <5* 10/15/2014   CREATININE 0.72 10/15/2014   CREATININE 0.81 07/12/2014   PROT 4.8* 10/12/2014   ALBUMIN 2.8* 10/12/2014   BILITOT 0.4 10/12/2014   ALKPHOS 41 10/12/2014   AST 16 10/12/2014   ALT 26 10/12/2014  .   Total Time in preparing paper work, data evaluation and todays exam - 35 minutes  Thurnell Lose M.D on 10/16/2014 at 9:40 AM  [facility]   Office  [phone]

## 2014-10-16 NOTE — Progress Notes (Signed)
GS Progress Note Subjective: Tolerating diet well, pain much improved  Objective: Vital signs in last 24 hours: Temp:  [98.3 F (36.8 C)-99.2 F (37.3 C)] 98.3 F (36.8 C) (08/07 0600) Pulse Rate:  [52-63] 52 (08/07 0600) Resp:  [18] 18 (08/07 0600) BP: (102-108)/(46-63) 108/63 mmHg (08/07 0600) SpO2:  [98 %-100 %] 99 % (08/07 0600) Last BM Date: 10/14/14  Intake/Output from previous day: 08/06 0701 - 08/07 0700 In: 1527.5 [P.O.:320; I.V.:1207.5] Out: 1425 [Urine:1425] Intake/Output this shift:    Lungs: CTAB  Abd: soft, min tenderness  Extremities: no edema  Neuro: AOx4  Lab Results: CBC   Recent Labs  10/15/14 0448 10/16/14 0514  WBC 6.8 7.4  HGB 8.8* 9.2*  HCT 30.2* 31.3*  PLT 201 215   BMET  Recent Labs  10/14/14 0525 10/15/14 0448  NA 137 140  K 3.8 3.6  CL 103 106  CO2 25 28  GLUCOSE 97 94  BUN <5* <5*  CREATININE 0.70 0.72  CALCIUM 8.5* 8.4*   PT/INR No results for input(s): LABPROT, INR in the last 72 hours. ABG No results for input(s): PHART, HCO3 in the last 72 hours.  Invalid input(s): PCO2, PO2  Studies/Results: No results found.  Anti-infectives: Anti-infectives    Start     Dose/Rate Route Frequency Ordered Stop   10/08/14 0645  piperacillin-tazobactam (ZOSYN) IVPB 3.375 g     3.375 g 100 mL/hr over 30 Minutes Intravenous  Once 10/08/14 0630 10/08/14 0737      Medicaions: Scheduled Meds: . apixaban  5 mg Oral BID  . FLUoxetine  20 mg Oral Daily  . levETIRAcetam  500 mg Oral BID  . Linaclotide  145 mcg Oral Daily  . magnesium hydroxide  30 mL Oral Daily  . methadone  85 mg Oral Daily  . nicotine  21 mg Transdermal Daily  . ondansetron  4 mg Oral Q12H  . polyethylene glycol  17 g Oral Daily   Continuous Infusions: . sodium chloride 100 mL/hr at 10/16/14 0117   PRN Meds:.acetaminophen **OR** acetaminophen, alum & mag hydroxide-simeth, metoCLOPramide (REGLAN) injection, morphine injection, ondansetron **OR**  ondansetron (ZOFRAN) IV  Assessment/Plan: s/p  Pain continues to improve Anticoagulation converted to oral medication Follow up with [facility] as needed  LOS: 8 days    [place] Surgeon [phone] Surgery 10/16/2014

## 2014-10-16 NOTE — Progress Notes (Signed)
ANTICOAGULATION CONSULT NOTE - Follow Up Consult  Pharmacy Consult for Heparin>>>>Apixaban  Indication: Portal Vein Thrombosis  Allergies  Allergen Reactions  . Ativan [Lorazepam] Other (See Comments)    " I don't remember anything."  . Bee Venom Swelling  . Ciprofloxacin Diarrhea and Nausea And Vomiting   Patient Measurements: Height:  (157.5 cm) Weight: 170 lb (77.111 kg) IBW/kg (Calculated) : 50.1  Vital Signs: Temp: 98.3 F (36.8 C) (08/07 0600) Temp Source: Oral (08/07 0600) BP: 108/63 mmHg (08/07 0600) Pulse Rate: 52 (08/07 0600)  Labs:  Recent Labs  10/13/14 1208  10/14/14 0525 10/15/14 0448 10/16/14 0514  HGB  --   < > 8.8* 8.8* 9.2*  HCT  --   --  30.3* 30.2* 31.3*  PLT  --   --  209 201 215  HEPARINUNFRC  --   --  0.40 0.59 0.63  CREATININE 0.76  --  0.70 0.72  --   < > = values in this interval not displayed.  Estimated Creatinine Clearance: 103.4 mL/min (by C-G formula based on Cr of 0.72).   Assessment: Transitioning back to oral anti-coagulation with Apixaban. Hgb 9.2. Renal function good.   Goal of Therapy:  Monitor platelets by anticoagulation protocol: Yes   Plan:  -Apixaban 5 mg BID -DC heparin drip after giving Apixaban -Minimum q72h CBC while inpatient -Monitor for bleeding  Victoria Alvarez 10/16/2014,7:17 AM

## 2014-10-16 NOTE — Care Management Note (Signed)
Case Management Note  Patient Details  Name: Kersha Hainer MRN: [id number] Date of Birth: Sep 15, 1990  Subjective/Objective:                   Pneumatosis of intestines Action/Plan:  Discharge planning Expected Discharge Date:  10/16/14               Expected Discharge Plan:  Home/Self Care  In-House Referral:     Discharge planning Services  CM Consult, Medication Assistance  Post Acute Care Choice:    Choice offered to:     DME Arranged:    DME Agency:     HH Arranged:    HH Agency:     Status of Service:  Completed, signed off  Medicare Important Message Given:    Date Medicare IM Given:    Medicare IM give by:    Date Additional Medicare IM Given:    Additional Medicare Important Message give by:     If discussed at Long Length of Stay Meetings, dates discussed:    Additional Comments: CM gave pt Eliquis free 30 day trail card and pt verbalized understanding this will pay for her discharge prescription and give her insurance enough time to authorize the medication so her refills will be covered by her insurance.  No other CM needs were communicated. Yves Dill, RN 10/16/2014, 10:57 AM

## 2014-10-17 LAB — JAK2 GENOTYPR

## 2014-10-18 LAB — FACTOR 5 LEIDEN

## 2014-10-18 LAB — CULTURE, BLOOD (ROUTINE X 2)
Culture: NO GROWTH
Culture: NO GROWTH

## 2014-10-18 LAB — PROTHROMBIN GENE MUTATION

## 2014-10-19 ENCOUNTER — Other Ambulatory Visit: Payer: Self-pay | Admitting: Internal Medicine

## 2014-10-19 LAB — MISCELLANEOUS TEST

## 2014-10-21 ENCOUNTER — Other Ambulatory Visit: Payer: Self-pay

## 2014-10-21 ENCOUNTER — Inpatient Hospital Stay: Payer: Medicaid Other | Admitting: Family Medicine

## 2014-10-25 ENCOUNTER — Telehealth: Payer: Self-pay

## 2014-10-25 ENCOUNTER — Encounter: Payer: Self-pay | Admitting: Vascular Surgery

## 2014-10-25 NOTE — Telephone Encounter (Signed)
Nurse called patient, reached voicemail. Left message for patient to call Anaclara Acklin with [facility], at [phone]. Patient had requested Keppra to be refilled.  Nurse sent Dr. Armen Pickup message.  Dr. Armen Pickup refused to fill medication and requested patient to send refill request to neurology.

## 2014-10-26 ENCOUNTER — Other Ambulatory Visit: Payer: Self-pay | Admitting: Neurology

## 2014-10-26 ENCOUNTER — Ambulatory Visit: Payer: Medicaid Other | Admitting: Vascular Surgery

## 2014-10-28 ENCOUNTER — Other Ambulatory Visit: Payer: Self-pay | Admitting: Neurology

## 2014-10-31 ENCOUNTER — Other Ambulatory Visit: Payer: Self-pay | Admitting: Hematology and Oncology

## 2014-10-31 DIAGNOSIS — D509 Iron deficiency anemia, unspecified: Secondary | ICD-10-CM

## 2014-10-31 DIAGNOSIS — K529 Noninfective gastroenteritis and colitis, unspecified: Secondary | ICD-10-CM

## 2014-11-01 ENCOUNTER — Ambulatory Visit: Payer: Medicaid Other | Admitting: Hematology and Oncology

## 2014-11-01 ENCOUNTER — Other Ambulatory Visit: Payer: Medicaid Other

## 2014-11-01 ENCOUNTER — Encounter: Payer: Self-pay | Admitting: Hematology and Oncology

## 2014-11-01 ENCOUNTER — Ambulatory Visit: Payer: Medicaid Other

## 2014-11-02 ENCOUNTER — Encounter (HOSPITAL_COMMUNITY): Payer: Self-pay | Admitting: Adult Health

## 2014-11-02 ENCOUNTER — Inpatient Hospital Stay (HOSPITAL_COMMUNITY)
Admission: EM | Admit: 2014-11-02 | Discharge: 2014-11-06 | DRG: 392 | Disposition: A | Discharge status: Home / Self Care | Payer: Medicaid Other | Attending: Internal Medicine | Admitting: Internal Medicine

## 2014-11-02 ENCOUNTER — Emergency Department (HOSPITAL_COMMUNITY): Payer: Medicaid Other

## 2014-11-02 DIAGNOSIS — F329 Major depressive disorder, single episode, unspecified: Secondary | ICD-10-CM | POA: Diagnosis present

## 2014-11-02 DIAGNOSIS — G40909 Epilepsy, unspecified, not intractable, without status epilepticus: Secondary | ICD-10-CM | POA: Diagnosis present

## 2014-11-02 DIAGNOSIS — Z720 Tobacco use: Secondary | ICD-10-CM | POA: Diagnosis present

## 2014-11-02 DIAGNOSIS — R109 Unspecified abdominal pain: Secondary | ICD-10-CM | POA: Diagnosis present

## 2014-11-02 DIAGNOSIS — Z79899 Other long term (current) drug therapy: Secondary | ICD-10-CM

## 2014-11-02 DIAGNOSIS — I81 Portal vein thrombosis: Secondary | ICD-10-CM | POA: Diagnosis present

## 2014-11-02 DIAGNOSIS — F17210 Nicotine dependence, cigarettes, uncomplicated: Secondary | ICD-10-CM | POA: Diagnosis present

## 2014-11-02 DIAGNOSIS — K5901 Slow transit constipation: Secondary | ICD-10-CM | POA: Insufficient documentation

## 2014-11-02 DIAGNOSIS — Z7901 Long term (current) use of anticoagulants: Secondary | ICD-10-CM

## 2014-11-02 DIAGNOSIS — R569 Unspecified convulsions: Secondary | ICD-10-CM

## 2014-11-02 DIAGNOSIS — Z8673 Personal history of transient ischemic attack (TIA), and cerebral infarction without residual deficits: Secondary | ICD-10-CM

## 2014-11-02 DIAGNOSIS — Z79891 Long term (current) use of opiate analgesic: Secondary | ICD-10-CM

## 2014-11-02 DIAGNOSIS — N3010 Interstitial cystitis (chronic) without hematuria: Secondary | ICD-10-CM | POA: Diagnosis present

## 2014-11-02 DIAGNOSIS — F32A Depression, unspecified: Secondary | ICD-10-CM | POA: Diagnosis present

## 2014-11-02 DIAGNOSIS — G08 Intracranial and intraspinal phlebitis and thrombophlebitis: Secondary | ICD-10-CM

## 2014-11-02 DIAGNOSIS — F1120 Opioid dependence, uncomplicated: Secondary | ICD-10-CM | POA: Diagnosis present

## 2014-11-02 DIAGNOSIS — Z86718 Personal history of other venous thrombosis and embolism: Secondary | ICD-10-CM

## 2014-11-02 DIAGNOSIS — K529 Noninfective gastroenteritis and colitis, unspecified: Principal | ICD-10-CM | POA: Diagnosis present

## 2014-11-02 HISTORY — DX: Patient's noncompliance with other medical treatment and regimen due to unspecified reason: Z91.199

## 2014-11-02 HISTORY — DX: Patient's noncompliance with other medical treatment and regimen: Z91.19

## 2014-11-02 HISTORY — DX: Other specified diseases of intestine: K63.89

## 2014-11-02 HISTORY — DX: Intracranial and intraspinal phlebitis and thrombophlebitis: G08

## 2014-11-02 LAB — URINALYSIS, ROUTINE W REFLEX MICROSCOPIC
Bilirubin Urine: NEGATIVE
Glucose, UA: NEGATIVE mg/dL
Hgb urine dipstick: NEGATIVE
Ketones, ur: NEGATIVE mg/dL
Leukocytes, UA: NEGATIVE
Nitrite: NEGATIVE
Protein, ur: NEGATIVE mg/dL
Specific Gravity, Urine: 1.016 (ref 1.005–1.030)
Urobilinogen, UA: 0.2 mg/dL (ref 0.0–1.0)
pH: 5 (ref 5.0–8.0)

## 2014-11-02 LAB — COMPREHENSIVE METABOLIC PANEL
ALT: 25 U/L (ref 14–54)
AST: 21 U/L (ref 15–41)
Albumin: 3.1 g/dL — ABNORMAL LOW (ref 3.5–5.0)
Alkaline Phosphatase: 47 U/L (ref 38–126)
Anion gap: 7 (ref 5–15)
BUN: 14 mg/dL (ref 6–20)
CO2: 24 mmol/L (ref 22–32)
Calcium: 8.8 mg/dL — ABNORMAL LOW (ref 8.9–10.3)
Chloride: 105 mmol/L (ref 101–111)
Creatinine, Ser: 0.83 mg/dL (ref 0.44–1.00)
GFR calc Af Amer: >60 mL/min (ref >60)
GFR calc non Af Amer: >60 mL/min (ref >60)
Glucose, Bld: 90 mg/dL (ref 65–99)
Potassium: 3.9 mmol/L (ref 3.5–5.1)
Sodium: 136 mmol/L (ref 135–145)
Total Bilirubin: 0.5 mg/dL (ref 0.3–1.2)
Total Protein: 5 g/dL — ABNORMAL LOW (ref 6.5–8.1)

## 2014-11-02 LAB — CBC
HCT: 42.5 % (ref 36.0–46.0)
Hemoglobin: 13.3 g/dL (ref 12.0–15.0)
MCH: 22.5 pg — ABNORMAL LOW (ref 26.0–34.0)
MCHC: 31.3 g/dL (ref 30.0–36.0)
MCV: 71.9 fL — ABNORMAL LOW (ref 78.0–100.0)
Platelets: 222 10*3/uL (ref 150–400)
RBC: 5.91 MIL/uL — ABNORMAL HIGH (ref 3.87–5.11)
RDW: 28.6 % — ABNORMAL HIGH (ref 11.5–15.5)
WBC: 6.9 10*3/uL (ref 4.0–10.5)

## 2014-11-02 LAB — LIPASE, BLOOD: Lipase: 33 U/L (ref 22–51)

## 2014-11-02 LAB — POC URINE PREG, ED: Preg Test, Ur: NEGATIVE

## 2014-11-02 MED ORDER — FENTANYL CITRATE (PF) 100 MCG/2ML IJ SOLN
100.0000 ug | INTRAMUSCULAR | Status: AC | PRN
  Administered 2014-11-02 – 2014-11-03 (×2): 100 ug via INTRAVENOUS
  Filled 2014-11-02 (×2): qty 2

## 2014-11-02 MED ORDER — SODIUM CHLORIDE 0.9 % IV SOLN
INTRAVENOUS | Status: DC
  Administered 2014-11-02 – 2014-11-03 (×2): via INTRAVENOUS
  Administered 2014-11-04: 125 mL/h via INTRAVENOUS
  Administered 2014-11-04 – 2014-11-05 (×3): via INTRAVENOUS

## 2014-11-02 MED ORDER — ONDANSETRON HCL 4 MG/2ML IJ SOLN
4.0000 mg | INTRAMUSCULAR | Status: DC | PRN
  Administered 2014-11-02: 4 mg via INTRAVENOUS
  Filled 2014-11-02: qty 2

## 2014-11-02 MED ORDER — IOHEXOL 300 MG/ML  SOLN
80.0000 mL | Freq: Once | INTRAMUSCULAR | Status: AC | PRN
  Administered 2014-11-02: 100 mL via INTRAVENOUS

## 2014-11-02 MED ORDER — SODIUM CHLORIDE 0.9 % IV BOLUS (SEPSIS): 500.0000 mL | Freq: Once | INTRAVENOUS | Status: DC

## 2014-11-02 MED ORDER — SODIUM CHLORIDE 0.9 % IV BOLUS (SEPSIS)
1000.0000 mL | Freq: Once | INTRAVENOUS | Status: AC
  Administered 2014-11-03: 1000 mL via INTRAVENOUS

## 2014-11-02 NOTE — ED Notes (Signed)
Patient advised urine sample needed.

## 2014-11-02 NOTE — ED Notes (Signed)
Patient has received her home meds from pharmacy at this time.

## 2014-11-02 NOTE — ED Notes (Signed)
Per Dr Clarene Duke, ok for patient to take Eliquis 5mg  po of home meds at this time.

## 2014-11-02 NOTE — ED Notes (Signed)
Presents with with abdominal pain-pt was D/c from [facility] on 8/6 after a 10 day stay for a blood clot in abdomen-"I have had pain in my abdomen, more ion left than right usually since I left the hospital but it usually calms down in 5-10 minutes, today it is worse and the pain is also in my back. I am taking my eloquis as directed." pt is tearful. Denies trouble urinating and pain with urination.

## 2014-11-02 NOTE — ED Notes (Signed)
Patient to CT.

## 2014-11-02 NOTE — ED Notes (Signed)
Patient returned from CT

## 2014-11-02 NOTE — ED Provider Notes (Signed)
CSN: [id number]     Arrival date & time 11/02/14  2032 History   First MD Initiated Contact with Patient 11/02/14 2153     Chief Complaint  Patient presents with  . Abdominal Pain      HPI Pt was seen at 2200.   Per pt, c/o gradual onset and persistence of constant generalized abd "pain" for the past 2 weeks, worse today. Describes the abd pain as "sharp."  States her abd pain "never really went away" since her discharge from the hospital on 10/16/14 for dx portal venous thrombosis and pneumatosis of intestines. Endorses compliance with her Eliquis. Denies vomiting/diarrhea, no fevers, no back pain, no rash, no CP/SOB, no black or blood in stools.       Past Medical History  Diagnosis Date  . Interstitial cystitis     pain from this was reason for starting opioids age 24.   . Chronic pelvic pain in female   . Headache(784.0)   . Pyelonephritis affecting pregnancy in first trimester July 2015  . Depression 2007  . Opioid dependence     to Rx opioids.  switched to Methadone after second child born in 04/2014  . Pneumonia 03/2014  . Seizures   . Stroke 05/2014    presented with right sided weakness.   . Numbness     right side  . Dizziness 07/18/14  . Confusion   . 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 Preeclampsia 2012  . Miscarriage 2005  . Non-compliance     with anticoagulant  . 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 Vagina surgery  ~ 2011    benign vaginal tumor removed at [facility].   . Cesarean section N/A 05/06/2014    Procedure: CESAREAN SECTION;  Surgeon: Truett Mainland, DO;  Location: [facility];  Service: Obstetrics;  Laterality: N/A;  . Inguinal hernia repair Left 2008   Family History  Problem Relation Age of Onset  . Diabetes Mother   . Heart disease Mother   . Hypertension Father   . Diabetes Maternal Grandmother   . Hypertension Maternal Grandmother   . Stroke Maternal Grandmother   . Diabetes Maternal Grandfather   .  Hypertension Maternal Grandfather   . Diabetes Paternal Grandmother   . Diabetes Paternal Grandfather    Social History  Substance Use Topics  . Smoking status: Current Every Day Smoker -- 0.50 packs/day for 8 years    Types: Cigarettes  . Smokeless tobacco: Never Used     Comment: cutting back  . Alcohol Use: No   OB History    Gravida Para Term Preterm AB TAB SAB Ectopic Multiple Living   3 2 2  0 1 0 1 0 0 2     Review of Systems ROS: Statement: All systems negative except as marked or noted in the HPI; Constitutional: Negative for fever and chills. ; ; Eyes: Negative for eye pain, redness and discharge. ; ; ENMT: Negative for ear pain, hoarseness, nasal congestion, sinus pressure and sore throat. ; ; Cardiovascular: Negative for chest pain, palpitations, diaphoresis, dyspnea and peripheral edema. ; ; Respiratory: Negative for cough, wheezing and stridor. ; ; Gastrointestinal: +abd pain. Negative for nausea, vomiting, diarrhea, blood in stool, hematemesis, jaundice and rectal bleeding. . ; ; Genitourinary: Negative for dysuria, flank pain and hematuria. ; ; Musculoskeletal: Negative for back pain and neck pain. Negative for swelling and trauma.; ; Skin: Negative for pruritus, rash, abrasions, blisters, bruising and  skin lesion.; ; Neuro: Negative for headache, lightheadedness and neck stiffness. Negative for weakness, altered level of consciousness , altered mental status, extremity weakness, paresthesias, involuntary movement, seizure and syncope.      Allergies  Ativan; Bee venom; and Ciprofloxacin  Home Medications   Prior to Admission medications   Medication Sig Start Date End Date Taking? Authorizing Provider  apixaban (ELIQUIS) 5 MG TABS tablet Take 1 tablet (5 mg total) by mouth 2 (two) times daily. 10/16/14 02/03/15  Thurnell Lose, MD  docusate sodium (COLACE) 100 MG capsule Take 1 capsule (100 mg total) by mouth 2 (two) times daily. 10/16/14   Thurnell Lose, MD   EPINEPHrine 0.3 mg/0.3 mL IJ SOAJ injection Inject 0.3 mLs (0.3 mg total) into the muscle once. 06/16/14   Arnoldo Morale, MD  FLUoxetine (PROZAC) 20 MG capsule Take 1 capsule (20 mg total) by mouth daily. 07/18/14   Garvin Fila, MD  levETIRAcetam (KEPPRA) 500 MG tablet Take 1 tablet (500 mg total) by mouth 2 (two) times daily. 06/13/14   Reyne Dumas, MD  methadone (DOLOPHINE) 5 MG tablet Take 17 tablets (85 mg total) by mouth daily. Home medication continued 10/16/14   Thurnell Lose, MD  ondansetron (ZOFRAN) 4 MG tablet Take 1 tablet (4 mg total) by mouth every 6 (six) hours as needed for nausea. 10/16/14   Thurnell Lose, MD  polyethylene glycol (MIRALAX / GLYCOLAX) packet Take 17 g by mouth 2 (two) times daily as needed for moderate constipation. 10/16/14   Thurnell Lose, MD   BP 102/48 mmHg  Pulse 82  Temp(Src) 98.2 F (36.8 C) (Oral)  Resp 18  SpO2 98%  LMP 10/03/2014 Physical Exam  2205: Physical examination:  Nursing notes reviewed; Vital signs and O2 SAT reviewed;  Constitutional: Well developed, Well nourished, Well hydrated, Tearful;; Head:  Normocephalic, atraumatic; Eyes: EOMI, PERRL, No scleral icterus; ENMT: Mouth and pharynx normal, Mucous membranes moist; Neck: Supple, Full range of motion, No lymphadenopathy; Cardiovascular: Regular rate and rhythm, No gallop; Respiratory: Breath sounds clear & equal bilaterally, No rales, rhonchi, wheezes.  Speaking full sentences with ease, Normal respiratory effort/excursion; Chest: Nontender, Movement normal; Abdomen: Soft, +diffuse tenderness to palp. Nondistended, Normal bowel sounds; Genitourinary: No CVA tenderness; Extremities: Pulses normal, No tenderness, No edema, No calf edema or asymmetry.; Neuro: AA&Ox3, Major CN grossly intact.  Speech clear. No gross focal motor or sensory deficits in extremities. Climbs on and off stretcher easily by herself. Gait steady.; Skin: Color normal, Warm, Dry.   ED Course  Procedures (including  critical care time)  Labs Review  Imaging Review  I have personally reviewed and evaluated these images and lab results as part of my medical decision-making.   EKG Interpretation None      MDM  MDM Reviewed: vitals, previous chart and nursing note Reviewed previous: labs and CT scan Interpretation: labs      Results for orders placed or performed during the hospital encounter of 11/02/14  Comprehensive metabolic panel  Result Value Ref Range   Sodium 136 135 - 145 mmol/L   Potassium 3.9 3.5 - 5.1 mmol/L   Chloride 105 101 - 111 mmol/L   CO2 24 22 - 32 mmol/L   Glucose, Bld 90 65 - 99 mg/dL   BUN 14 6 - 20 mg/dL   Creatinine, Ser 0.83 0.44 - 1.00 mg/dL   Calcium 8.8 (L) 8.9 - 10.3 mg/dL   Total Protein 5.0 (L) 6.5 - 8.1 g/dL  Albumin 3.1 (L) 3.5 - 5.0 g/dL   AST 21 15 - 41 U/L   ALT 25 14 - 54 U/L   Alkaline Phosphatase 47 38 - 126 U/L   Total Bilirubin 0.5 0.3 - 1.2 mg/dL   GFR calc non Af Amer >60 >60 mL/min   GFR calc Af Amer >60 >60 mL/min   Anion gap 7 5 - 15  CBC  Result Value Ref Range   WBC 6.9 4.0 - 10.5 K/uL   RBC 5.91 (H) 3.87 - 5.11 MIL/uL   Hemoglobin 13.3 12.0 - 15.0 g/dL   HCT 42.5 36.0 - 46.0 %   MCV 71.9 (L) 78.0 - 100.0 fL   MCH 22.5 (L) 26.0 - 34.0 pg   MCHC 31.3 30.0 - 36.0 g/dL   RDW 28.6 (H) 11.5 - 15.5 %   Platelets 222 150 - 400 K/uL  Lipase, blood  Result Value Ref Range   Lipase 33 22 - 51 U/L  POC urine preg, ED (not at [facility])  Result Value Ref Range   Preg Test, Ur NEGATIVE NEGATIVE    2350:  Pt has taken her usual dose of eliquis while in the ED. Udip and CT A/P pending. Sign out to Dr. Lita Mains.   Francine Graven, DO 11/02/14 2355

## 2014-11-03 ENCOUNTER — Inpatient Hospital Stay (HOSPITAL_COMMUNITY): Payer: Medicaid Other

## 2014-11-03 ENCOUNTER — Encounter (HOSPITAL_COMMUNITY): Payer: Self-pay | Admitting: Internal Medicine

## 2014-11-03 DIAGNOSIS — K551 Chronic vascular disorders of intestine: Secondary | ICD-10-CM

## 2014-11-03 DIAGNOSIS — Z8673 Personal history of transient ischemic attack (TIA), and cerebral infarction without residual deficits: Secondary | ICD-10-CM | POA: Diagnosis not present

## 2014-11-03 DIAGNOSIS — G08 Intracranial and intraspinal phlebitis and thrombophlebitis: Secondary | ICD-10-CM

## 2014-11-03 DIAGNOSIS — F329 Major depressive disorder, single episode, unspecified: Secondary | ICD-10-CM | POA: Diagnosis present

## 2014-11-03 DIAGNOSIS — Z86718 Personal history of other venous thrombosis and embolism: Secondary | ICD-10-CM | POA: Diagnosis not present

## 2014-11-03 DIAGNOSIS — R109 Unspecified abdominal pain: Secondary | ICD-10-CM | POA: Diagnosis present

## 2014-11-03 DIAGNOSIS — Z720 Tobacco use: Secondary | ICD-10-CM

## 2014-11-03 DIAGNOSIS — R1032 Left lower quadrant pain: Secondary | ICD-10-CM | POA: Diagnosis not present

## 2014-11-03 DIAGNOSIS — F32A Depression, unspecified: Secondary | ICD-10-CM | POA: Diagnosis present

## 2014-11-03 DIAGNOSIS — R569 Unspecified convulsions: Secondary | ICD-10-CM | POA: Diagnosis not present

## 2014-11-03 DIAGNOSIS — F1120 Opioid dependence, uncomplicated: Secondary | ICD-10-CM | POA: Diagnosis present

## 2014-11-03 DIAGNOSIS — Z7901 Long term (current) use of anticoagulants: Secondary | ICD-10-CM | POA: Diagnosis not present

## 2014-11-03 DIAGNOSIS — K5901 Slow transit constipation: Secondary | ICD-10-CM | POA: Diagnosis present

## 2014-11-03 DIAGNOSIS — G40909 Epilepsy, unspecified, not intractable, without status epilepticus: Secondary | ICD-10-CM | POA: Diagnosis present

## 2014-11-03 DIAGNOSIS — Z79891 Long term (current) use of opiate analgesic: Secondary | ICD-10-CM | POA: Diagnosis not present

## 2014-11-03 DIAGNOSIS — Z79899 Other long term (current) drug therapy: Secondary | ICD-10-CM | POA: Diagnosis not present

## 2014-11-03 DIAGNOSIS — I81 Portal vein thrombosis: Secondary | ICD-10-CM | POA: Diagnosis not present

## 2014-11-03 DIAGNOSIS — R935 Abnormal findings on diagnostic imaging of other abdominal regions, including retroperitoneum: Secondary | ICD-10-CM | POA: Diagnosis not present

## 2014-11-03 DIAGNOSIS — K529 Noninfective gastroenteritis and colitis, unspecified: Secondary | ICD-10-CM | POA: Diagnosis present

## 2014-11-03 DIAGNOSIS — N3010 Interstitial cystitis (chronic) without hematuria: Secondary | ICD-10-CM | POA: Diagnosis present

## 2014-11-03 DIAGNOSIS — F17210 Nicotine dependence, cigarettes, uncomplicated: Secondary | ICD-10-CM | POA: Diagnosis present

## 2014-11-03 LAB — CBC
HCT: 35.1 % — ABNORMAL LOW (ref 36.0–46.0)
Hemoglobin: 10.6 g/dL — ABNORMAL LOW (ref 12.0–15.0)
MCH: 21.8 pg — ABNORMAL LOW (ref 26.0–34.0)
MCHC: 30.2 g/dL (ref 30.0–36.0)
MCV: 72.1 fL — ABNORMAL LOW (ref 78.0–100.0)
Platelets: 198 10*3/uL (ref 150–400)
RBC: 4.87 MIL/uL (ref 3.87–5.11)
RDW: 28.6 % — ABNORMAL HIGH (ref 11.5–15.5)
WBC: 5 10*3/uL (ref 4.0–10.5)

## 2014-11-03 LAB — TYPE AND SCREEN
ABO/RH(D): O NEG
Antibody Screen: NEGATIVE

## 2014-11-03 LAB — LACTIC ACID, PLASMA
Lactic Acid, Venous: 1.2 mmol/L (ref 0.5–2.0)
Lactic Acid, Venous: 1.2 mmol/L (ref 0.5–2.0)

## 2014-11-03 LAB — RAPID URINE DRUG SCREEN, HOSP PERFORMED
Amphetamines: NOT DETECTED
Barbiturates: NOT DETECTED
Benzodiazepines: NOT DETECTED
Cocaine: NOT DETECTED
Opiates: NOT DETECTED
Tetrahydrocannabinol: NOT DETECTED

## 2014-11-03 LAB — GLUCOSE, CAPILLARY: Glucose-Capillary: 78 mg/dL (ref 65–99)

## 2014-11-03 LAB — COMPREHENSIVE METABOLIC PANEL
ALT: 18 U/L (ref 14–54)
AST: 21 U/L (ref 15–41)
Albumin: 2.3 g/dL — ABNORMAL LOW (ref 3.5–5.0)
Alkaline Phosphatase: 37 U/L — ABNORMAL LOW (ref 38–126)
Anion gap: 5 (ref 5–15)
BUN: 12 mg/dL (ref 6–20)
CO2: 21 mmol/L — ABNORMAL LOW (ref 22–32)
Calcium: 7.5 mg/dL — ABNORMAL LOW (ref 8.9–10.3)
Chloride: 111 mmol/L (ref 101–111)
Creatinine, Ser: 0.7 mg/dL (ref 0.44–1.00)
GFR calc Af Amer: >60 mL/min (ref >60)
GFR calc non Af Amer: >60 mL/min (ref >60)
Glucose, Bld: 89 mg/dL (ref 65–99)
Potassium: 4.3 mmol/L (ref 3.5–5.1)
Sodium: 137 mmol/L (ref 135–145)
Total Bilirubin: 0.5 mg/dL (ref 0.3–1.2)
Total Protein: 3.8 g/dL — ABNORMAL LOW (ref 6.5–8.1)

## 2014-11-03 LAB — PROTIME-INR
INR: 1.24 (ref 0.00–1.49)
Prothrombin Time: 15.8 seconds — ABNORMAL HIGH (ref 11.6–15.2)

## 2014-11-03 LAB — APTT: aPTT: 27 seconds (ref 24–37)

## 2014-11-03 LAB — I-STAT CG4 LACTIC ACID, ED: Lactic Acid, Venous: 0.95 mmol/L (ref 0.5–2.0)

## 2014-11-03 MED ORDER — METHADONE HCL 10 MG PO TABS
84.0000 mg | ORAL_TABLET | Freq: Every day | ORAL | Status: DC
  Administered 2014-11-03 – 2014-11-06 (×4): 85 mg via ORAL
  Filled 2014-11-03 (×4): qty 9

## 2014-11-03 MED ORDER — LEVETIRACETAM 500 MG PO TABS
500.0000 mg | ORAL_TABLET | Freq: Two times a day (BID) | ORAL | Status: DC
  Administered 2014-11-03 – 2014-11-06 (×7): 500 mg via ORAL
  Filled 2014-11-03: qty 1
  Filled 2014-11-03: qty 2
  Filled 2014-11-03 (×5): qty 1

## 2014-11-03 MED ORDER — EPINEPHRINE 0.3 MG/0.3ML IJ SOAJ: 0.3000 mg | Freq: Once | INTRAMUSCULAR | Status: DC

## 2014-11-03 MED ORDER — POLYETHYLENE GLYCOL 3350 17 G PO PACK
17.0000 g | PACK | Freq: Two times a day (BID) | ORAL | Status: DC | PRN
  Administered 2014-11-04: 17 g via ORAL
  Filled 2014-11-03: qty 1

## 2014-11-03 MED ORDER — FAMOTIDINE 20 MG PO TABS
20.0000 mg | ORAL_TABLET | Freq: Every day | ORAL | Status: DC
  Administered 2014-11-03 – 2014-11-06 (×4): 20 mg via ORAL
  Filled 2014-11-03 (×4): qty 1

## 2014-11-03 MED ORDER — FLUOXETINE HCL 20 MG PO CAPS
20.0000 mg | ORAL_CAPSULE | Freq: Every day | ORAL | Status: DC
  Administered 2014-11-03 – 2014-11-06 (×4): 20 mg via ORAL
  Filled 2014-11-03 (×4): qty 1

## 2014-11-03 MED ORDER — ACETAMINOPHEN 650 MG RE SUPP
650.0000 mg | Freq: Four times a day (QID) | RECTAL | Status: DC | PRN
Start: 2014-11-03 — End: 2014-11-06

## 2014-11-03 MED ORDER — ACETAMINOPHEN 325 MG PO TABS: 650.0000 mg | ORAL_TABLET | Freq: Four times a day (QID) | ORAL | Status: DC | PRN

## 2014-11-03 MED ORDER — APIXABAN 5 MG PO TABS
5.0000 mg | ORAL_TABLET | Freq: Two times a day (BID) | ORAL | Status: DC
  Administered 2014-11-03 – 2014-11-06 (×7): 5 mg via ORAL
  Filled 2014-11-03 (×7): qty 1

## 2014-11-03 MED ORDER — SODIUM CHLORIDE 0.9 % IJ SOLN
3.0000 mL | Freq: Two times a day (BID) | INTRAMUSCULAR | Status: DC
  Administered 2014-11-03 (×2): 3 mL via INTRAVENOUS
  Administered 2014-11-04: 10 mL via INTRAVENOUS
  Administered 2014-11-04 – 2014-11-05 (×2): 3 mL via INTRAVENOUS

## 2014-11-03 MED ORDER — SODIUM CHLORIDE 0.9 % IV BOLUS (SEPSIS): 1500.0000 mL | Freq: Once | INTRAVENOUS | Status: DC

## 2014-11-03 MED ORDER — ONDANSETRON HCL 4 MG/2ML IJ SOLN
4.0000 mg | Freq: Three times a day (TID) | INTRAMUSCULAR | Status: AC | PRN
  Administered 2014-11-03: 4 mg via INTRAVENOUS
  Filled 2014-11-03: qty 2

## 2014-11-03 MED ORDER — NICOTINE 21 MG/24HR TD PT24
21.0000 mg | MEDICATED_PATCH | Freq: Every day | TRANSDERMAL | Status: DC
  Administered 2014-11-03 – 2014-11-06 (×4): 21 mg via TRANSDERMAL
  Filled 2014-11-03 (×4): qty 1

## 2014-11-03 MED ORDER — FENTANYL CITRATE (PF) 100 MCG/2ML IJ SOLN
50.0000 ug | Freq: Once | INTRAMUSCULAR | Status: AC
  Administered 2014-11-03: 50 ug via INTRAVENOUS
  Filled 2014-11-03: qty 2

## 2014-11-03 MED ORDER — HYDROMORPHONE HCL 1 MG/ML IJ SOLN
1.0000 mg | INTRAMUSCULAR | Status: DC | PRN
  Administered 2014-11-03 – 2014-11-06 (×21): 1 mg via INTRAVENOUS
  Filled 2014-11-03 (×21): qty 1

## 2014-11-03 MED ORDER — SODIUM CHLORIDE 0.9 % IV SOLN
500.0000 mg | Freq: Once | INTRAVENOUS | Status: DC
  Filled 2014-11-03: qty 5

## 2014-11-03 MED ORDER — IOHEXOL 350 MG/ML SOLN
100.0000 mL | Freq: Once | INTRAVENOUS | Status: AC | PRN
  Administered 2014-11-04: 100 mL via INTRAVENOUS

## 2014-11-03 NOTE — Consult Note (Signed)
Consultation  Referring Provider:  [facility]    Primary Care Physician:  Minerva Ends, MD Primary Gastroenterologist: Seen on unassigned late July by Dr. Fuller Plan        Reason for Consultation:  Abdominal pain            HPI:   Victoria Alvarez is a 24 y.o. female with a ? history of a hypercoagulable disorder / CVA in early March following birth of child late Feb of this year . She is on Eliquis at home. We met patient during a hospital stay last month for lower abdominal pain and rectal bleeding.  CTscan suggested pneumatosis of cecum and ascending colon. Pain initially felt to be from constipation but it didn't improve after bowel evacuation.   Repeat CTscan 3 days later revealed cecal and terminal ileal thickening. CTA of abd/pelvis revealed acute PV thrombosis and thickening of distal TI, rule out ischemia. Patient had been off Eliquis for 3 days prior to admission (wasn't taking during menstrual cycle). Hematology evaluated and ordered additional workup as evaluation in March after CVA didn't suggest hypercoagualble state. Patient was treated with heparin then discharged home 10/16/14 on Eliquis. For constipation she was prescribed Linzess and Miralax but is only taking MOM. Constipation became a problem for patient while pregnant with second child several months ago. It was also during that time that she was started on Methadone for pain pill addiction.   Patient came back to ED last night for abdominal pain. Pain started again last week, became progressively worse. Yesterday pain became unbearable. She has had some associated nausea and vomiting. This is same pain that brought her to ED last month. The pain is unrelated to meals or defecation. She hasn't had any bleeding this time. .  Repeat CTscan last night reveals residual thickening of distal ileum.  She also had repeat CTA of abd / pelvis today with findings of increased mesenteric stranding, a long thickened segment of  distal ileum with focal distention just proximal to the area.    Patient complains of burning in chest / reflux  Past Medical History  Diagnosis Date  . Interstitial cystitis     pain from this was reason for starting opioids age 33.   . Chronic pelvic pain in female   . Pyelonephritis affecting pregnancy in first trimester July 2015  . Depression 2007  . Opioid dependence     to Rx opioids.  switched to Methadone after second child born in 04/2014  . Pneumonia 03/2014  . Seizures   . Stroke 05/2014    presented with right sided weakness.   . Numbness     right side  . 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 Preeclampsia 2012  . Miscarriage 2005  . Non-compliance     with anticoagulant  . 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 Cerebral venous sinus thrombosis     Past Surgical History  Procedure Laterality Date  . Vagina surgery  ~ 2011    benign vaginal tumor removed at [facility].   . Cesarean section N/A 05/06/2014    Procedure: CESAREAN SECTION;  Surgeon: Truett Mainland, DO;  Location: [facility];  Service: Obstetrics;  Laterality: N/A;  . Inguinal hernia repair Left 2008    Family History  Problem Relation Age of Onset  . Diabetes Mother   . Heart disease Mother   . Hypertension Father   . Diabetes Maternal Grandmother   . Hypertension Maternal Grandmother   .  Stroke Maternal Grandmother   . Diabetes Maternal Grandfather   . Hypertension Maternal Grandfather   . Diabetes Paternal Grandmother   . Diabetes Paternal Grandfather      Social History  Substance Use Topics  . Smoking status: Current Every Day Smoker -- 0.50 packs/day for 8 years    Types: Cigarettes  . Smokeless tobacco: Never Used     Comment: cutting back  . Alcohol Use: No    Prior to Admission medications   Medication Sig Start Date End Date Taking? Authorizing Provider  apixaban (ELIQUIS) 5 MG TABS tablet Take 1 tablet (5 mg total) by mouth 2 (two) times daily. 10/16/14 02/03/15 Yes Thurnell Lose, MD    docusate sodium (COLACE) 100 MG capsule Take 1 capsule (100 mg total) by mouth 2 (two) times daily. 10/16/14  Yes Thurnell Lose, MD  FLUoxetine (PROZAC) 20 MG capsule Take 1 capsule (20 mg total) by mouth daily. 07/18/14  Yes Garvin Fila, MD  levETIRAcetam (KEPPRA) 500 MG tablet Take 1 tablet (500 mg total) by mouth 2 (two) times daily. 06/13/14  Yes Reyne Dumas, MD  methadone (DOLOPHINE) 5 MG tablet Take 17 tablets (85 mg total) by mouth daily. Home medication continued 10/16/14  Yes Thurnell Lose, MD  ondansetron (ZOFRAN) 4 MG tablet Take 1 tablet (4 mg total) by mouth every 6 (six) hours as needed for nausea. 10/16/14  Yes Thurnell Lose, MD  polyethylene glycol (MIRALAX / GLYCOLAX) packet Take 17 g by mouth 2 (two) times daily as needed for moderate constipation. 10/16/14  Yes Thurnell Lose, MD  EPINEPHrine 0.3 mg/0.3 mL IJ SOAJ injection Inject 0.3 mLs (0.3 mg total) into the muscle once. 06/16/14   Arnoldo Morale, MD    Current Facility-Administered Medications  Medication Dose Route Frequency Provider Last Rate Last Dose  . 0.9 %  sodium chloride infusion   Intravenous Continuous Francine Graven, DO 125 mL/hr at 11/03/14 0825    . acetaminophen (TYLENOL) tablet 650 mg  650 mg Oral Q6H PRN Ivor Costa, MD       Or  . acetaminophen (TYLENOL) suppository 650 mg  650 mg Rectal Q6H PRN Ivor Costa, MD      . apixaban Arne Cleveland) tablet 5 mg  5 mg Oral BID Ivor Costa, MD   5 mg at 11/03/14 1109  . famotidine (PEPCID) tablet 20 mg  20 mg Oral Daily Robbie Lis, MD   20 mg at 11/03/14 1339  . FLUoxetine (PROZAC) capsule 20 mg  20 mg Oral Daily Ivor Costa, MD   20 mg at 11/03/14 1108  . HYDROmorphone (DILAUDID) injection 1 mg  1 mg Intravenous Q3H PRN Ivor Costa, MD   1 mg at 11/03/14 1109  . iohexol (OMNIPAQUE) 350 MG/ML injection 100 mL  100 mL Intravenous Once PRN Medication Radiologist, MD      . levETIRAcetam (KEPPRA) 500 mg in sodium chloride 0.9 % 100 mL IVPB  500 mg Intravenous Once Ivor Costa,  MD      . levETIRAcetam (KEPPRA) tablet 500 mg  500 mg Oral BID Ivor Costa, MD   500 mg at 11/03/14 1109  . methadone (DOLOPHINE) tablet 85 mg  85 mg Oral Q breakfast Ivor Costa, MD   85 mg at 11/03/14 0818  . nicotine (NICODERM CQ - dosed in mg/24 hours) patch 21 mg  21 mg Transdermal Daily Ivor Costa, MD   21 mg at 11/03/14 0340  . ondansetron (ZOFRAN) injection 4 mg  4 mg Intravenous Q8H PRN Ivor Costa, MD      . polyethylene glycol (MIRALAX / GLYCOLAX) packet 17 g  17 g Oral BID PRN Ivor Costa, MD      . sodium chloride 0.9 % bolus 1,500 mL  1,500 mL Intravenous Once Ivor Costa, MD   1,500 mL at 11/03/14 0215  . sodium chloride 0.9 % injection 3 mL  3 mL Intravenous Q12H Ivor Costa, MD   3 mL at 11/03/14 1115    Allergies as of 11/02/2014 - Review Complete 11/02/2014  Allergen Reaction Noted  . Ativan [lorazepam] Other (See Comments) 07/18/2014  . Bee venom Swelling 06/16/2014  . Ciprofloxacin Diarrhea and Nausea And Vomiting 07/10/2012   Review of Systems:    As per HPI, otherwise negative    Physical Exam:  Vital signs in last 24 hours: Temp:  [98 F (36.7 C)-98.5 F (36.9 C)] 98 F (36.7 C) (08/25 1437) Pulse Rate:  [67-99] 67 (08/25 1437) Resp:  [16-20] 20 (08/25 1437) BP: (90-123)/(42-84) 97/50 mmHg (08/25 1437) SpO2:  [91 %-100 %] 91 % (08/25 1437) Last BM Date: 11/02/14 General:   Pleasant obese white female in NAD Head:  Normocephalic and atraumatic. Eyes:   No icterus.   Conjunctiva pink. Ears:  Normal auditory acuity. Neck:  Supple Lungs:  Respirations even and unlabored. Occasional inspiratory wheeze in RUL. Lungs otherwise clear to auscultation bilaterally.  Heart:  Regular rate and rhythm Abdomen:  Soft, obese, nondistended, mild left mid abdominal tenderness. Normal bowel sounds. No appreciable masses or hepatomegaly.  Rectal:  Not performed.  Msk:  Symmetrical without gross deformities.  Extremities:  Without edema. Neurologic:  Alert and  oriented x4;  grossly  normal neurologically. Skin:  Intact without significant lesions or rashes. Psych:  Alert and cooperative. Normal affect.  LAB RESULTS:  Recent Labs  11/02/14 0924 11/03/14 0451  WBC 6.9 5.0  HGB 13.3 10.6*  HCT 42.5 35.1*  PLT 222 198   BMET  Recent Labs  11/02/14 0924 11/03/14 0451  NA 136 137  K 3.9 4.3  CL 105 111  CO2 24 21*  GLUCOSE 90 89  BUN 14 12  CREATININE 0.83 0.70  CALCIUM 8.8* 7.5*   LFT  Recent Labs  11/03/14 0451  PROT 3.8*  ALBUMIN 2.3*  AST 21  ALT 18  ALKPHOS 37*  BILITOT 0.5   PT/INR  Recent Labs  11/03/14 0451  LABPROT 15.8*  INR 1.24    STUDIES: Ct Abdomen Pelvis W Contrast  11/02/2014   CLINICAL DATA:  Abdominal pain since leaving the hospital a few weeks ago. 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  EXAM: CT ABDOMEN AND PELVIS WITH CONTRAST  TECHNIQUE: Multidetector CT imaging of the abdomen and pelvis was performed using the standard protocol following bolus administration of intravenous contrast.  CONTRAST:  15m OMNIPAQUE IOHEXOL 300 MG/ML  SOLN  COMPARISON:  10/11/2014  FINDINGS: 3 mm nodule in the left lung base without change since prior study.  Small focal fatty infiltration in the liver adjacent to the falciform ligament. Tiny sub cm low-attenuation lesion in the medial segment left lobe unchanged since prior study. This too small to characterize but probably represents a cyst or hemangioma. No other focal liver lesions. Gallbladder, spleen, pancreas, adrenal glands, kidneys, abdominal aorta, inferior vena cava, and retroperitoneal lymph nodes are unremarkable. Stomach, small bowel, and colon are not abnormally distended. Residual thickening of the wall of the distal ileum. This could represent residua of previous  vascular compromise or inflammatory change. Visualized portions of the superior mesenteric artery and vein appear patent. No significant residual portal venous thrombosis. Small amount of free fluid in the lower  abdominal mesenteric in the area of thickened bowel. No pneumatosis or portal venous gas. No free air. Mild prominence of mesenteric lymph nodes, probably reactive.  Pelvis: Small amount of free fluid in the pelvis could be reactive or physiologic. Uterus and ovaries are not enlarged. Mild bladder wall thickening may be due to under distention or cystitis. Appendix is normal. No pelvic mass or lymphadenopathy. No destructive bone lesions.  IMPRESSION: Residual bowel wall thickening in the low abdomen with small amount of adjacent free fluid. This could represent residual vascular compromise or inflammatory process. The previous portal venous thrombosis has resolved.   Electronically Signed   By: Lucienne Capers M.D.   On: 11/02/2014 23:59   Ct Angio Abd/pel W/ And/or W/o  11/03/2014   CLINICAL DATA:  Severe left lower quadrant pain, and nausea. Patient with portal venous and cerebral venous thrombosis.  EXAM: CT ANGIOGRAPHY ABDOMEN AND PELVIS WITH CONTRAST AND WITHOUT CONTRAST  TECHNIQUE: Multidetector CT imaging of the abdomen and pelvis was performed using the standard protocol during bolus administration of intravenous contrast. Multiplanar reconstructed images including MIPs were obtained and reviewed to evaluate the vascular anatomy.  CONTRAST:  100 cc Omnipaque 350.  COMPARISON:  CT of the abdomen pelvis dated 11/02/2014  FINDINGS: Arterial findings  Aorta  Normal in caliber, without filling defects or stenosis.  Celiac axis  Normal in caliber, without filling defects or stenosis.  Superior mesenteric  Normal in caliber, without filling defects or stenosis.  Left renal  Normal in caliber, without filling defects or stenosis. Accessory left renal artery is noted.  Right renal  Normal in caliber, without filling defects or stenosis.  Inferior mesenteric  Normal in caliber, without filling defects or stenosis.  Left iliac  Normal in caliber, without filling defects or stenosis.  Right iliac  Normal in  caliber, without filling defects or stenosis.  Venous findings  No evidence of portal venous thrombosis.  Review of the MIP images confirms the above findings.  Nonvascular findings  There is mesenteric stranding and small amount of free fluid within the mid left abdomen. There is also a long segment of bowel thickening of the distal third of the ileum. The distal most ileum is noted to be distended and fluid-filled with maximum diameter of 2.7 cm. The terminal ileum is prominent. The appendix is normal. The remainder of the upper GI tract and colon are normal. Multiple sub cm rounded mesenteric lymph nodes are seen within the same area of the abdomen. There is a tiny amount of free fluid, tracking along the left pericolic gutter. There is a small amount of free fluid within the pelvis as well.  Focal fatty infiltration along the falciform ligament of the liver is noted, otherwise the liver appears normal. The spleen, pancreas, bilateral adrenal glands, bilateral kidneys are normal. Vicarious excretion of contrast within the otherwise normal gallbladder is noted.  IMPRESSION: No evidence of a arterial or venous thrombosis.  Increased mesenteric stranding with interspersed shotty mesenteric lymph nodes within the mid left abdomen, in the area of long segment of thickening of the distal ileum. Thickening of the terminal ileum and a focal distention of up to 2.7 cm of the distal ileum leading to it. These findings may represent infectious, inflammatory or ischemic bowel changes, with associated reactive lymphadenopathy. Alternatively mesenteric adenitis  is of consideration.   Electronically Signed   By: Fidela Salisbury M.D.   On: 11/03/2014 11:42    PREVIOUS ENDOSCOPIES:            none   Impression / Plan:   58. 24 year old female with history of CVA following birth of child several months ago. Seen by Hematology following CVA but, workup negative for hypercoagulable state.  Despite negative hematology  evaluation patient was kept on Eliquis     Patient hospitalized late July with abd pain / rectal bleeding. Found to have acute portal vein thrombosis, , right sided colitis and thickening of distal TI. We felt patient has suffered ischemic insult to her bowel. Hematology saw, ordered additional labs for hypercoagulable state. Patient was to follow up with them outpatient.   Patient now readmitted with same type abdominal pain but no rectal bleeding this time. Repeat CTangio reveals resolution of PVT but there is a long segment of thickening of the terminal ileum with dilated bowel proximal to the area.   PVT has resolved on repeat imaging. Still need to consider recurrent ischemic event. Patient states she has been taking Eliquis everyday. Seems unlikely that she has IBD.  Recommend General Surgery consult. May need Vascular Consult at some point.   2. Tobacco abuse  3. Hx of drug addiction. Currently on Methadone which she started several months ago to get off pain pills.    Thanks   LOS: 0 days   Tye Savoy  11/03/2014, 3:30 PM  GI ATTENDING  History,labs,x-rays reviewed. Agree with above consult note. Complicated case. Recent mesenteric ischemia secondary to vascular thrombosis. Pain is moderate. CTA shows residual inflammation in the affected area but no arterial or venous thrombosis currently. At this point, care is supportive. Would obtain surgical opinion early, rather than late. No specific GI therapies or interventions to recommend.  Docia Chuck. Geri Seminole., M.D. [facility] Division of Gastroenterology

## 2014-11-03 NOTE — Progress Notes (Signed)
Dr. Clyde Lundborg notified IV team nurse was unable to start IV. States we need to get IV started so that pt can get CT angio abd/pelvis. RN to page IV team again to attempt with Ultrasound guide.

## 2014-11-03 NOTE — ED Notes (Signed)
Dr Niu at bedside 

## 2014-11-03 NOTE — Consult Note (Signed)
Reason for Consult:  Abdominal pain  Chief complaint: Pt states she started having severe sharpe pain again upper abdomen that is acute, and like what she had when admitted in July.  The pain comes and goes, but is severe enough that she returns for evaluation.  She is able to eat, denies constiption, but does not feel like she has adequately evacuated her bowel with daily BM's  PCP:  [facility]:  [facility] Referring Physician: Leisa Lenz  Victoria Alvarez is an 24 y.o. female.  HPI: Pt recently hospitalized 7/30-10/16/14  with abdominal pain; with CT showing pneumatosis of the cecum, with a normal left and sigmoid colon. She complained of diarrhea for some time prior to admission.  She had no acute surgical issue.  She was seen by GI, and treated for chronic constipation and rectal bleeding.  She is on chronic anticoagulation and this was held till they treated her constipation with enemas, and cathartics.  She was placed on Linzess and then her Eliquis was restarted.  She had portal vein thrombosis on a second CT on 10/11/14;  this was attributed to non compliance with her anticoagulation therapy. SMA, and splenic veins were patent, but she had thickening of the terminal ileum and cecum with some surrounding inflammation.  This was reviewed by Dr. Gae Gallop V[facility] surgery and he recommended ongoing anticoagulation treatment which was resumed.  She showed slow improvement of her sx and was discharged on 10/16/14.   Now she returns complaining of abdominal pain again, on top of her chronic pain.  She was readmitted by Medicine on 8/25 with abdominal pain, nausea, no vomiting or diarrhea. Work up in ED last PM shows she is afebrile, BP low 100's- upper 90's. Labs show a normal CMP, with low protein and albumin.  WBC is 5.0,  H/h is stable.10.6/35.1. CT scan with angiography shows the aorta, celiac axis, SMA, left and right renal arteries were normal.  Increased mesenteric  stranding with interspersed shotty mesenteric lymph nodes within the mid left abdomen, in the area of long segment of thickening of the distal ileum. Thickening of the terminal ileum and a focal distention of up to 2.7 cm of the distal ileum leading to it. This was read as possible infectious, inflammatory or ischemic changes by radiology.   We are ask to see.   Past Medical History  Diagnosis Date  Interstitial cystitis   pain from this was reason for starting opioids age 62.   Opioid dependence    to Rx opioids.  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 Vagina surgery  ~ 2011    benign vaginal tumor removed at [facility].   . Cesarean section N/A 05/06/2014    Procedure: CESAREAN SECTION;  Surgeon: Truett Mainland, DO;  [facility];  Service: Obstetrics;  Laterality: N/A;  . Inguinal hernia repair Left 2008    Family History  Problem Relation Age of Onset  .  Diabetes Mother   . Heart disease Mother   . Hypertension Father   . Diabetes Maternal Grandmother   . Hypertension Maternal Grandmother   . Stroke Maternal Grandmother   . Diabetes Maternal Grandfather   . Hypertension Maternal Grandfather   . Diabetes Paternal Grandmother   . Diabetes Paternal Grandfather     Social History:  reports that she has been smoking Cigarettes.  She has a 4 pack-year smoking history. She has never used smokeless tobacco. She reports that she does not drink alcohol or use illicit  drugs.  Allergies:  Allergies  Allergen Reactions  . Ativan [Lorazepam] Other (See Comments)    " I don't remember anything."  . Bee Venom Swelling  . Ciprofloxacin Diarrhea and Nausea And Vomiting    Medications:  Prior to Admission:  Prescriptions prior to admission  Medication Sig Dispense Refill Last Dose  . apixaban (ELIQUIS) 5 MG TABS tablet Take 1 tablet (5 mg total) by mouth 2 (two) times daily. 60 tablet 0 11/02/2014 at Unknown time  . docusate sodium (COLACE) 100 MG capsule Take 1 capsule (100 mg total) by mouth 2 (two) times daily. 10 capsule 0 Past Month at Unknown time  . FLUoxetine (PROZAC) 20 MG capsule Take 1 capsule (20 mg total) by mouth daily. 30 capsule 3 11/02/2014 at Unknown time  . levETIRAcetam (KEPPRA) 500 MG tablet Take 1 tablet (500 mg total) by mouth 2 (two) times daily. 60 tablet 3 11/02/2014 at Unknown time  . methadone (DOLOPHINE) 5 MG tablet Take 17 tablets (85 mg total) by mouth daily. Home medication continued 30 tablet 0 11/02/2014 at Unknown time  . ondansetron (ZOFRAN) 4 MG tablet Take 1 tablet (4 mg total) by mouth every 6 (six) hours as needed for nausea. 20 tablet 0 Past Week at Unknown time  . polyethylene glycol (MIRALAX / GLYCOLAX) packet Take 17 g by mouth 2 (two) times daily as needed for moderate constipation. 28 each 0 Past Month at Unknown time  . EPINEPHrine 0.3 mg/0.3 mL IJ SOAJ injection Inject 0.3 mLs (0.3 mg total) into the muscle once. 1 Device 1 unknown   Scheduled: . apixaban  5 mg Oral BID  . famotidine  20 mg Oral Daily  . FLUoxetine  20 mg Oral Daily  . levETIRAcetam  500 mg Intravenous Once  . levETIRAcetam  500 mg Oral BID  . methadone  85 mg Oral Q breakfast  . nicotine  21 mg Transdermal Daily  . sodium chloride  1,500 mL Intravenous Once  . sodium chloride  3 mL Intravenous Q12H   Continuous: . sodium chloride 125 mL/hr at 11/03/14 0825   VPX:TGGYIRSWNIOEV **OR** acetaminophen, HYDROmorphone (DILAUDID) injection,  iohexol, ondansetron, polyethylene glycol Anti-infectives    None      Results for orders placed or performed during the hospital encounter of 11/02/14 (from the past 48 hour(s))  Comprehensive metabolic panel     Status: Abnormal   Collection Time: 11/02/14  9:24 AM  Result Value Ref Range   Sodium 136 135 - 145 mmol/L   Potassium 3.9 3.5 - 5.1 mmol/L   Chloride 105 101 - 111 mmol/L   CO2 24 22 - 32 mmol/L   Glucose, Bld 90 65 - 99 mg/dL   BUN 14 6 - 20 mg/dL   Creatinine, Ser 0.83 0.44 - 1.00 mg/dL   Calcium 8.8 (L) 8.9 - 10.3 mg/dL   Total Protein 5.0 (L) 6.5 - 8.1 g/dL   Albumin 3.1 (L) 3.5 -  5.0 g/dL   AST 21 15 - 41 U/L   ALT 25 14 - 54 U/L   Alkaline Phosphatase 47 38 - 126 U/L   Total Bilirubin 0.5 0.3 - 1.2 mg/dL   GFR calc non Af Amer >60 >60 mL/min   GFR calc Af Amer >60 >60 mL/min    Comment: (NOTE) The eGFR has been calculated using the CKD EPI equation. This calculation has not been validated in all clinical situations. eGFR's persistently <60 mL/min signify possible Chronic Kidney Disease.    Anion gap 7 5 - 15  CBC     Status: Abnormal   Collection Time: 11/02/14  9:24 AM  Result Value Ref Range   WBC 6.9 4.0 - 10.5 K/uL   RBC 5.91 (H) 3.87 - 5.11 MIL/uL   Hemoglobin 13.3 12.0 - 15.0 g/dL   HCT 42.5 36.0 - 46.0 %   MCV 71.9 (L) 78.0 - 100.0 fL   MCH 22.5 (L) 26.0 - 34.0 pg   MCHC 31.3 30.0 - 36.0 g/dL   RDW 28.6 (H) 11.5 - 15.5 %   Platelets 222 150 - 400 K/uL  Lipase, blood     Status: None   Collection Time: 11/02/14 10:02 PM  Result Value Ref Range   Lipase 33 22 - 51 U/L  POC urine preg, ED (not at [facility])     Status: None   Collection Time: 11/02/14 11:10 PM  Result Value Ref Range   Preg Test, Ur NEGATIVE NEGATIVE    Comment:        THE SENSITIVITY OF THIS METHODOLOGY IS >24 mIU/mL   Urinalysis, Routine w reflex microscopic (not at [facility])     Status: Abnormal   Collection Time: 11/02/14 11:25 PM  Result Value Ref Range   Color, Urine  YELLOW YELLOW   APPearance CLOUDY (A) CLEAR   Specific Gravity, Urine 1.016 1.005 - 1.030   pH 5.0 5.0 - 8.0   Glucose, UA NEGATIVE NEGATIVE mg/dL   Hgb urine dipstick NEGATIVE NEGATIVE   Bilirubin Urine NEGATIVE NEGATIVE   Ketones, ur NEGATIVE NEGATIVE mg/dL   Protein, ur NEGATIVE NEGATIVE mg/dL   Urobilinogen, UA 0.2 0.0 - 1.0 mg/dL   Nitrite NEGATIVE NEGATIVE   Leukocytes, UA NEGATIVE NEGATIVE    Comment: MICROSCOPIC NOT DONE ON URINES WITH NEGATIVE PROTEIN, BLOOD, LEUKOCYTES, NITRITE, OR GLUCOSE <1000 mg/dL.  I-Stat CG4 Lactic Acid, ED     Status: None   Collection Time: 11/03/14 12:39 AM  Result Value Ref Range   Lactic Acid, Venous 0.95 0.5 - 2.0 mmol/L  Lactic acid, plasma     Status: None   Collection Time: 11/03/14  4:51 AM  Result Value Ref Range   Lactic Acid, Venous 1.2 0.5 - 2.0 mmol/L  Protime-INR     Status: Abnormal   Collection Time: 11/03/14  4:51 AM  Result Value Ref Range   Prothrombin Time 15.8 (H) 11.6 - 15.2 seconds   INR 1.24 0.00 - 1.49  APTT     Status: None   Collection Time: 11/03/14  4:51 AM  Result Value Ref Range   aPTT 27 24 - 37 seconds  Comprehensive metabolic panel     Status: Abnormal   Collection Time: 11/03/14  4:51 AM  Result Value Ref Range   Sodium 137 135 - 145 mmol/L   Potassium 4.3 3.5 - 5.1 mmol/L   Chloride 111 101 - 111 mmol/L   CO2 21 (L) 22 - 32 mmol/L   Glucose, Bld  89 65 - 99 mg/dL   BUN 12 6 - 20 mg/dL   Creatinine, Ser 0.70 0.44 - 1.00 mg/dL   Calcium 7.5 (L) 8.9 - 10.3 mg/dL   Total Protein 3.8 (L) 6.5 - 8.1 g/dL   Albumin 2.3 (L) 3.5 - 5.0 g/dL   AST 21 15 - 41 U/L   ALT 18 14 - 54 U/L   Alkaline Phosphatase 37 (L) 38 - 126 U/L   Total Bilirubin 0.5 0.3 - 1.2 mg/dL   GFR calc non Af Amer >60 >60 mL/min   GFR calc Af Amer >60 >60 mL/min    Comment: (NOTE) The eGFR has been calculated using the CKD EPI equation. This calculation has not been validated in all clinical situations. eGFR's persistently <60 mL/min  signify possible Chronic Kidney Disease.    Anion gap 5 5 - 15  CBC     Status: Abnormal   Collection Time: 11/03/14  4:51 AM  Result Value Ref Range   WBC 5.0 4.0 - 10.5 K/uL   RBC 4.87 3.87 - 5.11 MIL/uL   Hemoglobin 10.6 (L) 12.0 - 15.0 g/dL    Comment: RESULT REPEATED AND VERIFIED   HCT 35.1 (L) 36.0 - 46.0 %   MCV 72.1 (L) 78.0 - 100.0 fL   MCH 21.8 (L) 26.0 - 34.0 pg   MCHC 30.2 30.0 - 36.0 g/dL   RDW 28.6 (H) 11.5 - 15.5 %   Platelets 198 150 - 400 K/uL  Type and screen     Status: None   Collection Time: 11/03/14  5:01 AM  Result Value Ref Range   ABO/RH(D) O NEG    Antibody Screen NEG    Sample Expiration 11/06/2014   Lactic acid, plasma     Status: None   Collection Time: 11/03/14  7:55 AM  Result Value Ref Range   Lactic Acid, Venous 1.2 0.5 - 2.0 mmol/L  Glucose, capillary     Status: None   Collection Time: 11/03/14 10:46 AM  Result Value Ref Range   Glucose-Capillary 78 65 - 99 mg/dL    Ct Abdomen Pelvis W Contrast  11/02/2014   CLINICAL DATA:  Abdominal pain since leaving the hospital a few weeks ago. 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  EXAM: CT ABDOMEN AND PELVIS WITH CONTRAST  TECHNIQUE: Multidetector CT imaging of the abdomen and pelvis was performed using the standard protocol following bolus administration of intravenous contrast.  CONTRAST:  172m OMNIPAQUE IOHEXOL 300 MG/ML  SOLN  COMPARISON:  10/11/2014  FINDINGS: 3 mm nodule in the left lung base without change since prior study.  Small focal fatty infiltration in the liver adjacent to the falciform ligament. Tiny sub cm low-attenuation lesion in the medial segment left lobe unchanged since prior study. This too small to characterize but probably represents a cyst or hemangioma. No other focal liver lesions. Gallbladder, spleen, pancreas, adrenal glands, kidneys, abdominal aorta, inferior vena cava, and retroperitoneal lymph nodes are unremarkable. Stomach, small bowel, and colon are not  abnormally distended. Residual thickening of the wall of the distal ileum. This could represent residua of previous vascular compromise or inflammatory change. Visualized portions of the superior mesenteric artery and vein appear patent. No significant residual portal venous thrombosis. Small amount of free fluid in the lower abdominal mesenteric in the area of thickened bowel. No pneumatosis or portal venous gas. No free air. Mild prominence of mesenteric lymph nodes, probably reactive.  Pelvis: Small amount of free fluid in the pelvis  could be reactive or physiologic. Uterus and ovaries are not enlarged. Mild bladder wall thickening may be due to under distention or cystitis. Appendix is normal. No pelvic mass or lymphadenopathy. No destructive bone lesions.  IMPRESSION: Residual bowel wall thickening in the low abdomen with small amount of adjacent free fluid. This could represent residual vascular compromise or inflammatory process. The previous portal venous thrombosis has resolved.   Electronically Signed   By: Lucienne Capers M.D.   On: 11/02/2014 23:59   Ct Angio Abd/pel W/ And/or W/o  11/03/2014   CLINICAL DATA:  Severe left lower quadrant pain, and nausea. Patient with portal venous and cerebral venous thrombosis.  EXAM: CT ANGIOGRAPHY ABDOMEN AND PELVIS WITH CONTRAST AND WITHOUT CONTRAST  TECHNIQUE: Multidetector CT imaging of the abdomen and pelvis was performed using the standard protocol during bolus administration of intravenous contrast. Multiplanar reconstructed images including MIPs were obtained and reviewed to evaluate the vascular anatomy.  CONTRAST:  100 cc Omnipaque 350.  COMPARISON:  CT of the abdomen pelvis dated 11/02/2014  FINDINGS: Arterial findings  Aorta  Normal in caliber, without filling defects or stenosis.  Celiac axis  Normal in caliber, without filling defects or stenosis.  Superior mesenteric  Normal in caliber, without filling defects or stenosis.  Left renal  Normal in  caliber, without filling defects or stenosis. Accessory left renal artery is noted.  Right renal  Normal in caliber, without filling defects or stenosis.  Inferior mesenteric  Normal in caliber, without filling defects or stenosis.  Left iliac  Normal in caliber, without filling defects or stenosis.  Right iliac  Normal in caliber, without filling defects or stenosis.  Venous findings  No evidence of portal venous thrombosis.  Review of the MIP images confirms the above findings.  Nonvascular findings  There is mesenteric stranding and small amount of free fluid within the mid left abdomen. There is also a long segment of bowel thickening of the distal third of the ileum. The distal most ileum is noted to be distended and fluid-filled with maximum diameter of 2.7 cm. The terminal ileum is prominent. The appendix is normal. The remainder of the upper GI tract and colon are normal. Multiple sub cm rounded mesenteric lymph nodes are seen within the same area of the abdomen. There is a tiny amount of free fluid, tracking along the left pericolic gutter. There is a small amount of free fluid within the pelvis as well.  Focal fatty infiltration along the falciform ligament of the liver is noted, otherwise the liver appears normal. The spleen, pancreas, bilateral adrenal glands, bilateral kidneys are normal. Vicarious excretion of contrast within the otherwise normal gallbladder is noted.  IMPRESSION: No evidence of a arterial or venous thrombosis.  Increased mesenteric stranding with interspersed shotty mesenteric lymph nodes within the mid left abdomen, in the area of long segment of thickening of the distal ileum. Thickening of the terminal ileum and a focal distention of up to 2.7 cm of the distal ileum leading to it. These findings may represent infectious, inflammatory or ischemic bowel changes, with associated reactive lymphadenopathy. Alternatively mesenteric adenitis is of consideration.   Electronically Signed    By: Fidela Salisbury M.D.   On: 11/03/2014 11:42    Review of Systems  Constitutional: Negative.   HENT: Negative.   Eyes: Negative.   Respiratory: Positive for wheezing.   Cardiovascular: Negative.   Gastrointestinal: Positive for heartburn and abdominal pain. Negative for nausea, vomiting, diarrhea, constipation, blood in stool and  melena.  Genitourinary: Negative.   Musculoskeletal: Negative.   Skin: Negative.   Neurological: Negative.   Endo/Heme/Allergies: Negative.   Psychiatric/Behavioral: Positive for depression.   Blood pressure 97/50, pulse 67, temperature 98 F (36.7 C), temperature source Oral, resp. rate 20, last menstrual period 10/03/2014, SpO2 91 %, not currently breastfeeding. Physical Exam  Constitutional: She is oriented to person, place, and time. She appears well-developed and well-nourished. No distress.  HENT:  Head: Normocephalic and atraumatic.  Nose: Nose normal.  Eyes: Conjunctivae and EOM are normal. Right eye exhibits no discharge. Left eye exhibits no discharge. No scleral icterus.  Neck: Normal range of motion. Neck supple. No JVD present. No tracheal deviation present. No thyromegaly present.  Cardiovascular: Normal rate, regular rhythm, normal heart sounds and intact distal pulses.   No murmur heard. Respiratory: Effort normal. No respiratory distress. She has wheezes (bilataeral wheezing). She has no rales. She exhibits no tenderness.  GI: Soft. Bowel sounds are normal. She exhibits no distension. There is tenderness (pain seems to be primirarly left side, more upper left than lower left.  She has chronic pain mid lower abdomen.).  Musculoskeletal: She exhibits no edema or tenderness.  Lymphadenopathy:    She has no cervical adenopathy.  Neurological: She is alert and oriented to person, place, and time. No cranial nerve deficit.  Skin: Skin is warm and dry. No rash noted. She is not diaphoretic. No erythema. No pallor.  Psychiatric: She has a  normal mood and affect. Her behavior is normal. Judgment and thought content normal.    Assessment/Plan: Recurrent abdominal pain Recent admission for pain with some cecal pneumatosis, and portal vein thrombosis, on chronic anticoagulation Possible illeitis Chronic abdominal pain/interstitial cystitis on Methadone, chronic opoid use since age 68 Hx of constipation secondary to opoid/Methadone use. 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 Post partum, 6 months  Plan:    She does not have any acute findings, aside from some wheezing.  She is tender on the left, more LUQ than LLQ, no pain on palpation on the right at all, she does not appear to have an acute surgical issue.   Dr. Marlou Starks is examining her and reviewing the CT findings.   We will follow with you.  Victoria Alvarez 11/03/2014, 3:51 PM

## 2014-11-03 NOTE — H&P (Addendum)
[facility] History and Physical  Victoria Alvarez TGG:[id number] DOB: December 07, 1990 DOA: 11/02/2014  Referring physician: ED physician PCP: Minerva Ends, MD  Specialists:   Chief Complaint: Abdominal pain  HPI: Victoria Alvarez is a 24 y.o. female with PMH of depression, opioid dependence, seizure, stroke, recent portal vein thrombus formation and cerebral venous sinus thrombus formation on Eliquis, who presents with abdominal pain.  Patient was recently hospitalized from 7/29 to 10/16/14 due to portal vein thrombus formation and cerebral venous sinus thrombus formation. She is on Eliquis currently. She states that she is taking Eliquis consistently and did not miss any dose. She has been having mild abdominal pain, which has been worsened since this morning. The pain is located in the left lower quadrant, 10 out of 10 in severity, crampy like pain, nonradiating. It is not aggravated or alleviated by any known factors. Patient has nausea, but no vomiting or diarrhea. She states that she had one bowel movement after she took some magnesium at home, but has no diarrhea. No symptoms of for UTI. Patient does not have chest pain, shortness of breath, cough, unilateral weakness.  In ED, patient was found to have lactate 0.95, AG=7, WBC 6.9, temperature normal, heart rate 99, electrolytes okay, lipase 33, negative urinalysis, negative pregnancy test. CT abdomen/pelvis with contrast showed: 1. residual bowel wall thickening in the low abdomen with small amount of adjacent free fluid. This could represent residual vascular compromise or inflammatory process. 2. The previous portal venous thrombosis has resolved. 3. Visualized portions of the superior mesenteric artery and vein appear patent.   Where does patient live?   At home Can patient participate in ADLs?  Yes    Review of Systems:   General: no fevers, chills, no changes in body weight, has poor appetite, has fatigue HEENT: no blurry vision,  hearing changes or sore throat Pulm: no dyspnea, coughing, wheezing CV: no chest pain, palpitations Abd: has nausea, abdominal pain, no diarrhea, constipation, vomiting GU: no dysuria, burning on urination, increased urinary frequency, hematuria  Ext: no leg edema Neuro: no unilateral weakness, numbness, or tingling, no vision change or hearing loss Skin: no rash MSK: No muscle spasm, no deformity, no limitation of range of movement in spin Heme: No easy bruising.  Travel history: No recent long distant travel.  Allergy:  Allergies  Allergen Reactions  . Ativan [Lorazepam] Other (See Comments)    " I don't remember anything."  . Bee Venom Swelling  . Ciprofloxacin Diarrhea and Nausea And Vomiting    Past Medical History  Diagnosis Date  . Interstitial cystitis     pain from this was reason for starting opioids age 62.   . Chronic pelvic pain in female   . Headache(784.0)   . Pyelonephritis affecting pregnancy in first trimester July 2015  . Depression 2007  . Opioid dependence     to Rx opioids.  switched to Methadone after second child born in 04/2014  . Pneumonia 03/2014  . Seizures   . Stroke 05/2014    presented with right sided weakness.   . Numbness     right side  . Dizziness 07/18/14  . Confusion   . 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 Preeclampsia 2012  . Miscarriage 2005  . Non-compliance     with anticoagulant  . 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 Cerebral venous sinus thrombosis     Past Surgical History  Procedure Laterality Date  . Vagina surgery  ~ 2011    benign  vaginal tumor removed at [facility].   . Cesarean section N/A 05/06/2014    Procedure: CESAREAN SECTION;  Surgeon: Truett Mainland, DO;  Location: [facility];  Service: Obstetrics;  Laterality: N/A;  . Inguinal hernia repair Left 2008    Social History:  reports that she has been smoking Cigarettes.  She has a 4 pack-year smoking history. She has never used smokeless tobacco. She reports that she does  not drink alcohol or use illicit drugs.  Family History:  Family History  Problem Relation Age of Onset  . Diabetes Mother   . Heart disease Mother   . Hypertension Father   . Diabetes Maternal Grandmother   . Hypertension Maternal Grandmother   . Stroke Maternal Grandmother   . Diabetes Maternal Grandfather   . Hypertension Maternal Grandfather   . Diabetes Paternal Grandmother   . Diabetes Paternal Grandfather      Prior to Admission medications   Medication Sig Start Date End Date Taking? Authorizing Provider  apixaban (ELIQUIS) 5 MG TABS tablet Take 1 tablet (5 mg total) by mouth 2 (two) times daily. 10/16/14 02/03/15  Thurnell Lose, MD  docusate sodium (COLACE) 100 MG capsule Take 1 capsule (100 mg total) by mouth 2 (two) times daily. 10/16/14   Thurnell Lose, MD  EPINEPHrine 0.3 mg/0.3 mL IJ SOAJ injection Inject 0.3 mLs (0.3 mg total) into the muscle once. 06/16/14   Arnoldo Morale, MD  FLUoxetine (PROZAC) 20 MG capsule Take 1 capsule (20 mg total) by mouth daily. 07/18/14   Garvin Fila, MD  levETIRAcetam (KEPPRA) 500 MG tablet Take 1 tablet (500 mg total) by mouth 2 (two) times daily. 06/13/14   Reyne Dumas, MD  methadone (DOLOPHINE) 5 MG tablet Take 17 tablets (85 mg total) by mouth daily. Home medication continued 10/16/14   Thurnell Lose, MD  ondansetron (ZOFRAN) 4 MG tablet Take 1 tablet (4 mg total) by mouth every 6 (six) hours as needed for nausea. 10/16/14   Thurnell Lose, MD  polyethylene glycol (MIRALAX / GLYCOLAX) packet Take 17 g by mouth 2 (two) times daily as needed for moderate constipation. 10/16/14   Thurnell Lose, MD    Physical Exam: Filed Vitals:   11/02/14 2249 11/02/14 2315 11/03/14 0058 11/03/14 0100  BP: 102/48 106/45 109/54 102/51  Pulse: 82 77 76 78  Temp:      TempSrc:      Resp:  16 17 17   SpO2: 98% 97% 97% 95%   General: Not in acute distress HEENT:       Eyes: PERRL, EOMI, no scleral icterus.       ENT: No discharge from the ears and  nose, no pharynx injection, no tonsillar enlargement.        Neck: No JVD, no bruit, no mass felt. Heme: No neck lymph node enlargement. Cardiac: S1/S2, RRR, No murmurs, No gallops or rubs. Pulm: No rales, wheezing, rhonchi or rubs. Abd: Soft, nondistended, severe tenderness over lower abdomen and is worst on the LLQ, no rebound pain, no organomegaly, BS present. Ext: No pitting leg edema bilaterally. 2+DP/PT pulse bilaterally. Musculoskeletal: No joint deformities, No joint redness or warmth, no limitation of ROM in spin. Skin: No rashes.  Neuro: Alert, oriented X3, cranial nerves II-XII grossly intact, muscle strength 5/5 in all extremities, sensation to light touch intact.  Psych: Patient is not psychotic, no suicidal or hemocidal ideation.  Labs on Admission:  Basic Metabolic Panel:  Recent Labs Lab 11/02/14 [phone]  NA 136  K 3.9  CL 105  CO2 24  GLUCOSE 90  BUN 14  CREATININE 0.83  CALCIUM 8.8*   Liver Function Tests:  Recent Labs Lab 11/02/14 0924  AST 21  ALT 25  ALKPHOS 47  BILITOT 0.5  PROT 5.0*  ALBUMIN 3.1*    Recent Labs Lab 11/02/14 2202  LIPASE 33   No results for input(s): AMMONIA in the last 168 hours. CBC:  Recent Labs Lab 11/02/14 0924  WBC 6.9  HGB 13.3  HCT 42.5  MCV 71.9*  PLT 222   Cardiac Enzymes: No results for input(s): CKTOTAL, CKMB, CKMBINDEX, TROPONINI in the last 168 hours.  BNP (last 3 results) No results for input(s): BNP in the last 8760 hours.  ProBNP (last 3 results) No results for input(s): PROBNP in the last 8760 hours.  CBG: No results for input(s): GLUCAP in the last 168 hours.  Radiological Exams on Admission: Ct Abdomen Pelvis W Contrast  11/02/2014   CLINICAL DATA:  Abdominal pain since leaving the hospital a few weeks ago. 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  EXAM: CT ABDOMEN AND PELVIS WITH CONTRAST  TECHNIQUE: Multidetector CT imaging of the abdomen and pelvis was performed using the  standard protocol following bolus administration of intravenous contrast.  CONTRAST:  197m OMNIPAQUE IOHEXOL 300 MG/ML  SOLN  COMPARISON:  10/11/2014  FINDINGS: 3 mm nodule in the left lung base without change since prior study.  Small focal fatty infiltration in the liver adjacent to the falciform ligament. Tiny sub cm low-attenuation lesion in the medial segment left lobe unchanged since prior study. This too small to characterize but probably represents a cyst or hemangioma. No other focal liver lesions. Gallbladder, spleen, pancreas, adrenal glands, kidneys, abdominal aorta, inferior vena cava, and retroperitoneal lymph nodes are unremarkable. Stomach, small bowel, and colon are not abnormally distended. Residual thickening of the wall of the distal ileum. This could represent residua of previous vascular compromise or inflammatory change. Visualized portions of the superior mesenteric artery and vein appear patent. No significant residual portal venous thrombosis. Small amount of free fluid in the lower abdominal mesenteric in the area of thickened bowel. No pneumatosis or portal venous gas. No free air. Mild prominence of mesenteric lymph nodes, probably reactive.  Pelvis: Small amount of free fluid in the pelvis could be reactive or physiologic. Uterus and ovaries are not enlarged. Mild bladder wall thickening may be due to under distention or cystitis. Appendix is normal. No pelvic mass or lymphadenopathy. No destructive bone lesions.  IMPRESSION: Residual bowel wall thickening in the low abdomen with small amount of adjacent free fluid. This could represent residual vascular compromise or inflammatory process. The previous portal venous thrombosis has resolved.   Electronically Signed   By: WLucienne CapersM.D.   On: 11/02/2014 23:59    EKG: Not done in ED, will get one.   Assessment/Plan Principal Problem:   Abdominal pain Active Problems:   Tobacco abuse   Seizure   Methadone maintenance  therapy patient   Portal vein thrombosis   Cerebral venous sinus thrombosis   Depression  Abdominal pain: Etiology is not clear. Lipase negative. No obstruction by CT abdomen/pelvis. A potential differential diagnosis is ischemic bowel given bowel wall thickening in the low abdomen with small amount of adjacent free fluid, but the patient has normal lactate and normal anatomic gap which are not consistent with this possibility. Per radiologist, visualized portions of the superior mesenteric artery and vein appear  patent and her previous portal venous thrombosis has resolved per radiologist. Another possibility is infectious etiology, but the patient does not have fever or leukocytosis.   -will admit to tele bed -Aggressive IVF: NS bolus 2.5 cc and then 125 cc/h -hold abx now, but will start Abx if patient develops fever. -check lactic acid q3h. If elevated, will do CT-angiogram of abd/pelvis to r/o ischemic bowel. -repeat CMP, if develops high anion gap, will do CT-angiogram of abd/pelvis to r/o ischemic bowel. -When necessary Dilaudid for pain, Zofran for nausea -check UDS,  -INR/PTT/type & screen  Addendum: I spoke with surgeon, dr. Hulen Skains, who suggested to get CTA of abd/pelvis to r/o ischemic bowel. -will do CTA of Abd/pelvis. If positive, will call surgeon.  Opioid dependence: on Methadone maintenance therapy -continue home Methadone  Seizure: -Continue Keppra -give one does of Keppra by IV: 500 mg x 1 -check keppra level  Tobacco abuse: -Did counseling about importance of quitting smoking -Nicotine patch  Portal vein thrombosis and Cerebral venous sinus thrombosis: -continue Eliquis  Depression; Stable, no suicidal or homicidal ideations. -Continue home medications: Prozac   DVT ppx: on Eliquis Code Status: Full code Family Communication: None at bed side. Disposition Plan: Admit to inpatient   Date of Service 11/03/2014    Ivor Costa [facility] Pager  [phone]  If 7PM-7AM, please contact night-coverage [email or web address] Password [email or web address] 11/03/2014, 2:17 AM

## 2014-11-03 NOTE — Care Management Note (Signed)
Case Management Note  Patient Details  Name: Victoria Alvarez MRN: [id number] Date of Birth: 1990-04-17  Subjective/Objective:  NCM spoke with patient, pta indep,  She has transportation at dc.  Patient sees Dr. Wells Guiles at [facility] clinic.  She has medicaid and can afford her meds.  NCM will cont to follow for dc needs.                   Action/Plan:   Expected Discharge Date:                  Expected Discharge Plan:  Home/Self Care  In-House Referral:     Discharge planning Services  CM Consult  Post Acute Care Choice:    Choice offered to:     DME Arranged:    DME Agency:     HH Arranged:    HH Agency:     Status of Service:  In process, will continue to follow  Medicare Important Message Given:    Date Medicare IM Given:    Medicare IM give by:    Date Additional Medicare IM Given:    Additional Medicare Important Message give by:     If discussed at Long Length of Stay Meetings, dates discussed:    Additional Comments:  Leone Haven, RN 11/03/2014, 3:15 PM

## 2014-11-03 NOTE — Progress Notes (Signed)
Pt loss IV access. IV team RN paged. IV team RN unable to gain access after multiple attempts. Will need to ultrasound guide for IV access. Schorr, NP notified.

## 2014-11-03 NOTE — Progress Notes (Addendum)
Patient ID: Victoria Alvarez, female   DOB: May 05, 1990, 24 y.o.   MRN: [id number] [facility] PROGRESS NOTE  Sherlee Detlefsen ONG:[id number] DOB: May 08, 1990 DOA: 11/02/2014 PCP: Lora Paula, MD  Brief narrative:    Addendum to admission note done 11/03/2014.  24 y.o. female with past medical history of depression, opioid dependence, seizure, stroke, recent portal vein thrombus formation and cerebral venous sinus thrombus formation on Eliquis who presented to [facility] ED with reports of worsening abdominal pain in left lower quadrant over past 24 hours prior to this admission. Pain is associated with nausea but no vomiting . No fevers. On admission, pt was hemodynamically stable. CT abdomen demonstrated residual bowel wall thickening in the low abdomen with small amount of adjacent free fluid which could represent residual vascular compromise or inflammatory process. Previously diagnosed portal venous thrombosis has resolved.  Anticipated discharge: possibly in next 1-2 days if abdominal pain better. D/C likely by 11/05/2014.  Assessment/Plan:    Principal Problem:   Abdominal pain - CT abdomen on admission demonstrated residual bowel wall thickening in the low abdomen with small amount of adjacent free fluid which could represent residual vascular compromise or inflammatory process. Previously diagnosed portal venous thrombosis has resolved. - Will get GI input if work up needed - Continue IV fluids for supportive care - Continue pain management efforts, will not increase narcotic dose or frequency narcotic dependence  Active Problems:   Tobacco abuse - Nicotine patch ordered    Seizure - Continue Keppra    Methadone maintenance therapy patient - Stable    Portal vein thrombosis / Cerebral venous sinus thrombosis - Per CT scan portal vein thrombosis resolved    DVT Prophylaxis  - On anticoagulation with apixaban    Code Status: Full.  Family Communication:  plan of care  discussed with the patient Disposition Plan: Home once abdominal pain improves likely in next few days   IV access:  Peripheral IV  Procedures and diagnostic studies:    Ct Abdomen Pelvis W Contrast 11/02/2014   Residual bowel wall thickening in the low abdomen with small amount of adjacent free fluid. This could represent residual vascular compromise or inflammatory process. The previous portal venous thrombosis has resolved.   Electronically Signed   By: Burman Nieves M.D.   On: 11/02/2014 23:59   Medical Consultants:  Gastroenterology  Other Consultants:  None   IAnti-Infectives:   None    Manson Passey, MD  [facility] Pager [phone]   If 7PM-7AM, please contact night-coverage [email or web address] Password [facility] 11/03/2014, 10:33 AM   LOS: 0 days    HPI/Subjective: No acute overnight events. Patient reports abdominal to be 8/10 this am.   Objective: Filed Vitals:   11/03/14 0058 11/03/14 0100 11/03/14 0245 11/03/14 0300  BP: 109/54 102/51 99/49 97/73   Pulse: 76 78 78 74  Temp:    98.5 F (36.9 C)  TempSrc:    Oral  Resp: 17 17 17 18   SpO2: 97% 95% 98% 100%    Intake/Output Summary (Last 24 hours) at 11/03/14 1033 Last data filed at 11/03/14 0707  Gross per 24 hour  Intake   1000 ml  Output      0 ml  Net   1000 ml    Exam:   General:  Pt is alert, follows commands appropriately, not in acute distress  Cardiovascular: Regular rate and rhythm, S1/S2, no murmurs  Respiratory: Clear to auscultation bilaterally, no wheezing, no crackles, no rhonchi  Abdomen: Soft, tender in lower  abdomen, no guarding   Extremities: No edema, pulses DP and PT palpable bilaterally  Neuro: Grossly nonfocal  Data Reviewed: Basic Metabolic Panel:  Recent Labs Lab 11/02/14 0924 11/03/14 0451  NA 136 137  K 3.9 4.3  CL 105 111  CO2 24 21*  GLUCOSE 90 89  BUN 14 12  CREATININE 0.83 0.70  CALCIUM 8.8* 7.5*   Liver Function Tests:  Recent Labs Lab  11/02/14 0924 11/03/14 0451  AST 21 21  ALT 25 18  ALKPHOS 47 37*  BILITOT 0.5 0.5  PROT 5.0* 3.8*  ALBUMIN 3.1* 2.3*    Recent Labs Lab 11/02/14 2202  LIPASE 33   No results for input(s): AMMONIA in the last 168 hours. CBC:  Recent Labs Lab 11/02/14 0924 11/03/14 0451  WBC 6.9 5.0  HGB 13.3 10.6*  HCT 42.5 35.1*  MCV 71.9* 72.1*  PLT 222 198   Cardiac Enzymes: No results for input(s): CKTOTAL, CKMB, CKMBINDEX, TROPONINI in the last 168 hours. BNP: Invalid input(s): POCBNP CBG: No results for input(s): GLUCAP in the last 168 hours.  No results found for this or any previous visit (from the past 240 hour(s)).   Scheduled Meds: . apixaban  5 mg Oral BID  . FLUoxetine  20 mg Oral Daily  . levETIRAcetam  500 mg Intravenous Once  . levETIRAcetam  500 mg Oral BID  . methadone  85 mg Oral Q breakfast  . nicotine  21 mg Transdermal Daily   Continuous Infusions: . sodium chloride 125 mL/hr at 11/03/14 0825

## 2014-11-03 NOTE — Plan of Care (Addendum)
Pt complaining for acid reflux and gas pain. Dr. notified to request PRN. Verbal orders given for 20mg  Pepcid.

## 2014-11-03 NOTE — Progress Notes (Signed)
Received report from [facility] in ED for transfer of pt to 5W24.

## 2014-11-03 NOTE — Care Management (Signed)
This Case Manager received call from Letha Cape, RN CM who indicated patient needing hospital follow-up appointment with Dr. Armen Pickup.  Appointment obtained on 11/10/14 at 1615 with Dr. Armen Pickup.  Appointment time placed on AVS.  Letha Cape, RN CM updated.

## 2014-11-04 ENCOUNTER — Other Ambulatory Visit: Payer: Medicaid Other

## 2014-11-04 ENCOUNTER — Inpatient Hospital Stay: Admission: RE | Admit: 2014-11-04 | Payer: Medicaid Other | Source: Ambulatory Visit

## 2014-11-04 DIAGNOSIS — K5901 Slow transit constipation: Secondary | ICD-10-CM

## 2014-11-04 DIAGNOSIS — R109 Unspecified abdominal pain: Secondary | ICD-10-CM

## 2014-11-04 DIAGNOSIS — I81 Portal vein thrombosis: Secondary | ICD-10-CM

## 2014-11-04 LAB — GLUCOSE, CAPILLARY: Glucose-Capillary: 92 mg/dL (ref 65–99)

## 2014-11-04 MED ORDER — MAGNESIUM HYDROXIDE 400 MG/5ML PO SUSP
30.0000 mL | ORAL | Status: AC
  Administered 2014-11-04: 30 mL via ORAL
  Filled 2014-11-04: qty 30

## 2014-11-04 MED ORDER — MAGNESIUM HYDROXIDE 400 MG/5ML PO SUSP
30.0000 mL | ORAL | Status: DC | PRN
  Administered 2014-11-04: 30 mL via ORAL
  Filled 2014-11-04: qty 30

## 2014-11-04 MED ORDER — ONDANSETRON HCL 4 MG/2ML IJ SOLN
4.0000 mg | Freq: Four times a day (QID) | INTRAMUSCULAR | Status: AC | PRN
  Administered 2014-11-04: 4 mg via INTRAVENOUS
  Filled 2014-11-04: qty 2

## 2014-11-04 NOTE — Progress Notes (Signed)
Patient complained of nausea. MD notified.

## 2014-11-04 NOTE — Progress Notes (Signed)
Patient ID: Victoria Alvarez, female   DOB: March 11, 1991, 24 y.o.   MRN: [id number] [facility] PROGRESS NOTE  Victoria Alvarez WUJ:[id number] DOB: 27-Sep-1990 DOA: 11/02/2014 PCP: Lora Paula, MD  Brief narrative:    24 y.o. female with past medical history of depression, opioid dependence, seizure, stroke, recent portal vein thrombus formation and cerebral venous sinus thrombus formation on Eliquis who presented to [facility] ED with reports of worsening abdominal pain in left lower quadrant over past 24 hours prior to this admission. Pain is associated with nausea but no vomiting . No fevers.  On admission, pt was hemodynamically stable. CT abdomen demonstrated residual bowel wall thickening in the low abdomen with small amount of adjacent free fluid which could represent residual vascular compromise or inflammatory process. Previously diagnosed portal venous thrombosis has resolved.  Anticipated discharge: Symptomatic treatment for now and hopefully discharge by 11/06/2014.  Assessment/Plan:    Principal Problem:   Abdominal pain - CT abdomen on admission demonstrated residual bowel wall thickening in the low abdomen with small amount of adjacent free fluid which could represent residual vascular compromise or inflammatory process. Previously diagnosed portal venous thrombosis has resolved. - GI and surgery on board. For now recommendation is for supportive care. - Continue pain management efforts. - We'll see if we can advance diet to clear liquids.  Active Problems:   Tobacco abuse - Nicotine patch ordered    Seizure - Continue Keppra - No reports of seizures    Methadone maintenance therapy patient - Stable    Portal vein thrombosis / Cerebral venous sinus thrombosis - Per CT scan portal vein thrombosis resolved - On anticoagulation with apixaban    DVT Prophylaxis  - On anticoagulation with apixaban    Code Status: Full.  Family Communication:  plan of care discussed  with the patient Disposition Plan: Home once abdominal pain improves likely by 11/06/2014.  IV access:  Peripheral IV  Procedures and diagnostic studies:    Ct Abdomen Pelvis W Contrast 11/02/2014   Residual bowel wall thickening in the low abdomen with small amount of adjacent free fluid. This could represent residual vascular compromise or inflammatory process. The previous portal venous thrombosis has resolved.   Electronically Signed   By: Burman Nieves M.D.   On: 11/02/2014 23:59   Medical Consultants:  Gastroenterology Surgery   Other Consultants:  None   IAnti-Infectives:   None    Manson Passey, MD  [facility] Pager [phone]   If 7PM-7AM, please contact night-coverage [email or web address] Password [email or web address] 11/04/2014, 11:45 AM   LOS: 1 day    HPI/Subjective: No acute overnight events. Patient reports abdominal pain is 5-6/10.  Objective: Filed Vitals:   11/03/14 0300 11/03/14 1046 11/03/14 1437 11/04/14 0546  BP: 97/73 97/52 97/50  93/47  Pulse: 74 76 67 65  Temp: 98.5 F (36.9 C) 98.4 F (36.9 C) 98 F (36.7 C) 99.2 F (37.3 C)  TempSrc: Oral Oral Oral Oral  Resp: 18 18 20 12   SpO2: 100% 98% 91% 97%    Intake/Output Summary (Last 24 hours) at 11/04/14 1145 Last data filed at 11/04/14 1131  Gross per 24 hour  Intake 4415.83 ml  Output   1400 ml  Net 3015.83 ml    Exam:   General:  Pt is alert, not in acute distress  Cardiovascular: Regular rate and rhythm, S1/S2 (+)  Respiratory: No wheezing, no crackles, no rhonchi  Abdomen: tender in mid to left lower abdomen without rebound or guarding, (+) BS  Extremities:  No leg swelling, pulses palpable   Neuro: Nonfocal  Data Reviewed: Basic Metabolic Panel:  Recent Labs Lab 11/02/14 0924 11/03/14 0451  NA 136 137  K 3.9 4.3  CL 105 111  CO2 24 21*  GLUCOSE 90 89  BUN 14 12  CREATININE 0.83 0.70  CALCIUM 8.8* 7.5*   Liver Function Tests:  Recent Labs Lab 11/02/14 0924  11/03/14 0451  AST 21 21  ALT 25 18  ALKPHOS 47 37*  BILITOT 0.5 0.5  PROT 5.0* 3.8*  ALBUMIN 3.1* 2.3*    Recent Labs Lab 11/02/14 2202  LIPASE 33   No results for input(s): AMMONIA in the last 168 hours. CBC:  Recent Labs Lab 11/02/14 0924 11/03/14 0451  WBC 6.9 5.0  HGB 13.3 10.6*  HCT 42.5 35.1*  MCV 71.9* 72.1*  PLT 222 198   Cardiac Enzymes: No results for input(s): CKTOTAL, CKMB, CKMBINDEX, TROPONINI in the last 168 hours. BNP: Invalid input(s): POCBNP CBG:  Recent Labs Lab 11/03/14 1046 11/04/14 0751  GLUCAP 78 92    No results found for this or any previous visit (from the past 240 hour(s)).   Scheduled Meds: . apixaban  5 mg Oral BID  . FLUoxetine  20 mg Oral Daily  . levETIRAcetam  500 mg Intravenous Once  . levETIRAcetam  500 mg Oral BID  . methadone  85 mg Oral Q breakfast  . nicotine  21 mg Transdermal Daily   Continuous Infusions: . sodium chloride 125 mL/hr (11/04/14 1049)

## 2014-11-04 NOTE — Progress Notes (Signed)
    Progress Note   Subjective  still has constant left mid and left upper dull pain with intermittent sharp, more diffuse pain   Objective   Vital signs in last 24 hours: Temp:  [98 F (36.7 C)-99.2 F (37.3 C)] 99.2 F (37.3 C) (08/26 0546) Pulse Rate:  [65-76] 65 (08/26 0546) Resp:  [12-20] 12 (08/26 0546) BP: (93-97)/(47-52) 93/47 mmHg (08/26 0546) SpO2:  [91 %-98 %] 97 % (08/26 0546) Last BM Date: 11/02/14 General:    white female in NAD Heart:  Regular rate and rhythm; no murmurs Lungs: Respirations even and unlabored, lungs CTA bilaterally Abdomen:  Soft, mild LUQ tenderness,  nondistended. Normal bowel sounds. Extremities:  Without edema. Neurologic:  Alert and oriented,  grossly normal neurologically. Psych:  Cooperative. Normal mood and affect.    Assessment / Plan:   7. 24 year old female with recent bowel ischemia secondary to acute PVT. Thrombosis resolved on recent imaging. She is on Eliquis. Still with residual inflammation of ileum on CTscan. Surgery has evaluated. Care is supportive for now. Will give clears.   2. Tobacco abuse    LOS: 1 day   Tye Savoy  11/04/2014, 9:46 AM   GI ATTENDING  Interval history and data reviewed. Patient personally seen and examined. Agree with interval progress note as outlined. Patient was ambulating and Hall without difficulty. Come back to her room. Complaining of vague abdominal discomfort which does not seem severe. Her chief complaint is constipation. Abdominal exam was only for minimal tenderness. She was given MiraLAX earlier without results. She states milk of magnesia works well. We will prescribe this. Continue ambulate. Advance diet as tolerated. Minimize narcotics. Will sign off.  Docia Chuck. Geri Seminole., M.D. [facility] Division of Gastroenterology

## 2014-11-04 NOTE — Progress Notes (Signed)
Patient ID: Victoria Alvarez, female   DOB: [birth year], 24 y.o.   MRN: [id number]    Subjective: Pt still with some nausea, but wants to eat.  Still with some abdominal pain, but diarrhea seems better  Objective: Vital signs in last 24 hours: Temp:  [98 F (36.7 C)-99.2 F (37.3 C)] 99.2 F (37.3 C) (08/26 0546) Pulse Rate:  [65-76] 65 (08/26 0546) Resp:  [12-20] 12 (08/26 0546) BP: (93-97)/(47-52) 93/47 mmHg (08/26 0546) SpO2:  [91 %-98 %] 97 % (08/26 0546) Last BM Date: 11/02/14  Intake/Output from previous day: 08/25 0701 - 08/26 0700 In: 3070.8 [I.V.:3070.8] Out: -  Intake/Output this shift:    PE: Abd: soft, mild diffuse tenderness, but no rebounding, guarding, or peritoneal signs, some BS, ND Heart: regular Lungs: CTAB  Lab Results:   Recent Labs  11/02/14 0924 11/03/14 0451  WBC 6.9 5.0  HGB 13.3 10.6*  HCT 42.5 35.1*  PLT 222 198   BMET  Recent Labs  11/02/14 0924 11/03/14 0451  NA 136 137  K 3.9 4.3  CL 105 111  CO2 24 21*  GLUCOSE 90 89  BUN 14 12  CREATININE 0.83 0.70  CALCIUM 8.8* 7.5*   PT/INR  Recent Labs  11/03/14 0451  LABPROT 15.8*  INR 1.24   CMP     Component Value Date/Time   NA 137 11/03/2014 0451   K 4.3 11/03/2014 0451   CL 111 11/03/2014 0451   CO2 21* 11/03/2014 0451   GLUCOSE 89 11/03/2014 0451   BUN 12 11/03/2014 0451   CREATININE 0.70 11/03/2014 0451   CREATININE 0.81 07/12/2014 1717   CALCIUM 7.5* 11/03/2014 0451   PROT 3.8* 11/03/2014 0451   ALBUMIN 2.3* 11/03/2014 0451   AST 21 11/03/2014 0451   ALT 18 11/03/2014 0451   ALKPHOS 37* 11/03/2014 0451   BILITOT 0.5 11/03/2014 0451   GFRNONAA >60 11/03/2014 0451   GFRNONAA >89 07/12/2014 1717   GFRAA >60 11/03/2014 0451   GFRAA >89 07/12/2014 1717   Lipase     Component Value Date/Time   LIPASE 33 11/02/2014 2202       Studies/Results: Ct Abdomen Pelvis W Contrast  11/02/2014   CLINICAL DATA:  Abdominal pain since leaving the hospital a few weeks  ago. 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  EXAM: CT ABDOMEN AND PELVIS WITH CONTRAST  TECHNIQUE: Multidetector CT imaging of the abdomen and pelvis was performed using the standard protocol following bolus administration of intravenous contrast.  CONTRAST:  OMNIPAQUE IOHEXOL 300 MG/ML  SOLN  COMPARISON:  10/11/2014  FINDINGS: 3 mm nodule in the left lung base without change since prior study.  Small focal fatty infiltration in the liver adjacent to the falciform ligament. Tiny sub cm low-attenuation lesion in the medial segment left lobe unchanged since prior study. This too small to characterize but probably represents a cyst or hemangioma. No other focal liver lesions. Gallbladder, spleen, pancreas, adrenal glands, kidneys, abdominal aorta, inferior vena cava, and retroperitoneal lymph nodes are unremarkable. Stomach, small bowel, and colon are not abnormally distended. Residual thickening of the wall of the distal ileum. This could represent residua of previous vascular compromise or inflammatory change. Visualized portions of the superior mesenteric artery and vein appear patent. No significant residual portal venous thrombosis. Small amount of free fluid in the lower abdominal mesenteric in the area of thickened bowel. No pneumatosis or portal venous gas. No free air. Mild prominence of mesenteric lymph nodes, probably reactive.  Pelvis:  Small amount of free fluid in the pelvis could be reactive or physiologic. Uterus and ovaries are not enlarged. Mild bladder wall thickening may be due to under distention or cystitis. Appendix is normal. No pelvic mass or lymphadenopathy. No destructive bone lesions.  IMPRESSION: Residual bowel wall thickening in the low abdomen with small amount of adjacent free fluid. This could represent residual vascular compromise or inflammatory process. The previous portal venous thrombosis has resolved.   Electronically Signed   By: Burman Nieves M.D.   On:  11/02/2014 23:59   Ct Angio Abd/pel W/ And/or W/o  11/03/2014   CLINICAL DATA:  Severe left lower quadrant pain, and nausea. Patient with portal venous and cerebral venous thrombosis.  EXAM: CT ANGIOGRAPHY ABDOMEN AND PELVIS WITH CONTRAST AND WITHOUT CONTRAST  TECHNIQUE: Multidetector CT imaging of the abdomen and pelvis was performed using the standard protocol during bolus administration of intravenous contrast. Multiplanar reconstructed images including MIPs were obtained and reviewed to evaluate the vascular anatomy.  CONTRAST:  100 cc Omnipaque 350.  COMPARISON:  CT of the abdomen pelvis dated 11/02/2014  FINDINGS: Arterial findings  Aorta  Normal in caliber, without filling defects or stenosis.  Celiac axis  Normal in caliber, without filling defects or stenosis.  Superior mesenteric  Normal in caliber, without filling defects or stenosis.  Left renal  Normal in caliber, without filling defects or stenosis. Accessory left renal artery is noted.  Right renal  Normal in caliber, without filling defects or stenosis.  Inferior mesenteric  Normal in caliber, without filling defects or stenosis.  Left iliac  Normal in caliber, without filling defects or stenosis.  Right iliac  Normal in caliber, without filling defects or stenosis.  Venous findings  No evidence of portal venous thrombosis.  Review of the MIP images confirms the above findings.  Nonvascular findings  There is mesenteric stranding and small amount of free fluid within the mid left abdomen. There is also a long segment of bowel thickening of the distal third of the ileum. The distal most ileum is noted to be distended and fluid-filled with maximum diameter of 2.7 cm. The terminal ileum is prominent. The appendix is normal. The remainder of the upper GI tract and colon are normal. Multiple sub cm rounded mesenteric lymph nodes are seen within the same area of the abdomen. There is a tiny amount of free fluid, tracking along the left pericolic gutter.  There is a small amount of free fluid within the pelvis as well.  Focal fatty infiltration along the falciform ligament of the liver is noted, otherwise the liver appears normal. The spleen, pancreas, bilateral adrenal glands, bilateral kidneys are normal. Vicarious excretion of contrast within the otherwise normal gallbladder is noted.  IMPRESSION: No evidence of a arterial or venous thrombosis.  Increased mesenteric stranding with interspersed shotty mesenteric lymph nodes within the mid left abdomen, in the area of long segment of thickening of the distal ileum. Thickening of the terminal ileum and a focal distention of up to 2.7 cm of the distal ileum leading to it. These findings may represent infectious, inflammatory or ischemic bowel changes, with associated reactive lymphadenopathy. Alternatively mesenteric adenitis is of consideration.   Electronically Signed   By: Ted Mcalpine M.D.   On: 11/03/2014 11:42    Anti-infectives: Anti-infectives    None       Assessment/Plan  1.  Abdominal pain 2. Ileitis 3. Recent portal vein thrombus, now resolved  -patient's patient is still present, but her  exam seems relatively benign this morning. -would continue supportive care.  I do not see any evidence of surgical needs at this time.  Labs and vitals all still remain normal -will follow  LOS: 1 day    Victoria Alvarez E 11/04/2014, 8:23 AM Pager: [phone]

## 2014-11-04 NOTE — Progress Notes (Signed)
Patient has been informed that she has a clear liquid diet. While checking on the patient she had [employer] at bedside and stated, "I am hungry and I am going to eat something." I spoke with the patient about the clear liquid diet and notified the MD.

## 2014-11-04 NOTE — Discharge Instructions (Signed)
Information on my medicine - ELIQUIS (apixaban)  This medication education was reviewed with me or my healthcare representative as part of my discharge preparation.  The pharmacist that spoke with me during my hospital stay was:  Almon Hercules, [facility]  Why was Eliquis prescribed for you? Eliquis was prescribed for you to reduce the risk of forming blood clots that can cause a stroke if you have a medical condition called atrial fibrillation (a type of irregular heartbeat) OR to reduce the risk of a blood clots forming after orthopedic surgery.  What do You need to know about Eliquis ? Take your Eliquis TWICE DAILY - one tablet in the morning and one tablet in the evening with or without food.  It would be best to take the doses about the same time each day.  If you have difficulty swallowing the tablet whole please discuss with your pharmacist how to take the medication safely.  Take Eliquis exactly as prescribed by your doctor and DO NOT stop taking Eliquis without talking to the doctor who prescribed the medication.  Stopping may increase your risk of developing a new clot or stroke.  Refill your prescription before you run out.  After discharge, you should have regular check-up appointments with your healthcare provider that is prescribing your Eliquis.  In the future your dose may need to be changed if your kidney function or weight changes by a significant amount or as you get older.  What do you do if you miss a dose? If you miss a dose, take it as soon as you remember on the same day and resume taking twice daily.  Do not take more than one dose of ELIQUIS at the same time.  Important Safety Information A possible side effect of Eliquis is bleeding. You should call your healthcare provider right away if you experience any of the following: ? Bleeding from an injury or your nose that does not stop. ? Unusual colored urine (red or dark brown) or unusual colored stools (red or  black). ? Unusual bruising for unknown reasons. ? A serious fall or if you hit your head (even if there is no bleeding).  Some medicines may interact with Eliquis and might increase your risk of bleeding or clotting while on Eliquis. To help avoid this, consult your healthcare provider or pharmacist prior to using any new prescription or non-prescription medications, including herbals, vitamins, non-steroidal anti-inflammatory drugs (NSAIDs) and supplements.  This website has more information on Eliquis (apixaban): [email or web address].com.cy.

## 2014-11-04 NOTE — Progress Notes (Signed)
Discussed with the patient to continue with the Clear liquid diet. Per MD patient can advance to reg diet 11/05/14.

## 2014-11-05 DIAGNOSIS — F1120 Opioid dependence, uncomplicated: Secondary | ICD-10-CM

## 2014-11-05 DIAGNOSIS — R569 Unspecified convulsions: Secondary | ICD-10-CM

## 2014-11-05 LAB — GLUCOSE, CAPILLARY: Glucose-Capillary: 104 mg/dL — ABNORMAL HIGH (ref 65–99)

## 2014-11-05 LAB — LEVETIRACETAM LEVEL: Levetiracetam Lvl: NOT DETECTED ug/mL (ref 10.0–40.0)

## 2014-11-05 MED ORDER — ONDANSETRON HCL 4 MG/2ML IJ SOLN
4.0000 mg | Freq: Four times a day (QID) | INTRAMUSCULAR | Status: DC | PRN
  Administered 2014-11-05 – 2014-11-06 (×4): 4 mg via INTRAVENOUS
  Filled 2014-11-05 (×4): qty 2

## 2014-11-05 MED ORDER — PIPERACILLIN-TAZOBACTAM 3.375 G IVPB
3.3750 g | Freq: Three times a day (TID) | INTRAVENOUS | Status: DC
  Administered 2014-11-05 – 2014-11-06 (×3): 3.375 g via INTRAVENOUS
  Filled 2014-11-05 (×5): qty 50

## 2014-11-05 MED ORDER — FLEET ENEMA 7-19 GM/118ML RE ENEM: 1.0000 | ENEMA | Freq: Every day | RECTAL | Status: DC | PRN

## 2014-11-05 MED ORDER — PIPERACILLIN-TAZOBACTAM 3.375 G IVPB 30 MIN
3.3750 g | Freq: Once | INTRAVENOUS | Status: AC
  Administered 2014-11-05: 3.375 g via INTRAVENOUS
  Filled 2014-11-05: qty 50

## 2014-11-05 MED ORDER — POLYETHYLENE GLYCOL 3350 17 G PO PACK
17.0000 g | PACK | Freq: Two times a day (BID) | ORAL | Status: DC
  Administered 2014-11-06: 17 g via ORAL
  Filled 2014-11-05 (×2): qty 1

## 2014-11-05 NOTE — Progress Notes (Signed)
Subjective: Says she had a bad night, still complaining of pain.  No nausea, she was trying to eat real food last PM.  + BM, no blood.  Objective: Vital signs in last 24 hours: Temp:  [97.4 F (36.3 C)-97.6 F (36.4 C)] 97.4 F (36.3 C) (08/27 0559) Pulse Rate:  [53-65] 53 (08/27 0559) Resp:  [12-15] 12 (08/27 0559) BP: (95-105)/(51-55) 95/51 mmHg (08/27 0559) SpO2:  [97 %] 97 % (08/27 0559) Last BM Date: 11/02/14   PO 1020  Clear diet  Reported to be eating Brendolyn Patty Afebrile, VSS BP mostily in upper 90's NO labs No films Intake/Output from previous day: 08/26 0701 - 08/27 0700 In: 2326.7 [P.O.:1020; I.V.:1306.7] Out: 3800 [Urine:3800] Intake/Output this shift:    General appearance: alert and no distress GI: soft, + BS, tender more on the right side than left today.  Lab Results:   Recent Labs  11/02/14 0924 11/03/14 0451  WBC 6.9 5.0  HGB 13.3 10.6*  HCT 42.5 35.1*  PLT 222 198    BMET  Recent Labs  11/02/14 0924 11/03/14 0451  NA 136 137  K 3.9 4.3  CL 105 111  CO2 24 21*  GLUCOSE 90 89  BUN 14 12  CREATININE 0.83 0.70  CALCIUM 8.8* 7.5*   PT/INR  Recent Labs  11/03/14 0451  LABPROT 15.8*  INR 1.24     Recent Labs Lab 11/02/14 0924 11/03/14 0451  AST 21 21  ALT 25 18  ALKPHOS 47 37*  BILITOT 0.5 0.5  PROT 5.0* 3.8*  ALBUMIN 3.1* 2.3*     Lipase     Component Value Date/Time   LIPASE 33 11/02/2014 2202     Studies/Results: Ct Angio Abd/pel W/ And/or W/o  11/03/2014   CLINICAL DATA:  Severe left lower quadrant pain, and nausea. Patient with portal venous and cerebral venous thrombosis.  EXAM: CT ANGIOGRAPHY ABDOMEN AND PELVIS WITH CONTRAST AND WITHOUT CONTRAST  TECHNIQUE: Multidetector CT imaging of the abdomen and pelvis was performed using the standard protocol during bolus administration of intravenous contrast. Multiplanar reconstructed images including MIPs were obtained and reviewed to evaluate the vascular anatomy.   CONTRAST:  100 cc Omnipaque 350.  COMPARISON:  CT of the abdomen pelvis dated 11/02/2014  FINDINGS: Arterial findings  Aorta  Normal in caliber, without filling defects or stenosis.  Celiac axis  Normal in caliber, without filling defects or stenosis.  Superior mesenteric  Normal in caliber, without filling defects or stenosis.  Left renal  Normal in caliber, without filling defects or stenosis. Accessory left renal artery is noted.  Right renal  Normal in caliber, without filling defects or stenosis.  Inferior mesenteric  Normal in caliber, without filling defects or stenosis.  Left iliac  Normal in caliber, without filling defects or stenosis.  Right iliac  Normal in caliber, without filling defects or stenosis.  Venous findings  No evidence of portal venous thrombosis.  Review of the MIP images confirms the above findings.  Nonvascular findings  There is mesenteric stranding and small amount of free fluid within the mid left abdomen. There is also a long segment of bowel thickening of the distal third of the ileum. The distal most ileum is noted to be distended and fluid-filled with maximum diameter of 2.7 cm. The terminal ileum is prominent. The appendix is normal. The remainder of the upper GI tract and colon are normal. Multiple sub cm rounded mesenteric lymph nodes are seen within the same area of the abdomen.  There is a tiny amount of free fluid, tracking along the left pericolic gutter. There is a small amount of free fluid within the pelvis as well.  Focal fatty infiltration along the falciform ligament of the liver is noted, otherwise the liver appears normal. The spleen, pancreas, bilateral adrenal glands, bilateral kidneys are normal. Vicarious excretion of contrast within the otherwise normal gallbladder is noted.  IMPRESSION: No evidence of a arterial or venous thrombosis.  Increased mesenteric stranding with interspersed shotty mesenteric lymph nodes within the mid left abdomen, in the area of long  segment of thickening of the distal ileum. Thickening of the terminal ileum and a focal distention of up to 2.7 cm of the distal ileum leading to it. These findings may represent infectious, inflammatory or ischemic bowel changes, with associated reactive lymphadenopathy. Alternatively mesenteric adenitis is of consideration.   Electronically Signed   By: Fidela Salisbury M.D.   On: 11/03/2014 11:42    Medications: . apixaban  5 mg Oral BID  . famotidine  20 mg Oral Daily  . FLUoxetine  20 mg Oral Daily  . levETIRAcetam  500 mg Intravenous Once  . levETIRAcetam  500 mg Oral BID  . methadone  85 mg Oral Q breakfast  . nicotine  21 mg Transdermal Daily  . sodium chloride  1,500 mL Intravenous Once  . 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 Post partum, 6 months Antibiotics:  None DVT: Apixaban/SCD    Plan:  She does not have an acute abdomen, Will start some Zosyn for possible illeitis, she has had issues with Cipro in the past.  See if this helps.  Recheck labs in AM.    LOS: 2 days    Khyre Germond 11/05/2014

## 2014-11-05 NOTE — Progress Notes (Signed)
PATIENT DETAILS Name: Victoria Alvarez Age: 24 y.o. Sex: female Date of Birth: 1990-05-04 Admit Date: 11/02/2014 Admitting Physician Ivor Costa, MD [id number], Lennox Laity, MD  Subjective: Continues to have abdominal pain. Had a BM last night.  Assessment/Plan: Principal Problem: Abdominal pain:?ileitis. Recent history of acute portal vein thrombosis and bowel ischemia-however repeat CT scan on 8/25 negative for any new thromboses. Had Bowel movement last night, abdomen is soft but with diffuse tenderness. General surgery recommending starting Zosyn. Follow.  Active Problems: Recent hx of Left portal venous sinus thrombosis: Continue Eliquis  History of cerebral venous sinus thrombosis:Continue Eliquis  Chronic Constipation: Continue scheduled MiraLAX  Chronic pain/history of interstitial cystitis: Continue with methadone.  History of seizure disorder: Continue Keppra  History of tobacco abuse: Counseled, continue transdermal nicotine  History of depression: Appears stable, denies any suicidal/homicidal ideation. Continue Prozac  Disposition: Remain inpatient  Antimicrobial agents  See below  Anti-infectives    Start     Dose/Rate Route Frequency Ordered Stop   11/05/14 1400  piperacillin-tazobactam (ZOSYN) IVPB 3.375 g     3.375 g 12.5 mL/hr over 240 Minutes Intravenous 3 times per day 11/05/14 0800     11/05/14 0815  piperacillin-tazobactam (ZOSYN) IVPB 3.375 g     3.375 g 100 mL/hr over 30 Minutes Intravenous  Once 11/05/14 0802 11/05/14 0908      DVT Prophylaxis: Eliquis  Code Status: Full code   Family Communication None at bedside  Procedures: None  CONSULTS:  GI and general surgery  Time spent 20 minutes-Greater than 50% of this time was spent in counseling, explanation of diagnosis, planning of further management, and coordination of care.  MEDICATIONS: Scheduled Meds: . apixaban  5 mg Oral BID  . famotidine  20 mg Oral Daily    . FLUoxetine  20 mg Oral Daily  . levETIRAcetam  500 mg Intravenous Once  . levETIRAcetam  500 mg Oral BID  . methadone  85 mg Oral Q breakfast  . nicotine  21 mg Transdermal Daily  . piperacillin-tazobactam (ZOSYN)  IV  3.375 g Intravenous 3 times per day  . sodium chloride  1,500 mL Intravenous Once  . sodium chloride  3 mL Intravenous Q12H   Continuous Infusions: . sodium chloride 75 mL/hr at 11/05/14 1041   PRN Meds:.acetaminophen **OR** acetaminophen, HYDROmorphone (DILAUDID) injection, magnesium hydroxide, ondansetron, polyethylene glycol, sodium phosphate    PHYSICAL EXAM: Vital signs in last 24 hours: Filed Vitals:   11/03/14 1437 11/04/14 0546 11/04/14 2204 11/05/14 0559  BP: 97/50 93/47 105/55 95/51  Pulse: 67 65 65 53  Temp: 98 F (36.7 C) 99.2 F (37.3 C) 97.6 F (36.4 C) 97.4 F (36.3 C)  TempSrc: Oral Oral Oral Oral  Resp: 20 12 15 12   SpO2: 91% 97% 97% 97%    Weight change:  There were no vitals filed for this visit. There is no weight on file to calculate BMI.   Gen Exam: Awake and alert with clear speech.   Neck: Supple, No JVD.  Chest: B/L Clear.   CVS: S1 S2 Regular, no murmurs.  Abdomen: soft, BS +,but diffusely tender-without rebound, non distended. Extremities: no edema, lower extremities warm to touch. Neurologic: Non Focal.   Skin: No Rash.   Wounds: N/A.    Intake/Output from previous day:  Intake/Output Summary (Last 24 hours) at 11/05/14 1138 Last data filed at 11/05/14 0905  Gross per 24 hour  Intake 981.67 ml  Output   2400 ml  Net -1418.33 ml     LAB RESULTS: CBC  Recent Labs Lab 11/02/14 0924 11/03/14 0451  WBC 6.9 5.0  HGB 13.3 10.6*  HCT 42.5 35.1*  PLT 222 198  MCV 71.9* 72.1*  MCH 22.5* 21.8*  MCHC 31.3 30.2  RDW 28.6* 28.6*    Chemistries   Recent Labs Lab 11/02/14 0924 11/03/14 0451  NA 136 137  K 3.9 4.3  CL 105 111  CO2 24 21*  GLUCOSE 90 89  BUN 14 12  CREATININE 0.83 0.70  CALCIUM 8.8*  7.5*    CBG:  Recent Labs Lab 11/03/14 1046 11/04/14 0751 11/05/14 0741  GLUCAP 78 92 104*    GFR CrCl cannot be calculated (Unknown ideal weight.).  Coagulation profile  Recent Labs Lab 11/03/14 0451  INR 1.24    Cardiac Enzymes No results for input(s): CKMB, TROPONINI, MYOGLOBIN in the last 168 hours.  Invalid input(s): CK  Invalid input(s): POCBNP No results for input(s): DDIMER in the last 72 hours. No results for input(s): HGBA1C in the last 72 hours. No results for input(s): CHOL, HDL, LDLCALC, TRIG, CHOLHDL, LDLDIRECT in the last 72 hours. No results for input(s): TSH, T4TOTAL, T3FREE, THYROIDAB in the last 72 hours.  Invalid input(s): FREET3 No results for input(s): VITAMINB12, FOLATE, FERRITIN, TIBC, IRON, RETICCTPCT in the last 72 hours.  Recent Labs  11/02/14 2202  LIPASE 33    Urine Studies No results for input(s): UHGB, CRYS in the last 72 hours.  Invalid input(s): UACOL, UAPR, USPG, UPH, UTP, UGL, UKET, UBIL, UNIT, UROB, ULEU, UEPI, UWBC, URBC, UBAC, CAST, UCOM, BILUA  MICROBIOLOGY: No results found for this or any previous visit (from the past 240 hour(s)).  RADIOLOGY STUDIES/RESULTS: Ct Abdomen Pelvis Wo Contrast  10/08/2014   CLINICAL DATA:  Generalized abdominal pain, greatest in the left lower quadrant.  EXAM: CT ABDOMEN AND PELVIS WITHOUT CONTRAST  TECHNIQUE: Multidetector CT imaging of the abdomen and pelvis was performed following the standard protocol without IV contrast.  COMPARISON:  07/17/2013  FINDINGS: There is pneumatosis of the cecum and ascending colon with a generous volume of air in the wall. There also is a little air which appears to extend toward the undersurface of the gallbladder and liver, perhaps in the hepatoduodenal ligament. There is a small bubble of air in the porta hepatis which may reside within the portal vein. No frank free intraperitoneal air is evident. Despite the rather generous volume of pneumatosis, there is  no significant inflammatory change about the cecum or ascending colon. There is no bowel obstruction. Appendix is normal.  There are unremarkable unenhanced appearances of the liver, spleen, pancreas, adrenals and kidneys. Uterus and ovaries appear normal. There is no significant abnormality in the lower chest. There is no significant musculoskeletal abnormality.  IMPRESSION: Pneumatosis of the cecum and ascending colon. There are a few bubbles of air extending up toward the under surface of the gallbladder but the air does not appear to be free in the peritoneum. There also may be a bubble of air in the portal vein. Despite the generous volume of extraluminal air, there is little or no inflammatory change. There is no obstruction. There is no ascites. These results were called by telephone at the time of interpretation on 10/08/2014 at 2:51 am to Dr. Dina Rich, who verbally acknowledged these results.   Electronically Signed   By: Andreas Newport M.D.   On: 10/08/2014 02:57  Dg Chest 2 View  10/06/2014   CLINICAL DATA:  Abdominal pain and shortness of breath for 5 months.  EXAM: CHEST  2 VIEW  COMPARISON:  Chest radiograph 06/12/2014  FINDINGS: The heart is normal in size. Mild diffuse bronchial thickening. Pulmonary vasculature is normal. No consolidation, pleural effusion, or pneumothorax. No acute osseous abnormalities are seen.  IMPRESSION: Mild diffuse bronchial thickening, can be seen with asthma or bronchitis.   Electronically Signed   By: Jeb Levering M.D.   On: 10/06/2014 21:29   Ct Abdomen Pelvis W Contrast  11/02/2014   CLINICAL DATA:  Abdominal pain since leaving the hospital a few weeks ago. 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  EXAM: CT ABDOMEN AND PELVIS WITH CONTRAST  TECHNIQUE: Multidetector CT imaging of the abdomen and pelvis was performed using the standard protocol following bolus administration of intravenous contrast.  CONTRAST:  136m OMNIPAQUE IOHEXOL 300 MG/ML  SOLN   COMPARISON:  10/11/2014  FINDINGS: 3 mm nodule in the left lung base without change since prior study.  Small focal fatty infiltration in the liver adjacent to the falciform ligament. Tiny sub cm low-attenuation lesion in the medial segment left lobe unchanged since prior study. This too small to characterize but probably represents a cyst or hemangioma. No other focal liver lesions. Gallbladder, spleen, pancreas, adrenal glands, kidneys, abdominal aorta, inferior vena cava, and retroperitoneal lymph nodes are unremarkable. Stomach, small bowel, and colon are not abnormally distended. Residual thickening of the wall of the distal ileum. This could represent residua of previous vascular compromise or inflammatory change. Visualized portions of the superior mesenteric artery and vein appear patent. No significant residual portal venous thrombosis. Small amount of free fluid in the lower abdominal mesenteric in the area of thickened bowel. No pneumatosis or portal venous gas. No free air. Mild prominence of mesenteric lymph nodes, probably reactive.  Pelvis: Small amount of free fluid in the pelvis could be reactive or physiologic. Uterus and ovaries are not enlarged. Mild bladder wall thickening may be due to under distention or cystitis. Appendix is normal. No pelvic mass or lymphadenopathy. No destructive bone lesions.  IMPRESSION: Residual bowel wall thickening in the low abdomen with small amount of adjacent free fluid. This could represent residual vascular compromise or inflammatory process. The previous portal venous thrombosis has resolved.   Electronically Signed   By: WLucienne CapersM.D.   On: 11/02/2014 23:59   Ct Abdomen Pelvis W Contrast  10/11/2014   CLINICAL DATA:  Abdominal pain. History of hernia repair, pyelonephritis, seizures, CVA and substance abuse. Patient discontinued Eliquis therapy prior to admission. Initial encounter.  EXAM: CT ABDOMEN AND PELVIS WITH CONTRAST  TECHNIQUE: Multidetector  CT imaging of the abdomen and pelvis was performed using the standard protocol following bolus administration of intravenous contrast.  CONTRAST:  100 ml Omnipaque 300.  COMPARISON:  Abdominal CT 10/08/2014 and 07/17/2013  FINDINGS: Lower chest: Increased volume loss, ground-glass opacity and linear densities within the right middle and lower lobes may reflect atelectasis or infection. Small right pleural effusion.  Hepatobiliary: There is left portal vein thrombosis with scattered low-density throughout the left hepatic lobe. No suspicious hepatic lesions demonstrated. The right and main portal veins are patent. No evidence of gallstones, gallbladder wall thickening or biliary dilatation.  Pancreas: Unremarkable. No pancreatic ductal dilatation or surrounding inflammatory changes.  Spleen: Normal in size without focal abnormality.  Adrenals/Urinary Tract: Both adrenal glands appear normal.The kidneys appear normal without evidence of urinary tract calculus, suspicious  lesion or hydronephrosis. No bladder abnormalities are seen.  Stomach/Bowel: The stomach and proximal small bowel appear normal. There is distal small bowel wall thickening with mild surrounding inflammatory change. There is also mild wall thickening of the cecum. No residual pneumatosis identified.The appendix appears normal. There is small amount of free fluid in the right upper pelvis.  Vascular/Lymphatic: There are no enlarged abdominal or pelvic lymph nodes. As above, there is left portal vein thrombosis. The right and main portal veins are patent. The superior mesenteric and splenic veins are patent. No significant arterial findings.  Reproductive: Unremarkable.  Other: Postsurgical changes in the left groin without evidence of recurrent hernia.  Musculoskeletal: No acute or significant osseous findings.  IMPRESSION: 1. Occlusive left portal vein thrombosis, likely secondary to discontinued anti coagulation. The main portal, superior mesenteric  and splenic veins are patent. 2. Mild wall thickening of the terminal ileum and cecum with surrounding inflammation, possibly secondary to infectious enteritis, vasculitis or venous thrombosis. No evidence of arterial occlusion, perforation or abscess. 3. Increased predominate linear opacity at the right lung base, likely atelectasis. 4. These results were called by telephone at the time of interpretation on 10/11/2014 at 2:00 pm to Dr. Lala Lund , who verbally acknowledged these results.   Electronically Signed   By: Richardean Sale M.D.   On: 10/11/2014 14:04   Dg Abd 2 Views  10/14/2014   CLINICAL DATA:  24 year old female with severe mid abdominal pain. Reported normal bowel movements.  EXAM: ABDOMEN - 2 VIEW  COMPARISON:  10/13/2014 abdominal radiographs.  FINDINGS: Multiple surgical clips overlie the left lower pelvis. No residual air-fluid levels are seen in the abdomen. There is minimal gas in the small bowel, with no dilated small bowel loops. There is normal colonic gas, with no appreciable stool. No pneumatosis, pneumoperitoneum or pathologic soft tissue calcifications. Visualized osseous structures appear intact.  IMPRESSION: Nonobstructive bowel gas pattern. Resolved air-fluid levels. No free intraperitoneal air.   Electronically Signed   By: Ilona Sorrel M.D.   On: 10/14/2014 09:30   Dg Abd 2 Views  10/13/2014   CLINICAL DATA:  Abdominal pain. History of left portal vein thrombosis  EXAM: ABDOMEN - 2 VIEW  COMPARISON:  CT abdomen and pelvis October 11, 2014  FINDINGS: Supine and upright abdomen images were obtained. There is a paucity of small bowel gas. Colon is not dilated. There are multiple air-fluid levels on the upright image. No free air. There is postoperative change in the left pelvis. There are phleboliths in left pelvis is well. The lung bases are clear.  IMPRESSION: Multiple air-fluid levels. Paucity of small bowel gas. These findings suggest enteritis or a degree of ileus. Obstruction  is possible but felt to be somewhat less likely. No free air apparent.   Electronically Signed   By: Lowella Grip III M.D.   On: 10/13/2014 13:46   Dg Abd 2 Views  10/09/2014   CLINICAL DATA:  Generalized abdominal pain. Nausea vomiting diarrhea for 2 days  EXAM: ABDOMEN - 2 VIEW  COMPARISON:  CT 10/06/2014, radiograph 10/06/2014  FINDINGS: The oral contrast from CT of 10/06/2014 has progressed into the rectum. There is mild decrease in the volume of stool seen on comparison exam. No dilated loops of large or small bowel. NO intraoperative free air.  IMPRESSION: No evidence of bowel obstruction. Progression of oral contrast the rectum from CT of 10/06/2014.   Electronically Signed   By: Suzy Bouchard M.D.   On: 10/09/2014 09:33  Dg Abd 2 Views  10/06/2014   CLINICAL DATA:  Abdominal pain and diarrhea for 5 months.  EXAM: ABDOMEN - 2 VIEW  COMPARISON:  07/13/2014  FINDINGS: There is no free intra-abdominal air. No dilated bowel loops to suggest obstruction. Moderate volume of stool throughout the colon. No radiopaque calculi. Pelvic phleboliths and surgical clips projecting over the left inguinal region, unchanged. There is chronic widening of the pubic symphysis. No acute osseous abnormalities are seen.  IMPRESSION: Normal abdominal radiographs with moderate volume of colonic stool.   Electronically Signed   By: Jeb Levering M.D.   On: 10/06/2014 21:32   Dg Abd Portable 1v  10/11/2014   CLINICAL DATA:  Generalized abdominal pain, nausea, vomiting and constipation, over the past 4 days. Initial encounter.  EXAM: PORTABLE ABDOMEN - 1 VIEW  COMPARISON:  Abdominal radiograph performed 10/09/2014  FINDINGS: The colon contains a small amount of air but is otherwise empty. No bowel dilatation is seen to suggest obstruction. No free intra-abdominal air is seen, though evaluation for free air is limited on a single supine view.  No acute osseous abnormalities are identified.  IMPRESSION: Colon contains a  small amount of air but is otherwise empty. No evidence for bowel obstruction. No radiographic evidence to suggest constipation.   Electronically Signed   By: Garald Balding M.D.   On: 10/11/2014 00:36   Ct Angio Abd/pel W/ And/or W/o  11/03/2014   CLINICAL DATA:  Severe left lower quadrant pain, and nausea. Patient with portal venous and cerebral venous thrombosis.  EXAM: CT ANGIOGRAPHY ABDOMEN AND PELVIS WITH CONTRAST AND WITHOUT CONTRAST  TECHNIQUE: Multidetector CT imaging of the abdomen and pelvis was performed using the standard protocol during bolus administration of intravenous contrast. Multiplanar reconstructed images including MIPs were obtained and reviewed to evaluate the vascular anatomy.  CONTRAST:  100 cc Omnipaque 350.  COMPARISON:  CT of the abdomen pelvis dated 11/02/2014  FINDINGS: Arterial findings  Aorta  Normal in caliber, without filling defects or stenosis.  Celiac axis  Normal in caliber, without filling defects or stenosis.  Superior mesenteric  Normal in caliber, without filling defects or stenosis.  Left renal  Normal in caliber, without filling defects or stenosis. Accessory left renal artery is noted.  Right renal  Normal in caliber, without filling defects or stenosis.  Inferior mesenteric  Normal in caliber, without filling defects or stenosis.  Left iliac  Normal in caliber, without filling defects or stenosis.  Right iliac  Normal in caliber, without filling defects or stenosis.  Venous findings  No evidence of portal venous thrombosis.  Review of the MIP images confirms the above findings.  Nonvascular findings  There is mesenteric stranding and small amount of free fluid within the mid left abdomen. There is also a long segment of bowel thickening of the distal third of the ileum. The distal most ileum is noted to be distended and fluid-filled with maximum diameter of 2.7 cm. The terminal ileum is prominent. The appendix is normal. The remainder of the upper GI tract and colon  are normal. Multiple sub cm rounded mesenteric lymph nodes are seen within the same area of the abdomen. There is a tiny amount of free fluid, tracking along the left pericolic gutter. There is a small amount of free fluid within the pelvis as well.  Focal fatty infiltration along the falciform ligament of the liver is noted, otherwise the liver appears normal. The spleen, pancreas, bilateral adrenal glands, bilateral kidneys are normal. Vicarious  excretion of contrast within the otherwise normal gallbladder is noted.  IMPRESSION: No evidence of a arterial or venous thrombosis.  Increased mesenteric stranding with interspersed shotty mesenteric lymph nodes within the mid left abdomen, in the area of long segment of thickening of the distal ileum. Thickening of the terminal ileum and a focal distention of up to 2.7 cm of the distal ileum leading to it. These findings may represent infectious, inflammatory or ischemic bowel changes, with associated reactive lymphadenopathy. Alternatively mesenteric adenitis is of consideration.   Electronically Signed   By: Fidela Salisbury M.D.   On: 11/03/2014 11:42    Oren Binet, MD  [facility] Pager:[phone]  If 7PM-7AM, please contact night-coverage [email or web address] Password [facility] 11/05/2014, 11:38 AM   LOS: 2 days

## 2014-11-06 DIAGNOSIS — G08 Intracranial and intraspinal phlebitis and thrombophlebitis: Secondary | ICD-10-CM

## 2014-11-06 DIAGNOSIS — F329 Major depressive disorder, single episode, unspecified: Secondary | ICD-10-CM

## 2014-11-06 LAB — CBC
HCT: 33 % — ABNORMAL LOW (ref 36.0–46.0)
Hemoglobin: 10.3 g/dL — ABNORMAL LOW (ref 12.0–15.0)
MCH: 22.7 pg — ABNORMAL LOW (ref 26.0–34.0)
MCHC: 31.2 g/dL (ref 30.0–36.0)
MCV: 72.7 fL — ABNORMAL LOW (ref 78.0–100.0)
Platelets: 170 10*3/uL (ref 150–400)
RBC: 4.54 MIL/uL (ref 3.87–5.11)
RDW: 27.4 % — ABNORMAL HIGH (ref 11.5–15.5)
WBC: 4.4 10*3/uL (ref 4.0–10.5)

## 2014-11-06 LAB — BASIC METABOLIC PANEL
Anion gap: 6 (ref 5–15)
BUN: <5 mg/dL — ABNORMAL LOW (ref 6–20)
CO2: 24 mmol/L (ref 22–32)
Calcium: 7.8 mg/dL — ABNORMAL LOW (ref 8.9–10.3)
Chloride: 108 mmol/L (ref 101–111)
Creatinine, Ser: 0.95 mg/dL (ref 0.44–1.00)
GFR calc Af Amer: >60 mL/min (ref >60)
GFR calc non Af Amer: >60 mL/min (ref >60)
Glucose, Bld: 92 mg/dL (ref 65–99)
Potassium: 3.8 mmol/L (ref 3.5–5.1)
Sodium: 138 mmol/L (ref 135–145)

## 2014-11-06 LAB — GLUCOSE, CAPILLARY: Glucose-Capillary: 90 mg/dL (ref 65–99)

## 2014-11-06 MED ORDER — POLYETHYLENE GLYCOL 3350 17 G PO PACK: 17.0000 g | PACK | Freq: Every day | ORAL | Status: DC

## 2014-11-06 MED ORDER — METOCLOPRAMIDE HCL 5 MG PO TABS: 5.0000 mg | ORAL_TABLET | Freq: Four times a day (QID) | ORAL | Status: DC | PRN

## 2014-11-06 MED ORDER — AMOXICILLIN-POT CLAVULANATE 875-125 MG PO TABS: 1.0000 | ORAL_TABLET | Freq: Two times a day (BID) | ORAL | Status: DC

## 2014-11-06 MED ORDER — LINACLOTIDE 145 MCG PO CAPS
145.0000 ug | ORAL_CAPSULE | Freq: Every day | ORAL | Status: DC
  Administered 2014-11-06: 145 ug via ORAL
  Filled 2014-11-06: qty 1

## 2014-11-06 MED ORDER — LINACLOTIDE 145 MCG PO CAPS: 145.0000 ug | ORAL_CAPSULE | Freq: Every day | ORAL | Status: DC

## 2014-11-06 NOTE — Progress Notes (Signed)
  Subjective: No real change, she still complains of the same pain. She had 7 doses of dilaudid yesterday, 2 so far today.  Objective: Vital signs in last 24 hours: Temp:  [97.5 F (36.4 C)-98.5 F (36.9 C)] 97.6 F (36.4 C) (08/28 0553) Pulse Rate:  [50-69] 56 (08/28 0600) Resp:  [16] 16 (08/28 0553) BP: (81-107)/(40-62) 94/60 mmHg (08/28 0600) SpO2:  [97 %-98 %] 98 % (08/28 0553) Last BM Date: 11/05/14 (per pt) 240 PO recorded Urine 900 Soft diet Afebrile, VSS Labs are normal Intake/Output from previous day: 08/27 0701 - 08/28 0700 In: 240 [P.O.:240] Out: 900 [Urine:900] Intake/Output this shift:    General appearance: cooperative, no distress and sleeping when I came in GI: soft, she complains of some tenderness when i first touched her, abdomen is soft, no one place seems more tender right now than another.  + BS, she reports multiple BM's yesterday.  Lab Results:   Recent Labs  11/06/14 0620  WBC 4.4  HGB 10.3*  HCT 33.0*  PLT 170    BMET  Recent Labs  11/06/14 0620  NA 138  K 3.8  CL 108  CO2 24  GLUCOSE 92  BUN <5*  CREATININE 0.95  CALCIUM 7.8*   PT/INR No results for input(s): LABPROT, INR in the last 72 hours.   Recent Labs Lab 11/02/14 0924 11/03/14 0451  AST 21 21  ALT 25 18  ALKPHOS 47 37*  BILITOT 0.5 0.5  PROT 5.0* 3.8*  ALBUMIN 3.1* 2.3*     Lipase     Component Value Date/Time   LIPASE 33 11/02/2014 2202     Studies/Results: No results found.  Medications: . apixaban  5 mg Oral BID  . famotidine  20 mg Oral Daily  . FLUoxetine  20 mg Oral Daily  . levETIRAcetam  500 mg Oral BID  . Linaclotide  145 mcg Oral Daily  . methadone  85 mg Oral Q breakfast  . nicotine  21 mg Transdermal Daily  . piperacillin-tazobactam (ZOSYN)  IV  3.375 g Intravenous 3 times per day  . [START ON 11/07/2014] polyethylene glycol  17 g Oral Daily  . sodium chloride  1,500 mL Intravenous Once  . sodium chloride  3 mL Intravenous Q12H     Assessment/Plan Recurrent abdominal pain Recent admission for pain with some cecal pneumatosis, and portal vein thrombosis, on chronic anticoagulation Possible illeitis Chronic abdominal pain/interstitial cystitis on Methadone, chronic opoid use since age 20 Hx of constipation secondary to opoid/Methadone use. 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 Post partum, 6 months Antibiotics: day 2 Zosyn DVT: Apixaban/SCD   Plan:  No acute surgical issues, pain issues are the same, day 2 of antibiotics for possible ileitis.  I would transition her to her preadmission  PO pain med regime, and use dilaudid as supplement as needed.      LOS: 3 days    Victoria Alvarez 11/06/2014

## 2014-11-06 NOTE — Progress Notes (Signed)
PATIENT DETAILS Name: Victoria Alvarez Age: 24 y.o. Sex: female Date of Birth: [birth year] Admit Date: 11/02/2014 Admitting Physician Ivor Costa, MD YIR:SWNIOEV, Lennox Laity, MD  Subjective: Multiple BMs yesterday-seems more comfortable today. Wants to eat regular food.  Assessment/Plan: Principal Problem: Abdominal pain:?ileitis. Recent history of acute portal vein thrombosis and bowel ischemia-however repeat CT scan on 8/25 negative for any new thromboses. Multiple bowel movements last night-advance diet to soft, start linzess. Abdomen is soft but with mild diffuse tenderness. Continue Zosyn for now. Follow.  Active Problems: Recent hx of Left portal venous sinus thrombosis: Continue Eliquis  History of cerebral venous sinus thrombosis:Continue Eliquis  Chronic Constipation: Continue scheduled MiraLAX-but change to daily, resume Linzess  Chronic pain/history of interstitial cystitis: Continue with methadone.  History of seizure disorder: Continue Keppra  History of tobacco abuse: Counseled, continue transdermal nicotine  History of depression: Appears stable, denies any suicidal/homicidal ideation. Continue Prozac  Disposition: Remain inpatient  Antimicrobial agents  See below  Anti-infectives    Start     Dose/Rate Route Frequency Ordered Stop   11/05/14 1400  piperacillin-tazobactam (ZOSYN) IVPB 3.375 g     3.375 g 12.5 mL/hr over 240 Minutes Intravenous 3 times per day 11/05/14 0800     11/05/14 0815  piperacillin-tazobactam (ZOSYN) IVPB 3.375 g     3.375 g 100 mL/hr over 30 Minutes Intravenous  Once 11/05/14 0802 11/05/14 0908      DVT Prophylaxis: Eliquis  Code Status: Full code   Family Communication None at bedside  Procedures: None  CONSULTS:  GI and general surgery  Time spent 20 minutes-Greater than 50% of this time was spent in counseling, explanation of diagnosis, planning of further management, and coordination of  care.  MEDICATIONS: Scheduled Meds: . apixaban  5 mg Oral BID  . famotidine  20 mg Oral Daily  . FLUoxetine  20 mg Oral Daily  . levETIRAcetam  500 mg Oral BID  . Linaclotide  145 mcg Oral Daily  . methadone  85 mg Oral Q breakfast  . nicotine  21 mg Transdermal Daily  . piperacillin-tazobactam (ZOSYN)  IV  3.375 g Intravenous 3 times per day  . [START ON 11/07/2014] polyethylene glycol  17 g Oral Daily  . sodium chloride  1,500 mL Intravenous Once  . sodium chloride  3 mL Intravenous Q12H   Continuous Infusions: . sodium chloride 75 mL/hr at 11/05/14 1041   PRN Meds:.acetaminophen **OR** acetaminophen, HYDROmorphone (DILAUDID) injection, magnesium hydroxide, ondansetron, sodium phosphate    PHYSICAL EXAM: Vital signs in last 24 hours: Filed Vitals:   11/05/14 1544 11/05/14 2208 11/06/14 0553 11/06/14 0600  BP: 101/48 107/62 81/40 94/60   Pulse: 53 69 50 56  Temp: 98.5 F (36.9 C) 97.5 F (36.4 C) 97.6 F (36.4 C)   TempSrc: Oral Oral Oral   Resp:  16 16   SpO2: 98% 97% 98%     Weight change:  There were no vitals filed for this visit. There is no weight on file to calculate BMI.   Gen Exam: Awake and alert with clear speech.   Neck: Supple, No JVD.  Chest: B/L Clear.   CVS: S1 S2 Regular, no murmurs.  Abdomen: soft, BS +,but diffusely tender-without rebound, non distended. Extremities: no edema, lower extremities warm to touch. Neurologic: Non Focal.   Skin: No Rash.   Wounds: N/A.    Intake/Output from previous day:  Intake/Output Summary (Last  24 hours) at 11/06/14 1048 Last data filed at 11/06/14 0900  Gross per 24 hour  Intake    240 ml  Output      0 ml  Net    240 ml     LAB RESULTS: CBC  Recent Labs Lab 11/02/14 0924 11/03/14 0451 11/06/14 0620  WBC 6.9 5.0 4.4  HGB 13.3 10.6* 10.3*  HCT 42.5 35.1* 33.0*  PLT 222 198 170  MCV 71.9* 72.1* 72.7*  MCH 22.5* 21.8* 22.7*  MCHC 31.3 30.2 31.2  RDW 28.6* 28.6* 27.4*    Chemistries    Recent Labs Lab 11/02/14 0924 11/03/14 0451 11/06/14 0620  NA 136 137 138  K 3.9 4.3 3.8  CL 105 111 108  CO2 24 21* 24  GLUCOSE 90 89 92  BUN 14 12 <5*  CREATININE 0.83 0.70 0.95  CALCIUM 8.8* 7.5* 7.8*    CBG:  Recent Labs Lab 11/03/14 1046 11/04/14 0751 11/05/14 0741  GLUCAP 78 92 104*    GFR CrCl cannot be calculated (Unknown ideal weight.).  Coagulation profile  Recent Labs Lab 11/03/14 0451  INR 1.24    Cardiac Enzymes No results for input(s): CKMB, TROPONINI, MYOGLOBIN in the last 168 hours.  Invalid input(s): CK  Invalid input(s): POCBNP No results for input(s): DDIMER in the last 72 hours. No results for input(s): HGBA1C in the last 72 hours. No results for input(s): CHOL, HDL, LDLCALC, TRIG, CHOLHDL, LDLDIRECT in the last 72 hours. No results for input(s): TSH, T4TOTAL, T3FREE, THYROIDAB in the last 72 hours.  Invalid input(s): FREET3 No results for input(s): VITAMINB12, FOLATE, FERRITIN, TIBC, IRON, RETICCTPCT in the last 72 hours. No results for input(s): LIPASE, AMYLASE in the last 72 hours.  Urine Studies No results for input(s): UHGB, CRYS in the last 72 hours.  Invalid input(s): UACOL, UAPR, USPG, UPH, UTP, UGL, UKET, UBIL, UNIT, UROB, ULEU, UEPI, UWBC, URBC, UBAC, CAST, UCOM, BILUA  MICROBIOLOGY: No results found for this or any previous visit (from the past 240 hour(s)).  RADIOLOGY STUDIES/RESULTS: Ct Abdomen Pelvis Wo Contrast  10/08/2014   CLINICAL DATA:  Generalized abdominal pain, greatest in the left lower quadrant.  EXAM: CT ABDOMEN AND PELVIS WITHOUT CONTRAST  TECHNIQUE: Multidetector CT imaging of the abdomen and pelvis was performed following the standard protocol without IV contrast.  COMPARISON:  07/17/2013  FINDINGS: There is pneumatosis of the cecum and ascending colon with a generous volume of air in the wall. There also is a little air which appears to extend toward the undersurface of the gallbladder and liver, perhaps  in the hepatoduodenal ligament. There is a small bubble of air in the porta hepatis which may reside within the portal vein. No frank free intraperitoneal air is evident. Despite the rather generous volume of pneumatosis, there is no significant inflammatory change about the cecum or ascending colon. There is no bowel obstruction. Appendix is normal.  There are unremarkable unenhanced appearances of the liver, spleen, pancreas, adrenals and kidneys. Uterus and ovaries appear normal. There is no significant abnormality in the lower chest. There is no significant musculoskeletal abnormality.  IMPRESSION: Pneumatosis of the cecum and ascending colon. There are a few bubbles of air extending up toward the under surface of the gallbladder but the air does not appear to be free in the peritoneum. There also may be a bubble of air in the portal vein. Despite the generous volume of extraluminal air, there is little or no inflammatory change. There is no obstruction.  There is no ascites. These results were called by telephone at the time of interpretation on 10/08/2014 at 2:51 am to Dr. Dina Rich, who verbally acknowledged these results.   Electronically Signed   By: Andreas Newport M.D.   On: 10/08/2014 02:57   Ct Abdomen Pelvis W Contrast  11/02/2014   CLINICAL DATA:  Abdominal pain since leaving the hospital a few weeks ago. 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  EXAM: CT ABDOMEN AND PELVIS WITH CONTRAST  TECHNIQUE: Multidetector CT imaging of the abdomen and pelvis was performed using the standard protocol following bolus administration of intravenous contrast.  CONTRAST:  149m OMNIPAQUE IOHEXOL 300 MG/ML  SOLN  COMPARISON:  10/11/2014  FINDINGS: 3 mm nodule in the left lung base without change since prior study.  Small focal fatty infiltration in the liver adjacent to the falciform ligament. Tiny sub cm low-attenuation lesion in the medial segment left lobe unchanged since prior study. This too small to  characterize but probably represents a cyst or hemangioma. No other focal liver lesions. Gallbladder, spleen, pancreas, adrenal glands, kidneys, abdominal aorta, inferior vena cava, and retroperitoneal lymph nodes are unremarkable. Stomach, small bowel, and colon are not abnormally distended. Residual thickening of the wall of the distal ileum. This could represent residua of previous vascular compromise or inflammatory change. Visualized portions of the superior mesenteric artery and vein appear patent. No significant residual portal venous thrombosis. Small amount of free fluid in the lower abdominal mesenteric in the area of thickened bowel. No pneumatosis or portal venous gas. No free air. Mild prominence of mesenteric lymph nodes, probably reactive.  Pelvis: Small amount of free fluid in the pelvis could be reactive or physiologic. Uterus and ovaries are not enlarged. Mild bladder wall thickening may be due to under distention or cystitis. Appendix is normal. No pelvic mass or lymphadenopathy. No destructive bone lesions.  IMPRESSION: Residual bowel wall thickening in the low abdomen with small amount of adjacent free fluid. This could represent residual vascular compromise or inflammatory process. The previous portal venous thrombosis has resolved.   Electronically Signed   By: WLucienne CapersM.D.   On: 11/02/2014 23:59   Ct Abdomen Pelvis W Contrast  10/11/2014   CLINICAL DATA:  Abdominal pain. History of hernia repair, pyelonephritis, seizures, CVA and substance abuse. Patient discontinued Eliquis therapy prior to admission. Initial encounter.  EXAM: CT ABDOMEN AND PELVIS WITH CONTRAST  TECHNIQUE: Multidetector CT imaging of the abdomen and pelvis was performed using the standard protocol following bolus administration of intravenous contrast.  CONTRAST:  100 ml Omnipaque 300.  COMPARISON:  Abdominal CT 10/08/2014 and 07/17/2013  FINDINGS: Lower chest: Increased volume loss, ground-glass opacity and  linear densities within the right middle and lower lobes may reflect atelectasis or infection. Small right pleural effusion.  Hepatobiliary: There is left portal vein thrombosis with scattered low-density throughout the left hepatic lobe. No suspicious hepatic lesions demonstrated. The right and main portal veins are patent. No evidence of gallstones, gallbladder wall thickening or biliary dilatation.  Pancreas: Unremarkable. No pancreatic ductal dilatation or surrounding inflammatory changes.  Spleen: Normal in size without focal abnormality.  Adrenals/Urinary Tract: Both adrenal glands appear normal.The kidneys appear normal without evidence of urinary tract calculus, suspicious lesion or hydronephrosis. No bladder abnormalities are seen.  Stomach/Bowel: The stomach and proximal small bowel appear normal. There is distal small bowel wall thickening with mild surrounding inflammatory change. There is also mild wall thickening of the cecum. No residual pneumatosis identified.The appendix appears  normal. There is small amount of free fluid in the right upper pelvis.  Vascular/Lymphatic: There are no enlarged abdominal or pelvic lymph nodes. As above, there is left portal vein thrombosis. The right and main portal veins are patent. The superior mesenteric and splenic veins are patent. No significant arterial findings.  Reproductive: Unremarkable.  Other: Postsurgical changes in the left groin without evidence of recurrent hernia.  Musculoskeletal: No acute or significant osseous findings.  IMPRESSION: 1. Occlusive left portal vein thrombosis, likely secondary to discontinued anti coagulation. The main portal, superior mesenteric and splenic veins are patent. 2. Mild wall thickening of the terminal ileum and cecum with surrounding inflammation, possibly secondary to infectious enteritis, vasculitis or venous thrombosis. No evidence of arterial occlusion, perforation or abscess. 3. Increased predominate linear opacity  at the right lung base, likely atelectasis. 4. These results were called by telephone at the time of interpretation on 10/11/2014 at 2:00 pm to Dr. Lala Lund , who verbally acknowledged these results.   Electronically Signed   By: Richardean Sale M.D.   On: 10/11/2014 14:04   Dg Abd 2 Views  10/14/2014   CLINICAL DATA:  24 year old female with severe mid abdominal pain. Reported normal bowel movements.  EXAM: ABDOMEN - 2 VIEW  COMPARISON:  10/13/2014 abdominal radiographs.  FINDINGS: Multiple surgical clips overlie the left lower pelvis. No residual air-fluid levels are seen in the abdomen. There is minimal gas in the small bowel, with no dilated small bowel loops. There is normal colonic gas, with no appreciable stool. No pneumatosis, pneumoperitoneum or pathologic soft tissue calcifications. Visualized osseous structures appear intact.  IMPRESSION: Nonobstructive bowel gas pattern. Resolved air-fluid levels. No free intraperitoneal air.   Electronically Signed   By: Ilona Sorrel M.D.   On: 10/14/2014 09:30   Dg Abd 2 Views  10/13/2014   CLINICAL DATA:  Abdominal pain. History of left portal vein thrombosis  EXAM: ABDOMEN - 2 VIEW  COMPARISON:  CT abdomen and pelvis October 11, 2014  FINDINGS: Supine and upright abdomen images were obtained. There is a paucity of small bowel gas. Colon is not dilated. There are multiple air-fluid levels on the upright image. No free air. There is postoperative change in the left pelvis. There are phleboliths in left pelvis is well. The lung bases are clear.  IMPRESSION: Multiple air-fluid levels. Paucity of small bowel gas. These findings suggest enteritis or a degree of ileus. Obstruction is possible but felt to be somewhat less likely. No free air apparent.   Electronically Signed   By: Lowella Grip III M.D.   On: 10/13/2014 13:46   Dg Abd 2 Views  10/09/2014   CLINICAL DATA:  Generalized abdominal pain. Nausea vomiting diarrhea for 2 days  EXAM: ABDOMEN - 2 VIEW   COMPARISON:  CT 10/06/2014, radiograph 10/06/2014  FINDINGS: The oral contrast from CT of 10/06/2014 has progressed into the rectum. There is mild decrease in the volume of stool seen on comparison exam. No dilated loops of large or small bowel. NO intraoperative free air.  IMPRESSION: No evidence of bowel obstruction. Progression of oral contrast the rectum from CT of 10/06/2014.   Electronically Signed   By: Suzy Bouchard M.D.   On: 10/09/2014 09:33   Dg Abd Portable 1v  10/11/2014   CLINICAL DATA:  Generalized abdominal pain, nausea, vomiting and constipation, over the past 4 days. Initial encounter.  EXAM: PORTABLE ABDOMEN - 1 VIEW  COMPARISON:  Abdominal radiograph performed 10/09/2014  FINDINGS: The colon  contains a small amount of air but is otherwise empty. No bowel dilatation is seen to suggest obstruction. No free intra-abdominal air is seen, though evaluation for free air is limited on a single supine view.  No acute osseous abnormalities are identified.  IMPRESSION: Colon contains a small amount of air but is otherwise empty. No evidence for bowel obstruction. No radiographic evidence to suggest constipation.   Electronically Signed   By: Garald Balding M.D.   On: 10/11/2014 00:36   Ct Angio Abd/pel W/ And/or W/o  11/03/2014   CLINICAL DATA:  Severe left lower quadrant pain, and nausea. Patient with portal venous and cerebral venous thrombosis.  EXAM: CT ANGIOGRAPHY ABDOMEN AND PELVIS WITH CONTRAST AND WITHOUT CONTRAST  TECHNIQUE: Multidetector CT imaging of the abdomen and pelvis was performed using the standard protocol during bolus administration of intravenous contrast. Multiplanar reconstructed images including MIPs were obtained and reviewed to evaluate the vascular anatomy.  CONTRAST:  100 cc Omnipaque 350.  COMPARISON:  CT of the abdomen pelvis dated 11/02/2014  FINDINGS: Arterial findings  Aorta  Normal in caliber, without filling defects or stenosis.  Celiac axis  Normal in caliber,  without filling defects or stenosis.  Superior mesenteric  Normal in caliber, without filling defects or stenosis.  Left renal  Normal in caliber, without filling defects or stenosis. Accessory left renal artery is noted.  Right renal  Normal in caliber, without filling defects or stenosis.  Inferior mesenteric  Normal in caliber, without filling defects or stenosis.  Left iliac  Normal in caliber, without filling defects or stenosis.  Right iliac  Normal in caliber, without filling defects or stenosis.  Venous findings  No evidence of portal venous thrombosis.  Review of the MIP images confirms the above findings.  Nonvascular findings  There is mesenteric stranding and small amount of free fluid within the mid left abdomen. There is also a long segment of bowel thickening of the distal third of the ileum. The distal most ileum is noted to be distended and fluid-filled with maximum diameter of 2.7 cm. The terminal ileum is prominent. The appendix is normal. The remainder of the upper GI tract and colon are normal. Multiple sub cm rounded mesenteric lymph nodes are seen within the same area of the abdomen. There is a tiny amount of free fluid, tracking along the left pericolic gutter. There is a small amount of free fluid within the pelvis as well.  Focal fatty infiltration along the falciform ligament of the liver is noted, otherwise the liver appears normal. The spleen, pancreas, bilateral adrenal glands, bilateral kidneys are normal. Vicarious excretion of contrast within the otherwise normal gallbladder is noted.  IMPRESSION: No evidence of a arterial or venous thrombosis.  Increased mesenteric stranding with interspersed shotty mesenteric lymph nodes within the mid left abdomen, in the area of long segment of thickening of the distal ileum. Thickening of the terminal ileum and a focal distention of up to 2.7 cm of the distal ileum leading to it. These findings may represent infectious, inflammatory or ischemic  bowel changes, with associated reactive lymphadenopathy. Alternatively mesenteric adenitis is of consideration.   Electronically Signed   By: Fidela Salisbury M.D.   On: 11/03/2014 11:42    Oren Binet, MD  [facility] Pager:[phone]  If 7PM-7AM, please contact night-coverage [email or web address] Password [email or web address] 11/06/2014, 10:48 AM   LOS: 3 days

## 2014-11-06 NOTE — Progress Notes (Signed)
Patient being discharged. Nurse will go over discharge instructions and give prescriptions as ordered.

## 2014-11-06 NOTE — Discharge Summary (Signed)
PATIENT DETAILS Name: Victoria Alvarez Age: 24 y.o. Sex: female Date of Birth: Jul 03, 1990 MRN: [id number]. Admitting Physician: Ivor Costa, MD ALP:[id number], Lennox Laity, MD  Admit Date: 11/02/2014 Discharge date: 11/06/2014  Recommendations for Outpatient Follow-up:  1. Please check CBC and BMET in one week 2. Age appropriate gen health maintenance   PRIMARY DISCHARGE DIAGNOSIS:  Principal Problem:   Abdominal pain Active Problems:   Tobacco abuse   Seizure   Methadone maintenance therapy patient   Portal vein thrombosis   Cerebral venous sinus thrombosis   Depression   Slow transit constipation      PAST MEDICAL HISTORY: Past Medical History  Diagnosis Date  . Interstitial cystitis     pain from this was reason for starting opioids age 67.   . Chronic pelvic pain in female   . Headache(784.0)   . Pyelonephritis affecting pregnancy in first trimester July 2015  . Depression 2007  . Opioid dependence     to Rx opioids.  switched to Methadone after second child born in 04/2014  . Pneumonia 03/2014  . Seizures   . Stroke 05/2014    presented with right sided weakness.   . Numbness     right side  . Dizziness 07/18/14  . Confusion   . 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 Preeclampsia 2012  . Miscarriage 2005  . Non-compliance     with anticoagulant  . 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 Cerebral venous sinus thrombosis     DISCHARGE MEDICATIONS: Current Discharge Medication List    START taking these medications   Details  amoxicillin-clavulanate (AUGMENTIN) 875-125 MG per tablet Take 1 tablet by mouth 2 (two) times daily. Qty: 14 tablet, Refills: 0    Linaclotide (LINZESS) 145 MCG CAPS capsule Take 1 capsule (145 mcg total) by mouth daily. Qty: 30 capsule, Refills: 0    metoCLOPramide (REGLAN) 5 MG tablet Take 1 tablet (5 mg total) by mouth every 6 (six) hours as needed for nausea. Qty: 20 tablet, Refills: 0      CONTINUE these medications which have CHANGED   Details  polyethylene glycol (MIRALAX / GLYCOLAX) packet Take 17 g by mouth daily. Qty: 28 each, Refills: 0      CONTINUE these medications which have NOT CHANGED   Details  apixaban (ELIQUIS) 5 MG TABS tablet Take 1 tablet (5 mg total) by mouth 2 (two) times daily. Qty: 60 tablet, Refills: 0    FLUoxetine (PROZAC) 20 MG capsule Take 1 capsule (20 mg total) by mouth daily. Qty: 30 capsule, Refills: 3   Associated Diagnoses: Depression    levETIRAcetam (KEPPRA) 500 MG tablet Take 1 tablet (500 mg total) by mouth 2 (two) times daily. Qty: 60 tablet, Refills: 3    methadone (DOLOPHINE) 5 MG tablet Take 17 tablets (85 mg total) by mouth daily. Home medication continued Qty: 30 tablet, Refills: 0    EPINEPHrine 0.3 mg/0.3 mL IJ SOAJ injection Inject 0.3 mLs (0.3 mg total) into the muscle once. Qty: 1 Device, Refills: 1   Associated Diagnoses: Bee sting allergy      STOP taking these medications     docusate sodium (COLACE) 100 MG capsule      ondansetron (ZOFRAN) 4 MG tablet         ALLERGIES:   Allergies  Allergen Reactions  . Ativan [Lorazepam] Other (See Comments)    " I don't remember anything."  . Bee Venom Swelling  . Ciprofloxacin Diarrhea and Nausea And Vomiting  BRIEF HPI:  See H&P, Labs, Consult and Test reports for all details in brief, patient was admitted for evaluation of abdominal pain.   CONSULTATIONS:   GI and general surgery  PERTINENT RADIOLOGIC STUDIES: Ct Abdomen Pelvis Wo Contrast  10/08/2014   CLINICAL DATA:  Generalized abdominal pain, greatest in the left lower quadrant.  EXAM: CT ABDOMEN AND PELVIS WITHOUT CONTRAST  TECHNIQUE: Multidetector CT imaging of the abdomen and pelvis was performed following the standard protocol without IV contrast.  COMPARISON:  07/17/2013  FINDINGS: There is pneumatosis of the cecum and ascending colon with a generous volume of air in the wall. There also is a little air which appears to extend toward the  undersurface of the gallbladder and liver, perhaps in the hepatoduodenal ligament. There is a small bubble of air in the porta hepatis which may reside within the portal vein. No frank free intraperitoneal air is evident. Despite the rather generous volume of pneumatosis, there is no significant inflammatory change about the cecum or ascending colon. There is no bowel obstruction. Appendix is normal.  There are unremarkable unenhanced appearances of the liver, spleen, pancreas, adrenals and kidneys. Uterus and ovaries appear normal. There is no significant abnormality in the lower chest. There is no significant musculoskeletal abnormality.  IMPRESSION: Pneumatosis of the cecum and ascending colon. There are a few bubbles of air extending up toward the under surface of the gallbladder but the air does not appear to be free in the peritoneum. There also may be a bubble of air in the portal vein. Despite the generous volume of extraluminal air, there is little or no inflammatory change. There is no obstruction. There is no ascites. These results were called by telephone at the time of interpretation on 10/08/2014 at 2:51 am to Dr. Dina Rich, who verbally acknowledged these results.   Electronically Signed   By: Andreas Newport M.D.   On: 10/08/2014 02:57   Ct Abdomen Pelvis W Contrast  11/02/2014   CLINICAL DATA:  Abdominal pain since leaving the hospital a few weeks ago. 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  EXAM: CT ABDOMEN AND PELVIS WITH CONTRAST  TECHNIQUE: Multidetector CT imaging of the abdomen and pelvis was performed using the standard protocol following bolus administration of intravenous contrast.  CONTRAST:  157m OMNIPAQUE IOHEXOL 300 MG/ML  SOLN  COMPARISON:  10/11/2014  FINDINGS: 3 mm nodule in the left lung base without change since prior study.  Small focal fatty infiltration in the liver adjacent to the falciform ligament. Tiny sub cm low-attenuation lesion in the medial segment left  lobe unchanged since prior study. This too small to characterize but probably represents a cyst or hemangioma. No other focal liver lesions. Gallbladder, spleen, pancreas, adrenal glands, kidneys, abdominal aorta, inferior vena cava, and retroperitoneal lymph nodes are unremarkable. Stomach, small bowel, and colon are not abnormally distended. Residual thickening of the wall of the distal ileum. This could represent residua of previous vascular compromise or inflammatory change. Visualized portions of the superior mesenteric artery and vein appear patent. No significant residual portal venous thrombosis. Small amount of free fluid in the lower abdominal mesenteric in the area of thickened bowel. No pneumatosis or portal venous gas. No free air. Mild prominence of mesenteric lymph nodes, probably reactive.  Pelvis: Small amount of free fluid in the pelvis could be reactive or physiologic. Uterus and ovaries are not enlarged. Mild bladder wall thickening may be due to under distention or cystitis. Appendix is normal. No pelvic mass  or lymphadenopathy. No destructive bone lesions.  IMPRESSION: Residual bowel wall thickening in the low abdomen with small amount of adjacent free fluid. This could represent residual vascular compromise or inflammatory process. The previous portal venous thrombosis has resolved.   Electronically Signed   By: Lucienne Capers M.D.   On: 11/02/2014 23:59   Ct Abdomen Pelvis W Contrast  10/11/2014   CLINICAL DATA:  Abdominal pain. History of hernia repair, pyelonephritis, seizures, CVA and substance abuse. Patient discontinued Eliquis therapy prior to admission. Initial encounter.  EXAM: CT ABDOMEN AND PELVIS WITH CONTRAST  TECHNIQUE: Multidetector CT imaging of the abdomen and pelvis was performed using the standard protocol following bolus administration of intravenous contrast.  CONTRAST:  100 ml Omnipaque 300.  COMPARISON:  Abdominal CT 10/08/2014 and 07/17/2013  FINDINGS: Lower chest:  Increased volume loss, ground-glass opacity and linear densities within the right middle and lower lobes may reflect atelectasis or infection. Small right pleural effusion.  Hepatobiliary: There is left portal vein thrombosis with scattered low-density throughout the left hepatic lobe. No suspicious hepatic lesions demonstrated. The right and main portal veins are patent. No evidence of gallstones, gallbladder wall thickening or biliary dilatation.  Pancreas: Unremarkable. No pancreatic ductal dilatation or surrounding inflammatory changes.  Spleen: Normal in size without focal abnormality.  Adrenals/Urinary Tract: Both adrenal glands appear normal.The kidneys appear normal without evidence of urinary tract calculus, suspicious lesion or hydronephrosis. No bladder abnormalities are seen.  Stomach/Bowel: The stomach and proximal small bowel appear normal. There is distal small bowel wall thickening with mild surrounding inflammatory change. There is also mild wall thickening of the cecum. No residual pneumatosis identified.The appendix appears normal. There is small amount of free fluid in the right upper pelvis.  Vascular/Lymphatic: There are no enlarged abdominal or pelvic lymph nodes. As above, there is left portal vein thrombosis. The right and main portal veins are patent. The superior mesenteric and splenic veins are patent. No significant arterial findings.  Reproductive: Unremarkable.  Other: Postsurgical changes in the left groin without evidence of recurrent hernia.  Musculoskeletal: No acute or significant osseous findings.  IMPRESSION: 1. Occlusive left portal vein thrombosis, likely secondary to discontinued anti coagulation. The main portal, superior mesenteric and splenic veins are patent. 2. Mild wall thickening of the terminal ileum and cecum with surrounding inflammation, possibly secondary to infectious enteritis, vasculitis or venous thrombosis. No evidence of arterial occlusion, perforation or  abscess. 3. Increased predominate linear opacity at the right lung base, likely atelectasis. 4. These results were called by telephone at the time of interpretation on 10/11/2014 at 2:00 pm to Dr. Lala Lund , who verbally acknowledged these results.   Electronically Signed   By: Richardean Sale M.D.   On: 10/11/2014 14:04   Dg Abd 2 Views  10/14/2014   CLINICAL DATA:  24 year old female with severe mid abdominal pain. Reported normal bowel movements.  EXAM: ABDOMEN - 2 VIEW  COMPARISON:  10/13/2014 abdominal radiographs.  FINDINGS: Multiple surgical clips overlie the left lower pelvis. No residual air-fluid levels are seen in the abdomen. There is minimal gas in the small bowel, with no dilated small bowel loops. There is normal colonic gas, with no appreciable stool. No pneumatosis, pneumoperitoneum or pathologic soft tissue calcifications. Visualized osseous structures appear intact.  IMPRESSION: Nonobstructive bowel gas pattern. Resolved air-fluid levels. No free intraperitoneal air.   Electronically Signed   By: Ilona Sorrel M.D.   On: 10/14/2014 09:30   Dg Abd 2 Views  10/13/2014  CLINICAL DATA:  Abdominal pain. History of left portal vein thrombosis  EXAM: ABDOMEN - 2 VIEW  COMPARISON:  CT abdomen and pelvis October 11, 2014  FINDINGS: Supine and upright abdomen images were obtained. There is a paucity of small bowel gas. Colon is not dilated. There are multiple air-fluid levels on the upright image. No free air. There is postoperative change in the left pelvis. There are phleboliths in left pelvis is well. The lung bases are clear.  IMPRESSION: Multiple air-fluid levels. Paucity of small bowel gas. These findings suggest enteritis or a degree of ileus. Obstruction is possible but felt to be somewhat less likely. No free air apparent.   Electronically Signed   By: Lowella Grip III M.D.   On: 10/13/2014 13:46   Dg Abd 2 Views  10/09/2014   CLINICAL DATA:  Generalized abdominal pain. Nausea  vomiting diarrhea for 2 days  EXAM: ABDOMEN - 2 VIEW  COMPARISON:  CT 10/06/2014, radiograph 10/06/2014  FINDINGS: The oral contrast from CT of 10/06/2014 has progressed into the rectum. There is mild decrease in the volume of stool seen on comparison exam. No dilated loops of large or small bowel. NO intraoperative free air.  IMPRESSION: No evidence of bowel obstruction. Progression of oral contrast the rectum from CT of 10/06/2014.   Electronically Signed   By: Suzy Bouchard M.D.   On: 10/09/2014 09:33   Dg Abd Portable 1v  10/11/2014   CLINICAL DATA:  Generalized abdominal pain, nausea, vomiting and constipation, over the past 4 days. Initial encounter.  EXAM: PORTABLE ABDOMEN - 1 VIEW  COMPARISON:  Abdominal radiograph performed 10/09/2014  FINDINGS: The colon contains a small amount of air but is otherwise empty. No bowel dilatation is seen to suggest obstruction. No free intra-abdominal air is seen, though evaluation for free air is limited on a single supine view.  No acute osseous abnormalities are identified.  IMPRESSION: Colon contains a small amount of air but is otherwise empty. No evidence for bowel obstruction. No radiographic evidence to suggest constipation.   Electronically Signed   By: Garald Balding M.D.   On: 10/11/2014 00:36   Ct Angio Abd/pel W/ And/or W/o  11/03/2014   CLINICAL DATA:  Severe left lower quadrant pain, and nausea. Patient with portal venous and cerebral venous thrombosis.  EXAM: CT ANGIOGRAPHY ABDOMEN AND PELVIS WITH CONTRAST AND WITHOUT CONTRAST  TECHNIQUE: Multidetector CT imaging of the abdomen and pelvis was performed using the standard protocol during bolus administration of intravenous contrast. Multiplanar reconstructed images including MIPs were obtained and reviewed to evaluate the vascular anatomy.  CONTRAST:  100 cc Omnipaque 350.  COMPARISON:  CT of the abdomen pelvis dated 11/02/2014  FINDINGS: Arterial findings  Aorta  Normal in caliber, without filling  defects or stenosis.  Celiac axis  Normal in caliber, without filling defects or stenosis.  Superior mesenteric  Normal in caliber, without filling defects or stenosis.  Left renal  Normal in caliber, without filling defects or stenosis. Accessory left renal artery is noted.  Right renal  Normal in caliber, without filling defects or stenosis.  Inferior mesenteric  Normal in caliber, without filling defects or stenosis.  Left iliac  Normal in caliber, without filling defects or stenosis.  Right iliac  Normal in caliber, without filling defects or stenosis.  Venous findings  No evidence of portal venous thrombosis.  Review of the MIP images confirms the above findings.  Nonvascular findings  There is mesenteric stranding and small amount of  free fluid within the mid left abdomen. There is also a long segment of bowel thickening of the distal third of the ileum. The distal most ileum is noted to be distended and fluid-filled with maximum diameter of 2.7 cm. The terminal ileum is prominent. The appendix is normal. The remainder of the upper GI tract and colon are normal. Multiple sub cm rounded mesenteric lymph nodes are seen within the same area of the abdomen. There is a tiny amount of free fluid, tracking along the left pericolic gutter. There is a small amount of free fluid within the pelvis as well.  Focal fatty infiltration along the falciform ligament of the liver is noted, otherwise the liver appears normal. The spleen, pancreas, bilateral adrenal glands, bilateral kidneys are normal. Vicarious excretion of contrast within the otherwise normal gallbladder is noted.  IMPRESSION: No evidence of a arterial or venous thrombosis.  Increased mesenteric stranding with interspersed shotty mesenteric lymph nodes within the mid left abdomen, in the area of long segment of thickening of the distal ileum. Thickening of the terminal ileum and a focal distention of up to 2.7 cm of the distal ileum leading to it. These  findings may represent infectious, inflammatory or ischemic bowel changes, with associated reactive lymphadenopathy. Alternatively mesenteric adenitis is of consideration.   Electronically Signed   By: Fidela Salisbury M.D.   On: 11/03/2014 11:42     PERTINENT LAB RESULTS: CBC:  Recent Labs  11/06/14 0620  WBC 4.4  HGB 10.3*  HCT 33.0*  PLT 170   CMET CMP     Component Value Date/Time   NA 138 11/06/2014 0620   K 3.8 11/06/2014 0620   CL 108 11/06/2014 0620   CO2 24 11/06/2014 0620   GLUCOSE 92 11/06/2014 0620   BUN <5* 11/06/2014 0620   CREATININE 0.95 11/06/2014 0620   CREATININE 0.81 07/12/2014 1717   CALCIUM 7.8* 11/06/2014 0620   PROT 3.8* 11/03/2014 0451   ALBUMIN 2.3* 11/03/2014 0451   AST 21 11/03/2014 0451   ALT 18 11/03/2014 0451   ALKPHOS 37* 11/03/2014 0451   BILITOT 0.5 11/03/2014 0451   GFRNONAA >60 11/06/2014 0620   GFRNONAA >89 07/12/2014 1717   GFRAA >60 11/06/2014 0620   GFRAA >89 07/12/2014 1717    GFR Estimated Creatinine Clearance: 96.9 mL/min (by C-G formula based on Cr of 0.95). No results for input(s): LIPASE, AMYLASE in the last 72 hours. No results for input(s): CKTOTAL, CKMB, CKMBINDEX, TROPONINI in the last 72 hours. Invalid input(s): POCBNP No results for input(s): DDIMER in the last 72 hours. No results for input(s): HGBA1C in the last 72 hours. No results for input(s): CHOL, HDL, LDLCALC, TRIG, CHOLHDL, LDLDIRECT in the last 72 hours. No results for input(s): TSH, T4TOTAL, T3FREE, THYROIDAB in the last 72 hours.  Invalid input(s): FREET3 No results for input(s): VITAMINB12, FOLATE, FERRITIN, TIBC, IRON, RETICCTPCT in the last 72 hours. Coags: No results for input(s): INR in the last 72 hours.  Invalid input(s): PT Microbiology: No results found for this or any previous visit (from the past 240 hour(s)).   BRIEF HOSPITAL COURSE:  Abdominal pain:?ileitis. Recent history of acute portal vein thrombosis and bowel  ischemia-however repeat CT scan on 8/25 negative for any new thromboses. Multiple bowel movements last night-advanceD diet to soft, start linzess. No further recommendations from gen surgery, GI has signed off. This afternoon, patient claims she is much improved, and has tolerated a regular diet, she is requesting discharge. We will transition her  to Oral Augmentin on discharge.  Active Problems: Recent hx of Left portal venous sinus thrombosis: Continue Eliquis  History of cerebral venous sinus thrombosis:Continue Eliquis  Chronic Constipation: Continue scheduled MiraLAX-but change to daily, resume Linzess  Chronic pain/history of interstitial cystitis: Continue with methadone.  History of seizure disorder: Continue Keppra  History of tobacco abuse: Counseled, continue transdermal nicotine  History of depression: Appears stable, denies any suicidal/homicidal ideation. Continue Prozac   TODAY-DAY OF DISCHARGE:  Subjective:   Victoria Alvarez today has no headache,no chest abdominal pain,no new weakness tingling or numbness, feels much better wants to go home today.   Objective:   Blood pressure 94/60, pulse 56, temperature 97.6 F (36.4 C), temperature source Oral, resp. rate 16, height 5' 3"  (1.6 m), weight 91 kg (200 lb 9.9 oz), last menstrual period 10/03/2014, SpO2 98 %, not currently breastfeeding.  Intake/Output Summary (Last 24 hours) at 11/06/14 1332 Last data filed at 11/06/14 1214  Gross per 24 hour  Intake    360 ml  Output      0 ml  Net    360 ml   Filed Weights   11/06/14 1200  Weight: 91 kg (200 lb 9.9 oz)    Exam Awake Alert, Oriented *3, No new F.N deficits, Normal affect [place].AT,PERRAL Supple Neck,No JVD, No cervical lymphadenopathy appriciated.  Symmetrical Chest wall movement, Good air movement bilaterally, CTAB RRR,No Gallops,Rubs or new Murmurs, No Parasternal Heave +ve B.Sounds, Abd Soft, Non tender, No organomegaly appriciated, No rebound -guarding  or rigidity. No Cyanosis, Clubbing or edema, No new Rash or bruise  DISCHARGE CONDITION: Stable  DISPOSITION: Home  DISCHARGE INSTRUCTIONS:    Activity:  As tolerated   Diet recommendation: Regular Diet-but soft diet for one week before advancing further  Discharge Instructions    Call MD for:  severe uncontrolled pain    Complete by:  As directed      Diet general    Complete by:  As directed   Stay on a soft diet for 1 week.     Increase activity slowly    Complete by:  As directed            Follow-up Information    Follow up with [facility]     On 11/10/2014.   Why:  Appointment on 11/10/14 at 4:15 pm with Dr. Adrian Blackwater.   Contact information:   [place] [address] [phone]      Total Time spent on discharge equals 25  45 minutes.  SignedOren Binet 11/06/2014 1:32 PM

## 2014-11-06 NOTE — Progress Notes (Signed)
Informed by RN that patient requesting discharge. She appears much improved, denies any abdominal pain (on chronic methadone), claims she has tolerated a soft diet and now is wanting to go home. She is being discharged home in a stable manner and at her request.

## 2014-11-08 ENCOUNTER — Ambulatory Visit: Payer: Medicaid Other | Admitting: Neurology

## 2014-11-10 ENCOUNTER — Inpatient Hospital Stay: Payer: Medicaid Other | Admitting: Family Medicine

## 2014-11-14 ENCOUNTER — Inpatient Hospital Stay (HOSPITAL_COMMUNITY)
Admission: EM | Admit: 2014-11-14 | Discharge: 2014-12-05 | DRG: 388 | Disposition: A | Discharge status: Home / Self Care | Payer: Medicaid Other | Attending: Internal Medicine | Admitting: Internal Medicine

## 2014-11-14 ENCOUNTER — Emergency Department (HOSPITAL_COMMUNITY): Payer: Medicaid Other

## 2014-11-14 ENCOUNTER — Encounter (HOSPITAL_COMMUNITY): Payer: Self-pay | Admitting: Emergency Medicine

## 2014-11-14 DIAGNOSIS — R569 Unspecified convulsions: Secondary | ICD-10-CM | POA: Diagnosis not present

## 2014-11-14 DIAGNOSIS — F1120 Opioid dependence, uncomplicated: Secondary | ICD-10-CM | POA: Diagnosis not present

## 2014-11-14 DIAGNOSIS — R1031 Right lower quadrant pain: Secondary | ICD-10-CM

## 2014-11-14 DIAGNOSIS — Z720 Tobacco use: Secondary | ICD-10-CM | POA: Diagnosis present

## 2014-11-14 DIAGNOSIS — K50011 Crohn's disease of small intestine with rectal bleeding: Secondary | ICD-10-CM | POA: Diagnosis not present

## 2014-11-14 DIAGNOSIS — K5900 Constipation, unspecified: Secondary | ICD-10-CM | POA: Diagnosis present

## 2014-11-14 DIAGNOSIS — E872 Acidosis, unspecified: Secondary | ICD-10-CM | POA: Diagnosis present

## 2014-11-14 DIAGNOSIS — F329 Major depressive disorder, single episode, unspecified: Secondary | ICD-10-CM | POA: Diagnosis present

## 2014-11-14 DIAGNOSIS — N3011 Interstitial cystitis (chronic) with hematuria: Secondary | ICD-10-CM | POA: Diagnosis present

## 2014-11-14 DIAGNOSIS — Z9119 Patient's noncompliance with other medical treatment and regimen: Secondary | ICD-10-CM | POA: Diagnosis present

## 2014-11-14 DIAGNOSIS — R109 Unspecified abdominal pain: Secondary | ICD-10-CM | POA: Diagnosis not present

## 2014-11-14 DIAGNOSIS — T40605A Adverse effect of unspecified narcotics, initial encounter: Secondary | ICD-10-CM | POA: Diagnosis present

## 2014-11-14 DIAGNOSIS — R933 Abnormal findings on diagnostic imaging of other parts of digestive tract: Secondary | ICD-10-CM

## 2014-11-14 DIAGNOSIS — G08 Intracranial and intraspinal phlebitis and thrombophlebitis: Secondary | ICD-10-CM

## 2014-11-14 DIAGNOSIS — G40909 Epilepsy, unspecified, not intractable, without status epilepticus: Secondary | ICD-10-CM | POA: Diagnosis present

## 2014-11-14 DIAGNOSIS — Z79891 Long term (current) use of opiate analgesic: Secondary | ICD-10-CM | POA: Diagnosis not present

## 2014-11-14 DIAGNOSIS — I81 Portal vein thrombosis: Secondary | ICD-10-CM | POA: Diagnosis present

## 2014-11-14 DIAGNOSIS — K529 Noninfective gastroenteritis and colitis, unspecified: Secondary | ICD-10-CM | POA: Diagnosis present

## 2014-11-14 DIAGNOSIS — Z7901 Long term (current) use of anticoagulants: Secondary | ICD-10-CM | POA: Diagnosis not present

## 2014-11-14 DIAGNOSIS — Z8673 Personal history of transient ischemic attack (TIA), and cerebral infarction without residual deficits: Secondary | ICD-10-CM

## 2014-11-14 DIAGNOSIS — K5669 Other intestinal obstruction: Secondary | ICD-10-CM | POA: Diagnosis present

## 2014-11-14 DIAGNOSIS — Z6835 Body mass index (BMI) 35.0-35.9, adult: Secondary | ICD-10-CM

## 2014-11-14 DIAGNOSIS — K633 Ulcer of intestine: Secondary | ICD-10-CM | POA: Diagnosis present

## 2014-11-14 DIAGNOSIS — R1084 Generalized abdominal pain: Secondary | ICD-10-CM

## 2014-11-14 DIAGNOSIS — Z23 Encounter for immunization: Secondary | ICD-10-CM | POA: Diagnosis not present

## 2014-11-14 DIAGNOSIS — R1032 Left lower quadrant pain: Secondary | ICD-10-CM | POA: Diagnosis not present

## 2014-11-14 DIAGNOSIS — F17210 Nicotine dependence, cigarettes, uncomplicated: Secondary | ICD-10-CM | POA: Diagnosis present

## 2014-11-14 DIAGNOSIS — N3010 Interstitial cystitis (chronic) without hematuria: Secondary | ICD-10-CM | POA: Diagnosis present

## 2014-11-14 DIAGNOSIS — G894 Chronic pain syndrome: Secondary | ICD-10-CM | POA: Diagnosis present

## 2014-11-14 DIAGNOSIS — R140 Abdominal distension (gaseous): Secondary | ICD-10-CM

## 2014-11-14 DIAGNOSIS — R1013 Epigastric pain: Secondary | ICD-10-CM | POA: Diagnosis not present

## 2014-11-14 LAB — URINALYSIS, ROUTINE W REFLEX MICROSCOPIC
Bilirubin Urine: NEGATIVE
Glucose, UA: NEGATIVE mg/dL
Hgb urine dipstick: NEGATIVE
Ketones, ur: NEGATIVE mg/dL
Leukocytes, UA: NEGATIVE
Nitrite: NEGATIVE
Protein, ur: NEGATIVE mg/dL
Specific Gravity, Urine: >1.046 — ABNORMAL HIGH (ref 1.005–1.030)
Urobilinogen, UA: 0.2 mg/dL (ref 0.0–1.0)
pH: 5.5 (ref 5.0–8.0)

## 2014-11-14 LAB — COMPREHENSIVE METABOLIC PANEL
ALT: 19 U/L (ref 14–54)
AST: 25 U/L (ref 15–41)
Albumin: 3.5 g/dL (ref 3.5–5.0)
Alkaline Phosphatase: 51 U/L (ref 38–126)
Anion gap: 10 (ref 5–15)
BUN: 13 mg/dL (ref 6–20)
CO2: 23 mmol/L (ref 22–32)
Calcium: 9 mg/dL (ref 8.9–10.3)
Chloride: 101 mmol/L (ref 101–111)
Creatinine, Ser: 0.83 mg/dL (ref 0.44–1.00)
GFR calc Af Amer: >60 mL/min (ref >60)
GFR calc non Af Amer: >60 mL/min (ref >60)
Glucose, Bld: 119 mg/dL — ABNORMAL HIGH (ref 65–99)
Potassium: 3.9 mmol/L (ref 3.5–5.1)
Sodium: 134 mmol/L — ABNORMAL LOW (ref 135–145)
Total Bilirubin: 0.8 mg/dL (ref 0.3–1.2)
Total Protein: 5.7 g/dL — ABNORMAL LOW (ref 6.5–8.1)

## 2014-11-14 LAB — CBC
HCT: 42.4 % (ref 36.0–46.0)
Hemoglobin: 13.4 g/dL (ref 12.0–15.0)
MCH: 23 pg — ABNORMAL LOW (ref 26.0–34.0)
MCHC: 31.6 g/dL (ref 30.0–36.0)
MCV: 72.7 fL — ABNORMAL LOW (ref 78.0–100.0)
Platelets: 210 10*3/uL (ref 150–400)
RBC: 5.83 MIL/uL — ABNORMAL HIGH (ref 3.87–5.11)
RDW: 27.1 % — ABNORMAL HIGH (ref 11.5–15.5)
WBC: 11.6 10*3/uL — ABNORMAL HIGH (ref 4.0–10.5)

## 2014-11-14 LAB — I-STAT CG4 LACTIC ACID, ED
Lactic Acid, Venous: 2.88 mmol/L (ref 0.5–2.0)
Lactic Acid, Venous: 1.48 mmol/L (ref 0.5–2.0)

## 2014-11-14 LAB — I-STAT BETA HCG BLOOD, ED (MC, WL, AP ONLY): I-stat hCG, quantitative: <5 m[IU]/mL (ref <5)

## 2014-11-14 LAB — LIPASE, BLOOD: Lipase: 30 U/L (ref 22–51)

## 2014-11-14 MED ORDER — METRONIDAZOLE IN NACL 5-0.79 MG/ML-% IV SOLN
500.0000 mg | Freq: Three times a day (TID) | INTRAVENOUS | Status: DC
  Administered 2014-11-14 – 2014-11-18 (×12): 500 mg via INTRAVENOUS
  Filled 2014-11-14 (×13): qty 100

## 2014-11-14 MED ORDER — SODIUM CHLORIDE 0.9 % IV BOLUS (SEPSIS)
1000.0000 mL | Freq: Once | INTRAVENOUS | Status: AC
  Administered 2014-11-14: 1000 mL via INTRAVENOUS

## 2014-11-14 MED ORDER — POTASSIUM CHLORIDE IN NACL 20-0.9 MEQ/L-% IV SOLN
INTRAVENOUS | Status: DC
  Administered 2014-11-14 – 2014-11-18 (×6): via INTRAVENOUS
  Filled 2014-11-14 (×7): qty 1000

## 2014-11-14 MED ORDER — ACETAMINOPHEN 650 MG RE SUPP: 650.0000 mg | Freq: Four times a day (QID) | RECTAL | Status: DC | PRN

## 2014-11-14 MED ORDER — HYDROMORPHONE HCL 1 MG/ML IJ SOLN
1.0000 mg | Freq: Once | INTRAMUSCULAR | Status: AC
  Administered 2014-11-14: 1 mg via INTRAVENOUS
  Filled 2014-11-14: qty 1

## 2014-11-14 MED ORDER — ONDANSETRON HCL 4 MG/2ML IJ SOLN
4.0000 mg | Freq: Once | INTRAMUSCULAR | Status: AC
  Administered 2014-11-14: 4 mg via INTRAVENOUS
  Filled 2014-11-14: qty 2

## 2014-11-14 MED ORDER — HYDROMORPHONE HCL 1 MG/ML IJ SOLN
1.0000 mg | INTRAMUSCULAR | Status: DC | PRN
  Administered 2014-11-14 – 2014-11-21 (×36): 1 mg via INTRAVENOUS
  Filled 2014-11-14 (×40): qty 1

## 2014-11-14 MED ORDER — PEG-KCL-NACL-NASULF-NA ASC-C 100 G PO SOLR: 1.0000 | Freq: Once | ORAL | Status: DC

## 2014-11-14 MED ORDER — NICOTINE 21 MG/24HR TD PT24
21.0000 mg | MEDICATED_PATCH | Freq: Every day | TRANSDERMAL | Status: DC
  Administered 2014-11-14 – 2014-12-05 (×22): 21 mg via TRANSDERMAL
  Filled 2014-11-14 (×22): qty 1

## 2014-11-14 MED ORDER — ONDANSETRON HCL 4 MG PO TABS
4.0000 mg | ORAL_TABLET | Freq: Four times a day (QID) | ORAL | Status: DC | PRN
  Administered 2014-11-19 – 2014-12-04 (×13): 4 mg via ORAL
  Filled 2014-11-14 (×13): qty 1

## 2014-11-14 MED ORDER — INFLUENZA VAC SPLIT QUAD 0.5 ML IM SUSY
0.5000 mL | PREFILLED_SYRINGE | INTRAMUSCULAR | Status: DC
Start: 2014-11-15 — End: 2014-11-22
  Filled 2014-11-14 (×2): qty 0.5

## 2014-11-14 MED ORDER — LINACLOTIDE 145 MCG PO CAPS
145.0000 ug | ORAL_CAPSULE | Freq: Every day | ORAL | Status: DC
  Administered 2014-11-15 – 2014-11-22 (×8): 145 ug via ORAL
  Filled 2014-11-14 (×9): qty 1

## 2014-11-14 MED ORDER — EPINEPHRINE 0.3 MG/0.3ML IJ SOAJ: 0.3000 mg | Freq: Once | INTRAMUSCULAR | Status: DC

## 2014-11-14 MED ORDER — FLUOXETINE HCL 20 MG PO CAPS
20.0000 mg | ORAL_CAPSULE | Freq: Every day | ORAL | Status: DC
  Administered 2014-11-14 – 2014-12-05 (×22): 20 mg via ORAL
  Filled 2014-11-14 (×23): qty 1

## 2014-11-14 MED ORDER — ACETAMINOPHEN 325 MG PO TABS: 650.0000 mg | ORAL_TABLET | Freq: Four times a day (QID) | ORAL | Status: DC | PRN

## 2014-11-14 MED ORDER — IOHEXOL 300 MG/ML  SOLN
100.0000 mL | Freq: Once | INTRAMUSCULAR | Status: AC | PRN
  Administered 2014-11-14: 100 mL via INTRAVENOUS

## 2014-11-14 MED ORDER — PEG-KCL-NACL-NASULF-NA ASC-C 100 G PO SOLR
0.5000 | Freq: Once | ORAL | Status: AC
  Administered 2014-11-14: 100 g via ORAL
  Filled 2014-11-14: qty 1

## 2014-11-14 MED ORDER — LEVETIRACETAM 500 MG PO TABS
500.0000 mg | ORAL_TABLET | Freq: Two times a day (BID) | ORAL | Status: DC
  Administered 2014-11-14 – 2014-12-05 (×43): 500 mg via ORAL
  Filled 2014-11-14 (×44): qty 1

## 2014-11-14 MED ORDER — PEG-KCL-NACL-NASULF-NA ASC-C 100 G PO SOLR
0.5000 | Freq: Once | ORAL | Status: AC
  Administered 2014-11-15: 100 g via ORAL
  Filled 2014-11-14: qty 1

## 2014-11-14 MED ORDER — METHADONE HCL 5 MG PO TABS
84.0000 mg | ORAL_TABLET | Freq: Every day | ORAL | Status: DC
  Administered 2014-11-15 – 2014-12-05 (×21): 85 mg via ORAL
  Filled 2014-11-14 (×2): qty 8
  Filled 2014-11-14 (×2): qty 1
  Filled 2014-11-14: qty 8
  Filled 2014-11-14 (×2): qty 1
  Filled 2014-11-14: qty 8
  Filled 2014-11-14 (×3): qty 1
  Filled 2014-11-14: qty 8
  Filled 2014-11-14 (×4): qty 1
  Filled 2014-11-14: qty 8
  Filled 2014-11-14: qty 1
  Filled 2014-11-14: qty 8
  Filled 2014-11-14 (×2): qty 1
  Filled 2014-11-14 (×3): qty 8
  Filled 2014-11-14: qty 1
  Filled 2014-11-14 (×3): qty 8
  Filled 2014-11-14: qty 1
  Filled 2014-11-14 (×6): qty 8
  Filled 2014-11-14 (×3): qty 1
  Filled 2014-11-14: qty 8
  Filled 2014-11-14 (×2): qty 1
  Filled 2014-11-14 (×2): qty 8
  Filled 2014-11-14: qty 1

## 2014-11-14 MED ORDER — APIXABAN 5 MG PO TABS
5.0000 mg | ORAL_TABLET | Freq: Two times a day (BID) | ORAL | Status: DC
  Administered 2014-11-14 – 2014-11-26 (×25): 5 mg via ORAL
  Filled 2014-11-14 (×25): qty 1

## 2014-11-14 MED ORDER — ONDANSETRON HCL 4 MG/2ML IJ SOLN
4.0000 mg | Freq: Four times a day (QID) | INTRAMUSCULAR | Status: DC | PRN
  Administered 2014-11-14 – 2014-12-05 (×32): 4 mg via INTRAVENOUS
  Filled 2014-11-14 (×35): qty 2

## 2014-11-14 NOTE — ED Notes (Signed)
Pt abdominal pain onset last night with N/V. Pt seen here for same and was told she has a blot clot in her abdomen that had damaged her intestines.

## 2014-11-14 NOTE — ED Provider Notes (Signed)
CSN: [id number]     Arrival date & time 11/14/14  0706 History   First MD Initiated Contact with Patient 11/14/14 [phone]     Chief Complaint  Patient presents with  . Abdominal Pain  . Emesis     (Consider location/radiation/quality/duration/timing/severity/associated sxs/prior Treatment) HPI Victoria Alvarez is a 24 y.o. female with a history of cerebral venous thrombosis, portal venous thrombosis, on a liquid therapy, recent hospital admission on 8/24 for abdominal pain, comes in for evaluation of acute abdominal pain and emesis. Patient reports yesterday evening she began to have centralized abdominal discomfort similar to when she was diagnosed with her portal venous thrombosis. She reports going to bed at approximately 9:00 PM, was awoken by the pain at 11 PM and it has gradually gotten worse throughout the night. She reports no sleep due to the pain. She reports "this is the worst it's ever been". She reports associated nausea and vomiting, nonbloody and nonbilious. She has not taken any of her medications today, but has been compliant on pelvic was up until today. She denies fevers, chills, bloody or black stools, urinary symptoms, pelvic pain, chest pain shortness of breath.  Past Medical History  Diagnosis Date  . Interstitial cystitis     pain from this was reason for starting opioids age 71.   . Chronic pelvic pain in female   . Headache(784.0)   . Pyelonephritis affecting pregnancy in first trimester July 2015  . Depression 2007  . Opioid dependence     to Rx opioids.  switched to Methadone after second child born in 04/2014  . Pneumonia 03/2014  . Seizures   . Stroke 05/2014    presented with right sided weakness.   . Numbness     right side  . Dizziness 07/18/14  . Confusion   . 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 Preeclampsia 2012  . Miscarriage 2005  . Non-compliance     with anticoagulant  . 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 Cerebral venous sinus thrombosis    Past  Surgical History  Procedure Laterality Date  . Vagina surgery  ~ 2011    benign vaginal tumor removed at [facility].   . Cesarean section N/A 05/06/2014    Procedure: CESAREAN SECTION;  Surgeon: Levie Heritage, DO;  Location: [facility];  Service: Obstetrics;  Laterality: N/A;  . Inguinal hernia repair Left 2008   Family History  Problem Relation Age of Onset  . Diabetes Mother   . Heart disease Mother   . Hypertension Father   . Diabetes Maternal Grandmother   . Hypertension Maternal Grandmother   . Stroke Maternal Grandmother   . Diabetes Maternal Grandfather   . Hypertension Maternal Grandfather   . Diabetes Paternal Grandmother   . Diabetes Paternal Grandfather    Social History  Substance Use Topics  . Smoking status: Current Every Day Smoker -- 0.50 packs/day for 8 years    Types: Cigarettes  . Smokeless tobacco: Never Used     Comment: cutting back  . Alcohol Use: No   OB History    Gravida Para Term Preterm AB TAB SAB Ectopic Multiple Living   0 1 0 1 0 0 2     Review of Systems A 10 point review of systems was completed and was negative except for pertinent positives and negatives as mentioned in the history of present illness     Allergies  Ativan; Bee venom; and Ciprofloxacin  Home Medications   Prior to  Admission medications   Medication Sig Start Date End Date Taking? Authorizing Provider  amoxicillin-clavulanate (AUGMENTIN) 875-125 MG per tablet Take 1 tablet by mouth 2 (two) times daily. 11/06/14   Shanker Levora Dredge, MD  apixaban (ELIQUIS) 5 MG TABS tablet Take 1 tablet (5 mg total) by mouth 2 (two) times daily. 10/16/14 02/03/15  Leroy Sea, MD  EPINEPHrine 0.3 mg/0.3 mL IJ SOAJ injection Inject 0.3 mLs (0.3 mg total) into the muscle once. 06/16/14   Jaclyn Shaggy, MD  FLUoxetine (PROZAC) 20 MG capsule Take 1 capsule (20 mg total) by mouth daily. 07/18/14   Micki Riley, MD  levETIRAcetam (KEPPRA) 500 MG tablet Take 1 tablet (500 mg total) by mouth 2  (two) times daily. 06/13/14   Richarda Overlie, MD  Linaclotide (LINZESS) 145 MCG CAPS capsule Take 1 capsule (145 mcg total) by mouth daily. 11/06/14   Shanker Levora Dredge, MD  methadone (DOLOPHINE) 5 MG tablet Take 17 tablets (85 mg total) by mouth daily. Home medication continued 10/16/14   Leroy Sea, MD  metoCLOPramide (REGLAN) 5 MG tablet Take 1 tablet (5 mg total) by mouth every 6 (six) hours as needed for nausea. 11/06/14   Shanker Levora Dredge, MD  polyethylene glycol (MIRALAX / GLYCOLAX) packet Take 17 g by mouth daily. 11/06/14   Shanker Levora Dredge, MD   BP 139/103 mmHg  Pulse 106  Temp(Src) 99.3 F (37.4 C) (Oral)  Resp 24  Ht 5\' 3"  (1.6 m)  Wt 200 lb 9.9 oz (91 kg)  BMI 35.55 kg/m2  SpO2 99%  LMP  Physical Exam  Constitutional: She is oriented to person, place, and time. She appears well-developed and well-nourished.  Patient is tearful on exam and appears uncomfortable.  HENT:  Head: Normocephalic and atraumatic.  Mouth/Throat: Oropharynx is clear and moist.  Eyes: Conjunctivae are normal. Pupils are equal, round, and reactive to light. Right eye exhibits no discharge. Left eye exhibits no discharge. No scleral icterus.  Neck: Neck supple.  Cardiovascular: Normal rate, regular rhythm and normal heart sounds.   Pulmonary/Chest: Effort normal and breath sounds normal. No respiratory distress. She has no wheezes. She has no rales.  Abdominal: Soft.  Diffuse abdominal tenderness with palpation, worse over central abdomen. Abdomen is otherwise soft and nondistended.  Musculoskeletal: She exhibits no tenderness.  Neurological: She is alert and oriented to person, place, and time.  Cranial Nerves II-XII grossly intact  Skin: Skin is warm and dry. No rash noted.  Psychiatric: She has a normal mood and affect.  Nursing note and vitals reviewed.   ED Course  Procedures (including critical care time) Labs Review Labs Reviewed  LIPASE, BLOOD  COMPREHENSIVE METABOLIC PANEL  CBC   URINALYSIS, ROUTINE W REFLEX MICROSCOPIC (NOT AT [facility])  I-STAT BETA HCG BLOOD, ED ([facility], [facility], [facility] ONLY)  I-STAT CG4 LACTIC ACID, ED    Imaging Review Ct Abdomen Pelvis W Contrast  11/14/2014   CLINICAL DATA:  24 year old female with acute abdominal and pelvic pain with vomiting for 1 day.  EXAM: CT ABDOMEN AND PELVIS WITH CONTRAST  TECHNIQUE: Multidetector CT imaging of the abdomen and pelvis was performed using the standard protocol following bolus administration of intravenous contrast.  CONTRAST:  OMNIPAQUE IOHEXOL 300 MG/ML  SOLN  COMPARISON:  11/03/2014 and prior CTs  FINDINGS: Lower chest:  Minimal basilar atelectasis noted.  Hepatobiliary: No significant hepatic or gallbladder abnormalities. There is no evidence of biliary dilatation.  Pancreas: Unremarkable  Spleen: Unremarkable  Adrenals/Urinary Tract: Kidneys,  adrenal glands and bladder are unremarkable.  Stomach/Bowel: There is circumferential wall thickening of a moderate length segment distal ileum with adjacent inflammation and small amount of interloop fluid. There is mild distention of small bowel loops just proximal to the moderate segment wall thickening. There is no evidence of abscess or pneumoperitoneum. The appendix is normal.  Vascular/Lymphatic: Prominent mesenteric lymph nodes are likely reactive. There is no evidence of vascular abnormality.  Reproductive: Uterus and adnexal regions are unremarkable.  Other: A trace amount of free pelvic fluid is noted.  Musculoskeletal: No acute or suspicious abnormalities identified.  IMPRESSION: Enteritis involving the distal ileum with adjacent inflammation and small amount of fluid. This likely is inflammatory, infectious or Crohn's disease. No evidence of pneumoperitoneum or abscess.   Electronically Signed   By: Harmon Pier M.D.   On: 11/14/2014 10:28   I have personally reviewed and evaluated these images and lab results as part of my medical decision-making.   EKG  Interpretation None     Meds given in ED:  Medications  sodium chloride 0.9 % bolus 1,000 mL (0 mLs Intravenous Stopped 11/14/14 1008)  ondansetron (ZOFRAN) injection 4 mg (4 mg Intravenous Given 11/14/14 0805)  HYDROmorphone (DILAUDID) injection 1 mg (1 mg Intravenous Given 11/14/14 0805)  iohexol (OMNIPAQUE) 300 MG/ML solution 100 mL (100 mLs Intravenous Contrast Given 11/14/14 0955)  HYDROmorphone (DILAUDID) injection 1 mg (1 mg Intravenous Given 11/14/14 1106)  sodium chloride 0.9 % bolus 1,000 mL (0 mLs Intravenous Stopped 11/14/14 1228)  ondansetron (ZOFRAN) injection 4 mg (4 mg Intravenous Given 11/14/14 1233)    New Prescriptions   No medications on file   Filed Vitals:   11/14/14 0915 11/14/14 0930 11/14/14 0945 11/14/14 1107  BP: 105/54 110/67 112/60 94/35  Pulse: 74 87 79 68  Temp:    98.4 F (36.9 C)  TempSrc:      Resp:    18  Height:      Weight:      SpO2: 97% 97% 96% 97%    MDM  Victoria Alvarez is a 24 y.o. female with a history of portal venous thrombosis and cerebral venous thrombosis on apixaban therapy comes in for evaluation of abdominal pain with nausea and vomiting. Patient reports she has been compliant with her apixaban therapy. Patient is diffusely tender, worse over central abdomen. Abdomen otherwise soft and nondistended. Vital signs have remained stable. Slightly elevated leukocytosis at 11.6 up from 4.4, 8 days ago. Also has elevated lactic acid at 2.88 that has improved to 1.48 after 2 L of normal saline. CT abdomen shows enteritis involving the distal ileum possibly inflammatory, infectious or Crohn's disease. Plan to consult gastroenterology. Discussed patient presentation and ED course my attending, Dr. Madilyn Hook who agrees with plan for gastro-consult. Discussed with gastroenterology, Dr. Rhea Belton, who recommends inpatient hospitalization with possible colonoscopy. Consult to hospitalist, Algis Downs PA-C. Patient admitted to medicine service. Final diagnoses:   Generalized abdominal pain        Joycie Peek, PA-C 11/15/14 1610  Tilden Fossa, MD 11/15/14 [phone]

## 2014-11-14 NOTE — ED Notes (Signed)
Patient transported to CT 

## 2014-11-14 NOTE — Consult Note (Signed)
Consultation  Referring Provider: Imogene Burn, Hershal Coria, [facility] Primary Care Physician:  Minerva Ends, MD Primary Gastroenterologist:  Althia Forts, seen late July 2015 by Dr. Fuller Plan  Reason for Consultation:  Persistent and worsening right lower abd pain, abnl imaging with ileitis by CT scan, nausea and vomiting  HPI: Victoria Alvarez is a 24 y.o. female past medical history of questionable hypercoagulable disorder, history of CVA, and most recently acute portal vein thrombosis diagnosed in July 2016 placed on Eliquis who presents to the ER today with severe right-sided abdominal pain associated with nausea and vomiting. CT scan was again performed in the ER and this showed enteritis in the distal ileum with adjacent inflammation and a small amount of fluid. There was mild distention of small bowel loops proximal to the segment of inflammation. No abscess. Colon appeared normal radiographically.  She was last seen by our GI division on 11/03/2014 in consultation for ongoing abdominal pain. It was felt that her prior distal enteritis and at that time right sided colitis was secondary to ischemia in the setting of acute portal vein thrombosis. Treatment was conservative at that time.  She reports that she has continued to have persistent abdominal pain though this became worse yesterday after eating. This is the first time it was associated with nausea and vomiting. Emesis was nonbloody. Bowel movements have still been more constipated than anything else. She has seen specks of blood in her stool. She's been using milk of magnesia to help induce bowel movement. She continues on her blood thinner. She also continues to smoke cigarettes. She denies fever and chills. Denies chest pain or shortness of breath. She is frustrated at her persistent illness and the need to come to the ER multiple times. Also tearful because today is her daughter's fourth birthday  In the ER lactic acid was 2.88  improving to normal after hydration, white count 11.6  She does have one cousin with Crohn's disease   Past Medical History  Diagnosis Date  . Interstitial cystitis     pain from this was reason for starting opioids age 12.   . Chronic pelvic pain in female   . Headache(784.0)   . Pyelonephritis affecting pregnancy in first trimester July 2015  . Depression 2007  . Opioid dependence     to Rx opioids.  switched to Methadone after second child born in 04/2014  . Pneumonia 03/2014  . Seizures   . Stroke 05/2014    presented with right sided weakness.   . Numbness     right side  . Dizziness 07/18/14  . Confusion   . 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 Preeclampsia 2012  . Miscarriage 2005  . Non-compliance     with anticoagulant  . 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 Cerebral venous sinus thrombosis       Family History  Problem Relation Age of Onset  . Diabetes Mother   . Heart disease Mother   . Hypertension Father   . Diabetes Maternal Grandmother   . Hypertension Maternal Grandmother   . Stroke Maternal Grandmother   . Diabetes Maternal Grandfather   . Hypertension Maternal Grandfather   . Diabetes Paternal Grandmother   . Diabetes Paternal Grandfather    Social History  Substance Use Topics  . Smoking status: Current Every Day Smoker -- 0.50 packs/day for 8 years    Types: Cigarettes  . Smokeless tobacco: Never Used     Comment: cutting back  . Alcohol Use:  No   No current facility-administered medications for this encounter.   Current Outpatient Prescriptions  Medication Sig Dispense Refill  . apixaban (ELIQUIS) 5 MG TABS tablet Take 1 tablet (5 mg total) by mouth 2 (two) times daily. 60 tablet 0  . EPINEPHrine 0.3 mg/0.3 mL IJ SOAJ injection Inject 0.3 mLs (0.3 mg total) into the muscle once. 1 Device 1  . FLUoxetine (PROZAC) 20 MG capsule Take 1 capsule (20 mg total) by mouth daily. 30 capsule 3  . levETIRAcetam (KEPPRA) 500 MG tablet Take 1 tablet (500 mg  total) by mouth 2 (two) times daily. 60 tablet 3  . Linaclotide (LINZESS) 145 MCG CAPS capsule Take 1 capsule (145 mcg total) by mouth daily. 30 capsule 0  . methadone (DOLOPHINE) 5 MG tablet Take 17 tablets (85 mg total) by mouth daily. Home medication continued 30 tablet 0  . polyethylene glycol (MIRALAX / GLYCOLAX) packet Take 17 g by mouth daily. 28 each 0   Allergies  Allergen Reactions  . Ativan [Lorazepam] Other (See Comments)    " I don't remember anything."  . Bee Venom Swelling  . Ciprofloxacin Diarrhea and Nausea And Vomiting     Review of Systems: All systems reviewed and negative except where noted in HPI.    Ct Abdomen Pelvis W Contrast  11/14/2014   CLINICAL DATA:  24 year old female with acute abdominal and pelvic pain with vomiting for 1 day.  EXAM: CT ABDOMEN AND PELVIS WITH CONTRAST  TECHNIQUE: Multidetector CT imaging of the abdomen and pelvis was performed using the standard protocol following bolus administration of intravenous contrast.  CONTRAST:  167m OMNIPAQUE IOHEXOL 300 MG/ML  SOLN  COMPARISON:  11/03/2014 and prior CTs  FINDINGS: Lower chest:  Minimal basilar atelectasis noted.  Hepatobiliary: No significant hepatic or gallbladder abnormalities. There is no evidence of biliary dilatation.  Pancreas: Unremarkable  Spleen: Unremarkable  Adrenals/Urinary Tract: Kidneys, adrenal glands and bladder are unremarkable.  Stomach/Bowel: There is circumferential wall thickening of a moderate length segment distal ileum with adjacent inflammation and small amount of interloop fluid. There is mild distention of small bowel loops just proximal to the moderate segment wall thickening. There is no evidence of abscess or pneumoperitoneum. The appendix is normal.  Vascular/Lymphatic: Prominent mesenteric lymph nodes are likely reactive. There is no evidence of vascular abnormality.  Reproductive: Uterus and adnexal regions are unremarkable.  Other: A trace amount of free pelvic fluid is  noted.  Musculoskeletal: No acute or suspicious abnormalities identified.  IMPRESSION: Enteritis involving the distal ileum with adjacent inflammation and small amount of fluid. This likely is inflammatory, infectious or Crohn's disease. No evidence of pneumoperitoneum or abscess.   Electronically Signed   By: JMargarette CanadaM.D.   On: 11/14/2014 10:28   Ct Abdomen Pelvis W Contrast  11/02/2014   CLINICAL DATA:  Abdominal pain since leaving the hospital a few weeks ago. 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  EXAM: CT ABDOMEN AND PELVIS WITH CONTRAST  TECHNIQUE: Multidetector CT imaging of the abdomen and pelvis was performed using the standard protocol following bolus administration of intravenous contrast.  CONTRAST:  1038mOMNIPAQUE IOHEXOL 300 MG/ML  SOLN  COMPARISON:  10/11/2014  FINDINGS: 3 mm nodule in the left lung base without change since prior study.  Small focal fatty infiltration in the liver adjacent to the falciform ligament. Tiny sub cm low-attenuation lesion in the medial segment left lobe unchanged since prior study. This too small to characterize but probably represents a cyst  or hemangioma. No other focal liver lesions. Gallbladder, spleen, pancreas, adrenal glands, kidneys, abdominal aorta, inferior vena cava, and retroperitoneal lymph nodes are unremarkable. Stomach, small bowel, and colon are not abnormally distended. Residual thickening of the wall of the distal ileum. This could represent residua of previous vascular compromise or inflammatory change. Visualized portions of the superior mesenteric artery and vein appear patent. No significant residual portal venous thrombosis. Small amount of free fluid in the lower abdominal mesenteric in the area of thickened bowel. No pneumatosis or portal venous gas. No free air. Mild prominence of mesenteric lymph nodes, probably reactive.  Pelvis: Small amount of free fluid in the pelvis could be reactive or physiologic. Uterus and ovaries  are not enlarged. Mild bladder wall thickening may be due to under distention or cystitis. Appendix is normal. No pelvic mass or lymphadenopathy. No destructive bone lesions.  IMPRESSION: Residual bowel wall thickening in the low abdomen with small amount of adjacent free fluid. This could represent residual vascular compromise or inflammatory process. The previous portal venous thrombosis has resolved.   Electronically Signed   By: Lucienne Capers M.D.   On: 11/02/2014 23:59   Ct Angio Abd/pel W/ And/or W/o  11/03/2014   CLINICAL DATA:  Severe left lower quadrant pain, and nausea. Patient with portal venous and cerebral venous thrombosis.  EXAM: CT ANGIOGRAPHY ABDOMEN AND PELVIS WITH CONTRAST AND WITHOUT CONTRAST  TECHNIQUE: Multidetector CT imaging of the abdomen and pelvis was performed using the standard protocol during bolus administration of intravenous contrast. Multiplanar reconstructed images including MIPs were obtained and reviewed to evaluate the vascular anatomy.  CONTRAST:  100 cc Omnipaque 350.  COMPARISON:  CT of the abdomen pelvis dated 11/02/2014  FINDINGS: Arterial findings  Aorta  Normal in caliber, without filling defects or stenosis.  Celiac axis  Normal in caliber, without filling defects or stenosis.  Superior mesenteric  Normal in caliber, without filling defects or stenosis.  Left renal  Normal in caliber, without filling defects or stenosis. Accessory left renal artery is noted.  Right renal  Normal in caliber, without filling defects or stenosis.  Inferior mesenteric  Normal in caliber, without filling defects or stenosis.  Left iliac  Normal in caliber, without filling defects or stenosis.  Right iliac  Normal in caliber, without filling defects or stenosis.  Venous findings  No evidence of portal venous thrombosis.  Review of the MIP images confirms the above findings.  Nonvascular findings  There is mesenteric stranding and small amount of free fluid within the mid left abdomen.  There is also a long segment of bowel thickening of the distal third of the ileum. The distal most ileum is noted to be distended and fluid-filled with maximum diameter of 2.7 cm. The terminal ileum is prominent. The appendix is normal. The remainder of the upper GI tract and colon are normal. Multiple sub cm rounded mesenteric lymph nodes are seen within the same area of the abdomen. There is a tiny amount of free fluid, tracking along the left pericolic gutter. There is a small amount of free fluid within the pelvis as well.  Focal fatty infiltration along the falciform ligament of the liver is noted, otherwise the liver appears normal. The spleen, pancreas, bilateral adrenal glands, bilateral kidneys are normal. Vicarious excretion of contrast within the otherwise normal gallbladder is noted.  IMPRESSION: No evidence of a arterial or venous thrombosis.  Increased mesenteric stranding with interspersed shotty mesenteric lymph nodes within the mid left abdomen, in the  area of long segment of thickening of the distal ileum. Thickening of the terminal ileum and a focal distention of up to 2.7 cm of the distal ileum leading to it. These findings may represent infectious, inflammatory or ischemic bowel changes, with associated reactive lymphadenopathy. Alternatively mesenteric adenitis is of consideration.   Electronically Signed   By: Fidela Salisbury M.D.   On: 11/03/2014 11:42    Physical Exam: BP 101/56 mmHg  Pulse 69  Temp(Src) 98.4 F (36.9 C) (Oral)  Resp 18  Ht 5' 3"  (1.6 m)  Wt 200 lb 9.9 oz (91 kg)  BMI 35.55 kg/m2  SpO2 98%  LMP  Gen: awake, alert, NAD HEENT: anicteric, op clear Neck: supple CV: RRR, no mrg Pulm: CTA b/l Abd: soft, obese, moderate to severe tenderness in the right abdomen both mid and lower without rebound or guarding, bowel sounds present, overall nondistended  Ext: no c/c/e Neuro: nonfocal Psych: Normal affect and mood though tearful when discussing frustration  with being sick  CBC    Component Value Date/Time   WBC 11.6* 11/14/2014 0726   RBC 5.83* 11/14/2014 0726   HGB 13.4 11/14/2014 0726   HCT 42.4 11/14/2014 0726   PLT 210 11/14/2014 0726   MCV 72.7* 11/14/2014 0726   MCH 23.0* 11/14/2014 0726   MCHC 31.6 11/14/2014 0726   RDW 27.1* 11/14/2014 0726   LYMPHSABS 1.0 10/07/2014 2125   MONOABS 0.3 10/07/2014 2125   EOSABS 0.0 10/07/2014 2125   BASOSABS 0.0 10/07/2014 2125    CMP     Component Value Date/Time   NA 134* 11/14/2014 0726   K 3.9 11/14/2014 0726   CL 101 11/14/2014 0726   CO2 23 11/14/2014 0726   GLUCOSE 119* 11/14/2014 0726   BUN 13 11/14/2014 0726   CREATININE 0.83 11/14/2014 0726   CREATININE 0.81 07/12/2014 1717   CALCIUM 9.0 11/14/2014 0726   PROT 5.7* 11/14/2014 0726   ALBUMIN 3.5 11/14/2014 0726   AST 25 11/14/2014 0726   ALT 19 11/14/2014 0726   ALKPHOS 51 11/14/2014 0726   BILITOT 0.8 11/14/2014 0726   GFRNONAA >60 11/14/2014 0726   GFRNONAA >89 07/12/2014 1717   GFRAA >60 11/14/2014 0726   GFRAA >89 07/12/2014 1717    ASSESSMENT AND PLAN:  24 y.o. female past medical history of questionable hypercoagulable disorder, history of CVA, and most recently acute portal vein thrombosis diagnosed in July 2016 placed on Eliquis who presents to the ER today with severe right-sided abdominal pain associated with nausea and vomiting found to have persistent distal ileal thickening raising the question of IBD versus ischemic injury.  1. Abd pain/N/V/ileitis -- ischemic insult to the distal ileum is certainly possible given recent portal vein thrombosis. However, she has been on anticoagulation and the ileitis has persisted raising the question of an inflammatory bowel disease, specifically Crohn's disease. She has continued to be symptomatic, though there is no evidence for obstruction today. Treatment of ischemic ileitis would be quite different from inflammatory bowel disease and for this reason I recommended direct  visualization with colonoscopy. We discussed the risks, benefits and alternatives and she is agreeable to proceed. We'll try bowel preparation today and if able to adequately prep possible colonoscopy tomorrow otherwise additional bowel prep tomorrow with colonoscopy Wednesday. Given recent thrombosis and history of stroke, will plan colonoscopy on Eliquis. She understands this carries a risk for bleeding. Bowel rest, IV fluids and pain control.  May also need ileal cecectomy, but this would depend on  etiology of inflammation and response to therapy.  2. Portal vein thrombosis -- continue Eliquis

## 2014-11-14 NOTE — H&P (Signed)
[facility] History and Physical                                                                                    Victoria Alvarez, is a 24 y.o. female  MRN: [id number]   DOB - 1990/12/02  Admit Date - 11/14/2014  Outpatient Primary MD for the patient is Victoria Paula, MD  Referring Physician:    Chief Complaint:   Chief Complaint  Patient presents with  . Abdominal Pain  . Emesis     HPI  Victoria Alvarez  is a 24 y.o. 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  [name] has been hospitalized 2x recently for similar symptoms.  She was feeling better after discharge 8/27 but still having cramping pain that was relieved with bowel movements.  Yesterday 9/4 she began having continuous abdominal pain not relieved with bowel movements.  The pain is located thru out her abdomen.  It stops her from eating.  She had 1 episode of vomiting this am (no hematemesis).  Her stools are soft, almost loose, and brown.  She denies fevers, cough, sore throat, chest pain and difficulty breathing.  In the ER her lactic acid is 2.88, wbc is 11.6, CT abdomen/pelvis shows: circumferential wall thickening in the distal ileum.  Review of Systems   In addition to the HPI above,  No Fever-chills, No Headache, No changes with Vision or hearing, No problems swallowing food or Liquids, No Chest pain, Cough or Shortness of Breath, No Blood in stool or Urine, No dysuria, No new skin rashes or bruises, No new joints pains-aches,  No new weakness, tingling, numbness in any extremity, No recent weight gain or loss, A full 10 point Review of Systems was done, except as stated above, all other Review of Systems were negative.  Past Medical History  Past Medical History  Diagnosis Date  . Interstitial cystitis     pain from this was reason for starting opioids age 87.   . Chronic pelvic pain in  female   . Headache(784.0)   . Pyelonephritis affecting pregnancy in first trimester July 2015  . Depression 2007  . Opioid dependence     to Rx opioids.  switched to Methadone after second child born in 04/2014  . Pneumonia 03/2014  . Seizures   . Stroke 05/2014    presented with right sided weakness.   . Numbness     right side  . Dizziness 07/18/14  . Confusion   . 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 Preeclampsia 2012  . Miscarriage 2005  . Non-compliance     with anticoagulant  . 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 Cerebral venous sinus thrombosis     Past Surgical History  Procedure Laterality Date  . Vagina surgery  ~ 2011    benign vaginal tumor removed at [facility].   . Cesarean section N/A 05/06/2014    Procedure: CESAREAN SECTION;  Surgeon: Levie Heritage, DO;  Location: [facility];  Service: Obstetrics;  Laterality: N/A;  . Inguinal hernia repair Left 2008    Social History Social  History  Substance Use Topics  . Smoking status: Current Every Day Smoker -- 0.50 packs/day for 8 years    Types: Cigarettes  . Smokeless tobacco: Never Used     Comment: cutting back  . Alcohol Use: No  Takes 85 mg of methadone daily.  Family History Family History  Problem Relation Age of Onset  . Diabetes Mother   . Heart disease Mother   . Hypertension Father   . Diabetes Maternal Grandmother   . Hypertension Maternal Grandmother   . Stroke Maternal Grandmother   . Diabetes Maternal Grandfather   . Hypertension Maternal Grandfather   . Diabetes Paternal Grandmother   . Diabetes Paternal Grandfather   First Cousin with Crohns Disease.  Prior to Admission medications   Medication Sig Start Date End Date Taking? Authorizing Provider  apixaban (ELIQUIS) 5 MG TABS tablet Take 1 tablet (5 mg total) by mouth 2 (two) times daily. 10/16/14 02/03/15 Yes Leroy Sea, MD  EPINEPHrine 0.3 mg/0.3 mL IJ SOAJ injection Inject 0.3 mLs (0.3 mg total) into the muscle once. 06/16/14  Yes Jaclyn Shaggy, MD  FLUoxetine (PROZAC) 20 MG capsule Take 1 capsule (20 mg total) by mouth daily. 07/18/14  Yes Micki Riley, MD  levETIRAcetam (KEPPRA) 500 MG tablet Take 1 tablet (500 mg total) by mouth 2 (two) times daily. 06/13/14  Yes Richarda Overlie, MD  Linaclotide (LINZESS) 145 MCG CAPS capsule Take 1 capsule (145 mcg total) by mouth daily. 11/06/14  Yes Shanker Levora Dredge, MD  methadone (DOLOPHINE) 5 MG tablet Take 17 tablets (85 mg total) by mouth daily. Home medication continued 10/16/14  Yes Leroy Sea, MD  polyethylene glycol (MIRALAX / GLYCOLAX) packet Take 17 g by mouth daily. 11/06/14  Yes Shanker Levora Dredge, MD    Allergies  Allergen Reactions  . Ativan [Lorazepam] Other (See Comments)    " I don't remember anything."  . Bee Venom Swelling  . Ciprofloxacin Diarrhea and Nausea And Vomiting    Physical Exam  Vitals  Blood pressure 101/56, pulse 69, temperature 98.4 F (36.9 C), temperature source Oral, resp. rate 18, height 5\' 3"  (1.6 m), weight 91 kg (200 lb 9.9 oz), SpO2 98 %, not currently breastfeeding.   General:  Overweight young female,lying in bed in mild distress.  24 year old dtr and husband at bedside.  Psych:  Normal affect and insight, Not Suicidal or Homicidal, Awake Alert, Oriented X 3.  Frustrated over returning to the hospital.  Neuro:   No F.N deficits, ALL C.Nerves Intact, Strength 5/5 all 4 extremities, Sensation intact all 4 extremities.  ENT:  Ears and Eyes appear Normal, Conjunctivae clear, PER. Moist oral mucosa without erythema or exudates.  Neck:  Supple, No lymphadenopathy appreciated  Respiratory:  Symmetrical chest wall movement, Good air movement bilaterally, CTAB.  Cardiac:  RRR, No Murmurs, no LE edema noted, no JVD.    Abdomen:  Decreased bowel sounds, soft, tender to palpation particularly on the right.  No masses.  Skin:  No Cyanosis, Normal Skin Turgor, No Skin Rash or Bruise.  Extremities:  Able to move all 4. 5/5 strength in each,  no  effusions.  Data Review  CBC  Recent Labs Lab 11/14/14 0726  WBC 11.6*  HGB 13.4  HCT 42.4  PLT 210  MCV 72.7*  MCH 23.0*  MCHC 31.6  RDW 27.1*    Chemistries   Recent Labs Lab 11/14/14 0726  NA 134*  K 3.9  CL 101  CO2  23  GLUCOSE 119*  BUN 13  CREATININE 0.83  CALCIUM 9.0  AST 25  ALT 19  ALKPHOS 51  BILITOT 0.8     Urinalysis    Component Value Date/Time   COLORURINE YELLOW 11/14/2014 1211   APPEARANCEUR CLEAR 11/14/2014 1211   LABSPEC >1.046* 11/14/2014 1211   PHURINE 5.5 11/14/2014 1211   GLUCOSEU NEGATIVE 11/14/2014 1211   HGBUR NEGATIVE 11/14/2014 1211   BILIRUBINUR NEGATIVE 11/14/2014 1211   KETONESUR NEGATIVE 11/14/2014 1211   PROTEINUR NEGATIVE 11/14/2014 1211   UROBILINOGEN 0.2 11/14/2014 1211   NITRITE NEGATIVE 11/14/2014 1211   LEUKOCYTESUR NEGATIVE 11/14/2014 1211    Imaging results:   Ct Abdomen Pelvis W Contrast  11/14/2014   CLINICAL DATA:  24 year old female with acute abdominal and pelvic pain with vomiting for 1 day.  EXAM: CT ABDOMEN AND PELVIS WITH CONTRAST  TECHNIQUE: Multidetector CT imaging of the abdomen and pelvis was performed using the standard protocol following bolus administration of intravenous contrast.  CONTRAST:  OMNIPAQUE IOHEXOL 300 MG/ML  SOLN  COMPARISON:  11/03/2014 and prior CTs  FINDINGS: Lower chest:  Minimal basilar atelectasis noted.  Hepatobiliary: No significant hepatic or gallbladder abnormalities. There is no evidence of biliary dilatation.  Pancreas: Unremarkable  Spleen: Unremarkable  Adrenals/Urinary Tract: Kidneys, adrenal glands and bladder are unremarkable.  Stomach/Bowel: There is circumferential wall thickening of a moderate length segment distal ileum with adjacent inflammation and small amount of interloop fluid. There is mild distention of small bowel loops just proximal to the moderate segment wall thickening. There is no evidence of abscess or pneumoperitoneum. The appendix is normal.   Vascular/Lymphatic: Prominent mesenteric lymph nodes are likely reactive. There is no evidence of vascular abnormality.  Reproductive: Uterus and adnexal regions are unremarkable.  Other: A trace amount of free pelvic fluid is noted.  Musculoskeletal: No acute or suspicious abnormalities identified.  IMPRESSION: Enteritis involving the distal ileum with adjacent inflammation and small amount of fluid. This likely is inflammatory, infectious or Crohn's disease. No evidence of pneumoperitoneum or abscess.   Electronically Signed   By: Harmon Pier M.D.   On: 11/14/2014 10:28   Ct Abdomen Pelvis W Contrast  11/02/2014   CLINICAL DATA:  Abdominal pain since leaving the hospital a few weeks ago. 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  EXAM: CT ABDOMEN AND PELVIS WITH CONTRAST  TECHNIQUE: Multidetector CT imaging of the abdomen and pelvis was performed using the standard protocol following bolus administration of intravenous contrast.  CONTRAST:  OMNIPAQUE IOHEXOL 300 MG/ML  SOLN  COMPARISON:  10/11/2014  FINDINGS: 3 mm nodule in the left lung base without change since prior study.  Small focal fatty infiltration in the liver adjacent to the falciform ligament. Tiny sub cm low-attenuation lesion in the medial segment left lobe unchanged since prior study. This too small to characterize but probably represents a cyst or hemangioma. No other focal liver lesions. Gallbladder, spleen, pancreas, adrenal glands, kidneys, abdominal aorta, inferior vena cava, and retroperitoneal lymph nodes are unremarkable. Stomach, small bowel, and colon are not abnormally distended. Residual thickening of the wall of the distal ileum. This could represent residua of previous vascular compromise or inflammatory change. Visualized portions of the superior mesenteric artery and vein appear patent. No significant residual portal venous thrombosis. Small amount of free fluid in the lower abdominal mesenteric in the area of  thickened bowel. No pneumatosis or portal venous gas. No free air. Mild prominence of mesenteric lymph nodes, probably reactive.  Pelvis: Small amount of free fluid in the pelvis could be reactive or physiologic. Uterus and ovaries are not enlarged. Mild bladder wall thickening may be due to under distention or cystitis. Appendix is normal. No pelvic mass or lymphadenopathy. No destructive bone lesions.  IMPRESSION: Residual bowel wall thickening in the low abdomen with small amount of adjacent free fluid. This could represent residual vascular compromise or inflammatory process. The previous portal venous thrombosis has resolved.   Electronically Signed   By: Burman Nieves M.D.   On: 11/02/2014 23:59   Ct Angio Abd/pel W/ And/or W/o  11/03/2014   CLINICAL DATA:  Severe left lower quadrant pain, and nausea. Patient with portal venous and cerebral venous thrombosis.  EXAM: CT ANGIOGRAPHY ABDOMEN AND PELVIS WITH CONTRAST AND WITHOUT CONTRAST  TECHNIQUE: Multidetector CT imaging of the abdomen and pelvis was performed using the standard protocol during bolus administration of intravenous contrast. Multiplanar reconstructed images including MIPs were obtained and reviewed to evaluate the vascular anatomy.  CONTRAST:  100 cc Omnipaque 350.  COMPARISON:  CT of the abdomen pelvis dated 11/02/2014  FINDINGS: Arterial findings  Aorta  Normal in caliber, without filling defects or stenosis.  Celiac axis  Normal in caliber, without filling defects or stenosis.  Superior mesenteric  Normal in caliber, without filling defects or stenosis.  Left renal  Normal in caliber, without filling defects or stenosis. Accessory left renal artery is noted.  Right renal  Normal in caliber, without filling defects or stenosis.  Inferior mesenteric  Normal in caliber, without filling defects or stenosis.  Left iliac  Normal in caliber, without filling defects or stenosis.  Right iliac  Normal in caliber, without filling defects or  stenosis.  Venous findings  No evidence of portal venous thrombosis.  Review of the MIP images confirms the above findings.  Nonvascular findings  There is mesenteric stranding and small amount of free fluid within the mid left abdomen. There is also a long segment of bowel thickening of the distal third of the ileum. The distal most ileum is noted to be distended and fluid-filled with maximum diameter of 2.7 cm. The terminal ileum is prominent. The appendix is normal. The remainder of the upper GI tract and colon are normal. Multiple sub cm rounded mesenteric lymph nodes are seen within the same area of the abdomen. There is a tiny amount of free fluid, tracking along the left pericolic gutter. There is a small amount of free fluid within the pelvis as well.  Focal fatty infiltration along the falciform ligament of the liver is noted, otherwise the liver appears normal. The spleen, pancreas, bilateral adrenal glands, bilateral kidneys are normal. Vicarious excretion of contrast within the otherwise normal gallbladder is noted.  IMPRESSION: No evidence of a arterial or venous thrombosis.  Increased mesenteric stranding with interspersed shotty mesenteric lymph nodes within the mid left abdomen, in the area of long segment of thickening of the distal ileum. Thickening of the terminal ileum and a focal distention of up to 2.7 cm of the distal ileum leading to it. These findings may represent infectious, inflammatory or ischemic bowel changes, with associated reactive lymphadenopathy. Alternatively mesenteric adenitis is of consideration.   Electronically Signed   By: Ted Mcalpine M.D.   On: 11/03/2014 11:42    My personal review of EKG: no EKG performed.   Assessment & Plan  Principal Problem:   Abdominal pain Active Problems:   Portal vein thrombosis   Enteritis   Lactic acidosis  Methadone maintenance therapy patient   Tobacco abuse   Seizure   Cerebral venous sinus thrombosis   Abdominal  Pain secondary to Enteritis Abnormal CT scan.  [facility] consulted. Planning colonoscopy for 9/6. Will admit.  Start on Clears, Flagyl (allergy to Cipro).   Continue on Eliquis Hydromorphone 1 mg q 4 hours PRN severe pain.  Lactic Acidosis Likely due to acute process currently going on in her bowel. Resolved with 2L IVF.  Left Portal Vein Thrombosis. Diagnosed 10/11/14.  Currently on Eliquis.  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  Seizure No current activity. Continue Keppra  Chronic Pain / Hx of interstitial cystitis Continue 85 mg methadone daily.  Tobacco Abuse Nicotine Patch  History of depression Appears stable, denies any suicidal/homicidal ideation. Continue Prozac  Questionable hypercoagulable state. Hx of miscarriage, stroke, multiple thrombosis.  On Eliquis. Was scheduled to see Hematology in August.  Unfortunately she was in the hospital and missed her appt. Recommend hematology follow up.   Consultants Called:  [facility]  Family Communication:   Husband at bedside.  Code Status:  Full  Condition:  Guarded.  Potential Disposition:  To home in approx 3 days pending work up results/treatment.  Time spent in minutes : 60   Algis Downs,  PA-C on 11/14/2014 at 1:40 PM Between 7am to 7pm - Pager - [phone] After 7pm go to [email or web address] - password [email or web address] And look for the night coverage person covering me after hours  [facility]  Addendum  I personally value patient on 11/14/2014 and agree with above findings. Patient is a 24 year old female with a history of portal vein thrombosis on Eliquis therapy, cerebral venous sinus thrombosis, chronic pain on methadone, recently hospitalized from 11/02/2014 through 10/29/2014 at which time she presented with abdominal pain. She presents to the emergency department with complaints of ongoing abdominal pain located generalized abdomen. On exam she appears uncomfortable, requesting IV pain  medication. She reported having episode of nausea and vomiting this morning. She also reports having episode of bloody stools. Workup in the emergency department included a CT scan of abdomen and pelvis that showed wall thickening and distal ileum. She was seen and evaluated by Dr. Rhea Belton of GI. Plan for colonoscopy in a.m.

## 2014-11-15 ENCOUNTER — Inpatient Hospital Stay (HOSPITAL_COMMUNITY): Payer: Medicaid Other

## 2014-11-15 DIAGNOSIS — E872 Acidosis: Secondary | ICD-10-CM

## 2014-11-15 DIAGNOSIS — R569 Unspecified convulsions: Secondary | ICD-10-CM

## 2014-11-15 DIAGNOSIS — G08 Intracranial and intraspinal phlebitis and thrombophlebitis: Secondary | ICD-10-CM

## 2014-11-15 DIAGNOSIS — K529 Noninfective gastroenteritis and colitis, unspecified: Secondary | ICD-10-CM

## 2014-11-15 LAB — BASIC METABOLIC PANEL
Anion gap: 4 — ABNORMAL LOW (ref 5–15)
BUN: <5 mg/dL — ABNORMAL LOW (ref 6–20)
CO2: 24 mmol/L (ref 22–32)
Calcium: 8.1 mg/dL — ABNORMAL LOW (ref 8.9–10.3)
Chloride: 110 mmol/L (ref 101–111)
Creatinine, Ser: 0.78 mg/dL (ref 0.44–1.00)
GFR calc Af Amer: >60 mL/min (ref >60)
GFR calc non Af Amer: >60 mL/min (ref >60)
Glucose, Bld: 92 mg/dL (ref 65–99)
Potassium: 3.9 mmol/L (ref 3.5–5.1)
Sodium: 138 mmol/L (ref 135–145)

## 2014-11-15 LAB — CBC
HCT: 34.1 % — ABNORMAL LOW (ref 36.0–46.0)
Hemoglobin: 10.4 g/dL — ABNORMAL LOW (ref 12.0–15.0)
MCH: 22.6 pg — ABNORMAL LOW (ref 26.0–34.0)
MCHC: 30.5 g/dL (ref 30.0–36.0)
MCV: 74.1 fL — ABNORMAL LOW (ref 78.0–100.0)
Platelets: 166 10*3/uL (ref 150–400)
RBC: 4.6 MIL/uL (ref 3.87–5.11)
RDW: 27.2 % — ABNORMAL HIGH (ref 11.5–15.5)
WBC: 2.3 10*3/uL — ABNORMAL LOW (ref 4.0–10.5)

## 2014-11-15 MED ORDER — PEG-KCL-NACL-NASULF-NA ASC-C 100 G PO SOLR
0.5000 | Freq: Once | ORAL | Status: AC
  Administered 2014-11-15: 100 g via ORAL
  Filled 2014-11-15: qty 1

## 2014-11-15 MED ORDER — PEG-KCL-NACL-NASULF-NA ASC-C 100 G PO SOLR
0.5000 | Freq: Once | ORAL | Status: AC
  Administered 2014-11-16: 100 g via ORAL

## 2014-11-15 MED ORDER — PEG-KCL-NACL-NASULF-NA ASC-C 100 G PO SOLR: 1.0000 | Freq: Once | ORAL | Status: DC

## 2014-11-15 NOTE — Progress Notes (Signed)
[facility] Progress Note    Since last GI note: Seemed to be sleeping but when she awoke she began to moan with abdominal pains.   There was about 1/2 filled contained of moviprep on her tray table.  The colonoscopy was cancelled for this morning because no stool output with prep.  Objective: Vital signs in last 24 hours: Temp:  [97.6 F (36.4 C)-98.9 F (37.2 C)] 97.6 F (36.4 C) (09/06 0511) Pulse Rate:  [60-69] 62 (09/05 2115) Resp:  [18-20] 20 (09/06 0511) BP: (95-109)/(46-77) 95/53 mmHg (09/06 0511) SpO2:  [96 %-100 %] 100 % (09/06 0511) Weight:  [201 lb (91.173 kg)] 201 lb (91.173 kg) (09/05 1432) Last BM Date: 11/13/14 General: alert and oriented times 3 Heart: regular rate and rythm Abdomen: soft, non-tender, non-distended, normal bowel sounds   Lab Results:  Recent Labs  11/14/14 0726 11/15/14 0452  WBC 11.6* 2.3*  HGB 13.4 10.4*  PLT 210 166  MCV 72.7* 74.1*    Recent Labs  11/14/14 0726 11/15/14 0452  NA 134* 138  K 3.9 3.9  CL 101 110  CO2 23 24  GLUCOSE 119* 92  BUN 13 <5*  CREATININE 0.83 0.78  CALCIUM 9.0 8.1*    Recent Labs  11/14/14 0726  PROT 5.7*  ALBUMIN 3.5  AST 25  ALT 19  ALKPHOS 51  BILITOT 0.8    Studies/Results: Dg Abd 1 View  11/15/2014   CLINICAL DATA:  Lower abdominal pain  EXAM: ABDOMEN - 1 VIEW  COMPARISON:  Abdominal radiographs October 14, 2014; CT abdomen and pelvis November 14, 2014  FINDINGS: There is moderate stool in the colon. There is no bowel dilatation or air-fluid level suggesting obstruction. No free air is seen on this supine examination. There are surgical clips in the left pelvic region.  IMPRESSION: Bowel gas pattern overall unremarkable.   Electronically Signed   By: Lowella Grip III M.D.   On: 11/15/2014 12:45   Ct Abdomen Pelvis W Contrast  11/14/2014   CLINICAL DATA:  24 year old female with acute abdominal and pelvic pain with vomiting for 1 day.  EXAM: CT ABDOMEN AND PELVIS WITH  CONTRAST  TECHNIQUE: Multidetector CT imaging of the abdomen and pelvis was performed using the standard protocol following bolus administration of intravenous contrast.  CONTRAST:  189m OMNIPAQUE IOHEXOL 300 MG/ML  SOLN  COMPARISON:  11/03/2014 and prior CTs  FINDINGS: Lower chest:  Minimal basilar atelectasis noted.  Hepatobiliary: No significant hepatic or gallbladder abnormalities. There is no evidence of biliary dilatation.  Pancreas: Unremarkable  Spleen: Unremarkable  Adrenals/Urinary Tract: Kidneys, adrenal glands and bladder are unremarkable.  Stomach/Bowel: There is circumferential wall thickening of a moderate length segment distal ileum with adjacent inflammation and small amount of interloop fluid. There is mild distention of small bowel loops just proximal to the moderate segment wall thickening. There is no evidence of abscess or pneumoperitoneum. The appendix is normal.  Vascular/Lymphatic: Prominent mesenteric lymph nodes are likely reactive. There is no evidence of vascular abnormality.  Reproductive: Uterus and adnexal regions are unremarkable.  Other: A trace amount of free pelvic fluid is noted.  Musculoskeletal: No acute or suspicious abnormalities identified.  IMPRESSION: Enteritis involving the distal ileum with adjacent inflammation and small amount of fluid. This likely is inflammatory, infectious or Crohn's disease. No evidence of pneumoperitoneum or abscess.   Electronically Signed   By: JMargarette CanadaM.D.   On: 11/14/2014 10:28     Medications: Scheduled Meds: . apixaban  5  mg Oral BID  . FLUoxetine  20 mg Oral Daily  . Influenza vac split quadrivalent PF  0.5 mL Intramuscular Tomorrow-1000  . levETIRAcetam  500 mg Oral BID  . Linaclotide  145 mcg Oral Daily  . methadone  85 mg Oral Daily  . metronidazole  500 mg Intravenous Q8H  . nicotine  21 mg Transdermal Daily   Continuous Infusions: . 0.9 % NaCl with KCl 20 mEq / L 75 mL/hr at 11/15/14 0643   PRN  Meds:.acetaminophen **OR** acetaminophen, HYDROmorphone (DILAUDID) injection, ondansetron **OR** ondansetron (ZOFRAN) IV    Assessment/Plan: 24 y.o. female with abnormal distal small bowel in setting of recent portal vein thrombus  She did not drink all the prep last night or this morning.  I reiterated that she really needs to drink all of it to clean out her colon for the colonoscopy we recommended.  Will reorder another split dose prep to start tonight and complete in AM.  She is currently scheduled for MAC colonoscopy at 11:30 tomorrow. Will not be stopping her xarelto given for the procedure since she has had several recent blood clots.  Unclear what is causing such significant pains.    Milus Banister, MD  11/15/2014, 1:15 PM [facility] Pager [phone]

## 2014-11-15 NOTE — Progress Notes (Signed)
PROGRESS NOTE  Victoria Alvarez OHY:[id number] DOB: 02-18-1991 DOA: 11/14/2014 PCP: Minerva Ends, MD  HPI/Recap of past 24 hours:  Colonoscopy cancelled due to inadequate prep Continued c/o ab pain, kub unremarkable  Assessment/Plan: Principal Problem:   Abdominal pain Active Problems:   Tobacco abuse   Seizure   Methadone maintenance therapy patient   Portal vein thrombosis   Cerebral venous sinus thrombosis   Enteritis   Lactic acidosis  Abdominal Pain secondary to Enteritis Abnormal CT scan.  on Clears, Flagyl (allergy to Cipro).  Continue on Eliquis Hydromorphone 1 mg q 4 hours PRN severe pain. [facility] consulted. colonoscopy cancelled today due to inadequate prep, attempt colonoscope tomorrow on 9/7.  Lactic Acidosis Likely due to acute process currently going on in her bowel. Resolved with 2L IVF.  Left Portal Vein Thrombosis. Diagnosed 10/11/14. Currently on Eliquis.  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 Right sided weakness has resolved, no residual neurologic deficit per patient.  Seizure No current activity. Continue Keppra  Chronic Pain / Hx of interstitial cystitis Continue 85 mg methadone daily.  Tobacco Abuse Nicotine Patch  History of depression Appears stable, denies any suicidal/homicidal ideation. Continue Prozac. Check tsh.  Questionable hypercoagulable state. Hx of miscarriage, stroke, multiple thrombosis.  On Eliquis. Was scheduled to see Hematology in August. Unfortunately she was in the hospital and missed her appt. Recommend hematology follow up.    Condition: Guarded.   Code Status: full  Family Communication: patient   Disposition Plan: home when medically stable   Consultants:  [facility]  Procedures:  colonoscopy 9/7 11am  Antibiotics:  Flagyl, allergic to cipro.    Objective: BP 95/53 mmHg  Pulse 62  Temp(Src) 97.6 F (36.4 C) (Oral)  Resp 20  Ht 5' 3"  (1.6 m)  Wt 201 lb (91.173  kg)  BMI 35.61 kg/m2  SpO2 100%  LMP   Intake/Output Summary (Last 24 hours) at 11/15/14 1440 Last data filed at 11/15/14 0643  Gross per 24 hour  Intake 2181.25 ml  Output      0 ml  Net 2181.25 ml   Filed Weights   11/14/14 0715 11/14/14 1432  Weight: 200 lb 9.9 oz (91 kg) 201 lb (91.173 kg)    Exam:   General:  NAD  Cardiovascular: RRR  Respiratory: CTABL  Abdomen: Soft/ND/NT, positive BS  Musculoskeletal: No Edema  Neuro:   Data Reviewed: Basic Metabolic Panel:  Recent Labs Lab 11/14/14 0726 11/15/14 0452  NA 134* 138  K 3.9 3.9  CL 101 110  CO2 23 24  GLUCOSE 119* 92  BUN 13 <5*  CREATININE 0.83 0.78  CALCIUM 9.0 8.1*   Liver Function Tests:  Recent Labs Lab 11/14/14 0726  AST 25  ALT 19  ALKPHOS 51  BILITOT 0.8  PROT 5.7*  ALBUMIN 3.5    Recent Labs Lab 11/14/14 0726  LIPASE 30   No results for input(s): AMMONIA in the last 168 hours. CBC:  Recent Labs Lab 11/14/14 0726 11/15/14 0452  WBC 11.6* 2.3*  HGB 13.4 10.4*  HCT 42.4 34.1*  MCV 72.7* 74.1*  PLT 210 166   Cardiac Enzymes:   No results for input(s): CKTOTAL, CKMB, CKMBINDEX, TROPONINI in the last 168 hours. BNP (last 3 results) No results for input(s): BNP in the last 8760 hours.  ProBNP (last 3 results) No results for input(s): PROBNP in the last 8760 hours.  CBG: No results for input(s): GLUCAP in the last 168 hours.  No results found  for this or any previous visit (from the past 240 hour(s)).   Studies: Dg Abd 1 View  11/15/2014   CLINICAL DATA:  Lower abdominal pain  EXAM: ABDOMEN - 1 VIEW  COMPARISON:  Abdominal radiographs October 14, 2014; CT abdomen and pelvis November 14, 2014  FINDINGS: There is moderate stool in the colon. There is no bowel dilatation or air-fluid level suggesting obstruction. No free air is seen on this supine examination. There are surgical clips in the left pelvic region.  IMPRESSION: Bowel gas pattern overall unremarkable.    Electronically Signed   By: Lowella Grip III M.D.   On: 11/15/2014 12:45    Scheduled Meds: . apixaban  5 mg Oral BID  . FLUoxetine  20 mg Oral Daily  . Influenza vac split quadrivalent PF  0.5 mL Intramuscular Tomorrow-1000  . levETIRAcetam  500 mg Oral BID  . Linaclotide  145 mcg Oral Daily  . methadone  85 mg Oral Daily  . metronidazole  500 mg Intravenous Q8H  . nicotine  21 mg Transdermal Daily  . peg 3350 powder  0.5 kit Oral Once   And  . [START ON 11/16/2014] peg 3350 powder  0.5 kit Oral Once    Continuous Infusions: . 0.9 % NaCl with KCl 20 mEq / L 75 mL/hr at 11/15/14 5909     Time spent: 42mns  Stellah Donovan MD, PhD  [facility] Pager [phone] If 7PM-7AM, please contact night-coverage at [email or web address], password [email or web address]9/08/2014, 2:40 PM  LOS: 1 day

## 2014-11-15 NOTE — Anesthesia Preprocedure Evaluation (Addendum)
Anesthesia Evaluation  Patient identified by MRN, date of birth, ID band Patient awake    Reviewed: Allergy & Precautions, NPO status , Patient's Chart, lab work & pertinent test results  History of Anesthesia Complications Negative for: history of anesthetic complications  Airway Mallampati: III       Dental no notable dental hx. (+) Teeth Intact, Dental Advisory Given   Pulmonary Current Smoker,  breath sounds clear to auscultation  Pulmonary exam normal       Cardiovascular hypertension, + Peripheral Vascular Disease  Normal cardiovascular examRhythm:Regular Rate:Normal     Neuro/Psych  Headaches, Seizures -, Well Controlled,  PSYCHIATRIC DISORDERS Anxiety Depression CVA, No Residual Symptoms    GI/Hepatic Bowel prep,(+)     substance abuse (opioid abuse, currently on methadone)  ,   Endo/Other  negative endocrine ROS  Renal/GU  Bladder dysfunction  Interstitial cystitis    Musculoskeletal negative musculoskeletal ROS (+)   Abdominal   Peds negative pediatric ROS (+)  Hematology  (+) anemia ,   Anesthesia Other Findings   Reproductive/Obstetrics negative OB ROS                        Anesthesia Physical Anesthesia Plan  ASA: III  Anesthesia Plan: MAC   Post-op Pain Management:    Induction: Intravenous  Airway Management Planned: Nasal Cannula  Additional Equipment:   Intra-op Plan:   Post-operative Plan:   Informed Consent: I have reviewed the patients History and Physical, chart, labs and discussed the procedure including the risks, benefits and alternatives for the proposed anesthesia with the patient or authorized representative who has indicated his/her understanding and acceptance.   Dental advisory given  Plan Discussed with: CRNA and Anesthesiologist  Anesthesia Plan Comments: (Discussed risks/benefits/alternatives to MAC sedation including need for ventilatory  support, hypotension, need for conversion to general anesthesia.  All patient questions answered.  Patient wished to proceed.)      Anesthesia Quick Evaluation

## 2014-11-16 ENCOUNTER — Encounter (HOSPITAL_COMMUNITY)
Admission: EM | Disposition: A | Discharge status: Home / Self Care | Payer: Self-pay | Source: Home / Self Care | Attending: Family Medicine

## 2014-11-16 ENCOUNTER — Inpatient Hospital Stay (HOSPITAL_COMMUNITY): Payer: Medicaid Other | Admitting: Anesthesiology

## 2014-11-16 ENCOUNTER — Encounter (HOSPITAL_COMMUNITY): Payer: Self-pay | Admitting: Gastroenterology

## 2014-11-16 DIAGNOSIS — R1084 Generalized abdominal pain: Secondary | ICD-10-CM

## 2014-11-16 LAB — BASIC METABOLIC PANEL
Anion gap: 5 (ref 5–15)
BUN: <5 mg/dL — ABNORMAL LOW (ref 6–20)
CO2: 26 mmol/L (ref 22–32)
Calcium: 8.2 mg/dL — ABNORMAL LOW (ref 8.9–10.3)
Chloride: 109 mmol/L (ref 101–111)
Creatinine, Ser: 0.86 mg/dL (ref 0.44–1.00)
GFR calc Af Amer: >60 mL/min (ref >60)
GFR calc non Af Amer: >60 mL/min (ref >60)
Glucose, Bld: 86 mg/dL (ref 65–99)
Potassium: 4.1 mmol/L (ref 3.5–5.1)
Sodium: 140 mmol/L (ref 135–145)

## 2014-11-16 LAB — CBC WITH DIFFERENTIAL/PLATELET
Basophils Absolute: 0 10*3/uL (ref 0.0–0.1)
Basophils Relative: 0 % (ref 0–1)
Eosinophils Absolute: 0.1 10*3/uL (ref 0.0–0.7)
Eosinophils Relative: 2 % (ref 0–5)
HCT: 38.3 % (ref 36.0–46.0)
Hemoglobin: 12.1 g/dL (ref 12.0–15.0)
Lymphocytes Relative: 46 % (ref 12–46)
Lymphs Abs: 2 10*3/uL (ref 0.7–4.0)
MCH: 23.3 pg — ABNORMAL LOW (ref 26.0–34.0)
MCHC: 31.6 g/dL (ref 30.0–36.0)
MCV: 73.7 fL — ABNORMAL LOW (ref 78.0–100.0)
Monocytes Absolute: 0.3 10*3/uL (ref 0.1–1.0)
Monocytes Relative: 6 % (ref 3–12)
Neutro Abs: 1.9 10*3/uL (ref 1.7–7.7)
Neutrophils Relative %: 46 % (ref 43–77)
Platelets: 195 10*3/uL (ref 150–400)
RBC: 5.2 MIL/uL — ABNORMAL HIGH (ref 3.87–5.11)
RDW: 26.6 % — ABNORMAL HIGH (ref 11.5–15.5)
WBC: 4.3 10*3/uL (ref 4.0–10.5)

## 2014-11-16 LAB — CBC
HCT: 34.5 % — ABNORMAL LOW (ref 36.0–46.0)
Hemoglobin: 10.8 g/dL — ABNORMAL LOW (ref 12.0–15.0)
MCH: 23.1 pg — ABNORMAL LOW (ref 26.0–34.0)
MCHC: 31.3 g/dL (ref 30.0–36.0)
MCV: 73.7 fL — ABNORMAL LOW (ref 78.0–100.0)
Platelets: 173 10*3/uL (ref 150–400)
RBC: 4.68 MIL/uL (ref 3.87–5.11)
RDW: 27.1 % — ABNORMAL HIGH (ref 11.5–15.5)
WBC: 3.4 10*3/uL — ABNORMAL LOW (ref 4.0–10.5)

## 2014-11-16 LAB — TSH: TSH: 4.397 u[IU]/mL (ref 0.350–4.500)

## 2014-11-16 LAB — MAGNESIUM: Magnesium: 1.8 mg/dL (ref 1.7–2.4)

## 2014-11-16 LAB — LACTIC ACID, PLASMA: Lactic Acid, Venous: 1.6 mmol/L (ref 0.5–2.0)

## 2014-11-16 LAB — C-REACTIVE PROTEIN: CRP: <0.5 mg/dL (ref <1.0)

## 2014-11-16 LAB — SEDIMENTATION RATE: Sed Rate: 1 mm/hr (ref 0–22)

## 2014-11-16 SURGERY — CANCELLED PROCEDURE
Anesthesia: Monitor Anesthesia Care

## 2014-11-16 MED ORDER — LACTATED RINGERS IV SOLN
INTRAVENOUS | Status: DC | PRN
  Administered 2014-11-16: 12:00:00 via INTRAVENOUS

## 2014-11-16 MED ORDER — PEG-KCL-NACL-NASULF-NA ASC-C 100 G PO SOLR
0.5000 | Freq: Once | ORAL | Status: AC
  Administered 2014-11-17: 100 g via ORAL

## 2014-11-16 MED ORDER — PEG-KCL-NACL-NASULF-NA ASC-C 100 G PO SOLR
0.5000 | Freq: Once | ORAL | Status: AC
  Administered 2014-11-16: 100 g via ORAL
  Filled 2014-11-16: qty 1

## 2014-11-16 MED ORDER — MAGNESIUM HYDROXIDE 400 MG/5ML PO SUSP
960.0000 mL | Freq: Two times a day (BID) | ORAL | Status: DC
  Administered 2014-11-16: 960 mL via RECTAL
  Filled 2014-11-16 (×3): qty 240

## 2014-11-16 MED ORDER — PEG-KCL-NACL-NASULF-NA ASC-C 100 G PO SOLR: 1.0000 | Freq: Once | ORAL | Status: DC

## 2014-11-16 MED ORDER — SODIUM CHLORIDE 0.9 % IV SOLN: INTRAVENOUS | Status: DC

## 2014-11-16 MED ORDER — MIDAZOLAM HCL 5 MG/5ML IJ SOLN
INTRAMUSCULAR | Status: DC | PRN
  Administered 2014-11-16: 2 mg via INTRAVENOUS

## 2014-11-16 MED ORDER — SORBITOL 70 % SOLN
960.0000 mL | TOPICAL_OIL | Freq: Two times a day (BID) | ORAL | Status: AC
  Administered 2014-11-17: 960 mL via RECTAL
  Filled 2014-11-16: qty 240

## 2014-11-16 MED ORDER — PROPOFOL INFUSION 10 MG/ML OPTIME
INTRAVENOUS | Status: DC | PRN
  Administered 2014-11-16: 100 ug/kg/min via INTRAVENOUS

## 2014-11-16 MED ORDER — LACTATED RINGERS IV SOLN
Freq: Once | INTRAVENOUS | Status: AC
  Administered 2014-11-16: 1000 mL via INTRAVENOUS

## 2014-11-16 MED ORDER — FENTANYL CITRATE (PF) 100 MCG/2ML IJ SOLN
25.0000 ug | INTRAMUSCULAR | Status: DC | PRN
  Administered 2014-11-16: 50 ug via INTRAVENOUS

## 2014-11-16 MED ORDER — ONDANSETRON HCL 4 MG/2ML IJ SOLN: 4.0000 mg | Freq: Once | INTRAMUSCULAR | Status: DC | PRN

## 2014-11-16 MED ORDER — PROPOFOL 10 MG/ML IV BOLUS
INTRAVENOUS | Status: DC | PRN
  Administered 2014-11-16 (×2): 30 mg via INTRAVENOUS

## 2014-11-16 MED ORDER — HYDROMORPHONE HCL 1 MG/ML IJ SOLN
1.0000 mg | Freq: Once | INTRAMUSCULAR | Status: AC
  Administered 2014-11-16: 1 mg via INTRAVENOUS

## 2014-11-16 NOTE — Op Note (Signed)
[facility] [place], [id number]   COLONOSCOPY PROCEDURE REPORT  PATIENT: Victoria Alvarez, Victoria Alvarez  MR#: [id number] BIRTHDATE: 1990/09/22 , 24  yrs. old GENDER: female ENDOSCOPIST: Milus Banister, M PROCEDURE DATE:  11/16/2014 PROCEDURE:   Colonoscopy, diagnostic (INCOMPLETE COLONOSCOPY DUE TO VERY POOR PREP) First Screening Colonoscopy - Avg.  risk and is 50 yrs.  old or older - No.  Prior Negative Screening - Now for repeat screening. N/A  History of Adenoma - Now for follow-up colonoscopy & has been > or = to 3 yrs.  N/A ASA CLASS:   Class II INDICATIONS:abd pain, thickened and edematous distal ileum, in setting of recent PV thrombus and other DVT. MEDICATIONS: Monitored anesthesia care  DESCRIPTION OF PROCEDURE:   After the risks benefits and alternatives of the procedure were thoroughly explained, informed consent was obtained.  The digital rectal exam revealed no abnormalities of the rectum.   The Pentax Adult Colon [id number] endoscope was introduced through the anus and advanced to the sigmoid colon. No adverse events experienced.   Limited by poor preparation.   The quality of the prep was poor.  The instrument was then slowly withdrawn as the colon was fully examined. Estimated blood loss is zero unless otherwise noted in this procedure report.    COLON FINDINGS: There was a large amount of solid brown stool filling the sigmoid colon.  I was not able to navigate proximal to the stool.  The procedure was terminated.   The time to cecum = NA   Withdrawal time =  NA  The scope was withdrawn and the procedure completed. COMPLICATIONS: There were no immediate complications.  ENDOSCOPIC IMPRESSION: There was a large amount of solid brown stool filling the sigmoid colon.  I was not able to navigate proximal to the stool.  The procedure was terminated.  THIS WAS AN INCOMPLETE PREP DUE TO VERY POOR PREP  RECOMMENDATIONS: SMOG enemas (times  two) today and will order another prep for tonight. This will be her third attempt to prep for colonoscopy. It is not clear if she is completely drinking the prep.  eSigned:  Milus Banister, MD 11/16/2014 12:55 PM

## 2014-11-16 NOTE — H&P (View-Only) (Signed)
[facility] Progress Note    Since last GI note: Seemed to be sleeping but when she awoke she began to moan with abdominal pains.   There was about 1/2 filled contained of moviprep on her tray table.  The colonoscopy was cancelled for this morning because no stool output with prep.  Objective: Vital signs in last 24 hours: Temp:  [97.6 F (36.4 C)-98.9 F (37.2 C)] 97.6 F (36.4 C) (09/06 0511) Pulse Rate:  [60-69] 62 (09/05 2115) Resp:  [18-20] 20 (09/06 0511) BP: (95-109)/(46-77) 95/53 mmHg (09/06 0511) SpO2:  [96 %-100 %] 100 % (09/06 0511) Weight:  [201 lb (91.173 kg)] 201 lb (91.173 kg) (09/05 1432) Last BM Date: 11/13/14 General: alert and oriented times 3 Heart: regular rate and rythm Abdomen: soft, non-tender, non-distended, normal bowel sounds   Lab Results:  Recent Labs  11/14/14 0726 11/15/14 0452  WBC 11.6* 2.3*  HGB 13.4 10.4*  PLT 210 166  MCV 72.7* 74.1*    Recent Labs  11/14/14 0726 11/15/14 0452  NA 134* 138  K 3.9 3.9  CL 101 110  CO2 23 24  GLUCOSE 119* 92  BUN 13 <5*  CREATININE 0.83 0.78  CALCIUM 9.0 8.1*    Recent Labs  11/14/14 0726  PROT 5.7*  ALBUMIN 3.5  AST 25  ALT 19  ALKPHOS 51  BILITOT 0.8    Studies/Results: Dg Abd 1 View  11/15/2014   CLINICAL DATA:  Lower abdominal pain  EXAM: ABDOMEN - 1 VIEW  COMPARISON:  Abdominal radiographs October 14, 2014; CT abdomen and pelvis November 14, 2014  FINDINGS: There is moderate stool in the colon. There is no bowel dilatation or air-fluid level suggesting obstruction. No free air is seen on this supine examination. There are surgical clips in the left pelvic region.  IMPRESSION: Bowel gas pattern overall unremarkable.   Electronically Signed   By: Lowella Grip III M.D.   On: 11/15/2014 12:45   Ct Abdomen Pelvis W Contrast  11/14/2014   CLINICAL DATA:  24 year old female with acute abdominal and pelvic pain with vomiting for 1 day.  EXAM: CT ABDOMEN AND PELVIS WITH  CONTRAST  TECHNIQUE: Multidetector CT imaging of the abdomen and pelvis was performed using the standard protocol following bolus administration of intravenous contrast.  CONTRAST:  132m OMNIPAQUE IOHEXOL 300 MG/ML  SOLN  COMPARISON:  11/03/2014 and prior CTs  FINDINGS: Lower chest:  Minimal basilar atelectasis noted.  Hepatobiliary: No significant hepatic or gallbladder abnormalities. There is no evidence of biliary dilatation.  Pancreas: Unremarkable  Spleen: Unremarkable  Adrenals/Urinary Tract: Kidneys, adrenal glands and bladder are unremarkable.  Stomach/Bowel: There is circumferential wall thickening of a moderate length segment distal ileum with adjacent inflammation and small amount of interloop fluid. There is mild distention of small bowel loops just proximal to the moderate segment wall thickening. There is no evidence of abscess or pneumoperitoneum. The appendix is normal.  Vascular/Lymphatic: Prominent mesenteric lymph nodes are likely reactive. There is no evidence of vascular abnormality.  Reproductive: Uterus and adnexal regions are unremarkable.  Other: A trace amount of free pelvic fluid is noted.  Musculoskeletal: No acute or suspicious abnormalities identified.  IMPRESSION: Enteritis involving the distal ileum with adjacent inflammation and small amount of fluid. This likely is inflammatory, infectious or Crohn's disease. No evidence of pneumoperitoneum or abscess.   Electronically Signed   By: JMargarette CanadaM.D.   On: 11/14/2014 10:28     Medications: Scheduled Meds: . apixaban  5  mg Oral BID  . FLUoxetine  20 mg Oral Daily  . Influenza vac split quadrivalent PF  0.5 mL Intramuscular Tomorrow-1000  . levETIRAcetam  500 mg Oral BID  . Linaclotide  145 mcg Oral Daily  . methadone  85 mg Oral Daily  . metronidazole  500 mg Intravenous Q8H  . nicotine  21 mg Transdermal Daily   Continuous Infusions: . 0.9 % NaCl with KCl 20 mEq / L 75 mL/hr at 11/15/14 0643   PRN  Meds:.acetaminophen **OR** acetaminophen, HYDROmorphone (DILAUDID) injection, ondansetron **OR** ondansetron (ZOFRAN) IV    Assessment/Plan: 24 y.o. female with abnormal distal small bowel in setting of recent portal vein thrombus  She did not drink all the prep last night or this morning.  I reiterated that she really needs to drink all of it to clean out her colon for the colonoscopy we recommended.  Will reorder another split dose prep to start tonight and complete in AM.  She is currently scheduled for MAC colonoscopy at 11:30 tomorrow. Will not be stopping her xarelto given for the procedure since she has had several recent blood clots.  Unclear what is causing such significant pains.    Milus Banister, MD  11/15/2014, 1:15 PM [facility] Pager [phone]

## 2014-11-16 NOTE — Progress Notes (Signed)
Patient in endo procedure. Vital stable, labs stable.

## 2014-11-16 NOTE — Interval H&P Note (Signed)
History and Physical Interval Note:  11/16/2014 12:01 PM  Victoria Alvarez  has presented today for surgery, with the diagnosis of Ileitis, right lower quadrant pain  The various methods of treatment have been discussed with the patient and family. After consideration of risks, benefits and other options for treatment, the patient has consented to  Procedure(s): COLONOSCOPY (N/A) as a surgical intervention .  The patient's history has been reviewed, patient examined, no change in status, stable for surgery.  I have reviewed the patient's chart and labs.  Questions were answered to the patient's satisfaction.     Milus Banister

## 2014-11-16 NOTE — Progress Notes (Signed)
 PROGRESS NOTE  Female Victoria Alvarez MRN:[id number] DOB: 09/17/1990 DOA: 11/14/2014 PCP: FUNCHES, JOSALYN C, MD  HPI/Recap of past 24 hours:  Colonoscopy scheduled tomorrow due to inadequate prep   Assessment/Plan: Principal Problem:   Abdominal pain Active Problems:   Tobacco abuse   Seizure   Methadone maintenance therapy patient   Portal vein thrombosis   Cerebral venous sinus thrombosis   Enteritis   Lactic acidosis  Abdominal Pain secondary to Enteritis Abnormal CT scan. esr/crp wnl. on Clears, Flagyl (allergy to Cipro).  Continue on Eliquis Hydromorphone 1 mg q 4 hours PRN severe pain. [facility] consulted.  colonoscopy cancelled today due to inadequate prep, attempt colonoscope tomorrow on 9/8.  Lactic Acidosis Likely due to acute process currently going on in her bowel. Resolved with 2L IVF.  Left Portal Vein Thrombosis. Diagnosed 10/11/14. Currently on Eliquis.  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 Right sided weakness has resolved, no residual neurologic deficit per patient.  Seizure No current activity. Continue Keppra  Chronic Pain / Hx of interstitial cystitis Continue 85 mg methadone daily.  Tobacco Abuse Nicotine Patch  History of depression Appears stable, denies any suicidal/homicidal ideation. Continue Prozac. Check tsh.  Questionable hypercoagulable state. Hx of miscarriage, stroke, multiple thrombosis.  On Eliquis. Was scheduled to see Hematology in August. Unfortunately she was in the hospital and missed her appt. Recommend hematology follow up.  High normal tsh, free t4 0.97.  Mild leukopenia: unclear significance. resolved.  Condition: Guarded.   Code Status: full  Family Communication: patient   Disposition Plan: home when medically stable   Consultants:   GI  Procedures:  colonoscopy 9/8  Antibiotics:  Flagyl, allergic to cipro.    Objective: BP 114/61 mmHg  Pulse 59  Temp(Src) 97.9 F (36.6  C) (Oral)  Resp 20  Ht 5' 3" (1.6 m)  Wt 201 lb (91.173 kg)  BMI 35.61 kg/m2  SpO2 99%  LMP   Intake/Output Summary (Last 24 hours) at 11/16/14 0959 Last data filed at 11/16/14 0819  Gross per 24 hour  Intake   2201 ml  Output      0 ml  Net   2201 ml   Filed Weights   11/14/14 0715 11/14/14 1432  Weight: 200 lb 9.9 oz (91 kg) 201 lb (91.173 kg)    Exam:   General:  NAD  Cardiovascular: RRR  Respiratory: CTABL  Abdomen: Soft/ND/NT, positive BS  Musculoskeletal: No Edema  Neuro: aaox3, no focal deficit, reported prior neurologic deficit (right sided weakness) has resolved.  Data Reviewed: Basic Metabolic Panel:  Recent Labs Lab 11/14/14 0726 11/15/14 0452 11/16/14 0440  NA 134* 138 140  K 3.9 3.9 4.1  CL 101 110 109  CO2 23 24 26  GLUCOSE 119* 92 86  BUN 13 <5* <5*  CREATININE 0.83 0.78 0.86  CALCIUM 9.0 8.1* 8.2*  MG  --   --  1.8   Liver Function Tests:  Recent Labs Lab 11/14/14 0726  AST 25  ALT 19  ALKPHOS 51  BILITOT 0.8  PROT 5.7*  ALBUMIN 3.5    Recent Labs Lab 11/14/14 0726  LIPASE 30   No results for input(s): AMMONIA in the last 168 hours. CBC:  Recent Labs Lab 11/14/14 0726 11/15/14 0452 11/16/14 0440  WBC 11.6* 2.3* 3.4*  HGB 13.4 10.4* 10.8*  HCT 42.4 34.1* 34.5*  MCV 72.7* 74.1* 73.7*  PLT 210 166 173   Cardiac Enzymes:   No results for input(s): CKTOTAL, CKMB, CKMBINDEX, TROPONINI   in the last 168 hours. BNP (last 3 results) No results for input(s): BNP in the last 8760 hours.  ProBNP (last 3 results) No results for input(s): PROBNP in the last 8760 hours.  CBG: No results for input(s): GLUCAP in the last 168 hours.  No results found for this or any previous visit (from the past 240 hour(s)).   Studies: Dg Abd 1 View  11/15/2014   CLINICAL DATA:  Lower abdominal pain  EXAM: ABDOMEN - 1 VIEW  COMPARISON:  Abdominal radiographs October 14, 2014; CT abdomen and pelvis November 14, 2014  FINDINGS: There is  moderate stool in the colon. There is no bowel dilatation or air-fluid level suggesting obstruction. No free air is seen on this supine examination. There are surgical clips in the left pelvic region.  IMPRESSION: Bowel gas pattern overall unremarkable.   Electronically Signed   By: Lowella Grip III M.D.   On: 11/15/2014 12:45    Scheduled Meds: . apixaban  5 mg Oral BID  . FLUoxetine  20 mg Oral Daily  . Influenza vac split quadrivalent PF  0.5 mL Intramuscular Tomorrow-1000  . levETIRAcetam  500 mg Oral BID  . Linaclotide  145 mcg Oral Daily  . methadone  85 mg Oral Daily  . metronidazole  500 mg Intravenous Q8H  . nicotine  21 mg Transdermal Daily    Continuous Infusions: . 0.9 % NaCl with KCl 20 mEq / L 75 mL/hr at 11/15/14 2133     Time spent: 30mns  Deriona Altemose MD, PhD  [facility] Pager [phone] If 7PM-7AM, please contact night-coverage at [email or web address], password [email or web address]9/09/2014, 9:59 AM  LOS: 2 days

## 2014-11-16 NOTE — Transfer of Care (Signed)
Immediate Anesthesia Transfer of Care Note  Patient: Victoria Alvarez  Procedure(s) Performed: Procedure(s): COLONOSCOPY (N/A)  Patient Location: Endoscopy Unit  Anesthesia Type:MAC  Level of Consciousness: awake, alert  and oriented  Airway & Oxygen Therapy: Patient Spontanous Breathing and Patient connected to nasal cannula oxygen  Post-op Assessment: Report given to RN and Post -op Vital signs reviewed and stable  Post vital signs: Reviewed and stable  Last Vitals:  Filed Vitals:   11/16/14 1259  BP: 94/46  Pulse: 56  Temp: 36.5 C  Resp: 13    Complications: No apparent anesthesia complications

## 2014-11-16 NOTE — Progress Notes (Addendum)
Scope in large amount of solid stool present. MD aborted procedure.  Scope out pt to recovery.  Per MD to have additional prep and reschedule for tomorrow

## 2014-11-17 LAB — BASIC METABOLIC PANEL WITH GFR
Anion gap: 6 (ref 5–15)
BUN: <5 mg/dL — ABNORMAL LOW (ref 6–20)
CO2: 23 mmol/L (ref 22–32)
Calcium: 8.2 mg/dL — ABNORMAL LOW (ref 8.9–10.3)
Chloride: 111 mmol/L (ref 101–111)
Creatinine, Ser: 0.84 mg/dL (ref 0.44–1.00)
GFR calc Af Amer: >60 mL/min
GFR calc non Af Amer: >60 mL/min
Glucose, Bld: 99 mg/dL (ref 65–99)
Potassium: 3.9 mmol/L (ref 3.5–5.1)
Sodium: 140 mmol/L (ref 135–145)

## 2014-11-17 LAB — MAGNESIUM: Magnesium: 1.6 mg/dL — ABNORMAL LOW (ref 1.7–2.4)

## 2014-11-17 LAB — LACTIC ACID, PLASMA: Lactic Acid, Venous: 1.2 mmol/L (ref 0.5–2.0)

## 2014-11-17 LAB — T4, FREE: Free T4: 0.97 ng/dL (ref 0.61–1.12)

## 2014-11-17 MED ORDER — DICYCLOMINE HCL 10 MG PO CAPS
10.0000 mg | ORAL_CAPSULE | Freq: Three times a day (TID) | ORAL | Status: DC | PRN
  Administered 2014-11-17 – 2014-11-21 (×3): 10 mg via ORAL
  Filled 2014-11-17 (×3): qty 1

## 2014-11-17 MED ORDER — PEG-KCL-NACL-NASULF-NA ASC-C 100 G PO SOLR
1.0000 | Freq: Once | ORAL | Status: AC
  Administered 2014-11-18: 200 g via ORAL
  Filled 2014-11-17 (×2): qty 1

## 2014-11-17 MED ORDER — HYDROMORPHONE HCL 1 MG/ML IJ SOLN
1.0000 mg | Freq: Once | INTRAMUSCULAR | Status: AC
  Administered 2014-11-17: 1 mg via INTRAVENOUS

## 2014-11-17 NOTE — Discharge Instructions (Signed)

## 2014-11-17 NOTE — Progress Notes (Signed)
PROGRESS NOTE  Victoria Alvarez MAU:[id number] DOB: [birth year] DOA: 11/14/2014 PCP: Minerva Ends, MD  HPI/Recap of past 24 hours:  Colonoscopy rescheduled again for tomorrow due to inadequate prep and anesthesia needs.   Assessment/Plan: Principal Problem:   Abdominal pain Active Problems:   Tobacco abuse   Seizure   Methadone maintenance therapy patient   Portal vein thrombosis   Cerebral venous sinus thrombosis   Enteritis   Lactic acidosis   Generalized abdominal pain  Abdominal Pain secondary to Enteritis Abnormal CT scan. esr/crp wnl. on Clears, Flagyl (allergy to Cipro).  Continue on Eliquis Hydromorphone 1 mg q 4 hours PRN severe pain. [facility] consulted.   attempt colonoscope tomorrow on 9/9.  Lactic Acidosis Likely due to acute process currently going on in her bowel. Resolved with 2L IVF.  Left Portal Vein Thrombosis. Diagnosed 10/11/14. Currently on Eliquis.  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 Right sided weakness has resolved, no residual neurologic deficit per patient.  Seizure No current activity. Continue Keppra  Chronic Pain / Hx of interstitial cystitis Continue 85 mg methadone daily.  Tobacco Abuse Nicotine Patch  History of depression Appears stable, denies any suicidal/homicidal ideation. Continue Prozac. Check tsh.  Questionable hypercoagulable state. Hx of miscarriage, stroke, multiple thrombosis.  On Eliquis. Was scheduled to see Hematology in August. Unfortunately she was in the hospital and missed her appt. Recommend hematology follow up.  High normal tsh, free t4 0.97.  Mild leukopenia: unclear significance. resolved.  Condition: Guarded.   Code Status: full  Family Communication: patient   Disposition Plan: home when medically stable   Consultants:  [facility]  Procedures:  colonoscopy 9/9  Antibiotics:  Flagyl, allergic to cipro.    Objective: BP 102/48 mmHg  Pulse 60  Temp(Src)  97.8 F (36.6 C) (Oral)  Resp 18  Ht 5' 3"  (1.6 m)  Wt 201 lb (91.173 kg)  BMI 35.61 kg/m2  SpO2 99%  LMP   Intake/Output Summary (Last 24 hours) at 11/17/14 1625 Last data filed at 11/17/14 0300  Gross per 24 hour  Intake    700 ml  Output      0 ml  Net    700 ml   Filed Weights   11/14/14 0715 11/14/14 1432  Weight: 200 lb 9.9 oz (91 kg) 201 lb (91.173 kg)    Exam:   General:  NAD  Cardiovascular: RRR  Respiratory: CTABL  Abdomen: Soft/ND/NT, positive BS  Musculoskeletal: No Edema  Neuro: aaox3, no focal deficit, reported prior neurologic deficit (right sided weakness) has resolved.  Data Reviewed: Basic Metabolic Panel:  Recent Labs Lab 11/14/14 0726 11/15/14 0452 11/16/14 0440 11/17/14 1010  NA 134* 138 140 140  K 3.9 3.9 4.1 3.9  CL 101 110 109 111  CO2 23 24 26 23   GLUCOSE 119* 92 86 99  BUN 13 <5* <5* <5*  CREATININE 0.83 0.78 0.86 0.84  CALCIUM 9.0 8.1* 8.2* 8.2*  MG  --   --  1.8 1.6*   Liver Function Tests:  Recent Labs Lab 11/14/14 0726  AST 25  ALT 19  ALKPHOS 51  BILITOT 0.8  PROT 5.7*  ALBUMIN 3.5    Recent Labs Lab 11/14/14 0726  LIPASE 30   No results for input(s): AMMONIA in the last 168 hours. CBC:  Recent Labs Lab 11/14/14 0726 11/15/14 0452 11/16/14 0440 11/16/14 1607  WBC 11.6* 2.3* 3.4* 4.3  NEUTROABS  --   --   --  1.9  HGB  13.4 10.4* 10.8* 12.1  HCT 42.4 34.1* 34.5* 38.3  MCV 72.7* 74.1* 73.7* 73.7*  PLT 210 166 173 195   Cardiac Enzymes:   No results for input(s): CKTOTAL, CKMB, CKMBINDEX, TROPONINI in the last 168 hours. BNP (last 3 results) No results for input(s): BNP in the last 8760 hours.  ProBNP (last 3 results) No results for input(s): PROBNP in the last 8760 hours.  CBG: No results for input(s): GLUCAP in the last 168 hours.  No results found for this or any previous visit (from the past 240 hour(s)).   Studies: No results found.  Scheduled Meds: . apixaban  5 mg Oral BID  .  FLUoxetine  20 mg Oral Daily  . Influenza vac split quadrivalent PF  0.5 mL Intramuscular Tomorrow-1000  . levETIRAcetam  500 mg Oral BID  . Linaclotide  145 mcg Oral Daily  . methadone  85 mg Oral Daily  . metronidazole  500 mg Intravenous Q8H  . nicotine  21 mg Transdermal Daily  . [START ON 11/18/2014] peg 3350 powder  1 kit Oral Once    Continuous Infusions: . 0.9 % NaCl with KCl 20 mEq / L 75 mL/hr at 11/17/14 0902     Time spent: 8mns  Victoria Schweiger MD, PhD  [facility] Pager [phone] If 7PM-7AM, please contact night-coverage at [email or web address], password [email or web address] [place]9/10/2014, 4:25 PM  LOS: 3 days

## 2014-11-17 NOTE — Progress Notes (Addendum)
[facility] Progress Note    Since last GI note: Colonoscopy yesterday aborted due to very poor prep.  Overnight she repeated prep, has had 2 smog enemas. Apparently clear fluid output now.    Objective: Vital signs in last 24 hours: Temp:  [97.7 F (36.5 C)-98.6 F (37 C)] 97.8 F (36.6 C) (09/08 0658) Pulse Rate:  [52-77] 60 (09/08 0658) Resp:  [13-26] 18 (09/08 0658) BP: (84-135)/(46-75) 102/48 mmHg (09/08 0658) SpO2:  [97 %-100 %] 99 % (09/08 0658) Last BM Date: 11/16/14 General: alert and oriented times 3 Heart: regular rate and rythm Abdomen: soft, non-tender, non-distended, normal bowel sounds   Lab Results:  Recent Labs  11/15/14 0452 11/16/14 0440 11/16/14 1607  WBC 2.3* 3.4* 4.3  HGB 10.4* 10.8* 12.1  PLT 166 173 195  MCV 74.1* 73.7* 73.7*    Recent Labs  11/15/14 0452 11/16/14 0440  NA 138 140  K 3.9 4.1  CL 110 109  CO2 24 26  GLUCOSE 92 86  BUN <5* <5*  CREATININE 0.78 0.86  CALCIUM 8.1* 8.2*   No results for input(s): PROT, ALBUMIN, AST, ALT, ALKPHOS, BILITOT, BILIDIR, IBILI in the last 72 hours. No results for input(s): INR in the last 72 hours.   Studies/Results: Dg Abd 1 View  11/15/2014   CLINICAL DATA:  Lower abdominal pain  EXAM: ABDOMEN - 1 VIEW  COMPARISON:  Abdominal radiographs October 14, 2014; CT abdomen and pelvis November 14, 2014  FINDINGS: There is moderate stool in the colon. There is no bowel dilatation or air-fluid level suggesting obstruction. No free air is seen on this supine examination. There are surgical clips in the left pelvic region.  IMPRESSION: Bowel gas pattern overall unremarkable.   Electronically Signed   By: Lowella Grip III M.D.   On: 11/15/2014 12:45     Medications: Scheduled Meds: . apixaban  5 mg Oral BID  . FLUoxetine  20 mg Oral Daily  . Influenza vac split quadrivalent PF  0.5 mL Intramuscular Tomorrow-1000  . levETIRAcetam  500 mg Oral BID  . Linaclotide  145 mcg Oral Daily  .  methadone  85 mg Oral Daily  . metronidazole  500 mg Intravenous Q8H  . nicotine  21 mg Transdermal Daily   Continuous Infusions: . 0.9 % NaCl with KCl 20 mEq / L 75 mL/hr at 11/17/14 0902   PRN Meds:.acetaminophen **OR** acetaminophen, HYDROmorphone (DILAUDID) injection, ondansetron **OR** ondansetron (ZOFRAN) IV    Assessment/Plan: 24 y.o. female with abd pain, abnormal small bowel on CT  We are planning for colonoscopy today, need anesthesia assistance and await word on whether they will be able to help.  If not then will plan for colonoscopy tomorrow and she can resume clear liquids overnight.  Should know more in next hour or so.    Milus Banister, MD  11/17/2014, 11:03 AM [facility] Pager [phone]    Addendum: Have decided to proceed with colonoscopy tomorrow, 8-8:30 with anesthesia's assistance.  Will give one more 'dose' of moviprep in early AM to best ensure adequate prep after 2 failed preps.

## 2014-11-18 ENCOUNTER — Inpatient Hospital Stay (HOSPITAL_COMMUNITY): Payer: Medicaid Other | Admitting: Anesthesiology

## 2014-11-18 ENCOUNTER — Encounter (HOSPITAL_COMMUNITY): Payer: Self-pay

## 2014-11-18 ENCOUNTER — Encounter (HOSPITAL_COMMUNITY)
Admission: EM | Disposition: A | Discharge status: Home / Self Care | Payer: Self-pay | Source: Home / Self Care | Attending: Family Medicine

## 2014-11-18 HISTORY — PX: COLONOSCOPY WITH PROPOFOL: SHX5780

## 2014-11-18 LAB — CBC
HCT: 34.3 % — ABNORMAL LOW (ref 36.0–46.0)
Hemoglobin: 10.6 g/dL — ABNORMAL LOW (ref 12.0–15.0)
MCH: 22.6 pg — ABNORMAL LOW (ref 26.0–34.0)
MCHC: 30.9 g/dL (ref 30.0–36.0)
MCV: 73.1 fL — ABNORMAL LOW (ref 78.0–100.0)
Platelets: 176 10*3/uL (ref 150–400)
RBC: 4.69 MIL/uL (ref 3.87–5.11)
RDW: 26.4 % — ABNORMAL HIGH (ref 11.5–15.5)
WBC: 3.8 10*3/uL — ABNORMAL LOW (ref 4.0–10.5)

## 2014-11-18 LAB — BASIC METABOLIC PANEL
Anion gap: 7 (ref 5–15)
BUN: <5 mg/dL — ABNORMAL LOW (ref 6–20)
CO2: 24 mmol/L (ref 22–32)
Calcium: 8.2 mg/dL — ABNORMAL LOW (ref 8.9–10.3)
Chloride: 107 mmol/L (ref 101–111)
Creatinine, Ser: 0.86 mg/dL (ref 0.44–1.00)
GFR calc Af Amer: >60 mL/min (ref >60)
GFR calc non Af Amer: >60 mL/min (ref >60)
Glucose, Bld: 95 mg/dL (ref 65–99)
Potassium: 3.8 mmol/L (ref 3.5–5.1)
Sodium: 138 mmol/L (ref 135–145)

## 2014-11-18 LAB — MAGNESIUM: Magnesium: 1.9 mg/dL (ref 1.7–2.4)

## 2014-11-18 LAB — LACTIC ACID, PLASMA: Lactic Acid, Venous: 0.8 mmol/L (ref 0.5–2.0)

## 2014-11-18 SURGERY — COLONOSCOPY WITH PROPOFOL
Anesthesia: Monitor Anesthesia Care

## 2014-11-18 MED ORDER — LACTATED RINGERS IV SOLN: INTRAVENOUS | Status: DC

## 2014-11-18 MED ORDER — PROPOFOL 10 MG/ML IV BOLUS
INTRAVENOUS | Status: DC | PRN
  Administered 2014-11-18: 50 mg via INTRAVENOUS

## 2014-11-18 MED ORDER — METRONIDAZOLE 500 MG PO TABS
500.0000 mg | ORAL_TABLET | Freq: Three times a day (TID) | ORAL | Status: DC
  Administered 2014-11-18 – 2014-11-22 (×13): 500 mg via ORAL
  Filled 2014-11-18 (×13): qty 1

## 2014-11-18 MED ORDER — FENTANYL CITRATE (PF) 100 MCG/2ML IJ SOLN
INTRAMUSCULAR | Status: DC | PRN
  Administered 2014-11-18: 25 ug via INTRAVENOUS
  Administered 2014-11-18 (×2): 50 ug via INTRAVENOUS
  Administered 2014-11-18: 25 ug via INTRAVENOUS

## 2014-11-18 MED ORDER — SODIUM CHLORIDE 0.9 % IV SOLN: INTRAVENOUS | Status: DC

## 2014-11-18 MED ORDER — PROPOFOL INFUSION 10 MG/ML OPTIME
INTRAVENOUS | Status: DC | PRN
  Administered 2014-11-18: 200 ug/kg/min via INTRAVENOUS

## 2014-11-18 MED ORDER — PREDNISONE 20 MG PO TABS
20.0000 mg | ORAL_TABLET | Freq: Two times a day (BID) | ORAL | Status: DC
  Administered 2014-11-18 – 2014-11-22 (×9): 20 mg via ORAL
  Filled 2014-11-18 (×9): qty 1

## 2014-11-18 MED ORDER — LACTATED RINGERS IV SOLN
INTRAVENOUS | Status: DC | PRN
  Administered 2014-11-18: 08:00:00 via INTRAVENOUS

## 2014-11-18 NOTE — H&P (View-Only) (Signed)
[facility] Progress Note    Since last GI note: Colonoscopy yesterday aborted due to very poor prep.  Overnight she repeated prep, has had 2 smog enemas. Apparently clear fluid output now.    Objective: Vital signs in last 24 hours: Temp:  [97.7 F (36.5 C)-98.6 F (37 C)] 97.8 F (36.6 C) (09/08 0658) Pulse Rate:  [52-77] 60 (09/08 0658) Resp:  [13-26] 18 (09/08 0658) BP: (84-135)/(46-75) 102/48 mmHg (09/08 0658) SpO2:  [97 %-100 %] 99 % (09/08 0658) Last BM Date: 11/16/14 General: alert and oriented times 3 Heart: regular rate and rythm Abdomen: soft, non-tender, non-distended, normal bowel sounds   Lab Results:  Recent Labs  11/15/14 0452 11/16/14 0440 11/16/14 1607  WBC 2.3* 3.4* 4.3  HGB 10.4* 10.8* 12.1  PLT 166 173 195  MCV 74.1* 73.7* 73.7*    Recent Labs  11/15/14 0452 11/16/14 0440  NA 138 140  K 3.9 4.1  CL 110 109  CO2 24 26  GLUCOSE 92 86  BUN <5* <5*  CREATININE 0.78 0.86  CALCIUM 8.1* 8.2*   No results for input(s): PROT, ALBUMIN, AST, ALT, ALKPHOS, BILITOT, BILIDIR, IBILI in the last 72 hours. No results for input(s): INR in the last 72 hours.   Studies/Results: Dg Abd 1 View  11/15/2014   CLINICAL DATA:  Lower abdominal pain  EXAM: ABDOMEN - 1 VIEW  COMPARISON:  Abdominal radiographs October 14, 2014; CT abdomen and pelvis November 14, 2014  FINDINGS: There is moderate stool in the colon. There is no bowel dilatation or air-fluid level suggesting obstruction. No free air is seen on this supine examination. There are surgical clips in the left pelvic region.  IMPRESSION: Bowel gas pattern overall unremarkable.   Electronically Signed   By: Lowella Grip III M.D.   On: 11/15/2014 12:45     Medications: Scheduled Meds: . apixaban  5 mg Oral BID  . FLUoxetine  20 mg Oral Daily  . Influenza vac split quadrivalent PF  0.5 mL Intramuscular Tomorrow-1000  . levETIRAcetam  500 mg Oral BID  . Linaclotide  145 mcg Oral Daily  .  methadone  85 mg Oral Daily  . metronidazole  500 mg Intravenous Q8H  . nicotine  21 mg Transdermal Daily   Continuous Infusions: . 0.9 % NaCl with KCl 20 mEq / L 75 mL/hr at 11/17/14 0902   PRN Meds:.acetaminophen **OR** acetaminophen, HYDROmorphone (DILAUDID) injection, ondansetron **OR** ondansetron (ZOFRAN) IV    Assessment/Plan: 24 y.o. female with abd pain, abnormal small bowel on CT  We are planning for colonoscopy today, need anesthesia assistance and await word on whether they will be able to help.  If not then will plan for colonoscopy tomorrow and she can resume clear liquids overnight.  Should know more in next hour or so.    Milus Banister, MD  11/17/2014, 11:03 AM [facility] Pager [phone]    Addendum: Have decided to proceed with colonoscopy tomorrow, 8-8:30 with anesthesia's assistance.  Will give one more 'dose' of moviprep in early AM to best ensure adequate prep after 2 failed preps.

## 2014-11-18 NOTE — Interval H&P Note (Signed)
History and Physical Interval Note:  11/18/2014 8:26 AM  Victoria Alvarez  has presented today for surgery, with the diagnosis of abnormal CT scan of small bowel, abd pain  The various methods of treatment have been discussed with the patient and family. After consideration of risks, benefits and other options for treatment, the patient has consented to  Procedure(s): COLONOSCOPY WITH PROPOFOL (N/A) as a surgical intervention .  The patient's history has been reviewed, patient examined, no change in status, stable for surgery.  I have reviewed the patient's chart and labs.  Questions were answered to the patient's satisfaction.     Milus Banister

## 2014-11-18 NOTE — Transfer of Care (Signed)
Immediate Anesthesia Transfer of Care Note  Patient: Victoria Alvarez  Procedure(s) Performed: Procedure(s): COLONOSCOPY WITH PROPOFOL (N/A)  Patient Location: PACU  Anesthesia Type:MAC  Level of Consciousness: awake, oriented and patient cooperative  Airway & Oxygen Therapy: Patient Spontanous Breathing and Patient connected to face mask oxygen  Post-op Assessment: Report given to RN, Post -op Vital signs reviewed and stable and Patient moving all extremities  Post vital signs: Reviewed and stable  Last Vitals:  Filed Vitals:   11/18/14 0734  BP: 121/59  Pulse: 63  Temp: 36.8 C  Resp: 20    Complications: No apparent anesthesia complications

## 2014-11-18 NOTE — Anesthesia Procedure Notes (Signed)
Procedure Name: MAC Date/Time: 11/18/2014 8:45 AM Performed by: Coralee Rud Oxygen Delivery Method: Simple face mask Preoxygenation: Pre-oxygenation with 100% oxygen

## 2014-11-18 NOTE — Progress Notes (Signed)
PROGRESS NOTE  Victoria Alvarez RWE:[id number] DOB: May 19, 1990 DOA: 11/14/2014 PCP: Minerva Ends, MD  HPI/Recap of past 24 hours:  Returned from Colonoscopy , reported ab pain slightly better   Assessment/Plan: Principal Problem:   Abdominal pain Active Problems:   Tobacco abuse   Seizure   Methadone maintenance therapy patient   Portal vein thrombosis   Cerebral venous sinus thrombosis   Enteritis   Lactic acidosis   Generalized abdominal pain  Abdominal Pain secondary to Enteritis Abnormal CT scan. esr/crp wnl. on Clears, Flagyl (allergy to Cipro).  Continue on Eliquis Hydromorphone 1 mg q 4 hours PRN severe pain. [facility] consulted.   multiple attempt for colonoscope due to poor prep, she finally had it done on 9/9. Colonoscopy for possible atypical presentation for IBD (Crohn's disease) vs ischemic stricture. Biopsy pending, GI started steroid trial. TB/hepatitis test for possible needing biological agent. She is continued on flagyl since admission, GI please advice on the need of abx.   Lactic Acidosis Likely due to acute process currently going on in her bowel. Resolved with 2L IVF.  Left Portal Vein Thrombosis. Diagnosed 10/11/14. Currently on Eliquis.  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 Right sided weakness has resolved, no residual neurologic deficit per patient.  Seizure No current activity. Continue Keppra  Chronic Pain / Hx of interstitial cystitis Continue 85 mg methadone daily.  Tobacco Abuse Nicotine Patch  History of depression Appears stable, denies any suicidal/homicidal ideation. Continue Prozac. Check tsh.  Questionable hypercoagulable state. Hx of miscarriage, stroke, multiple thrombosis.  On Eliquis. Was scheduled to see Hematology in August. Unfortunately she was in the hospital and missed her appt. Recommend hematology follow up.  High normal tsh, free t4 0.97.  Mild leukopenia: unclear significance.  resolved.  Condition: Guarded.   Code Status: full  Family Communication: patient   Disposition Plan: home when cleared by GI   Consultants:  [facility]  Procedures:  colonoscopy 9/9  Antibiotics:  Flagyl, allergic to cipro.    Objective: BP 109/51 mmHg  Pulse 81  Temp(Src) 99.2 F (37.3 C) (Oral)  Resp 12  Ht _0  (1.6 m)  Wt 201 lb (91.173 kg)  BMI 35.61 kg/m2  SpO2 98%  LMP   Intake/Output Summary (Last 24 hours) at 11/18/14 1745 Last data filed at 11/18/14 1400  Gross per 24 hour  Intake   3807 ml  Output     31 ml  Net   3776 ml   Filed Weights   11/14/14 0715 11/14/14 1432  Weight: 200 lb 9.9 oz (91 kg) 201 lb (91.173 kg)    Exam:   General:  NAD  Cardiovascular: RRR  Respiratory: CTABL  Abdomen: diffuse tender, Soft/ND, positive BS  Musculoskeletal: No Edema  Neuro: aaox3, no focal deficit, reported prior neurologic deficit (right sided weakness) has resolved.  Data Reviewed: Basic Metabolic Panel:  Recent Labs Lab 11/14/14 0726 11/15/14 0452 11/16/14 0440 11/17/14 1010 11/18/14 0621  NA 134* 138 140 140 138  K 3.9 3.9 4.1 3.9 3.8  CL 101 110 109 111 107  CO2 _1 GLUCOSE 119* 92 86 99 95  BUN 13 <5* <5* <5* <5*  CREATININE 0.83 0.78 0.86 0.84 0.86  CALCIUM 9.0 8.1* 8.2* 8.2* 8.2*  MG  --   --  1.8 1.6* 1.9   Liver Function Tests:  Recent Labs Lab 11/14/14 0726  AST 25  ALT 19  ALKPHOS 51  BILITOT 0.8  PROT 5.7*  ALBUMIN 3.5    Recent Labs Lab 11/14/14 0726  LIPASE 30   No results for input(s): AMMONIA in the last 168 hours. CBC:  Recent Labs Lab 11/14/14 0726 11/15/14 0452 11/16/14 0440 11/16/14 1607 11/18/14 0621  WBC 11.6* 2.3* 3.4* 4.3 3.8*  NEUTROABS  --   --   --  1.9  --   HGB 13.4 10.4* 10.8* 12.1 10.6*  HCT 42.4 34.1* 34.5* 38.3 34.3*  MCV 72.7* 74.1* 73.7* 73.7* 73.1*  PLT 210 166 173 195 176   Cardiac Enzymes:   No results for input(s): CKTOTAL, CKMB, CKMBINDEX,  TROPONINI in the last 168 hours. BNP (last 3 results) No results for input(s): BNP in the last 8760 hours.  ProBNP (last 3 results) No results for input(s): PROBNP in the last 8760 hours.  CBG: No results for input(s): GLUCAP in the last 168 hours.  No results found for this or any previous visit (from the past 240 hour(s)).   Studies: No results found.  Scheduled Meds: . apixaban  5 mg Oral BID  . FLUoxetine  20 mg Oral Daily  . Influenza vac split quadrivalent PF  0.5 mL Intramuscular Tomorrow-1000  . levETIRAcetam  500 mg Oral BID  . Linaclotide  145 mcg Oral Daily  . methadone  85 mg Oral Daily  . metroNIDAZOLE  500 mg Oral 3 times per day  . nicotine  21 mg Transdermal Daily  . predniSONE  20 mg Oral BID WC    Continuous Infusions:     Time spent: 75mns  Danis Pembleton MD, PhD  [facility] Pager [phone] If 7PM-7AM, please contact night-coverage at [email or web address], password [email or web address]9/11/2014, 5:45 PM  LOS: 4 days

## 2014-11-18 NOTE — Op Note (Signed)
[facility] [place], [id number]   COLONOSCOPY PROCEDURE REPORT  PATIENT: Victoria Alvarez, Victoria Alvarez  MR#: [id number] BIRTHDATE: [birth year] , 24  yrs. old GENDER: female ENDOSCOPIST: Milus Banister, MD PROCEDURE DATE:  11/18/2014 PROCEDURE:   Colonoscopy with biopsy First Screening Colonoscopy - Avg.  risk and is 50 yrs.  old or older - No.  Prior Negative Screening - Now for repeat screening. N/A  History of Adenoma - Now for follow-up colonoscopy & has been > or = to 3 yrs.  N/A ASA CLASS:   Class II INDICATIONS:abdominal pain, distal ileitis; had stroke due to blood clot several months ago, had PV thrombus 1-2 months ago. MEDICATIONS: Monitored anesthesia care  DESCRIPTION OF PROCEDURE:   After the risks benefits and alternatives of the procedure were thoroughly explained, informed consent was obtained.  The digital rectal exam revealed no abnormalities of the rectum.   The Pentax Adult Colon [id number] endoscope was introduced through the anus and advanced to the terminal ileum which was intubated for a short distance. No adverse events experienced.   The quality of the prep was good.  The instrument was then slowly withdrawn as the colon was fully examined. Estimated blood loss is zero unless otherwise noted in this procedure report.  COLON FINDINGS: The colon mucosa was normal throughout.  The most terminal ileum was normal.  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  The most distal aspect was biopsied extensively.  The examination was otherwise normal.  Retroflexed views revealed no abnormalities. The time to cecum = 3 min Withdrawal time = 10 min   The scope was withdrawn and the procedure completed. COMPLICATIONS: There were no immediate complications.  ENDOSCOPIC IMPRESSION: The colon mucosa was normal throughout.  The most terminal  ileum was normal.  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  The most distal aspect was biopsied extensively.  The examination was otherwise normal  RECOMMENDATIONS: Await final pathology.  This would be an atypical presentation for IBD (Crohn's disease) but perhaps that is indeed her problem.  She also had PV thrombosis recently and that brings up possibility of ischemic stricture.  Hopefully pathology will help make the diagnosis.   Planning to test for TB, hep B in case this does end up being Crohn's (to allow for biologics treatment).  I am also going to empirically start her on prednisone for now (71m twice daily).  eSigned:  DMilus Banister MD 11/18/2014 9:30 AM   cc: MLucio Edward MD

## 2014-11-18 NOTE — Anesthesia Postprocedure Evaluation (Signed)
  Anesthesia Post-op Note  Patient: Victoria Alvarez  Procedure(s) Performed: Procedure(s): COLONOSCOPY WITH PROPOFOL (N/A)  Patient Location: PACU  Anesthesia Type:MAC  Level of Consciousness: awake and alert   Airway and Oxygen Therapy: Patient Spontanous Breathing  Post-op Pain: none  Post-op Assessment: Post-op Vital signs reviewed              Post-op Vital Signs: Reviewed  Last Vitals:  Filed Vitals:   11/18/14 0940  BP: 91/65  Pulse: 64  Temp:   Resp: 12    Complications: No apparent anesthesia complications

## 2014-11-18 NOTE — Anesthesia Preprocedure Evaluation (Addendum)
Anesthesia Evaluation  Patient identified by MRN, date of birth, ID band Patient awake    Reviewed: Allergy & Precautions, NPO status , Patient's Chart, lab work & pertinent test results  History of Anesthesia Complications Negative for: history of anesthetic complications  Airway Mallampati: II  TM Distance: <3 FB Neck ROM: Full    Dental no notable dental hx. (+) Teeth Intact, Dental Advisory Given   Pulmonary Current Smoker,    Pulmonary exam normal breath sounds clear to auscultation       Cardiovascular hypertension, + Peripheral Vascular Disease  Normal cardiovascular exam Rhythm:Regular Rate:Normal     Neuro/Psych  Headaches, Seizures -, Well Controlled,  PSYCHIATRIC DISORDERS Anxiety Depression CVA, No Residual Symptoms    GI/Hepatic Bowel prep,(+)     substance abuse (opioid abuse, currently on methadone)  ,   Endo/Other  negative endocrine ROS  Renal/GU  Bladder dysfunction  Interstitial cystitis    Musculoskeletal negative musculoskeletal ROS (+)   Abdominal   Peds negative pediatric ROS (+)  Hematology  (+) anemia ,   Anesthesia Other Findings   Reproductive/Obstetrics negative OB ROS                            Anesthesia Physical  Anesthesia Plan  ASA: III  Anesthesia Plan: MAC   Post-op Pain Management:    Induction: Intravenous  Airway Management Planned: Nasal Cannula  Additional Equipment:   Intra-op Plan:   Post-operative Plan:   Informed Consent: I have reviewed the patients History and Physical, chart, labs and discussed the procedure including the risks, benefits and alternatives for the proposed anesthesia with the patient or authorized representative who has indicated his/her understanding and acceptance.   Dental advisory given  Plan Discussed with: CRNA and Anesthesiologist  Anesthesia Plan Comments:         Anesthesia Quick  Evaluation

## 2014-11-19 LAB — CBC WITH DIFFERENTIAL/PLATELET
Basophils Absolute: 0 K/uL (ref 0.0–0.1)
Basophils Relative: 0 % (ref 0–1)
Eosinophils Absolute: 0 K/uL (ref 0.0–0.7)
Eosinophils Relative: 0 % (ref 0–5)
HCT: 35.2 % — ABNORMAL LOW (ref 36.0–46.0)
Hemoglobin: 11.2 g/dL — ABNORMAL LOW (ref 12.0–15.0)
Lymphocytes Relative: 11 % — ABNORMAL LOW (ref 12–46)
Lymphs Abs: 0.6 K/uL — ABNORMAL LOW (ref 0.7–4.0)
MCH: 23.2 pg — ABNORMAL LOW (ref 26.0–34.0)
MCHC: 31.8 g/dL (ref 30.0–36.0)
MCV: 73 fL — ABNORMAL LOW (ref 78.0–100.0)
Monocytes Absolute: 0.2 K/uL (ref 0.1–1.0)
Monocytes Relative: 4 % (ref 3–12)
Neutro Abs: 4.5 K/uL (ref 1.7–7.7)
Neutrophils Relative %: 85 % — ABNORMAL HIGH (ref 43–77)
Platelets: 223 K/uL (ref 150–400)
RBC: 4.82 MIL/uL (ref 3.87–5.11)
RDW: 25.7 % — ABNORMAL HIGH (ref 11.5–15.5)
WBC: 5.3 K/uL (ref 4.0–10.5)

## 2014-11-19 LAB — COMPREHENSIVE METABOLIC PANEL
ALT: 19 U/L (ref 14–54)
AST: 19 U/L (ref 15–41)
Albumin: 2.9 g/dL — ABNORMAL LOW (ref 3.5–5.0)
Alkaline Phosphatase: 46 U/L (ref 38–126)
Anion gap: 7 (ref 5–15)
BUN: <5 mg/dL — ABNORMAL LOW (ref 6–20)
CO2: 24 mmol/L (ref 22–32)
Calcium: 9 mg/dL (ref 8.9–10.3)
Chloride: 107 mmol/L (ref 101–111)
Creatinine, Ser: 0.9 mg/dL (ref 0.44–1.00)
GFR calc Af Amer: >60 mL/min (ref >60)
GFR calc non Af Amer: >60 mL/min (ref >60)
Glucose, Bld: 148 mg/dL — ABNORMAL HIGH (ref 65–99)
Potassium: 4 mmol/L (ref 3.5–5.1)
Sodium: 138 mmol/L (ref 135–145)
Total Bilirubin: 0.7 mg/dL (ref 0.3–1.2)
Total Protein: 5 g/dL — ABNORMAL LOW (ref 6.5–8.1)

## 2014-11-19 LAB — MAGNESIUM: Magnesium: 1.6 mg/dL — ABNORMAL LOW (ref 1.7–2.4)

## 2014-11-19 LAB — HEPATITIS C ANTIBODY: HCV Ab: <0.1 {s_co_ratio} (ref 0.0–0.9)

## 2014-11-19 LAB — HEPATITIS B SURFACE ANTIBODY,QUALITATIVE: Hep B S Ab: NONREACTIVE

## 2014-11-19 MED ORDER — WHITE PETROLATUM GEL
Status: AC
  Administered 2014-11-19: 0.2
  Filled 2014-11-19: qty 1

## 2014-11-19 NOTE — Progress Notes (Signed)
[facility]                                                                              Patient Demographics  Victoria Alvarez, is a 24 y.o. female, DOB - 28-Aug-1990, RDE:[id number]  Admit date - 11/14/2014   Admitting Physician Kelvin Cellar, MD  Outpatient Primary MD for the patient is Minerva Ends, MD  LOS - 5   Chief Complaint  Patient presents with  . Abdominal Pain  . Emesis       Brief HPI   Per Dr Coralyn Pear   Victoria Alvarez is a 24 y.o. 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 Victoria Alvarez has been hospitalized 2x recently for similar symptoms. She was feeling better after discharge 8/27 but still having cramping pain that was relieved with bowel movements. Yesterday 9/4 she began having continuous abdominal pain not relieved with bowel movements. The pain is located thru out her abdomen. It stops her from eating. She had 1 episode of vomiting this am (no hematemesis). Her stools are soft, almost loose, and brown. She denies fevers, cough, sore throat, chest pain and difficulty breathing. In the ER her lactic acid is 2.88, wbc is 11.6, CT abdomen/pelvis shows: circumferential wall thickening in the distal ileum.   Assessment & Plan    Principal Problem: Abdominal Pain secondary to Enteritis - CT abdomen showed enteritis involving the distal ileum with adjacent inflammation and small amount of fluid, likely inflammatory, infectious or Crohn's disease. No abscess  -ESR/CRP wnl. - on Flagyl (allergy to Cipro).Patient started on prednisone by GI incase the ileal stricture represents IBD  - Continue pain control - GI consulted. Colonoscopy 9/9 showed tight but not complete distal ileum stricture that appears ulcerated, inflamed with distal and about 10-15 cm proximal to the IC valve, biopsies pending  - TB for possible needing biological  agent. Hepatitis negative.  Lactic Acidosis - Likely due to acute process currently going on in her bowel. Resolved with 2L IVF.  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 Right sided weakness has resolved, no residual neurologic deficit per patient.  Seizure - Stable, continue Keppra  Chronic Pain / Hx of interstitial cystitis Continue 85 mg methadone daily.  Tobacco Abuse Nicotine Patch  History of depression Appears stable, denies any suicidal/homicidal ideation. Continue Prozac.  TSH 4.3, free T4 0.9   Questionable hypercoagulable state; Hx of miscarriage, stroke, multiple thrombosis.  On Eliquis, was scheduled to see Hematology in August. Unfortunately she was in the hospital and missed her appt, hematology follow up.  Mild leukopenia: unclear significance, resolved.  Code Status: full code   Family Communication: Discussed in detail with the patient, all imaging results, lab results explained to the patient    Disposition Plan: not medically ready  Time Spent in minutes  25 minutes  Procedures  colonoscopy  Consults   GI   DVT Prophylaxis eliquis  Medications  Scheduled Meds: . apixaban  5 mg Oral BID  . FLUoxetine  20 mg Oral  Daily  . Influenza vac split quadrivalent PF  0.5 mL Intramuscular Tomorrow-1000  . levETIRAcetam  500 mg Oral BID  . Linaclotide  145 mcg Oral Daily  . methadone  85 mg Oral Daily  . metroNIDAZOLE  500 mg Oral 3 times per day  . nicotine  21 mg Transdermal Daily  . predniSONE  20 mg Oral BID WC   Continuous Infusions:  PRN Meds:.acetaminophen **OR** [DISCONTINUED] acetaminophen, dicyclomine, HYDROmorphone (DILAUDID) injection, ondansetron **OR** ondansetron (ZOFRAN) IV   Antibiotics   Anti-infectives    Start     Dose/Rate Route Frequency Ordered Stop   11/18/14 1400  metroNIDAZOLE (FLAGYL) tablet 500 mg     500 mg Oral 3 times per day 11/18/14 0939      11/14/14 1530  metroNIDAZOLE (FLAGYL) IVPB 500 mg  Status:  Discontinued     500 mg 100 mL/hr over 60 Minutes Intravenous Every 8 hours 11/14/14 1429 11/18/14 9476        Subjective:   Victoria Alvarez was seen and examined today.  Still c/o abdominal pain. Patient denies dizziness, chest pain, shortness of breath,  new weakness, numbess, tingling. No acute events overnight.    Objective:   Blood pressure 116/84, pulse 106, temperature 98.5 F (36.9 C), temperature source Oral, resp. rate 16, height 5' 3"  (1.6 m), weight 91.173 kg (201 lb), SpO2 98 %, not currently breastfeeding.  Wt Readings from Last 3 Encounters:  11/14/14 91.173 kg (201 lb)  11/06/14 91 kg (200 lb 9.9 oz)  10/07/14 77.111 kg (170 lb)     Intake/Output Summary (Last 24 hours) at 11/19/14 1427 Last data filed at 11/19/14 0800  Gross per 24 hour  Intake 2346.25 ml  Output      2 ml  Net 2344.25 ml    Exam  General: Alert and oriented x 3, NAD  HEENT:  PERRLA, EOMI, Anicteric Sclera, mucous membranes moist.   Neck: Supple, no JVD, no masses  CVS: S1 S2 auscultated, no rubs, murmurs or gallops. Regular rate and rhythm.  Respiratory: Clear to auscultation bilaterally, no wheezing, rales or rhonchi  Abdomen: Soft, diffuse tenderness, nondistended, + bowel sounds  Ext: no cyanosis clubbing or edema  Neuro: AAOx3, Cr N's II- XII. Strength 5/5 upper and lower extremities bilaterally  Skin: No rashes  Psych: Normal affect and demeanor, alert and oriented x3    Data Review   Micro Results No results found for this or any previous visit (from the past 240 hour(s)).  Radiology Reports Dg Abd 1 View  11/15/2014   CLINICAL DATA:  Lower abdominal pain  EXAM: ABDOMEN - 1 VIEW  COMPARISON:  Abdominal radiographs October 14, 2014; CT abdomen and pelvis November 14, 2014  FINDINGS: There is moderate stool in the colon. There is no bowel dilatation or air-fluid level suggesting obstruction. No free air is  seen on this supine examination. There are surgical clips in the left pelvic region.  IMPRESSION: Bowel gas pattern overall unremarkable.   Electronically Signed   By: Lowella Grip III M.D.   On: 11/15/2014 12:45   Ct Abdomen Pelvis W Contrast  11/14/2014   CLINICAL DATA:  24 year old female with acute abdominal and pelvic pain with vomiting for 1 day.  EXAM: CT ABDOMEN AND PELVIS WITH CONTRAST  TECHNIQUE: Multidetector CT imaging of the abdomen and pelvis was performed using the standard protocol following bolus administration of intravenous contrast.  CONTRAST:  160m OMNIPAQUE IOHEXOL 300 MG/ML  SOLN  COMPARISON:  11/03/2014 and  prior CTs  FINDINGS: Lower chest:  Minimal basilar atelectasis noted.  Hepatobiliary: No significant hepatic or gallbladder abnormalities. There is no evidence of biliary dilatation.  Pancreas: Unremarkable  Spleen: Unremarkable  Adrenals/Urinary Tract: Kidneys, adrenal glands and bladder are unremarkable.  Stomach/Bowel: There is circumferential wall thickening of a moderate length segment distal ileum with adjacent inflammation and small amount of interloop fluid. There is mild distention of small bowel loops just proximal to the moderate segment wall thickening. There is no evidence of abscess or pneumoperitoneum. The appendix is normal.  Vascular/Lymphatic: Prominent mesenteric lymph nodes are likely reactive. There is no evidence of vascular abnormality.  Reproductive: Uterus and adnexal regions are unremarkable.  Other: A trace amount of free pelvic fluid is noted.  Musculoskeletal: No acute or suspicious abnormalities identified.  IMPRESSION: Enteritis involving the distal ileum with adjacent inflammation and small amount of fluid. This likely is inflammatory, infectious or Crohn's disease. No evidence of pneumoperitoneum or abscess.   Electronically Signed   By: Margarette Canada M.D.   On: 11/14/2014 10:28   Ct Abdomen Pelvis W Contrast  11/02/2014   CLINICAL DATA:  Abdominal  pain since leaving the hospital a few weeks ago. 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  EXAM: CT ABDOMEN AND PELVIS WITH CONTRAST  TECHNIQUE: Multidetector CT imaging of the abdomen and pelvis was performed using the standard protocol following bolus administration of intravenous contrast.  CONTRAST:  116m OMNIPAQUE IOHEXOL 300 MG/ML  SOLN  COMPARISON:  10/11/2014  FINDINGS: 3 mm nodule in the left lung base without change since prior study.  Small focal fatty infiltration in the liver adjacent to the falciform ligament. Tiny sub cm low-attenuation lesion in the medial segment left lobe unchanged since prior study. This too small to characterize but probably represents a cyst or hemangioma. No other focal liver lesions. Gallbladder, spleen, pancreas, adrenal glands, kidneys, abdominal aorta, inferior vena cava, and retroperitoneal lymph nodes are unremarkable. Stomach, small bowel, and colon are not abnormally distended. Residual thickening of the wall of the distal ileum. This could represent residua of previous vascular compromise or inflammatory change. Visualized portions of the superior mesenteric artery and vein appear patent. No significant residual portal venous thrombosis. Small amount of free fluid in the lower abdominal mesenteric in the area of thickened bowel. No pneumatosis or portal venous gas. No free air. Mild prominence of mesenteric lymph nodes, probably reactive.  Pelvis: Small amount of free fluid in the pelvis could be reactive or physiologic. Uterus and ovaries are not enlarged. Mild bladder wall thickening may be due to under distention or cystitis. Appendix is normal. No pelvic mass or lymphadenopathy. No destructive bone lesions.  IMPRESSION: Residual bowel wall thickening in the low abdomen with small amount of adjacent free fluid. This could represent residual vascular compromise or inflammatory process. The previous portal venous thrombosis has resolved.   Electronically  Signed   By: WLucienne CapersM.D.   On: 11/02/2014 23:59   Ct Angio Abd/pel W/ And/or W/o  11/03/2014   CLINICAL DATA:  Severe left lower quadrant pain, and nausea. Patient with portal venous and cerebral venous thrombosis.  EXAM: CT ANGIOGRAPHY ABDOMEN AND PELVIS WITH CONTRAST AND WITHOUT CONTRAST  TECHNIQUE: Multidetector CT imaging of the abdomen and pelvis was performed using the standard protocol during bolus administration of intravenous contrast. Multiplanar reconstructed images including MIPs were obtained and reviewed to evaluate the vascular anatomy.  CONTRAST:  100 cc Omnipaque 350.  COMPARISON:  CT of the abdomen  pelvis dated 11/02/2014  FINDINGS: Arterial findings  Aorta  Normal in caliber, without filling defects or stenosis.  Celiac axis  Normal in caliber, without filling defects or stenosis.  Superior mesenteric  Normal in caliber, without filling defects or stenosis.  Left renal  Normal in caliber, without filling defects or stenosis. Accessory left renal artery is noted.  Right renal  Normal in caliber, without filling defects or stenosis.  Inferior mesenteric  Normal in caliber, without filling defects or stenosis.  Left iliac  Normal in caliber, without filling defects or stenosis.  Right iliac  Normal in caliber, without filling defects or stenosis.  Venous findings  No evidence of portal venous thrombosis.  Review of the MIP images confirms the above findings.  Nonvascular findings  There is mesenteric stranding and small amount of free fluid within the mid left abdomen. There is also a long segment of bowel thickening of the distal third of the ileum. The distal most ileum is noted to be distended and fluid-filled with maximum diameter of 2.7 cm. The terminal ileum is prominent. The appendix is normal. The remainder of the upper GI tract and colon are normal. Multiple sub cm rounded mesenteric lymph nodes are seen within the same area of the abdomen. There is a tiny amount of free fluid,  tracking along the left pericolic gutter. There is a small amount of free fluid within the pelvis as well.  Focal fatty infiltration along the falciform ligament of the liver is noted, otherwise the liver appears normal. The spleen, pancreas, bilateral adrenal glands, bilateral kidneys are normal. Vicarious excretion of contrast within the otherwise normal gallbladder is noted.  IMPRESSION: No evidence of a arterial or venous thrombosis.  Increased mesenteric stranding with interspersed shotty mesenteric lymph nodes within the mid left abdomen, in the area of long segment of thickening of the distal ileum. Thickening of the terminal ileum and a focal distention of up to 2.7 cm of the distal ileum leading to it. These findings may represent infectious, inflammatory or ischemic bowel changes, with associated reactive lymphadenopathy. Alternatively mesenteric adenitis is of consideration.   Electronically Signed   By: Fidela Salisbury M.D.   On: 11/03/2014 11:42    CBC  Recent Labs Lab 11/15/14 0452 11/16/14 0440 11/16/14 1607 11/18/14 0621 11/19/14 0356  WBC 2.3* 3.4* 4.3 3.8* 5.3  HGB 10.4* 10.8* 12.1 10.6* 11.2*  HCT 34.1* 34.5* 38.3 34.3* 35.2*  PLT 166 173 195 176 223  MCV 74.1* 73.7* 73.7* 73.1* 73.0*  MCH 22.6* 23.1* 23.3* 22.6* 23.2*  MCHC 30.5 31.3 31.6 30.9 31.8  RDW 27.2* 27.1* 26.6* 26.4* 25.7*  LYMPHSABS  --   --  2.0  --  0.6*  MONOABS  --   --  0.3  --  0.2  EOSABS  --   --  0.1  --  0.0  BASOSABS  --   --  0.0  --  0.0    Chemistries   Recent Labs Lab 11/14/14 0726 11/15/14 0452 11/16/14 0440 11/17/14 1010 11/18/14 0621 11/19/14 0356  NA 134* 138 140 140 138 138  K 3.9 3.9 4.1 3.9 3.8 4.0  CL 101 110 109 111 107 107  CO2 23 24 26 23 24 24   GLUCOSE 119* 92 86 99 95 148*  BUN 13 <5* <5* <5* <5* <5*  CREATININE 0.83 0.78 0.86 0.84 0.86 0.90  CALCIUM 9.0 8.1* 8.2* 8.2* 8.2* 9.0  MG  --   --  1.8 1.6* 1.9 1.6*  AST 25  --   --   --   --  19  ALT 19  --   --    --   --  19  ALKPHOS 51  --   --   --   --  46  BILITOT 0.8  --   --   --   --  0.7   ------------------------------------------------------------------------------------------------------------------ estimated creatinine clearance is 102.4 mL/min (by C-G formula based on Cr of 0.9). ------------------------------------------------------------------------------------------------------------------ No results for input(s): HGBA1C in the last 72 hours. ------------------------------------------------------------------------------------------------------------------ No results for input(s): CHOL, HDL, LDLCALC, TRIG, CHOLHDL, LDLDIRECT in the last 72 hours. ------------------------------------------------------------------------------------------------------------------ No results for input(s): TSH, T4TOTAL, T3FREE, THYROIDAB in the last 72 hours.  Invalid input(s): FREET3 ------------------------------------------------------------------------------------------------------------------ No results for input(s): VITAMINB12, FOLATE, FERRITIN, TIBC, IRON, RETICCTPCT in the last 72 hours.  Coagulation profile No results for input(s): INR, PROTIME in the last 168 hours.  No results for input(s): DDIMER in the last 72 hours.  Cardiac Enzymes No results for input(s): CKMB, TROPONINI, MYOGLOBIN in the last 168 hours.  Invalid input(s): CK ------------------------------------------------------------------------------------------------------------------ Invalid input(s): POCBNP  No results for input(s): GLUCAP in the last 72 hours.   Imani Sherrin M.D. [facility] 11/19/2014, 2:27 PM  Pager: [phone] Between 7am to 7pm - call Pager - [phone]  After 7pm go to [email or web address] - password [email or web address]  Call night coverage person covering after 7pm

## 2014-11-19 NOTE — Progress Notes (Signed)
[facility] Progress Note    Since last GI note: Colonoscopy with ileal examination yesterday, see full report in chart.  I started her on prednisone (21m bid) in case the ileal stricture represents IBD (Crohn's disease).  Objective: Vital signs in last 24 hours: Temp:  [97.5 F (36.4 C)-99.2 F (37.3 C)] 97.5 F (36.4 C) (09/10 0106) Pulse Rate:  [42-81] 58 (09/10 0549) Resp:  [12-22] 18 (09/10 0549) BP: (91-127)/(51-65) 108/57 mmHg (09/10 0549) SpO2:  [88 %-100 %] 94 % (09/10 0549) Last BM Date: 11/17/14 General: alert and oriented times 3 Heart: regular rate and rythm Abdomen: soft, non-tender, non-distended, normal bowel sounds   Lab Results:  Recent Labs  11/16/14 1607 11/18/14 0621 11/19/14 0356  WBC 4.3 3.8* 5.3  HGB 12.1 10.6* 11.2*  PLT 195 176 223  MCV 73.7* 73.1* 73.0*    Recent Labs  11/17/14 1010 11/18/14 0621 11/19/14 0356  NA 140 138 138  K 3.9 3.8 4.0  CL 111 107 107  CO2 23 24 24   GLUCOSE 99 95 148*  BUN <5* <5* PENDING  CREATININE 0.84 0.86 0.90  CALCIUM 8.2* 8.2* 9.0    Recent Labs  11/19/14 0356  PROT 5.0*  ALBUMIN 2.9*  AST 19  ALT 19  ALKPHOS 46  BILITOT 0.7    Medications: Scheduled Meds: . apixaban  5 mg Oral BID  . FLUoxetine  20 mg Oral Daily  . Influenza vac split quadrivalent PF  0.5 mL Intramuscular Tomorrow-1000  . levETIRAcetam  500 mg Oral BID  . Linaclotide  145 mcg Oral Daily  . methadone  85 mg Oral Daily  . metroNIDAZOLE  500 mg Oral 3 times per day  . nicotine  21 mg Transdermal Daily  . predniSONE  20 mg Oral BID WC   Continuous Infusions:  PRN Meds:.acetaminophen **OR** [DISCONTINUED] acetaminophen, dicyclomine, HYDROmorphone (DILAUDID) injection, ondansetron **OR** ondansetron (ZOFRAN) IV    Assessment/Plan: 24y.o. female with venous thrombosis (at least two separate occurences), distal ileum inflammation, stricture  Unclear etiology of the ileal inflammation, stricture. DDx  includes IBD (crohn's disease), ischemia (had PV thrombosis 1-2 months ago).  The stricture is not causing obstructive symptoms but she does have constant abd pain that may be related.  She is eating without pains, no vomiting or nausea.  Hopefully biopsies will help with diagnosis.  I started her on prednisone 243mtwice daily and her response to that may be helpful as well.  So far no improvement in pains but she's probably only take 1-2 doses of prednisone.  Will follow along.    JaMilus BanisterMD  11/19/2014, 7:42 AM [facility] Pager [phone]

## 2014-11-20 LAB — BASIC METABOLIC PANEL
Anion gap: 13 (ref 5–15)
BUN: 9 mg/dL (ref 6–20)
CO2: 18 mmol/L — ABNORMAL LOW (ref 22–32)
Calcium: 9.3 mg/dL (ref 8.9–10.3)
Chloride: 108 mmol/L (ref 101–111)
Creatinine, Ser: 0.66 mg/dL (ref 0.44–1.00)
GFR calc Af Amer: >60 mL/min (ref >60)
GFR calc non Af Amer: >60 mL/min (ref >60)
Glucose, Bld: 105 mg/dL — ABNORMAL HIGH (ref 65–99)
Potassium: 4.6 mmol/L (ref 3.5–5.1)
Sodium: 139 mmol/L (ref 135–145)

## 2014-11-20 LAB — CBC
HCT: 37.3 % (ref 36.0–46.0)
Hemoglobin: 11.7 g/dL — ABNORMAL LOW (ref 12.0–15.0)
MCH: 23.1 pg — ABNORMAL LOW (ref 26.0–34.0)
MCHC: 31.4 g/dL (ref 30.0–36.0)
MCV: 73.7 fL — ABNORMAL LOW (ref 78.0–100.0)
Platelets: 237 10*3/uL (ref 150–400)
RBC: 5.06 MIL/uL (ref 3.87–5.11)
RDW: 26.3 % — ABNORMAL HIGH (ref 11.5–15.5)
WBC: 5.7 10*3/uL (ref 4.0–10.5)

## 2014-11-20 NOTE — Progress Notes (Signed)
[facility]                                                                              Patient Demographics  Victoria Alvarez, is a 24 y.o. female, DOB - April 03, 1990, HOZ:[id number]  Admit date - 11/14/2014   Admitting Physician Kelvin Cellar, MD  Outpatient Primary MD for the patient is Minerva Ends, MD  LOS - 6   Chief Complaint  Patient presents with  . Abdominal Pain  . Emesis       Brief HPI   Per Dr Coralyn Pear   Victoria Alvarez is a 24 y.o. 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 Victoria Alvarez has been hospitalized 2x recently for similar symptoms. She was feeling better after discharge 8/27 but still having cramping pain that was relieved with bowel movements. Yesterday 9/4 she began having continuous abdominal pain not relieved with bowel movements. The pain is located thru out her abdomen. It stops her from eating. She had 1 episode of vomiting this am (no hematemesis). Her stools are soft, almost loose, and brown. She denies fevers, cough, sore throat, chest pain and difficulty breathing. In the ER her lactic acid is 2.88, wbc is 11.6, CT abdomen/pelvis shows: circumferential wall thickening in the distal ileum.   Assessment & Plan    Principal Problem: Abdominal Pain secondary to Enteritis -  patient continues to complain of abdominal pain, no significant improvement on steroids - CT abdomen showed enteritis involving the distal ileum with adjacent inflammation and small amount of fluid, likely inflammatory, infectious or Crohn's disease. No abscess  - ESR/CRP wnl. - on Flagyl (allergy to Cipro).Patient started on prednisone by GI incase the ileal stricture represents IBD  - Continue pain control - Colonoscopy 9/9 showed tight but not complete distal ileum stricture that appears ulcerated, inflamed with distal and about 10-15 cm  proximal to the IC valve, biopsies pending  - TB for possible needing biological agent. Hepatitis negative.  Lactic Acidosis - Likely due to acute process currently going on in her bowel. Resolved with 2L IVF.  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 Right sided weakness has resolved, no residual neurologic deficit per patient.  Seizure - Stable, continue Keppra  Chronic Pain / Hx of interstitial cystitis Continue 85 mg methadone daily.  Tobacco Abuse Nicotine Patch  History of depression Appears stable, denies any suicidal/homicidal ideation. Continue Prozac.  TSH 4.3, free T4 0.9   Questionable hypercoagulable state; Hx of miscarriage, stroke, multiple thrombosis.  On Eliquis, was scheduled to see Hematology in August. Unfortunately she was in the hospital and missed her appt, hematology follow up outpatient.  Mild leukopenia: unclear significance, resolved.  Code Status: full code   Family Communication: Discussed in detail with the patient, all imaging results, lab results explained to the patient    Disposition Plan: not medically ready  Time Spent in minutes  25 minutes  Procedures  colonoscopy  Consults   GI   DVT Prophylaxis eliquis  Medications  Scheduled Meds: .  apixaban  5 mg Oral BID  . FLUoxetine  20 mg Oral Daily  . Influenza vac split quadrivalent PF  0.5 mL Intramuscular Tomorrow-1000  . levETIRAcetam  500 mg Oral BID  . Linaclotide  145 mcg Oral Daily  . methadone  85 mg Oral Daily  . metroNIDAZOLE  500 mg Oral 3 times per day  . nicotine  21 mg Transdermal Daily  . predniSONE  20 mg Oral BID WC   Continuous Infusions:  PRN Meds:.acetaminophen **OR** [DISCONTINUED] acetaminophen, dicyclomine, HYDROmorphone (DILAUDID) injection, ondansetron **OR** ondansetron (ZOFRAN) IV   Antibiotics   Anti-infectives    Start     Dose/Rate Route Frequency Ordered Stop    11/18/14 1400  metroNIDAZOLE (FLAGYL) tablet 500 mg     500 mg Oral 3 times per day 11/18/14 0939     11/14/14 1530  metroNIDAZOLE (FLAGYL) IVPB 500 mg  Status:  Discontinued     500 mg 100 mL/hr over 60 Minutes Intravenous Every 8 hours 11/14/14 1429 11/18/14 3545        Subjective:   Victoria Alvarez was seen and examined today.  Complaining of abdominal pain, nausea however tolerating solid diet, afebrile. Patient denies dizziness, chest pain, shortness of breath,  new weakness, numbess, tingling. No acute events overnight.    Objective:   Blood pressure 115/89, pulse 51, temperature 98.6 F (37 C), temperature source Oral, resp. rate 16, height 5' 3" (1.6 m), weight 91.173 kg (201 lb), SpO2 98 %, not currently breastfeeding.  Wt Readings from Last 3 Encounters:  11/14/14 91.173 kg (201 lb)  11/06/14 91 kg (200 lb 9.9 oz)  10/07/14 77.111 kg (170 lb)     Intake/Output Summary (Last 24 hours) at 11/20/14 1145 Last data filed at 11/19/14 2306  Gross per 24 hour  Intake    720 ml  Output      4 ml  Net    716 ml    Exam  General: Alert and oriented x 3, NAD  HEENT:  PERRLA, EOMI,  Neck: Supple, no JVD, no masses  CVS: S1 S2 clear, RRR  Respiratory: CTAB  Abdomen: Soft, diffuse tenderness, nondistended, + bowel sounds  Ext: no cyanosis clubbing or edema  Neuro: no new deficits  Skin: No rashes  Psych: Normal affect and demeanor, alert and oriented x3    Data Review   Micro Results No results found for this or any previous visit (from the past 240 hour(s)).  Radiology Reports Dg Abd 1 View  11/15/2014   CLINICAL DATA:  Lower abdominal pain  EXAM: ABDOMEN - 1 VIEW  COMPARISON:  Abdominal radiographs October 14, 2014; CT abdomen and pelvis November 14, 2014  FINDINGS: There is moderate stool in the colon. There is no bowel dilatation or air-fluid level suggesting obstruction. No free air is seen on this supine examination. There are surgical clips in the left  pelvic region.  IMPRESSION: Bowel gas pattern overall unremarkable.   Electronically Signed   By: Lowella Grip III M.D.   On: 11/15/2014 12:45   Ct Abdomen Pelvis W Contrast  11/14/2014   CLINICAL DATA:  24 year old female with acute abdominal and pelvic pain with vomiting for 1 day.  EXAM: CT ABDOMEN AND PELVIS WITH CONTRAST  TECHNIQUE: Multidetector CT imaging of the abdomen and pelvis was performed using the standard protocol following bolus administration of intravenous contrast.  CONTRAST:  171m OMNIPAQUE IOHEXOL 300 MG/ML  SOLN  COMPARISON:  11/03/2014 and prior CTs  FINDINGS: Lower chest:  Minimal basilar atelectasis noted.  Hepatobiliary: No significant hepatic or gallbladder abnormalities. There is no evidence of biliary dilatation.  Pancreas: Unremarkable  Spleen: Unremarkable  Adrenals/Urinary Tract: Kidneys, adrenal glands and bladder are unremarkable.  Stomach/Bowel: There is circumferential wall thickening of a moderate length segment distal ileum with adjacent inflammation and small amount of interloop fluid. There is mild distention of small bowel loops just proximal to the moderate segment wall thickening. There is no evidence of abscess or pneumoperitoneum. The appendix is normal.  Vascular/Lymphatic: Prominent mesenteric lymph nodes are likely reactive. There is no evidence of vascular abnormality.  Reproductive: Uterus and adnexal regions are unremarkable.  Other: A trace amount of free pelvic fluid is noted.  Musculoskeletal: No acute or suspicious abnormalities identified.  IMPRESSION: Enteritis involving the distal ileum with adjacent inflammation and small amount of fluid. This likely is inflammatory, infectious or Crohn's disease. No evidence of pneumoperitoneum or abscess.   Electronically Signed   By: Margarette Canada M.D.   On: 11/14/2014 10:28   Ct Abdomen Pelvis W Contrast  11/02/2014   CLINICAL DATA:  Abdominal pain since leaving the hospital a few weeks ago. History of portal  vein thrombosis in pneumatosis.  EXAM: CT ABDOMEN AND PELVIS WITH CONTRAST  TECHNIQUE: Multidetector CT imaging of the abdomen and pelvis was performed using the standard protocol following bolus administration of intravenous contrast.  CONTRAST:  123m OMNIPAQUE IOHEXOL 300 MG/ML  SOLN  COMPARISON:  10/11/2014  FINDINGS: 3 mm nodule in the left lung base without change since prior study.  Small focal fatty infiltration in the liver adjacent to the falciform ligament. Tiny sub cm low-attenuation lesion in the medial segment left lobe unchanged since prior study. This too small to characterize but probably represents a cyst or hemangioma. No other focal liver lesions. Gallbladder, spleen, pancreas, adrenal glands, kidneys, abdominal aorta, inferior vena cava, and retroperitoneal lymph nodes are unremarkable. Stomach, small bowel, and colon are not abnormally distended. Residual thickening of the wall of the distal ileum. This could represent residua of previous vascular compromise or inflammatory change. Visualized portions of the superior mesenteric artery and vein appear patent. No significant residual portal venous thrombosis. Small amount of free fluid in the lower abdominal mesenteric in the area of thickened bowel. No pneumatosis or portal venous gas. No free air. Mild prominence of mesenteric lymph nodes, probably reactive.  Pelvis: Small amount of free fluid in the pelvis could be reactive or physiologic. Uterus and ovaries are not enlarged. Mild bladder wall thickening may be due to under distention or cystitis. Appendix is normal. No pelvic mass or lymphadenopathy. No destructive bone lesions.  IMPRESSION: Residual bowel wall thickening in the low abdomen with small amount of adjacent free fluid. This could represent residual vascular compromise or inflammatory process. The previous portal venous thrombosis has resolved.   Electronically Signed   By: WLucienne CapersM.D.   On: 11/02/2014 23:59   Ct Angio  Abd/pel W/ And/or W/o  11/03/2014   CLINICAL DATA:  Severe left lower quadrant pain, and nausea. Patient with portal venous and cerebral venous thrombosis.  EXAM: CT ANGIOGRAPHY ABDOMEN AND PELVIS WITH CONTRAST AND WITHOUT CONTRAST  TECHNIQUE: Multidetector CT imaging of the abdomen and pelvis was performed using the standard protocol during bolus administration of intravenous contrast. Multiplanar reconstructed images including MIPs were obtained and reviewed to evaluate the vascular anatomy.  CONTRAST:  100 cc Omnipaque 350.  COMPARISON:  CT of the abdomen pelvis dated 11/02/2014  FINDINGS: Arterial findings  Aorta  Normal in caliber, without filling defects or stenosis.  Celiac axis  Normal in caliber, without filling defects or stenosis.  Superior mesenteric  Normal in caliber, without filling defects or stenosis.  Left renal  Normal in caliber, without filling defects or stenosis. Accessory left renal artery is noted.  Right renal  Normal in caliber, without filling defects or stenosis.  Inferior mesenteric  Normal in caliber, without filling defects or stenosis.  Left iliac  Normal in caliber, without filling defects or stenosis.  Right iliac  Normal in caliber, without filling defects or stenosis.  Venous findings  No evidence of portal venous thrombosis.  Review of the MIP images confirms the above findings.  Nonvascular findings  There is mesenteric stranding and small amount of free fluid within the mid left abdomen. There is also a long segment of bowel thickening of the distal third of the ileum. The distal most ileum is noted to be distended and fluid-filled with maximum diameter of 2.7 cm. The terminal ileum is prominent. The appendix is normal. The remainder of the upper GI tract and colon are normal. Multiple sub cm rounded mesenteric lymph nodes are seen within the same area of the abdomen. There is a tiny amount of free fluid, tracking along the left pericolic gutter. There is a small amount of  free fluid within the pelvis as well.  Focal fatty infiltration along the falciform ligament of the liver is noted, otherwise the liver appears normal. The spleen, pancreas, bilateral adrenal glands, bilateral kidneys are normal. Vicarious excretion of contrast within the otherwise normal gallbladder is noted.  IMPRESSION: No evidence of a arterial or venous thrombosis.  Increased mesenteric stranding with interspersed shotty mesenteric lymph nodes within the mid left abdomen, in the area of long segment of thickening of the distal ileum. Thickening of the terminal ileum and a focal distention of up to 2.7 cm of the distal ileum leading to it. These findings may represent infectious, inflammatory or ischemic bowel changes, with associated reactive lymphadenopathy. Alternatively mesenteric adenitis is of consideration.   Electronically Signed   By: Fidela Salisbury M.D.   On: 11/03/2014 11:42    CBC  Recent Labs Lab 11/15/14 0452 11/16/14 0440 11/16/14 1607 11/18/14 0621 11/19/14 0356  WBC 2.3* 3.4* 4.3 3.8* 5.3  HGB 10.4* 10.8* 12.1 10.6* 11.2*  HCT 34.1* 34.5* 38.3 34.3* 35.2*  PLT 166 173 195 176 223  MCV 74.1* 73.7* 73.7* 73.1* 73.0*  MCH 22.6* 23.1* 23.3* 22.6* 23.2*  MCHC 30.5 31.3 31.6 30.9 31.8  RDW 27.2* 27.1* 26.6* 26.4* 25.7*  LYMPHSABS  --   --  2.0  --  0.6*  MONOABS  --   --  0.3  --  0.2  EOSABS  --   --  0.1  --  0.0  BASOSABS  --   --  0.0  --  0.0    Chemistries   Recent Labs Lab 11/14/14 0726  11/16/14 0440 11/17/14 1010 11/18/14 0621 11/19/14 0356 11/20/14 0402  NA 134*  < > 140 140 138 138 139  K 3.9  < > 4.1 3.9 3.8 4.0 4.6  CL 101  < > 109 111 107 107 108  CO2 23  < > _0 18*  GLUCOSE 119*  < > 86 99 95 148* 105*  BUN 13  < > <5* <5* <5* <5* 9  CREATININE 0.83  < > 0.86 0.84 0.86 0.90 0.66  CALCIUM 9.0  < >  8.2* 8.2* 8.2* 9.0 9.3  MG  --   --  1.8 1.6* 1.9 1.6*  --   AST 25  --   --   --   --  19  --   ALT 19  --   --   --   --  19  --    ALKPHOS 51  --   --   --   --  46  --   BILITOT 0.8  --   --   --   --  0.7  --   < > = values in this interval not displayed. ------------------------------------------------------------------------------------------------------------------ estimated creatinine clearance is 115.2 mL/min (by C-G formula based on Cr of 0.66). ------------------------------------------------------------------------------------------------------------------ No results for input(s): HGBA1C in the last 72 hours. ------------------------------------------------------------------------------------------------------------------ No results for input(s): CHOL, HDL, LDLCALC, TRIG, CHOLHDL, LDLDIRECT in the last 72 hours. ------------------------------------------------------------------------------------------------------------------ No results for input(s): TSH, T4TOTAL, T3FREE, THYROIDAB in the last 72 hours.  Invalid input(s): FREET3 ------------------------------------------------------------------------------------------------------------------ No results for input(s): VITAMINB12, FOLATE, FERRITIN, TIBC, IRON, RETICCTPCT in the last 72 hours.  Coagulation profile No results for input(s): INR, PROTIME in the last 168 hours.  No results for input(s): DDIMER in the last 72 hours.  Cardiac Enzymes No results for input(s): CKMB, TROPONINI, MYOGLOBIN in the last 168 hours.  Invalid input(s): CK ------------------------------------------------------------------------------------------------------------------ Invalid input(s): POCBNP  No results for input(s): GLUCAP in the last 72 hours.   RAI,RIPUDEEP M.D. [facility] 11/20/2014, 11:45 AM  Pager: [phone] Between 7am to 7pm - call Pager - [phone]  After 7pm go to [email or web address] - password [email or web address]  Call night coverage person covering after 7pm

## 2014-11-20 NOTE — Progress Notes (Signed)
[facility] Progress Note    Since last GI note: No improvement in abdominal pains since starting prednisone 2 days ago.  Biopsy results still pending.  Objective: Vital signs in last 24 hours: Temp:  [98.5 F (36.9 C)-98.8 F (37.1 C)] 98.6 F (37 C) (09/11 0622) Pulse Rate:  [51-106] 51 (09/11 0622) Resp:  [16-20] 16 (09/11 0622) BP: (115-130)/(75-89) 115/89 mmHg (09/11 0622) SpO2:  [98 %-99 %] 98 % (09/11 0622) Last BM Date: 11/17/14 General: alert and oriented times 3 Heart: regular rate and rythm Abdomen: soft, mildly tender throughout, non-distended, normal bowel sounds   Lab Results:  Recent Labs  11/18/14 0621 11/19/14 0356  WBC 3.8* 5.3  HGB 10.6* 11.2*  PLT 176 223  MCV 73.1* 73.0*    Recent Labs  11/18/14 0621 11/19/14 0356 11/20/14 0402  NA 138 138 139  K 3.8 4.0 4.6  CL 107 107 108  CO2 24 24 18*  GLUCOSE 95 148* 105*  BUN <5* <5* 9  CREATININE 0.86 0.90 0.66  CALCIUM 8.2* 9.0 9.3    Recent Labs  11/19/14 0356  PROT 5.0*  ALBUMIN 2.9*  AST 19  ALT 19  ALKPHOS 46  BILITOT 0.7    Medications: Scheduled Meds: . apixaban  5 mg Oral BID  . FLUoxetine  20 mg Oral Daily  . Influenza vac split quadrivalent PF  0.5 mL Intramuscular Tomorrow-1000  . levETIRAcetam  500 mg Oral BID  . Linaclotide  145 mcg Oral Daily  . methadone  85 mg Oral Daily  . metroNIDAZOLE  500 mg Oral 3 times per day  . nicotine  21 mg Transdermal Daily  . predniSONE  20 mg Oral BID WC   Continuous Infusions:  PRN Meds:.acetaminophen **OR** [DISCONTINUED] acetaminophen, dicyclomine, HYDROmorphone (DILAUDID) injection, ondansetron **OR** ondansetron (ZOFRAN) IV    Assessment/Plan: 24 y.o. female with blood clotting disorder (PV, cranial), with enteritis on CT, distal ileum stricture, inflammation.  Unclear etiology of the ileal inflammation, stricture. DDx includes IBD (crohn's disease), ischemia (had PV thrombosis 1-2 months ago). The stricture is  not causing obstructive symptoms but she does have constant abd pain that may be related. She is eating without pains, no vomiting or nausea. Hopefully biopsies will help with diagnosis. I started her on prednisone 26m twice daily and her response to that may be helpful as well. So far no improvement in pains. Will follow along.  JMilus Banister MD  11/20/2014, 9:03 AM [facility] Pager [phone]

## 2014-11-21 MED ORDER — OXYCODONE-ACETAMINOPHEN 5-325 MG PO TABS
1.0000 | ORAL_TABLET | ORAL | Status: DC | PRN
  Administered 2014-11-21 – 2014-11-23 (×10): 2 via ORAL
  Administered 2014-11-23: 1 via ORAL
  Administered 2014-11-23 (×2): 2 via ORAL
  Administered 2014-11-24: 1 via ORAL
  Administered 2014-11-24 – 2014-11-29 (×28): 2 via ORAL
  Administered 2014-11-29: 1 via ORAL
  Administered 2014-11-29 – 2014-12-05 (×32): 2 via ORAL
  Filled 2014-11-21 (×17): qty 2
  Filled 2014-11-21: qty 1
  Filled 2014-11-21 (×9): qty 2
  Filled 2014-11-21: qty 1
  Filled 2014-11-21 (×48): qty 2

## 2014-11-21 NOTE — Progress Notes (Signed)
[facility]                                                                              Patient Demographics  Victoria Alvarez, is a 24 y.o. female, DOB - April 01, 1990, GQB:[id number]  Admit date - 11/14/2014   Admitting Physician Kelvin Cellar, MD  Outpatient Primary MD for the patient is Minerva Ends, MD  LOS - 7   Chief Complaint  Patient presents with  . Abdominal Pain  . Emesis       Brief HPI   Per Dr Coralyn Pear   Victoria Alvarez is a 24 y.o. 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 Ms. Buelna has been hospitalized 2x recently for similar symptoms. She was feeling better after discharge 8/27 but still having cramping pain that was relieved with bowel movements. Yesterday 9/4 she began having continuous abdominal pain not relieved with bowel movements. The pain is located thru out her abdomen. It stops her from eating. She had 1 episode of vomiting this am (no hematemesis). Her stools are soft, almost loose, and brown. She denies fevers, cough, sore throat, chest pain and difficulty breathing. In the ER her lactic acid is 2.88, wbc is 11.6, CT abdomen/pelvis shows: circumferential wall thickening in the distal ileum.   Assessment & Plan    Principal Problem: Abdominal Pain secondary to Enteritis -  patient continues to complain of abdominal pain, no significant improvement on steroids, however no vomiting or fevers, eating solid food - CT abdomen showed enteritis involving the distal ileum with adjacent inflammation and small amount of fluid, likely inflammatory, infectious or Crohn's disease. No abscess  - ESR/CRP wnl. - on Flagyl (allergy to Cipro).Patient started on prednisone by GI incase the ileal stricture represents IBD, no significant improvement in her symptoms per patient  - Continue pain control - Colonoscopy 9/9 showed tight  but not complete distal ileum stricture that appears ulcerated, inflamed with distal and about 10-15 cm proximal to the IC valve, biopsies pending  - TB for possible needing biological agent. Hepatitis negative.  Lactic Acidosis - Likely due to acute process currently going on in her bowel. Resolved with 2L IVF.  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 Right sided weakness has resolved, no residual neurologic deficit per patient.  Seizure - Stable, continue Keppra  Chronic Pain / Hx of interstitial cystitis Continue 85 mg methadone daily.  Tobacco Abuse Nicotine Patch  History of depression Appears stable, denies any suicidal/homicidal ideation. Continue Prozac.  TSH 4.3, free T4 0.9   Questionable hypercoagulable state; Hx of miscarriage, stroke, multiple thrombosis.  On Eliquis, was scheduled to see Hematology in August. Unfortunately she was in the hospital and missed her appt, hematology follow up outpatient.  Mild leukopenia: unclear significance, resolved.  Code Status: full code   Family Communication: Discussed in detail with the patient, all imaging results, lab results explained to the patient    Disposition Plan: not medically ready  Time Spent in minutes  15 minutes  Procedures  colonoscopy  Consults   GI   DVT Prophylaxis eliquis  Medications  Scheduled Meds: . apixaban  5 mg Oral BID  . FLUoxetine  20 mg Oral Daily  . Influenza vac split quadrivalent PF  0.5 mL Intramuscular Tomorrow-1000  . levETIRAcetam  500 mg Oral BID  . Linaclotide  145 mcg Oral Daily  . methadone  85 mg Oral Daily  . metroNIDAZOLE  500 mg Oral 3 times per day  . nicotine  21 mg Transdermal Daily  . predniSONE  20 mg Oral BID WC   Continuous Infusions:  PRN Meds:.acetaminophen **OR** [DISCONTINUED] acetaminophen, dicyclomine, HYDROmorphone (DILAUDID) injection, ondansetron **OR** ondansetron  (ZOFRAN) IV   Antibiotics   Anti-infectives    Start     Dose/Rate Route Frequency Ordered Stop   11/18/14 1400  metroNIDAZOLE (FLAGYL) tablet 500 mg     500 mg Oral 3 times per day 11/18/14 0939     11/14/14 1530  metroNIDAZOLE (FLAGYL) IVPB 500 mg  Status:  Discontinued     500 mg 100 mL/hr over 60 Minutes Intravenous Every 8 hours 11/14/14 1429 11/18/14 7793        Subjective:   Trace Yodice was seen and examined today. Still complaining of abdominal pain and nausea. Eating solid diet without any difficulty. Afebrile. Patient denies dizziness, chest pain, shortness of breath,  new weakness, numbess, tingling. No acute events overnight.    Objective:   Blood pressure 117/62, pulse 46, temperature 98.4 F (36.9 C), temperature source Oral, resp. rate 18, height 5' 3"  (1.6 m), weight 91.173 kg (201 lb), SpO2 99 %, not currently breastfeeding.  Wt Readings from Last 3 Encounters:  11/14/14 91.173 kg (201 lb)  11/06/14 91 kg (200 lb 9.9 oz)  10/07/14 77.111 kg (170 lb)     Intake/Output Summary (Last 24 hours) at 11/21/14 1321 Last data filed at 11/21/14 1000  Gross per 24 hour  Intake   1808 ml  Output      2 ml  Net   1806 ml    Exam  General: Alert and oriented x 3, NAD  HEENT:  PERRLA, EOMI,  Neck: Supple  CVS: S1 S2 clear, RRR  Respiratory: CTAB  Abdomen: Soft, diffuse tenderness, nondistended, + bowel sounds  Ext: no cyanosis clubbing or edema  Neuro: no new deficits  Skin: No rashes  Psych: appears somewhat depressed, alert and oriented x3    Data Review   Micro Results No results found for this or any previous visit (from the past 240 hour(s)).  Radiology Reports Dg Abd 1 View  11/15/2014   CLINICAL DATA:  Lower abdominal pain  EXAM: ABDOMEN - 1 VIEW  COMPARISON:  Abdominal radiographs October 14, 2014; CT abdomen and pelvis November 14, 2014  FINDINGS: There is moderate stool in the colon. There is no bowel dilatation or air-fluid level  suggesting obstruction. No free air is seen on this supine examination. There are surgical clips in the left pelvic region.  IMPRESSION: Bowel gas pattern overall unremarkable.   Electronically Signed   By: Lowella Grip III M.D.   On: 11/15/2014 12:45   Ct Abdomen Pelvis W Contrast  11/14/2014   CLINICAL DATA:  24 year old female with acute abdominal and pelvic pain with vomiting for 1 day.  EXAM: CT ABDOMEN AND PELVIS WITH CONTRAST  TECHNIQUE: Multidetector CT imaging of the abdomen and pelvis was performed using the standard protocol following bolus administration of intravenous contrast.  CONTRAST:  196m OMNIPAQUE IOHEXOL 300 MG/ML  SOLN  COMPARISON:  11/03/2014 and prior CTs  FINDINGS: Lower chest:  Minimal basilar atelectasis noted.  Hepatobiliary: No significant hepatic or gallbladder abnormalities. There is no evidence of biliary dilatation.  Pancreas: Unremarkable  Spleen: Unremarkable  Adrenals/Urinary Tract: Kidneys, adrenal glands and bladder are unremarkable.  Stomach/Bowel: There is circumferential wall thickening of a moderate length segment distal ileum with adjacent inflammation and small amount of interloop fluid. There is mild distention of small bowel loops just proximal to the moderate segment wall thickening. There is no evidence of abscess or pneumoperitoneum. The appendix is normal.  Vascular/Lymphatic: Prominent mesenteric lymph nodes are likely reactive. There is no evidence of vascular abnormality.  Reproductive: Uterus and adnexal regions are unremarkable.  Other: A trace amount of free pelvic fluid is noted.  Musculoskeletal: No acute or suspicious abnormalities identified.  IMPRESSION: Enteritis involving the distal ileum with adjacent inflammation and small amount of fluid. This likely is inflammatory, infectious or Crohn's disease. No evidence of pneumoperitoneum or abscess.   Electronically Signed   By: Margarette Canada M.D.   On: 11/14/2014 10:28   Ct Abdomen Pelvis W  Contrast  11/02/2014   CLINICAL DATA:  Abdominal pain since leaving the hospital a few weeks ago. 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  EXAM: CT ABDOMEN AND PELVIS WITH CONTRAST  TECHNIQUE: Multidetector CT imaging of the abdomen and pelvis was performed using the standard protocol following bolus administration of intravenous contrast.  CONTRAST:  177m OMNIPAQUE IOHEXOL 300 MG/ML  SOLN  COMPARISON:  10/11/2014  FINDINGS: 3 mm nodule in the left lung base without change since prior study.  Small focal fatty infiltration in the liver adjacent to the falciform ligament. Tiny sub cm low-attenuation lesion in the medial segment left lobe unchanged since prior study. This too small to characterize but probably represents a cyst or hemangioma. No other focal liver lesions. Gallbladder, spleen, pancreas, adrenal glands, kidneys, abdominal aorta, inferior vena cava, and retroperitoneal lymph nodes are unremarkable. Stomach, small bowel, and colon are not abnormally distended. Residual thickening of the wall of the distal ileum. This could represent residua of previous vascular compromise or inflammatory change. Visualized portions of the superior mesenteric artery and vein appear patent. No significant residual portal venous thrombosis. Small amount of free fluid in the lower abdominal mesenteric in the area of thickened bowel. No pneumatosis or portal venous gas. No free air. Mild prominence of mesenteric lymph nodes, probably reactive.  Pelvis: Small amount of free fluid in the pelvis could be reactive or physiologic. Uterus and ovaries are not enlarged. Mild bladder wall thickening may be due to under distention or cystitis. Appendix is normal. No pelvic mass or lymphadenopathy. No destructive bone lesions.  IMPRESSION: Residual bowel wall thickening in the low abdomen with small amount of adjacent free fluid. This could represent residual vascular compromise or inflammatory process. The previous portal  venous thrombosis has resolved.   Electronically Signed   By: WLucienne CapersM.D.   On: 11/02/2014 23:59   Ct Angio Abd/pel W/ And/or W/o  11/03/2014   CLINICAL DATA:  Severe left lower quadrant pain, and nausea. Patient with portal venous and cerebral venous thrombosis.  EXAM: CT ANGIOGRAPHY ABDOMEN AND PELVIS WITH CONTRAST AND WITHOUT CONTRAST  TECHNIQUE: Multidetector CT imaging of the abdomen and pelvis was performed using the standard protocol during bolus administration of intravenous contrast. Multiplanar reconstructed images including MIPs were obtained and reviewed to evaluate the vascular anatomy.  CONTRAST:  100 cc Omnipaque 350.  COMPARISON:  CT of the abdomen pelvis dated 11/02/2014  FINDINGS: Arterial findings  Aorta  Normal in caliber, without filling defects or stenosis.  Celiac axis  Normal in caliber, without filling defects or stenosis.  Superior mesenteric  Normal in caliber, without filling defects or stenosis.  Left renal  Normal in caliber, without filling defects or stenosis. Accessory left renal artery is noted.  Right renal  Normal in caliber, without filling defects or stenosis.  Inferior mesenteric  Normal in caliber, without filling defects or stenosis.  Left iliac  Normal in caliber, without filling defects or stenosis.  Right iliac  Normal in caliber, without filling defects or stenosis.  Venous findings  No evidence of portal venous thrombosis.  Review of the MIP images confirms the above findings.  Nonvascular findings  There is mesenteric stranding and small amount of free fluid within the mid left abdomen. There is also a long segment of bowel thickening of the distal third of the ileum. The distal most ileum is noted to be distended and fluid-filled with maximum diameter of 2.7 cm. The terminal ileum is prominent. The appendix is normal. The remainder of the upper GI tract and colon are normal. Multiple sub cm rounded mesenteric lymph nodes are seen within the same area of  the abdomen. There is a tiny amount of free fluid, tracking along the left pericolic gutter. There is a small amount of free fluid within the pelvis as well.  Focal fatty infiltration along the falciform ligament of the liver is noted, otherwise the liver appears normal. The spleen, pancreas, bilateral adrenal glands, bilateral kidneys are normal. Vicarious excretion of contrast within the otherwise normal gallbladder is noted.  IMPRESSION: No evidence of a arterial or venous thrombosis.  Increased mesenteric stranding with interspersed shotty mesenteric lymph nodes within the mid left abdomen, in the area of long segment of thickening of the distal ileum. Thickening of the terminal ileum and a focal distention of up to 2.7 cm of the distal ileum leading to it. These findings may represent infectious, inflammatory or ischemic bowel changes, with associated reactive lymphadenopathy. Alternatively mesenteric adenitis is of consideration.   Electronically Signed   By: Fidela Salisbury M.D.   On: 11/03/2014 11:42    CBC  Recent Labs Lab 11/16/14 0440 11/16/14 1607 11/18/14 0621 11/19/14 0356 11/20/14 1230  WBC 3.4* 4.3 3.8* 5.3 5.7  HGB 10.8* 12.1 10.6* 11.2* 11.7*  HCT 34.5* 38.3 34.3* 35.2* 37.3  PLT 173 195 176 223 237  MCV 73.7* 73.7* 73.1* 73.0* 73.7*  MCH 23.1* 23.3* 22.6* 23.2* 23.1*  MCHC 31.3 31.6 30.9 31.8 31.4  RDW 27.1* 26.6* 26.4* 25.7* 26.3*  LYMPHSABS  --  2.0  --  0.6*  --   MONOABS  --  0.3  --  0.2  --   EOSABS  --  0.1  --  0.0  --   BASOSABS  --  0.0  --  0.0  --     Chemistries   Recent Labs Lab 11/16/14 0440 11/17/14 1010 11/18/14 0621 11/19/14 0356 11/20/14 0402  NA 140 140 138 138 139  K 4.1 3.9 3.8 4.0 4.6  CL 109 111 107 107 108  CO2 26 23 24 24  18*  GLUCOSE 86 99 95 148* 105*  BUN <5* <5* <5* <5* 9  CREATININE 0.86 0.84 0.86 0.90 0.66  CALCIUM 8.2* 8.2* 8.2* 9.0 9.3  MG 1.8 1.6* 1.9 1.6*  --   AST  --   --   --  19  --   ALT  --   --   --  19  --    ALKPHOS  --   --   --  46  --   BILITOT  --   --   --  0.7  --    ------------------------------------------------------------------------------------------------------------------ estimated creatinine clearance is 115.2 mL/min (by C-G formula based on Cr of 0.66). ------------------------------------------------------------------------------------------------------------------ No results for input(s): HGBA1C in the last 72 hours. ------------------------------------------------------------------------------------------------------------------ No results for input(s): CHOL, HDL, LDLCALC, TRIG, CHOLHDL, LDLDIRECT in the last 72 hours. ------------------------------------------------------------------------------------------------------------------ No results for input(s): TSH, T4TOTAL, T3FREE, THYROIDAB in the last 72 hours.  Invalid input(s): FREET3 ------------------------------------------------------------------------------------------------------------------ No results for input(s): VITAMINB12, FOLATE, FERRITIN, TIBC, IRON, RETICCTPCT in the last 72 hours.  Coagulation profile No results for input(s): INR, PROTIME in the last 168 hours.  No results for input(s): DDIMER in the last 72 hours.  Cardiac Enzymes No results for input(s): CKMB, TROPONINI, MYOGLOBIN in the last 168 hours.  Invalid input(s): CK ------------------------------------------------------------------------------------------------------------------ Invalid input(s): POCBNP  No results for input(s): GLUCAP in the last 72 hours.   Erian Rosengren M.D. [facility] 11/21/2014, 1:21 PM  Pager: [phone] Between 7am to 7pm - call Pager - [phone]  After 7pm go to [email or web address] - password [email or web address]  Call night coverage person covering after 7pm

## 2014-11-21 NOTE — Anesthesia Postprocedure Evaluation (Signed)
  Anesthesia Post-op Note  Patient: Victoria Alvarez  Procedure(s) Performed: Procedure(s): CANCELLED PROCEDURE  Procedure was cancelled in preop area.

## 2014-11-21 NOTE — Progress Notes (Signed)
Daily Rounding Note  11/21/2014, 8:38 AM  LOS: 7 days   SUBJECTIVE:       Diffuse abd pain persists.  Nausea, no emesis.  No BMs since colonoscopy Consistently eating 75% of meals.   OBJECTIVE:         Vital signs in last 24 hours:    Temp:  [98.4 F (36.9 C)-98.6 F (37 C)] 98.4 F (36.9 C) (09/12 0523) Pulse Rate:  [46-67] 46 (09/12 0523) Resp:  [18] 18 (09/12 0523) BP: (117-123)/(59-75) 117/62 mmHg (09/12 0523) SpO2:  [97 %-100 %] 99 % (09/12 0523) Last BM Date: 11/19/14 Filed Weights   11/14/14 0715 11/14/14 1432  Weight: 200 lb 9.9 oz (91 kg) 201 lb (91.173 kg)   General: looks well, depressed   Heart: RRR Chest: clear bil.  Abdomen: soft, active BS, slightly obese, ND.  Tenderness greatest in RLQ.  No guard or rebound  Extremities: no CCE Neuro/Psych:  Depressed, moves all 4s.  Oriented.   Intake/Output from previous day: 09/11 0701 - 09/12 0700 In: 1106 [P.O.:1106] Out: -   Intake/Output this shift: Total I/O In: 222 [P.O.:222] Out: -   Lab Results:  Recent Labs  11/19/14 0356 11/20/14 1230  WBC 5.3 5.7  HGB 11.2* 11.7*  HCT 35.2* 37.3  PLT 223 237   BMET  Recent Labs  11/19/14 0356 11/20/14 0402  NA 138 139  K 4.0 4.6  CL 107 108  CO2 24 18*  GLUCOSE 148* 105*  BUN <5* 9  CREATININE 0.90 0.66  CALCIUM 9.0 9.3   LFT  Recent Labs  11/19/14 0356  PROT 5.0*  ALBUMIN 2.9*  AST 19  ALT 19  ALKPHOS 46  BILITOT 0.7   PT/INR No results for input(s): LABPROT, INR in the last 72 hours. Hepatitis Panel  Recent Labs  11/18/14 1150  HCVAB <0.1    Studies/Results: No results found.  ASSESMENT:   *  Enteritis, distal ileal stricture and inflammation biopsied on 9/9 colonoscopy.  Path pending.  Rule out Crohn's and now day 2 of empiric 40 mg prednisone, .  Rule out ischemic.  Rule out infectious, day 3 oral flagyl.   Baseline chronic methadone, originally on other  narcotic anelgesia since age 63 for interstitial cystitis. .  Baseline chronic constipation on Linzess.   *  Clotting disorder.  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    PLAN   *  Supportive care.  Await path report.     Azucena Freed  11/21/2014, 8:38 AM Pager: [phone]  Attending Addendum: I have taken an interval history, reviewed the chart, and examined the patient. I agree with the Advanced Practitioner's note and impression. Patient had recent colonoscopy showing segment of ileal inflammation. Differential includes ischemic (history of clots, PV thrombosis) although this would seem isolated for ischemia and a short segment. Crohns, vasculitis, infectious are other possibilities. The patient is also on chronic narcotics, and in regards to her pain, potentially has a component of narcotic bowel as well, and would recommend weaning narcotics as tolerated. Overall, she is tolerating a diet and is stable but pain is severe and persistent. Awaiting pathology results, agree with empiric steroids although no improvement after 3 days. Hopefully path back tomorrow. Will repeat ESR/CRP and send GI pathogen panel to ensure no rare infectious etiology. TB testing pending as well. She agreed, discussed this with patient at length and hope to have more information back when biopsies available.  Lumberton Cellar, MD [facility] Pager [phone]

## 2014-11-22 ENCOUNTER — Inpatient Hospital Stay (HOSPITAL_COMMUNITY): Payer: Medicaid Other

## 2014-11-22 ENCOUNTER — Encounter (HOSPITAL_COMMUNITY): Payer: Self-pay | Admitting: Gastroenterology

## 2014-11-22 LAB — QUANTIFERON IN TUBE
QFT TB AG MINUS NIL VALUE: 0.03 IU/mL
QUANTIFERON MITOGEN VALUE: 7.23 IU/mL
QUANTIFERON TB AG VALUE: 0.06 IU/mL
QUANTIFERON TB GOLD: NEGATIVE
Quantiferon Nil Value: 0.03 IU/mL

## 2014-11-22 LAB — SEDIMENTATION RATE: Sed Rate: 0 mm/hr (ref 0–22)

## 2014-11-22 LAB — QUANTIFERON TB GOLD ASSAY (BLOOD)

## 2014-11-22 MED ORDER — METHYLPREDNISOLONE SODIUM SUCC 125 MG IJ SOLR
60.0000 mg | Freq: Every day | INTRAMUSCULAR | Status: DC
  Administered 2014-11-22 – 2014-11-25 (×4): 60 mg via INTRAVENOUS
  Filled 2014-11-22 (×4): qty 2

## 2014-11-22 MED ORDER — INFLUENZA VAC SPLIT QUAD 0.5 ML IM SUSY: 0.5000 mL | PREFILLED_SYRINGE | INTRAMUSCULAR | Status: DC | PRN

## 2014-11-22 NOTE — Progress Notes (Signed)
Very poor venous access, multiple attempts without success by IV team. Recommend PICC placement please.

## 2014-11-22 NOTE — Progress Notes (Addendum)
Daily Rounding Note  11/22/2014, 7:54 AM  LOS: 8 days   SUBJECTIVE:       Continues to eat well.  Constant abd pain persists. Yesterday used 4 mg oxycodone, 3 mg Dilaudid, 85 mg Methadone (this is her outpt med too)  OBJECTIVE:         Vital signs in last 24 hours:    Temp:  [98.1 F (36.7 C)-98.6 F (37 C)] 98.1 F (36.7 C) (09/13 0539) Pulse Rate:  [49-74] 49 (09/13 0539) Resp:  [16-18] 16 (09/13 0539) BP: (97-126)/(55-84) 97/55 mmHg (09/13 0539) SpO2:  [94 %-99 %] 94 % (09/13 0539) Last BM Date: 11/19/14 Filed Weights   11/14/14 0715 11/14/14 1432  Weight: 200 lb 9.9 oz (91 kg) 201 lb (91.173 kg)   General: other than her discomfort, pt looks very well   Heart: RRR Chest: clear bil.  No dyspnea or cough Abdomen: obese, active BS, soft.  Tenderness in RLQ, no guard or rebound  Extremities: no CCE Neuro/Psych:  Oriented x 3.  Moves all 4s, no gross deficits.  Depressed.   Intake/Output from previous day: 09/12 0701 - 09/13 0700 In: 2546 [P.O.:2546] Out: 8 [Urine:8]  Intake/Output this shift:    Lab Results:  Recent Labs  11/20/14 1230  WBC 5.7  HGB 11.7*  HCT 37.3  PLT 237   BMET  Recent Labs  11/20/14 0402  NA 139  K 4.6  CL 108  CO2 18*  GLUCOSE 105*  BUN 9  CREATININE 0.66  CALCIUM 9.3    Studies/Results: No results found.  Scheduled Meds: . apixaban  5 mg Oral BID  . FLUoxetine  20 mg Oral Daily  . levETIRAcetam  500 mg Oral BID  . Linaclotide  145 mcg Oral Daily  . methadone  85 mg Oral Daily  . metroNIDAZOLE  500 mg Oral 3 times per day  . nicotine  21 mg Transdermal Daily  . predniSONE  20 mg Oral BID WC   Continuous Infusions:  PRN Meds:.acetaminophen **OR** [DISCONTINUED] acetaminophen, dicyclomine, Influenza vac split quadrivalent PF, ondansetron **OR** ondansetron (ZOFRAN) IV, oxyCODONE-acetaminophen   ASSESMENT:   * Enteritis, distal ileal stricture and  inflammation biopsied on 9/9 colonoscopy. Path pending. Rule out Crohn's and now day 2 of empiric 40 mg prednisone, . Rule out ischemic. Rule out infectious, day 3 oral flagyl.r/o vasculitis. ? Element of narcotic bowel? Baseline chronic methadone, originally on other narcotic anelgesia since age 24 for interstitial cystitis. .  Baseline chronic constipation on Linzess.  Quantiferon gold TB test negative.  Stool pathogen panel pending.   * Clotting disorder. 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    *  Chronic constipation, on Linzess, still no BM since prep 9/8-9/9   PLAN   *  Await pathology report from 9/9.  Hopefully will be back today.   *  Pain mgt issues anticipated going forward.   *  Ordered a KUB.     Jennye Moccasin  11/22/2014, 7:54 AM Pager: [phone]  Addendum at 3:40 PM: Biopsy showing chronic active ileitis, c/w IBD (favored dx) or NSAID injury.  KUB shows   Nonobstructive bowel gas pattern with evidence of constipation. Long tubular structure which seems to arise from wall are overlie the distal cecum. This may represent a gas containing thickened distal ileum, or abnormal appendix. In correlation to CT of the abdomen dated 11/14/2014, there is no evidence of appendicitis, and the distal ileum appears inflamed.  Therefore, this radiographic finding likely correlates to the segment of abnormal ileum. Radiographic or CT follow-up is recommended.    Attending Addendum: I have taken an interval history, reviewed the chart, and examined the patient. I agree with the Advanced Practitioner's note and impression. Patient has not had improvement on oral prednisone to date, continues to have pain. Xray findings as above although she is not acting obstructed. Pathology results came back this afternoon c/w chronic active ileitis (i.e. most likely Crohns). She does not take NSAIDs. Vasculitis, lymphoma, or possibly ischemic stricture could be on the differential as well. I contacted  pathology to review these results with them but they were not available at this hour. We will be discussing her case tomorrow morning with pathology. Her case is a bit atypical for Crohns - normal inflammatory markers and WBC, lack of response to steroids to date. TB testing is negative. If this does represent Crohns, recommend trial of IV steroids, and consideration for then either Remicade or surgical resection. Given her failure of  prednisone, and uncertainty of her diagnosis, prior to exposure to Remicade, surgery would confirm the diagnosis and may not be unreasonable up front given the severity of her stricture and that it appears to be a relatively short segment. We could also consider an MR enterography to restage the segment prior to consideration for surgery, and assess response to steroids. We will start IV steroids tonight, reassess tomorrow AM and may have surgical consult tomorrow, following pathology review. I have discussed my recommendations at length with her. I think at this time her preference would be to opt for surgical therapy up front. In the interim, will back off diet as her pain seems worse today, will put on full liquid and see if this helps. She agreed with the plan.   [name], MD [facility] Pager [phone]

## 2014-11-22 NOTE — Progress Notes (Signed)
[facility]                                                                              Patient Demographics  Victoria Alvarez, is a 24 y.o. female, DOB - 06/20/1990, [id number]  Admit date - 11/14/2014   Admitting Physician Kelvin Cellar, MD  Outpatient Primary MD for the patient is Minerva Ends, MD  LOS - 8   Chief Complaint  Patient presents with  . Abdominal Pain  . Emesis       Brief HPI   Per Dr Coralyn Pear   Victoria Alvarez is a 24 y.o. 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 Victoria Alvarez has been hospitalized 2x recently for similar symptoms. She was feeling better after discharge 8/27 but still having cramping pain that was relieved with bowel movements. Yesterday 9/4 she began having continuous abdominal pain not relieved with bowel movements. The pain is located thru out her abdomen. It stops her from eating. She had 1 episode of vomiting this am (no hematemesis). Her stools are soft, almost loose, and brown. She denies fevers, cough, sore throat, chest pain and difficulty breathing. In the ER her lactic acid is 2.88, wbc is 11.6, CT abdomen/pelvis shows: circumferential wall thickening in the distal ileum.   Assessment & Plan    Principal Problem: Abdominal Pain secondary to Enteritis -Continues to complain of abdominal pain and no significant improvement on steroids. - CT abdomen showed enteritis involving the distal ileum with adjacent inflammation and small amount of fluid, likely inflammatory, infectious or Crohn's disease. No abscess. ESR/CRP wnl. - on Flagyl (allergy to Cipro).Patient started on prednisone by GI incase the ileal stricture represents IBD, no significant improvement in her symptoms per patient  - Colonoscopy 9/9 showed tight but not complete distal ileum stricture that appears ulcerated, inflamed with distal  and about 10-15 cm proximal to the IC valve, biopsies pending  - Continue Bentyl, pain control, Flagyl, prednisone  Lactic Acidosis - Likely due to acute process currently going on in her bowel. Resolved with 2L IVF.  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 Right sided weakness has resolved, no residual neurologic deficit per patient.  Seizure - Stable, continue Keppra  Chronic Pain / Hx of interstitial cystitis Continue 85 mg methadone daily.  Tobacco Abuse Nicotine Patch  History of depression Appears stable, denies any suicidal/homicidal ideation. Continue Prozac.  TSH 4.3, free T4 0.9   Questionable hypercoagulable state; Hx of miscarriage, stroke, multiple thrombosis.  On Eliquis, was scheduled to see Hematology in August. Unfortunately she was in the hospital and missed her appt, hematology follow up outpatient.  Mild leukopenia: unclear significance, resolved.  Code Status: full code   Family Communication: Discussed in detail with the patient, all imaging results, lab results explained to the patient    Disposition Plan: not medically ready  Time Spent in minutes  25 minutes  Procedures  colonoscopy  Consults   GI   DVT Prophylaxis eliquis  Medications  Scheduled Meds: . apixaban  5 mg Oral BID  . FLUoxetine  20 mg Oral Daily  . Influenza vac split quadrivalent PF  0.5 mL Intramuscular Tomorrow-1000  . levETIRAcetam  500 mg Oral BID  . Linaclotide  145 mcg Oral Daily  . methadone  85 mg Oral Daily  . metroNIDAZOLE  500 mg Oral 3 times per day  . nicotine  21 mg Transdermal Daily  . predniSONE  20 mg Oral BID WC   Continuous Infusions:  PRN Meds:.acetaminophen **OR** [DISCONTINUED] acetaminophen, dicyclomine, ondansetron **OR** ondansetron (ZOFRAN) IV, oxyCODONE-acetaminophen   Antibiotics   Anti-infectives    Start     Dose/Rate Route Frequency Ordered Stop   11/18/14  1400  metroNIDAZOLE (FLAGYL) tablet 500 mg     500 mg Oral 3 times per day 11/18/14 0939     11/14/14 1530  metroNIDAZOLE (FLAGYL) IVPB 500 mg  Status:  Discontinued     500 mg 100 mL/hr over 60 Minutes Intravenous Every 8 hours 11/14/14 1429 11/18/14 5573        Subjective:   Victoria Alvarez was seen and examined today. Tolerating solid diet, still complaining of diffuse abdominal pain. Patient denies dizziness, chest pain, shortness of breath,  new weakness, numbess, tingling. No acute events overnight.    Objective:   Blood pressure 97/55, pulse 49, temperature 98.1 F (36.7 C), temperature source Oral, resp. rate 16, height _0  (1.6 m), weight 91.173 kg (201 lb), SpO2 94 %, not currently breastfeeding.  Wt Readings from Last 3 Encounters:  11/14/14 91.173 kg (201 lb)  11/06/14 91 kg (200 lb 9.9 oz)  10/07/14 77.111 kg (170 lb)     Intake/Output Summary (Last 24 hours) at 11/22/14 1257 Last data filed at 11/22/14 0539  Gross per 24 hour  Intake   1844 ml  Output      6 ml  Net   1838 ml    Exam  General: Alert and oriented x 3, NAD  HEENT:  PERRLA, EOMI,  Neck: Supple  CVS: S1 S2 clear, RRR  Respiratory: Clear to auscultation bilaterally  Abdomen: Soft, diffuse tenderness, nondistended, + bowel sounds  Ext: no cyanosis clubbing or edema  Neuro: no new deficits  Skin: No rashes  Psych: alert and oriented x3    Data Review   Micro Results No results found for this or any previous visit (from the past 240 hour(s)).  Radiology Reports Dg Abd 1 View  11/15/2014   CLINICAL DATA:  Lower abdominal pain  EXAM: ABDOMEN - 1 VIEW  COMPARISON:  Abdominal radiographs October 14, 2014; CT abdomen and pelvis November 14, 2014  FINDINGS: There is moderate stool in the colon. There is no bowel dilatation or air-fluid level suggesting obstruction. No free air is seen on this supine examination. There are surgical clips in the left pelvic region.  IMPRESSION: Bowel gas  pattern overall unremarkable.   Electronically Signed   By: Lowella Grip III M.D.   On: 11/15/2014 12:45   Ct Abdomen Pelvis W Contrast  11/14/2014   CLINICAL DATA:  24 year old female with acute abdominal and pelvic pain with vomiting for 1 day.  EXAM: CT ABDOMEN AND PELVIS WITH CONTRAST  TECHNIQUE: Multidetector CT imaging of the abdomen and pelvis was performed using the standard protocol following bolus administration of intravenous contrast.  CONTRAST:  14m OMNIPAQUE IOHEXOL 300 MG/ML  SOLN  COMPARISON:  11/03/2014 and prior CTs  FINDINGS: Lower chest:  Minimal basilar atelectasis noted.  Hepatobiliary: No significant hepatic or gallbladder abnormalities.  There is no evidence of biliary dilatation.  Pancreas: Unremarkable  Spleen: Unremarkable  Adrenals/Urinary Tract: Kidneys, adrenal glands and bladder are unremarkable.  Stomach/Bowel: There is circumferential wall thickening of a moderate length segment distal ileum with adjacent inflammation and small amount of interloop fluid. There is mild distention of small bowel loops just proximal to the moderate segment wall thickening. There is no evidence of abscess or pneumoperitoneum. The appendix is normal.  Vascular/Lymphatic: Prominent mesenteric lymph nodes are likely reactive. There is no evidence of vascular abnormality.  Reproductive: Uterus and adnexal regions are unremarkable.  Other: A trace amount of free pelvic fluid is noted.  Musculoskeletal: No acute or suspicious abnormalities identified.  IMPRESSION: Enteritis involving the distal ileum with adjacent inflammation and small amount of fluid. This likely is inflammatory, infectious or Crohn's disease. No evidence of pneumoperitoneum or abscess.   Electronically Signed   By: Margarette Canada M.D.   On: 11/14/2014 10:28   Ct Abdomen Pelvis W Contrast  11/02/2014   CLINICAL DATA:  Abdominal pain since leaving the hospital a few weeks ago. 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  EXAM:  CT ABDOMEN AND PELVIS WITH CONTRAST  TECHNIQUE: Multidetector CT imaging of the abdomen and pelvis was performed using the standard protocol following bolus administration of intravenous contrast.  CONTRAST:  162m OMNIPAQUE IOHEXOL 300 MG/ML  SOLN  COMPARISON:  10/11/2014  FINDINGS: 3 mm nodule in the left lung base without change since prior study.  Small focal fatty infiltration in the liver adjacent to the falciform ligament. Tiny sub cm low-attenuation lesion in the medial segment left lobe unchanged since prior study. This too small to characterize but probably represents a cyst or hemangioma. No other focal liver lesions. Gallbladder, spleen, pancreas, adrenal glands, kidneys, abdominal aorta, inferior vena cava, and retroperitoneal lymph nodes are unremarkable. Stomach, small bowel, and colon are not abnormally distended. Residual thickening of the wall of the distal ileum. This could represent residua of previous vascular compromise or inflammatory change. Visualized portions of the superior mesenteric artery and vein appear patent. No significant residual portal venous thrombosis. Small amount of free fluid in the lower abdominal mesenteric in the area of thickened bowel. No pneumatosis or portal venous gas. No free air. Mild prominence of mesenteric lymph nodes, probably reactive.  Pelvis: Small amount of free fluid in the pelvis could be reactive or physiologic. Uterus and ovaries are not enlarged. Mild bladder wall thickening may be due to under distention or cystitis. Appendix is normal. No pelvic mass or lymphadenopathy. No destructive bone lesions.  IMPRESSION: Residual bowel wall thickening in the low abdomen with small amount of adjacent free fluid. This could represent residual vascular compromise or inflammatory process. The previous portal venous thrombosis has resolved.   Electronically Signed   By: WLucienne CapersM.D.   On: 11/02/2014 23:59   Dg Abd Portable 1v  11/22/2014   CLINICAL  DATA:  Right lower quadrant pain with associated mild generalized abdominal pain.  EXAM: PORTABLE ABDOMEN - 1 VIEW  COMPARISON:  11/17/2014  FINDINGS: The bowel gas pattern is nonobstructive. There is a long tubular radiolucent structure which seen to arise from the cecum, with associated surrounding soft tissue thickening.  No radio-opaque calculi or other significant radiographic abnormality are seen. Moderate amount of stool throughout the colon. No radiographic evidence of organomegaly. Osseous structures are grossly normal.  IMPRESSION: Nonobstructive bowel gas pattern with evidence of constipation.  Long tubular structure which seems to arise from wall are  overlie the distal cecum. This may represent a gas containing thickened distal ileum, or abnormal appendix. In correlation to CT of the abdomen dated 11/14/2014, there is no evidence of appendicitis, and the distal ileum appears inflamed. Therefore, this radiographic finding likely correlates to the segment of abnormal ileum. Radiographic or CT follow-up is recommended.  These results will be called to the ordering clinician or representative by the Radiologist Assistant, and communication documented in the PACS or zVision Dashboard.   Electronically Signed   By: Fidela Salisbury M.D.   On: 11/22/2014 12:15   Ct Angio Abd/pel W/ And/or W/o  11/03/2014   CLINICAL DATA:  Severe left lower quadrant pain, and nausea. Patient with portal venous and cerebral venous thrombosis.  EXAM: CT ANGIOGRAPHY ABDOMEN AND PELVIS WITH CONTRAST AND WITHOUT CONTRAST  TECHNIQUE: Multidetector CT imaging of the abdomen and pelvis was performed using the standard protocol during bolus administration of intravenous contrast. Multiplanar reconstructed images including MIPs were obtained and reviewed to evaluate the vascular anatomy.  CONTRAST:  100 cc Omnipaque 350.  COMPARISON:  CT of the abdomen pelvis dated 11/02/2014  FINDINGS: Arterial findings  Aorta  Normal in caliber,  without filling defects or stenosis.  Celiac axis  Normal in caliber, without filling defects or stenosis.  Superior mesenteric  Normal in caliber, without filling defects or stenosis.  Left renal  Normal in caliber, without filling defects or stenosis. Accessory left renal artery is noted.  Right renal  Normal in caliber, without filling defects or stenosis.  Inferior mesenteric  Normal in caliber, without filling defects or stenosis.  Left iliac  Normal in caliber, without filling defects or stenosis.  Right iliac  Normal in caliber, without filling defects or stenosis.  Venous findings  No evidence of portal venous thrombosis.  Review of the MIP images confirms the above findings.  Nonvascular findings  There is mesenteric stranding and small amount of free fluid within the mid left abdomen. There is also a long segment of bowel thickening of the distal third of the ileum. The distal most ileum is noted to be distended and fluid-filled with maximum diameter of 2.7 cm. The terminal ileum is prominent. The appendix is normal. The remainder of the upper GI tract and colon are normal. Multiple sub cm rounded mesenteric lymph nodes are seen within the same area of the abdomen. There is a tiny amount of free fluid, tracking along the left pericolic gutter. There is a small amount of free fluid within the pelvis as well.  Focal fatty infiltration along the falciform ligament of the liver is noted, otherwise the liver appears normal. The spleen, pancreas, bilateral adrenal glands, bilateral kidneys are normal. Vicarious excretion of contrast within the otherwise normal gallbladder is noted.  IMPRESSION: No evidence of a arterial or venous thrombosis.  Increased mesenteric stranding with interspersed shotty mesenteric lymph nodes within the mid left abdomen, in the area of long segment of thickening of the distal ileum. Thickening of the terminal ileum and a focal distention of up to 2.7 cm of the distal ileum leading to  it. These findings may represent infectious, inflammatory or ischemic bowel changes, with associated reactive lymphadenopathy. Alternatively mesenteric adenitis is of consideration.   Electronically Signed   By: Fidela Salisbury M.D.   On: 11/03/2014 11:42    CBC  Recent Labs Lab 11/16/14 0440 11/16/14 1607 11/18/14 0621 11/19/14 0356 11/20/14 1230  WBC 3.4* 4.3 3.8* 5.3 5.7  HGB 10.8* 12.1 10.6* 11.2* 11.7*  HCT  34.5* 38.3 34.3* 35.2* 37.3  PLT 173 195 176 223 237  MCV 73.7* 73.7* 73.1* 73.0* 73.7*  MCH 23.1* 23.3* 22.6* 23.2* 23.1*  MCHC 31.3 31.6 30.9 31.8 31.4  RDW 27.1* 26.6* 26.4* 25.7* 26.3*  LYMPHSABS  --  2.0  --  0.6*  --   MONOABS  --  0.3  --  0.2  --   EOSABS  --  0.1  --  0.0  --   BASOSABS  --  0.0  --  0.0  --     Chemistries   Recent Labs Lab 11/16/14 0440 11/17/14 1010 11/18/14 0621 11/19/14 0356 11/20/14 0402  NA 140 140 138 138 139  K 4.1 3.9 3.8 4.0 4.6  CL 109 111 107 107 108  CO2 _0 18*  GLUCOSE 86 99 95 148* 105*  BUN <5* <5* <5* <5* 9  CREATININE 0.86 0.84 0.86 0.90 0.66  CALCIUM 8.2* 8.2* 8.2* 9.0 9.3  MG 1.8 1.6* 1.9 1.6*  --   AST  --   --   --  19  --   ALT  --   --   --  19  --   ALKPHOS  --   --   --  46  --   BILITOT  --   --   --  0.7  --    ------------------------------------------------------------------------------------------------------------------ estimated creatinine clearance is 115.2 mL/min (by C-G formula based on Cr of 0.66). ------------------------------------------------------------------------------------------------------------------ No results for input(s): HGBA1C in the last 72 hours. ------------------------------------------------------------------------------------------------------------------ No results for input(s): CHOL, HDL, LDLCALC, TRIG, CHOLHDL, LDLDIRECT in the last 72  hours. ------------------------------------------------------------------------------------------------------------------ No results for input(s): TSH, T4TOTAL, T3FREE, THYROIDAB in the last 72 hours.  Invalid input(s): FREET3 ------------------------------------------------------------------------------------------------------------------ No results for input(s): VITAMINB12, FOLATE, FERRITIN, TIBC, IRON, RETICCTPCT in the last 72 hours.  Coagulation profile No results for input(s): INR, PROTIME in the last 168 hours.  No results for input(s): DDIMER in the last 72 hours.  Cardiac Enzymes No results for input(s): CKMB, TROPONINI, MYOGLOBIN in the last 168 hours.  Invalid input(s): CK ------------------------------------------------------------------------------------------------------------------ Invalid input(s): POCBNP  No results for input(s): GLUCAP in the last 72 hours.   Bani Gianfrancesco M.D. [facility] 11/22/2014, 12:57 PM  Pager: [phone] Between 7am to 7pm - call Pager - [phone]  After 7pm go to [email or web address] - password [email or web address]  Call night coverage person covering after 7pm

## 2014-11-23 LAB — CBC
HCT: 39 % (ref 36.0–46.0)
Hemoglobin: 12.4 g/dL (ref 12.0–15.0)
MCH: 23.3 pg — ABNORMAL LOW (ref 26.0–34.0)
MCHC: 31.8 g/dL (ref 30.0–36.0)
MCV: 73.3 fL — ABNORMAL LOW (ref 78.0–100.0)
Platelets: 249 10*3/uL (ref 150–400)
RBC: 5.32 MIL/uL — ABNORMAL HIGH (ref 3.87–5.11)
RDW: 25.8 % — ABNORMAL HIGH (ref 11.5–15.5)
WBC: 5.9 10*3/uL (ref 4.0–10.5)

## 2014-11-23 LAB — HEPATITIS B SURFACE ANTIGEN: Hepatitis B Surface Ag: NEGATIVE

## 2014-11-23 MED ORDER — SORBITOL 70 % SOLN
20.0000 mL | Freq: Every day | Status: DC
  Filled 2014-11-23: qty 30

## 2014-11-23 NOTE — Progress Notes (Signed)
[facility]                                                                               24 y.o. 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  Victoria Alvarez has been hospitalized 2x recently for similar symptoms.  She was feeling better after discharge 8/27 but still having cramping pain that was relieved with bowel movements.   9/4 she began having continuous abdominal pain not relieved with bowel movements. The pain is located thru out her abdomen. It stops her from eating. In the ER her lactic acid is 2.88, wbc is 11.6, CT abdomen/pelvis shows: circumferential wall thickening in the distal ileum. She was seen by GI and underwent colonoscopy with distal ileal stricture   Assessment & Plan     Abdominal Pain secondary to Enteritis - CT abdomen showed enteritis involving the distal ileum with adjacent inflammation DDx inflammatory, infectious or Crohn's disease.  ESR/CRP wnl. - on Flagyl (allergy to Cipro).Patient started on prednisone by GI incase the ileal stricture represents IBD, no significant improvement in her symptoms per patient  - Colonoscopy 9/9 showed tight but not complete distal ileum stricture that appears ulcerated, inflamed with distal and about 10-15 cm proximal to the IC valve, biopsies appear to be showing active inflammation - Continue Bentyl, pain control, Flagyl, solu-medrol 60 daily  Lactic Acidosis - Likely due to acute process currently going on in her bowel. Resolved with 2L IVF.  Left Portal VeinThrombosis. - Diagnosed on 10/11/14, cont Eliquis. 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 Right sided weakness has resolved, no residual neurologic deficit per patient.  Seizure - Stable, continue Keppra  500 bid  Chronic Pain / Hx of interstitial cystitis Continue 85 mg methadone daily. -seen in [place] in the past by a dr. Aldona Bar for this -apparently stable  Tobacco Abuse Nicotine Patch  History of depression Appears stable, denies any suicidal/homicidal ideation. Continue Prozac 20 daily.  TSH 4.3, free T4 0.9   Questionable hypercoagulable state; Hx of miscarriage, stroke, multiple thrombosis.  On Eliquis, was scheduled to see Hematology in August. -has been worked up for paroxysmal NH, myeloprolif disorder on admission 10/2014 by Dr. Minda Ditto was neg -Protein S was diminished as well that admission -Cardiolipin IGm was ?  - Unfortunately  missed her appt, hematology to see on d/c  Mild leukopenia: unclear significance, resolved.  Code Status: full code   Family Communication: Discussed in detail with the patient, all imaging results, lab results explained to the patient    Disposition Plan: not medically ready  Time Spent in minutes  25 minutes  Procedures  colonoscopy  Consults   GI   DVT Prophylaxis eliquis  Medications  Scheduled Meds: . apixaban  5 mg Oral BID  . FLUoxetine  20 mg Oral Daily  . levETIRAcetam  500 mg Oral BID  . methadone  85 mg Oral Daily  . methylPREDNISolone (SOLU-MEDROL) injection  60 mg Intravenous Daily  .  nicotine  21 mg Transdermal Daily  . sorbitol  20 mL Oral Daily   Continuous Infusions:  PRN Meds:.acetaminophen **OR** [DISCONTINUED] acetaminophen, Influenza vac split quadrivalent PF, ondansetron **OR** ondansetron (ZOFRAN) IV, oxyCODONE-acetaminophen   Antibiotics   Anti-infectives    Start     Dose/Rate Route Frequency Ordered Stop   11/18/14 1400  metroNIDAZOLE (FLAGYL) tablet 500 mg  Status:  Discontinued     500 mg Oral 3 times per day 11/18/14 0939 11/22/14 1617   11/14/14 1530  metroNIDAZOLE (FLAGYL) IVPB 500 mg  Status:  Discontinued     500 mg 100 mL/hr over 60 Minutes Intravenous Every 8 hours 11/14/14  1429 11/18/14 0939        Subjective:   Still nausea no cp No vomit Passing gas  c/o constipation no cp  Objective:   Blood pressure 122/61, pulse 61, temperature 98.7 F (37.1 C), temperature source Oral, resp. rate 18, height _0  (1.6 m), weight 91.173 kg (201 lb), SpO2 95 %, not currently breastfeeding.  Wt Readings from Last 3 Encounters:  11/14/14 91.173 kg (201 lb)  11/06/14 91 kg (200 lb 9.9 oz)  10/07/14 77.111 kg (170 lb)     Intake/Output Summary (Last 24 hours) at 11/23/14 1126 Last data filed at 11/23/14 0600  Gross per 24 hour  Intake    444 ml  Output      0 ml  Net    444 ml    Exam  General: Alert and oriented x 3, NAD  HEENT:  PERRLA, EOMI,  Neck: Supple  CVS: S1 S2 clear, RRR  Respiratory: Clear to auscultation bilaterally  Abdomen: Soft, diffuse tenderness, nondistended, + bowel sounds  Ext: no cyanosis clubbing or edema  Neuro: no new deficits  Skin: No rashes  Psych: alert and oriented x3    Data Review   Micro Results No results found for this or any previous visit (from the past 240 hour(s)).  Radiology Reports Dg Abd 1 View  11/15/2014   CLINICAL DATA:  Lower abdominal pain  EXAM: ABDOMEN - 1 VIEW  COMPARISON:  Abdominal radiographs October 14, 2014; CT abdomen and pelvis November 14, 2014  FINDINGS: There is moderate stool in the colon. There is no bowel dilatation or air-fluid level suggesting obstruction. No free air is seen on this supine examination. There are surgical clips in the left pelvic region.  IMPRESSION: Bowel gas pattern overall unremarkable.   Electronically Signed   By: Lowella Grip III M.D.   On: 11/15/2014 12:45   Ct Abdomen Pelvis W Contrast  11/14/2014   CLINICAL DATA:  24 year old female with acute abdominal and pelvic pain with vomiting for 1 day.  EXAM: CT ABDOMEN AND PELVIS WITH CONTRAST  TECHNIQUE: Multidetector CT imaging of the abdomen and pelvis was performed using the standard protocol  following bolus administration of intravenous contrast.  CONTRAST:  188m OMNIPAQUE IOHEXOL 300 MG/ML  SOLN  COMPARISON:  11/03/2014 and prior CTs  FINDINGS: Lower chest:  Minimal basilar atelectasis noted.  Hepatobiliary: No significant hepatic or gallbladder abnormalities. There is no evidence of biliary dilatation.  Pancreas: Unremarkable  Spleen: Unremarkable  Adrenals/Urinary Tract: Kidneys, adrenal glands and bladder are unremarkable.  Stomach/Bowel: There is circumferential wall thickening of a moderate length segment distal ileum with adjacent inflammation and small amount of interloop fluid. There is mild distention of small bowel loops just proximal to the moderate segment wall thickening. There is no evidence of abscess or pneumoperitoneum. The appendix is normal.  Vascular/Lymphatic: Prominent mesenteric lymph nodes are likely reactive. There is no evidence of vascular abnormality.  Reproductive: Uterus and adnexal regions are unremarkable.  Other: A trace amount of free pelvic fluid is noted.  Musculoskeletal: No acute or suspicious abnormalities identified.  IMPRESSION: Enteritis involving the distal ileum with adjacent inflammation and small amount of fluid. This likely is inflammatory, infectious or Crohn's disease. No evidence of pneumoperitoneum or abscess.   Electronically Signed   By: Margarette Canada M.D.   On: 11/14/2014 10:28   Ct Abdomen Pelvis W Contrast  11/02/2014   CLINICAL DATA:  Abdominal pain since leaving the hospital a few weeks ago. 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  EXAM: CT ABDOMEN AND PELVIS WITH CONTRAST  TECHNIQUE: Multidetector CT imaging of the abdomen and pelvis was performed using the standard protocol following bolus administration of intravenous contrast.  CONTRAST:  165m OMNIPAQUE IOHEXOL 300 MG/ML  SOLN  COMPARISON:  10/11/2014  FINDINGS: 3 mm nodule in the left lung base without change since prior study.  Small focal fatty infiltration in the liver  adjacent to the falciform ligament. Tiny sub cm low-attenuation lesion in the medial segment left lobe unchanged since prior study. This too small to characterize but probably represents a cyst or hemangioma. No other focal liver lesions. Gallbladder, spleen, pancreas, adrenal glands, kidneys, abdominal aorta, inferior vena cava, and retroperitoneal lymph nodes are unremarkable. Stomach, small bowel, and colon are not abnormally distended. Residual thickening of the wall of the distal ileum. This could represent residua of previous vascular compromise or inflammatory change. Visualized portions of the superior mesenteric artery and vein appear patent. No significant residual portal venous thrombosis. Small amount of free fluid in the lower abdominal mesenteric in the area of thickened bowel. No pneumatosis or portal venous gas. No free air. Mild prominence of mesenteric lymph nodes, probably reactive.  Pelvis: Small amount of free fluid in the pelvis could be reactive or physiologic. Uterus and ovaries are not enlarged. Mild bladder wall thickening may be due to under distention or cystitis. Appendix is normal. No pelvic mass or lymphadenopathy. No destructive bone lesions.  IMPRESSION: Residual bowel wall thickening in the low abdomen with small amount of adjacent free fluid. This could represent residual vascular compromise or inflammatory process. The previous portal venous thrombosis has resolved.   Electronically Signed   By: WLucienne CapersM.D.   On: 11/02/2014 23:59   Dg Abd Portable 1v  11/22/2014   CLINICAL DATA:  Right lower quadrant pain with associated mild generalized abdominal pain.  EXAM: PORTABLE ABDOMEN - 1 VIEW  COMPARISON:  11/17/2014  FINDINGS: The bowel gas pattern is nonobstructive. There is a long tubular radiolucent structure which seen to arise from the cecum, with associated surrounding soft tissue thickening.  No radio-opaque calculi or other significant radiographic abnormality are  seen. Moderate amount of stool throughout the colon. No radiographic evidence of organomegaly. Osseous structures are grossly normal.  IMPRESSION: Nonobstructive bowel gas pattern with evidence of constipation.  Long tubular structure which seems to arise from wall are overlie the distal cecum. This may represent a gas containing thickened distal ileum, or abnormal appendix. In correlation to CT of the abdomen dated 11/14/2014, there is no evidence of appendicitis, and the distal ileum appears inflamed. Therefore, this radiographic finding likely correlates to the segment of abnormal ileum. Radiographic or CT follow-up is recommended.  These results will be called to the ordering clinician or representative by the Radiologist Assistant, and communication documented in  the PACS or zVision Dashboard.   Electronically Signed   By: Fidela Salisbury M.D.   On: 11/22/2014 12:15   Ct Angio Abd/pel W/ And/or W/o  11/03/2014   CLINICAL DATA:  Severe left lower quadrant pain, and nausea. Patient with portal venous and cerebral venous thrombosis.  EXAM: CT ANGIOGRAPHY ABDOMEN AND PELVIS WITH CONTRAST AND WITHOUT CONTRAST  TECHNIQUE: Multidetector CT imaging of the abdomen and pelvis was performed using the standard protocol during bolus administration of intravenous contrast. Multiplanar reconstructed images including MIPs were obtained and reviewed to evaluate the vascular anatomy.  CONTRAST:  100 cc Omnipaque 350.  COMPARISON:  CT of the abdomen pelvis dated 11/02/2014  FINDINGS: Arterial findings  Aorta  Normal in caliber, without filling defects or stenosis.  Celiac axis  Normal in caliber, without filling defects or stenosis.  Superior mesenteric  Normal in caliber, without filling defects or stenosis.  Left renal  Normal in caliber, without filling defects or stenosis. Accessory left renal artery is noted.  Right renal  Normal in caliber, without filling defects or stenosis.  Inferior mesenteric  Normal in caliber,  without filling defects or stenosis.  Left iliac  Normal in caliber, without filling defects or stenosis.  Right iliac  Normal in caliber, without filling defects or stenosis.  Venous findings  No evidence of portal venous thrombosis.  Review of the MIP images confirms the above findings.  Nonvascular findings  There is mesenteric stranding and small amount of free fluid within the mid left abdomen. There is also a long segment of bowel thickening of the distal third of the ileum. The distal most ileum is noted to be distended and fluid-filled with maximum diameter of 2.7 cm. The terminal ileum is prominent. The appendix is normal. The remainder of the upper GI tract and colon are normal. Multiple sub cm rounded mesenteric lymph nodes are seen within the same area of the abdomen. There is a tiny amount of free fluid, tracking along the left pericolic gutter. There is a small amount of free fluid within the pelvis as well.  Focal fatty infiltration along the falciform ligament of the liver is noted, otherwise the liver appears normal. The spleen, pancreas, bilateral adrenal glands, bilateral kidneys are normal. Vicarious excretion of contrast within the otherwise normal gallbladder is noted.  IMPRESSION: No evidence of a arterial or venous thrombosis.  Increased mesenteric stranding with interspersed shotty mesenteric lymph nodes within the mid left abdomen, in the area of long segment of thickening of the distal ileum. Thickening of the terminal ileum and a focal distention of up to 2.7 cm of the distal ileum leading to it. These findings may represent infectious, inflammatory or ischemic bowel changes, with associated reactive lymphadenopathy. Alternatively mesenteric adenitis is of consideration.   Electronically Signed   By: Fidela Salisbury M.D.   On: 11/03/2014 11:42    CBC  Recent Labs Lab 11/16/14 1607 11/18/14 0621 11/19/14 0356 11/20/14 1230 11/23/14 0340  WBC 4.3 3.8* 5.3 5.7 5.9  HGB 12.1  10.6* 11.2* 11.7* 12.4  HCT 38.3 34.3* 35.2* 37.3 39.0  PLT 195 176 223 237 249  MCV 73.7* 73.1* 73.0* 73.7* 73.3*  MCH 23.3* 22.6* 23.2* 23.1* 23.3*  MCHC 31.6 30.9 31.8 31.4 31.8  RDW 26.6* 26.4* 25.7* 26.3* 25.8*  LYMPHSABS 2.0  --  0.6*  --   --   MONOABS 0.3  --  0.2  --   --   EOSABS 0.1  --  0.0  --   --  BASOSABS 0.0  --  0.0  --   --     Chemistries   Recent Labs Lab 11/17/14 1010 11/18/14 0621 11/19/14 0356 11/20/14 0402  NA 140 138 138 139  K 3.9 3.8 4.0 4.6  CL 111 107 107 108  CO2 _0 18*  GLUCOSE 99 95 148* 105*  BUN <5* <5* <5* 9  CREATININE 0.84 0.86 0.90 0.66  CALCIUM 8.2* 8.2* 9.0 9.3  MG 1.6* 1.9 1.6*  --   AST  --   --  19  --   ALT  --   --  19  --   ALKPHOS  --   --  46  --   BILITOT  --   --  0.7  --    ------------------------------------------------------------------------------------------------------------------ estimated creatinine clearance is 115.2 mL/min (by C-G formula based on Cr of 0.66). ------------------------------------------------------------------------------------------------------------------ No results for input(s): HGBA1C in the last 72 hours. ------------------------------------------------------------------------------------------------------------------ No results for input(s): CHOL, HDL, LDLCALC, TRIG, CHOLHDL, LDLDIRECT in the last 72 hours. ------------------------------------------------------------------------------------------------------------------ No results for input(s): TSH, T4TOTAL, T3FREE, THYROIDAB in the last 72 hours.  Invalid input(s): FREET3 ------------------------------------------------------------------------------------------------------------------ No results for input(s): VITAMINB12, FOLATE, FERRITIN, TIBC, IRON, RETICCTPCT in the last 72 hours.  Coagulation profile No results for input(s): INR, PROTIME in the last 168 hours.  No results for input(s): DDIMER in the last 72  hours.  Cardiac Enzymes No results for input(s): CKMB, TROPONINI, MYOGLOBIN in the last 168 hours.  Invalid input(s): CK ------------------------------------------------------------------------------------------------------------------ Invalid input(s): POCBNP  No results for input(s): GLUCAP in the last 72 hours.  Verneita Griffes, MD [facility] [phone]

## 2014-11-23 NOTE — Progress Notes (Signed)
Patient ID: Victoria Alvarez, female   DOB: 1990-04-28, 24 y.o.   MRN: [id number] 5 Days Post-Op  Subjective: This patient is known to our service from several weeks ago.  She was admitted with what appeared to be terminal ileitis and abdominal pain.  She was also noted to have a PV thrombus as well and started on eliquis.  She also had a post partum CVA in march of this year as well, with no residual side effects.  She was sent home but has since returned several times due to pain.  She was admitted this admission several days ago.  GI was called as her segment of wall thickening on her CT scan had expanded.  She underwent a colonoscopy on 9-9 which revealed a stricture 10-15cm from ICV and some areas of ulcerations.  bx were taken and are leaning towards crohn's disease.  She was prophylactically started on 20mg  of prednisone orally BID.  She had her diet advanced.  She is on full liquids right now.  She admits to some indigestion, but is still passing flatus.   She states she hasn't had a BM since around the time of her colonoscopy.  She had a plain film yesterday that shows a nonobstructive bowel gas patter, but you can see the area of stricturing on this film.  Her biggest complaint remains pain.  She was transitioned yesterday to 60 mg of IV steroids and has only received one dose so far.  We have been asked to see the patient to evaluate whether she needs surgical management.  Objective: Vital signs in last 24 hours: Temp:  [98.7 F (37.1 C)-99.7 F (37.6 C)] 98.7 F (37.1 C) (09/14 0454) Pulse Rate:  [60-61] 61 (09/14 0454) Resp:  [18-19] 18 (09/14 0454) BP: (113-122)/(55-61) 122/61 mmHg (09/14 0454) SpO2:  [95 %-99 %] 95 % (09/14 0454) Last BM Date: 11/19/14  Intake/Output from previous day: 09/13 0701 - 09/14 0700 In: 444 [P.O.:444] Out: -  Intake/Output this shift:    PE: Gen: obese, white female, NAD Heart: regular rate and rhythm, no M,G,R Lungs: CTAB Abd: soft, tender in RLQ,  but does not guard or have rebound, +BS, obese, nondistended  Lab Results:   Recent Labs  11/23/14 0340  WBC 5.9  HGB 12.4  HCT 39.0  PLT 249   BMET No results for input(s): NA, K, CL, CO2, GLUCOSE, BUN, CREATININE, CALCIUM in the last 72 hours. PT/INR No results for input(s): LABPROT, INR in the last 72 hours. CMP     Component Value Date/Time   NA 139 11/20/2014 0402   K 4.6 11/20/2014 0402   CL 108 11/20/2014 0402   CO2 18* 11/20/2014 0402   GLUCOSE 105* 11/20/2014 0402   BUN 9 11/20/2014 0402   CREATININE 0.66 11/20/2014 0402   CREATININE 0.81 07/12/2014 1717   CALCIUM 9.3 11/20/2014 0402   PROT 5.0* 11/19/2014 0356   ALBUMIN 2.9* 11/19/2014 0356   AST 19 11/19/2014 0356   ALT 19 11/19/2014 0356   ALKPHOS 46 11/19/2014 0356   BILITOT 0.7 11/19/2014 0356   GFRNONAA >60 11/20/2014 0402   GFRNONAA >89 07/12/2014 1717   GFRAA >60 11/20/2014 0402   GFRAA >89 07/12/2014 1717   Lipase     Component Value Date/Time   LIPASE 30 11/14/2014 0726       Studies/Results: Dg Abd Portable 1v  11/22/2014   CLINICAL DATA:  Right lower quadrant pain with associated mild generalized abdominal pain.  EXAM: PORTABLE ABDOMEN -  1 VIEW  COMPARISON:  11/17/2014  FINDINGS: The bowel gas pattern is nonobstructive. There is a long tubular radiolucent structure which seen to arise from the cecum, with associated surrounding soft tissue thickening.  No radio-opaque calculi or other significant radiographic abnormality are seen. Moderate amount of stool throughout the colon. No radiographic evidence of organomegaly. Osseous structures are grossly normal.  IMPRESSION: Nonobstructive bowel gas pattern with evidence of constipation.  Long tubular structure which seems to arise from wall are overlie the distal cecum. This may represent a gas containing thickened distal ileum, or abnormal appendix. In correlation to CT of the abdomen dated 11/14/2014, there is no evidence of appendicitis, and the  distal ileum appears inflamed. Therefore, this radiographic finding likely correlates to the segment of abnormal ileum. Radiographic or CT follow-up is recommended.  These results will be called to the ordering clinician or representative by the Radiologist Assistant, and communication documented in the PACS or zVision Dashboard.   Electronically Signed   By: Ted Mcalpine M.D.   On: 11/22/2014 12:15    Anti-infectives: Anti-infectives    Start     Dose/Rate Route Frequency Ordered Stop   11/18/14 1400  metroNIDAZOLE (FLAGYL) tablet 500 mg  Status:  Discontinued     500 mg Oral 3 times per day 11/18/14 0939 11/22/14 1617   11/14/14 1530  metroNIDAZOLE (FLAGYL) IVPB 500 mg  Status:  Discontinued     500 mg 100 mL/hr over 60 Minutes Intravenous Every 8 hours 11/14/14 1429 11/18/14 0939       Assessment/Plan  1. Abdominal pain, likely secondary to crohn's disease -The patient has a stricture of her TI with a likely diagnosis of crohn's disease.  She does have a nonobstructive bowel gas pattern on her plain films and does not appear distended right now.  She is still passing flatus.  Generally, we would like for the patient to have maximal medical management before considering an operation, unless they are obstructed or perforated which she currently isn't.  Pain is not usually the symptoms that leads to an operation.  She has been on 4-5 days of steroids, and yesterday was only her first dose of IV steroids.   -she also has had a CVA this year as well as a PV thrombus for which she is taking eliquis.  She does not recall any type of hematologic work up to determine if she has some genetic disposition to clotting.  All of these things would put her at higher risk for an operation right now as well because her eliquis would have to be held. -Dr. Derrell Lolling will evaluate the patient as well and make further recommendations as needed. -thank you for this consultation.   LOS: 9 days     Jacora Hopkins E 11/23/2014, 1:03 PM Pager: [phone]

## 2014-11-23 NOTE — Progress Notes (Signed)
Progress Note   Subjective  still having diffuse lower abdominal cramps. Pain constant, worse with meals. Constipated, last BM prior to colonoscopy.    Objective   Vital signs in last 24 hours: Temp:  [98.7 F (37.1 C)-99.7 F (37.6 C)] 98.7 F (37.1 C) (09/14 0454) Pulse Rate:  [60-61] 61 (09/14 0454) Resp:  [18-19] 18 (09/14 0454) BP: (113-122)/(55-61) 122/61 mmHg (09/14 0454) SpO2:  [95 %-99 %] 95 % (09/14 0454) Last BM Date: 11/19/14 General:    Pleasant white female in NAD Heart:  Regular rate and rhythm Abdomen:  Soft, nondistended, mild diffuse lower abdominal tenderness.  Normal bowel sounds. Extremities:  Without edema. Neurologic:  Alert and oriented,  grossly normal neurologically. Psych:  Cooperative. Normal mood and affect.    Lab Results:  Recent Labs  11/20/14 1230 11/23/14 0340  WBC 5.7 5.9  HGB 11.7* 12.4  HCT 37.3 39.0  PLT 237 249    Studies/Results: Dg Abd Portable 1v  11/22/2014   CLINICAL DATA:  Right lower quadrant pain with associated mild generalized abdominal pain.  EXAM: PORTABLE ABDOMEN - 1 VIEW  COMPARISON:  11/17/2014  FINDINGS: The bowel gas pattern is nonobstructive. There is a long tubular radiolucent structure which seen to arise from the cecum, with associated surrounding soft tissue thickening.  No radio-opaque calculi or other significant radiographic abnormality are seen. Moderate amount of stool throughout the colon. No radiographic evidence of organomegaly. Osseous structures are grossly normal.  IMPRESSION: Nonobstructive bowel gas pattern with evidence of constipation.  Long tubular structure which seems to arise from wall are overlie the distal cecum. This may represent a gas containing thickened distal ileum, or abnormal appendix. In correlation to CT of the abdomen dated 11/14/2014, there is no evidence of appendicitis, and the distal ileum appears inflamed. Therefore, this radiographic finding likely correlates to the segment  of abnormal ileum. Radiographic or CT follow-up is recommended.  These results will be called to the ordering clinician or representative by the Radiologist Assistant, and communication documented in the PACS or zVision Dashboard.   Electronically Signed   By: Ted Mcalpine M.D.   On: 11/22/2014 12:15    Colonoscopy  ENDOSCOPIC IMPRESSION: The colon mucosa was normal throughout. The most terminal ileum was normal. 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 The most distal aspect was biopsied extensively. The examination was otherwise normal        Assessment / Plan:    33. 24 year old female with distal ileitis / distal ileal stricture. Path c/w chronic active ileitis. She hasn't been taking NSAIDS. Ileitis secondary to ischemia related to recent vascular thrombosis? Looks like Crohn's disease is high on list of possibilities now. Unfortunately she hasn't responded to steroids. We spoke with Pathology yesterday and they will be reviewing slides again today. In the meantime will ask for surgical evaluation given ileal stricture. Preparations being made for Biologics in event this is Crohn's  2. Constipation. Trial of enema now. KUB yesterday reveals moderate stool in colon on KUB yesterday.   3. Recent PVT, on Eliquis    LOS: 9 days   Victoria Alvarez  11/23/2014, 10:44 AM   Attending Addendum:  I agree with the Advanced Practitioner's note and impression. I have discussed her pathology results again with pathology and we have had surgery see her. Given her history, pathology thinks it is possible chronic ischemic changes  could present like this. While Crohns remains on the differential, I am not 100% certain she has it. Given she has not responded at all to steroids, normal inflammatory markers, relatively short segment of bowel, surgery I think is a reasonable up front approach and to  confirm the diagnosis given it remains in question. I will speak with surgery about her case. Otherwise continue IV Steroids for now. If she is no better tomorrow and surgery does not wish to operate, we will consider Remicade in the near future. The patient is extremely anxious about this issue, and wants to avoid immunosuppressive medications if possible. Will reassess her tomorrow following discussion with surgery.   [name], MD [facility] Pager [phone]

## 2014-11-24 ENCOUNTER — Inpatient Hospital Stay (HOSPITAL_COMMUNITY): Payer: Medicaid Other

## 2014-11-24 ENCOUNTER — Encounter (HOSPITAL_COMMUNITY): Payer: Self-pay | Admitting: Radiology

## 2014-11-24 LAB — COMPREHENSIVE METABOLIC PANEL
ALT: 24 U/L (ref 14–54)
AST: 25 U/L (ref 15–41)
Albumin: 3.4 g/dL — ABNORMAL LOW (ref 3.5–5.0)
Alkaline Phosphatase: 47 U/L (ref 38–126)
Anion gap: 8 (ref 5–15)
BUN: 10 mg/dL (ref 6–20)
CO2: 26 mmol/L (ref 22–32)
Calcium: 9 mg/dL (ref 8.9–10.3)
Chloride: 103 mmol/L (ref 101–111)
Creatinine, Ser: 0.86 mg/dL (ref 0.44–1.00)
GFR calc Af Amer: >60 mL/min (ref >60)
GFR calc non Af Amer: >60 mL/min (ref >60)
Glucose, Bld: 123 mg/dL — ABNORMAL HIGH (ref 65–99)
Potassium: 3.2 mmol/L — ABNORMAL LOW (ref 3.5–5.1)
Sodium: 137 mmol/L (ref 135–145)
Total Bilirubin: 0.5 mg/dL (ref 0.3–1.2)
Total Protein: 5 g/dL — ABNORMAL LOW (ref 6.5–8.1)

## 2014-11-24 LAB — CBC
HCT: 36.3 % (ref 36.0–46.0)
Hemoglobin: 11.6 g/dL — ABNORMAL LOW (ref 12.0–15.0)
MCH: 23.6 pg — ABNORMAL LOW (ref 26.0–34.0)
MCHC: 32 g/dL (ref 30.0–36.0)
MCV: 73.9 fL — ABNORMAL LOW (ref 78.0–100.0)
Platelets: 249 10*3/uL (ref 150–400)
RBC: 4.91 MIL/uL (ref 3.87–5.11)
RDW: 25.9 % — ABNORMAL HIGH (ref 11.5–15.5)
WBC: 7.8 10*3/uL (ref 4.0–10.5)

## 2014-11-24 LAB — PROTIME-INR
INR: 1.18 (ref 0.00–1.49)
Prothrombin Time: 15.2 seconds (ref 11.6–15.2)

## 2014-11-24 MED ORDER — ALUM & MAG HYDROXIDE-SIMETH 200-200-20 MG/5ML PO SUSP
15.0000 mL | ORAL | Status: DC | PRN
  Administered 2014-11-24 – 2014-11-28 (×3): 15 mL via ORAL
  Filled 2014-11-24 (×3): qty 30

## 2014-11-24 MED ORDER — IOHEXOL 350 MG/ML SOLN
100.0000 mL | Freq: Once | INTRAVENOUS | Status: AC | PRN
  Administered 2014-11-24: 100 mL via INTRAVENOUS

## 2014-11-24 MED ORDER — FLEET ENEMA 7-19 GM/118ML RE ENEM
1.0000 | ENEMA | Freq: Every day | RECTAL | Status: DC | PRN
  Administered 2014-12-02 – 2014-12-03 (×2): 1 via RECTAL
  Filled 2014-11-24 (×2): qty 1

## 2014-11-24 NOTE — Progress Notes (Signed)
[facility]                                                                               24 y.o. 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  [name] has been hospitalized 2x recently for similar symptoms.  She was feeling better after discharge 8/27 but still having cramping pain that was relieved with bowel movements.   9/4 she began having continuous abdominal pain not relieved with bowel movements. The pain is located thru out her abdomen. It stops her from eating. In the ER her lactic acid is 2.88, wbc is 11.6, CT abdomen/pelvis shows: circumferential wall thickening in the distal ileum. She was seen by GI and underwent colonoscopy with distal ileal stricture ANA was done 9/15   Assessment & Plan     Abdominal Pain secondary to Enteritis - CT abdomen showed enteritis involving the distal ileum with adjacent inflammation DDx inflammatory, infectious or Crohn's disease.  ESR/CRP wnl. - on Flagyl (allergy to Cipro).Patient started on prednisone by GI in case the ileal stricture represents IBD, no significant improvement in symptoms yet - Colonoscopy 9/9 showed tight but not complete distal ileum stricture that appears ulcerated, inflamed with distal and about 10-15 cm proximal to the IC valve, biopsies appear to be showing active inflammation -Gen surgery consulted 9/14 and non-operative management advised as recent diagnosis Portal vein thrombosis 1 mo ago so risky to take off of AC for procedures otherwise - Continue Bentyl, pain control, Flagyl, solu-medrol 60 daily  Lactic Acidosis - Likely due to acute process currently going on in her bowel. Resolved with 2L IVF.  Left Portal VeinThrombosis. - Diagnosed on 10/11/14, cont Eliquis. -patient claims compliance  with meds -She is not a great candidate for surgery currently with poor PO intake and likely poor protein reserve and it doesn't appear she is obstructed right now as she had a stool 9/14  Cerebral Sinus Venous Thrombosis On Eliquis. Right sided weakness has resolved, no residual neurologic deficit per patient.  Seizure - Stable, continue Keppra 500 bid  Chronic Pain / Hx of interstitial cystitis Continue 85 mg methadone daily. -seen in [place] in the past by a Dr. Aldona Bar for this -apparently stable  Tobacco Abuse Nicotine Patch  History of depression Appears stable, denies any suicidal/homicidal ideation. Continue Prozac 20 daily.  TSH 4.3, free T4 0.9   Questionable hypercoagulable state; Hx of miscarriage, stroke, multiple thrombosis.  -Sucpsicion raised as patient has relative-MOther-with Lupus -Also has cousin with Crohn's Given non-response to Setroids, likely would be beneficial to determine other etiologies such as Lupus enteritis which although Rare might be a unifying diagnosis-I have ordered an ANA -has been worked up for paroxysmal NH, myeloprolif disorder on admission 10/2014 by Dr. Minda Ditto was neg -Protein S was diminished as well that admission -Cardiolipin IGm was ?  - Unfortunately  missed her appt, hematology to see on d/c  Mild leukopenia: unclear significance, resolved.  Code Status: full code   Family  Communication: Discussed in detail with the patient, all imaging results, lab results explained to the patient    Disposition Plan: not medically ready  Time Spent in minutes  25 minutes  Procedures  colonoscopy  Consults   GI   DVT Prophylaxis eliquis  Medications  Scheduled Meds: . apixaban  5 mg Oral BID  . FLUoxetine  20 mg Oral Daily  . levETIRAcetam  500 mg Oral BID  . methadone  85 mg Oral Daily  . methylPREDNISolone (SOLU-MEDROL) injection  60 mg Intravenous Daily  . nicotine  21 mg Transdermal Daily   Continuous Infusions:    PRN Meds:.acetaminophen **OR** [DISCONTINUED] acetaminophen, Influenza vac split quadrivalent PF, ondansetron **OR** ondansetron (ZOFRAN) IV, oxyCODONE-acetaminophen, sodium phosphate   Antibiotics   Anti-infectives    Start     Dose/Rate Route Frequency Ordered Stop   11/18/14 1400  metroNIDAZOLE (FLAGYL) tablet 500 mg  Status:  Discontinued     500 mg Oral 3 times per day 11/18/14 0939 11/22/14 1617   11/14/14 1530  metroNIDAZOLE (FLAGYL) IVPB 500 mg  Status:  Discontinued     500 mg 100 mL/hr over 60 Minutes Intravenous Every 8 hours 11/14/14 1429 11/18/14 0939        Subjective:   Mild Nausea Requesting PO meds  Some pain still no fever no chills tol full diet only fairly stooled 9/14  Objective:   Blood pressure 130/73, pulse 47, temperature 98.3 F (36.8 C), temperature source Oral, resp. rate 18, height _0  (1.6 m), weight 91.173 kg (201 lb), SpO2 99 %, not currently breastfeeding.  Wt Readings from Last 3 Encounters:  11/14/14 91.173 kg (201 lb)  11/06/14 91 kg (200 lb 9.9 oz)  10/07/14 77.111 kg (170 lb)     Intake/Output Summary (Last 24 hours) at 11/24/14 1102 Last data filed at 11/23/14 1427  Gross per 24 hour  Intake    360 ml  Output      0 ml  Net    360 ml    Exam  General: Alert and oriented x 3, NAD  HEENT:  PERRLA, EOMI,  Neck: Supple  CVS: S1 S2 clear, RRR  Respiratory: Clear to auscultation bilaterally  Abdomen: Soft, diffuse tenderness, nondistended, + bowel sounds  Ext: no cyanosis clubbing or edema  Neuro: no new deficits  Skin: No rashes  Psych: alert and oriented x3    Data Review   Micro Results No results found for this or any previous visit (from the past 240 hour(s)).  Radiology Reports Dg Abd 1 View  11/15/2014   CLINICAL DATA:  Lower abdominal pain  EXAM: ABDOMEN - 1 VIEW  COMPARISON:  Abdominal radiographs October 14, 2014; CT abdomen and pelvis November 14, 2014  FINDINGS: There is moderate stool in the  colon. There is no bowel dilatation or air-fluid level suggesting obstruction. No free air is seen on this supine examination. There are surgical clips in the left pelvic region.  IMPRESSION: Bowel gas pattern overall unremarkable.   Electronically Signed   By: Lowella Grip III M.D.   On: 11/15/2014 12:45   Ct Abdomen Pelvis W Contrast  11/14/2014   CLINICAL DATA:  24 year old female with acute abdominal and pelvic pain with vomiting for 1 day.  EXAM: CT ABDOMEN AND PELVIS WITH CONTRAST  TECHNIQUE: Multidetector CT imaging of the abdomen and pelvis was performed using the standard protocol following bolus administration of intravenous contrast.  CONTRAST:  1101m OMNIPAQUE IOHEXOL 300 MG/ML  SOLN  COMPARISON:  11/03/2014 and prior CTs  FINDINGS: Lower chest:  Minimal basilar atelectasis noted.  Hepatobiliary: No significant hepatic or gallbladder abnormalities. There is no evidence of biliary dilatation.  Pancreas: Unremarkable  Spleen: Unremarkable  Adrenals/Urinary Tract: Kidneys, adrenal glands and bladder are unremarkable.  Stomach/Bowel: There is circumferential wall thickening of a moderate length segment distal ileum with adjacent inflammation and small amount of interloop fluid. There is mild distention of small bowel loops just proximal to the moderate segment wall thickening. There is no evidence of abscess or pneumoperitoneum. The appendix is normal.  Vascular/Lymphatic: Prominent mesenteric lymph nodes are likely reactive. There is no evidence of vascular abnormality.  Reproductive: Uterus and adnexal regions are unremarkable.  Other: A trace amount of free pelvic fluid is noted.  Musculoskeletal: No acute or suspicious abnormalities identified.  IMPRESSION: Enteritis involving the distal ileum with adjacent inflammation and small amount of fluid. This likely is inflammatory, infectious or Crohn's disease. No evidence of pneumoperitoneum or abscess.   Electronically Signed   By: Margarette Canada M.D.    On: 11/14/2014 10:28   Ct Abdomen Pelvis W Contrast  11/02/2014   CLINICAL DATA:  Abdominal pain since leaving the hospital a few weeks ago. 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  EXAM: CT ABDOMEN AND PELVIS WITH CONTRAST  TECHNIQUE: Multidetector CT imaging of the abdomen and pelvis was performed using the standard protocol following bolus administration of intravenous contrast.  CONTRAST:  134m OMNIPAQUE IOHEXOL 300 MG/ML  SOLN  COMPARISON:  10/11/2014  FINDINGS: 3 mm nodule in the left lung base without change since prior study.  Small focal fatty infiltration in the liver adjacent to the falciform ligament. Tiny sub cm low-attenuation lesion in the medial segment left lobe unchanged since prior study. This too small to characterize but probably represents a cyst or hemangioma. No other focal liver lesions. Gallbladder, spleen, pancreas, adrenal glands, kidneys, abdominal aorta, inferior vena cava, and retroperitoneal lymph nodes are unremarkable. Stomach, small bowel, and colon are not abnormally distended. Residual thickening of the wall of the distal ileum. This could represent residua of previous vascular compromise or inflammatory change. Visualized portions of the superior mesenteric artery and vein appear patent. No significant residual portal venous thrombosis. Small amount of free fluid in the lower abdominal mesenteric in the area of thickened bowel. No pneumatosis or portal venous gas. No free air. Mild prominence of mesenteric lymph nodes, probably reactive.  Pelvis: Small amount of free fluid in the pelvis could be reactive or physiologic. Uterus and ovaries are not enlarged. Mild bladder wall thickening may be due to under distention or cystitis. Appendix is normal. No pelvic mass or lymphadenopathy. No destructive bone lesions.  IMPRESSION: Residual bowel wall thickening in the low abdomen with small amount of adjacent free fluid. This could represent residual vascular compromise or  inflammatory process. The previous portal venous thrombosis has resolved.   Electronically Signed   By: WLucienne CapersM.D.   On: 11/02/2014 23:59   Dg Abd Portable 1v  11/22/2014   CLINICAL DATA:  Right lower quadrant pain with associated mild generalized abdominal pain.  EXAM: PORTABLE ABDOMEN - 1 VIEW  COMPARISON:  11/17/2014  FINDINGS: The bowel gas pattern is nonobstructive. There is a long tubular radiolucent structure which seen to arise from the cecum, with associated surrounding soft tissue thickening.  No radio-opaque calculi or other significant radiographic abnormality are seen. Moderate amount of stool throughout the colon. No radiographic evidence of organomegaly. Osseous structures are grossly normal.  IMPRESSION: Nonobstructive bowel gas pattern with evidence of constipation.  Long tubular structure which seems to arise from wall are overlie the distal cecum. This may represent a gas containing thickened distal ileum, or abnormal appendix. In correlation to CT of the abdomen dated 11/14/2014, there is no evidence of appendicitis, and the distal ileum appears inflamed. Therefore, this radiographic finding likely correlates to the segment of abnormal ileum. Radiographic or CT follow-up is recommended.  These results will be called to the ordering clinician or representative by the Radiologist Assistant, and communication documented in the PACS or zVision Dashboard.   Electronically Signed   By: Fidela Salisbury M.D.   On: 11/22/2014 12:15   Ct Angio Abd/pel W/ And/or W/o  11/03/2014   CLINICAL DATA:  Severe left lower quadrant pain, and nausea. Patient with portal venous and cerebral venous thrombosis.  EXAM: CT ANGIOGRAPHY ABDOMEN AND PELVIS WITH CONTRAST AND WITHOUT CONTRAST  TECHNIQUE: Multidetector CT imaging of the abdomen and pelvis was performed using the standard protocol during bolus administration of intravenous contrast. Multiplanar reconstructed images including MIPs were  obtained and reviewed to evaluate the vascular anatomy.  CONTRAST:  100 cc Omnipaque 350.  COMPARISON:  CT of the abdomen pelvis dated 11/02/2014  FINDINGS: Arterial findings  Aorta  Normal in caliber, without filling defects or stenosis.  Celiac axis  Normal in caliber, without filling defects or stenosis.  Superior mesenteric  Normal in caliber, without filling defects or stenosis.  Left renal  Normal in caliber, without filling defects or stenosis. Accessory left renal artery is noted.  Right renal  Normal in caliber, without filling defects or stenosis.  Inferior mesenteric  Normal in caliber, without filling defects or stenosis.  Left iliac  Normal in caliber, without filling defects or stenosis.  Right iliac  Normal in caliber, without filling defects or stenosis.  Venous findings  No evidence of portal venous thrombosis.  Review of the MIP images confirms the above findings.  Nonvascular findings  There is mesenteric stranding and small amount of free fluid within the mid left abdomen. There is also a long segment of bowel thickening of the distal third of the ileum. The distal most ileum is noted to be distended and fluid-filled with maximum diameter of 2.7 cm. The terminal ileum is prominent. The appendix is normal. The remainder of the upper GI tract and colon are normal. Multiple sub cm rounded mesenteric lymph nodes are seen within the same area of the abdomen. There is a tiny amount of free fluid, tracking along the left pericolic gutter. There is a small amount of free fluid within the pelvis as well.  Focal fatty infiltration along the falciform ligament of the liver is noted, otherwise the liver appears normal. The spleen, pancreas, bilateral adrenal glands, bilateral kidneys are normal. Vicarious excretion of contrast within the otherwise normal gallbladder is noted.  IMPRESSION: No evidence of a arterial or venous thrombosis.  Increased mesenteric stranding with interspersed shotty mesenteric lymph  nodes within the mid left abdomen, in the area of long segment of thickening of the distal ileum. Thickening of the terminal ileum and a focal distention of up to 2.7 cm of the distal ileum leading to it. These findings may represent infectious, inflammatory or ischemic bowel changes, with associated reactive lymphadenopathy. Alternatively mesenteric adenitis is of consideration.   Electronically Signed   By: Fidela Salisbury M.D.   On: 11/03/2014 11:42    CBC  Recent Labs Lab 11/18/14 0349 11/19/14 0356 11/20/14 1230  11/23/14 0340 11/24/14 0420  WBC 3.8* 5.3 5.7 5.9 7.8  HGB 10.6* 11.2* 11.7* 12.4 11.6*  HCT 34.3* 35.2* 37.3 39.0 36.3  PLT 176 223 237 249 249  MCV 73.1* 73.0* 73.7* 73.3* 73.9*  MCH 22.6* 23.2* 23.1* 23.3* 23.6*  MCHC 30.9 31.8 31.4 31.8 32.0  RDW 26.4* 25.7* 26.3* 25.8* 25.9*  LYMPHSABS  --  0.6*  --   --   --   MONOABS  --  0.2  --   --   --   EOSABS  --  0.0  --   --   --   BASOSABS  --  0.0  --   --   --     Chemistries   Recent Labs Lab 11/18/14 0621 11/19/14 0356 11/20/14 0402 11/24/14 0420  NA 138 138 139 137  K 3.8 4.0 4.6 3.2*  CL 107 107 108 103  CO2 24 24 18* 26  GLUCOSE 95 148* 105* 123*  BUN <5* <5* 9 10  CREATININE 0.86 0.90 0.66 0.86  CALCIUM 8.2* 9.0 9.3 9.0  MG 1.9 1.6*  --   --   AST  --  19  --  25  ALT  --  19  --  24  ALKPHOS  --  46  --  47  BILITOT  --  0.7  --  0.5   ------------------------------------------------------------------------------------------------------------------ estimated creatinine clearance is 107.2 mL/min (by C-G formula based on Cr of 0.86). ------------------------------------------------------------------------------------------------------------------ No results for input(s): HGBA1C in the last 72 hours. ------------------------------------------------------------------------------------------------------------------ No results for input(s): CHOL, HDL, LDLCALC, TRIG, CHOLHDL, LDLDIRECT in the  last 72 hours. ------------------------------------------------------------------------------------------------------------------ No results for input(s): TSH, T4TOTAL, T3FREE, THYROIDAB in the last 72 hours.  Invalid input(s): FREET3 ------------------------------------------------------------------------------------------------------------------ No results for input(s): VITAMINB12, FOLATE, FERRITIN, TIBC, IRON, RETICCTPCT in the last 72 hours.  Coagulation profile  Recent Labs Lab 11/24/14 0420  INR 1.18    No results for input(s): DDIMER in the last 72 hours.  Cardiac Enzymes No results for input(s): CKMB, TROPONINI, MYOGLOBIN in the last 168 hours.  Invalid input(s): CK ------------------------------------------------------------------------------------------------------------------ Invalid input(s): POCBNP  No results for input(s): GLUCAP in the last 72 hours.  Verneita Griffes, MD [facility] [phone]

## 2014-11-24 NOTE — Progress Notes (Signed)
Patient ID: Victoria Alvarez, female   DOB: February 14, 1991, 24 y.o.   MRN: [id number] 6 Days Post-Op  Subjective: Pt sleepy and doesn't say much.  Had a BM yesterday.  Objective: Vital signs in last 24 hours: Temp:  [98.2 F (36.8 C)-98.3 F (36.8 C)] 98.3 F (36.8 C) (09/15 0531) Pulse Rate:  [47-60] 47 (09/15 0531) Resp:  [18-19] 18 (09/15 0531) BP: (99-130)/(50-73) 130/73 mmHg (09/15 0531) SpO2:  [98 %-100 %] 99 % (09/15 0531) Last BM Date: 11/23/14  Intake/Output from previous day: 09/14 0701 - 09/15 0700 In: 360 [P.O.:360] Out: -  Intake/Output this shift:    PE: Abd: soft, still tender in RLQ, but no guarding or rebounding, +BS, obese Heart: regular Lungs: CTAB  Lab Results:   Recent Labs  11/23/14 0340 11/24/14 0420  WBC 5.9 7.8  HGB 12.4 11.6*  HCT 39.0 36.3  PLT 249 249   BMET  Recent Labs  11/24/14 0420  NA 137  K 3.2*  CL 103  CO2 26  GLUCOSE 123*  BUN 10  CREATININE 0.86  CALCIUM 9.0   PT/INR  Recent Labs  11/24/14 0420  LABPROT 15.2  INR 1.18   CMP     Component Value Date/Time   NA 137 11/24/2014 0420   K 3.2* 11/24/2014 0420   CL 103 11/24/2014 0420   CO2 26 11/24/2014 0420   GLUCOSE 123* 11/24/2014 0420   BUN 10 11/24/2014 0420   CREATININE 0.86 11/24/2014 0420   CREATININE 0.81 07/12/2014 1717   CALCIUM 9.0 11/24/2014 0420   PROT 5.0* 11/24/2014 0420   ALBUMIN 3.4* 11/24/2014 0420   AST 25 11/24/2014 0420   ALT 24 11/24/2014 0420   ALKPHOS 47 11/24/2014 0420   BILITOT 0.5 11/24/2014 0420   GFRNONAA >60 11/24/2014 0420   GFRNONAA >89 07/12/2014 1717   GFRAA >60 11/24/2014 0420   GFRAA >89 07/12/2014 1717   Lipase     Component Value Date/Time   LIPASE 30 11/14/2014 0726       Studies/Results: Dg Abd Portable 1v  11/22/2014   CLINICAL DATA:  Right lower quadrant pain with associated mild generalized abdominal pain.  EXAM: PORTABLE ABDOMEN - 1 VIEW  COMPARISON:  11/17/2014  FINDINGS: The bowel gas pattern is  nonobstructive. There is a long tubular radiolucent structure which seen to arise from the cecum, with associated surrounding soft tissue thickening.  No radio-opaque calculi or other significant radiographic abnormality are seen. Moderate amount of stool throughout the colon. No radiographic evidence of organomegaly. Osseous structures are grossly normal.  IMPRESSION: Nonobstructive bowel gas pattern with evidence of constipation.  Long tubular structure which seems to arise from wall are overlie the distal cecum. This may represent a gas containing thickened distal ileum, or abnormal appendix. In correlation to CT of the abdomen dated 11/14/2014, there is no evidence of appendicitis, and the distal ileum appears inflamed. Therefore, this radiographic finding likely correlates to the segment of abnormal ileum. Radiographic or CT follow-up is recommended.  These results will be called to the ordering clinician or representative by the Radiologist Assistant, and communication documented in the PACS or zVision Dashboard.   Electronically Signed   By: Ted Mcalpine M.D.   On: 11/22/2014 12:15    Anti-infectives: Anti-infectives    Start     Dose/Rate Route Frequency Ordered Stop   11/18/14 1400  metroNIDAZOLE (FLAGYL) tablet 500 mg  Status:  Discontinued     500 mg Oral 3 times per day 11/18/14 [phone]  11/22/14 1617   11/14/14 1530  metroNIDAZOLE (FLAGYL) IVPB 500 mg  Status:  Discontinued     500 mg 100 mL/hr over 60 Minutes Intravenous Every 8 hours 11/14/14 1429 11/18/14 0939       Assessment/Plan  1. 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 If pt con't with signs of stricture issues ie. SBO, perforation acutely we can proceed with stricturoplasty vs. Resection. -will follow  LOS: 10 days    Selma Rodelo E 11/24/2014, 7:59  AM Pager: [phone]

## 2014-11-24 NOTE — Progress Notes (Signed)
Progress Note   Subjective  sleepy. Abdominal pain unimproved. Pain constant, worse with meals. Small BM yesterday   Objective   Vital signs in last 24 hours: Temp:  [98.2 F (36.8 C)-98.3 F (36.8 C)] 98.3 F (36.8 C) (09/15 0531) Pulse Rate:  [47-60] 47 (09/15 0531) Resp:  [18-19] 18 (09/15 0531) BP: (99-130)/(50-73) 130/73 mmHg (09/15 0531) SpO2:  [98 %-100 %] 99 % (09/15 0531) Last BM Date: 11/23/14 General:    white female in NAD Heart:  Regular rate and rhythm Abdomen:  Soft, moderate RLQ tenderness. Normal bowel sounds. Extremities:  Without edema. Neurologic:  sleepy  grossly normal neurologically. Psych:  Cooperative. Normal mood and affect.  Lab Results:  Recent Labs  11/23/14 0340 11/24/14 0420  WBC 5.9 7.8  HGB 12.4 11.6*  HCT 39.0 36.3  PLT 249 249   BMET  Recent Labs  11/24/14 0420  NA 137  K 3.2*  CL 103  CO2 26  GLUCOSE 123*  BUN 10  CREATININE 0.86  CALCIUM 9.0   LFT  Recent Labs  11/24/14 0420  PROT 5.0*  ALBUMIN 3.4*  AST 25  ALT 24  ALKPHOS 47  BILITOT 0.5      Assessment / Plan:    27. 24 year old female with distal ileitis / distal ileal stricture. Path c/w chronic active ileitis. Etiology unclear ? Ischemia ?Crohn's disease Appreciate Surgery's help. Continue IV steroids for now. Plan is to obtain MRA for evaluation of mesenteric vasculature.   2. Chronic pain requiring high dose narcotics.   3. Chronic constipation, will give prn enemas.   Addendum: Spoke with IR (Dr. Earleen Newport) who actually recommended multiphase CTscan (without PO contrast) with arterial, PV phase.     LOS: 10 days   Tye Savoy  11/24/2014, 9:17 AM   Attending Addendum: I have taken an interval history, reviewed the chart, and examined the patient. I agree with the Advanced Practitioner's note and impression. Patient continues to have pain, in fact worse, now on steroids over a week without interval improvement. Given her ESR and WBC are  normal, no improvement in steroids, I question the diagnosis of Crohns. Differential for this ongoing ileitis /stricture is Crohns, ischemic, vasculitis, infectious, malignant (lymphoma), etc. I spoke with pathology who reported ischemic is on the differential based on their review, although would favor Crohns with a more classic history. We obtained a CTA today to evaluate the vasculature and the mesenteric vessels are patent, although her stricture could have represented a prior ischemic insult.    At this time, if this is Crohns, given her failure of steroids, options to induce remission include anti-TNF, surgery, and cyclosporine. I have discussed medical and surgical options. Given she isn't responding to steroids and her course seems a bit atypical for Crohns, surgery up front to resect the short segment and more definitively make a diagnosis is not unreasonable. If she doesn't want this, I offered her Remicade and discussed risks/benefits, although still not certain this is Crohns disease. Following our discussion, her preference is to have this segment resected. I spoke with surgery and they will reassess her tomorrow to discuss it. 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 In this light, if she has surgery, she would not have it until next week.   I asked her to think about this overnight. If she doesn't want to  go through with surgery, I will offer her Remicade, and she is cleared to start therapy. All questions answered.  Weekapaug Cellar, MD [facility] Pager [phone]

## 2014-11-25 DIAGNOSIS — R1031 Right lower quadrant pain: Secondary | ICD-10-CM

## 2014-11-25 DIAGNOSIS — K529 Noninfective gastroenteritis and colitis, unspecified: Secondary | ICD-10-CM

## 2014-11-25 MED ORDER — METHYLPREDNISOLONE SODIUM SUCC 40 MG IJ SOLR: 40.0000 mg | Freq: Every day | INTRAMUSCULAR | Status: DC

## 2014-11-25 MED ORDER — PREDNISONE 20 MG PO TABS
40.0000 mg | ORAL_TABLET | Freq: Every day | ORAL | Status: DC
  Administered 2014-11-26 – 2014-11-27 (×2): 40 mg via ORAL
  Filled 2014-11-25 (×2): qty 2

## 2014-11-25 MED ORDER — SUCRALFATE 1 GM/10ML PO SUSP: 1.0000 g | Freq: Three times a day (TID) | ORAL | Status: DC

## 2014-11-25 MED ORDER — PANTOPRAZOLE SODIUM 40 MG PO TBEC
40.0000 mg | DELAYED_RELEASE_TABLET | Freq: Every day | ORAL | Status: DC
  Administered 2014-11-26 – 2014-12-05 (×10): 40 mg via ORAL
  Filled 2014-11-25 (×10): qty 1

## 2014-11-25 NOTE — Progress Notes (Signed)
[facility]                                                                               24 y.o. 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  [name] has been hospitalized 2x recently for similar symptoms.  She was feeling better after discharge 8/27 but still having cramping pain that was relieved with bowel movements.   9/4 she began having continuous abdominal pain not relieved with bowel movements. The pain is located thru out her abdomen. It stops her from eating. In the ER her lactic acid is 2.88, wbc is 11.6, CT abdomen/pelvis shows: circumferential wall thickening in the distal ileum. She was seen by GI and underwent colonoscopy with distal ileal stricture-possible crohbn's being entertained ANA was done 9/15 Gen surgery evlautating for possible surgeyr   Assessment & Plan     Abdominal Pain secondary to Enteritis-? Crohn's vs other - CT abdomen showed enteritis involving the distal ileum with adjacent inflammation DDx inflammatory, infectious or Crohn's disease.  ESR/CRP wnl. - on Flagyl (allergy to Cipro).Patient started on prednisone by GI in case the ileal stricture represents IBD, no significant improvement in symptoms yet - Colonoscopy 9/9 showed tight but not complete distal ileum stricture that appears ulcerated, inflamed with distal and about 10-15 cm proximal to the IC valve, biopsies appear to be showing active inflammation -Gen surgery consulted 9/14 and non-operative management advised as recent diagnosis Portal vein thrombosis 1 mo ago so risky to take off of AC for procedures otherwise - Continue Bentyl, pain control, solu-medrol 60 daily -flagyll d/c 9/13  Lactic Acidosis - Likely due to acute process currently going on in her bowel.  Resolved with 2L IVF.  Left Portal VeinThrombosis. - Diagnosed on 10/11/14, cont Eliquis. 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 Right sided weakness has resolved, no residual neurologic deficit per patient.  Seizure - Stable, continue Keppra 500 bid  Chronic Pain / Hx of interstitial cystitis Continue 85 mg methadone daily. -seen in [place] in the past by a Dr. Aldona Bar for this -apparently stable  Tobacco Abuse Nicotine Patch  History of depression Appears stable, denies any suicidal/homicidal ideation. Continue Prozac 20 daily.  TSH 4.3, free T4 0.9   Questionable hypercoagulable state;  Hx of miscarriage, stroke, multiple thrombosis.  -Sucpsicion raised as patient has relative-Mother-with Lupus -Also has cousin with Crohn's -Given non-response to Setroids, likely would be beneficial to determine other etiologies such as Lupus enteritis which although Rare might be a unifying diagnosis-I have ordered an ANA which is pending -has been worked up for paroxysmal NH, myeloprolif disorder on admission 10/2014 by Dr. Alvy Bimler -Jak-2 was neg -Protein S was diminished as well that admission -  Cardiolipin IGm was ?  - Unfortunately  missed her appt, hematology to see on d/c  Mild leukopenia: unclear significance, resolved.  Code Status: full code   Family Communication: Discussed in detail with the patient, all imaging results, lab results explained to the patient    Disposition Plan: not medically ready  Time Spent in minutes  25 minutes  Procedures  colonoscopy  Consults   GI   DVT Prophylaxis eliquis  Medications  Scheduled Meds: . apixaban  5 mg Oral BID  . FLUoxetine  20 mg  Oral Daily  . levETIRAcetam  500 mg Oral BID  . methadone  85 mg Oral Daily  . nicotine  21 mg Transdermal Daily  . [START ON 11/26/2014] predniSONE  40 mg Oral QAC breakfast   Continuous Infusions:  PRN Meds:.acetaminophen **OR** [DISCONTINUED] acetaminophen, alum & mag hydroxide-simeth, Influenza vac split quadrivalent PF, ondansetron **OR** ondansetron (ZOFRAN) IV, oxyCODONE-acetaminophen, sodium phosphate   Antibiotics   Anti-infectives    Start     Dose/Rate Route Frequency Ordered Stop   11/18/14 1400  metroNIDAZOLE (FLAGYL) tablet 500 mg  Status:  Discontinued     500 mg Oral 3 times per day 11/18/14 0939 11/22/14 1617   11/14/14 1530  metroNIDAZOLE (FLAGYL) IVPB 500 mg  Status:  Discontinued     500 mg 100 mL/hr over 60 Minutes Intravenous Every 8 hours 11/14/14 1429 11/18/14 0939        Subjective:   No great improvmeent Still had pain tol some po No cp Had 1 stool today No n/v Passing gas Wants something else for indigestion so added carafate  Objective:   Blood pressure 108/65, pulse 51, temperature 98.1 F (36.7 C), temperature source Oral, resp. rate 18, height _0  (1.6 m), weight 91.173 kg (201 lb), SpO2 98 %, not currently breastfeeding.  Wt Readings from Last 3 Encounters:  11/14/14 91.173 kg (201 lb)  11/06/14 91 kg (200 lb 9.9 oz)  10/07/14 77.111 kg (170 lb)     Intake/Output Summary (Last 24 hours) at 11/25/14 1508 Last data filed at 11/25/14 0900  Gross per 24 hour  Intake   1562 ml  Output      0 ml  Net   1562 ml    Exam  General: Alert and oriented x 3, NAD  HEENT:  PERRLA, EOMI,  Neck: Supple  CVS: S1 S2 clear, RRR  Respiratory: Clear to auscultation bilaterally  Abdomen: Soft, diffuse tenderness, nondistended, + bowel sounds  Ext: no cyanosis clubbing or edema  Neuro: no new deficits  Skin: No rashes  Psych: alert and oriented x3    Data Review   Micro Results No results found for this or any previous visit  (from the past 240 hour(s)).  Radiology Reports Dg Abd 1 View  11/15/2014   CLINICAL DATA:  Lower abdominal pain  EXAM: ABDOMEN - 1 VIEW  COMPARISON:  Abdominal radiographs October 14, 2014; CT abdomen and pelvis November 14, 2014  FINDINGS: There is moderate stool in the colon. There is no bowel dilatation or air-fluid level suggesting obstruction. No free air is seen on this supine examination. There are surgical clips in the left pelvic region.  IMPRESSION: Bowel gas pattern overall unremarkable.   Electronically Signed   By: Lowella Grip III M.D.   On: 11/15/2014 12:45   Ct Abdomen Pelvis W Contrast  11/14/2014   CLINICAL DATA:  24 year old female with acute abdominal and pelvic pain with vomiting for 1 day.  EXAM:  CT ABDOMEN AND PELVIS WITH CONTRAST  TECHNIQUE: Multidetector CT imaging of the abdomen and pelvis was performed using the standard protocol following bolus administration of intravenous contrast.  CONTRAST:  164mL OMNIPAQUE IOHEXOL 300 MG/ML  SOLN  COMPARISON:  11/03/2014 and prior CTs  FINDINGS: Lower chest:  Minimal basilar atelectasis noted.  Hepatobiliary: No significant hepatic or gallbladder abnormalities. There is no evidence of biliary dilatation.  Pancreas: Unremarkable  Spleen: Unremarkable  Adrenals/Urinary Tract: Kidneys, adrenal glands and bladder are unremarkable.  Stomach/Bowel: There is circumferential wall thickening of a moderate length segment distal ileum with adjacent inflammation and small amount of interloop fluid. There is mild distention of small bowel loops just proximal to the moderate segment wall thickening. There is no evidence of abscess or pneumoperitoneum. The appendix is normal.  Vascular/Lymphatic: Prominent mesenteric lymph nodes are likely reactive. There is no evidence of vascular abnormality.  Reproductive: Uterus and adnexal regions are unremarkable.  Other: A trace amount of free pelvic fluid is noted.  Musculoskeletal: No acute or suspicious  abnormalities identified.  IMPRESSION: Enteritis involving the distal ileum with adjacent inflammation and small amount of fluid. This likely is inflammatory, infectious or Crohn's disease. No evidence of pneumoperitoneum or abscess.   Electronically Signed   By: Margarette Canada M.D.   On: 11/14/2014 10:28   Ct Abdomen Pelvis W Contrast  11/02/2014   CLINICAL DATA:  Abdominal pain since leaving the hospital a few weeks ago. 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  EXAM: CT ABDOMEN AND PELVIS WITH CONTRAST  TECHNIQUE: Multidetector CT imaging of the abdomen and pelvis was performed using the standard protocol following bolus administration of intravenous contrast.  CONTRAST:  135mL OMNIPAQUE IOHEXOL 300 MG/ML  SOLN  COMPARISON:  10/11/2014  FINDINGS: 3 mm nodule in the left lung base without change since prior study.  Small focal fatty infiltration in the liver adjacent to the falciform ligament. Tiny sub cm low-attenuation lesion in the medial segment left lobe unchanged since prior study. This too small to characterize but probably represents a cyst or hemangioma. No other focal liver lesions. Gallbladder, spleen, pancreas, adrenal glands, kidneys, abdominal aorta, inferior vena cava, and retroperitoneal lymph nodes are unremarkable. Stomach, small bowel, and colon are not abnormally distended. Residual thickening of the wall of the distal ileum. This could represent residua of previous vascular compromise or inflammatory change. Visualized portions of the superior mesenteric artery and vein appear patent. No significant residual portal venous thrombosis. Small amount of free fluid in the lower abdominal mesenteric in the area of thickened bowel. No pneumatosis or portal venous gas. No free air. Mild prominence of mesenteric lymph nodes, probably reactive.  Pelvis: Small amount of free fluid in the pelvis could be reactive or physiologic. Uterus and ovaries are not enlarged. Mild bladder wall thickening  may be due to under distention or cystitis. Appendix is normal. No pelvic mass or lymphadenopathy. No destructive bone lesions.  IMPRESSION: Residual bowel wall thickening in the low abdomen with small amount of adjacent free fluid. This could represent residual vascular compromise or inflammatory process. The previous portal venous thrombosis has resolved.   Electronically Signed   By: Lucienne Capers M.D.   On: 11/02/2014 23:59   Dg Abd Portable 1v  11/22/2014   CLINICAL DATA:  Right lower quadrant pain with associated mild generalized abdominal pain.  EXAM: PORTABLE ABDOMEN - 1 VIEW  COMPARISON:  11/17/2014  FINDINGS: The bowel gas pattern is nonobstructive. There is a long tubular radiolucent structure  which seen to arise from the cecum, with associated surrounding soft tissue thickening.  No radio-opaque calculi or other significant radiographic abnormality are seen. Moderate amount of stool throughout the colon. No radiographic evidence of organomegaly. Osseous structures are grossly normal.  IMPRESSION: Nonobstructive bowel gas pattern with evidence of constipation.  Long tubular structure which seems to arise from wall are overlie the distal cecum. This may represent a gas containing thickened distal ileum, or abnormal appendix. In correlation to CT of the abdomen dated 11/14/2014, there is no evidence of appendicitis, and the distal ileum appears inflamed. Therefore, this radiographic finding likely correlates to the segment of abnormal ileum. Radiographic or CT follow-up is recommended.  These results will be called to the ordering clinician or representative by the Radiologist Assistant, and communication documented in the PACS or zVision Dashboard.   Electronically Signed   By: Fidela Salisbury M.D.   On: 11/22/2014 12:15   Ct Angio Abd/pel W/ And/or W/o  11/24/2014   CLINICAL DATA:  History of Crohn's disease. Evaluate for portal vein thrombosis.  EXAM: CT ANGIOGRAPHY ABDOMEN AND PELVIS WITH  CONTRAST AND WITHOUT CONTRAST  TECHNIQUE: Multidetector CT imaging of the abdomen and pelvis was performed using the standard protocol during bolus administration of intravenous contrast. Multiplanar reconstructed images including MIPs were obtained and reviewed to evaluate the vascular anatomy.  CONTRAST:  134m OMNIPAQUE IOHEXOL 350 MG/ML SOLN  COMPARISON:  CT abdomen pelvis - 11/14/2014  FINDINGS: Vascular Findings:  Abdominal aorta: There is no atherosclerotic plaque within a normal caliber abdominal aorta. No abdominal aortic dissection No aortic wall thickening or periaortic stranding.  Celiac artery: Widely patent.  Conventional branching pattern.  SMA: Widely patent. 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 The distal tributaries of the SMA are widely patent without discrete intraluminal filling defect to suggest distal embolism.  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 The dominant cranial right-sided renal artery is widely patent without a hemodynamically significant stenosis. No vessel irregularity to suggest FMD.  Left Renal artery: Duplicated; there is an accessory left renal artery which supplies the inferior pole of the left kidney. The dominant left-sided renal artery is widely patent without a hemodynamically significant stenosis. No vessel irregularity to suggest FMD.  IMA: Widely patent without hemodynamically significant stenosis.  Pelvic vasculature: The bilateral common, external and internal iliac arteries are of normal caliber and widely patent without hemodynamically significant stenosis.  Venous Findings: The portal vein is widely patent. The IVC and bilateral pelvic venous systems are widely patent.  Review of the MIP images confirms the above findings.    --------------------------------------------------------------------------------  Nonvascular Findings:  Normal hepatic contour. There is a minimal amount of focal fatty infiltration adjacent to the fissure for ligamentum teres. No discrete hepatic lesions. Normal appearance of the gallbladder. No intra extrahepatic biliary duct dilatation. No ascites.  There is symmetric enhancement and excretion of the bilateral kidneys. No definite renal stones in this postcontrast examination. No discrete renal lesions. No urinary obstruction or perinephric stranding. Normal appearance of the bilateral adrenal glands, pancreas and spleen.  Note is again made of short segment bowel wall thickening involving an approximately 20 cm segment of the distal ileum though sparing the terminal ileum (representative images 57 through 62, series 11). This finding is associated with mild upstream distension of  the adjacent distal ileum (representative image 42, series 11) but not resulting in enteric obstruction. The amount of previously noted adjacent mesenteric stranding and reactive adenopathy within the adjacent mesenteries has decreased in the interval. No evidence of perforation or definable/drainable fluid collection. No pneumoperitoneum, pneumatosis or portal venous gas.  Large colonic stool burden. Normal appearance of the retrocecal appendix.  No bulky retroperitoneal, mesenteric, pelvic or inguinal lymphadenopathy.  Normal appearance of the pelvic organs. No discrete adnexal lesion. Normal appearance of the urinary bladder given underdistention. No free fluid in the pelvic cul-de-sac.  Limited visualization of lower thorax demonstrates minimal subsegmental atelectasis within the image caudal aspect the left lower lobe. No focal airspace opacities. No pleural effusion. Normal heart size. No pericardial effusion.  No acute or aggressive osseous abnormalities. Note is made of mild bilateral symmetric sacroiliitis potentially  attributable to provided history of Crohn's disease.  Tiny mesenteric fat containing periumbilical hernia. There is a minimal amount of soft tissue stranding about the midline of the low back.  IMPRESSION: Vascular Impression:  1. Unremarkable CTA of the abdomen and pelvis. No evidence of mesenteric ischemia or portal vein thrombosis. 2. 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 Nonvascular Impression:  1. Minimally improved bowel wall thickening involving an approximately 20 cm segment of the distal ileum with sparing of the TI and cecum, findings compatible with provided history of Crohn's disease. There is mild upstream distention of the adjacent small bowel without evidence of enteric obstruction. No evidence of perforation or definable/drainable fluid collection. 2. Suspected mild bilateral symmetric sacroiliitis, potentially attributable to provided history of Crohn's disease.   Electronically Signed   By: Sandi Mariscal M.D.   On: 11/24/2014 16:37   Ct Angio Abd/pel W/ And/or W/o  11/03/2014   CLINICAL DATA:  Severe left lower quadrant pain, and nausea. Patient with portal venous and cerebral venous thrombosis.  EXAM: CT ANGIOGRAPHY ABDOMEN AND PELVIS WITH CONTRAST AND WITHOUT CONTRAST  TECHNIQUE: Multidetector CT imaging of the abdomen and pelvis was performed using the standard protocol during bolus administration of intravenous contrast. Multiplanar reconstructed images including MIPs were obtained and reviewed to evaluate the vascular anatomy.  CONTRAST:  100 cc Omnipaque 350.  COMPARISON:  CT of the abdomen pelvis dated 11/02/2014  FINDINGS: Arterial findings  Aorta  Normal in caliber, without filling defects or stenosis.  Celiac axis  Normal in caliber, without filling defects or stenosis.  Superior mesenteric  Normal in caliber, without filling defects or stenosis.  Left  renal  Normal in caliber, without filling defects or stenosis. Accessory left renal artery is noted.  Right renal  Normal in caliber, without filling defects or stenosis.  Inferior mesenteric  Normal in caliber, without filling defects or stenosis.  Left iliac  Normal in caliber, without filling defects or stenosis.  Right iliac  Normal in caliber, without filling defects or stenosis.  Venous findings  No evidence of portal venous thrombosis.  Review of the MIP images confirms the above findings.  Nonvascular findings  There is mesenteric stranding and small amount of free fluid within the mid left abdomen. There is also a long segment of bowel thickening of the distal third of the ileum. The distal most ileum is noted to be distended and fluid-filled with maximum diameter of 2.7 cm. The terminal ileum is prominent. The appendix is normal. The remainder of the upper GI tract and colon are normal. Multiple sub  cm rounded mesenteric lymph nodes are seen within the same area of the abdomen. There is a tiny amount of free fluid, tracking along the left pericolic gutter. There is a small amount of free fluid within the pelvis as well.  Focal fatty infiltration along the falciform ligament of the liver is noted, otherwise the liver appears normal. The spleen, pancreas, bilateral adrenal glands, bilateral kidneys are normal. Vicarious excretion of contrast within the otherwise normal gallbladder is noted.  IMPRESSION: No evidence of a arterial or venous thrombosis.  Increased mesenteric stranding with interspersed shotty mesenteric lymph nodes within the mid left abdomen, in the area of long segment of thickening of the distal ileum. Thickening of the terminal ileum and a focal distention of up to 2.7 cm of the distal ileum leading to it. These findings may represent infectious, inflammatory or ischemic bowel changes, with associated reactive lymphadenopathy. Alternatively mesenteric adenitis is of consideration.    Electronically Signed   By: Fidela Salisbury M.D.   On: 11/03/2014 11:42    CBC  Recent Labs Lab 11/19/14 0356 11/20/14 1230 11/23/14 0340 11/24/14 0420  WBC 5.3 5.7 5.9 7.8  HGB 11.2* 11.7* 12.4 11.6*  HCT 35.2* 37.3 39.0 36.3  PLT 223 237 249 249  MCV 73.0* 73.7* 73.3* 73.9*  MCH 23.2* 23.1* 23.3* 23.6*  MCHC 31.8 31.4 31.8 32.0  RDW 25.7* 26.3* 25.8* 25.9*  LYMPHSABS 0.6*  --   --   --   MONOABS 0.2  --   --   --   EOSABS 0.0  --   --   --   BASOSABS 0.0  --   --   --     Chemistries   Recent Labs Lab 11/19/14 0356 11/20/14 0402 11/24/14 0420  NA 138 139 137  K 4.0 4.6 3.2*  CL 107 108 103  CO2 24 18* 26  GLUCOSE 148* 105* 123*  BUN <5* 9 10  CREATININE 0.90 0.66 0.86  CALCIUM 9.0 9.3 9.0  MG 1.6*  --   --   AST 19  --  25  ALT 19  --  24  ALKPHOS 46  --  47  BILITOT 0.7  --  0.5   ------------------------------------------------------------------------------------------------------------------ estimated creatinine clearance is 107.2 mL/min (by C-G formula based on Cr of 0.86). ------------------------------------------------------------------------------------------------------------------ No results for input(s): HGBA1C in the last 72 hours. ------------------------------------------------------------------------------------------------------------------ No results for input(s): CHOL, HDL, LDLCALC, TRIG, CHOLHDL, LDLDIRECT in the last 72 hours. ------------------------------------------------------------------------------------------------------------------ No results for input(s): TSH, T4TOTAL, T3FREE, THYROIDAB in the last 72 hours.  Invalid input(s): FREET3 ------------------------------------------------------------------------------------------------------------------ No results for input(s): VITAMINB12, FOLATE, FERRITIN, TIBC, IRON, RETICCTPCT in the last 72 hours.  Coagulation profile  Recent Labs Lab 11/24/14 0420  INR 1.18    No  results for input(s): DDIMER in the last 72 hours.  Cardiac Enzymes No results for input(s): CKMB, TROPONINI, MYOGLOBIN in the last 168 hours.  Invalid input(s): CK ------------------------------------------------------------------------------------------------------------------ Invalid input(s): POCBNP  No results for input(s): GLUCAP in the last 72 hours.  Verneita Griffes, MD [facility] [phone]

## 2014-11-25 NOTE — Progress Notes (Signed)
Patient ID: Victoria Alvarez, female   DOB: November 21, 1990, 24 y.o.   MRN: [id number] 7 Days Post-Op  Subjective: Pt's pain persists  Objective: Vital signs in last 24 hours: Temp:  [98.1 F (36.7 C)-98.6 F (37 C)] 98.1 F (36.7 C) (09/16 0626) Pulse Rate:  [51-71] 51 (09/16 0626) Resp:  [18] 18 (09/16 0626) BP: (108-130)/(59-86) 108/65 mmHg (09/16 0626) SpO2:  [98 %-100 %] 98 % (09/16 0626) Last BM Date: 11/23/14  Intake/Output from previous day: 09/15 0701 - 09/16 0700 In: 1542 [P.O.:1542] Out: -  Intake/Output this shift:    PE: Abd: soft, still tender in RLQ, +BS, obese  Lab Results:   Recent Labs  11/23/14 0340 11/24/14 0420  WBC 5.9 7.8  HGB 12.4 11.6*  HCT 39.0 36.3  PLT 249 249   BMET  Recent Labs  11/24/14 0420  NA 137  K 3.2*  CL 103  CO2 26  GLUCOSE 123*  BUN 10  CREATININE 0.86  CALCIUM 9.0   PT/INR  Recent Labs  11/24/14 0420  LABPROT 15.2  INR 1.18   CMP     Component Value Date/Time   NA 137 11/24/2014 0420   K 3.2* 11/24/2014 0420   CL 103 11/24/2014 0420   CO2 26 11/24/2014 0420   GLUCOSE 123* 11/24/2014 0420   BUN 10 11/24/2014 0420   CREATININE 0.86 11/24/2014 0420   CREATININE 0.81 07/12/2014 1717   CALCIUM 9.0 11/24/2014 0420   PROT 5.0* 11/24/2014 0420   ALBUMIN 3.4* 11/24/2014 0420   AST 25 11/24/2014 0420   ALT 24 11/24/2014 0420   ALKPHOS 47 11/24/2014 0420   BILITOT 0.5 11/24/2014 0420   GFRNONAA >60 11/24/2014 0420   GFRNONAA >89 07/12/2014 1717   GFRAA >60 11/24/2014 0420   GFRAA >89 07/12/2014 1717   Lipase     Component Value Date/Time   LIPASE 30 11/14/2014 0726       Studies/Results: Ct Angio Abd/pel W/ And/or W/o  11/24/2014   CLINICAL DATA:  History of Crohn's disease. Evaluate for portal vein thrombosis.  EXAM: CT ANGIOGRAPHY ABDOMEN AND PELVIS WITH CONTRAST AND WITHOUT CONTRAST  TECHNIQUE: Multidetector CT imaging of the abdomen and pelvis was performed using the standard protocol during bolus  administration of intravenous contrast. Multiplanar reconstructed images including MIPs were obtained and reviewed to evaluate the vascular anatomy.  CONTRAST:  180m OMNIPAQUE IOHEXOL 350 MG/ML SOLN  COMPARISON:  CT abdomen pelvis - 11/14/2014  FINDINGS: Vascular Findings:  Abdominal aorta: There is no atherosclerotic plaque within a normal caliber abdominal aorta. No abdominal aortic dissection No aortic wall thickening or periaortic stranding.  Celiac artery: Widely patent.  Conventional branching pattern.  SMA: Widely patent. 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 The distal tributaries of the SMA are widely patent without discrete intraluminal filling defect to suggest distal embolism.  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 The dominant cranial right-sided renal artery is widely patent without a hemodynamically significant stenosis. No vessel irregularity to suggest FMD.  Left Renal artery: Duplicated; there is an accessory left renal artery which supplies the inferior pole of the left kidney. The dominant left-sided renal artery is widely patent without a hemodynamically significant stenosis. No vessel irregularity to suggest FMD.  IMA: Widely patent without hemodynamically significant stenosis.  Pelvic vasculature: The  bilateral common, external and internal iliac arteries are of normal caliber and widely patent without hemodynamically significant stenosis.  Venous Findings: The portal vein is widely patent. The IVC and bilateral pelvic venous systems are widely patent.  Review of the MIP images confirms the above findings.   --------------------------------------------------------------------------------  Nonvascular Findings:  Normal hepatic contour. There is a minimal amount of focal fatty  infiltration adjacent to the fissure for ligamentum teres. No discrete hepatic lesions. Normal appearance of the gallbladder. No intra extrahepatic biliary duct dilatation. No ascites.  There is symmetric enhancement and excretion of the bilateral kidneys. No definite renal stones in this postcontrast examination. No discrete renal lesions. No urinary obstruction or perinephric stranding. Normal appearance of the bilateral adrenal glands, pancreas and spleen.  Note is again made of short segment bowel wall thickening involving an approximately 20 cm segment of the distal ileum though sparing the terminal ileum (representative images 57 through 62, series 11). This finding is associated with mild upstream distension of the adjacent distal ileum (representative image 42, series 11) but not resulting in enteric obstruction. The amount of previously noted adjacent mesenteric stranding and reactive adenopathy within the adjacent mesenteries has decreased in the interval. No evidence of perforation or definable/drainable fluid collection. No pneumoperitoneum, pneumatosis or portal venous gas.  Large colonic stool burden. Normal appearance of the retrocecal appendix.  No bulky retroperitoneal, mesenteric, pelvic or inguinal lymphadenopathy.  Normal appearance of the pelvic organs. No discrete adnexal lesion. Normal appearance of the urinary bladder given underdistention. No free fluid in the pelvic cul-de-sac.  Limited visualization of lower thorax demonstrates minimal subsegmental atelectasis within the image caudal aspect the left lower lobe. No focal airspace opacities. No pleural effusion. Normal heart size. No pericardial effusion.  No acute or aggressive osseous abnormalities. Note is made of mild bilateral symmetric sacroiliitis potentially attributable to provided history of Crohn's disease.  Tiny mesenteric fat containing periumbilical hernia. There is a minimal amount of soft tissue stranding about the midline of  the low back.  IMPRESSION: Vascular Impression:  1. Unremarkable CTA of the abdomen and pelvis. No evidence of mesenteric ischemia or portal vein thrombosis. 2. 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 Nonvascular Impression:  1. Minimally improved bowel wall thickening involving an approximately 20 cm segment of the distal ileum with sparing of the TI and cecum, findings compatible with provided history of Crohn's disease. There is mild upstream distention of the adjacent small bowel without evidence of enteric obstruction. No evidence of perforation or definable/drainable fluid collection. 2. Suspected mild bilateral symmetric sacroiliitis, potentially attributable to provided history of Crohn's disease.   Electronically Signed   By: Sandi Mariscal M.D.   On: 11/24/2014 16:37    Anti-infectives: Anti-infectives    Start     Dose/Rate Route Frequency Ordered Stop   11/18/14 1400  metroNIDAZOLE (FLAGYL) tablet 500 mg  Status:  Discontinued     500 mg Oral 3 times per day 11/18/14 0939 11/22/14 1617   11/14/14 1530  metroNIDAZOLE (FLAGYL) IVPB 500 mg  Status:  Discontinued     500 mg 100 mL/hr over 60 Minutes Intravenous Every 8 hours 11/14/14 1429 11/18/14 0939       Assessment/Plan  1. 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 Havery Moros d/w Dr. Rosendo Gros yesterday.  Pathology is not confirmative whether this is truly crohn's disease vs ischemic in nature given her recent CVA  and SMV thrombus.   -the decision for surgery would need to come from Dr. Redmond Pulling as he is the surgeon taking over next week and this is not a straight forward case, complicated by anticoagulation, and higher surgical risk.  He will be on call tomorrow and the case will be discussed with him to determine further management. -cont current care for now.   LOS: 11 days     OSBORNE,KELLY E 11/25/2014, 10:21 AM Pager: [phone]

## 2014-11-25 NOTE — Progress Notes (Signed)
Daily Rounding Note  11/25/2014, 1:27 PM  LOS: 11 days   SUBJECTIVE:       Ongoing abd pain, constant.  Used 9 mg of oxycodone daily for last 2 days, 4 mg thus far today.  Additionally getting her home dose of 85 mg Methadone. Eating ~ 25 % of full liquids.  BM recorded on 9/14, another today.  Still with significant abd pain, worse on right.    OBJECTIVE:         Vital signs in last 24 hours:    Temp:  [98.1 F (36.7 C)-98.6 F (37 C)] 98.1 F (36.7 C) (09/16 0626) Pulse Rate:  [51-71] 51 (09/16 0626) Resp:  [18] 18 (09/16 0626) BP: (108-130)/(59-86) 108/65 mmHg (09/16 0626) SpO2:  [98 %-100 %] 98 % (09/16 0626) Last BM Date: 11/24/14 Filed Weights   11/14/14 0715 11/14/14 1432  Weight: 200 lb 9.9 oz (91 kg) 201 lb (91.173 kg)   General: looks well, healthy, comfortable   Heart: RRR Chest: clear bil.   Abdomen: soft, ND.  BS active.  Tender at right at waist level.   Extremities: no CCE Neuro/Psych:  orinted x 3, seems less depressed and more engaged.   Intake/Output from previous day: 09/15 0701 - 09/16 0700 In: 1542 [P.O.:1542] Out: -   Intake/Output this shift: Total I/O In: 240 [P.O.:240] Out: -   Lab Results:  Recent Labs  11/23/14 0340 11/24/14 0420  WBC 5.9 7.8  HGB 12.4 11.6*  HCT 39.0 36.3  PLT 249 249   BMET  Recent Labs  11/24/14 0420  NA 137  K 3.2*  CL 103  CO2 26  GLUCOSE 123*  BUN 10  CREATININE 0.86  CALCIUM 9.0   LFT  Recent Labs  11/24/14 0420  PROT 5.0*  ALBUMIN 3.4*  AST 25  ALT 24  ALKPHOS 47  BILITOT 0.5   PT/INR  Recent Labs  11/24/14 0420  LABPROT 15.2  INR 1.18   Hepatitis Panel No results for input(s): HEPBSAG, HCVAB, HEPAIGM, HEPBIGM in the last 72 hours.  Studies/Results: Ct Angio Abd/pel W/ And/or W/o  11/24/2014   CLINICAL DATA:  History of Crohn's disease. Evaluate for portal vein thrombosis.  EXAM: CT ANGIOGRAPHY ABDOMEN AND PELVIS  WITH CONTRAST AND WITHOUT CONTRAST  TECHNIQUE: Multidetector CT imaging of the abdomen and pelvis was performed using the standard protocol during bolus administration of intravenous contrast. Multiplanar reconstructed images including MIPs were obtained and reviewed to evaluate the vascular anatomy.  CONTRAST:  111m OMNIPAQUE IOHEXOL 350 MG/ML SOLN  COMPARISON:  CT abdomen pelvis - 11/14/2014  FINDINGS: Vascular Findings:  Abdominal aorta: There is no atherosclerotic plaque within a normal caliber abdominal aorta. No abdominal aortic dissection No aortic wall thickening or periaortic stranding.  Celiac artery: Widely patent.  Conventional branching pattern.  SMA: Widely patent. 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 The distal tributaries of the SMA are widely patent without discrete intraluminal filling defect to suggest distal embolism.  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 The dominant cranial right-sided renal artery is widely patent without a hemodynamically significant stenosis. No vessel irregularity to suggest FMD.  Left Renal artery: Duplicated; there is an accessory left renal artery which supplies the inferior pole of the left  kidney. The dominant left-sided renal artery is widely patent without a hemodynamically significant stenosis. No vessel irregularity to suggest FMD.  IMA: Widely patent without hemodynamically significant stenosis.  Pelvic vasculature: The bilateral common, external and internal iliac arteries are of normal caliber and widely patent without hemodynamically significant stenosis.  Venous Findings: The portal vein is widely patent. The IVC and bilateral pelvic venous systems are widely patent.  Review of the MIP images confirms the above findings.    --------------------------------------------------------------------------------  Nonvascular Findings:  Normal hepatic contour. There is a minimal amount of focal fatty infiltration adjacent to the fissure for ligamentum teres. No discrete hepatic lesions. Normal appearance of the gallbladder. No intra extrahepatic biliary duct dilatation. No ascites.  There is symmetric enhancement and excretion of the bilateral kidneys. No definite renal stones in this postcontrast examination. No discrete renal lesions. No urinary obstruction or perinephric stranding. Normal appearance of the bilateral adrenal glands, pancreas and spleen.  Note is again made of short segment bowel wall thickening involving an approximately 20 cm segment of the distal ileum though sparing the terminal ileum (representative images 57 through 62, series 11). This finding is associated with mild upstream distension of the adjacent distal ileum (representative image 42, series 11) but not resulting in enteric obstruction. The amount of previously noted adjacent mesenteric stranding and reactive adenopathy within the adjacent mesenteries has decreased in the interval. No evidence of perforation or definable/drainable fluid collection. No pneumoperitoneum, pneumatosis or portal venous gas.  Large colonic stool burden. Normal appearance of the retrocecal appendix.  No bulky retroperitoneal, mesenteric, pelvic or inguinal lymphadenopathy.  Normal appearance of the pelvic organs. No discrete adnexal lesion. Normal appearance of the urinary bladder given underdistention. No free fluid in the pelvic cul-de-sac.  Limited visualization of lower thorax demonstrates minimal subsegmental atelectasis within the image caudal aspect the left lower lobe. No focal airspace opacities. No pleural effusion. Normal heart size. No pericardial effusion.  No acute or aggressive osseous abnormalities. Note is made of mild bilateral symmetric sacroiliitis potentially  attributable to provided history of Crohn's disease.  Tiny mesenteric fat containing periumbilical hernia. There is a minimal amount of soft tissue stranding about the midline of the low back.  IMPRESSION: Vascular Impression:  1. Unremarkable CTA of the abdomen and pelvis. No evidence of mesenteric ischemia or portal vein thrombosis. 2. 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 Nonvascular Impression:  1. Minimally improved bowel wall thickening involving an approximately 20 cm segment of the distal ileum with sparing of the TI and cecum, findings compatible with provided history of Crohn's disease. There is mild upstream distention of the adjacent small bowel without evidence of enteric obstruction. No evidence of perforation or definable/drainable fluid collection. 2. Suspected mild bilateral symmetric sacroiliitis, potentially attributable to provided history of Crohn's disease.   Electronically Signed   By: Sandi Mariscal M.D.   On: 11/24/2014 16:37   Scheduled Meds: . apixaban  5 mg Oral BID  . FLUoxetine  20 mg Oral Daily  . levETIRAcetam  500 mg Oral BID  . methadone  85 mg Oral Daily  . methylPREDNISolone (SOLU-MEDROL) injection  60 mg Intravenous Daily  . 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 Minimal improvement of distal ileal thickening. Incidental bil sacroiliitis.  Continues on Solumedrol.   * Clotting disorder. 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   * Chronic constipation, on Linzess.  Finally had BM yesterday and today.     PLAN   *  Switch to prednisone 40 mg with goal to eventually taper.   *  Surgical resection being considered for next week.  Will need to be off Eliquis for surgery and it is still in use.   Barring surgery, initiation of Remicade is next step.     Azucena Freed  11/25/2014, 1:27 PM Pager: [phone]   Attending Addendum: I have taken an interval history, reviewed the chart, and examined the patient. I agree with the Advanced Practitioner's note and impression. Patient continues to have pain, she has not had any improvement after a week of steroids. Given her ESR and WBC are normal, no improvement in steroids, I question the diagnosis of Crohns although it remains high on the differential. The differential for this ongoing ileitis /stricture is Crohns, ischemic, vasculitis, infectious, malignant (lymphoma), etc. Pathology ammended their report and ischemic is on the differential based on their review, although they would favor Crohns with a more classic history. CTA was done to evaluate the vasculature and the mesenteric vessels are patent, although her stricture could have represented a prior ischemic insult.   I have spoken with the patient at length about her options for treatment at this time. If this is Crohns, given her failure of steroids, options to induce remission include anti-TNF, surgery, and cyclosporine. I have discussed medical and surgical options. Given she isn't responding to steroids and her course seems a bit atypical for Crohns, surgery up front to resect the short segment and more definitively make a diagnosis is not unreasonable. This is her preference at this time. I otherwise offered her Remicade and discussed risks/benefits, although still not certain this is Crohns disease. Following our discussion, her preference is to have this segment resected. Surgery will evaluate her tomorrow for this. She is higher than average risk for surgery with her prothrombotic state, and  would need to be bridged with lovenox and hold Eliquis for at least 2-3 days. In this light, if she has surgery, she would not have it until next week. If she has surgery and the diagnosis turns out to be Crohns, I would recommend anti-TNF therapy within a month of surgery.   In the interim, patient is hungry and clear liquid diet has not helped at all. Will advance to regular as tolerated. In preparation for upcoming possible surgery and that steroids have not helped, will wean off steroids, and transition back to oral and taper off.   Seiling Cellar, MD [facility] Pager [phone]

## 2014-11-26 MED ORDER — HEPARIN (PORCINE) IN NACL 100-0.45 UNIT/ML-% IJ SOLN
1000.0000 [IU]/h | INTRAMUSCULAR | Status: DC
  Administered 2014-11-26 – 2014-11-27 (×2): 1100 [IU]/h via INTRAVENOUS
  Filled 2014-11-26 (×3): qty 250

## 2014-11-26 NOTE — Progress Notes (Signed)
Progress Note   Subjective  Patient continues to have abdominal pain. Essentially no difference in her symptoms since yesterday. She has been tolerating a diet and is not vomiting.   Objective   Vital signs in last 24 hours: Temp:  [98.3 F (36.8 C)-98.9 F (37.2 C)] 98.5 F (36.9 C) (09/17 0512) Pulse Rate:  [51-83] 51 (09/17 0512) Resp:  [16-18] 16 (09/17 0512) BP: (106-128)/(54-73) 106/60 mmHg (09/17 0512) SpO2:  [96 %-99 %] 96 % (09/17 0512) Last BM Date: 11/24/14 General:    white female in NAD Heart:  Regular rate and rhythm; no murmurs Lungs: Respirations even and unlabored, lungs CTA bilaterally Abdomen:  Soft, RLQ TTP without rebound or guarding, no mass, Normal bowel sounds. Extremities:  Without edema. Neurologic:  Alert and oriented,  grossly normal neurologically. Psych:  Cooperative. Normal mood and affect.  Intake/Output from previous day: 09/16 0701 - 09/17 0700 In: 1146 [P.O.:1146] Out: -  Intake/Output this shift: Total I/O In: 360 [P.O.:360] Out: -   Lab Results:  Recent Labs  11/24/14 0420  WBC 7.8  HGB 11.6*  HCT 36.3  PLT 249   BMET  Recent Labs  11/24/14 0420  NA 137  K 3.2*  CL 103  CO2 26  GLUCOSE 123*  BUN 10  CREATININE 0.86  CALCIUM 9.0   LFT  Recent Labs  11/24/14 0420  PROT 5.0*  ALBUMIN 3.4*  AST 25  ALT 24  ALKPHOS 47  BILITOT 0.5   PT/INR  Recent Labs  11/24/14 0420  LABPROT 15.2  INR 1.18    Studies/Results: Ct Angio Abd/pel W/ And/or W/o  11/24/2014   CLINICAL DATA:  History of Crohn's disease. Evaluate for portal vein thrombosis.  EXAM: CT ANGIOGRAPHY ABDOMEN AND PELVIS WITH CONTRAST AND WITHOUT CONTRAST  TECHNIQUE: Multidetector CT imaging of the abdomen and pelvis was performed using the standard protocol during bolus administration of intravenous contrast. Multiplanar reconstructed images including MIPs were obtained and reviewed to evaluate the vascular anatomy.  CONTRAST:  180m  OMNIPAQUE IOHEXOL 350 MG/ML SOLN  COMPARISON:  CT abdomen pelvis - 11/14/2014  FINDINGS: Vascular Findings:  Abdominal aorta: There is no atherosclerotic plaque within a normal caliber abdominal aorta. No abdominal aortic dissection No aortic wall thickening or periaortic stranding.  Celiac artery: Widely patent.  Conventional branching pattern.  SMA: Widely patent. 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 The distal tributaries of the SMA are widely patent without discrete intraluminal filling defect to suggest distal embolism.  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 The dominant cranial right-sided renal artery is widely patent without a hemodynamically significant stenosis. No vessel irregularity to suggest FMD.  Left Renal artery: Duplicated; there is an accessory left renal artery which supplies the inferior pole of the left kidney. The dominant left-sided renal artery is widely patent without a hemodynamically significant stenosis. No vessel irregularity to suggest FMD.  IMA: Widely patent without hemodynamically significant stenosis.  Pelvic vasculature: The bilateral common, external and internal iliac arteries are of normal caliber and widely patent without hemodynamically significant stenosis.  Venous Findings: The portal vein is widely patent. The IVC and bilateral pelvic venous systems are widely patent.  Review of the MIP images confirms the above findings.   --------------------------------------------------------------------------------  Nonvascular Findings:  Normal  hepatic contour. There is a minimal amount of focal fatty infiltration adjacent to the fissure for ligamentum teres. No discrete hepatic lesions. Normal appearance of the gallbladder. No intra extrahepatic biliary duct dilatation. No  ascites.  There is symmetric enhancement and excretion of the bilateral kidneys. No definite renal stones in this postcontrast examination. No discrete renal lesions. No urinary obstruction or perinephric stranding. Normal appearance of the bilateral adrenal glands, pancreas and spleen.  Note is again made of short segment bowel wall thickening involving an approximately 20 cm segment of the distal ileum though sparing the terminal ileum (representative images 57 through 62, series 11). This finding is associated with mild upstream distension of the adjacent distal ileum (representative image 42, series 11) but not resulting in enteric obstruction. The amount of previously noted adjacent mesenteric stranding and reactive adenopathy within the adjacent mesenteries has decreased in the interval. No evidence of perforation or definable/drainable fluid collection. No pneumoperitoneum, pneumatosis or portal venous gas.  Large colonic stool burden. Normal appearance of the retrocecal appendix.  No bulky retroperitoneal, mesenteric, pelvic or inguinal lymphadenopathy.  Normal appearance of the pelvic organs. No discrete adnexal lesion. Normal appearance of the urinary bladder given underdistention. No free fluid in the pelvic cul-de-sac.  Limited visualization of lower thorax demonstrates minimal subsegmental atelectasis within the image caudal aspect the left lower lobe. No focal airspace opacities. No pleural effusion. Normal heart size. No pericardial effusion.  No acute or aggressive osseous abnormalities. Note is made of mild bilateral symmetric sacroiliitis potentially attributable to provided history of Crohn's disease.  Tiny mesenteric fat containing periumbilical hernia. There is a minimal amount of soft tissue stranding about the midline of the low back.  IMPRESSION: Vascular Impression:  1. Unremarkable CTA of the abdomen and pelvis. No evidence of mesenteric ischemia or portal vein thrombosis. 2. 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 Nonvascular Impression:  1. Minimally improved bowel wall thickening involving an approximately 20 cm segment of the distal ileum with sparing of the TI and cecum, findings compatible with provided history of Crohn's disease. There is mild upstream distention of the adjacent small bowel without evidence of enteric obstruction. No evidence of perforation or definable/drainable fluid collection. 2. Suspected mild bilateral symmetric sacroiliitis, potentially attributable to provided history of Crohn's disease.   Electronically Signed   By: Sandi Mariscal M.D.   On: 11/24/2014 16:37       Assessment / Plan:    24 y/o female with chronic abdominal pains, who presented with ileitis / stricture of the ileum. Biopsies show chronic active ileitis however differential includes ischemic, vasculitis, infectious, less likely lymphoma, etc. We have treated her empirically for Crohns since colonoscopy however has had no benefit following over a week of steroids.  Given her ESR and WBC are normal, no improvement in steroids, I question the diagnosis of Crohns although it remains high on the differential. Pathology ammended their report and ischemic is on the differential based on their review. CTA was done to evaluate the vasculature and the mesenteric vessels are patent, although her stricture could have represented a prior ischemic insult.   If this is Crohns, given her failure of steroids, options to induce remission include anti-TNF, surgery, and cyclosporine. I have discussed medical and surgical options. Given she isn't responding to steroids and her course seems a bit atypical for Crohns, surgery up front to resect the short segment to more definitively  make a diagnosis is not unreasonable. A diagnosis of Crohns has significant implications for her and would  warrant long term therapy. She is at higher than average risk for surgery however and it remains unclear what is driving her prothrombotic state. I have discussed her case with general surgery today again and there is concern that resection of this segment may not resolve her pain, as she does have chronic abdominal pain otherwise. I have conveyed this concern to the patient and she understands this. She is not currently obstructed. After discussion of all of these issues, her preference at this time is surgery to make a definitive diagnosis, and is hesitant to commit to Remicade without it. I will speak with the surgery who will be covering next week. Given the complexity of her case, may consider another opinion from a tertiary care center.   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 In this light, if she has surgery, she would not have it until next week. In discussion with surgery today, we will ask Hematology to weigh in on her hypercoagulable state.   In the interim, we have weaned off IV steroids given no benefit and transition to oral with taper. If she doesn't go through with surgery, I will talk with her further about trial of Remicade.   Poyen Cellar, MD [facility] Pager [phone]  Principal Problem:   Abdominal pain Active Problems:   Tobacco abuse   Seizure   Methadone maintenance therapy patient   Portal vein thrombosis   Cerebral venous sinus thrombosis   Enteritis   Lactic acidosis   Generalized abdominal pain   Ileitis     LOS: 12 days   Victoria Alvarez  11/26/2014, 10:18 AM

## 2014-11-26 NOTE — Progress Notes (Signed)
ANTICOAGULATION CONSULT NOTE - Initial Consult  Pharmacy Consult for heparin Indication: Portal vein thrombosis  Allergies  Allergen Reactions  . Ativan [Lorazepam] Other (See Comments)    " I don't remember anything."  . Bee Venom Swelling  . Ciprofloxacin Diarrhea and Nausea And Vomiting    Patient Measurements: Height:  (160 cm) Weight: 201 lb (91.173 kg) IBW/kg (Calculated) : 52.4 Heparin Dosing Weight: 73 kg  Vital Signs: Temp: 98.4 F (36.9 C) (09/17 1505) Temp Source: Oral (09/17 1505) BP: 134/85 mmHg (09/17 1505) Pulse Rate: 91 (09/17 1505)  Labs:  Recent Labs  11/24/14 0420  HGB 11.6*  HCT 36.3  PLT 249  LABPROT 15.2  INR 1.18  CREATININE 0.86    Estimated Creatinine Clearance: 107.2 mL/min (by C-G formula based on Cr of 0.86).   Medical History: Past Medical History  Diagnosis Date  . Interstitial cystitis     pain from this was reason for starting opioids age 28.   . Chronic pelvic pain in female   . Headache(784.0)   . Pyelonephritis affecting pregnancy in first trimester July 2015  . Depression 2007  . Opioid dependence     to Rx opioids.  switched to Methadone after second child born in 04/2014  . Pneumonia 03/2014  . Seizures   . Stroke 05/2014    presented with right sided weakness.   . Numbness     right side  . Dizziness 07/18/14  . Confusion   . 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 Preeclampsia 2012  . Miscarriage 2005  . Non-compliance     with anticoagulant  . 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 Cerebral venous sinus thrombosis     Medications:  Prescriptions prior to admission  Medication Sig Dispense Refill Last Dose  . apixaban (ELIQUIS) 5 MG TABS tablet Take 1 tablet (5 mg total) by mouth 2 (two) times daily. 60 tablet 0 11/13/2014 at Unknown time  . EPINEPHrine 0.3 mg/0.3 mL IJ SOAJ injection Inject 0.3 mLs (0.3 mg total) into the muscle once. 1 Device 1 unknown  . FLUoxetine (PROZAC) 20 MG capsule Take 1 capsule (20 mg  total) by mouth daily. 30 capsule 3 11/13/2014 at Unknown time  . levETIRAcetam (KEPPRA) 500 MG tablet Take 1 tablet (500 mg total) by mouth 2 (two) times daily. 60 tablet 3 11/13/2014 at Unknown time  . Linaclotide (LINZESS) 145 MCG CAPS capsule Take 1 capsule (145 mcg total) by mouth daily. 30 capsule 0 11/13/2014 at Unknown time  . methadone (DOLOPHINE) 5 MG tablet Take 17 tablets (85 mg total) by mouth daily. Home medication continued 30 tablet 0 11/13/2014 at Unknown time  . polyethylene glycol (MIRALAX / GLYCOLAX) packet Take 17 g by mouth daily. 28 each 0 Past Month at Unknown time    Assessment: Patient has history of sagittal sinus thrombosis, cortical vein thrombosis, L parasaggital venous infarct and portal vein thrombosis. Pt has undergone extensive hypercoaguable workup all of which has been unrevealing. Pt has been on Eliquis pta, this will be held and transitioned to heparin in anticipation for surgery. In addition to hypercoaguable problems, the patient has stricture of her ileum, may undergo surgery (earliest would be Tuesday) in an attempt to diagnose and/or treat abdominal issues. Last dose of Eliquis was this morning at 1100, will begin heparin gtt at 2300.   Goal of Therapy:  Heparin level 0.3-0.7 units/ml Monitor platelets by anticoagulation protocol: Yes   Plan:  Heparin 1150 units/hr - begin at 2300 Daily  HL, CBC, aptt Check first aptt with AM labs Monitor for s/sx bleeding Stop heparin 6 hours prior to procedure, resume 12 hours post-op without bolus    Agapito Games, PharmD, BCPS Clinical Pharmacist Pager: [phone] 11/26/2014 4:28 PM

## 2014-11-26 NOTE — Progress Notes (Signed)
[facility]                                                                               24 y.o. 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  Ms. Schurman has been hospitalized 2x recently for similar symptoms.  She was feeling better after discharge 8/27 but still having cramping pain that was relieved with bowel movements.   9/4 she began having continuous abdominal pain not relieved with bowel movements. The pain is located thru out her abdomen. It stops her from eating. In the ER her lactic acid is 2.88, wbc is 11.6, CT abdomen/pelvis shows: circumferential wall thickening in the distal ileum. She was seen by GI and underwent colonoscopy with distal ileal stricture-possible crohbn's being entertained ANA was done 9/15 Gen surgery evlautating for possible surgeyr   Assessment & Plan     Abdominal Pain secondary to Enteritis-? Crohn's vs other such as ischemic colitis? - CT abdomen showed enteritis involving the distal ileum with adjacent inflammation DDx inflammatory, infectious or Crohn's disease.  ESR/CRP wnl. - on Flagyl (allergy to Cipro).Patient started on prednisone by GI in case the ileal stricture represents IBD, no significant improvement in symptoms yet - Colonoscopy 9/9 showed tight but not complete distal ileum stricture that appears ulcerated, inflamed with distal and about 10-15 cm proximal to the IC valve, biopsies appear to be showing active inflammation -Gen surgery consulted 9/14 and non-operative management advised as recent diagnosis Portal vein thrombosis 1 mo ago so risky to take off of [facility] for procedures otherwise -CT angio abd essentially non revealing 9/17 -discussions are on-going between Gen surg & GI - steroids cut back by GI 9/17 -flagyll  d/c 9/13  Lactic Acidosis - Likely due to acute process currently going on in her bowel. Resolved with 2L IVF.  Left Portal VeinThrombosis. - Diagnosed on 10/11/14, cont Eliquis. 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 Right sided weakness has resolved, no residual neurologic deficit per patient.  Seizure - Stable, continue Keppra 500 bid  Chronic Pain / Hx of interstitial cystitis Continue 85 mg methadone daily. -seen in [place] in the past by a Dr. Aldona Bar for this -apparently stable  Tobacco Abuse Nicotine Patch  History of depression Appears stable, denies any suicidal/homicidal ideation. Continue Prozac 20 daily.  TSH 4.3, free T4 0.9   Questionable hypercoagulable state;  Hx of miscarriage, stroke, multiple thrombosis.  -Sucpsicion raised as patient has relative-Mother-with Lupus -Also has cousin with Crohn's -Given non-response to Steroids, likely would be beneficial to determine other etiologies such as Lupus enteritis which although Rare might be a unifying diagnosis-I have ordered an ANA which is pending -has been worked up for paroxysmal NH, myeloprolif  disorder on admission 10/2014 by Dr. Alvy Bimler -Jak-2 was neg -Protein S was diminished as well that admission -Cardiolipin IGm was ?  - Unfortunately  missed her appt, hematology to see on d/c onciolo consulted by GI 9/17 for opinion  Mild leukopenia: unclear significance, resolved.  Code Status: full code   Family Communication: Discussed in detail with the patient, all imaging results, lab results explained to the patient    Disposition Plan: not medically ready  Time Spent in minutes  25 minutes  Procedures   colonoscopy  Consults   GI   DVT Prophylaxis eliquis  Medications  Scheduled Meds: . apixaban  5 mg Oral BID  . FLUoxetine  20 mg Oral Daily  . levETIRAcetam  500 mg Oral BID  . methadone  85 mg Oral Daily  . nicotine  21 mg Transdermal Daily  . pantoprazole  40 mg Oral Q0600  . predniSONE  40 mg Oral QAC breakfast   Continuous Infusions:  PRN Meds:.acetaminophen **OR** [DISCONTINUED] acetaminophen, alum & mag hydroxide-simeth, Influenza vac split quadrivalent PF, ondansetron **OR** ondansetron (ZOFRAN) IV, oxyCODONE-acetaminophen, sodium phosphate   Antibiotics   Anti-infectives    Start     Dose/Rate Route Frequency Ordered Stop   11/18/14 1400  metroNIDAZOLE (FLAGYL) tablet 500 mg  Status:  Discontinued     500 mg Oral 3 times per day 11/18/14 0939 11/22/14 1617   11/14/14 1530  metroNIDAZOLE (FLAGYL) IVPB 500 mg  Status:  Discontinued     500 mg 100 mL/hr over 60 Minutes Intravenous Every 8 hours 11/14/14 1429 11/18/14 0939        Subjective:   Looks better objectively Ambulating No sig pain passed small stool N/v btter   Objective:   Blood pressure 134/85, pulse 91, temperature 98.4 F (36.9 C), temperature source Oral, resp. rate 16, height 5' 3"  (1.6 m), weight 91.173 kg (201 lb), SpO2 98 %, not currently breastfeeding.  Wt Readings from Last 3 Encounters:  11/14/14 91.173 kg (201 lb)  11/06/14 91 kg (200 lb 9.9 oz)  10/07/14 77.111 kg (170 lb)     Intake/Output Summary (Last 24 hours) at 11/26/14 1520 Last data filed at 11/26/14 1511  Gross per 24 hour  Intake   1506 ml  Output      0 ml  Net   1506 ml    Exam  General: Alert and oriented x 3, NAD  HEENT:  PERRLA, EOMI,  Neck: Supple  CVS: S1 S2 clear, RRR  Respiratory: Clear to auscultation bilaterally  Abdomen: Soft, diffuse tenderness, nondistended, + bowel sounds    Data Review   Micro Results No results found for this or any previous visit (from the past 240  hour(s)).  Radiology Reports Dg Abd 1 View  11/15/2014   CLINICAL DATA:  Lower abdominal pain  EXAM: ABDOMEN - 1 VIEW  COMPARISON:  Abdominal radiographs October 14, 2014; CT abdomen and pelvis November 14, 2014  FINDINGS: There is moderate stool in the colon. There is no bowel dilatation or air-fluid level suggesting obstruction. No free air is seen on this supine examination. There are surgical clips in the left pelvic region.  IMPRESSION: Bowel gas pattern overall unremarkable.   Electronically Signed   By: Lowella Grip III M.D.   On: 11/15/2014 12:45   Ct Abdomen Pelvis W Contrast  11/14/2014   CLINICAL DATA:  24 year old female with acute abdominal and pelvic pain with vomiting for 1 day.  EXAM: CT ABDOMEN AND PELVIS WITH CONTRAST  TECHNIQUE: Multidetector CT imaging of the abdomen and pelvis was performed using the standard protocol following bolus administration of intravenous contrast.  CONTRAST:  187m OMNIPAQUE IOHEXOL 300 MG/ML  SOLN  COMPARISON:  11/03/2014 and prior CTs  FINDINGS: Lower chest:  Minimal basilar atelectasis noted.  Hepatobiliary: No significant hepatic or gallbladder abnormalities. There is no evidence of biliary dilatation.  Pancreas: Unremarkable  Spleen: Unremarkable  Adrenals/Urinary Tract: Kidneys, adrenal glands and bladder are unremarkable.  Stomach/Bowel: There is circumferential wall thickening of a moderate length segment distal ileum with adjacent inflammation and small amount of interloop fluid. There is mild distention of small bowel loops just proximal to the moderate segment wall thickening. There is no evidence of abscess or pneumoperitoneum. The appendix is normal.  Vascular/Lymphatic: Prominent mesenteric lymph nodes are likely reactive. There is no evidence of vascular abnormality.  Reproductive: Uterus and adnexal regions are unremarkable.  Other: A trace amount of free pelvic fluid is noted.  Musculoskeletal: No acute or suspicious abnormalities identified.   IMPRESSION: Enteritis involving the distal ileum with adjacent inflammation and small amount of fluid. This likely is inflammatory, infectious or Crohn's disease. No evidence of pneumoperitoneum or abscess.   Electronically Signed   By: JMargarette CanadaM.D.   On: 11/14/2014 10:28   Ct Abdomen Pelvis W Contrast  11/02/2014   CLINICAL DATA:  Abdominal pain since leaving the hospital a few weeks ago. 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  EXAM: CT ABDOMEN AND PELVIS WITH CONTRAST  TECHNIQUE: Multidetector CT imaging of the abdomen and pelvis was performed using the standard protocol following bolus administration of intravenous contrast.  CONTRAST:  1086mOMNIPAQUE IOHEXOL 300 MG/ML  SOLN  COMPARISON:  10/11/2014  FINDINGS: 3 mm nodule in the left lung base without change since prior study.  Small focal fatty infiltration in the liver adjacent to the falciform ligament. Tiny sub cm low-attenuation lesion in the medial segment left lobe unchanged since prior study. This too small to characterize but probably represents a cyst or hemangioma. No other focal liver lesions. Gallbladder, spleen, pancreas, adrenal glands, kidneys, abdominal aorta, inferior vena cava, and retroperitoneal lymph nodes are unremarkable. Stomach, small bowel, and colon are not abnormally distended. Residual thickening of the wall of the distal ileum. This could represent residua of previous vascular compromise or inflammatory change. Visualized portions of the superior mesenteric artery and vein appear patent. No significant residual portal venous thrombosis. Small amount of free fluid in the lower abdominal mesenteric in the area of thickened bowel. No pneumatosis or portal venous gas. No free air. Mild prominence of mesenteric lymph nodes, probably reactive.  Pelvis: Small amount of free fluid in the pelvis could be reactive or physiologic. Uterus and ovaries are not enlarged. Mild bladder wall thickening may be due to under  distention or cystitis. Appendix is normal. No pelvic mass or lymphadenopathy. No destructive bone lesions.  IMPRESSION: Residual bowel wall thickening in the low abdomen with small amount of adjacent free fluid. This could represent residual vascular compromise or inflammatory process. The previous portal venous thrombosis has resolved.   Electronically Signed   By: WiLucienne Capers.D.   On: 11/02/2014 23:59   Dg Abd Portable 1v  11/22/2014   CLINICAL DATA:  Right lower quadrant pain with associated mild generalized abdominal pain.  EXAM: PORTABLE ABDOMEN - 1 VIEW  COMPARISON:  11/17/2014  FINDINGS: The bowel gas pattern is nonobstructive. There is a long tubular radiolucent structure which seen to arise from the cecum,  with associated surrounding soft tissue thickening.  No radio-opaque calculi or other significant radiographic abnormality are seen. Moderate amount of stool throughout the colon. No radiographic evidence of organomegaly. Osseous structures are grossly normal.  IMPRESSION: Nonobstructive bowel gas pattern with evidence of constipation.  Long tubular structure which seems to arise from wall are overlie the distal cecum. This may represent a gas containing thickened distal ileum, or abnormal appendix. In correlation to CT of the abdomen dated 11/14/2014, there is no evidence of appendicitis, and the distal ileum appears inflamed. Therefore, this radiographic finding likely correlates to the segment of abnormal ileum. Radiographic or CT follow-up is recommended.  These results will be called to the ordering clinician or representative by the Radiologist Assistant, and communication documented in the PACS or zVision Dashboard.   Electronically Signed   By: Fidela Salisbury M.D.   On: 11/22/2014 12:15   Ct Angio Abd/pel W/ And/or W/o  11/24/2014   CLINICAL DATA:  History of Crohn's disease. Evaluate for portal vein thrombosis.  EXAM: CT ANGIOGRAPHY ABDOMEN AND PELVIS WITH CONTRAST AND WITHOUT  CONTRAST  TECHNIQUE: Multidetector CT imaging of the abdomen and pelvis was performed using the standard protocol during bolus administration of intravenous contrast. Multiplanar reconstructed images including MIPs were obtained and reviewed to evaluate the vascular anatomy.  CONTRAST:  18m OMNIPAQUE IOHEXOL 350 MG/ML SOLN  COMPARISON:  CT abdomen pelvis - 11/14/2014  FINDINGS: Vascular Findings:  Abdominal aorta: There is no atherosclerotic plaque within a normal caliber abdominal aorta. No abdominal aortic dissection No aortic wall thickening or periaortic stranding.  Celiac artery: Widely patent.  Conventional branching pattern.  SMA: Widely patent. 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 The distal tributaries of the SMA are widely patent without discrete intraluminal filling defect to suggest distal embolism.  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 The dominant cranial right-sided renal artery is widely patent without a hemodynamically significant stenosis. No vessel irregularity to suggest FMD.  Left Renal artery: Duplicated; there is an accessory left renal artery which supplies the inferior pole of the left kidney. The dominant left-sided renal artery is widely patent without a hemodynamically significant stenosis. No vessel irregularity to suggest FMD.  IMA: Widely patent without hemodynamically significant stenosis.  Pelvic vasculature: The bilateral common, external and internal iliac arteries are of normal caliber and widely patent without hemodynamically significant stenosis.  Venous Findings: The portal vein is widely patent. The IVC and bilateral pelvic venous systems are widely patent.  Review of the MIP images confirms the above findings.    --------------------------------------------------------------------------------  Nonvascular Findings:  Normal hepatic contour. There is a minimal amount of focal fatty infiltration adjacent to the fissure for ligamentum teres. No discrete hepatic lesions. Normal appearance of the gallbladder. No intra extrahepatic biliary duct dilatation. No ascites.  There is symmetric enhancement and excretion of the bilateral kidneys. No definite renal stones in this postcontrast examination. No discrete renal lesions. No urinary obstruction or perinephric stranding. Normal appearance of the bilateral adrenal glands, pancreas and spleen.  Note is again made of short segment bowel wall thickening involving an approximately 20 cm segment of the distal ileum though sparing the terminal ileum (representative images 57 through 62, series 11). This finding is associated with mild upstream distension of the adjacent distal ileum (representative image 42,  series 11) but not resulting in enteric obstruction. The amount of previously noted adjacent mesenteric stranding and reactive adenopathy within the adjacent mesenteries has decreased in the interval. No evidence of perforation or definable/drainable fluid collection. No pneumoperitoneum, pneumatosis or portal venous gas.  Large colonic stool burden. Normal appearance of the retrocecal appendix.  No bulky retroperitoneal, mesenteric, pelvic or inguinal lymphadenopathy.  Normal appearance of the pelvic organs. No discrete adnexal lesion. Normal appearance of the urinary bladder given underdistention. No free fluid in the pelvic cul-de-sac.  Limited visualization of lower thorax demonstrates minimal subsegmental atelectasis within the image caudal aspect the left lower lobe. No focal airspace opacities. No pleural effusion. Normal heart size. No pericardial effusion.  No acute or aggressive osseous abnormalities. Note is made of mild bilateral symmetric sacroiliitis potentially  attributable to provided history of Crohn's disease.  Tiny mesenteric fat containing periumbilical hernia. There is a minimal amount of soft tissue stranding about the midline of the low back.  IMPRESSION: Vascular Impression:  1. Unremarkable CTA of the abdomen and pelvis. No evidence of mesenteric ischemia or portal vein thrombosis. 2. 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 Nonvascular Impression:  1. Minimally improved bowel wall thickening involving an approximately 20 cm segment of the distal ileum with sparing of the TI and cecum, findings compatible with provided history of Crohn's disease. There is mild upstream distention of the adjacent small bowel without evidence of enteric obstruction. No evidence of perforation or definable/drainable fluid collection. 2. Suspected mild bilateral symmetric sacroiliitis, potentially attributable to provided history of Crohn's disease.   Electronically Signed   By: Sandi Mariscal M.D.   On: 11/24/2014 16:37   Ct Angio Abd/pel W/ And/or W/o  11/03/2014   CLINICAL DATA:  Severe left lower quadrant pain, and nausea. Patient with portal venous and cerebral venous thrombosis.  EXAM: CT ANGIOGRAPHY ABDOMEN AND PELVIS WITH CONTRAST AND WITHOUT CONTRAST  TECHNIQUE: Multidetector CT imaging of the abdomen and pelvis was performed using the standard protocol during bolus administration of intravenous contrast. Multiplanar reconstructed images including MIPs were obtained and reviewed to evaluate the vascular anatomy.  CONTRAST:  100 cc Omnipaque 350.  COMPARISON:  CT of the abdomen pelvis dated 11/02/2014  FINDINGS: Arterial findings  Aorta  Normal in caliber, without filling defects or stenosis.  Celiac axis  Normal in caliber, without filling defects or stenosis.  Superior mesenteric  Normal in caliber, without filling defects or stenosis.  Left  renal  Normal in caliber, without filling defects or stenosis. Accessory left renal artery is noted.  Right renal  Normal in caliber, without filling defects or stenosis.  Inferior mesenteric  Normal in caliber, without filling defects or stenosis.  Left iliac  Normal in caliber, without filling defects or stenosis.  Right iliac  Normal in caliber, without filling defects or stenosis.  Venous findings  No evidence of portal venous thrombosis.  Review of the MIP images confirms the above findings.  Nonvascular findings  There is mesenteric stranding and small amount of free fluid within the mid left abdomen. There is also a long segment of bowel thickening of the distal third of the ileum. The distal most ileum is noted to be distended and fluid-filled with maximum diameter of 2.7 cm. The terminal ileum is prominent. The appendix is normal. The remainder of the upper GI tract and colon are normal. Multiple sub cm rounded mesenteric lymph nodes are seen  within the same area of the abdomen. There is a tiny amount of free fluid, tracking along the left pericolic gutter. There is a small amount of free fluid within the pelvis as well.  Focal fatty infiltration along the falciform ligament of the liver is noted, otherwise the liver appears normal. The spleen, pancreas, bilateral adrenal glands, bilateral kidneys are normal. Vicarious excretion of contrast within the otherwise normal gallbladder is noted.  IMPRESSION: No evidence of a arterial or venous thrombosis.  Increased mesenteric stranding with interspersed shotty mesenteric lymph nodes within the mid left abdomen, in the area of long segment of thickening of the distal ileum. Thickening of the terminal ileum and a focal distention of up to 2.7 cm of the distal ileum leading to it. These findings may represent infectious, inflammatory or ischemic bowel changes, with associated reactive lymphadenopathy. Alternatively mesenteric adenitis is of consideration.    Electronically Signed   By: Fidela Salisbury M.D.   On: 11/03/2014 11:42    CBC  Recent Labs Lab 11/20/14 1230 11/23/14 0340 11/24/14 0420  WBC 5.7 5.9 7.8  HGB 11.7* 12.4 11.6*  HCT 37.3 39.0 36.3  PLT 237 249 249  MCV 73.7* 73.3* 73.9*  MCH 23.1* 23.3* 23.6*  MCHC 31.4 31.8 32.0  RDW 26.3* 25.8* 25.9*    Chemistries   Recent Labs Lab 11/20/14 0402 11/24/14 0420  NA 139 137  K 4.6 3.2*  CL 108 103  CO2 18* 26  GLUCOSE 105* 123*  BUN 9 10  CREATININE 0.66 0.86  CALCIUM 9.3 9.0  AST  --  25  ALT  --  24  ALKPHOS  --  47  BILITOT  --  0.5   ------------------------------------------------------------------------------------------------------------------ estimated creatinine clearance is 107.2 mL/min (by C-G formula based on Cr of 0.86). ------------------------------------------------------------------------------------------------------------------ No results for input(s): HGBA1C in the last 72 hours. ------------------------------------------------------------------------------------------------------------------ No results for input(s): CHOL, HDL, LDLCALC, TRIG, CHOLHDL, LDLDIRECT in the last 72 hours. ------------------------------------------------------------------------------------------------------------------ No results for input(s): TSH, T4TOTAL, T3FREE, THYROIDAB in the last 72 hours.  Invalid input(s): FREET3 ------------------------------------------------------------------------------------------------------------------ No results for input(s): VITAMINB12, FOLATE, FERRITIN, TIBC, IRON, RETICCTPCT in the last 72 hours.  Coagulation profile  Recent Labs Lab 11/24/14 0420  INR 1.18    No results for input(s): DDIMER in the last 72 hours.  Cardiac Enzymes No results for input(s): CKMB, TROPONINI, MYOGLOBIN in the last 168 hours.  Invalid input(s):  CK ------------------------------------------------------------------------------------------------------------------ Invalid input(s): POCBNP  No results for input(s): GLUCAP in the last 72 hours.  Verneita Griffes, MD [facility] [phone]

## 2014-11-26 NOTE — Consult Note (Signed)
[facility]  [phone] [phone]     ID: Victoria Alvarez DOB: [birth year]  MR#: [id number]  JYN#:[id number]  Patient Care Team: Dessa Phi, MD as PCP - General (Family Medicine) PCP: Lora Paula, MD GYN: SU:  OTHER MD: Judith Blonder, Artis Delay  CHIEF COMPLAINT: hypercoagulable state, NOS  CURRENT TREATMENT: apixban/ Eliquis   HISTORY OF PRESENT HISTORY: Victoria Alvarez was initially seen by hematology (Dr Bertis Ruddy) 10/12/2014. Her intake note summarizes the pertinent history to that point:  "Victoria Alvarez is a 24 y.o. 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 Denies lower extremity pain, swelling, tenderness or erythema. Denies pre-syncopal episodes, palpitations or hemoptysis. Denies any bleeding issues such as epistaxis, hematemesis, hematuria. She had blood in her stools 5 days prior for which she had placed Eliquis on hold. She is also having some vaginal bleeding despite having finished her period a week ago. She reports a history of seizures. Denies prior diagnosis of cancer. Reports history of tobacco, alcohol. She was on Methadone on admission. She denies over the counter supplements. Denies taking birth control pills. Her initial thrombotic event occurred 4 weeks post partum (she is now 5 months post C section). She had a history of preeclampsia in 2004 and miscarriage in 2005. She had an older 53 year old child. There is no family history of blood clots or miscarriages. She is obese. Denies sedentary lifestyle. No recent long distance trips. Patient states that has never had a hematological evaluation prior to this admission. Never had a bone  marrow biopsy. --  CT of the abdomen On 7/28 revealed occlusive left portal vein thrombosis with patent main portal, superior mesenteric and splenic veins There was mild thickening of the terminal ileum and cecum with some surrounding inflammation. No evidence of arterial occlusion, perforation or abscess. No echo performed to date.Hypercoagulable workup not yet ordered."  Dr Bertis Ruddy proceeded to do a thorough hypercoagulable workup which, in short, showed no clear inherited or acquired cause for the patient's coagulopathy. This included AT, protein C&S, LAC, beta-2-microglobilin and anticardiolipin studies, homocysteine, factor V Leiden and Prothrombin gene mutaion studies, JAK 2 and screen for PNH. There was a mild decrease in protein S activity but not total, possibly reflecting patient's initial treatment with warfarin; there was indeterminate reading on IgM anticardiolipins-- neither is likely to be of significance.  The patient did not show for her follow up appointment with Dr Bertis Ruddy 11/01/2014  INTERVAL HISTORY: She was seen in the ED 11/02/2014 and admitted 11/03/2014 with worsening abdominal pain. Ct abd/pelvis 11/02/2014 showed the prior PV thrombus had cleared. There was thickening of the distal ileum, and CT/angio of the abd/pelvis next day confirmed thickening of the distal 1/3 of the ileum, focally to 2.7 cm, which was fluid-filled and associated with mesenteric stranding. Colonoscopy 11/16/2014 showed a distended distal ileum but the prep was poor. Repeat colonoscopy 11/18/2014 showed an ulcerated stricture in the distal ileum 11 cm proximal to the ICvalve. Pathology ([facility] [phone]) showed chronic active ileitis. Neither Crohn's nor ischemic bowel is ruled out. A trial of therapy with prednisone has not resulted in any clinical improvement, though repeat CT/angio/abd 11/24/2014 suggests mild improvement  At present surgery is being considered for pain control and  for definitive  diagnosis  REVIEW OF SYSTEMS: She still has significant abdominal pain, controlled on the current medications. BMs alternate between diarrhea all day and needing laxatives. There has been no bruising or overt bleeding. She is very frustrated with the current situation and is hoping for a resolution soon.  PAST MEDICAL HISTORY: Past Medical History  Diagnosis Date  . Interstitial cystitis     pain from this was reason for starting opioids age 2.   . Chronic pelvic pain in female   . Headache(784.0)   . Pyelonephritis affecting pregnancy in first trimester July 2015  . Depression 2007  . Opioid dependence     to Rx opioids.  switched to Methadone after second child born in 04/2014  . Pneumonia 03/2014  . Seizures   . Stroke 05/2014    presented with right sided weakness.   . Numbness     right side  . Dizziness 07/18/14  . Confusion   . 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 Preeclampsia 2012  . Miscarriage 2005  . Non-compliance     with anticoagulant  . 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 Cerebral venous sinus thrombosis     PAST SURGICAL HISTORY: Past Surgical History  Procedure Laterality Date  . Vagina surgery  ~ 2011    benign vaginal tumor removed at [facility].   . Cesarean section N/A 05/06/2014    Procedure: CESAREAN SECTION;  Surgeon: Levie Heritage, DO;  Location: [facility];  Service: Obstetrics;  Laterality: N/A;  . Inguinal hernia repair Left 2008  . Colonoscopy with propofol N/A 11/18/2014    Procedure: COLONOSCOPY WITH PROPOFOL;  Surgeon: Rachael Fee, MD;  Location: [facility] ENDOSCOPY;  Service: Endoscopy;  Laterality: N/A;    FAMILY HISTORY Family History  Problem Relation Age of Onset  . Diabetes Mother   . Heart disease Mother   . Hypertension Father   . Diabetes Maternal Grandmother   . Hypertension Maternal Grandmother   . Stroke Maternal Grandmother   . Diabetes Maternal Grandfather   . Hypertension Maternal Grandfather   . Diabetes Paternal Grandmother   .  Diabetes Paternal Grandfather   The patient's father is 55, s/p CABG, not a smoker. The patient's mother died from an MI at age 56 (did smoke). The patient's mother had a history of multiple CVA's. The patient has 1 brother, 3 sisters. There is no other history of clotting/ bleeding problems in the family to her knowledge  SOCIAL HISTORY:  The patient and her fiance Nate are from [place] and [place]--they have no relatives in this area. The patient formerly worked as a Lawyer. Nate works for an [employer]. A neighbor cares for their 37 y/o daughter and they pay a babysitter to care for the 6 month old son.      HEALTH MAINTENANCE: Social History  Substance Use Topics  . Smoking status: Current Every Day Smoker -- 0.50 packs/day for 8 years    Types: Cigarettes  . Smokeless tobacco: Never Used     Comment: cutting back  . Alcohol Use: No     Allergies  Allergen Reactions  . Ativan [Lorazepam] Other (See Comments)    " I don't remember anything."  . Bee Venom Swelling  . Ciprofloxacin Diarrhea and Nausea And Vomiting    Current Facility-Administered Medications  Medication Dose Route Frequency Provider Last Rate Last Dose  . acetaminophen (TYLENOL) tablet 650 mg  650 mg Oral Q6H PRN Stephani Police, PA-C      .  alum & mag hydroxide-simeth (MAALOX/MYLANTA) 200-200-20 MG/5ML suspension 15 mL  15 mL Oral Q4H PRN Rhetta Mura, MD   15 mL at 11/24/14 2312  . apixaban (ELIQUIS) tablet 5 mg  5 mg Oral BID Stephani Police, PA-C   5 mg at 11/26/14 1059  . FLUoxetine (PROZAC) capsule 20 mg  20 mg Oral Daily Stephani Police, PA-C   20 mg at 11/26/14 1100  . Influenza vac split quadrivalent PF (FLUARIX) injection 0.5 mL  0.5 mL Intramuscular Prior to discharge Ripudeep K Rai, MD      . levETIRAcetam (KEPPRA) tablet 500 mg  500 mg Oral BID Stephani Police, PA-C   500 mg at 11/26/14 1100  . methadone (DOLOPHINE) tablet 85 mg  85 mg Oral Daily Stephani Police, PA-C   85 mg at 11/26/14 1100  . nicotine  (NICODERM CQ - dosed in mg/24 hours) patch 21 mg  21 mg Transdermal Daily Stephani Police, PA-C   21 mg at 11/26/14 1059  . ondansetron (ZOFRAN) tablet 4 mg  4 mg Oral Q6H PRN Stephani Police, PA-C   4 mg at 11/25/14 1522   Or  . ondansetron (ZOFRAN) injection 4 mg  4 mg Intravenous Q6H PRN Stephani Police, PA-C   4 mg at 11/26/14 1418  . oxyCODONE-acetaminophen (PERCOCET/ROXICET) 5-325 MG per tablet 1-2 tablet  1-2 tablet Oral Q4H PRN Ripudeep Jenna Luo, MD   2 tablet at 11/26/14 1059  . pantoprazole (PROTONIX) EC tablet 40 mg  40 mg Oral Q0600 Dianah Field, PA-C   40 mg at 11/26/14 0518  . predniSONE (DELTASONE) tablet 40 mg  40 mg Oral QAC breakfast Dianah Field, PA-C   40 mg at 11/26/14 1610  . sodium phosphate (FLEET) 7-19 GM/118ML enema 1 enema  1 enema Rectal Daily PRN Meredith Pel, NP        OBJECTIVE: young White woman exmained in bed Filed Vitals:   11/26/14 0512  BP: 106/60  Pulse: 51  Temp: 98.5 F (36.9 C)  Resp: 16     Body mass index is 35.61 kg/(m^2).    ECOG FS:1 - Symptomatic but completely ambulatory   LAB RESULTS:  CMP     Component Value Date/Time   NA 137 11/24/2014 0420   K 3.2* 11/24/2014 0420   CL 103 11/24/2014 0420   CO2 26 11/24/2014 0420   GLUCOSE 123* 11/24/2014 0420   BUN 10 11/24/2014 0420   CREATININE 0.86 11/24/2014 0420   CREATININE 0.81 07/12/2014 1717   CALCIUM 9.0 11/24/2014 0420   PROT 5.0* 11/24/2014 0420   ALBUMIN 3.4* 11/24/2014 0420   AST 25 11/24/2014 0420   ALT 24 11/24/2014 0420   ALKPHOS 47 11/24/2014 0420   BILITOT 0.5 11/24/2014 0420   GFRNONAA >60 11/24/2014 0420   GFRNONAA >89 07/12/2014 1717   GFRAA >60 11/24/2014 0420   GFRAA >89 07/12/2014 1717    INo results found for: SPEP, UPEP  Lab Results  Component Value Date   WBC 7.8 11/24/2014   NEUTROABS 4.5 11/19/2014   HGB 11.6* 11/24/2014   HCT 36.3 11/24/2014   MCV 73.9* 11/24/2014   PLT 249 11/24/2014    @LASTCHEMISTRY @  No results found for:  LABCA2  No components found for: LABCA125   Recent Labs Lab 11/24/14 0420  INR 1.18    Urinalysis    Component Value Date/Time   COLORURINE YELLOW 11/14/2014 1211   APPEARANCEUR CLEAR 11/14/2014 1211   LABSPEC >  1.046* 11/14/2014 1211   PHURINE 5.5 11/14/2014 1211   GLUCOSEU NEGATIVE 11/14/2014 1211   HGBUR NEGATIVE 11/14/2014 1211   BILIRUBINUR NEGATIVE 11/14/2014 1211   KETONESUR NEGATIVE 11/14/2014 1211   PROTEINUR NEGATIVE 11/14/2014 1211   UROBILINOGEN 0.2 11/14/2014 1211   NITRITE NEGATIVE 11/14/2014 1211   LEUKOCYTESUR NEGATIVE 11/14/2014 1211    STUDIES: Dg Abd 1 View  11/15/2014   CLINICAL DATA:  Lower abdominal pain  EXAM: ABDOMEN - 1 VIEW  COMPARISON:  Abdominal radiographs October 14, 2014; CT abdomen and pelvis November 14, 2014  FINDINGS: There is moderate stool in the colon. There is no bowel dilatation or air-fluid level suggesting obstruction. No free air is seen on this supine examination. There are surgical clips in the left pelvic region.  IMPRESSION: Bowel gas pattern overall unremarkable.   Electronically Signed   By: Bretta Bang III M.D.   On: 11/15/2014 12:45   Ct Abdomen Pelvis W Contrast  11/14/2014   CLINICAL DATA:  24 year old female with acute abdominal and pelvic pain with vomiting for 1 day.  EXAM: CT ABDOMEN AND PELVIS WITH CONTRAST  TECHNIQUE: Multidetector CT imaging of the abdomen and pelvis was performed using the standard protocol following bolus administration of intravenous contrast.  CONTRAST:  OMNIPAQUE IOHEXOL 300 MG/ML  SOLN  COMPARISON:  11/03/2014 and prior CTs  FINDINGS: Lower chest:  Minimal basilar atelectasis noted.  Hepatobiliary: No significant hepatic or gallbladder abnormalities. There is no evidence of biliary dilatation.  Pancreas: Unremarkable  Spleen: Unremarkable  Adrenals/Urinary Tract: Kidneys, adrenal glands and bladder are unremarkable.  Stomach/Bowel: There is circumferential wall thickening of a moderate length  segment distal ileum with adjacent inflammation and small amount of interloop fluid. There is mild distention of small bowel loops just proximal to the moderate segment wall thickening. There is no evidence of abscess or pneumoperitoneum. The appendix is normal.  Vascular/Lymphatic: Prominent mesenteric lymph nodes are likely reactive. There is no evidence of vascular abnormality.  Reproductive: Uterus and adnexal regions are unremarkable.  Other: A trace amount of free pelvic fluid is noted.  Musculoskeletal: No acute or suspicious abnormalities identified.  IMPRESSION: Enteritis involving the distal ileum with adjacent inflammation and small amount of fluid. This likely is inflammatory, infectious or Crohn's disease. No evidence of pneumoperitoneum or abscess.   Electronically Signed   By: Harmon Pier M.D.   On: 11/14/2014 10:28   Ct Abdomen Pelvis W Contrast  11/02/2014   CLINICAL DATA:  Abdominal pain since leaving the hospital a few weeks ago. 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  EXAM: CT ABDOMEN AND PELVIS WITH CONTRAST  TECHNIQUE: Multidetector CT imaging of the abdomen and pelvis was performed using the standard protocol following bolus administration of intravenous contrast.  CONTRAST:  OMNIPAQUE IOHEXOL 300 MG/ML  SOLN  COMPARISON:  10/11/2014  FINDINGS: 3 mm nodule in the left lung base without change since prior study.  Small focal fatty infiltration in the liver adjacent to the falciform ligament. Tiny sub cm low-attenuation lesion in the medial segment left lobe unchanged since prior study. This too small to characterize but probably represents a cyst or hemangioma. No other focal liver lesions. Gallbladder, spleen, pancreas, adrenal glands, kidneys, abdominal aorta, inferior vena cava, and retroperitoneal lymph nodes are unremarkable. Stomach, small bowel, and colon are not abnormally distended. Residual thickening of the wall of the distal ileum. This could represent residua of  previous vascular compromise or inflammatory change. Visualized portions of the superior mesenteric artery  and vein appear patent. No significant residual portal venous thrombosis. Small amount of free fluid in the lower abdominal mesenteric in the area of thickened bowel. No pneumatosis or portal venous gas. No free air. Mild prominence of mesenteric lymph nodes, probably reactive.  Pelvis: Small amount of free fluid in the pelvis could be reactive or physiologic. Uterus and ovaries are not enlarged. Mild bladder wall thickening may be due to under distention or cystitis. Appendix is normal. No pelvic mass or lymphadenopathy. No destructive bone lesions.  IMPRESSION: Residual bowel wall thickening in the low abdomen with small amount of adjacent free fluid. This could represent residual vascular compromise or inflammatory process. The previous portal venous thrombosis has resolved.   Electronically Signed   By: Burman Nieves M.D.   On: 11/02/2014 23:59   Dg Abd Portable 1v  11/22/2014   CLINICAL DATA:  Right lower quadrant pain with associated mild generalized abdominal pain.  EXAM: PORTABLE ABDOMEN - 1 VIEW  COMPARISON:  11/17/2014  FINDINGS: The bowel gas pattern is nonobstructive. There is a long tubular radiolucent structure which seen to arise from the cecum, with associated surrounding soft tissue thickening.  No radio-opaque calculi or other significant radiographic abnormality are seen. Moderate amount of stool throughout the colon. No radiographic evidence of organomegaly. Osseous structures are grossly normal.  IMPRESSION: Nonobstructive bowel gas pattern with evidence of constipation.  Long tubular structure which seems to arise from wall are overlie the distal cecum. This may represent a gas containing thickened distal ileum, or abnormal appendix. In correlation to CT of the abdomen dated 11/14/2014, there is no evidence of appendicitis, and the distal ileum appears inflamed. Therefore, this  radiographic finding likely correlates to the segment of abnormal ileum. Radiographic or CT follow-up is recommended.  These results will be called to the ordering clinician or representative by the Radiologist Assistant, and communication documented in the PACS or zVision Dashboard.   Electronically Signed   By: Ted Mcalpine M.D.   On: 11/22/2014 12:15   Ct Angio Abd/pel W/ And/or W/o  11/24/2014   CLINICAL DATA:  History of Crohn's disease. Evaluate for portal vein thrombosis.  EXAM: CT ANGIOGRAPHY ABDOMEN AND PELVIS WITH CONTRAST AND WITHOUT CONTRAST  TECHNIQUE: Multidetector CT imaging of the abdomen and pelvis was performed using the standard protocol during bolus administration of intravenous contrast. Multiplanar reconstructed images including MIPs were obtained and reviewed to evaluate the vascular anatomy.  CONTRAST:  OMNIPAQUE IOHEXOL 350 MG/ML SOLN  COMPARISON:  CT abdomen pelvis - 11/14/2014  FINDINGS: Vascular Findings:  Abdominal aorta: There is no atherosclerotic plaque within a normal caliber abdominal aorta. No abdominal aortic dissection No aortic wall thickening or periaortic stranding.  Celiac artery: Widely patent.  Conventional branching pattern.  SMA: Widely patent. 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 The distal tributaries of the SMA are widely patent without discrete intraluminal filling defect to suggest distal embolism.  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 The dominant cranial right-sided renal artery is widely patent without a hemodynamically significant stenosis. No vessel irregularity to suggest FMD.  Left Renal artery: Duplicated; there is an accessory left renal artery which supplies the inferior pole of the left kidney. The dominant left-sided renal  artery is widely patent without a hemodynamically significant stenosis. No vessel irregularity  to suggest FMD.  IMA: Widely patent without hemodynamically significant stenosis.  Pelvic vasculature: The bilateral common, external and internal iliac arteries are of normal caliber and widely patent without hemodynamically significant stenosis.  Venous Findings: The portal vein is widely patent. The IVC and bilateral pelvic venous systems are widely patent.  Review of the MIP images confirms the above findings.   --------------------------------------------------------------------------------  Nonvascular Findings:  Normal hepatic contour. There is a minimal amount of focal fatty infiltration adjacent to the fissure for ligamentum teres. No discrete hepatic lesions. Normal appearance of the gallbladder. No intra extrahepatic biliary duct dilatation. No ascites.  There is symmetric enhancement and excretion of the bilateral kidneys. No definite renal stones in this postcontrast examination. No discrete renal lesions. No urinary obstruction or perinephric stranding. Normal appearance of the bilateral adrenal glands, pancreas and spleen.  Note is again made of short segment bowel wall thickening involving an approximately 20 cm segment of the distal ileum though sparing the terminal ileum (representative images 57 through 62, series 11). This finding is associated with mild upstream distension of the adjacent distal ileum (representative image 42, series 11) but not resulting in enteric obstruction. The amount of previously noted adjacent mesenteric stranding and reactive adenopathy within the adjacent mesenteries has decreased in the interval. No evidence of perforation or definable/drainable fluid collection. No pneumoperitoneum, pneumatosis or portal venous gas.  Large colonic stool burden. Normal appearance of the retrocecal appendix.  No bulky retroperitoneal, mesenteric, pelvic or inguinal lymphadenopathy.  Normal  appearance of the pelvic organs. No discrete adnexal lesion. Normal appearance of the urinary bladder given underdistention. No free fluid in the pelvic cul-de-sac.  Limited visualization of lower thorax demonstrates minimal subsegmental atelectasis within the image caudal aspect the left lower lobe. No focal airspace opacities. No pleural effusion. Normal heart size. No pericardial effusion.  No acute or aggressive osseous abnormalities. Note is made of mild bilateral symmetric sacroiliitis potentially attributable to provided history of Crohn's disease.  Tiny mesenteric fat containing periumbilical hernia. There is a minimal amount of soft tissue stranding about the midline of the low back.  IMPRESSION: Vascular Impression:  1. Unremarkable CTA of the abdomen and pelvis. No evidence of mesenteric ischemia or portal vein thrombosis. 2. 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 Nonvascular Impression:  1. Minimally improved bowel wall thickening involving an approximately 20 cm segment of the distal ileum with sparing of the TI and cecum, findings compatible with provided history of Crohn's disease. There is mild upstream distention of the adjacent small bowel without evidence of enteric obstruction. No evidence of perforation or definable/drainable fluid collection. 2. Suspected mild bilateral symmetric sacroiliitis, potentially attributable to provided history of Crohn's disease.   Electronically Signed   By: Simonne Come M.D.   On: 11/24/2014 16:37   Ct Angio Abd/pel W/ And/or W/o  11/03/2014   CLINICAL DATA:  Severe left lower quadrant pain, and nausea. Patient with portal venous and cerebral venous thrombosis.  EXAM: CT ANGIOGRAPHY ABDOMEN AND PELVIS WITH CONTRAST AND WITHOUT CONTRAST  TECHNIQUE: Multidetector CT imaging of the abdomen and pelvis was performed using the standard  protocol during bolus administration of intravenous contrast. Multiplanar reconstructed images including MIPs were obtained and reviewed to evaluate the vascular anatomy.  CONTRAST:  100 cc Omnipaque 350.  COMPARISON:  CT of the abdomen pelvis dated 11/02/2014  FINDINGS: Arterial findings  Aorta  Normal in caliber, without filling defects  or stenosis.  Celiac axis  Normal in caliber, without filling defects or stenosis.  Superior mesenteric  Normal in caliber, without filling defects or stenosis.  Left renal  Normal in caliber, without filling defects or stenosis. Accessory left renal artery is noted.  Right renal  Normal in caliber, without filling defects or stenosis.  Inferior mesenteric  Normal in caliber, without filling defects or stenosis.  Left iliac  Normal in caliber, without filling defects or stenosis.  Right iliac  Normal in caliber, without filling defects or stenosis.  Venous findings  No evidence of portal venous thrombosis.  Review of the MIP images confirms the above findings.  Nonvascular findings  There is mesenteric stranding and small amount of free fluid within the mid left abdomen. There is also a long segment of bowel thickening of the distal third of the ileum. The distal most ileum is noted to be distended and fluid-filled with maximum diameter of 2.7 cm. The terminal ileum is prominent. The appendix is normal. The remainder of the upper GI tract and colon are normal. Multiple sub cm rounded mesenteric lymph nodes are seen within the same area of the abdomen. There is a tiny amount of free fluid, tracking along the left pericolic gutter. There is a small amount of free fluid within the pelvis as well.  Focal fatty infiltration along the falciform ligament of the liver is noted, otherwise the liver appears normal. The spleen, pancreas, bilateral adrenal glands, bilateral kidneys are normal. Vicarious excretion of contrast within the otherwise normal gallbladder is noted.  IMPRESSION: No  evidence of a arterial or venous thrombosis.  Increased mesenteric stranding with interspersed shotty mesenteric lymph nodes within the mid left abdomen, in the area of long segment of thickening of the distal ileum. Thickening of the terminal ileum and a focal distention of up to 2.7 cm of the distal ileum leading to it. These findings may represent infectious, inflammatory or ischemic bowel changes, with associated reactive lymphadenopathy. Alternatively mesenteric adenitis is of consideration.   Electronically Signed   By: Ted Mcalpine M.D.   On: 11/03/2014 11:42    ASSESSMENT: 24 y.o. 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 Perhaps the inflammatory process in the ileum is cause and not effect. At any rate. given the PV clot after a brief period off medication, she merits lifelong anticoagulation.   When I make that determination, I like to have it confirmed at [facility] where they have additional expertise, so once the dust settles on the current problems I will arrange for a second opinion there.  The patient strongly feels she needs a resolution to the current problem. She does not feel she can function at home as things are now and staying in the hospital away from her family is very difficult for her. She would like to proceed to surgery.  To facilitate that I will ask pharmacy to start IV heparin and will stop the apixaban.    The apixaban T 1/2 is  approximately 12 hours, so surgery Tuesday 11/29/2014 would be possible. Heparin should be stopped 6 hours pre-op and resumed 12 hours post (no bolus) if patient stable.  If the patient is to be transferred to a tertiary care center for further evaluation, I would suggest [facility] rather than [facility] or [facility].  Will follow peripherally with you.   Lowella Dell, MD   11/26/2014 2:48 PM Medical Oncology and Hematology [facility] [address] [place], [place] [address] Tel. [phone]    Fax. [phone]

## 2014-11-26 NOTE — Progress Notes (Addendum)
General Surgery Note  LOS: 12 days  POD -  8 Days Post-Op  Assessment/Plan: 1. RLQ abdominal pain with stricture of ileum and ileitis - etiology unclear  GI - Dr. Adela Lank  Biopsies from 9/9 were nondiagnostic - they cannot tell whether this is ischemic vs inflammatory bowel disease vs some other etiology  On prednisone - 40 mg QD   This is a difficult problem.  The patient has a potential underlying coagulopathy.  She saw Dr. Bertis Ruddy on 10/12/2014, but missed follow up.  She has a stricture of TI, but non obstructing.  The cause of the stricture is uncler.  And her pain is difficult to read because of her underlying "pain syndrome".  Would she be better evaluated at a medical center?  She does not need acute surgery.  I would also be very careful offering surgery as a cure for abdominal pain without other specific finding (i.e., abscess, complete obstruction) in a patient who already has pain issues.  I had a lengthy discussion with Dr. Adela Lank - for him, resection of the involved small bowel would provide a diagnosis and, if Crohn's, lead to further treatment options.  I am very skeptical that removing the small bowel will improve her abdominal pain.  Dr. Andrey Campanile is our surgeon of the week next week and I will review all this with him.  2.  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 Oda Kilts on 10/12/2014  Hypercoagulable workup in March was negative. 3.  History of portal vein thrombosis - 10/07/2014 admission  ? Undiagnosed coagulopathy  On Eliquis 4.  Chronic pain syndrome  Interstitial cystitis diagnosed at age 61 - she sees a urologist in [place], but she cannot remember his name.  On Methadone, but she wants to get off of this. 5.  Smokes 6.  Obese - BMI 35.6 6.  DVT prophylaxis - On eliqus   Principal Problem:   Abdominal pain Active  Problems:   Tobacco abuse   Seizure   Methadone maintenance therapy patient   Portal vein thrombosis   Cerebral venous sinus thrombosis   Enteritis   Lactic acidosis   Generalized abdominal pain   Ileitis   Subjective:  Complains of right sided abdominal pain.  But advanced to a regular diet. Objective:   Filed Vitals:   11/26/14 0512  BP: 106/60  Pulse: 51  Temp: 98.5 F (36.9 C)  Resp: 16     Intake/Output from previous day:  09/16 0701 - 09/17 0700 In: 1146 [P.O.:1146] Out: -   Intake/Output this shift:      Physical Exam:   General: Obese younger WF who is alert and oriented.    HEENT: Normal. Pupils equal. .   Lungs: Clear.   Abdomen: sore right abdomen.  BS present.   Lab Results:    Recent Labs  11/24/14 0420  WBC 7.8  HGB 11.6*  HCT 36.3  PLT 249    BMET   Recent Labs  11/24/14 0420  NA 137  K 3.2*  CL 103  CO2 26  GLUCOSE 123*  BUN 10  CREATININE 0.86  CALCIUM 9.0    PT/INR   Recent Labs  11/24/14 0420  LABPROT 15.2  INR 1.18    ABG  No results for input(s): PHART, HCO3 in the last 72 hours.  Invalid input(s): PCO2, PO2   Studies/Results:  Ct Angio Abd/pel W/ And/or W/o  11/24/2014   CLINICAL DATA:  History of Crohn's disease. Evaluate for portal vein thrombosis.  EXAM: CT ANGIOGRAPHY ABDOMEN AND PELVIS WITH CONTRAST AND WITHOUT CONTRAST  TECHNIQUE: Multidetector CT imaging of the abdomen and pelvis was performed using the standard protocol during bolus administration of intravenous contrast. Multiplanar reconstructed images including MIPs were obtained and reviewed to evaluate the vascular anatomy.  CONTRAST:  OMNIPAQUE IOHEXOL 350 MG/ML SOLN  COMPARISON:  CT abdomen pelvis - 11/14/2014  FINDINGS: Vascular Findings:  Abdominal aorta: There is no atherosclerotic plaque within a normal caliber abdominal aorta. No abdominal aortic dissection No aortic wall thickening or periaortic stranding.  Celiac artery: Widely patent.   Conventional branching pattern.  SMA: Widely patent. 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 The distal tributaries of the SMA are widely patent without discrete intraluminal filling defect to suggest distal embolism.  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 The dominant cranial right-sided renal artery is widely patent without a hemodynamically significant stenosis. No vessel irregularity to suggest FMD.  Left Renal artery: Duplicated; there is an accessory left renal artery which supplies the inferior pole of the left kidney. The dominant left-sided renal artery is widely patent without a hemodynamically significant stenosis. No vessel irregularity to suggest FMD.  IMA: Widely patent without hemodynamically significant stenosis.  Pelvic vasculature: The bilateral common, external and internal iliac arteries are of normal caliber and widely patent without hemodynamically significant stenosis.  Venous Findings: The portal vein is widely patent. The IVC and bilateral pelvic venous systems are widely patent.  Review of the MIP images confirms the above findings.   --------------------------------------------------------------------------------  Nonvascular Findings:  Normal hepatic contour. There is a minimal amount of focal fatty infiltration adjacent to the fissure for ligamentum teres. No discrete hepatic lesions. Normal appearance of the gallbladder. No intra extrahepatic biliary duct dilatation. No ascites.  There is symmetric enhancement and excretion of the bilateral kidneys. No definite renal stones in this postcontrast examination. No discrete renal lesions. No urinary obstruction or perinephric stranding. Normal appearance of the bilateral adrenal glands, pancreas and spleen.  Note is again made of  short segment bowel wall thickening involving an approximately 20 cm segment of the distal ileum though sparing the terminal ileum (representative images 57 through 62, series 11). This finding is associated with mild upstream distension of the adjacent distal ileum (representative image 42, series 11) but not resulting in enteric obstruction. The amount of previously noted adjacent mesenteric stranding and reactive adenopathy within the adjacent mesenteries has decreased in the interval. No evidence of perforation or definable/drainable fluid collection. No pneumoperitoneum, pneumatosis or portal venous gas.  Large colonic stool burden. Normal appearance of the retrocecal appendix.  No bulky retroperitoneal, mesenteric, pelvic or inguinal lymphadenopathy.  Normal appearance of the pelvic organs. No discrete adnexal lesion. Normal appearance of the urinary bladder given underdistention. No free fluid in the pelvic cul-de-sac.  Limited visualization of lower thorax demonstrates minimal subsegmental atelectasis within the image caudal aspect the left lower lobe. No focal airspace opacities. No pleural effusion. Normal heart size. No pericardial effusion.  No acute or aggressive osseous abnormalities. Note is made of mild bilateral symmetric sacroiliitis potentially attributable to provided history of Crohn's disease.  Tiny mesenteric fat containing periumbilical hernia. There is  a minimal amount of soft tissue stranding about the midline of the low back.  IMPRESSION: Vascular Impression:  1. Unremarkable CTA of the abdomen and pelvis. No evidence of mesenteric ischemia or portal vein thrombosis. 2. 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 Nonvascular Impression:  1. Minimally improved bowel wall thickening involving an approximately 20 cm segment of the distal ileum with sparing of the  TI and cecum, findings compatible with provided history of Crohn's disease. There is mild upstream distention of the adjacent small bowel without evidence of enteric obstruction. No evidence of perforation or definable/drainable fluid collection. 2. Suspected mild bilateral symmetric sacroiliitis, potentially attributable to provided history of Crohn's disease.   Electronically Signed   By: Simonne Come M.D.   On: 11/24/2014 16:37     Anti-infectives:   Anti-infectives    Start     Dose/Rate Route Frequency Ordered Stop   11/18/14 1400  metroNIDAZOLE (FLAGYL) tablet 500 mg  Status:  Discontinued     500 mg Oral 3 times per day 11/18/14 0939 11/22/14 1617   11/14/14 1530  metroNIDAZOLE (FLAGYL) IVPB 500 mg  Status:  Discontinued     500 mg 100 mL/hr over 60 Minutes Intravenous Every 8 hours 11/14/14 1429 11/18/14 0939      Ovidio Kin, MD, FACS Pager: [phone] [facility] Office: [phone] 11/26/2014

## 2014-11-27 DIAGNOSIS — R1012 Left upper quadrant pain: Secondary | ICD-10-CM

## 2014-11-27 LAB — CBC WITH DIFFERENTIAL/PLATELET
Basophils Absolute: 0 10*3/uL (ref 0.0–0.1)
Basophils Relative: 0 %
Eosinophils Absolute: 0.1 10*3/uL (ref 0.0–0.7)
Eosinophils Relative: 1 %
HCT: 37.5 % (ref 36.0–46.0)
Hemoglobin: 11.6 g/dL — ABNORMAL LOW (ref 12.0–15.0)
Lymphocytes Relative: 27 %
Lymphs Abs: 2 10*3/uL (ref 0.7–4.0)
MCH: 23.2 pg — ABNORMAL LOW (ref 26.0–34.0)
MCHC: 30.9 g/dL (ref 30.0–36.0)
MCV: 74.9 fL — ABNORMAL LOW (ref 78.0–100.0)
Monocytes Absolute: 0.4 10*3/uL (ref 0.1–1.0)
Monocytes Relative: 6 %
Neutro Abs: 4.9 10*3/uL (ref 1.7–7.7)
Neutrophils Relative %: 66 %
Platelets: 250 10*3/uL (ref 150–400)
RBC: 5.01 MIL/uL (ref 3.87–5.11)
RDW: 25.7 % — ABNORMAL HIGH (ref 11.5–15.5)
WBC: 7.4 10*3/uL (ref 4.0–10.5)

## 2014-11-27 LAB — APTT
aPTT: 89 seconds — ABNORMAL HIGH (ref 24–37)
aPTT: 75 seconds — ABNORMAL HIGH (ref 24–37)

## 2014-11-27 LAB — COMPREHENSIVE METABOLIC PANEL
ALT: 19 U/L (ref 14–54)
AST: 25 U/L (ref 15–41)
Albumin: 2.9 g/dL — ABNORMAL LOW (ref 3.5–5.0)
Alkaline Phosphatase: 41 U/L (ref 38–126)
Anion gap: 11 (ref 5–15)
BUN: 11 mg/dL (ref 6–20)
CO2: 24 mmol/L (ref 22–32)
Calcium: 8.3 mg/dL — ABNORMAL LOW (ref 8.9–10.3)
Chloride: 101 mmol/L (ref 101–111)
Creatinine, Ser: 0.68 mg/dL (ref 0.44–1.00)
GFR calc Af Amer: >60 mL/min (ref >60)
GFR calc non Af Amer: >60 mL/min (ref >60)
Glucose, Bld: 94 mg/dL (ref 65–99)
Potassium: 3.9 mmol/L (ref 3.5–5.1)
Sodium: 136 mmol/L (ref 135–145)
Total Bilirubin: 0.6 mg/dL (ref 0.3–1.2)
Total Protein: 4.5 g/dL — ABNORMAL LOW (ref 6.5–8.1)

## 2014-11-27 LAB — HEPARIN LEVEL (UNFRACTIONATED)
Heparin Unfractionated: 1.22 IU/mL — ABNORMAL HIGH (ref 0.30–0.70)
Heparin Unfractionated: 0.93 IU/mL — ABNORMAL HIGH (ref 0.30–0.70)

## 2014-11-27 MED ORDER — PREDNISONE 20 MG PO TABS
20.0000 mg | ORAL_TABLET | Freq: Every day | ORAL | Status: DC
  Administered 2014-11-28 – 2014-12-01 (×4): 20 mg via ORAL
  Filled 2014-11-27 (×4): qty 1

## 2014-11-27 NOTE — Progress Notes (Signed)
Progress Note   Subjective  Patient reports she continues to feel poorly, having RUQ pain. Tolerating a diet without vomiting but has some nausea. Hematology saw her yesterday, Eliquis stopped in case she has surgery this week, now on heparin,   Objective   Vital signs in last 24 hours: Temp:  [98.1 F (36.7 C)-98.4 F (36.9 C)] 98.2 F (36.8 C) (09/18 0626) Pulse Rate:  [55-91] 55 (09/18 0626) Resp:  [16] 16 (09/18 0626) BP: (99-134)/(60-85) 99/60 mmHg (09/18 0626) SpO2:  [98 %-99 %] 99 % (09/18 0626) Last BM Date: 11/26/14 General:    white female in NAD Heart:  Regular rate and rhythm; no murmurs Lungs: Respirations even and unlabored, lungs CTA bilaterally Abdomen:  Soft, RLQ TTP without rebound or guarding. Normal bowel sounds. Extremities:  Without edema. Neurologic:  Alert and oriented,  grossly normal neurologically. Psych:  Cooperative. Normal mood and affect.  Intake/Output from previous day: 09/17 0701 - 09/18 0700 In: 1320 [P.O.:1320] Out: -  Intake/Output this shift:    Lab Results:  Recent Labs  11/27/14 0945  WBC 7.4  HGB 11.6*  HCT 37.5  PLT 250   BMET No results for input(s): NA, K, CL, CO2, GLUCOSE, BUN, CREATININE, CALCIUM in the last 72 hours. LFT No results for input(s): PROT, ALBUMIN, AST, ALT, ALKPHOS, BILITOT, BILIDIR, IBILI in the last 72 hours. PT/INR No results for input(s): LABPROT, INR in the last 72 hours.  Studies/Results: No results found.     Assessment / Plan:   24 y/o female with chronic abdominal pain, who presented with ileitis / stricture of the ileum, noted on imaging and endoscopy. Biopsies show chronic active ileitis however differential includes ischemic, vasculitis, infectious, less likely lymphoma, etc. Infectious workup negative. We have treated her empirically for Crohns however has had no benefit with steroids. Given her ESR and WBC are normal, no improvement in steroids, and her prior thrombosis history  with ?ischemia of the bowel, I have questioned the diagnosis of Crohns although it remains on the differential. CTA was done to evaluate the vasculature and the mesenteric vessels are patent, although her stricture could have represented a prior ischemic insult.   If this is Crohns, given her failure of steroids, options to induce remission include anti-TNF, surgery, and cyclosporine. I have discussed medical and surgical options. Given she isn't responding to steroids and her course seems a bit atypical for Crohns, surgery up front to resect the short segment to more definitively make a diagnosis is not unreasonable, although she is higher than average risk for surgery. A diagnosis of Crohns has significant implications for her and would warrant long term immunosuppresive therapy. After discussion of all of these issues at length, her preference at this time is surgery to make a definitive diagnosis, and is hesitant to try anti-TNF without it. Appreciate hematology input regarding her pro-thrombotic state  I appreciate general surgery input who will reevaluate her tomorrow. She is now on heparin and off Eliquis in case surgery is to occur this week. Given the complexity of her case, another opinion from a tertiary care center is also reasonable if she wishes to have further evaluation as an outpatient.    In the interim, we have weaned off IV steroids given no benefit and transition to oral with taper. If she doesn't go through with surgery following her evaluation, we will talk with her further about trial of anti-TNF.   Dr. Hilarie Fredrickson is assuming her GI care tomorrow,  I will discuss her case with him.    Edgar Cellar, MD [facility] Gastroenterology Pager [phone]   Principal Problem:   Abdominal pain Active Problems:   Tobacco abuse   Seizure   Methadone maintenance therapy patient   Portal vein thrombosis   Cerebral venous sinus thrombosis   Enteritis   Lactic acidosis   Generalized  abdominal pain   Ileitis     LOS: 13 days   Renelda Loma Armbruster  11/27/2014, 10:56 AM

## 2014-11-27 NOTE — Progress Notes (Addendum)
ANTICOAGULATION CONSULT NOTE - Follow Up Consult  Pharmacy Consult for Heparin Indication: portal vein thrombosis  Allergies  Allergen Reactions  . Ativan [Lorazepam] Other (See Comments)    " I don't remember anything."  . Bee Venom Swelling  . Ciprofloxacin Diarrhea and Nausea And Vomiting    Patient Measurements: Height: 5' 3"  (160 cm) Weight: 201 lb (91.173 kg) IBW/kg (Calculated) : 52.4 Heparin Dosing Weight: 73 kg  Vital Signs: Temp: 98.6 F (37 C) (09/18 1345) Temp Source: Oral (09/18 1345) BP: 129/69 mmHg (09/18 1345) Pulse Rate: 81 (09/18 1345)  Labs:  Recent Labs  11/27/14 0945 11/27/14 1223  HGB 11.6*  --   HCT 37.5  --   PLT 250  --   APTT  --  89*  HEPARINUNFRC  --  1.22*  CREATININE 0.68  --     Estimated Creatinine Clearance: 115.2 mL/min (by C-G formula based on Cr of 0.68).   Medications:  Infusions:  . heparin 1,100 Units/hr (11/26/14 2334)    Assessment: 24 year old female on chronic anticoagulation with apixaban for portal vein thrombosis.  Her apixaban has been stopped in preparation for possible surgery. She was transitioned to heparin. Apixaban can affect the heparin level assay and PTTs are used for heparin monitoring until apixaban is no longer affecting the heparin level assay.  The PTT is within the therapeutic range.  The heparin level remains supratherapeutic indicating influence from apixaban.  Goal of Therapy:  Heparin level 0.3-0.7 units/ml aPTT 66-102 seconds Monitor platelets by anticoagulation protocol: Yes   Plan:  Continue Heparin at 1100 units/hr Heparin level and aPTT in 6 hours to confirm Daily heparin level, aPTT, and CBC for now  Legrand Como, Pharm.D., BCPS, AAHIVP Clinical Pharmacist Phone: [phone] or [phone] 11/27/2014, 3:09 PM   Addendum -aptt therapeutic, HL high, will continue to dose based on aptt given patient probably still has Apixaban in her system -continue heparin at 1100 units/hr -Daily  HL, CBC, aptt -Monitor s/sx bleeding   Harvel Quale  11/27/2014 8:18 PM

## 2014-11-27 NOTE — Progress Notes (Signed)
No acute changes ongoing discussions between GI/Gen surg We will again re-visit patient in am after she has had a chance to discuss with Gen surgery Dr. Trixie Dredge, MD [facility] (P) [phone]

## 2014-11-27 NOTE — Progress Notes (Signed)
Patient ID: Victoria Alvarez, female   DOB: 10/26/1990, 24 y.o.   MRN: [id number] 9 Days Post-Op  Subjective: Pt is not having a good day secondary to persistent abdominal pain.  Says she has some nausea  Objective: Vital signs in last 24 hours: Temp:  [98.1 F (36.7 C)-98.4 F (36.9 C)] 98.2 F (36.8 C) (09/18 0626) Pulse Rate:  [55-91] 55 (09/18 0626) Resp:  [16] 16 (09/18 0626) BP: (99-134)/(60-85) 99/60 mmHg (09/18 0626) SpO2:  [98 %-99 %] 99 % (09/18 0626) Last BM Date: 11/26/14  Intake/Output from previous day: 09/17 0701 - 09/18 0700 In: 1320 [P.O.:1320] Out: -  Intake/Output this shift:    PE: Abd: soft, but tender greatest on right side, +BS, obese  Lab Results:   Recent Labs  11/27/14 0945  WBC 7.4  HGB 11.6*  HCT 37.5  PLT 250   BMET No results for input(s): NA, K, CL, CO2, GLUCOSE, BUN, CREATININE, CALCIUM in the last 72 hours. PT/INR No results for input(s): LABPROT, INR in the last 72 hours. CMP     Component Value Date/Time   NA 137 11/24/2014 0420   K 3.2* 11/24/2014 0420   CL 103 11/24/2014 0420   CO2 26 11/24/2014 0420   GLUCOSE 123* 11/24/2014 0420   BUN 10 11/24/2014 0420   CREATININE 0.86 11/24/2014 0420   CREATININE 0.81 07/12/2014 1717   CALCIUM 9.0 11/24/2014 0420   PROT 5.0* 11/24/2014 0420   ALBUMIN 3.4* 11/24/2014 0420   AST 25 11/24/2014 0420   ALT 24 11/24/2014 0420   ALKPHOS 47 11/24/2014 0420   BILITOT 0.5 11/24/2014 0420   GFRNONAA >60 11/24/2014 0420   GFRNONAA >89 07/12/2014 1717   GFRAA >60 11/24/2014 0420   GFRAA >89 07/12/2014 1717   Lipase     Component Value Date/Time   LIPASE 30 11/14/2014 0726       Studies/Results: No results found.  Anti-infectives: Anti-infectives    Start     Dose/Rate Route Frequency Ordered Stop   11/18/14 1400  metroNIDAZOLE (FLAGYL) tablet 500 mg  Status:  Discontinued     500 mg Oral 3 times per day 11/18/14 0939 11/22/14 1617   11/14/14 1530  metroNIDAZOLE (FLAGYL) IVPB 500  mg  Status:  Discontinued     500 mg 100 mL/hr over 60 Minutes Intravenous Every 8 hours 11/14/14 1429 11/18/14 0939       Assessment/Plan 1. 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 Havery Moros Biopsies from 9/9 were nondiagnostic - they cannot tell whether this is ischemic vs inflammatory bowel disease vs some other etiology On prednisone - 40 mg QD This is a difficult problem. The patient has a potential underlying coagulopathy. She saw Dr. Alvy Bimler on 10/12/2014, but missed follow up. She has a stricture of TI, but non obstructing. The cause of the stricture is uncler. And her pain is difficult to read because of her underlying "pain syndrome". Would she be better evaluated at a medical center? She does not need acute surgery. I would also be very careful offering surgery as a cure for abdominal pain without other specific finding (i.e., abscess, complete obstruction) in a patient who already has pain issues. Dr. Redmond Pulling will have a conversation with her tomorrow regarding plan from here, whether that's an operation or not.  Transfer to [facility] is recommended, but I believe patient has not wanted to pursue that.  2. 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 Jana Hakim yesterday and he recommended lifelong anti-coagulation 3. History of portal vein thrombosis - 10/07/2014 admission  coagulopathy, unknown etiology currently eliquis held and patient started on IV heparin incase surgery is decided upon 4. Chronic pain syndrome Interstitial cystitis diagnosed at age 48 - she sees a urologist in [place], but she cannot remember his name. On Methadone, but she wants to get off of this. 5.  Smokes 6. Obese - BMI 35.6 6. DVT prophylaxis - IV heparin drip  LOS: 13 days    OSBORNE,KELLY E 11/27/2014, 10:46 AM Pager: [phone]  Agree with above.  Alphonsa Overall, MD, [facility] Surgery Pager: [phone] Office phone:  [phone]

## 2014-11-28 DIAGNOSIS — R1031 Right lower quadrant pain: Secondary | ICD-10-CM

## 2014-11-28 LAB — GI PATHOGEN PANEL BY PCR, STOOL

## 2014-11-28 LAB — CBC
HCT: 35.4 % — ABNORMAL LOW (ref 36.0–46.0)
Hemoglobin: 11.5 g/dL — ABNORMAL LOW (ref 12.0–15.0)
MCH: 24.2 pg — ABNORMAL LOW (ref 26.0–34.0)
MCHC: 32.5 g/dL (ref 30.0–36.0)
MCV: 74.5 fL — ABNORMAL LOW (ref 78.0–100.0)
Platelets: 240 10*3/uL (ref 150–400)
RBC: 4.75 MIL/uL (ref 3.87–5.11)
RDW: 25.9 % — ABNORMAL HIGH (ref 11.5–15.5)
WBC: 9.6 10*3/uL (ref 4.0–10.5)

## 2014-11-28 LAB — APTT: aPTT: 105 seconds — ABNORMAL HIGH (ref 24–37)

## 2014-11-28 LAB — HEPARIN LEVEL (UNFRACTIONATED): Heparin Unfractionated: 0.98 IU/mL — ABNORMAL HIGH (ref 0.30–0.70)

## 2014-11-28 LAB — ANTINUCLEAR ANTIBODIES, IFA: ANA Ab, IFA: NEGATIVE

## 2014-11-28 MED ORDER — APIXABAN 5 MG PO TABS
5.0000 mg | ORAL_TABLET | Freq: Two times a day (BID) | ORAL | Status: DC
  Administered 2014-11-28 – 2014-12-05 (×14): 5 mg via ORAL
  Filled 2014-11-28 (×15): qty 1

## 2014-11-28 NOTE — Progress Notes (Signed)
[facility]                                                           24 y.o. 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  Victoria Alvarez has been hospitalized 2x recently for similar symptoms.  She was feeling better after discharge 8/27 but still having cramping pain that was relieved with bowel movements.   9/4 she began having continuous abdominal pain not relieved with bowel movements. The pain is located thru out her abdomen. It stops her from eating. In the ER her lactic acid is 2.88, wbc is 11.6, CT abdomen/pelvis shows: circumferential wall thickening in the distal ileum. She was seen by GI and underwent colonoscopy with distal ileal stricture-possible crohbn's being entertained ANA was done 9/15 and was neg Gen surgery evlautating for possible surgeyr   Assessment & Plan     Abdominal Pain secondary to Enteritis-? Crohn's vs other such as ischemic colitis? - CT abdomen showed enteritis involving the distal ileum with adjacent inflammation DDx inflammatory, infectious or Crohn's disease.  ESR/CRP wnl. - on Flagyl (allergy to Cipro).Patient started on prednisone by GI in case the ileal stricture represents IBD, no significant improvement in symptoms yet - Colonoscopy 9/9 showed tight but not complete distal ileum stricture that appears ulcerated, inflamed with distal and about 10-15 cm proximal to the IC valve, biopsies appear to be showing active inflammation -Gen surgery consulted 9/14 and non-operative management advised as recent diagnosis Portal vein thrombosis 1 mo ago so risky to take off of AC for ~ 6 wk -CT angio abd essentially non revealing 9/17 -patient finalizeng her deicsions re; transfer [facility] vs local OP surgical follow up in [place] for elective  procedure once all the peri-op risks [was on steroids, recent Portal vn Thrombosis] are resolved - steroids cut back by GI 9/17 -flagyll d/c 9/13  Lactic Acidosis -Resolved with 2L IVF.  Left Portal VeinThrombosis. - Diagnosed on 10/11/14, cont Eliquis. -patient claims compliance with meds -She is not a great candidate for surgery currently with poor PO intake and likely poor protein reserve  -non bloody stools  Cerebral Sinus Venous Thrombosis Resumed on Eliquis as no impneding surgery in sight\  no residual neurologic deficit per patient.  Seizure - Stable, continue Keppra 500 bid  Chronic Pain / Hx of interstitial cystitis Continue 85 mg methadone daily. Continue Vicodin as well -seen in [place] in the past by a Dr. Aldona Bar for this -apparently stable  Tobacco Abuse Nicotine Patch  History of depression Appears stable, denies any suicidal/homicidal ideation. Continue Prozac 20 daily.  TSH 4.3, free T4 0.9   Questionable hypercoagulable state;  Hx of miscarriage, stroke, multiple thrombosis.  -Sucpsicion raised as patient has relative-Mother-with Lupus -Also has cousin with Crohn's -Given non-response to Steroids, likely would be beneficial to determine other etiologies such as Lupus enteritis which although Rare might be a unifying diagnosis-I have ordered an ANA which is pending -has been worked up for paroxysmal NH, myeloprolif disorder on admission 10/2014 by Dr. Alvy Bimler -Jak-2 was neg -Protein S was diminished as well that admission -  Cardiolipin IGm was ?  - Unfortunately  missed her appt, hematology to see on d/c oncology consulted by GI 9/17 for opinion and appreciate input-needs Life-long AC regardless of casue  Mild leukopenia: unclear significance, resolved.  Code Status: full code   Family Communication: Discussed in detail with the patient, all imaging results, lab results explained to the patient    Disposition Plan: not medically ready  Time Spent in  minutes  25 minutes  Procedures  colonoscopy  Consults   GI   DVT Prophylaxis eliquis  Medications  Scheduled Meds: . FLUoxetine  20 mg Oral Daily  . levETIRAcetam  500 mg Oral BID  . methadone  85 mg Oral Daily  . nicotine  21 mg Transdermal Daily  . pantoprazole  40 mg Oral Q0600  . predniSONE  20 mg Oral Q breakfast   Continuous Infusions:  PRN Meds:.acetaminophen **OR** [DISCONTINUED] acetaminophen, alum & mag hydroxide-simeth, Influenza vac split quadrivalent PF, ondansetron **OR** ondansetron (ZOFRAN) IV, oxyCODONE-acetaminophen, sodium phosphate   Antibiotics   Anti-infectives    Start     Dose/Rate Route Frequency Ordered Stop   11/18/14 1400  metroNIDAZOLE (FLAGYL) tablet 500 mg  Status:  Discontinued     500 mg Oral 3 times per day 11/18/14 0939 11/22/14 1617   11/14/14 1530  metroNIDAZOLE (FLAGYL) IVPB 500 mg  Status:  Discontinued     500 mg 100 mL/hr over 60 Minutes Intravenous Every 8 hours 11/14/14 1429 11/18/14 0939        Subjective:   No issues Confused about which way to go Doing fair tolerating diet No chest pain no nausea no vomiting Passing stool  Objective:   Blood pressure 120/66, pulse 83, temperature 98.6 F (37 C), temperature source Oral, resp. rate 18, height 5' 3"  (1.6 m), weight 91.173 kg (201 lb), SpO2 99 %, not currently breastfeeding.  Wt Readings from Last 3 Encounters:  11/14/14 91.173 kg (201 lb)  11/06/14 91 kg (200 lb 9.9 oz)  10/07/14 77.111 kg (170 lb)     Intake/Output Summary (Last 24 hours) at 11/28/14 1431 Last data filed at 11/27/14 1826  Gross per 24 hour  Intake    360 ml  Output      0 ml  Net    360 ml    Exam  General: Alert and oriented x 3, NAD  HEENT:  PERRLA, EOMI, Mild abdominal tenderness   Data Review   Micro Results No results found for this or any previous visit (from the past 240 hour(s)).  Radiology Reports Dg Abd 1 View  11/15/2014   CLINICAL DATA:  Lower abdominal pain   EXAM: ABDOMEN - 1 VIEW  COMPARISON:  Abdominal radiographs October 14, 2014; CT abdomen and pelvis November 14, 2014  FINDINGS: There is moderate stool in the colon. There is no bowel dilatation or air-fluid level suggesting obstruction. No free air is seen on this supine examination. There are surgical clips in the left pelvic region.  IMPRESSION: Bowel gas pattern overall unremarkable.   Electronically Signed   By: Lowella Grip III M.D.   On: 11/15/2014 12:45   Ct Abdomen Pelvis W Contrast  11/14/2014   CLINICAL DATA:  24 year old female with acute abdominal and pelvic pain with vomiting for 1 day.  EXAM: CT ABDOMEN AND PELVIS WITH CONTRAST  TECHNIQUE: Multidetector CT imaging of the abdomen and pelvis was performed using the standard protocol following bolus administration of intravenous contrast.  CONTRAST:  169m OMNIPAQUE IOHEXOL 300 MG/ML  SOLN  COMPARISON:  11/03/2014 and prior CTs  FINDINGS: Lower chest:  Minimal basilar atelectasis noted.  Hepatobiliary: No significant hepatic or gallbladder abnormalities. There is no evidence of biliary dilatation.  Pancreas: Unremarkable  Spleen: Unremarkable  Adrenals/Urinary Tract: Kidneys, adrenal glands and bladder are unremarkable.  Stomach/Bowel: There is circumferential wall thickening of a moderate length segment distal ileum with adjacent inflammation and small amount of interloop fluid. There is mild distention of small bowel loops just proximal to the moderate segment wall thickening. There is no evidence of abscess or pneumoperitoneum. The appendix is normal.  Vascular/Lymphatic: Prominent mesenteric lymph nodes are likely reactive. There is no evidence of vascular abnormality.  Reproductive: Uterus and adnexal regions are unremarkable.  Other: A trace amount of free pelvic fluid is noted.  Musculoskeletal: No acute or suspicious abnormalities identified.  IMPRESSION: Enteritis involving the distal ileum with adjacent inflammation and small amount of  fluid. This likely is inflammatory, infectious or Crohn's disease. No evidence of pneumoperitoneum or abscess.   Electronically Signed   By: Margarette Canada M.D.   On: 11/14/2014 10:28   Ct Abdomen Pelvis W Contrast  11/02/2014   CLINICAL DATA:  Abdominal pain since leaving the hospital a few weeks ago. 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  EXAM: CT ABDOMEN AND PELVIS WITH CONTRAST  TECHNIQUE: Multidetector CT imaging of the abdomen and pelvis was performed using the standard protocol following bolus administration of intravenous contrast.  CONTRAST:  160m OMNIPAQUE IOHEXOL 300 MG/ML  SOLN  COMPARISON:  10/11/2014  FINDINGS: 3 mm nodule in the left lung base without change since prior study.  Small focal fatty infiltration in the liver adjacent to the falciform ligament. Tiny sub cm low-attenuation lesion in the medial segment left lobe unchanged since prior study. This too small to characterize but probably represents a cyst or hemangioma. No other focal liver lesions. Gallbladder, spleen, pancreas, adrenal glands, kidneys, abdominal aorta, inferior vena cava, and retroperitoneal lymph nodes are unremarkable. Stomach, small bowel, and colon are not abnormally distended. Residual thickening of the wall of the distal ileum. This could represent residua of previous vascular compromise or inflammatory change. Visualized portions of the superior mesenteric artery and vein appear patent. No significant residual portal venous thrombosis. Small amount of free fluid in the lower abdominal mesenteric in the area of thickened bowel. No pneumatosis or portal venous gas. No free air. Mild prominence of mesenteric lymph nodes, probably reactive.  Pelvis: Small amount of free fluid in the pelvis could be reactive or physiologic. Uterus and ovaries are not enlarged. Mild bladder wall thickening may be due to under distention or cystitis. Appendix is normal. No pelvic mass or lymphadenopathy. No destructive bone  lesions.  IMPRESSION: Residual bowel wall thickening in the low abdomen with small amount of adjacent free fluid. This could represent residual vascular compromise or inflammatory process. The previous portal venous thrombosis has resolved.   Electronically Signed   By: WLucienne CapersM.D.   On: 11/02/2014 23:59   Dg Abd Portable 1v  11/22/2014   CLINICAL DATA:  Right lower quadrant pain with associated mild generalized abdominal pain.  EXAM: PORTABLE ABDOMEN - 1 VIEW  COMPARISON:  11/17/2014  FINDINGS: The bowel gas pattern is nonobstructive. There is a long tubular radiolucent structure which seen to arise from the cecum, with associated surrounding soft tissue thickening.  No radio-opaque calculi or other significant radiographic abnormality are seen. Moderate amount of stool throughout the colon. No radiographic evidence of organomegaly. Osseous  structures are grossly normal.  IMPRESSION: Nonobstructive bowel gas pattern with evidence of constipation.  Long tubular structure which seems to arise from wall are overlie the distal cecum. This may represent a gas containing thickened distal ileum, or abnormal appendix. In correlation to CT of the abdomen dated 11/14/2014, there is no evidence of appendicitis, and the distal ileum appears inflamed. Therefore, this radiographic finding likely correlates to the segment of abnormal ileum. Radiographic or CT follow-up is recommended.  These results will be called to the ordering clinician or representative by the Radiologist Assistant, and communication documented in the PACS or zVision Dashboard.   Electronically Signed   By: Fidela Salisbury M.D.   On: 11/22/2014 12:15   Ct Angio Abd/pel W/ And/or W/o  11/24/2014   CLINICAL DATA:  History of Crohn's disease. Evaluate for portal vein thrombosis.  EXAM: CT ANGIOGRAPHY ABDOMEN AND PELVIS WITH CONTRAST AND WITHOUT CONTRAST  TECHNIQUE: Multidetector CT imaging of the abdomen and pelvis was performed using the  standard protocol during bolus administration of intravenous contrast. Multiplanar reconstructed images including MIPs were obtained and reviewed to evaluate the vascular anatomy.  CONTRAST:  162m OMNIPAQUE IOHEXOL 350 MG/ML SOLN  COMPARISON:  CT abdomen pelvis - 11/14/2014  FINDINGS: Vascular Findings:  Abdominal aorta: There is no atherosclerotic plaque within a normal caliber abdominal aorta. No abdominal aortic dissection No aortic wall thickening or periaortic stranding.  Celiac artery: Widely patent.  Conventional branching pattern.  SMA: Widely patent. 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 The distal tributaries of the SMA are widely patent without discrete intraluminal filling defect to suggest distal embolism.  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 The dominant cranial right-sided renal artery is widely patent without a hemodynamically significant stenosis. No vessel irregularity to suggest FMD.  Left Renal artery: Duplicated; there is an accessory left renal artery which supplies the inferior pole of the left kidney. The dominant left-sided renal artery is widely patent without a hemodynamically significant stenosis. No vessel irregularity to suggest FMD.  IMA: Widely patent without hemodynamically significant stenosis.  Pelvic vasculature: The bilateral common, external and internal iliac arteries are of normal caliber and widely patent without hemodynamically significant stenosis.  Venous Findings: The portal vein is widely patent. The IVC and bilateral pelvic venous systems are widely patent.  Review of the MIP images confirms the above findings.   --------------------------------------------------------------------------------  Nonvascular Findings:  Normal hepatic contour. There is a minimal  amount of focal fatty infiltration adjacent to the fissure for ligamentum teres. No discrete hepatic lesions. Normal appearance of the gallbladder. No intra extrahepatic biliary duct dilatation. No ascites.  There is symmetric enhancement and excretion of the bilateral kidneys. No definite renal stones in this postcontrast examination. No discrete renal lesions. No urinary obstruction or perinephric stranding. Normal appearance of the bilateral adrenal glands, pancreas and spleen.  Note is again made of short segment bowel wall thickening involving an approximately 20 cm segment of the distal ileum though sparing the terminal ileum (representative images 57 through 62, series 11). This finding is associated with mild upstream distension of the adjacent distal ileum (representative image 42, series 11) but not resulting in enteric obstruction. The amount of previously noted adjacent mesenteric stranding and reactive adenopathy within the adjacent mesenteries has decreased in the interval. No evidence  of perforation or definable/drainable fluid collection. No pneumoperitoneum, pneumatosis or portal venous gas.  Large colonic stool burden. Normal appearance of the retrocecal appendix.  No bulky retroperitoneal, mesenteric, pelvic or inguinal lymphadenopathy.  Normal appearance of the pelvic organs. No discrete adnexal lesion. Normal appearance of the urinary bladder given underdistention. No free fluid in the pelvic cul-de-sac.  Limited visualization of lower thorax demonstrates minimal subsegmental atelectasis within the image caudal aspect the left lower lobe. No focal airspace opacities. No pleural effusion. Normal heart size. No pericardial effusion.  No acute or aggressive osseous abnormalities. Note is made of mild bilateral symmetric sacroiliitis potentially attributable to provided history of Crohn's disease.  Tiny mesenteric fat containing periumbilical hernia. There is a minimal amount of soft tissue  stranding about the midline of the low back.  IMPRESSION: Vascular Impression:  1. Unremarkable CTA of the abdomen and pelvis. No evidence of mesenteric ischemia or portal vein thrombosis. 2. 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 Nonvascular Impression:  1. Minimally improved bowel wall thickening involving an approximately 20 cm segment of the distal ileum with sparing of the TI and cecum, findings compatible with provided history of Crohn's disease. There is mild upstream distention of the adjacent small bowel without evidence of enteric obstruction. No evidence of perforation or definable/drainable fluid collection. 2. Suspected mild bilateral symmetric sacroiliitis, potentially attributable to provided history of Crohn's disease.   Electronically Signed   By: Sandi Mariscal M.D.   On: 11/24/2014 16:37   Ct Angio Abd/pel W/ And/or W/o  11/03/2014   CLINICAL DATA:  Severe left lower quadrant pain, and nausea. Patient with portal venous and cerebral venous thrombosis.  EXAM: CT ANGIOGRAPHY ABDOMEN AND PELVIS WITH CONTRAST AND WITHOUT CONTRAST  TECHNIQUE: Multidetector CT imaging of the abdomen and pelvis was performed using the standard protocol during bolus administration of intravenous contrast. Multiplanar reconstructed images including MIPs were obtained and reviewed to evaluate the vascular anatomy.  CONTRAST:  100 cc Omnipaque 350.  COMPARISON:  CT of the abdomen pelvis dated 11/02/2014  FINDINGS: Arterial findings  Aorta  Normal in caliber, without filling defects or stenosis.  Celiac axis  Normal in caliber, without filling defects or stenosis.  Superior mesenteric  Normal in caliber, without filling defects or stenosis.  Left renal  Normal in caliber, without filling defects or stenosis. Accessory left renal artery is noted.  Right renal  Normal in caliber, without  filling defects or stenosis.  Inferior mesenteric  Normal in caliber, without filling defects or stenosis.  Left iliac  Normal in caliber, without filling defects or stenosis.  Right iliac  Normal in caliber, without filling defects or stenosis.  Venous findings  No evidence of portal venous thrombosis.  Review of the MIP images confirms the above findings.  Nonvascular findings  There is mesenteric stranding and small amount of free fluid within the mid left abdomen. There is also a long segment of bowel thickening of the distal third of the ileum. The distal most ileum is noted to be distended and fluid-filled with maximum diameter of 2.7 cm. The terminal ileum is prominent. The appendix is normal. The remainder of the upper GI tract and colon are normal. Multiple sub cm rounded mesenteric lymph nodes are seen within the same area of the abdomen. There is a tiny amount of free fluid, tracking along the left pericolic gutter. There is a small amount of free fluid within  the pelvis as well.  Focal fatty infiltration along the falciform ligament of the liver is noted, otherwise the liver appears normal. The spleen, pancreas, bilateral adrenal glands, bilateral kidneys are normal. Vicarious excretion of contrast within the otherwise normal gallbladder is noted.  IMPRESSION: No evidence of a arterial or venous thrombosis.  Increased mesenteric stranding with interspersed shotty mesenteric lymph nodes within the mid left abdomen, in the area of long segment of thickening of the distal ileum. Thickening of the terminal ileum and a focal distention of up to 2.7 cm of the distal ileum leading to it. These findings may represent infectious, inflammatory or ischemic bowel changes, with associated reactive lymphadenopathy. Alternatively mesenteric adenitis is of consideration.   Electronically Signed   By: Fidela Salisbury M.D.   On: 11/03/2014 11:42    CBC  Recent Labs Lab 11/23/14 0340 11/24/14 0420  11/27/14 0945 11/28/14 0332  WBC 5.9 7.8 7.4 9.6  HGB 12.4 11.6* 11.6* 11.5*  HCT 39.0 36.3 37.5 35.4*  PLT 249 249 250 240  MCV 73.3* 73.9* 74.9* 74.5*  MCH 23.3* 23.6* 23.2* 24.2*  MCHC 31.8 32.0 30.9 32.5  RDW 25.8* 25.9* 25.7* 25.9*  LYMPHSABS  --   --  2.0  --   MONOABS  --   --  0.4  --   EOSABS  --   --  0.1  --   BASOSABS  --   --  0.0  --     Chemistries   Recent Labs Lab 11/24/14 0420 11/27/14 0945  NA 137 136  K 3.2* 3.9  CL 103 101  CO2 26 24  GLUCOSE 123* 94  BUN 10 11  CREATININE 0.86 0.68  CALCIUM 9.0 8.3*  AST 25 25  ALT 24 19  ALKPHOS 47 41  BILITOT 0.5 0.6   ------------------------------------------------------------------------------------------------------------------ estimated creatinine clearance is 115.2 mL/min (by C-G formula based on Cr of 0.68). ------------------------------------------------------------------------------------------------------------------ No results for input(s): HGBA1C in the last 72 hours. ------------------------------------------------------------------------------------------------------------------ No results for input(s): CHOL, HDL, LDLCALC, TRIG, CHOLHDL, LDLDIRECT in the last 72 hours. ------------------------------------------------------------------------------------------------------------------ No results for input(s): TSH, T4TOTAL, T3FREE, THYROIDAB in the last 72 hours.  Invalid input(s): FREET3 ------------------------------------------------------------------------------------------------------------------ No results for input(s): VITAMINB12, FOLATE, FERRITIN, TIBC, IRON, RETICCTPCT in the last 72 hours.  Coagulation profile  Recent Labs Lab 11/24/14 0420  INR 1.18    No results for input(s): DDIMER in the last 72 hours.  Cardiac Enzymes No results for input(s): CKMB, TROPONINI, MYOGLOBIN in the last 168 hours.  Invalid input(s):  CK ------------------------------------------------------------------------------------------------------------------ Invalid input(s): POCBNP  No results for input(s): GLUCAP in the last 72 hours.  Verneita Griffes, MD [facility] [phone]

## 2014-11-28 NOTE — Progress Notes (Addendum)
ANTICOAGULATION CONSULT NOTE - Follow Up Consult  Pharmacy Consult for Heparin Indication: portal vein thrombosis  Allergies  Allergen Reactions  . Ativan [Lorazepam] Other (See Comments)    " I don't remember anything."  . Bee Venom Swelling  . Ciprofloxacin Diarrhea and Nausea And Vomiting    Patient Measurements: Height:  (160 cm) Weight: 201 lb (91.173 kg) IBW/kg (Calculated) : 52.4 Heparin Dosing Weight: 73 kg  Vital Signs: Temp: 98.6 F (37 C) (09/19 0643) Temp Source: Oral (09/19 0643) BP: 120/66 mmHg (09/19 0643) Pulse Rate: 83 (09/19 0643)  Labs:  Recent Labs  11/27/14 0945 11/27/14 1223 11/27/14 1846 11/28/14 0332 11/28/14 0800  HGB 11.6*  --   --  11.5*  --   HCT 37.5  --   --  35.4*  --   PLT 250  --   --  240  --   APTT  --  89* 75*  --  105*  HEPARINUNFRC  --  1.22* 0.93*  --  0.98*  CREATININE 0.68  --   --   --   --     Estimated Creatinine Clearance: 115.2 mL/min (by C-G formula based on Cr of 0.68).   Medications:  Infusions:  . heparin 1,100 Units/hr (11/27/14 1955)    Assessment: 24 year old female on chronic anticoagulation with apixaban for portal vein thrombosis.  Her apixaban has been stopped in preparation for possible surgery. She was transitioned to heparin. Apixaban can affect the heparin level assay and PTTs are used for heparin monitoring until apixaban is no longer affecting the heparin level assay.  The PTT is slightly above the therapeutic range.  The heparin level remains supratherapeutic indicating influence from apixaban.  CBC stable, no bleeding reported.  Goal of Therapy:  Heparin level 0.3-0.7 units/ml aPTT 66-102 seconds Monitor platelets by anticoagulation protocol: Yes   Plan:  decrease Heparin to 1000 units/hr Heparin level and aPTT in 6 hours  Daily heparin level, aPTT, and CBC for now  Herby Abraham, Pharm.D. [phone] 11/28/2014 2:08 PM  Addendum: to transition back to apixaban 5 mg po  BID  Herby Abraham, Pharm.D. [phone] 11/28/2014 2:58 PM

## 2014-11-28 NOTE — Progress Notes (Signed)
[facility] Progress Note  Subjective:  Continues to have RLQ pain. Tol reg diet but only very small amounts, and feels very nauseous.  Has not vomited. Last BM 3 days ago.Eliquis stoppe in case she has surgerythis week, on heparin.   Objective:  Vital signs in last 24 hours: Temp:  [98.6 F (37 C)] 98.6 F (37 C) (09/19 0643) Pulse Rate:  [81-83] 83 (09/19 0643) Resp:  [16-18] 18 (09/19 0643) BP: (120-129)/(66-69) 120/66 mmHg (09/19 0643) SpO2:  [99 %] 99 % (09/19 0643) Last BM Date: 11/26/14 General:   Alert,  Well-developed,    in NAD Heart:  Regular rate and rhythm; no murmurs Pulm;lungs clear Abdomen:  Soft, TTP RLQ, no rebound or guarding, Normal bowel sounds.   Extremities:  Without edema. Neurologic:Alert and  oriented x4;  grossly normal neurologically. Psych:  Alert and cooperative. Normal mood and affect.  Intake/Output from previous day: 09/18 0701 - 09/19 0700 In: 927 [P.O.:927] Out: -  Intake/Output this shift:    Lab Results:  Recent Labs  11/27/14 0945 11/28/14 0332  WBC 7.4 9.6  HGB 11.6* 11.5*  HCT 37.5 35.4*  PLT 250 240   BMET  Recent Labs  11/27/14 0945  NA 136  K 3.9  CL 101  CO2 24  GLUCOSE 94  BUN 11  CREATININE 0.68  CALCIUM 8.3*   LFT  Recent Labs  11/27/14 0945  PROT 4.5*  ALBUMIN 2.9*  AST 25  ALT 19  ALKPHOS 41  BILITOT 0.6     ASSESSMENT/PLAN:  24 y/o female with chronic abdominal pain, who presented with ileitis / stricture of the ileum, noted on imaging and endoscopy. Biopsies show chronic active ileitis however differential includes ischemic, vasculitis, infectious, less likely lymphoma, etc. Infectious workup negative. We have treated her empirically for Crohns however has had no benefit with steroids. Given her ESR and WBC are normal, no improvement in steroids, and her prior thrombosis history with ?ischemia of the bowel, I? the diagnosis of Crohns although it remains on the differential.  CTA was done to evaluate the vasculature and the mesenteric vessels are patent, although her stricture could have represented a prior ischemic insult.  If this is Crohns, given her failure of steroids, options to induce remission include anti-TNF, surgery, and cyclosporine. Dr Havery Moros has discussed medical and surgical options. Given she isn't responding to steroids and her course seems a bit atypical for Crohns, surgery up front to resect the short segment to more definitively make a diagnosis is not unreasonable, although she is higher than average risk for surgery. A diagnosis of Crohns has significant implications for her and would warrant long term immunosuppresive therapy. After discussion of all of these issues at length, her preference at this time is surgery to make a definitive diagnosis, and is hesitant to try anti-TNF without it. Appreciate hematology input regarding her pro-thrombotic state .appreciate general surgery input who will reevaluate. She is now on heparin and off Eliquis in case .     LOS: 14 days   Hvozdovic, Vita Barley PA-C 11/28/2014, Pager [phone]

## 2014-11-28 NOTE — Progress Notes (Signed)
10 Days Post-Op  Subjective: Still having RLQ/central abd pain. +flatus. No BM. Nausea. difft pain than interstitial cystitis. Sitting on edge of bed eating fries and grilled chicken sandwich  Objective: Vital signs in last 24 hours: Temp:  [98.6 F (37 C)] 98.6 F (37 C) (09/19 0643) Pulse Rate:  [81-83] 83 (09/19 0643) Resp:  [16-18] 18 (09/19 0643) BP: (120-129)/(66-69) 120/66 mmHg (09/19 0643) SpO2:  [99 %] 99 % (09/19 0643) Last BM Date: 11/26/14  Intake/Output from previous day: 09/18 0701 - 09/19 0700 In: 927 [P.O.:927] Out: -  Intake/Output this shift:    Alert, nontoxic, not ill appearing Obese, soft, no guarding/rebound. Very mild TTP  Lab Results:   Recent Labs  11/27/14 0945 11/28/14 0332  WBC 7.4 9.6  HGB 11.6* 11.5*  HCT 37.5 35.4*  PLT 250 240   BMET  Recent Labs  11/27/14 0945  NA 136  K 3.9  CL 101  CO2 24  GLUCOSE 94  BUN 11  CREATININE 0.68  CALCIUM 8.3*   PT/INR No results for input(s): LABPROT, INR in the last 72 hours. ABG No results for input(s): PHART, HCO3 in the last 72 hours.  Invalid input(s): PCO2, PO2  Studies/Results: No results found.  Anti-infectives: Anti-infectives    Start     Dose/Rate Route Frequency Ordered Stop   11/18/14 1400  metroNIDAZOLE (FLAGYL) tablet 500 mg  Status:  Discontinued     500 mg Oral 3 times per day 11/18/14 0939 11/22/14 1617   11/14/14 1530  metroNIDAZOLE (FLAGYL) IVPB 500 mg  Status:  Discontinued     500 mg 100 mL/hr over 60 Minutes Intravenous Every 8 hours 11/14/14 1429 11/18/14 0939      Assessment/Plan: s/p Procedure(s): COLONOSCOPY WITH PROPOFOL (N/A) 1. 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 Adela Lank Biopsies from 9/9 were nondiagnostic - they cannot tell whether this is ischemic vs inflammatory bowel disease vs some other etiology On prednisone - 20 mg QD, on taper This is a  difficult problem. The patient has a potential underlying coagulopathy. She saw Dr. Bertis Ruddy on 10/12/2014, but missed follow up. She has a stricture of TI, but non obstructing. The cause of the stricture is uncler. And her pain is difficult to read because of her underlying "pain syndrome".  She does not need acute surgery. I would also be very careful offering surgery as a cure for abdominal pain without other specific finding (i.e., abscess, complete obstruction) in a patient who already has pain issues.  i have reviewed her CTs, hospital dc summaries and colonoscopy. i think this section of bowel will need to be resected but the million dollar question is timing of surgery. She is not clinically obstructed. She has no fever, wbc. I think she needs to be optimized for surgery - 2 weeks off of prednisone, 6 weeks since resolution of L PV thrombosis in my opinion. It was no longer present on a Ct in late august. I personally believe she would be best served and managed at a teaching hospital. In my opinion taking her to surgery this week in the setting of a recent L PV thrombosis and on recent/current prednisone is too risky when she is not completely obstructed. Pt voiced understanding. Appreciates all the doctors and staff. Just wants to get back to her life. She is agreeable to transfer. i explained that we could not guarantee transfer but would try. Also discussed the possibility of outpt f/u  with [facility] or [facility] if inpatient can't be arranged. Discussed with Dr Mahala Menghini. Happy to discuss her case with surgeon at tertiary center.    2. 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 Darnelle Catalan yesterday and he recommended lifelong anti-coagulation 3. History of portal vein thrombosis - 10/07/2014 admission  coagulopathy, unknown etiology  currently eliquis held and patient started on IV heparin incase surgery is decided upon 4. Chronic pain syndrome Interstitial cystitis diagnosed at age 56 - she sees a urologist in [place], but she cannot remember his name. On Methadone, but she wants to get off of this. 5. Smokes 6. Obese - BMI 35.6 6. DVT prophylaxis - IV heparin drip  Total time 46 minutes  Mary Sella. Andrey Campanile, MD, FACS General, Bariatric, & Minimally Invasive Surgery [facility], [place]   LOS: 14 days    Atilano Ina 11/28/2014

## 2014-11-29 DIAGNOSIS — R1013 Epigastric pain: Secondary | ICD-10-CM

## 2014-11-29 MED ORDER — NICOTINE 21 MG/24HR TD PT24: 21.0000 mg | MEDICATED_PATCH | Freq: Every day | TRANSDERMAL | Status: DC

## 2014-11-29 MED ORDER — MUPIROCIN 2 % EX OINT
TOPICAL_OINTMENT | CUTANEOUS | Status: AC
  Filled 2014-11-29: qty 22

## 2014-11-29 MED ORDER — OXYCODONE-ACETAMINOPHEN 5-325 MG PO TABS: 1.0000 | ORAL_TABLET | ORAL | Status: DC | PRN

## 2014-11-29 MED ORDER — POLYETHYLENE GLYCOL 3350 17 G PO PACK: 17.0000 g | PACK | Freq: Every day | ORAL | Status: DC

## 2014-11-29 MED ORDER — POLYETHYLENE GLYCOL 3350 17 G PO PACK
17.0000 g | PACK | Freq: Every day | ORAL | Status: DC
  Administered 2014-11-29 – 2014-12-03 (×5): 17 g via ORAL
  Filled 2014-11-29 (×6): qty 1

## 2014-11-29 NOTE — Progress Notes (Signed)
[facility]                                                           24 y.o. 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  Ms. Infantino has been hospitalized 2x recently for similar symptoms.  She was feeling better after discharge 8/27 but still having cramping pain that was relieved with bowel movements.   9/4 she began having continuous abdominal pain not relieved with bowel movements. The pain is located thru out her abdomen. It stops her from eating. In the ER her lactic acid is 2.88, wbc is 11.6, CT abdomen/pelvis shows: circumferential wall thickening in the distal ileum. She was seen by GI and underwent colonoscopy with distal ileal stricture-possible crohbn's being entertained ANA was done 9/15 and was neg Gen surgery evlautating for possible surgery It was felt patient may benefit the most from being at a tertiary care facility and I called [facility] Accepting MD Dr. Earlie Server been Accepted to Gen Med 8 patient on the waitlist currently but will happen hopefully in the next couple of days  Assessment & Plan     Abdominal Pain secondary to Enteritis-? Crohn's vs other such as ischemic colitis? - CT abdomen showed enteritis involving the distal ileum with adjacent inflammation DDx inflammatory, infectious or Crohn's disease.  ESR/CRP wnl. - on Flagyl (allergy to Cipro).Patient started on prednisone by GI in case the ileal stricture represents IBD, no significant improvement in symptoms yet - Colonoscopy 9/9 showed tight but not complete distal ileum stricture that appears ulcerated, inflamed with distal and about 10-15 cm proximal to the IC valve, biopsies appear to be showing active inflammation vs Ischemic stricture -Gen surgery consulted 9/14 and non-operative  management advised as recent diagnosis Portal vein thrombosis 1 mo ago so risky to take off of AC for ~ 6 wk -CT angio abd essentially non revealing 9/17 -patient finalizeng her deicsions re; transfer to [facility]ry care center vs local OP surgical follow up in [place] for elective procedure once all the peri-op risks [was on steroids, recent Portal vn Thrombosis] are resolved - steroids cut back by GI 9/17 -flagyll d/c 9/13 -Was felt that the best option for the patient was tertiary care tx and this was arranged as above -she has been recommended as soft diet by Gen surgery :gen surgery has discussed this with her -we have started her on Miralax to ensure that she does pass stool  Lactic Acidosis -Resolved with 2L IVF.  Left Portal VeinThrombosis. - Diagnosed on 10/11/14, cont Eliquis. -patient claims compliance with meds -She is not a great candidate for surgery at presentwith poor PO intake and likely poor protein reserve  -non bloody stools  Cerebral Sinus Venous Thrombosis Resumed on Eliquis as no impending surgery in sight right away  no residual neurologic deficit per patient.  Seizure - Stable, continue Keppra 500 bid  Chronic Pain / Hx of interstitial cystitis Continue 85 mg methadone daily. Continue Vicodin as well -seen in [place] in the past by a Dr. Aldona Bar for this -apparently stable  Tobacco Abuse Nicotine Patch  History of depression  Appears stable, denies any suicidal/homicidal ideation. Continue Prozac 20 daily.  TSH 4.3, free T4 0.9   Questionable hypercoagulable state;  Hx of miscarriage, stroke, multiple thrombosis.  -Sucpsicion raised as patient has relative-Mother-with Lupus -Also has cousin with Crohn's -Given non-response to Steroids, likely would be beneficial to determine other etiologies such as Lupus enteritis which although Rare might be a unifying diagnosis-I have ordered an ANA which is pending -has been worked up for paroxysmal NH, myeloprolif disorder on  admission 10/2014 by Dr. Alvy Bimler -Jak-2 was neg -Protein S was diminished as well that admission -Cardiolipin IGm was ?  - Unfortunately  missed her appt, hematology to see on d/c oncology consulted by GI 9/17 for opinion and appreciate input-needs Life-long AC regardless of casue  Mild leukopenia: unclear significance, resolved.  Code Status: full code   Family Communication: Discussed in detail with the patient, all imaging results, lab results explained to the patient    Disposition Plan: not medically ready  Time Spent in minutes  25 minutes  Procedures  colonoscopy  Consults   GI   DVT Prophylaxis eliquis  Medications  Scheduled Meds: . apixaban  5 mg Oral BID  . FLUoxetine  20 mg Oral Daily  . levETIRAcetam  500 mg Oral BID  . methadone  85 mg Oral Daily  . nicotine  21 mg Transdermal Daily  . pantoprazole  40 mg Oral Q0600  . polyethylene glycol  17 g Oral Daily  . predniSONE  20 mg Oral Q breakfast   Continuous Infusions:  PRN Meds:.acetaminophen **OR** [DISCONTINUED] acetaminophen, alum & mag hydroxide-simeth, Influenza vac split quadrivalent PF, ondansetron **OR** ondansetron (ZOFRAN) IV, oxyCODONE-acetaminophen, sodium phosphate   Antibiotics   Anti-infectives    Start     Dose/Rate Route Frequency Ordered Stop   11/18/14 1400  metroNIDAZOLE (FLAGYL) tablet 500 mg  Status:  Discontinued     500 mg Oral 3 times per day 11/18/14 0939 11/22/14 1617   11/14/14 1530  metroNIDAZOLE (FLAGYL) IVPB 500 mg  Status:  Discontinued     500 mg 100 mL/hr over 60 Minutes Intravenous Every 8 hours 11/14/14 1429 11/18/14 0939        Subjective:   Bad pain day Ambulatory but not much Feels poorly Eating some only No cp No fever no chills  Objective:   Blood pressure 127/67, pulse 60, temperature 98.4 F (36.9 C), temperature source Oral, resp. rate 18, height 5' 3"  (1.6 m), weight 91.173 kg (201 lb), SpO2 98 %, not currently breastfeeding.  Wt Readings  from Last 3 Encounters:  11/14/14 91.173 kg (201 lb)  11/06/14 91 kg (200 lb 9.9 oz)  10/07/14 77.111 kg (170 lb)     Intake/Output Summary (Last 24 hours) at 11/29/14 1425 Last data filed at 11/29/14 0547  Gross per 24 hour  Intake   1440 ml  Output      0 ml  Net   1440 ml    Exam  General: Alert and oriented x 3, NAD  HEENT:  PERRLA, EOMI, Mild abdominal tenderness, no rebound no guard Chest clear   Data Review   Micro Results No results found for this or any previous visit (from the past 240 hour(s)).  Radiology Reports Dg Abd 1 View  11/15/2014   CLINICAL DATA:  Lower abdominal pain  EXAM: ABDOMEN - 1 VIEW  COMPARISON:  Abdominal radiographs October 14, 2014; CT abdomen and pelvis November 14, 2014  FINDINGS: There is moderate stool in the colon. There  is no bowel dilatation or air-fluid level suggesting obstruction. No free air is seen on this supine examination. There are surgical clips in the left pelvic region.  IMPRESSION: Bowel gas pattern overall unremarkable.   Electronically Signed   By: Lowella Grip III M.D.   On: 11/15/2014 12:45   Ct Abdomen Pelvis W Contrast  11/14/2014   CLINICAL DATA:  24 year old female with acute abdominal and pelvic pain with vomiting for 1 day.  EXAM: CT ABDOMEN AND PELVIS WITH CONTRAST  TECHNIQUE: Multidetector CT imaging of the abdomen and pelvis was performed using the standard protocol following bolus administration of intravenous contrast.  CONTRAST:  165m OMNIPAQUE IOHEXOL 300 MG/ML  SOLN  COMPARISON:  11/03/2014 and prior CTs  FINDINGS: Lower chest:  Minimal basilar atelectasis noted.  Hepatobiliary: No significant hepatic or gallbladder abnormalities. There is no evidence of biliary dilatation.  Pancreas: Unremarkable  Spleen: Unremarkable  Adrenals/Urinary Tract: Kidneys, adrenal glands and bladder are unremarkable.  Stomach/Bowel: There is circumferential wall thickening of a moderate length segment distal ileum with adjacent  inflammation and small amount of interloop fluid. There is mild distention of small bowel loops just proximal to the moderate segment wall thickening. There is no evidence of abscess or pneumoperitoneum. The appendix is normal.  Vascular/Lymphatic: Prominent mesenteric lymph nodes are likely reactive. There is no evidence of vascular abnormality.  Reproductive: Uterus and adnexal regions are unremarkable.  Other: A trace amount of free pelvic fluid is noted.  Musculoskeletal: No acute or suspicious abnormalities identified.  IMPRESSION: Enteritis involving the distal ileum with adjacent inflammation and small amount of fluid. This likely is inflammatory, infectious or Crohn's disease. No evidence of pneumoperitoneum or abscess.   Electronically Signed   By: JMargarette CanadaM.D.   On: 11/14/2014 10:28   Ct Abdomen Pelvis W Contrast  11/02/2014   CLINICAL DATA:  Abdominal pain since leaving the hospital a few weeks ago. 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  EXAM: CT ABDOMEN AND PELVIS WITH CONTRAST  TECHNIQUE: Multidetector CT imaging of the abdomen and pelvis was performed using the standard protocol following bolus administration of intravenous contrast.  CONTRAST:  1038mOMNIPAQUE IOHEXOL 300 MG/ML  SOLN  COMPARISON:  10/11/2014  FINDINGS: 3 mm nodule in the left lung base without change since prior study.  Small focal fatty infiltration in the liver adjacent to the falciform ligament. Tiny sub cm low-attenuation lesion in the medial segment left lobe unchanged since prior study. This too small to characterize but probably represents a cyst or hemangioma. No other focal liver lesions. Gallbladder, spleen, pancreas, adrenal glands, kidneys, abdominal aorta, inferior vena cava, and retroperitoneal lymph nodes are unremarkable. Stomach, small bowel, and colon are not abnormally distended. Residual thickening of the wall of the distal ileum. This could represent residua of previous vascular compromise or  inflammatory change. Visualized portions of the superior mesenteric artery and vein appear patent. No significant residual portal venous thrombosis. Small amount of free fluid in the lower abdominal mesenteric in the area of thickened bowel. No pneumatosis or portal venous gas. No free air. Mild prominence of mesenteric lymph nodes, probably reactive.  Pelvis: Small amount of free fluid in the pelvis could be reactive or physiologic. Uterus and ovaries are not enlarged. Mild bladder wall thickening may be due to under distention or cystitis. Appendix is normal. No pelvic mass or lymphadenopathy. No destructive bone lesions.  IMPRESSION: Residual bowel wall thickening in the low abdomen with small amount of adjacent free fluid. This  could represent residual vascular compromise or inflammatory process. The previous portal venous thrombosis has resolved.   Electronically Signed   By: Lucienne Capers M.D.   On: 11/02/2014 23:59   Dg Abd Portable 1v  11/22/2014   CLINICAL DATA:  Right lower quadrant pain with associated mild generalized abdominal pain.  EXAM: PORTABLE ABDOMEN - 1 VIEW  COMPARISON:  11/17/2014  FINDINGS: The bowel gas pattern is nonobstructive. There is a long tubular radiolucent structure which seen to arise from the cecum, with associated surrounding soft tissue thickening.  No radio-opaque calculi or other significant radiographic abnormality are seen. Moderate amount of stool throughout the colon. No radiographic evidence of organomegaly. Osseous structures are grossly normal.  IMPRESSION: Nonobstructive bowel gas pattern with evidence of constipation.  Long tubular structure which seems to arise from wall are overlie the distal cecum. This may represent a gas containing thickened distal ileum, or abnormal appendix. In correlation to CT of the abdomen dated 11/14/2014, there is no evidence of appendicitis, and the distal ileum appears inflamed. Therefore, this radiographic finding likely  correlates to the segment of abnormal ileum. Radiographic or CT follow-up is recommended.  These results will be called to the ordering clinician or representative by the Radiologist Assistant, and communication documented in the PACS or zVision Dashboard.   Electronically Signed   By: Fidela Salisbury M.D.   On: 11/22/2014 12:15   Ct Angio Abd/pel W/ And/or W/o  11/24/2014   CLINICAL DATA:  History of Crohn's disease. Evaluate for portal vein thrombosis.  EXAM: CT ANGIOGRAPHY ABDOMEN AND PELVIS WITH CONTRAST AND WITHOUT CONTRAST  TECHNIQUE: Multidetector CT imaging of the abdomen and pelvis was performed using the standard protocol during bolus administration of intravenous contrast. Multiplanar reconstructed images including MIPs were obtained and reviewed to evaluate the vascular anatomy.  CONTRAST:  192m OMNIPAQUE IOHEXOL 350 MG/ML SOLN  COMPARISON:  CT abdomen pelvis - 11/14/2014  FINDINGS: Vascular Findings:  Abdominal aorta: There is no atherosclerotic plaque within a normal caliber abdominal aorta. No abdominal aortic dissection No aortic wall thickening or periaortic stranding.  Celiac artery: Widely patent.  Conventional branching pattern.  SMA: Widely patent. 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 The distal tributaries of the SMA are widely patent without discrete intraluminal filling defect to suggest distal embolism.  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 The dominant cranial right-sided renal artery is widely patent without a hemodynamically significant stenosis. No vessel irregularity to suggest FMD.  Left Renal artery: Duplicated; there is an accessory left renal artery which supplies the inferior pole of the left kidney. The dominant left-sided renal artery is widely patent without a  hemodynamically significant stenosis. No vessel irregularity to suggest FMD.  IMA: Widely patent without hemodynamically significant stenosis.  Pelvic vasculature: The bilateral common, external and internal iliac arteries are of normal caliber and widely patent without hemodynamically significant stenosis.  Venous Findings: The portal vein is widely patent. The IVC and bilateral pelvic venous systems are widely patent.  Review of the MIP images confirms the above findings.   --------------------------------------------------------------------------------  Nonvascular Findings:  Normal hepatic contour. There is a minimal amount of focal fatty infiltration adjacent to the fissure for ligamentum teres. No discrete hepatic lesions. Normal appearance of the gallbladder. No intra extrahepatic biliary duct dilatation. No ascites.  There is symmetric enhancement and excretion of the bilateral kidneys. No definite renal stones in this postcontrast examination. No discrete renal lesions. No urinary obstruction or perinephric stranding. Normal appearance of the bilateral adrenal glands, pancreas and spleen.  Note is again made of short segment bowel wall thickening involving an approximately 20 cm segment of the distal ileum though sparing the terminal ileum (representative images 57 through 62, series 11). This finding is associated with mild upstream distension of the adjacent distal ileum (representative image 42, series 11) but not resulting in enteric obstruction. The amount of previously noted adjacent mesenteric stranding and reactive adenopathy within the adjacent mesenteries has decreased in the interval. No evidence of perforation or definable/drainable fluid collection. No pneumoperitoneum, pneumatosis or portal venous gas.  Large colonic stool burden. Normal appearance of the retrocecal appendix.  No bulky retroperitoneal, mesenteric, pelvic or inguinal lymphadenopathy.  Normal appearance of the pelvic organs. No  discrete adnexal lesion. Normal appearance of the urinary bladder given underdistention. No free fluid in the pelvic cul-de-sac.  Limited visualization of lower thorax demonstrates minimal subsegmental atelectasis within the image caudal aspect the left lower lobe. No focal airspace opacities. No pleural effusion. Normal heart size. No pericardial effusion.  No acute or aggressive osseous abnormalities. Note is made of mild bilateral symmetric sacroiliitis potentially attributable to provided history of Crohn's disease.  Tiny mesenteric fat containing periumbilical hernia. There is a minimal amount of soft tissue stranding about the midline of the low back.  IMPRESSION: Vascular Impression:  1. Unremarkable CTA of the abdomen and pelvis. No evidence of mesenteric ischemia or portal vein thrombosis. 2. 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 Nonvascular Impression:  1. Minimally improved bowel wall thickening involving an approximately 20 cm segment of the distal ileum with sparing of the TI and cecum, findings compatible with provided history of Crohn's disease. There is mild upstream distention of the adjacent small bowel without evidence of enteric obstruction. No evidence of perforation or definable/drainable fluid collection. 2. Suspected mild bilateral symmetric sacroiliitis, potentially attributable to provided history of Crohn's disease.   Electronically Signed   By: Sandi Mariscal M.D.   On: 11/24/2014 16:37   Ct Angio Abd/pel W/ And/or W/o  11/03/2014   CLINICAL DATA:  Severe left lower quadrant pain, and nausea. Patient with portal venous and cerebral venous thrombosis.  EXAM: CT ANGIOGRAPHY ABDOMEN AND PELVIS WITH CONTRAST AND WITHOUT CONTRAST  TECHNIQUE: Multidetector CT imaging of the abdomen and pelvis was performed using the standard protocol during bolus  administration of intravenous contrast. Multiplanar reconstructed images including MIPs were obtained and reviewed to evaluate the vascular anatomy.  CONTRAST:  100 cc Omnipaque 350.  COMPARISON:  CT of the abdomen pelvis dated 11/02/2014  FINDINGS: Arterial findings  Aorta  Normal in caliber, without filling defects or stenosis.  Celiac axis  Normal in caliber, without filling defects or stenosis.  Superior mesenteric  Normal in caliber, without filling defects or stenosis.  Left renal  Normal in caliber, without filling defects or stenosis. Accessory left renal artery is noted.  Right renal  Normal in caliber, without filling defects or stenosis.  Inferior mesenteric  Normal in caliber, without filling defects or stenosis.  Left iliac  Normal in caliber, without filling defects or stenosis.  Right iliac  Normal in caliber, without filling defects or stenosis.  Venous findings  No evidence of portal venous thrombosis.  Review of the  MIP images confirms the above findings.  Nonvascular findings  There is mesenteric stranding and small amount of free fluid within the mid left abdomen. There is also a long segment of bowel thickening of the distal third of the ileum. The distal most ileum is noted to be distended and fluid-filled with maximum diameter of 2.7 cm. The terminal ileum is prominent. The appendix is normal. The remainder of the upper GI tract and colon are normal. Multiple sub cm rounded mesenteric lymph nodes are seen within the same area of the abdomen. There is a tiny amount of free fluid, tracking along the left pericolic gutter. There is a small amount of free fluid within the pelvis as well.  Focal fatty infiltration along the falciform ligament of the liver is noted, otherwise the liver appears normal. The spleen, pancreas, bilateral adrenal glands, bilateral kidneys are normal. Vicarious excretion of contrast within the otherwise normal gallbladder is noted.  IMPRESSION: No evidence of a arterial or  venous thrombosis.  Increased mesenteric stranding with interspersed shotty mesenteric lymph nodes within the mid left abdomen, in the area of long segment of thickening of the distal ileum. Thickening of the terminal ileum and a focal distention of up to 2.7 cm of the distal ileum leading to it. These findings may represent infectious, inflammatory or ischemic bowel changes, with associated reactive lymphadenopathy. Alternatively mesenteric adenitis is of consideration.   Electronically Signed   By: Fidela Salisbury M.D.   On: 11/03/2014 11:42    CBC  Recent Labs Lab 11/23/14 0340 11/24/14 0420 11/27/14 0945 11/28/14 0332  WBC 5.9 7.8 7.4 9.6  HGB 12.4 11.6* 11.6* 11.5*  HCT 39.0 36.3 37.5 35.4*  PLT 249 249 250 240  MCV 73.3* 73.9* 74.9* 74.5*  MCH 23.3* 23.6* 23.2* 24.2*  MCHC 31.8 32.0 30.9 32.5  RDW 25.8* 25.9* 25.7* 25.9*  LYMPHSABS  --   --  2.0  --   MONOABS  --   --  0.4  --   EOSABS  --   --  0.1  --   BASOSABS  --   --  0.0  --     Chemistries   Recent Labs Lab 11/24/14 0420 11/27/14 0945  NA 137 136  K 3.2* 3.9  CL 103 101  CO2 26 24  GLUCOSE 123* 94  BUN 10 11  CREATININE 0.86 0.68  CALCIUM 9.0 8.3*  AST 25 25  ALT 24 19  ALKPHOS 47 41  BILITOT 0.5 0.6   ------------------------------------------------------------------------------------------------------------------ estimated creatinine clearance is 115.2 mL/min (by C-G formula based on Cr of 0.68). ------------------------------------------------------------------------------------------------------------------ No results for input(s): HGBA1C in the last 72 hours. ------------------------------------------------------------------------------------------------------------------ No results for input(s): CHOL, HDL, LDLCALC, TRIG, CHOLHDL, LDLDIRECT in the last 72 hours. ------------------------------------------------------------------------------------------------------------------ No results for  input(s): TSH, T4TOTAL, T3FREE, THYROIDAB in the last 72 hours.  Invalid input(s): FREET3 ------------------------------------------------------------------------------------------------------------------ No results for input(s): VITAMINB12, FOLATE, FERRITIN, TIBC, IRON, RETICCTPCT in the last 72 hours.  Coagulation profile  Recent Labs Lab 11/24/14 0420  INR 1.18    No results for input(s): DDIMER in the last 72 hours.  Cardiac Enzymes No results for input(s): CKMB, TROPONINI, MYOGLOBIN in the last 168 hours.  Invalid input(s): CK ------------------------------------------------------------------------------------------------------------------ Invalid input(s): POCBNP  No results for input(s): GLUCAP in the last 72 hours.  Verneita Griffes, MD [facility] [phone]

## 2014-11-29 NOTE — Progress Notes (Signed)
Patient ID: Victoria Alvarez, female   DOB: 10/17/1990, 24 y.o.   MRN: [id number] 11 Days Post-Op  Subjective: Pt still with pain.  Objective: Vital signs in last 24 hours: Temp:  [98.4 F (36.9 C)-98.9 F (37.2 C)] 98.4 F (36.9 C) (09/20 0547) Pulse Rate:  [60-90] 60 (09/20 0547) Resp:  [18-19] 18 (09/20 0547) BP: (127-129)/(67-71) 127/67 mmHg (09/20 0547) SpO2:  [98 %] 98 % (09/20 0547) Last BM Date: 11/26/14  Intake/Output from previous day: 09/19 0701 - 09/20 0700 In: 1440 [P.O.:1440] Out: -  Intake/Output this shift:    PE: Abd: soft, tender mostly on right side, +Bs, obese  Lab Results:   Recent Labs  11/27/14 0945 11/28/14 0332  WBC 7.4 9.6  HGB 11.6* 11.5*  HCT 37.5 35.4*  PLT 250 240   BMET  Recent Labs  11/27/14 0945  NA 136  K 3.9  CL 101  CO2 24  GLUCOSE 94  BUN 11  CREATININE 0.68  CALCIUM 8.3*   PT/INR No results for input(s): LABPROT, INR in the last 72 hours. CMP     Component Value Date/Time   NA 136 11/27/2014 0945   K 3.9 11/27/2014 0945   CL 101 11/27/2014 0945   CO2 24 11/27/2014 0945   GLUCOSE 94 11/27/2014 0945   BUN 11 11/27/2014 0945   CREATININE 0.68 11/27/2014 0945   CREATININE 0.81 07/12/2014 1717   CALCIUM 8.3* 11/27/2014 0945   PROT 4.5* 11/27/2014 0945   ALBUMIN 2.9* 11/27/2014 0945   AST 25 11/27/2014 0945   ALT 19 11/27/2014 0945   ALKPHOS 41 11/27/2014 0945   BILITOT 0.6 11/27/2014 0945   GFRNONAA >60 11/27/2014 0945   GFRNONAA >89 07/12/2014 1717   GFRAA >60 11/27/2014 0945   GFRAA >89 07/12/2014 1717   Lipase     Component Value Date/Time   LIPASE 30 11/14/2014 0726       Studies/Results: No results found.  Anti-infectives: Anti-infectives    Start     Dose/Rate Route Frequency Ordered Stop   11/18/14 1400  metroNIDAZOLE (FLAGYL) tablet 500 mg  Status:  Discontinued     500 mg Oral 3 times per day 11/18/14 0939 11/22/14 1617   11/14/14 1530  metroNIDAZOLE (FLAGYL) IVPB 500 mg  Status:   Discontinued     500 mg 100 mL/hr over 60 Minutes Intravenous Every 8 hours 11/14/14 1429 11/18/14 0939       Assessment/Plan   1. 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 Adela Lank Biopsies from 9/9 were nondiagnostic - they cannot tell whether this is ischemic vs inflammatory bowel disease vs some other etiology On prednisone - 20 mg QD, on taper This is a difficult problem. The patient has a potential underlying coagulopathy. She saw Dr. Bertis Ruddy on 10/12/2014, but missed follow up. She has a stricture of TI, but non obstructing. The cause of the stricture is uncler. And her pain is difficult to read because of her underlying "pain syndrome".  She does not need acute surgery. I would also be very careful offering surgery as a cure for abdominal pain without other specific finding (i.e., abscess, complete obstruction) in a patient who already has pain issues.  i have reviewed her CTs, hospital dc summaries and colonoscopy. i think this section of bowel will need to be resected but the million dollar question is timing of surgery. She is not clinically obstructed. She has no fever, wbc. I think she needs  to be optimized for surgery - 2 weeks off of prednisone, 6 weeks since resolution of L PV thrombosis in my opinion. It was no longer present on a Ct in late august. I personally believe she would be best served and managed at a teaching hospital. In my opinion taking her to surgery this week in the setting of a recent L PV thrombosis and on recent/current prednisone is too risky when she is not completely obstructed. Pt voiced understanding. Appreciates all the doctors and staff. Just wants to get back to her life. She is agreeable to transfer. i explained that we could not guarantee transfer but would try. Also discussed the possibility of outpt f/u with [facility] or [facility] if inpatient can't be arranged.  Discussed with Dr Mahala Menghini. Happy to discuss her case with surgeon at tertiary center.   -no other new recommendations today.  2. 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 Darnelle Catalan yesterday and he recommended lifelong anti-coagulation 3. History of portal vein thrombosis - 10/07/2014 admission  coagulopathy, unknown etiology currently eliquis held and patient started on IV heparin incase surgery is decided upon 4. Chronic pain syndrome Interstitial cystitis diagnosed at age 23 - she sees a urologist in [place], but she cannot remember his name. On Methadone, but she wants to get off of this. 5. Smokes 6. Obese - BMI 35.6 6. DVT prophylaxis - IV heparin drip  LOS: 15 days    OSBORNE,KELLY E 11/29/2014, 10:03 AM Pager: [phone]

## 2014-11-29 NOTE — Consult Note (Signed)
werror   

## 2014-11-29 NOTE — Progress Notes (Signed)
     [facility] Progress Note  Subjective:   Continues to have RLQ pain.On soft diet. [place] [employer] [place] and [place] sub. Dev worsening pain last night. Has not vomited.For transfer to tertiary center.   Objective:  Vital signs in last 24 hours: Temp:  [98.4 F (36.9 C)-98.9 F (37.2 C)] 98.4 F (36.9 C) (09/20 0547) Pulse Rate:  [60-90] 60 (09/20 0547) Resp:  [18-19] 18 (09/20 0547) BP: (127-129)/(67-71) 127/67 mmHg (09/20 0547) SpO2:  [98 %] 98 % (09/20 0547) Last BM Date: 11/26/14 General: Alert, Well-developed, in NAD Heart: Regular rate and rhythm; no murmurs Pulm;lungs clear Abdomen: Soft, TTP RLQ, no rebound or guarding, Normal bowel sounds.  Extremities: Without edema. Neurologic:Alert and oriented x4; grossly normal neurologically. Psych: Alert and cooperative. Normal mood and affect.  Intake/Output from previous day: 09/19 0701 - 09/20 0700 In: 1440 [P.O.:1440] Out: -  Intake/Output this shift:    Lab Results:  Recent Labs  11/27/14 0945 11/28/14 0332  WBC 7.4 9.6  HGB 11.6* 11.5*  HCT 37.5 35.4*  PLT 250 240   BMET  Recent Labs  11/27/14 0945  NA 136  K 3.9  CL 101  CO2 24  GLUCOSE 94  BUN 11  CREATININE 0.68  CALCIUM 8.3*   LFT  Recent Labs  11/27/14 0945  PROT 4.5*  ALBUMIN 2.9*  AST 25  ALT 19  ALKPHOS 41  BILITOT 0.6     ASSESSMENT/PLAN:   24 y/o female with chronic abdominal pain, who presented with ileitis / stricture of the ileum, noted on imaging and endoscopy. Biopsies show chronic active ileitis however differential includes ischemic, vasculitis, infectious, less likely lymphoma, etc. Infectious workup negative. We have treated her empirically for Crohns however has had no benefit with steroids. Given her ESR and WBC are normal, no improvement in steroids, and her prior thrombosis history with ?ischemia of the bowel, I? the diagnosis of Crohns although it remains on the  differential. CTA was done to evaluate the vasculature and the mesenteric vessels are patent, although her stricture could have represented a prior ischemic insult.  If this is Crohns, given her failure of steroids, options to induce remission include anti-TNF, surgery, and cyclosporine.Given she isn't responding to steroids and her course seems a bit atypical for Crohns, surgery up front to resect the short segment to more definitively make a diagnosis is not unreasonable, although she is higher than average risk for surgery.Pt has requested transfer to [facility] and is currently on the wait list. Not much for GI to offer at this point. Will sign off.     LOS: 15 days   Hvozdovic, Deloris Ping 11/29/2014, Pager [phone]

## 2014-11-30 NOTE — Discharge Summary (Signed)
Physician Discharge Summary  Victoria Alvarez LOV:[id number] DOB: 1990-09-03 DOA: 11/14/2014  PCP: Lora Paula, MD  Admit date: 11/14/2014 Discharge date: 11/30/2014  Time spent: 60 minutes  Recommendations for Outpatient Follow-up:  1. 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   The patient presented to the ER on 9/5 for abdominal pain. This was associated with nausea and vomiting. She was found to have lactic acidosis. Recent hospitalization 7/30 through 8/7 with abdominal pain as well.   She was evaluated by GI and underwent a colonoscopy which revealed a distal ileal stricture and possibly inflammatory bowel disease. General surgery and hematology oncology are involved in the case as well. Fortunately her GI symptoms have not improved  Hospital Course:  Abdominal pain/ileitis/stricture of the ileum -11/18/14 underwent colonoscopy - Biopsy shows chronic active ileitis. GI is following. She has been treated empirically for Crohn's with steroids but no improvement has been seen in symptoms of abdominal pain -CTA performed to evaluate vasculature and mesenteric vessels-see report below -Remains unclear whether she's had a vascular insult or whether this is inflammatory bowel disease -General surgery feels that resection of the strictured bowel may be necessary -this is clearly going to be  complicated due to the fact that she is on anticoagulation and may have an underlying hypercoagulable state -Per surgical notes, recommendations are for her to be optimized for surgery with discontinuing prednisone for 2 weeks and awaiting until she is 6 weeks out from her L PV thrombosis -The patient has requested a transfer to [facility] for another opinion-GI and surgery are in agreement with this plan  -Prednisone currently at 20 mg daily  History of CVA/portal venous thrombosis 10/07/14/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 The most terminal ileum was normal. 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 The most distal aspect was biopsied extensively. The examination was otherwise normal  Consultations:  GI  Gen. Surgery  Hematology/oncology   Discharge Exam: Filed Weights   11/14/14 0715 11/14/14 1432  Weight: 91 kg (200 lb 9.9 oz) 91.173 kg (201 lb)   Filed Vitals:   11/30/14 1444  BP: 112/63  Pulse: 86  Temp: 99.1 F (37.3 C)  Resp: 18    General: AAO x 3, no distress Cardiovascular: RRR, no murmurs  Respiratory: clear to auscultation bilaterally GI: soft, non-tender, non-distended, bowel sound positive  Discharge Instructions You were cared for by a hospitalist during your hospital stay. If you  have any questions about your discharge  medications or the care you received while you were in the hospital after you are discharged, you can call the unit and asked to speak with the hospitalist on call if the hospitalist that took care of you is not available. Once you are discharged, your primary care physician will handle any further medical issues. Please note that NO REFILLS for any discharge medications will be authorized once you are discharged, as it is imperative that you return to your primary care physician (or establish a relationship with a primary care physician if you do not have one) for your aftercare needs so that they can reassess your need for medications and monitor your lab values.     Medication List    STOP taking these medications        Linaclotide 145 MCG Caps capsule  Commonly known as:  LINZESS      TAKE these medications        apixaban 5 MG Tabs tablet  Commonly known as:  ELIQUIS  Take 1 tablet (5 mg total) by mouth 2 (two) times daily.     EPINEPHrine 0.3 mg/0.3 mL Soaj injection  Commonly known as:  EPI-PEN  Inject 0.3 mLs (0.3 mg total) into the muscle once.     FLUoxetine 20 MG capsule  Commonly known as:  PROZAC  Take 1 capsule (20 mg total) by mouth daily.     levETIRAcetam 500 MG tablet  Commonly known as:  KEPPRA  Take 1 tablet (500 mg total) by mouth 2 (two) times daily.     methadone 5 MG tablet  Commonly known as:  DOLOPHINE  Take 17 tablets (85 mg total) by mouth daily. Home medication continued     nicotine 21 mg/24hr patch  Commonly known as:  NICODERM CQ - dosed in mg/24 hours  Place 1 patch (21 mg total) onto the skin daily.     oxyCODONE-acetaminophen 5-325 MG per tablet  Commonly known as:  PERCOCET/ROXICET  Take 1-2 tablets by mouth every 4 (four) hours as needed for moderate pain or severe pain.     polyethylene glycol packet  Commonly known as:  MIRALAX / GLYCOLAX  Take 17 g by mouth daily.     polyethylene glycol packet  Commonly known as:  MIRALAX /  GLYCOLAX  Take 17 g by mouth daily.       Allergies  Allergen Reactions  . Ativan [Lorazepam] Other (See Comments)    " I don't remember anything."  . Bee Venom Swelling  . Ciprofloxacin Diarrhea and Nausea And Vomiting      The results of significant diagnostics from this hospitalization (including imaging, microbiology, ancillary and laboratory) are listed below for reference.    Significant Diagnostic Studies: Dg Abd 1 View  11/15/2014   CLINICAL DATA:  Lower abdominal pain  EXAM: ABDOMEN - 1 VIEW  COMPARISON:  Abdominal radiographs October 14, 2014; CT abdomen and pelvis November 14, 2014  FINDINGS: There is moderate stool in the colon. There is no bowel dilatation or air-fluid level suggesting obstruction. No free air is seen on this supine examination. There are surgical clips in the left pelvic region.  IMPRESSION: Bowel gas pattern overall unremarkable.   Electronically Signed   By: Bretta Bang III M.D.   On: 11/15/2014 12:45   Ct Abdomen Pelvis W Contrast  11/14/2014   CLINICAL DATA:  24 year old female with acute abdominal and pelvic pain with vomiting for 1 day.  EXAM:  CT ABDOMEN AND PELVIS WITH CONTRAST  TECHNIQUE: Multidetector CT imaging of the abdomen and pelvis was performed using the standard protocol following bolus administration of intravenous contrast.  CONTRAST:  OMNIPAQUE IOHEXOL 300 MG/ML  SOLN  COMPARISON:  11/03/2014 and prior CTs  FINDINGS: Lower chest:  Minimal basilar atelectasis noted.  Hepatobiliary: No significant hepatic or gallbladder abnormalities. There is no evidence of biliary dilatation.  Pancreas: Unremarkable  Spleen: Unremarkable  Adrenals/Urinary Tract: Kidneys, adrenal glands and bladder are unremarkable.  Stomach/Bowel: There is circumferential wall thickening of a moderate length segment distal ileum with adjacent inflammation and small amount of interloop fluid. There is mild distention of small bowel loops just proximal to the moderate  segment wall thickening. There is no evidence of abscess or pneumoperitoneum. The appendix is normal.  Vascular/Lymphatic: Prominent mesenteric lymph nodes are likely reactive. There is no evidence of vascular abnormality.  Reproductive: Uterus and adnexal regions are unremarkable.  Other: A trace amount of free pelvic fluid is noted.  Musculoskeletal: No acute or suspicious abnormalities identified.  IMPRESSION: Enteritis involving the distal ileum with adjacent inflammation and small amount of fluid. This likely is inflammatory, infectious or Crohn's disease. No evidence of pneumoperitoneum or abscess.   Electronically Signed   By: Harmon Pier M.D.   On: 11/14/2014 10:28   Ct Abdomen Pelvis W Contrast  11/02/2014   CLINICAL DATA:  Abdominal pain since leaving the hospital a few weeks ago. 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  EXAM: CT ABDOMEN AND PELVIS WITH CONTRAST  TECHNIQUE: Multidetector CT imaging of the abdomen and pelvis was performed using the standard protocol following bolus administration of intravenous contrast.  CONTRAST:  OMNIPAQUE IOHEXOL 300 MG/ML  SOLN  COMPARISON:  10/11/2014  FINDINGS: 3 mm nodule in the left lung base without change since prior study.  Small focal fatty infiltration in the liver adjacent to the falciform ligament. Tiny sub cm low-attenuation lesion in the medial segment left lobe unchanged since prior study. This too small to characterize but probably represents a cyst or hemangioma. No other focal liver lesions. Gallbladder, spleen, pancreas, adrenal glands, kidneys, abdominal aorta, inferior vena cava, and retroperitoneal lymph nodes are unremarkable. Stomach, small bowel, and colon are not abnormally distended. Residual thickening of the wall of the distal ileum. This could represent residua of previous vascular compromise or inflammatory change. Visualized portions of the superior mesenteric artery and vein appear patent. No significant residual  portal venous thrombosis. Small amount of free fluid in the lower abdominal mesenteric in the area of thickened bowel. No pneumatosis or portal venous gas. No free air. Mild prominence of mesenteric lymph nodes, probably reactive.  Pelvis: Small amount of free fluid in the pelvis could be reactive or physiologic. Uterus and ovaries are not enlarged. Mild bladder wall thickening may be due to under distention or cystitis. Appendix is normal. No pelvic mass or lymphadenopathy. No destructive bone lesions.  IMPRESSION: Residual bowel wall thickening in the low abdomen with small amount of adjacent free fluid. This could represent residual vascular compromise or inflammatory process. The previous portal venous thrombosis has resolved.   Electronically Signed   By: Burman Nieves M.D.   On: 11/02/2014 23:59   Dg Abd Portable 1v  11/22/2014   CLINICAL DATA:  Right lower quadrant pain with associated mild generalized abdominal pain.  EXAM: PORTABLE ABDOMEN - 1 VIEW  COMPARISON:  11/17/2014  FINDINGS: The bowel gas pattern is nonobstructive. There is a long tubular radiolucent structure  which seen to arise from the cecum, with associated surrounding soft tissue thickening.  No radio-opaque calculi or other significant radiographic abnormality are seen. Moderate amount of stool throughout the colon. No radiographic evidence of organomegaly. Osseous structures are grossly normal.  IMPRESSION: Nonobstructive bowel gas pattern with evidence of constipation.  Long tubular structure which seems to arise from wall are overlie the distal cecum. This may represent a gas containing thickened distal ileum, or abnormal appendix. In correlation to CT of the abdomen dated 11/14/2014, there is no evidence of appendicitis, and the distal ileum appears inflamed. Therefore, this radiographic finding likely correlates to the segment of abnormal ileum. Radiographic or CT follow-up is recommended.  These results will be called to the  ordering clinician or representative by the Radiologist Assistant, and communication documented in the PACS or zVision Dashboard.   Electronically Signed   By: Ted Mcalpine M.D.   On: 11/22/2014 12:15   Ct Angio Abd/pel W/ And/or W/o  11/24/2014   CLINICAL DATA:  History of Crohn's disease. Evaluate for portal vein thrombosis.  EXAM: CT ANGIOGRAPHY ABDOMEN AND PELVIS WITH CONTRAST AND WITHOUT CONTRAST  TECHNIQUE: Multidetector CT imaging of the abdomen and pelvis was performed using the standard protocol during bolus administration of intravenous contrast. Multiplanar reconstructed images including MIPs were obtained and reviewed to evaluate the vascular anatomy.  CONTRAST:  OMNIPAQUE IOHEXOL 350 MG/ML SOLN  COMPARISON:  CT abdomen pelvis - 11/14/2014  FINDINGS: Vascular Findings:  Abdominal aorta: There is no atherosclerotic plaque within a normal caliber abdominal aorta. No abdominal aortic dissection No aortic wall thickening or periaortic stranding.  Celiac artery: Widely patent.  Conventional branching pattern.  SMA: Widely patent. 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 The distal tributaries of the SMA are widely patent without discrete intraluminal filling defect to suggest distal embolism.  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 The dominant cranial right-sided renal artery is widely patent without a hemodynamically significant stenosis. No vessel irregularity to suggest FMD.  Left Renal artery: Duplicated; there is an accessory left renal artery which supplies the inferior pole of the left kidney. The dominant left-sided renal artery is widely patent without a hemodynamically significant stenosis. No vessel irregularity to suggest FMD.  IMA: Widely patent without hemodynamically  significant stenosis.  Pelvic vasculature: The bilateral common, external and internal iliac arteries are of normal caliber and widely patent without hemodynamically significant stenosis.  Venous Findings: The portal vein is widely patent. The IVC and bilateral pelvic venous systems are widely patent.  Review of the MIP images confirms the above findings.   --------------------------------------------------------------------------------  Nonvascular Findings:  Normal hepatic contour. There is a minimal amount of focal fatty infiltration adjacent to the fissure for ligamentum teres. No discrete hepatic lesions. Normal appearance of the gallbladder. No intra extrahepatic biliary duct dilatation. No ascites.  There is symmetric enhancement and excretion of the bilateral kidneys. No definite renal stones in this postcontrast examination. No discrete renal lesions. No urinary obstruction or perinephric stranding. Normal appearance of the bilateral adrenal glands, pancreas and spleen.  Note is again made of short segment bowel wall thickening involving an approximately 20 cm segment of the distal ileum though sparing the terminal ileum (representative images 57 through 62, series 11). This finding is associated with mild upstream distension of  the adjacent distal ileum (representative image 42, series 11) but not resulting in enteric obstruction. The amount of previously noted adjacent mesenteric stranding and reactive adenopathy within the adjacent mesenteries has decreased in the interval. No evidence of perforation or definable/drainable fluid collection. No pneumoperitoneum, pneumatosis or portal venous gas.  Large colonic stool burden. Normal appearance of the retrocecal appendix.  No bulky retroperitoneal, mesenteric, pelvic or inguinal lymphadenopathy.  Normal appearance of the pelvic organs. No discrete adnexal lesion. Normal appearance of the urinary bladder given underdistention. No free fluid in the pelvic  cul-de-sac.  Limited visualization of lower thorax demonstrates minimal subsegmental atelectasis within the image caudal aspect the left lower lobe. No focal airspace opacities. No pleural effusion. Normal heart size. No pericardial effusion.  No acute or aggressive osseous abnormalities. Note is made of mild bilateral symmetric sacroiliitis potentially attributable to provided history of Crohn's disease.  Tiny mesenteric fat containing periumbilical hernia. There is a minimal amount of soft tissue stranding about the midline of the low back.  IMPRESSION: Vascular Impression:  1. Unremarkable CTA of the abdomen and pelvis. No evidence of mesenteric ischemia or portal vein thrombosis. 2. 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 Nonvascular Impression:  1. Minimally improved bowel wall thickening involving an approximately 20 cm segment of the distal ileum with sparing of the TI and cecum, findings compatible with provided history of Crohn's disease. There is mild upstream distention of the adjacent small bowel without evidence of enteric obstruction. No evidence of perforation or definable/drainable fluid collection. 2. Suspected mild bilateral symmetric sacroiliitis, potentially attributable to provided history of Crohn's disease.   Electronically Signed   By: Simonne Come M.D.   On: 11/24/2014 16:37   Ct Angio Abd/pel W/ And/or W/o  11/03/2014   CLINICAL DATA:  Severe left lower quadrant pain, and nausea. Patient with portal venous and cerebral venous thrombosis.  EXAM: CT ANGIOGRAPHY ABDOMEN AND PELVIS WITH CONTRAST AND WITHOUT CONTRAST  TECHNIQUE: Multidetector CT imaging of the abdomen and pelvis was performed using the standard protocol during bolus administration of intravenous contrast. Multiplanar reconstructed images including MIPs were obtained and reviewed to evaluate the  vascular anatomy.  CONTRAST:  100 cc Omnipaque 350.  COMPARISON:  CT of the abdomen pelvis dated 11/02/2014  FINDINGS: Arterial findings  Aorta  Normal in caliber, without filling defects or stenosis.  Celiac axis  Normal in caliber, without filling defects or stenosis.  Superior mesenteric  Normal in caliber, without filling defects or stenosis.  Left renal  Normal in caliber, without filling defects or stenosis. Accessory left renal artery is noted.  Right renal  Normal in caliber, without filling defects or stenosis.  Inferior mesenteric  Normal in caliber, without filling defects or stenosis.  Left iliac  Normal in caliber, without filling defects or stenosis.  Right iliac  Normal in caliber, without filling defects or stenosis.  Venous findings  No evidence of portal venous thrombosis.  Review of the MIP images confirms the above findings.  Nonvascular findings  There is mesenteric stranding and small amount of free fluid within the mid left abdomen. There is also a long segment of bowel thickening of the distal third of the ileum. The distal most ileum is noted to be distended and fluid-filled with maximum diameter of 2.7 cm. The terminal ileum is prominent. The appendix is normal. The remainder of the upper GI tract and colon are normal. Multiple sub  cm rounded mesenteric lymph nodes are seen within the same area of the abdomen. There is a tiny amount of free fluid, tracking along the left pericolic gutter. There is a small amount of free fluid within the pelvis as well.  Focal fatty infiltration along the falciform ligament of the liver is noted, otherwise the liver appears normal. The spleen, pancreas, bilateral adrenal glands, bilateral kidneys are normal. Vicarious excretion of contrast within the otherwise normal gallbladder is noted.  IMPRESSION: No evidence of a arterial or venous thrombosis.  Increased mesenteric stranding with interspersed shotty mesenteric lymph nodes within the mid left abdomen, in  the area of long segment of thickening of the distal ileum. Thickening of the terminal ileum and a focal distention of up to 2.7 cm of the distal ileum leading to it. These findings may represent infectious, inflammatory or ischemic bowel changes, with associated reactive lymphadenopathy. Alternatively mesenteric adenitis is of consideration.   Electronically Signed   By: Ted Mcalpine M.D.   On: 11/03/2014 11:42    Microbiology: No results found for this or any previous visit (from the past 240 hour(s)).   Labs: Basic Metabolic Panel:  Recent Labs Lab 11/24/14 0420 11/27/14 0945  NA 137 136  K 3.2* 3.9  CL 103 101  CO2 26 24  GLUCOSE 123* 94  BUN 10 11  CREATININE 0.86 0.68  CALCIUM 9.0 8.3*   Liver Function Tests:  Recent Labs Lab 11/24/14 0420 11/27/14 0945  AST 25 25  ALT 24 19  ALKPHOS 47 41  BILITOT 0.5 0.6  PROT 5.0* 4.5*  ALBUMIN 3.4* 2.9*   No results for input(s): LIPASE, AMYLASE in the last 168 hours. No results for input(s): AMMONIA in the last 168 hours. CBC:  Recent Labs Lab 11/24/14 0420 11/27/14 0945 11/28/14 0332  WBC 7.8 7.4 9.6  NEUTROABS  --  4.9  --   HGB 11.6* 11.6* 11.5*  HCT 36.3 37.5 35.4*  MCV 73.9* 74.9* 74.5*  PLT 249 250 240   Cardiac Enzymes: No results for input(s): CKTOTAL, CKMB, CKMBINDEX, TROPONINI in the last 168 hours. BNP: BNP (last 3 results) No results for input(s): BNP in the last 8760 hours.  ProBNP (last 3 results) No results for input(s): PROBNP in the last 8760 hours.  CBG: No results for input(s): GLUCAP in the last 168 hours.     SignedCalvert Cantor, MD [facility] 11/30/2014, 3:09 PM

## 2014-12-01 DIAGNOSIS — R1032 Left lower quadrant pain: Secondary | ICD-10-CM

## 2014-12-01 MED ORDER — PREDNISONE 10 MG PO TABS
10.0000 mg | ORAL_TABLET | Freq: Every day | ORAL | Status: DC
  Administered 2014-12-02 – 2014-12-04 (×3): 10 mg via ORAL
  Filled 2014-12-01 (×3): qty 1

## 2014-12-01 MED ORDER — MAGNESIUM HYDROXIDE 400 MG/5ML PO SUSP
30.0000 mL | Freq: Two times a day (BID) | ORAL | Status: AC | PRN
  Administered 2014-12-01 – 2014-12-02 (×2): 30 mL via ORAL
  Filled 2014-12-01 (×2): qty 30

## 2014-12-01 NOTE — Plan of Care (Signed)
Problem: Phase I Progression Outcomes Goal: Initial discharge plan identified Outcome: Completed/Met Date Met:  12/01/14 Pt to transfer to [facility]

## 2014-12-01 NOTE — Progress Notes (Addendum)
[facility] Progress Note   Victoria Alvarez  VPX:[id number]  DOB: 1990/06/22  DOA: 11/14/2014 PCP: Minerva Ends, MD  Brief narrative: Victoria Alvarez is a 24 y.o. 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   The patient presented to the ER on 9/5 for abdominal pain. This was associated with nausea and vomiting. She was found to have lactic acidosis. Recent hospitalization 7/30 through 8/7 with abdominal pain as well.   She was evaluated by GI and underwent a colonoscopy which revealed a distal ileal stricture and possibly inflammatory bowel disease. General surgery and hematology oncology are involved in the case as well. Fortunately her GI symptoms have not improved.   Subjective: Continues to have intermittent pain in the left lower part of her abdomen. No nausea no vomiting. No diarrhea. Has not had a bowel movement despite MiraLAX   Assessment/Plan: Abdominal pain/ileitis/stricture of the ileum -11/18/14 underwent colonoscopy - this revealed stricture in the distal ileum that appeared ulcerated and inflamed about 10-15 cm proximal to the ileocecal valve  - Biopsy shows chronic active ileitis. GI is following. She has been treated empirically for Crohn's with steroids but no improvement has been seen in symptoms of abdominal pain  -CTA performed to evaluate vasculature and mesenteric vessels-see report below -General surgery feels that resection of the strictured bowel may be necessary -this is clearly going to be complicated due to the fact that she is on anticoagulation and may have an underlying hypercoagulable state -Per surgical notes, recommendations are for her to be optimized for surgery with discontinuing prednisone  for 2 weeks and awaiting until she is 6 weeks out from her L portal vein thrombosis -The patient has requested a transfer to [facility] for another opinion-GI and surgery are in agreement with this plan  -Remains unclear whether she's had a vascular insult- GI (Dr Hilarie Fredrickson) suspects it may be related to the portal venous thrombosis and feels that this is likely not inflammatory bowel disease -GI recommends that we taper off prednisone  Portal venous thrombosis 10/07/14/History of CVA/ 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  -Hematology recommended lifelong anticoagulation- -etiology for suspected hypercoagulable state yet to be determined -Continue eliquis  Chronic pain syndrome/history of interstitial nephritis -Continue methadone  Seizure disorder --Continue Keppra 500 twice a day  Depression -Continue Prozac  Nicotine abuse -Continue nicotine patch  Morbid obesity    Code Status:     Code Status Orders        Start     Ordered   11/14/14 1430  Full code   Continuous     11/14/14 1429      Disposition Plan: Awaiting bed at [facility] DVT prophylaxis: Eliquis Consultants: GI, general surgery Procedures: Colonoscopy 9/7 and 9/9 Antibiotics: Anti-infectives    Start     Dose/Rate Route Frequency Ordered Stop   11/18/14 1400  metroNIDAZOLE (FLAGYL) tablet 500 mg  Status:  Discontinued     500 mg Oral 3 times per day 11/18/14 0939 11/22/14 1617   11/14/14 1530  metroNIDAZOLE (FLAGYL) IVPB 500 mg  Status:  Discontinued     500 mg 100 mL/hr over 60 Minutes Intravenous Every 8 hours 11/14/14 1429 11/18/14 0939      Objective: Danley Danker  Weights   11/14/14 0715 11/14/14 1432  Weight: 91 kg (200 lb 9.9 oz) 91.173 kg (201 lb)   No intake or output data in the 24 hours ending 12/01/14 1537   Vitals Filed Vitals:   11/30/14 1444 11/30/14 2212 12/01/14 0513 12/01/14 1348  BP: 112/63 120/62  113/69 122/56  Pulse: 86 56 70 92  Temp: 99.1 F (37.3 C) 98 F (36.7 C) 98 F (36.7 C) 98.6 F (37 C)  TempSrc: Oral Oral Oral Oral  Resp: 18 18 16 16   Height:      Weight:      SpO2: 98% 100% 97% 95%    Exam:  General:  Pt is alert, not in acute distress  HEENT: No icterus, No thrush, oral mucosa moist  Cardiovascular: regular rate and rhythm, S1/S2 No murmur  Respiratory: clear to auscultation bilaterally   Abdomen: Soft, +Bowel sounds,  tender in right lower quadrant, non distended, no guarding  MSK: No LE edema, cyanosis or clubbing  Data Reviewed: Basic Metabolic Panel:  Recent Labs Lab 11/27/14 0945  NA 136  K 3.9  CL 101  CO2 24  GLUCOSE 94  BUN 11  CREATININE 0.68  CALCIUM 8.3*   Liver Function Tests:  Recent Labs Lab 11/27/14 0945  AST 25  ALT 19  ALKPHOS 41  BILITOT 0.6  PROT 4.5*  ALBUMIN 2.9*   No results for input(s): LIPASE, AMYLASE in the last 168 hours. No results for input(s): AMMONIA in the last 168 hours. CBC:  Recent Labs Lab 11/27/14 0945 11/28/14 0332  WBC 7.4 9.6  NEUTROABS 4.9  --   HGB 11.6* 11.5*  HCT 37.5 35.4*  MCV 74.9* 74.5*  PLT 250 240   Cardiac Enzymes: No results for input(s): CKTOTAL, CKMB, CKMBINDEX, TROPONINI in the last 168 hours. BNP (last 3 results) No results for input(s): BNP in the last 8760 hours.  ProBNP (last 3 results) No results for input(s): PROBNP in the last 8760 hours.  CBG: No results for input(s): GLUCAP in the last 168 hours.  No results found for this or any previous visit (from the past 240 hour(s)).   Studies: No results found.  Scheduled Meds:  Scheduled Meds: . apixaban  5 mg Oral BID  . FLUoxetine  20 mg Oral Daily  . levETIRAcetam  500 mg Oral BID  . methadone  85 mg Oral Daily  . nicotine  21 mg Transdermal Daily  . pantoprazole  40 mg Oral Q0600  . polyethylene glycol  17 g Oral Daily  . [START ON 12/02/2014] predniSONE  10 mg Oral Q breakfast   Continuous  Infusions:   Time spent on care of this patient: 35 min   [place], MD 12/01/2014, 3:37 PM  LOS: 17 days   [facility] Office  [phone] Pager - Text Page per [email or web address] If 7PM-7AM, please contact night-coverage [email or web address]

## 2014-12-02 LAB — BASIC METABOLIC PANEL WITH GFR
Anion gap: 8 (ref 5–15)
BUN: 10 mg/dL (ref 6–20)
CO2: 30 mmol/L (ref 22–32)
Calcium: 8.7 mg/dL — ABNORMAL LOW (ref 8.9–10.3)
Chloride: 98 mmol/L — ABNORMAL LOW (ref 101–111)
Creatinine, Ser: 0.83 mg/dL (ref 0.44–1.00)
GFR calc Af Amer: >60 mL/min
GFR calc non Af Amer: >60 mL/min
Glucose, Bld: 88 mg/dL (ref 65–99)
Potassium: 3.6 mmol/L (ref 3.5–5.1)
Sodium: 136 mmol/L (ref 135–145)

## 2014-12-02 LAB — CBC
HCT: 36.3 % (ref 36.0–46.0)
Hemoglobin: 11.6 g/dL — ABNORMAL LOW (ref 12.0–15.0)
MCH: 24.5 pg — ABNORMAL LOW (ref 26.0–34.0)
MCHC: 32 g/dL (ref 30.0–36.0)
MCV: 76.6 fL — ABNORMAL LOW (ref 78.0–100.0)
Platelets: 254 10*3/uL (ref 150–400)
RBC: 4.74 MIL/uL (ref 3.87–5.11)
RDW: 25.9 % — ABNORMAL HIGH (ref 11.5–15.5)
WBC: 7 10*3/uL (ref 4.0–10.5)

## 2014-12-02 MED ORDER — BISACODYL 10 MG RE SUPP
10.0000 mg | Freq: Once | RECTAL | Status: AC
  Administered 2014-12-02: 10 mg via RECTAL
  Filled 2014-12-02: qty 1

## 2014-12-03 ENCOUNTER — Inpatient Hospital Stay (HOSPITAL_COMMUNITY): Payer: Medicaid Other

## 2014-12-03 MED ORDER — PEG 3350-KCL-NA BICARB-NACL 420 G PO SOLR
4000.0000 mL | Freq: Once | ORAL | Status: AC
  Administered 2014-12-03: 4000 mL via ORAL
  Filled 2014-12-03: qty 4000

## 2014-12-03 NOTE — Progress Notes (Signed)
Given soapsuds enema , tolerated 500 cc .  Pt claimed she had a good result so far.

## 2014-12-03 NOTE — Progress Notes (Signed)
[facility] Progress Note   Victoria Alvarez  [id number]:[id number]  DOB: 1990/07/02  DOA: 11/14/2014 PCP: Minerva Ends, MD  Brief narrative: Victoria Alvarez is a 24 y.o. 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   The patient presented to the ER on 9/5 for abdominal pain. This was associated with nausea and vomiting. She was found to have lactic acidosis. Recent hospitalization 7/30 through 8/7 with abdominal pain as well.   She was evaluated by GI and underwent a colonoscopy which revealed a distal ileal stricture and possibly inflammatory bowel disease. General surgery and hematology oncology are involved in the case as well. Fortunately her GI symptoms have not improved.   Subjective: No BMs. Feels abdomen is distended, having cramping. No nausea.   Assessment/Plan: Abdominal pain/ileitis/stricture of the ileum -11/18/14 underwent colonoscopy - this revealed stricture in the distal ileum that appeared ulcerated and inflamed about 10-15 cm proximal to the ileocecal valve  - Biopsy shows chronic active ileitis. GI is following. She has been treated empirically for Crohn's with steroids but no improvement has been seen in symptoms of abdominal pain  -CTA performed to evaluate vasculature and mesenteric vessels-see report below -General surgery feels that resection of the strictured bowel may be necessary -this is clearly going to be complicated due to the fact that she is on anticoagulation and may have an underlying hypercoagulable state -Per surgical notes, recommendations are for her to be optimized for surgery with discontinuing prednisone for 2 weeks and awaiting until she is 6 weeks out from her L portal vein thrombosis -The  patient has requested a transfer to [facility] for another opinion-GI and surgery are in agreement with this plan  -Remains unclear whether she's had a vascular insult- GI (Dr Hilarie Fredrickson) suspects it may be related to the portal venous thrombosis and feels that this is likely not inflammatory bowel disease -GI recommends that we taper off prednisone  Severe constipation - will try soap suds enema- if no results, start GoLytely   Portal venous thrombosis 10/07/14/History of CVA/ 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  -Hematology recommended lifelong anticoagulation- -etiology for suspected hypercoagulable state yet to be determined -Continue eliquis  Chronic pain syndrome/history of interstitial nephritis -Continue methadone  Seizure disorder --Continue Keppra 500 twice a day  Depression -Continue Prozac  Nicotine abuse -Continue nicotine patch  Morbid obesity    Code Status:     Code Status Orders        Start     Ordered   11/14/14 1430  Full code   Continuous     11/14/14 1429      Disposition Plan: Awaiting bed at [facility] DVT prophylaxis: Eliquis Consultants: GI, general surgery Procedures: Colonoscopy 9/7 and 9/9 Antibiotics: Anti-infectives    Start     Dose/Rate Route Frequency Ordered Stop   11/18/14 1400  metroNIDAZOLE (FLAGYL) tablet 500 mg  Status:  Discontinued     500 mg Oral 3 times per day 11/18/14 0939 11/22/14 1617   11/14/14 1530  metroNIDAZOLE (FLAGYL) IVPB 500 mg  Status:  Discontinued     500 mg 100 mL/hr over 60 Minutes Intravenous Every 8 hours 11/14/14 1429 11/18/14 4742      Objective: [employer]  11/14/14 0715 11/14/14 1432  Weight: 91 kg (200 lb 9.9 oz) 91.173 kg (201 lb)    Intake/Output Summary (Last 24 hours) at 12/03/14 1644 Last data filed at 12/03/14 1317  Gross per 24 hour  Intake   1968 ml  Output      0 ml  Net   1968 ml      Vitals Filed Vitals:   12/02/14 1333 12/02/14 2118 12/03/14 0444 12/03/14 1317  BP: 134/69 106/39 103/49 122/66  Pulse: 85 66 82 79  Temp: 98.7 F (37.1 C) 97.8 F (36.6 C) 98.2 F (36.8 C) 98.8 F (37.1 C)  TempSrc: Axillary Oral Oral Oral  Resp: 16 16 16 16   Height:      Weight:      SpO2: 99% 97% 98% 99%    Exam:  General:  Pt is alert, not in acute distress  HEENT: No icterus, No thrush, oral mucosa moist  Cardiovascular: regular rate and rhythm, S1/S2 No murmur  Respiratory: clear to auscultation bilaterally   Abdomen: Soft, +Bowel sounds,  tender dfifusely , non distended, no guarding  MSK: No LE edema, cyanosis or clubbing  Data Reviewed: Basic Metabolic Panel:  Recent Labs Lab 11/27/14 0945 12/02/14 0346  NA 136 136  K 3.9 3.6  CL 101 98*  CO2 24 30  GLUCOSE 94 88  BUN 11 10  CREATININE 0.68 0.83  CALCIUM 8.3* 8.7*   Liver Function Tests:  Recent Labs Lab 11/27/14 0945  AST 25  ALT 19  ALKPHOS 41  BILITOT 0.6  PROT 4.5*  ALBUMIN 2.9*   No results for input(s): LIPASE, AMYLASE in the last 168 hours. No results for input(s): AMMONIA in the last 168 hours. CBC:  Recent Labs Lab 11/27/14 0945 11/28/14 0332 12/02/14 0346  WBC 7.4 9.6 7.0  NEUTROABS 4.9  --   --   HGB 11.6* 11.5* 11.6*  HCT 37.5 35.4* 36.3  MCV 74.9* 74.5* 76.6*  PLT 250 240 254   Cardiac Enzymes: No results for input(s): CKTOTAL, CKMB, CKMBINDEX, TROPONINI in the last 168 hours. BNP (last 3 results) No results for input(s): BNP in the last 8760 hours.  ProBNP (last 3 results) No results for input(s): PROBNP in the last 8760 hours.  CBG: No results for input(s): GLUCAP in the last 168 hours.  No results found for this or any previous visit (from the past 240 hour(s)).   Studies: Dg Abd 2 Views  12/03/2014   CLINICAL DATA:  Abdominal distention, lower abdominal pain for 2 weeks.  EXAM: ABDOMEN - 2 VIEW  COMPARISON:  CT 11/24/2014  FINDINGS: Large stool  burden throughout the colon. There is normal bowel gas pattern. No free air. No organomegaly or suspicious calcification. No acute bony abnormality.  IMPRESSION: Large stool burden.  No acute findings.   Electronically Signed   By: Rolm Baptise M.D.   On: 12/03/2014 13:02    Scheduled Meds:  Scheduled Meds: . apixaban  5 mg Oral BID  . FLUoxetine  20 mg Oral Daily  . levETIRAcetam  500 mg Oral BID  . methadone  85 mg Oral Daily  . nicotine  21 mg Transdermal Daily  . pantoprazole  40 mg Oral Q0600  . polyethylene glycol  17 g Oral Daily  . predniSONE  10 mg Oral Q breakfast   Continuous Infusions:   Time spent on care of this patient: 35 min   [place], MD 12/03/2014, 4:44 PM  LOS: 19 days   [facility] Office  [phone] Pager - Text Page per [email or web address] If 7PM-7AM, please contact night-coverage [email or web address]

## 2014-12-04 MED ORDER — POLYETHYLENE GLYCOL 3350 17 G PO PACK: 17.0000 g | PACK | Freq: Every day | ORAL | Status: DC | PRN

## 2014-12-04 MED ORDER — DOCUSATE SODIUM 100 MG PO CAPS
200.0000 mg | ORAL_CAPSULE | Freq: Two times a day (BID) | ORAL | Status: DC
  Administered 2014-12-04 – 2014-12-05 (×3): 200 mg via ORAL
  Filled 2014-12-04 (×3): qty 2

## 2014-12-04 NOTE — Progress Notes (Signed)
[facility] Progress Note   Victoria Alvarez  ZOX:[id number]  DOB: 07-29-1990  DOA: 11/14/2014 PCP: Lora Paula, MD  Brief narrative: Victoria Alvarez is a 24 y.o. 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   The patient presented to the ER on 9/5 for abdominal pain. This was associated with nausea and vomiting. She was found to have lactic acidosis. Recent hospitalization 7/30 through 8/7 with abdominal pain as well.   She was evaluated by GI and underwent a colonoscopy which revealed a distal ileal stricture and possibly inflammatory bowel disease. General surgery and hematology oncology are involved in the case as well. Fortunately her GI symptoms have not improved.   Subjective: Numerous BMs through the night. Abdominal pain continues.   Assessment/Plan: Abdominal pain/ileitis/stricture of the ileum -11/18/14 underwent colonoscopy - this revealed stricture in the distal ileum that appeared ulcerated and inflamed about 10-15 cm proximal to the ileocecal valve  - Biopsy shows chronic active ileitis. GI is following. She has been treated empirically for Crohn's with steroids but no improvement has been seen in symptoms of abdominal pain  -CTA performed to evaluate vasculature and mesenteric vessels-see report below -General surgery feels that resection of the strictured bowel may be necessary -this is clearly going to be complicated due to the fact that she is on anticoagulation and may have an underlying hypercoagulable state -Per surgical notes, recommendations are for her to be optimized for surgery with discontinuing prednisone for 2 weeks and awaiting until she is 6 weeks out from her L portal vein thrombosis -The patient  has requested a transfer to [facility] for another opinion-GI and surgery are in agreement with this plan  -Remains unclear whether she's had a vascular insult- GI (Dr Rhea Belton) suspects it may be related to the portal venous thrombosis and feels that this is likely not inflammatory bowel disease -GI recommends that we taper off prednisone  Severe constipation - resolved with soap suds enema and NuLytely - start Colace BID  - cont Miralax   Portal venous thrombosis 10/07/14/History of CVA/ 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  -Hematology recommended lifelong anticoagulation- -etiology for suspected hypercoagulable state yet to be determined -Continue eliquis  Chronic pain syndrome/history of interstitial nephritis -Continue methadone  Seizure disorder --Continue Keppra 500 twice a day  Depression -Continue Prozac  Nicotine abuse -Continue nicotine patch  Morbid obesity    Code Status:     Code Status Orders        Start     Ordered   11/14/14 1430  Full code   Continuous     11/14/14 1429      Disposition Plan: Awaiting bed at [facility] DVT prophylaxis: Eliquis Consultants: GI, general surgery Procedures: Colonoscopy 9/7 and 9/9 Antibiotics: Anti-infectives    Start     Dose/Rate Route Frequency Ordered Stop   11/18/14 1400  metroNIDAZOLE (FLAGYL) tablet 500 mg  Status:  Discontinued     500 mg Oral 3 times per day 11/18/14 0939 11/22/14 1617   11/14/14 1530  metroNIDAZOLE (FLAGYL) IVPB 500 mg  Status:  Discontinued     500 mg 100 mL/hr over 60 Minutes Intravenous Every 8 hours 11/14/14 1429 11/18/14 0939      Objective:  Filed Weights   11/14/14 0715 11/14/14 1432  Weight: 91 kg (200 lb 9.9 oz) 91.173 kg (201 lb)    Intake/Output Summary (Last 24 hours) at 12/04/14 1304 Last data filed at 12/04/14 1010  Gross per 24 hour  Intake    902 ml  Output    650 ml  Net     252 ml     Vitals Filed Vitals:   12/03/14 0444 12/03/14 1317 12/03/14 2220 12/04/14 0457  BP: 103/49 122/66 124/71 116/56  Pulse: 82 79 88 86  Temp: 98.2 F (36.8 C) 98.8 F (37.1 C) 98.9 F (37.2 C) 98.5 F (36.9 C)  TempSrc: Oral Oral Oral Oral  Resp: 16 16 16 16   Height:      Weight:      SpO2: 98% 99% 100% 100%    Exam:  General:  Pt is alert, not in acute distress  HEENT: No icterus, No thrush, oral mucosa moist  Cardiovascular: regular rate and rhythm, S1/S2 No murmur  Respiratory: clear to auscultation bilaterally   Abdomen: Soft, +Bowel sounds,  tender dfifusely , non distended, no guarding  MSK: No LE edema, cyanosis or clubbing  Data Reviewed: Basic Metabolic Panel:  Recent Labs Lab 12/02/14 0346  NA 136  K 3.6  CL 98*  CO2 30  GLUCOSE 88  BUN 10  CREATININE 0.83  CALCIUM 8.7*   Liver Function Tests: No results for input(s): AST, ALT, ALKPHOS, BILITOT, PROT, ALBUMIN in the last 168 hours. No results for input(s): LIPASE, AMYLASE in the last 168 hours. No results for input(s): AMMONIA in the last 168 hours. CBC:  Recent Labs Lab 11/28/14 0332 12/02/14 0346  WBC 9.6 7.0  HGB 11.5* 11.6*  HCT 35.4* 36.3  MCV 74.5* 76.6*  PLT 240 254   Cardiac Enzymes: No results for input(s): CKTOTAL, CKMB, CKMBINDEX, TROPONINI in the last 168 hours. BNP (last 3 results) No results for input(s): BNP in the last 8760 hours.  ProBNP (last 3 results) No results for input(s): PROBNP in the last 8760 hours.  CBG: No results for input(s): GLUCAP in the last 168 hours.  No results found for this or any previous visit (from the past 240 hour(s)).   Studies: Dg Abd 2 Views  12/03/2014   CLINICAL DATA:  Abdominal distention, lower abdominal pain for 2 weeks.  EXAM: ABDOMEN - 2 VIEW  COMPARISON:  CT 11/24/2014  FINDINGS: Large stool burden throughout the colon. There is normal bowel gas pattern. No free air. No organomegaly or suspicious calcification. No  acute bony abnormality.  IMPRESSION: Large stool burden.  No acute findings.   Electronically Signed   By: Charlett Nose M.D.   On: 12/03/2014 13:02    Scheduled Meds:  Scheduled Meds: . apixaban  5 mg Oral BID  . docusate sodium  200 mg Oral BID  . FLUoxetine  20 mg Oral Daily  . levETIRAcetam  500 mg Oral BID  . methadone  85 mg Oral Daily  . nicotine  21 mg Transdermal Daily  . pantoprazole  40 mg Oral Q0600  . predniSONE  10 mg Oral Q breakfast   Continuous Infusions:   Time spent on care of this patient: 35 min   RIZWAN,SAIMA, MD 12/04/2014, 1:04 PM  LOS: 20 days   [facility] Office  [phone] Pager - Text Page per [email or web address] If 7PM-7AM, please contact night-coverage [email or web address]

## 2014-12-04 NOTE — Progress Notes (Signed)
Prn percocet and zofran administered twice for c/o pain and nausea

## 2014-12-05 MED ORDER — OXYCODONE-ACETAMINOPHEN 5-325 MG PO TABS: 2.0000 | ORAL_TABLET | ORAL | Status: DC | PRN

## 2014-12-05 MED ORDER — POLYETHYLENE GLYCOL 3350 17 G PO PACK: 17.0000 g | PACK | Freq: Every day | ORAL | Status: DC | PRN

## 2014-12-05 MED ORDER — DOCUSATE SODIUM 100 MG PO CAPS: 200.0000 mg | ORAL_CAPSULE | Freq: Two times a day (BID) | ORAL | Status: DC

## 2014-12-05 MED ORDER — ONDANSETRON 4 MG PO TBDP: 4.0000 mg | ORAL_TABLET | Freq: Three times a day (TID) | ORAL | Status: DC | PRN

## 2014-12-05 NOTE — Discharge Summary (Addendum)
Physician Discharge Summary  Victoria Alvarez QMV:[id number] DOB: March 20, 1990 DOA: 11/14/2014  PCP: Minerva Ends, MD  Admit date: 11/14/2014 Discharge date: 12/05/2014  Time spent: 60 minutes  Recommendations for Outpatient Follow-up:  1. Outpatient follow-up with Gen. surgery in 2 weeks  Discharge Condition: Table Diet recommendation: Low fiber soft diet-she is advised if she has increased pain on this diet, she should transition to full liquids only  Discharge Diagnoses:  Principal Problem:   Lactic acidosis Active Problems:   Ileitis   Tobacco abuse   Seizure   Methadone maintenance therapy patient   Portal vein thrombosis   Cerebral venous sinus thrombosis   History of present illness:  Victoria Alvarez is a 24 y.o. 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  As mentioned, she was recently admitted on 7/29 through 8/7 for left portal venous sinus thrombosis and recommended to continue eliquis.  The patient presented to the ER on 9/5 for abdominal pain. This was associated with nausea, vomiting and lactic acidosis. CT of the abdomen and pelvis performed in the ER revealed enteritis involving the distal ileum. Subsequently a GI consult was requested. She underwent a colonoscopy which revealed a distal ileal stricture and possibly inflammatory bowel disease - biopsies were performed and general surgery was consulted to weigh in on whether resection was needed.  Hospital Course:  Abdominal pain/ileitis/stricture of the ileum -11/18/14 underwent colonoscopy - this revealed stricture in the distal ileum that appeared ulcerated and inflamed about 10-15 cm proximal to the ileocecal valve  - Biopsy shows chronic active  ileitis. GI  has been assisting with management. She has been treated empirically for Crohn's with steroids but no improvement has been seen in abdominal pain  ---CTA performed to evaluate vasculature and mesenteric vessels-see report below - I have spoken with GI - Dr Hilarie Fredrickson suspects that she may have had a vascular insult related to the recent portal venous thrombosis and feels that this is likely not inflammatory bowel disease-- during the hospital stay, she was given Solu-Medrol and then transitioned to prednisone-as mentioned, steroids have been ineffective in controlling her pain and GI  has recommended that I taper her off prednisone -General surgery feels that resection of the strictured bowel is necessary -this is clearly going to be complicated due to the fact that she is on anticoagulation and has an underlying hypercoagulable state -Per my discussion with surgery, recommendations are for her to be optimized for surgery with discontinuing prednisone for 2 weeks and awaiting until she is 6 weeks out from her L portal vein thrombosis - Patient continues to have ongoing right lower quadrant pain and continues to require narcotics in addition to her maintenance methadone. - She has been tolerating a soft diet without vomiting.  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 Jana Hakim) has recommended lifelong anticoagulation- -etiology for suspected hypercoagulable state yet to be determined -Continue eliquis -Commended follow-up with Dr. Jana Hakim to further determine if she has an underlying hypercoagulable state  Chronic pain syndrome/history of interstitial nephritis -  Continue  methadone  Seizure disorder --Continue Keppra 500 twice a day  Depression -Continue Prozac  Nicotine abuse -Counseled to discontinue  Morbid obesity  Consultants: GI, general surgery, hematology Procedures: Colonoscopy 9/7 and 9/9  Discharge Exam: Filed Weights   11/14/14 0715 11/14/14 1432  Weight: 91 kg (200 lb 9.9 oz) 91.173 kg (201 lb)   Filed Vitals:   12/05/14 0455  BP: 112/70  Pulse: 60  Temp: 98.1 F (36.7 C)  Resp: 16    General: AAO x 3, no distress Cardiovascular: RRR, no murmurs  Respiratory: clear to auscultation bilaterally GI: soft, tender in right lower quadrant, non-distended, bowel sound positive  Discharge Instructions You were cared for by a hospitalist during your hospital stay. If you have any questions about your discharge medications or the care you received while you were in the hospital after you are discharged, you can call the unit and asked to speak with the hospitalist on call if the hospitalist that took care of you is not available. Once you are discharged, your primary care physician will handle any further medical issues. Please note that NO REFILLS for any discharge medications will be authorized once you are discharged, as it is imperative that you return to your primary care physician (or establish a relationship with a primary care physician if you do not have one) for your aftercare needs so that they can reassess your need for medications and monitor your lab values.      Discharge Instructions    Discharge instructions    Complete by:  As directed   Full liquid/ low fiber diet     Increase activity slowly    Complete by:  As directed             Medication List    TAKE these medications        apixaban 5 MG Tabs tablet  Commonly known as:  ELIQUIS  Take 1 tablet (5 mg total) by mouth 2 (two) times daily.     docusate sodium 100 MG capsule  Commonly known as:  COLACE  Take 2 capsules (200 mg total) by mouth 2 (two)  times daily.     EPINEPHrine 0.3 mg/0.3 mL Soaj injection  Commonly known as:  EPI-PEN  Inject 0.3 mLs (0.3 mg total) into the muscle once.     FLUoxetine 20 MG capsule  Commonly known as:  PROZAC  Take 1 capsule (20 mg total) by mouth daily.     levETIRAcetam 500 MG tablet  Commonly known as:  KEPPRA  Take 1 tablet (500 mg total) by mouth 2 (two) times daily.     Linaclotide 145 MCG Caps capsule  Commonly known as:  LINZESS  Take 1 capsule (145 mcg total) by mouth daily.     methadone 5 MG tablet  Commonly known as:  DOLOPHINE  Take 17 tablets (85 mg total) by mouth daily. Home medication continued     nicotine 21 mg/24hr patch  Commonly known as:  NICODERM CQ - dosed in mg/24 hours  Place 1 patch (21 mg total) onto the skin daily.     ondansetron 4 MG disintegrating tablet  Commonly known as:  ZOFRAN ODT  Take 1 tablet (4 mg total) by mouth every 8 (eight) hours as needed for nausea or vomiting.     oxyCODONE-acetaminophen 5-325 MG per tablet  Commonly known as:  PERCOCET/ROXICET  Take 2 tablets by mouth every 4 (four) hours as needed for moderate pain.  polyethylene glycol packet  Commonly known as:  MIRALAX / GLYCOLAX  Take 17 g by mouth daily.       Allergies  Allergen Reactions  . Ativan [Lorazepam] Other (See Comments)    " I don't remember anything."  . Bee Venom Swelling  . Ciprofloxacin Diarrhea and Nausea And Vomiting   Follow-up Information    Follow up with Gayland Curry, MD.   Specialty:  General Surgery   Why:  call to schedule appt for 2 wks from now   Contact information:   [place]  [id number] [phone]       Follow up with Scarlette Shorts, MD.   Specialty:  Gastroenterology   Why:  GI , As needed   Contact information:   [address] [place] [address] [phone]        The results of significant diagnostics from this hospitalization (including imaging, microbiology, ancillary and laboratory)  are listed below for reference.    Significant Diagnostic Studies: Dg Abd 1 View  11/15/2014   CLINICAL DATA:  Lower abdominal pain  EXAM: ABDOMEN - 1 VIEW  COMPARISON:  Abdominal radiographs October 14, 2014; CT abdomen and pelvis November 14, 2014  FINDINGS: There is moderate stool in the colon. There is no bowel dilatation or air-fluid level suggesting obstruction. No free air is seen on this supine examination. There are surgical clips in the left pelvic region.  IMPRESSION: Bowel gas pattern overall unremarkable.   Electronically Signed   By: Lowella Grip III M.D.   On: 11/15/2014 12:45   Ct Abdomen Pelvis W Contrast  11/14/2014   CLINICAL DATA:  25 year old female with acute abdominal and pelvic pain with vomiting for 1 day.  EXAM: CT ABDOMEN AND PELVIS WITH CONTRAST  TECHNIQUE: Multidetector CT imaging of the abdomen and pelvis was performed using the standard protocol following bolus administration of intravenous contrast.  CONTRAST:  131m OMNIPAQUE IOHEXOL 300 MG/ML  SOLN  COMPARISON:  11/03/2014 and prior CTs  FINDINGS: Lower chest:  Minimal basilar atelectasis noted.  Hepatobiliary: No significant hepatic or gallbladder abnormalities. There is no evidence of biliary dilatation.  Pancreas: Unremarkable  Spleen: Unremarkable  Adrenals/Urinary Tract: Kidneys, adrenal glands and bladder are unremarkable.  Stomach/Bowel: There is circumferential wall thickening of a moderate length segment distal ileum with adjacent inflammation and small amount of interloop fluid. There is mild distention of small bowel loops just proximal to the moderate segment wall thickening. There is no evidence of abscess or pneumoperitoneum. The appendix is normal.  Vascular/Lymphatic: Prominent mesenteric lymph nodes are likely reactive. There is no evidence of vascular abnormality.  Reproductive: Uterus and adnexal regions are unremarkable.  Other: A trace amount of free pelvic fluid is noted.  Musculoskeletal: No acute or  suspicious abnormalities identified.  IMPRESSION: Enteritis involving the distal ileum with adjacent inflammation and small amount of fluid. This likely is inflammatory, infectious or Crohn's disease. No evidence of pneumoperitoneum or abscess.   Electronically Signed   By: JMargarette CanadaM.D.   On: 11/14/2014 10:28   Dg Abd 2 Views  12/03/2014   CLINICAL DATA:  Abdominal distention, lower abdominal pain for 2 weeks.  EXAM: ABDOMEN - 2 VIEW  COMPARISON:  CT 11/24/2014  FINDINGS: Large stool burden throughout the colon. There is normal bowel gas pattern. No free air. No organomegaly or suspicious calcification. No acute bony abnormality.  IMPRESSION: Large stool burden.  No acute findings.   Electronically Signed   By:  Rolm Baptise M.D.   On: 12/03/2014 13:02   Dg Abd Portable 1v  11/22/2014   CLINICAL DATA:  Right lower quadrant pain with associated mild generalized abdominal pain.  EXAM: PORTABLE ABDOMEN - 1 VIEW  COMPARISON:  11/17/2014  FINDINGS: The bowel gas pattern is nonobstructive. There is a long tubular radiolucent structure which seen to arise from the cecum, with associated surrounding soft tissue thickening.  No radio-opaque calculi or other significant radiographic abnormality are seen. Moderate amount of stool throughout the colon. No radiographic evidence of organomegaly. Osseous structures are grossly normal.  IMPRESSION: Nonobstructive bowel gas pattern with evidence of constipation.  Long tubular structure which seems to arise from wall are overlie the distal cecum. This may represent a gas containing thickened distal ileum, or abnormal appendix. In correlation to CT of the abdomen dated 11/14/2014, there is no evidence of appendicitis, and the distal ileum appears inflamed. Therefore, this radiographic finding likely correlates to the segment of abnormal ileum. Radiographic or CT follow-up is recommended.  These results will be called to the ordering clinician or representative by the  Radiologist Assistant, and communication documented in the PACS or zVision Dashboard.   Electronically Signed   By: Fidela Salisbury M.D.   On: 11/22/2014 12:15   Ct Angio Abd/pel W/ And/or W/o  11/24/2014   CLINICAL DATA:  History of Crohn's disease. Evaluate for portal vein thrombosis.  EXAM: CT ANGIOGRAPHY ABDOMEN AND PELVIS WITH CONTRAST AND WITHOUT CONTRAST  TECHNIQUE: Multidetector CT imaging of the abdomen and pelvis was performed using the standard protocol during bolus administration of intravenous contrast. Multiplanar reconstructed images including MIPs were obtained and reviewed to evaluate the vascular anatomy.  CONTRAST:  13m OMNIPAQUE IOHEXOL 350 MG/ML SOLN  COMPARISON:  CT abdomen pelvis - 11/14/2014  FINDINGS: Vascular Findings:  Abdominal aorta: There is no atherosclerotic plaque within a normal caliber abdominal aorta. No abdominal aortic dissection No aortic wall thickening or periaortic stranding.  Celiac artery: Widely patent.  Conventional branching pattern.  SMA: Widely patent. 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 The distal tributaries of the SMA are widely patent without discrete intraluminal filling defect to suggest distal embolism.  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 The dominant cranial right-sided renal artery is widely patent without a hemodynamically significant stenosis. No vessel irregularity to suggest FMD.  Left Renal artery: Duplicated; there is an accessory left renal artery which supplies the inferior pole of the left kidney. The dominant left-sided renal artery is widely patent without a hemodynamically significant stenosis. No vessel irregularity to suggest FMD.  IMA: Widely patent without hemodynamically significant stenosis.  Pelvic vasculature: The  bilateral common, external and internal iliac arteries are of normal caliber and widely patent without hemodynamically significant stenosis.  Venous Findings: The portal vein is widely patent. The IVC and bilateral pelvic venous systems are widely patent.  Review of the MIP images confirms the above findings.   --------------------------------------------------------------------------------  Nonvascular Findings:  Normal hepatic contour. There is a minimal amount of focal fatty infiltration adjacent to the fissure for ligamentum teres. No discrete hepatic lesions. Normal appearance of the gallbladder. No intra extrahepatic biliary duct dilatation. No ascites.  There is symmetric enhancement and excretion of the bilateral kidneys. No definite renal stones in this postcontrast examination. No discrete renal lesions.  No urinary obstruction or perinephric stranding. Normal appearance of the bilateral adrenal glands, pancreas and spleen.  Note is again made of short segment bowel wall thickening involving an approximately 20 cm segment of the distal ileum though sparing the terminal ileum (representative images 57 through 62, series 11). This finding is associated with mild upstream distension of the adjacent distal ileum (representative image 42, series 11) but not resulting in enteric obstruction. The amount of previously noted adjacent mesenteric stranding and reactive adenopathy within the adjacent mesenteries has decreased in the interval. No evidence of perforation or definable/drainable fluid collection. No pneumoperitoneum, pneumatosis or portal venous gas.  Large colonic stool burden. Normal appearance of the retrocecal appendix.  No bulky retroperitoneal, mesenteric, pelvic or inguinal lymphadenopathy.  Normal appearance of the pelvic organs. No discrete adnexal lesion. Normal appearance of the urinary bladder given underdistention. No free fluid in the pelvic cul-de-sac.  Limited visualization of lower thorax  demonstrates minimal subsegmental atelectasis within the image caudal aspect the left lower lobe. No focal airspace opacities. No pleural effusion. Normal heart size. No pericardial effusion.  No acute or aggressive osseous abnormalities. Note is made of mild bilateral symmetric sacroiliitis potentially attributable to provided history of Crohn's disease.  Tiny mesenteric fat containing periumbilical hernia. There is a minimal amount of soft tissue stranding about the midline of the low back.  IMPRESSION: Vascular Impression:  1. Unremarkable CTA of the abdomen and pelvis. No evidence of mesenteric ischemia or portal vein thrombosis. 2. 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 Nonvascular Impression:  1. Minimally improved bowel wall thickening involving an approximately 20 cm segment of the distal ileum with sparing of the TI and cecum, findings compatible with provided history of Crohn's disease. There is mild upstream distention of the adjacent small bowel without evidence of enteric obstruction. No evidence of perforation or definable/drainable fluid collection. 2. Suspected mild bilateral symmetric sacroiliitis, potentially attributable to provided history of Crohn's disease.   Electronically Signed   By: Sandi Mariscal M.D.   On: 11/24/2014 16:37    Microbiology: No results found for this or any previous visit (from the past 240 hour(s)).   Labs: Basic Metabolic Panel:  Recent Labs Lab 12/02/14 0346  NA 136  K 3.6  CL 98*  CO2 30  GLUCOSE 88  BUN 10  CREATININE 0.83  CALCIUM 8.7*   Liver Function Tests: No results for input(s): AST, ALT, ALKPHOS, BILITOT, PROT, ALBUMIN in the last 168 hours. No results for input(s): LIPASE, AMYLASE in the last 168 hours. No results for input(s): AMMONIA in the last 168 hours. CBC:  Recent Labs Lab 12/02/14 0346  WBC  7.0  HGB 11.6*  HCT 36.3  MCV 76.6*  PLT 254   Cardiac Enzymes: No results for input(s): CKTOTAL, CKMB, CKMBINDEX, TROPONINI in the last 168 hours. BNP: BNP (last 3 results) No results for input(s): BNP in the last 8760 hours.  ProBNP (last 3 results) No results for input(s): PROBNP in the last 8760 hours.  CBG: No results for input(s): GLUCAP in the last 168 hours.     SignedDebbe Odea, MD [facility] 12/05/2014, 11:02 AM

## 2014-12-05 NOTE — Progress Notes (Signed)
Victoria Alvarez to be D/C'd Home per MD order.  Discussed with the patient and all questions fully answered.  VSS, Skin clean, dry and intact without evidence of skin break down, no evidence of skin tears noted. IV catheter discontinued intact. Site without signs and symptoms of complications. Dressing and pressure applied.  An After Visit Summary was printed and given to the patient. Patient received prescriptions.  D/c education completed with patient/family including follow up instructions, medication list, d/c activities limitations if indicated, with other d/c instructions as indicated by MD - patient able to verbalize understanding, all questions fully answered.   Patient instructed to return to ED, call 911, or call MD for any changes in condition.   Patient escorted, and D/C home via private auto.  Micki Riley 12/05/2014 10:56 AM

## 2014-12-08 ENCOUNTER — Telehealth: Payer: Self-pay | Admitting: Gastroenterology

## 2014-12-08 ENCOUNTER — Encounter (HOSPITAL_COMMUNITY): Payer: Self-pay | Admitting: *Deleted

## 2014-12-08 ENCOUNTER — Inpatient Hospital Stay (HOSPITAL_COMMUNITY)
Admission: EM | Admit: 2014-12-08 | Discharge: 2014-12-19 | DRG: 386 | Disposition: A | Discharge status: Other Acute Inpatient Hospital | Payer: Medicaid Other | Attending: Internal Medicine | Admitting: Internal Medicine

## 2014-12-08 DIAGNOSIS — N3010 Interstitial cystitis (chronic) without hematuria: Secondary | ICD-10-CM | POA: Diagnosis present

## 2014-12-08 DIAGNOSIS — Z79891 Long term (current) use of opiate analgesic: Secondary | ICD-10-CM

## 2014-12-08 DIAGNOSIS — Z79899 Other long term (current) drug therapy: Secondary | ICD-10-CM

## 2014-12-08 DIAGNOSIS — Z515 Encounter for palliative care: Secondary | ICD-10-CM | POA: Diagnosis present

## 2014-12-08 DIAGNOSIS — R52 Pain, unspecified: Secondary | ICD-10-CM

## 2014-12-08 DIAGNOSIS — D638 Anemia in other chronic diseases classified elsewhere: Secondary | ICD-10-CM | POA: Diagnosis present

## 2014-12-08 DIAGNOSIS — Z8673 Personal history of transient ischemic attack (TIA), and cerebral infarction without residual deficits: Secondary | ICD-10-CM

## 2014-12-08 DIAGNOSIS — K5000 Crohn's disease of small intestine without complications: Principal | ICD-10-CM | POA: Diagnosis present

## 2014-12-08 DIAGNOSIS — G894 Chronic pain syndrome: Secondary | ICD-10-CM | POA: Diagnosis present

## 2014-12-08 DIAGNOSIS — R109 Unspecified abdominal pain: Secondary | ICD-10-CM

## 2014-12-08 DIAGNOSIS — R1084 Generalized abdominal pain: Secondary | ICD-10-CM | POA: Diagnosis present

## 2014-12-08 DIAGNOSIS — I81 Portal vein thrombosis: Secondary | ICD-10-CM | POA: Diagnosis present

## 2014-12-08 DIAGNOSIS — K56699 Other intestinal obstruction unspecified as to partial versus complete obstruction: Secondary | ICD-10-CM | POA: Diagnosis present

## 2014-12-08 DIAGNOSIS — Z91030 Bee allergy status: Secondary | ICD-10-CM

## 2014-12-08 DIAGNOSIS — K529 Noninfective gastroenteritis and colitis, unspecified: Secondary | ICD-10-CM

## 2014-12-08 DIAGNOSIS — Z881 Allergy status to other antibiotic agents status: Secondary | ICD-10-CM

## 2014-12-08 DIAGNOSIS — G40909 Epilepsy, unspecified, not intractable, without status epilepticus: Secondary | ICD-10-CM | POA: Diagnosis present

## 2014-12-08 DIAGNOSIS — Z888 Allergy status to other drugs, medicaments and biological substances status: Secondary | ICD-10-CM

## 2014-12-08 DIAGNOSIS — K5669 Other intestinal obstruction: Secondary | ICD-10-CM | POA: Diagnosis present

## 2014-12-08 DIAGNOSIS — F17210 Nicotine dependence, cigarettes, uncomplicated: Secondary | ICD-10-CM | POA: Diagnosis present

## 2014-12-08 DIAGNOSIS — Z7901 Long term (current) use of anticoagulants: Secondary | ICD-10-CM

## 2014-12-08 DIAGNOSIS — Z86718 Personal history of other venous thrombosis and embolism: Secondary | ICD-10-CM

## 2014-12-08 DIAGNOSIS — D509 Iron deficiency anemia, unspecified: Secondary | ICD-10-CM | POA: Diagnosis present

## 2014-12-08 DIAGNOSIS — R569 Unspecified convulsions: Secondary | ICD-10-CM

## 2014-12-08 HISTORY — DX: Morbid (severe) obesity due to excess calories: E66.01

## 2014-12-08 HISTORY — DX: Anemia, unspecified: D64.9

## 2014-12-08 HISTORY — DX: Other primary thrombophilia: D68.59

## 2014-12-08 LAB — COMPREHENSIVE METABOLIC PANEL
ALT: 40 U/L (ref 14–54)
AST: 33 U/L (ref 15–41)
Albumin: 3.5 g/dL (ref 3.5–5.0)
Alkaline Phosphatase: 47 U/L (ref 38–126)
Anion gap: 8 (ref 5–15)
BUN: 9 mg/dL (ref 6–20)
CO2: 25 mmol/L (ref 22–32)
Calcium: 9 mg/dL (ref 8.9–10.3)
Chloride: 101 mmol/L (ref 101–111)
Creatinine, Ser: 0.77 mg/dL (ref 0.44–1.00)
GFR calc Af Amer: >60 mL/min (ref >60)
GFR calc non Af Amer: >60 mL/min (ref >60)
Glucose, Bld: 98 mg/dL (ref 65–99)
Potassium: 3.9 mmol/L (ref 3.5–5.1)
Sodium: 134 mmol/L — ABNORMAL LOW (ref 135–145)
Total Bilirubin: 1.1 mg/dL (ref 0.3–1.2)
Total Protein: 5.6 g/dL — ABNORMAL LOW (ref 6.5–8.1)

## 2014-12-08 LAB — CBC WITH DIFFERENTIAL/PLATELET
Basophils Absolute: 0 10*3/uL (ref 0.0–0.1)
Basophils Relative: 0 %
Eosinophils Absolute: 0.1 10*3/uL (ref 0.0–0.7)
Eosinophils Relative: 1 %
HCT: 38 % (ref 36.0–46.0)
Hemoglobin: 11.9 g/dL — ABNORMAL LOW (ref 12.0–15.0)
Lymphocytes Relative: 31 %
Lymphs Abs: 2.2 10*3/uL (ref 0.7–4.0)
MCH: 24.8 pg — ABNORMAL LOW (ref 26.0–34.0)
MCHC: 31.3 g/dL (ref 30.0–36.0)
MCV: 79.3 fL (ref 78.0–100.0)
Monocytes Absolute: 0.4 10*3/uL (ref 0.1–1.0)
Monocytes Relative: 5 %
Neutro Abs: 4.3 10*3/uL (ref 1.7–7.7)
Neutrophils Relative %: 63 %
Platelets: 237 10*3/uL (ref 150–400)
RBC: 4.79 MIL/uL (ref 3.87–5.11)
RDW: 24.9 % — ABNORMAL HIGH (ref 11.5–15.5)
Smear Review: NORMAL
WBC: 7 10*3/uL (ref 4.0–10.5)

## 2014-12-08 LAB — I-STAT CG4 LACTIC ACID, ED: Lactic Acid, Venous: 1.36 mmol/L (ref 0.5–2.0)

## 2014-12-08 LAB — LIPASE, BLOOD: Lipase: 26 U/L (ref 22–51)

## 2014-12-08 MED ORDER — HYDROMORPHONE HCL 1 MG/ML IJ SOLN
1.0000 mg | Freq: Once | INTRAMUSCULAR | Status: AC
  Administered 2014-12-08: 1 mg via INTRAVENOUS
  Filled 2014-12-08: qty 1

## 2014-12-08 MED ORDER — KETAMINE HCL 10 MG/ML IJ SOLN
0.3000 mg/kg | Freq: Once | INTRAMUSCULAR | Status: AC
  Administered 2014-12-08: 27 mg via INTRAVENOUS

## 2014-12-08 MED ORDER — SODIUM CHLORIDE 0.9 % IV BOLUS (SEPSIS)
1000.0000 mL | Freq: Once | INTRAVENOUS | Status: AC
  Administered 2014-12-08: 1000 mL via INTRAVENOUS

## 2014-12-08 MED ORDER — HYDROMORPHONE HCL 1 MG/ML IJ SOLN
1.0000 mg | Freq: Once | INTRAMUSCULAR | Status: AC
Start: 2014-12-08 — End: 2014-12-08
  Administered 2014-12-08: 1 mg via INTRAVENOUS
  Filled 2014-12-08: qty 1

## 2014-12-08 MED ORDER — ONDANSETRON HCL 4 MG/2ML IJ SOLN
4.0000 mg | Freq: Once | INTRAMUSCULAR | Status: AC
  Administered 2014-12-08: 4 mg via INTRAVENOUS
  Filled 2014-12-08: qty 2

## 2014-12-08 NOTE — ED Notes (Signed)
Pt in c/o increased abd pain with uncontrollable vomiting since earlier this evening, pt was discharged from the hospital a few days ago after a three week stay for similar problems, had abdominal surgery and is in need of a bowel resection but they are unable to due to blood thinner dose and recent blood clots

## 2014-12-08 NOTE — ED Provider Notes (Signed)
CSN: [id number]     Arrival date & time 12/08/14  2028 History   First MD Initiated Contact with Patient 12/08/14 2037     Chief Complaint  Patient presents with  . Abdominal Pain   Patient is a 24 y.o. female presenting with abdominal pain. The history is provided by the patient.  Abdominal Pain Pain location:  Generalized Pain quality: aching   Pain severity:  Severe Onset quality:  Gradual Timing:  Constant Progression:  Worsening Chronicity:  Chronic Associated symptoms: anorexia, flatus, nausea and vomiting   Associated symptoms: no chest pain, no diarrhea, no dysuria, no fever, no shortness of breath, no vaginal bleeding and no vaginal discharge        Past Medical History  Diagnosis Date  . Interstitial cystitis     pain from this was reason for starting opioids age 22.   . Chronic pelvic pain in female   . Headache(784.0)   . Pyelonephritis affecting pregnancy in first trimester July 2015  . Depression 2007  . Opioid dependence     to Rx opioids.  switched to Methadone after second child born in 04/2014  . Pneumonia 03/2014  . Seizures   . Stroke 05/2014    presented with right sided weakness.   . Numbness     right side  . Dizziness 07/18/14  . Confusion   . 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 Preeclampsia 2012  . Miscarriage 2005  . Non-compliance     with anticoagulant  . 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 Cerebral venous sinus thrombosis    Past Surgical History  Procedure Laterality Date  . Vagina surgery  ~ 2011    benign vaginal tumor removed at [facility].   . Cesarean section N/A 05/06/2014    Procedure: CESAREAN SECTION;  Surgeon: Levie Heritage, DO;  Location: [facility];  Service: Obstetrics;  Laterality: N/A;  . Inguinal hernia repair Left 2008  . Colonoscopy with propofol N/A 11/18/2014    Procedure: COLONOSCOPY WITH PROPOFOL;  Surgeon: Rachael Fee, MD;  [facility] ENDOSCOPY;  Service: Endoscopy;  Laterality: N/A;   Family History  Problem  Relation Age of Onset  . Diabetes Mother   . Heart disease Mother   . Hypertension Father   . Diabetes Maternal Grandmother   . Hypertension Maternal Grandmother   . Stroke Maternal Grandmother   . Diabetes Maternal Grandfather   . Hypertension Maternal Grandfather   . Diabetes Paternal Grandmother   . Diabetes Paternal Grandfather    Social History  Substance Use Topics  . Smoking status: Current Every Day Smoker -- 0.50 packs/day for 8 years    Types: Cigarettes  . Smokeless tobacco: Never Used     Comment: cutting back  . Alcohol Use: No   OB History    Gravida Para Term Preterm AB TAB SAB Ectopic Multiple Living   0 1 0 1 0 0 2     Review of Systems  Constitutional: Negative for fever and appetite change.  Respiratory: Negative for shortness of breath.   Cardiovascular: Negative for chest pain.  Gastrointestinal: Positive for nausea, vomiting, abdominal pain, anorexia and flatus. Negative for diarrhea.  Genitourinary: Negative for dysuria, vaginal bleeding and vaginal discharge.  All other systems reviewed and are negative.     Allergies  Ativan; Bee venom; and Ciprofloxacin  Home Medications   Prior to Admission medications   Medication Sig Start Date End Date Taking? Authorizing Provider  apixaban (ELIQUIS) 5  MG TABS tablet Take 1 tablet (5 mg total) by mouth 2 (two) times daily. 10/16/14 02/03/15  Leroy Sea, MD  docusate sodium (COLACE) 100 MG capsule Take 2 capsules (200 mg total) by mouth 2 (two) times daily. 12/05/14   Calvert Cantor, MD  EPINEPHrine 0.3 mg/0.3 mL IJ SOAJ injection Inject 0.3 mLs (0.3 mg total) into the muscle once. 06/16/14   Jaclyn Shaggy, MD  FLUoxetine (PROZAC) 20 MG capsule Take 1 capsule (20 mg total) by mouth daily. 07/18/14   Micki Riley, MD  levETIRAcetam (KEPPRA) 500 MG tablet Take 1 tablet (500 mg total) by mouth 2 (two) times daily. 06/13/14   Richarda Overlie, MD  Linaclotide (LINZESS) 145 MCG CAPS capsule Take 1 capsule (145 mcg  total) by mouth daily. 11/06/14   Shanker Levora Dredge, MD  methadone (DOLOPHINE) 5 MG tablet Take 17 tablets (85 mg total) by mouth daily. Home medication continued 10/16/14   Leroy Sea, MD  nicotine (NICODERM CQ - DOSED IN MG/24 HOURS) 21 mg/24hr patch Place 1 patch (21 mg total) onto the skin daily. 11/29/14   Rhetta Mura, MD  ondansetron (ZOFRAN ODT) 4 MG disintegrating tablet Take 1 tablet (4 mg total) by mouth every 8 (eight) hours as needed for nausea or vomiting. 12/05/14   Calvert Cantor, MD  oxyCODONE-acetaminophen (PERCOCET/ROXICET) 5-325 MG per tablet Take 2 tablets by mouth every 4 (four) hours as needed for moderate pain. 12/05/14   Calvert Cantor, MD  polyethylene glycol (MIRALAX / GLYCOLAX) packet Take 17 g by mouth daily. 11/06/14   Shanker Levora Dredge, MD   BP 148/81 mmHg  Pulse 112  Temp(Src) 98.5 F (36.9 C) (Oral)  Resp 22  Wt 202 lb (91.627 kg)  SpO2 99%  LMP  (Within Months) Physical Exam  Constitutional: She is oriented to person, place, and time. She appears well-developed and well-nourished. She appears distressed.  HENT:  Head: Normocephalic.  Eyes: Pupils are equal, round, and reactive to light.  Neck: Normal range of motion.  Cardiovascular:  Tachycardic  Pulmonary/Chest: Effort normal. No respiratory distress. She has no wheezes.  Abdominal: Soft. She exhibits no distension and no mass. There is no rebound and no guarding.  Diffuse tenderness without rebound  Musculoskeletal: Normal range of motion.  Neurological: She is alert and oriented to person, place, and time. No cranial nerve deficit.  Skin: Skin is warm and dry. She is not diaphoretic.  Psychiatric: Her behavior is normal.  Nursing note and vitals reviewed.   ED Course  Procedures (including critical care time) Labs Review Labs Reviewed  CBC WITH DIFFERENTIAL/PLATELET - Abnormal; Notable for the following:    Hemoglobin 11.9 (*)    MCH 24.8 (*)    RDW 24.9 (*)    All other components  within normal limits  COMPREHENSIVE METABOLIC PANEL - Abnormal; Notable for the following:    Sodium 134 (*)    Total Protein 5.6 (*)    All other components within normal limits  URINALYSIS, ROUTINE W REFLEX MICROSCOPIC (NOT AT [facility]) - Abnormal; Notable for the following:    APPearance CLOUDY (*)    All other components within normal limits  LIPASE, BLOOD  I-STAT CG4 LACTIC ACID, ED  I-STAT CG4 LACTIC ACID, ED    MDM   Patient is a 24 year old with multiple comorbidities including previous portal vein thrombosis on July 29 of this year as well as a terminal ileal stricture where she required hospitalization for pain management who was recently discharged 3  days ago. During her stay surgery has evaluated her and recommended that she not require not have any surgical intervention requiring her pain, she became obstructed. Patient still producing flatulence and bowel movements but woke up this morning with sudden onset of severe abdominal pain with nausea and vomiting. Patient was initially tachycardic but after fluids and Dilaudid and ketamine she is improved. Patient has no lactic acidosis. Doubt mesenteric ischemia, SBO or perforation given labs and exam. Patient was admitted to the hospital for further pain management and further evaluation.  Final diagnoses:  Generalized abdominal pain  Ileitis      Deirdre Peer, MD 12/09/14 5830  Pricilla Loveless, MD 12/11/14 0021

## 2014-12-08 NOTE — ED Notes (Signed)
Called lab to confirm that new blood orders were in process after sending blood up prior to orders and it is confirmed.

## 2014-12-08 NOTE — Telephone Encounter (Signed)
Per Dr Ardis Hughs this is a Dr Fuller Plan pt.

## 2014-12-08 NOTE — ED Notes (Signed)
Pt unable to void at this time. 

## 2014-12-08 NOTE — Telephone Encounter (Signed)
Patient with vomiting and diffuse abdominal pain that started abruptly today.  She has had 3 episodes of vomiting today and despite po phenergan she is not able to keep fluids down.  Discussed with Cecille Rubin Hvozdovic, PA and patient advised to go to the ER for evaluation.

## 2014-12-09 ENCOUNTER — Encounter (HOSPITAL_COMMUNITY): Payer: Self-pay | Admitting: Internal Medicine

## 2014-12-09 DIAGNOSIS — K5900 Constipation, unspecified: Secondary | ICD-10-CM | POA: Diagnosis not present

## 2014-12-09 DIAGNOSIS — R1084 Generalized abdominal pain: Secondary | ICD-10-CM | POA: Diagnosis not present

## 2014-12-09 DIAGNOSIS — K5000 Crohn's disease of small intestine without complications: Secondary | ICD-10-CM | POA: Diagnosis not present

## 2014-12-09 DIAGNOSIS — D509 Iron deficiency anemia, unspecified: Secondary | ICD-10-CM | POA: Diagnosis present

## 2014-12-09 DIAGNOSIS — Z888 Allergy status to other drugs, medicaments and biological substances status: Secondary | ICD-10-CM | POA: Diagnosis not present

## 2014-12-09 DIAGNOSIS — Z881 Allergy status to other antibiotic agents status: Secondary | ICD-10-CM | POA: Diagnosis not present

## 2014-12-09 DIAGNOSIS — F17210 Nicotine dependence, cigarettes, uncomplicated: Secondary | ICD-10-CM | POA: Diagnosis present

## 2014-12-09 DIAGNOSIS — Z7901 Long term (current) use of anticoagulants: Secondary | ICD-10-CM | POA: Diagnosis not present

## 2014-12-09 DIAGNOSIS — K5669 Other intestinal obstruction: Secondary | ICD-10-CM | POA: Diagnosis present

## 2014-12-09 DIAGNOSIS — K529 Noninfective gastroenteritis and colitis, unspecified: Secondary | ICD-10-CM | POA: Diagnosis present

## 2014-12-09 DIAGNOSIS — G894 Chronic pain syndrome: Secondary | ICD-10-CM | POA: Diagnosis present

## 2014-12-09 DIAGNOSIS — R109 Unspecified abdominal pain: Secondary | ICD-10-CM | POA: Diagnosis not present

## 2014-12-09 DIAGNOSIS — Z515 Encounter for palliative care: Secondary | ICD-10-CM | POA: Diagnosis not present

## 2014-12-09 DIAGNOSIS — Z86718 Personal history of other venous thrombosis and embolism: Secondary | ICD-10-CM | POA: Diagnosis not present

## 2014-12-09 DIAGNOSIS — Z8673 Personal history of transient ischemic attack (TIA), and cerebral infarction without residual deficits: Secondary | ICD-10-CM | POA: Diagnosis not present

## 2014-12-09 DIAGNOSIS — Z79891 Long term (current) use of opiate analgesic: Secondary | ICD-10-CM | POA: Diagnosis not present

## 2014-12-09 DIAGNOSIS — R1033 Periumbilical pain: Secondary | ICD-10-CM | POA: Diagnosis not present

## 2014-12-09 DIAGNOSIS — N3010 Interstitial cystitis (chronic) without hematuria: Secondary | ICD-10-CM | POA: Diagnosis present

## 2014-12-09 DIAGNOSIS — Z79899 Other long term (current) drug therapy: Secondary | ICD-10-CM | POA: Diagnosis not present

## 2014-12-09 DIAGNOSIS — R933 Abnormal findings on diagnostic imaging of other parts of digestive tract: Secondary | ICD-10-CM | POA: Diagnosis not present

## 2014-12-09 DIAGNOSIS — Z91030 Bee allergy status: Secondary | ICD-10-CM | POA: Diagnosis not present

## 2014-12-09 DIAGNOSIS — R569 Unspecified convulsions: Secondary | ICD-10-CM | POA: Diagnosis not present

## 2014-12-09 DIAGNOSIS — G40909 Epilepsy, unspecified, not intractable, without status epilepticus: Secondary | ICD-10-CM | POA: Diagnosis present

## 2014-12-09 DIAGNOSIS — R1031 Right lower quadrant pain: Secondary | ICD-10-CM | POA: Diagnosis not present

## 2014-12-09 DIAGNOSIS — I81 Portal vein thrombosis: Secondary | ICD-10-CM | POA: Diagnosis not present

## 2014-12-09 DIAGNOSIS — D638 Anemia in other chronic diseases classified elsewhere: Secondary | ICD-10-CM | POA: Diagnosis present

## 2014-12-09 LAB — COMPREHENSIVE METABOLIC PANEL
ALT: 33 U/L (ref 14–54)
AST: 25 U/L (ref 15–41)
Albumin: 3 g/dL — ABNORMAL LOW (ref 3.5–5.0)
Alkaline Phosphatase: 42 U/L (ref 38–126)
Anion gap: 8 (ref 5–15)
BUN: 6 mg/dL (ref 6–20)
CO2: 25 mmol/L (ref 22–32)
Calcium: 8.7 mg/dL — ABNORMAL LOW (ref 8.9–10.3)
Chloride: 109 mmol/L (ref 101–111)
Creatinine, Ser: 0.81 mg/dL (ref 0.44–1.00)
GFR calc Af Amer: >60 mL/min (ref >60)
GFR calc non Af Amer: >60 mL/min (ref >60)
Glucose, Bld: 96 mg/dL (ref 65–99)
Potassium: 4.1 mmol/L (ref 3.5–5.1)
Sodium: 142 mmol/L (ref 135–145)
Total Bilirubin: 0.8 mg/dL (ref 0.3–1.2)
Total Protein: 4.7 g/dL — ABNORMAL LOW (ref 6.5–8.1)

## 2014-12-09 LAB — APTT
aPTT: 74 seconds — ABNORMAL HIGH (ref 24–37)
aPTT: 65 seconds — ABNORMAL HIGH (ref 24–37)

## 2014-12-09 LAB — URINALYSIS, ROUTINE W REFLEX MICROSCOPIC
Bilirubin Urine: NEGATIVE
Glucose, UA: NEGATIVE mg/dL
Hgb urine dipstick: NEGATIVE
Ketones, ur: NEGATIVE mg/dL
Leukocytes, UA: NEGATIVE
Nitrite: NEGATIVE
Protein, ur: NEGATIVE mg/dL
Specific Gravity, Urine: 1.009 (ref 1.005–1.030)
Urobilinogen, UA: 1 mg/dL (ref 0.0–1.0)
pH: 5.5 (ref 5.0–8.0)

## 2014-12-09 LAB — GLUCOSE, CAPILLARY
Glucose-Capillary: 99 mg/dL (ref 65–99)
Glucose-Capillary: 96 mg/dL (ref 65–99)

## 2014-12-09 LAB — CBC WITH DIFFERENTIAL/PLATELET
Basophils Absolute: 0 10*3/uL (ref 0.0–0.1)
Basophils Relative: 0 %
Eosinophils Absolute: 0.1 10*3/uL (ref 0.0–0.7)
Eosinophils Relative: 1 %
HCT: 31.9 % — ABNORMAL LOW (ref 36.0–46.0)
Hemoglobin: 9.8 g/dL — ABNORMAL LOW (ref 12.0–15.0)
Lymphocytes Relative: 43 %
Lymphs Abs: 2.1 10*3/uL (ref 0.7–4.0)
MCH: 24.3 pg — ABNORMAL LOW (ref 26.0–34.0)
MCHC: 30.7 g/dL (ref 30.0–36.0)
MCV: 79.2 fL (ref 78.0–100.0)
Monocytes Absolute: 0.3 10*3/uL (ref 0.1–1.0)
Monocytes Relative: 6 %
Neutro Abs: 2.4 10*3/uL (ref 1.7–7.7)
Neutrophils Relative %: 50 %
Platelets: 203 10*3/uL (ref 150–400)
RBC: 4.03 MIL/uL (ref 3.87–5.11)
RDW: 25.6 % — ABNORMAL HIGH (ref 11.5–15.5)
WBC: 4.9 10*3/uL (ref 4.0–10.5)

## 2014-12-09 LAB — HEPARIN LEVEL (UNFRACTIONATED)
Heparin Unfractionated: 0.75 IU/mL — ABNORMAL HIGH (ref 0.30–0.70)
Heparin Unfractionated: 0.55 IU/mL (ref 0.30–0.70)

## 2014-12-09 LAB — PREGNANCY, URINE: Preg Test, Ur: NEGATIVE

## 2014-12-09 MED ORDER — ACETAMINOPHEN 325 MG PO TABS: 650.0000 mg | ORAL_TABLET | Freq: Four times a day (QID) | ORAL | Status: DC | PRN

## 2014-12-09 MED ORDER — SODIUM CHLORIDE 0.9 % IV SOLN
500.0000 mg | Freq: Two times a day (BID) | INTRAVENOUS | Status: DC
  Administered 2014-12-09 – 2014-12-10 (×4): 500 mg via INTRAVENOUS
  Filled 2014-12-09 (×5): qty 5

## 2014-12-09 MED ORDER — ONDANSETRON HCL 4 MG PO TABS
4.0000 mg | ORAL_TABLET | Freq: Four times a day (QID) | ORAL | Status: DC | PRN
  Filled 2014-12-09: qty 1

## 2014-12-09 MED ORDER — HEPARIN (PORCINE) IN NACL 100-0.45 UNIT/ML-% IJ SOLN
1200.0000 [IU]/h | INTRAMUSCULAR | Status: DC
  Administered 2014-12-09: 1000 [IU]/h via INTRAVENOUS
  Administered 2014-12-10: 1100 [IU]/h via INTRAVENOUS
  Administered 2014-12-10: 1000 [IU]/h via INTRAVENOUS
  Administered 2014-12-11: 1100 [IU]/h via INTRAVENOUS
  Administered 2014-12-12: 1200 [IU]/h via INTRAVENOUS
  Filled 2014-12-09 (×5): qty 250

## 2014-12-09 MED ORDER — DEXTROSE-NACL 5-0.9 % IV SOLN
INTRAVENOUS | Status: AC
  Administered 2014-12-09: 05:00:00 via INTRAVENOUS

## 2014-12-09 MED ORDER — ACETAMINOPHEN 650 MG RE SUPP: 650.0000 mg | Freq: Four times a day (QID) | RECTAL | Status: DC | PRN

## 2014-12-09 MED ORDER — HYDROMORPHONE HCL 1 MG/ML IJ SOLN
1.0000 mg | INTRAMUSCULAR | Status: DC | PRN
  Administered 2014-12-09: 1 mg via INTRAVENOUS
  Filled 2014-12-09: qty 1

## 2014-12-09 MED ORDER — HYDROMORPHONE HCL 1 MG/ML IJ SOLN
1.0000 mg | INTRAMUSCULAR | Status: DC | PRN
  Administered 2014-12-09 – 2014-12-16 (×50): 1 mg via INTRAVENOUS
  Filled 2014-12-09 (×50): qty 1

## 2014-12-09 MED ORDER — POLYETHYLENE GLYCOL 3350 17 G PO PACK
17.0000 g | PACK | Freq: Two times a day (BID) | ORAL | Status: DC
  Administered 2014-12-10 – 2014-12-19 (×16): 17 g via ORAL
  Filled 2014-12-09 (×17): qty 1

## 2014-12-09 MED ORDER — ONDANSETRON HCL 4 MG/2ML IJ SOLN
4.0000 mg | Freq: Four times a day (QID) | INTRAMUSCULAR | Status: DC | PRN
  Administered 2014-12-09 – 2014-12-19 (×17): 4 mg via INTRAVENOUS
  Filled 2014-12-09 (×17): qty 2

## 2014-12-09 MED ORDER — SORBITOL 70 % SOLN
960.0000 mL | TOPICAL_OIL | Freq: Once | ORAL | Status: AC
  Administered 2014-12-09: 960 mL via RECTAL
  Filled 2014-12-09: qty 240

## 2014-12-09 MED ORDER — FLUOXETINE HCL 20 MG PO CAPS
20.0000 mg | ORAL_CAPSULE | Freq: Every day | ORAL | Status: DC
  Administered 2014-12-09 – 2014-12-15 (×7): 20 mg via ORAL
  Filled 2014-12-09 (×7): qty 1

## 2014-12-09 MED ORDER — METHADONE HCL 10 MG PO TABS
85.0000 mg | ORAL_TABLET | Freq: Every day | ORAL | Status: DC
  Administered 2014-12-09 – 2014-12-19 (×11): 85 mg via ORAL
  Filled 2014-12-09 (×11): qty 9

## 2014-12-09 NOTE — Consult Note (Signed)
[facility] Consult: 11:53 AM 12/09/2014  LOS: 0 days    Referring Provider: dr Verlon Au.   Primary Care Physician:  Minerva Ends, MD Primary Gastroenterologist:  Dr. Fuller Plan    Reason for Consultation:  Abdominal pain, vomiting   HPI: Victoria Alvarez is a 24 y.o. female.  Past mental history depression, substance abuse, interstitial cystitis, chronic pain. Hypercoagulable disorder.  On chronic methadone due to narcotics dependency. Patient well-known to our GI service due to recent admissions with terminal ileitis.  History of hypercoagulable disorder and CVA. Portal vein thrombosis diagnosed 09/2014 at which time Eliquis initiated. Seen in late July by Dr. Fuller Plan for abdominal pain/constipation/hematochezia.  CT scan then showed pneumatosis of the cecum/ascending colon. CT scan repeated 8/2 redemonstrating occlusive left portal vein thrombosis but the main portal, superior mesenteric and splenic veins are patent. Redemonstrated mild wall thickening at terminal ileum and cecum with surrounding inflammation, possibly secondary to infectious enteritis/vasculitis/venous thrombosis.  Dr. Ardis Hughs felt that the colonic inflammation was due to ischemic injury. Because of her lifelong history of constipation she was started on Linzess but this was not continued at discharge on 10/16/14  Patient was again admitted 8/24 through 11/06/2014 with abdominal pain/N/V.   Repeat CT scan with angiogram of 11/03/14 showed increase mesenteric stranding and lymphadenopathy in the region of long-segment distal ileal thickening and focal terminal ileal distention.  She was consult by both GI and general surgery. Surgery favored antibiotic treatment.  Patient admitted 11/14/14 - 12/05/14 with similar GI complaints of abdominal pain, nausea  vomiting.  11/14/14 CT scan Redemonstrated distal ileal enteritis and adjacent inflammation with small amount of fluid.  After failed attempted colonoscopy 9/7, due to poor prep, successful colonoscopy on 9/9 showed distal ileal stricture and active ileitis. Initial pathology was that of inflammatory bowel disease versus drug-induced (NSAID) induced injury.  Steroids were initiated after the colonoscopy but she failed to improve. TB and hepatitis B testing performed in case she needed to start on biologic therapy. CT angio 9/15 demonstrated a 20 a.m. segment of distal ileal thickening but sparing of the terminal ileum and cecum. Upstream from this segment there was some small bowel distention without obstruction. Also seen was bilateral symmetric sacroiliitis which raised the specter of Crohn's disease as the cause of her ileitis Surgery had not wanted to perform resection until she was at least 2 weeks off prednisone. Surgery also felt that she would be best served at a tertiary care facility.  She was placed on the waiting list at [facility] but never ended up transferred there and was discharged from [facility]. At discharge this Monday her med list included Eliquis, linzess, Miralax, soap suds enemas prn,  but not steroids.  Pain had improved but she was not pain free at discharge.  She was tolerating soft diet.   At 5 AM Thursday, 12/08/14, she had acute worsening of the left sided and generalized abdominal pain as well as bilious emesis. No fever or chills. No blood per rectum. She had been having bowel movements which were daily but  small in volume. Patient advised to come to the ED from which she was admitted.  Patient now readmitted with progressive abdominal pain, nausea, vomiting.   11/16/14  Colonoscopy. Dr. Ardis Hughs.  Large volume of stool filling sigmoid colon study aborted. 11/18/14 Colonoscopy.  Dr. Ardis Hughs.  Normal colonic mucosa. The most terminal ileum was normal. However, tight distal  ileal stricture associated with ulcerated inflammation the distal end of which was about 10-15 cm proximal to the IC valve.  Pathology confirmed chronic, active ileitis" The case is still best diagnosed as chronic active ileitis and the differential still includes active inflammatory bowel disease and drug induced injury (especially NSAID induced injury). However, given this new clinical information, the additional differentials of chronic ischemic injury due to a vascular etiology or an infectious process should also be added.    Past Medical History  Diagnosis Date  . Interstitial cystitis     pain from this was reason for starting opioids age 32.   . Chronic pelvic pain in female   . Headache(784.0)   . Pyelonephritis affecting pregnancy in first trimester July 2015  . Depression 2007  . Opioid dependence     to Rx opioids.  switched to Methadone after second child born in 04/2014  . Pneumonia 03/2014  . Seizures   . Stroke 05/2014    presented with right sided weakness.   . Numbness     right side  . Dizziness 07/18/14  . Confusion   . 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 Preeclampsia 2012  . Miscarriage 2005  . Non-compliance     with anticoagulant  . 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 Cerebral venous sinus thrombosis     Past Surgical History  Procedure Laterality Date  . Vagina surgery  ~ 2011    benign vaginal tumor removed at [facility].   . Cesarean section N/A 05/06/2014    Procedure: CESAREAN SECTION;  Surgeon: Truett Mainland, DO;  Location: [facility];  Service: Obstetrics;  Laterality: N/A;  . Inguinal hernia repair Left 2008  . Colonoscopy with propofol N/A 11/18/2014    Procedure: COLONOSCOPY WITH PROPOFOL;  Surgeon: Milus Banister, MD;  Location: [facility];  Service: Endoscopy;  Laterality: N/A;    Prior to Admission medications   Medication Sig Start Date End Date Taking? Authorizing Provider  apixaban (ELIQUIS) 5 MG TABS tablet Take 1 tablet (5 mg total) by mouth 2  (two) times daily. 10/16/14 02/03/15 Yes Thurnell Lose, MD  EPINEPHrine 0.3 mg/0.3 mL IJ SOAJ injection Inject 0.3 mLs (0.3 mg total) into the muscle once. 06/16/14  Yes Arnoldo Morale, MD  FLUoxetine (PROZAC) 20 MG capsule Take 1 capsule (20 mg total) by mouth daily. 07/18/14  Yes Garvin Fila, MD  levETIRAcetam (KEPPRA) 500 MG tablet Take 1 tablet (500 mg total) by mouth 2 (two) times daily. 06/13/14  Yes Reyne Dumas, MD  Linaclotide (LINZESS) 145 MCG CAPS capsule Take 1 capsule (145 mcg total) by mouth daily. 11/06/14  Yes Shanker Kristeen Mans, MD  methadone (DOLOPHINE) 5 MG tablet Take 17 tablets (85 mg total) by mouth daily. Home medication continued Patient taking differently: Take 84 mg by mouth daily. Home medication continued. Patient states she takes 17 tablets every day per patient 10/16/14  Yes Thurnell Lose, MD  nicotine (NICODERM CQ - DOSED IN MG/24 HOURS) 21 mg/24hr patch Place 1 patch (21 mg total) onto the skin daily. 11/29/14  Yes Nita Sells, MD  ondansetron (ZOFRAN ODT) 4 MG  disintegrating tablet Take 1 tablet (4 mg total) by mouth every 8 (eight) hours as needed for nausea or vomiting. 12/05/14  Yes Debbe Odea, MD  oxyCODONE-acetaminophen (PERCOCET/ROXICET) 5-325 MG per tablet Take 2 tablets by mouth every 4 (four) hours as needed for moderate pain. 12/05/14  Yes Debbe Odea, MD  docusate sodium (COLACE) 100 MG capsule Take 2 capsules (200 mg total) by mouth 2 (two) times daily. Patient not taking: Reported on 12/08/2014 12/05/14   Debbe Odea, MD  polyethylene glycol (MIRALAX / GLYCOLAX) packet Take 17 g by mouth daily. Patient not taking: Reported on 12/08/2014 11/06/14   Jonetta Osgood, MD    Scheduled Meds: . FLUoxetine  20 mg Oral Daily  . levETIRAcetam  500 mg Intravenous BID  . methadone  85 mg Oral Q breakfast   Infusions: . dextrose 5 % and 0.9% NaCl 100 mL/hr at 12/09/14 0456  . heparin 1,000 Units/hr (12/09/14 0316)   PRN Meds: acetaminophen **OR**  acetaminophen, HYDROmorphone (DILAUDID) injection, ondansetron **OR** ondansetron (ZOFRAN) IV   Allergies as of 12/08/2014 - Review Complete 12/08/2014  Allergen Reaction Noted  . Ativan [lorazepam] Other (See Comments) 07/18/2014  . Bee venom Swelling 06/16/2014  . Ciprofloxacin Diarrhea and Nausea And Vomiting 07/10/2012    Family History  Problem Relation Age of Onset  . Diabetes Mother   . Heart disease Mother   . Hypertension Father   . Diabetes Maternal Grandmother   . Hypertension Maternal Grandmother   . Stroke Maternal Grandmother   . Diabetes Maternal Grandfather   . Hypertension Maternal Grandfather   . Diabetes Paternal Grandmother   . Diabetes Paternal Grandfather     Social History   Social History  . Marital Status: Single    Spouse Name: N/A  . Number of Children: 2  . Years of Education: 9   Occupational History  . homemaker    Social History Main Topics  . Smoking status: Current Every Day Smoker -- 0.50 packs/day for 8 years    Types: Cigarettes  . Smokeless tobacco: Never Used     Comment: cutting back  . Alcohol Use: No  . Drug Use: No     Comment: Pt on Methadone "was on pain pills; weaned off while I was pregnant so it would be safer"  . Sexual Activity: Yes    Birth Control/ Protection: None   Other Topics Concern  . Not on file   Social History Narrative   Has significant other   Right handed   Caffeine use - none    REVIEW OF SYSTEMS: Constitutional:   Feels terrible, malaise. No weight loss. ENT:  No nose bleeds Pulm:   No difficulty breathing or cough. CV:  No palpitations, no LE edema.  GU:  No hematuria, no frequency GI:  Per HPI Heme:  No excessive bleeding or bruising.  Transfusions:  received Rh immunoglobulin transfusion at [facility] on 05/09/14. Neuro:  No headaches, no peripheral tingling or numbness Derm:  No itching, no rash or sores.  Endocrine:  No sweats or chills.  No polyuria or dysuria Immunization:   reviewed her records. I don't see any flu shot for the 2016/2017 flu season. Travel:  None beyond local counties in last few months.    PHYSICAL EXAM: Vital signs in last 24 hours: Filed Vitals:   12/09/14 0856  BP: 116/64  Pulse: 69  Temp: 98.5 F (36.9 C)  Resp: 16   Wt Readings from Last 3 Encounters:  12/09/14 202  lb 14.4 oz (92.035 kg)  11/14/14 201 lb (91.173 kg)  11/06/14 200 lb 9.9 oz (91 kg)    General:  morbidly obese, uncomfortable, depressed appearing WF. Does not appear acutely ill or toxic Head:   No facial asymmetry or signs of head trauma.  Eyes:   No scleral icterus or conjunctival pallor. Ears:   Not HOH  Nose:   No congestion or discharge Mouth:   Clear, moist oral MM. Neck:   No JVD, no TMG. No masses Lungs:   Clear bilaterally. No labored breathing or cough. Heart:  RRR. No MRG. Abdomen:   Obese, not distended. Bowel sounds hypoactive but no tinkling or tympanitic bowel sounds. Tender diffusely but worse on the right mid to lower region.   Rectal:  deferred   Musc/Skeltl:  no joint erythema, swelling or contracture deformities. Extremities:   No CCE.  Neurologic:   Oriented 3. Alert. No limb weakness. No tremor. No gross focal deficits. Skin:   No telangiectasia, sores or rashes Nodes:   No cervical adenopathy.   Psych:   Depressed, flat affect. Cooperative.  Intake/Output from previous day: 09/29 0701 - 09/30 0700 In: 0  Out: 250 [Urine:250] Intake/Output this shift:    LAB RESULTS:  Recent Labs  12/08/14 2115 12/09/14 0412  WBC 7.0 4.9  HGB 11.9* 9.8*  HCT 38.0 31.9*  PLT 237 203   BMET Lab Results  Component Value Date   NA 142 12/09/2014   NA 134* 12/08/2014   NA 136 12/02/2014   K 4.1 12/09/2014   K 3.9 12/08/2014   K 3.6 12/02/2014   CL 109 12/09/2014   CL 101 12/08/2014   CL 98* 12/02/2014   CO2 25 12/09/2014   CO2 25 12/08/2014   CO2 30 12/02/2014   GLUCOSE 96 12/09/2014   GLUCOSE 98 12/08/2014   GLUCOSE 88  12/02/2014   BUN 6 12/09/2014   BUN 9 12/08/2014   BUN 10 12/02/2014   CREATININE 0.81 12/09/2014   CREATININE 0.77 12/08/2014   CREATININE 0.83 12/02/2014   CALCIUM 8.7* 12/09/2014   CALCIUM 9.0 12/08/2014   CALCIUM 8.7* 12/02/2014   LFT  Recent Labs  12/08/14 2115 12/09/14 0412  PROT 5.6* 4.7*  ALBUMIN 3.5 3.0*  AST 33 25  ALT 40 33  ALKPHOS 47 42  BILITOT 1.1 0.8   PT/INR Lab Results  Component Value Date   INR 1.18 11/24/2014   INR 1.24 11/03/2014   INR 1.54* 10/11/2014   Hepatitis Panel No results for input(s): HEPBSAG, HCVAB, HEPAIGM, HEPBIGM in the last 72 hours. C-Diff No components found for: CDIFF Lipase     Component Value Date/Time   LIPASE 26 12/08/2014 2115    Drugs of Abuse     Component Value Date/Time   LABOPIA NONE DETECTED 11/03/2014 1729   LABOPIA NEG 03/01/2014 1006   COCAINSCRNUR NONE DETECTED 11/03/2014 1729   COCAINSCRNUR NEG 03/01/2014 1006   LABBENZ NONE DETECTED 11/03/2014 1729   LABBENZ NEG 03/01/2014 1006   AMPHETMU NONE DETECTED 11/03/2014 1729   AMPHETMU NEG 03/01/2014 1006   THCU NONE DETECTED 11/03/2014 1729   THCU PPS 03/01/2014 1006   THCU 42* 03/01/2014 1006   LABBARB NONE DETECTED 11/03/2014 1729   LABBARB NEG 03/01/2014 1006     RADIOLOGY STUDIES: No results found.  ENDOSCOPIC STUDIES: Per HPI  IMPRESSION:   *  Acute on chronic abdominal pain, nausea, vomiting. Ileitis with stricturing. Etiology includes possible Crohn's disease versus vascular injury, doubt this  is infectious. Constipation has been a significant contributor to the patient's symptoms in the past and her abdominal films we demonstrate large stool burden.   Dr. Hulen Skains from general surgery has evaluated the patient and feels her discomfort is out of proportion to her physical findings. He feels that tertiary care transfer/referral would be in the best interest of this patient. There is no plan for surgery at this point.  *  Anemia.  Recently  microcytic with low iron levels in 07/2014, currently normocytic.  *  History of hypercoagulable disorder. of portal vein thrombosis/sagittal sinus thrombosis. History CVA.  *  Chronic pain. On methadone maintenance. Has been taking narcotics since her mid teen years for management of interstitial cystitis.  Her opiate dependence certainly complicates her GI issues.    PLAN:     *  ? Transfer to [facility]?  Steroids did not help in past, so not restarting these.   *   Ordered smog enema.   Azucena Freed  12/09/2014, 11:53 AM Pager: [phone]  GI Attending Note   Chart was reviewed and patient was examined. X-rays and lab were reviewed.   It is difficult to sort out what the etiology of her ileal stricture is.  IBD and ischemia are possibilities.  In either case, is symptomatic from low-grade obstruction then surgery will be the most effective therapy.  At the same time would check IBD serologies, CRP and ESR.  Sandy Salaam. Deatra Ina, M.D., [facility] Cell [phone]

## 2014-12-09 NOTE — Progress Notes (Signed)
Patient seen examined Well known to me from previous hospital stay Definitive management of this case is difficult but probably will include surgery as a diagnostic and therapeutic aide as patient has failed OP management Runell Gess is what we we trying to achieve] Await further input from Gen surgery We will let GI know she is back  Verneita Griffes, MD [facility] [phone]

## 2014-12-09 NOTE — H&P (Signed)
[facility] History and Physical  Aiyla Baucom JHE:[id number] DOB: [birth year] DOA: 12/08/2014  Referring physician: Dr.Gaddy. PCP: Minerva Ends, MD  Specialists: Dr.Pyrtle. Gastroenterologist.  Chief Complaint: Abdominal pain.  HPI: Victoria Alvarez is a 24 y.o. 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 Patient also states patient had multiple episodes of nausea vomiting. Pain is mostly in the right lower quadrant. X-rays in the ER shows stool burden. On-call surgeon Dr. Redmond Pulling was consulted and patient has been admitted for further management. On my exam patient still has abdominal pain. Denies any chest pain shortness of breath fever chills or diarrhea.  Review of Systems: As presented in the history of presenting illness, rest negative.  Past Medical History  Diagnosis Date  . Interstitial cystitis     pain from this was reason for starting opioids age 86.   . Chronic pelvic pain in female   . Headache(784.0)   . Pyelonephritis affecting pregnancy in first trimester July 2015  . Depression 2007  . Opioid dependence     to Rx opioids.  switched to Methadone after second child born in 04/2014  . Pneumonia 03/2014  . Seizures   . Stroke 05/2014    presented with right sided weakness.   . Numbness     right side  . Dizziness 07/18/14  . Confusion   . 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 Preeclampsia 2012  . Miscarriage 2005  . Non-compliance     with anticoagulant  . 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 Cerebral venous sinus thrombosis     Past Surgical History  Procedure Laterality Date  . Vagina surgery  ~ 2011    benign vaginal tumor removed at [facility].   . Cesarean section N/A 05/06/2014    Procedure: CESAREAN SECTION;  Surgeon: Truett Mainland, DO;  Location: [facility] [place] [facility];  Service: Obstetrics;  Laterality: N/A;  . Inguinal hernia repair Left 2008  . Colonoscopy with propofol N/A 11/18/2014    Procedure: COLONOSCOPY WITH PROPOFOL;  Surgeon: Milus Banister, MD;  Location: [place];  Service: Endoscopy;  Laterality: N/A;   Social History:  reports that she has been smoking Cigarettes.  She has a 4 pack-year smoking history. She has never used smokeless tobacco. She reports that she does not drink alcohol or use illicit drugs. Where does patient live home. Can patient participate in ADLs? Yes.  Allergies  Allergen Reactions  . Ativan [Lorazepam] Other (See Comments)    " I don't remember anything."  . Bee Venom Swelling  . Ciprofloxacin Diarrhea and Nausea And Vomiting    Family History:  Family History  Problem Relation Age of Onset  . Diabetes Mother   . Heart disease Mother   . Hypertension Father   . Diabetes Maternal Grandmother   . Hypertension Maternal Grandmother   . Stroke Maternal Grandmother   . Diabetes Maternal Grandfather   . Hypertension Maternal Grandfather   . Diabetes Paternal Grandmother   . Diabetes Paternal Grandfather       Prior to Admission medications   Medication Sig Start Date End Date Taking? Authorizing Cohen Boettner  apixaban Arne Cleveland)  5 MG TABS tablet Take 1 tablet (5 mg total) by mouth 2 (two) times daily. 10/16/14 02/03/15 Yes Thurnell Lose, MD  EPINEPHrine 0.3 mg/0.3 mL IJ SOAJ injection Inject 0.3 mLs (0.3 mg total) into the muscle once. 06/16/14  Yes Arnoldo Morale, MD  FLUoxetine (PROZAC) 20 MG capsule Take 1 capsule (20 mg total) by mouth daily. 07/18/14  Yes Garvin Fila, MD  levETIRAcetam (KEPPRA) 500 MG tablet Take 1 tablet (500 mg total) by mouth 2 (two) times daily. 06/13/14   Yes Reyne Dumas, MD  Linaclotide (LINZESS) 145 MCG CAPS capsule Take 1 capsule (145 mcg total) by mouth daily. 11/06/14  Yes Shanker Kristeen Mans, MD  methadone (DOLOPHINE) 5 MG tablet Take 17 tablets (85 mg total) by mouth daily. Home medication continued Patient taking differently: Take 84 mg by mouth daily. Home medication continued. Patient states she takes 17 tablets every day per patient 10/16/14  Yes Thurnell Lose, MD  nicotine (NICODERM CQ - DOSED IN MG/24 HOURS) 21 mg/24hr patch Place 1 patch (21 mg total) onto the skin daily. 11/29/14  Yes Nita Sells, MD  ondansetron (ZOFRAN ODT) 4 MG disintegrating tablet Take 1 tablet (4 mg total) by mouth every 8 (eight) hours as needed for nausea or vomiting. 12/05/14  Yes Debbe Odea, MD  oxyCODONE-acetaminophen (PERCOCET/ROXICET) 5-325 MG per tablet Take 2 tablets by mouth every 4 (four) hours as needed for moderate pain. 12/05/14  Yes Debbe Odea, MD  docusate sodium (COLACE) 100 MG capsule Take 2 capsules (200 mg total) by mouth 2 (two) times daily. Patient not taking: Reported on 12/08/2014 12/05/14   Debbe Odea, MD  polyethylene glycol (MIRALAX / GLYCOLAX) packet Take 17 g by mouth daily. Patient not taking: Reported on 12/08/2014 11/06/14   Jonetta Osgood, MD    Physical Exam: Filed Vitals:   12/09/14 0015 12/09/14 0030 12/09/14 0045 12/09/14 0126  BP: 109/58 103/61 102/57 125/72  Pulse: 87 80 71 73  Temp:    98.6 F (37 C)  TempSrc:    Oral  Resp: 16 16 20 18   Height:    5' 2"  (1.575 m)  Weight:    92.035 kg (202 lb 14.4 oz)  SpO2: 100% 99% 99% 100%     General:  Moderately built and nourished.  Eyes: Anicteric. no pallor.  ENT: No discharge from the ears eyes nose and mouth.  Neck: No mass felt.  Cardiovascular: S1 and S2 heard.  Respiratory: No rhonchi or crepitations.  Abdomen: Right lower quadrant tenderness and guarding or rigidity. Bowel sounds not appreciated.  Skin: No rash.  Musculoskeletal: No  edema.  Psychiatric: Appears normal.  Neurologic: Alert awake oriented to time place and person. Moves all extremities.  Labs on Admission:  Basic Metabolic Panel:  Recent Labs Lab 12/02/14 0346 12/08/14 2115  NA 136 134*  K 3.6 3.9  CL 98* 101  CO2 30 25  GLUCOSE 88 98  BUN 10 9  CREATININE 0.83 0.77  CALCIUM 8.7* 9.0   Liver Function Tests:  Recent Labs Lab 12/08/14 2115  AST 33  ALT 40  ALKPHOS 47  BILITOT 1.1  PROT 5.6*  ALBUMIN 3.5    Recent Labs Lab 12/08/14 2115  LIPASE 26   No results for input(s): AMMONIA in the last 168 hours. CBC:  Recent Labs Lab 12/02/14 0346 12/08/14 2115  WBC 7.0 7.0  NEUTROABS  --  4.3  HGB 11.6* 11.9*  HCT 36.3 38.0  MCV 76.6* 79.3  PLT  254 237   Cardiac Enzymes: No results for input(s): CKTOTAL, CKMB, CKMBINDEX, TROPONINI in the last 168 hours.  BNP (last 3 results) No results for input(s): BNP in the last 8760 hours.  ProBNP (last 3 results) No results for input(s): PROBNP in the last 8760 hours.  CBG: No results for input(s): GLUCAP in the last 168 hours.  Radiological Exams on Admission: No results found.   Assessment/Plan Active Problems:   Anemia, iron deficiency   Seizure   Portal vein thrombosis   Abdominal pain   Generalized abdominal pain   1. Abdominal pain with recent colonoscopy showing ileal stricture - on-call surgeon Dr. Redmond Pulling has been comfortable. I have placed patient nothing by mouth and on pain medications and fluids. Further recommendations per surgery. See history of present illness. Plan is to have surgery once patient is at least 2 weeks off prednisone which patient was just off 3 days ago. And also 6 weeks from patient's diagnosis of portal vein thrombosis on 10/07/2014. 2. History of portal vein thrombosis and sagittal sinus thrombosis and history of CVA with patient being on anticoagulation - hematologist has recommended lifelong anticoagulation. Since patient is nothing by  mouth and also has had multiple episodes of nausea and vomiting at this time I have placed patient on heparin infusion. 3. History of seizures - I have changed to by mouth Keppra to IV Keppra and the patient intake oral reliably. 4. Chronic anemia - follow CBC. 5. Chronic pain on methadone.  I have reviewed patient's old charts and labs. Personally reviewed patient's x-ray.   DVT Prophylaxis heparin infusion.  Code Status: Full code.  Family Communication: Discussed with patient.  Disposition Plan: Admit to inpatient.    KAKRAKANDY,ARSHAD N. [facility] Pager [phone].  If 7PM-7AM, please contact night-coverage [email or web address] Password [email or web address] 12/09/2014, 2:10 AM

## 2014-12-09 NOTE — ED Notes (Signed)
Pt now pain free but states "I feel weird" after ketamine administration

## 2014-12-09 NOTE — Progress Notes (Signed)
ANTICOAGULATION CONSULT NOTE - Follow Up Consult  Pharmacy Consult for Heparin (while apixaban on hold) Indication: hx portal vein thrombus, CVA  Allergies  Allergen Reactions  . Ativan [Lorazepam] Other (See Comments)    " I don't remember anything."  . Bee Venom Swelling  . Ciprofloxacin Diarrhea and Nausea And Vomiting    Patient Measurements: Height: 5\' 2"  (157.5 cm) Weight: 202 lb 14.4 oz (92.035 kg) IBW/kg (Calculated) : 50.1 Heparin Dosing Weight: 71.4 kg  Vital Signs: Temp: 98.5 F (36.9 C) (09/30 0856) Temp Source: Oral (09/30 0856) BP: 116/64 mmHg (09/30 0856) Pulse Rate: 69 (09/30 0856)  Labs:  Recent Labs  12/08/14 2115 12/09/14 0412 12/09/14 1004 12/09/14 1611  HGB 11.9* 9.8*  --   --   HCT 38.0 31.9*  --   --   PLT 237 203  --   --   APTT  --   --  74* 65*  HEPARINUNFRC  --   --  0.75* 0.55  CREATININE 0.77 0.81  --   --     Estimated Creatinine Clearance: 112.1 mL/min (by C-G formula based on Cr of 0.81).   Medications:  Scheduled:  . FLUoxetine  20 mg Oral Daily  . levETIRAcetam  500 mg Intravenous BID  . methadone  85 mg Oral Q breakfast  . polyethylene glycol  17 g Oral BID  . sorbitol, milk of mag, mineral oil, glycerin (SMOG) enema  960 mL Rectal Once   Infusions:  . dextrose 5 % and 0.9% NaCl 100 mL/hr at 12/09/14 0456  . heparin 1,000 Units/hr (12/09/14 0316)    Assessment: 24 yo F admitted with worsening abd pain, N/V. Pt has chronic pelvic/abd pain and is on methadone for this. CT 9/15 showed ileitis. Colonoscopy 9/9 showed non-obs stricture. Her discomfort is out of proportion to her physical findings. MD suggesting tx or referral to tertiary care center.  Apixaban PTA for sinus thrombosis/portal vein thrombosis, last dose 9/29 AM. To bridge to heparin pending needs for procedures.   Confirmatory heparin level = 0.55, aPTT 65, low-end goal range. No issue with lines, not interruption with in fusion, no bleeding noted per  RN.    Goal of Therapy:  Heparin level 0.3-0.7 units/ml aPTT 66-102 seconds Monitor platelets by anticoagulation protocol: Yes   Plan:  Continue heaprin at 1000 units/hr F/u AM Heparin level and aPTT   Bayard Hugger, PharmD, BCPS  Clinical Pharmacist  Pager: [phone]   12/09/2014 4:51 PM

## 2014-12-09 NOTE — Progress Notes (Signed)
Pt. Was ordered a smog enema. Pt refused throughout the day. When pt was ready to receive the smog enema at 1900, pt only held in enema for 2 minutes receiving 150 ml. Pt has refused the rest of solution.   Angus Seller

## 2014-12-09 NOTE — Progress Notes (Signed)
[facility]      [address] [place], [place] [address]    Phone: [phone] FAX: [phone]     Subjective: The patient returned to the hospital with worsening abdominal pain nausea and vomiting.  She has been eating a soft diet.  Stopped prednisone on 9/26.  Denies fevers or chills.  Had a bowel movement yesterday afternoon.  The patient has a normal white count, lactic acid, renal function and electrolytes.  Last CT on 9/15 showed ileitis.  Had a colonoscopy on 9/9 which showed a stricture, non obstructive.  She has chronic pelvic/abdominal pain and takes methadone for this.  Furthermore, a unclear coagulopathic problem.  At last admission, we recommended OP Referral to [facility], which has not been pursued.   Objective:  Vital signs:  Filed Vitals:   12/09/14 0045 12/09/14 0126 12/09/14 0530 12/09/14 0856  BP: 102/57 125/72 120/74 116/64  Pulse: 71 73 74 69  Temp:  98.6 F (37 C) 98.1 F (36.7 C) 98.5 F (36.9 C)  TempSrc:  Oral Oral Oral  Resp: _0 Height:  5' 2" (1.575 m)    Weight:  92.035 kg (202 lb 14.4 oz)    SpO2: 99% 100% 100% 100%       Intake/Output   Yesterday:  09/29 0701 - 09/30 0700 In: 0  Out: 250 [Urine:250] This shift:    I/O last 3 completed shifts: In: 0  Out: 250 [Urine:250]   Physical Exam: General: Pt awake/alert/oriented x4 in no  acute distress  Abdomen: Soft.  Nondistended.  Generalized tenderness without guarding.   No evidence of peritonitis.  No incarcerated hernias.    Problem List:   Active Problems:   Anemia, iron deficiency   Seizure   Portal vein thrombosis   Abdominal pain   Generalized abdominal pain    Results:   Labs: Results for orders placed or performed during the hospital encounter of 12/08/14 (from the past 48 hour(s))  CBC with Differential     Status: Abnormal   Collection Time: 12/08/14  9:15 PM  Result Value Ref Range   WBC 7.0 4.0 -  10.5 K/uL   RBC 4.79 3.87 - 5.11 MIL/uL   Hemoglobin 11.9 (L) 12.0 - 15.0 g/dL   HCT 38.0 36.0 - 46.0 %   MCV 79.3 78.0 - 100.0 fL   MCH 24.8 (L) 26.0 - 34.0 pg   MCHC 31.3 30.0 - 36.0 g/dL   RDW 24.9 (H) 11.5 - 15.5 %   Platelets 237 150 - 400 K/uL   Neutrophils Relative % 63 %   Lymphocytes Relative 31 %   Monocytes Relative 5 %   Eosinophils Relative 1 %   Basophils Relative 0 %   Neutro Abs 4.3 1.7 - 7.7 K/uL   Lymphs Abs 2.2 0.7 - 4.0 K/uL   Monocytes Absolute 0.4 0.1 - 1.0 K/uL   Eosinophils Absolute 0.1 0.0 - 0.7 K/uL   Basophils Absolute 0.0 0.0 - 0.1 K/uL   Smear Review WITHIN NORMAL LIMITS   Comprehensive metabolic panel     Status: Abnormal   Collection Time: 12/08/14  9:15 PM  Result Value Ref Range   Sodium 134 (L) 135 - 145 mmol/L   Potassium 3.9 3.5 - 5.1 mmol/L   Chloride 101 101 - 111 mmol/L   CO2 25 22 - 32 mmol/L   Glucose, Bld 98 65 - 99 mg/dL  BUN 9 6 - 20 mg/dL   Creatinine, Ser 0.77 0.44 - 1.00 mg/dL   Calcium 9.0 8.9 - 10.3 mg/dL   Total Protein 5.6 (L) 6.5 - 8.1 g/dL   Albumin 3.5 3.5 - 5.0 g/dL   AST 33 15 - 41 U/L   ALT 40 14 - 54 U/L   Alkaline Phosphatase 47 38 - 126 U/L   Total Bilirubin 1.1 0.3 - 1.2 mg/dL   GFR calc non Af Amer >60 >60 mL/min   GFR calc Af Amer >60 >60 mL/min    Comment: (NOTE) The eGFR has been calculated using the CKD EPI equation. This calculation has not been validated in all clinical situations. eGFR's persistently <60 mL/min signify possible Chronic Kidney Disease.    Anion gap 8 5 - 15  Lipase, blood     Status: None   Collection Time: 12/08/14  9:15 PM  Result Value Ref Range   Lipase 26 22 - 51 U/L  I-Stat CG4 Lactic Acid, ED     Status: None   Collection Time: 12/08/14 10:25 PM  Result Value Ref Range   Lactic Acid, Venous 1.36 0.5 - 2.0 mmol/L  Urinalysis, Routine w reflex microscopic (not at [facility])     Status: Abnormal   Collection Time: 12/08/14 11:50 PM  Result Value Ref Range   Color, Urine YELLOW  YELLOW   APPearance CLOUDY (A) CLEAR   Specific Gravity, Urine 1.009 1.005 - 1.030   pH 5.5 5.0 - 8.0   Glucose, UA NEGATIVE NEGATIVE mg/dL   Hgb urine dipstick NEGATIVE NEGATIVE   Bilirubin Urine NEGATIVE NEGATIVE   Ketones, ur NEGATIVE NEGATIVE mg/dL   Protein, ur NEGATIVE NEGATIVE mg/dL   Urobilinogen, UA 1.0 0.0 - 1.0 mg/dL   Nitrite NEGATIVE NEGATIVE   Leukocytes, UA NEGATIVE NEGATIVE    Comment: MICROSCOPIC NOT DONE ON URINES WITH NEGATIVE PROTEIN, BLOOD, LEUKOCYTES, NITRITE, OR GLUCOSE <1000 mg/dL.  Pregnancy, urine     Status: None   Collection Time: 12/08/14 11:50 PM  Result Value Ref Range   Preg Test, Ur NEGATIVE NEGATIVE    Comment:        THE SENSITIVITY OF THIS METHODOLOGY IS >20 mIU/mL.   Comprehensive metabolic panel     Status: Abnormal   Collection Time: 12/09/14  4:12 AM  Result Value Ref Range   Sodium 142 135 - 145 mmol/L    Comment: DELTA CHECK NOTED   Potassium 4.1 3.5 - 5.1 mmol/L   Chloride 109 101 - 111 mmol/L   CO2 25 22 - 32 mmol/L   Glucose, Bld 96 65 - 99 mg/dL   BUN 6 6 - 20 mg/dL   Creatinine, Ser 0.81 0.44 - 1.00 mg/dL   Calcium 8.7 (L) 8.9 - 10.3 mg/dL   Total Protein 4.7 (L) 6.5 - 8.1 g/dL   Albumin 3.0 (L) 3.5 - 5.0 g/dL   AST 25 15 - 41 U/L   ALT 33 14 - 54 U/L   Alkaline Phosphatase 42 38 - 126 U/L   Total Bilirubin 0.8 0.3 - 1.2 mg/dL   GFR calc non Af Amer >60 >60 mL/min   GFR calc Af Amer >60 >60 mL/min    Comment: (NOTE) The eGFR has been calculated using the CKD EPI equation. This calculation has not been validated in all clinical situations. eGFR's persistently <60 mL/min signify possible Chronic Kidney Disease.    Anion gap 8 5 - 15  CBC WITH DIFFERENTIAL     Status: Abnormal  Collection Time: 12/09/14  4:12 AM  Result Value Ref Range   WBC 4.9 4.0 - 10.5 K/uL   RBC 4.03 3.87 - 5.11 MIL/uL   Hemoglobin 9.8 (L) 12.0 - 15.0 g/dL   HCT 31.9 (L) 36.0 - 46.0 %   MCV 79.2 78.0 - 100.0 fL   MCH 24.3 (L) 26.0 - 34.0 pg    MCHC 30.7 30.0 - 36.0 g/dL   RDW 25.6 (H) 11.5 - 15.5 %   Platelets 203 150 - 400 K/uL   Neutrophils Relative % 50 %   Lymphocytes Relative 43 %   Monocytes Relative 6 %   Eosinophils Relative 1 %   Basophils Relative 0 %   Neutro Abs 2.4 1.7 - 7.7 K/uL   Lymphs Abs 2.1 0.7 - 4.0 K/uL   Monocytes Absolute 0.3 0.1 - 1.0 K/uL   Eosinophils Absolute 0.1 0.0 - 0.7 K/uL   Basophils Absolute 0.0 0.0 - 0.1 K/uL   WBC Morphology ATYPICAL LYMPHOCYTES     Imaging / Studies: No results found.  Medications / Allergies:  Scheduled Meds: . FLUoxetine  20 mg Oral Daily  . levETIRAcetam  500 mg Intravenous BID  . methadone  85 mg Oral Q breakfast   Continuous Infusions: . dextrose 5 % and 0.9% NaCl 100 mL/hr at 12/09/14 0456  . heparin 1,000 Units/hr (12/09/14 0316)   PRN Meds:.acetaminophen **OR** acetaminophen, HYDROmorphone (DILAUDID) injection, ondansetron **OR** ondansetron (ZOFRAN) IV  Antibiotics: Anti-infectives    None        Assessment/Plan Portal vein thrombosis Chronic pain syndrome Depression Constipation Seizure disorder Morbid obesity Nicotine abuse  Abdominal pain with known stricture and ileitis  recent biopsy was non diagnostic, ischemic v inflammatory bowel disease.  The patient is not obstructed from the stricture and is non toxic.  She is afebrile with a normal white count.  No lactic acidosis.  No acute abdomen on exam.  She will need surgery at some point. I think her problem is and surgical needs are beyond our scope of care.  We recommend referral or transfer to a tertiary care facility.    Erby Pian, [facility] Pager [phone]) For consults and floor pages call [phone])  12/09/2014

## 2014-12-09 NOTE — Progress Notes (Addendum)
ANTICOAGULATION CONSULT NOTE - Follow Up Consult  Pharmacy Consult for Heparin (while apixaban on hold) Indication: hx portal vein thrombus, CVA  Allergies  Allergen Reactions  . Ativan [Lorazepam] Other (See Comments)    " I don't remember anything."  . Bee Venom Swelling  . Ciprofloxacin Diarrhea and Nausea And Vomiting    Patient Measurements: Height:  (157.5 cm) Weight: 202 lb 14.4 oz (92.035 kg) IBW/kg (Calculated) : 50.1 Heparin Dosing Weight: 71.4 kg  Vital Signs: Temp: 98.5 F (36.9 C) (09/30 0856) Temp Source: Oral (09/30 0856) BP: 116/64 mmHg (09/30 0856) Pulse Rate: 69 (09/30 0856)  Labs:  Recent Labs  12/08/14 2115 12/09/14 0412 12/09/14 1004  HGB 11.9* 9.8*  --   HCT 38.0 31.9*  --   PLT 237 203  --   APTT  --   --  74*  HEPARINUNFRC  --   --  0.75*  CREATININE 0.77 0.81  --     Estimated Creatinine Clearance: 112.1 mL/min (by C-G formula based on Cr of 0.81).   Medications:  Scheduled:  . FLUoxetine  20 mg Oral Daily  . levETIRAcetam  500 mg Intravenous BID  . methadone  85 mg Oral Q breakfast   Infusions:  . dextrose 5 % and 0.9% NaCl 100 mL/hr at 12/09/14 0456  . heparin 1,000 Units/hr (12/09/14 0316)    Assessment: 24 yo F admitted with worsening abd pain, N/V. Pt has chronic pelvic/abd pain and is on methadone for this. CT 9/15 showed ileitis. Colonoscopy 9/9 showed non-obs stricture. Her discomfort is out of proportion to her physical findings. MD suggesting tx or referral to tertiary care center.  Apixaban PTA for sinus thrombosis/portal vein thrombosis, last dose 9/29 AM. To bridge to heparin pending needs for procedures.   HL=0.75, aPTT 74 on 1000 units/hr - Heparin level is slightly elevated, likely related to apixaban but aPTT is therapeutic.  Will recheck in 6 hours for confirmation.  Goal of Therapy:  Heparin level 0.3-0.7 units/ml aPTT 66-102 seconds Monitor platelets by anticoagulation protocol: Yes   Plan:   Continue heaprin at 1000 units/hr Heparin level and aPTT at 1600 for confirmation  [employer], Pharm.D., BCPS Clinical Pharmacist Pager [phone] 12/09/2014 11:34 AM

## 2014-12-09 NOTE — ED Notes (Signed)
Called pharmacy and spoke with Dr. Joyice Faster in regards to ketamine. Currently ordered for pain management, dose low enough as not to cause sedation. Currently being monitored by telemetry monitor, bp and pulse continuously. May push rapid, or over 2 minutes.

## 2014-12-09 NOTE — Progress Notes (Signed)
ANTICOAGULATION CONSULT NOTE - Initial Consult  Pharmacy Consult for Heparin (holding apixaban) Indication: Hx sinus thrombosis, portal vein thrombosis  Allergies  Allergen Reactions  . Ativan [Lorazepam] Other (See Comments)    " I don't remember anything."  . Bee Venom Swelling  . Ciprofloxacin Diarrhea and Nausea And Vomiting    Patient Measurements: Height: 5' 2"  (157.5 cm) Weight: 202 lb 14.4 oz (92.035 kg) IBW/kg (Calculated) : 50.1 H Vital Signs: Temp: 98.6 F (37 C) (09/30 0126) Temp Source: Oral (09/30 0126) BP: 125/72 mmHg (09/30 0126) Pulse Rate: 73 (09/30 0126)  Labs:  Recent Labs  12/08/14 2115  HGB 11.9*  HCT 38.0  PLT 237  CREATININE 0.77    Estimated Creatinine Clearance: 113.5 mL/min (by C-G formula based on Cr of 0.77).   Medical History: Past Medical History  Diagnosis Date  . Interstitial cystitis     pain from this was reason for starting opioids age 24.   . Chronic pelvic pain in female   . Headache(784.0)   . Pyelonephritis affecting pregnancy in first trimester July 2015  . Depression 2007  . Opioid dependence     to Rx opioids.  switched to Methadone after second child born in 04/2014  . Pneumonia 03/2014  . Seizures   . Stroke 05/2014    presented with right sided weakness.   . Numbness     right side  . Dizziness 07/18/14  . Confusion   . 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 Preeclampsia 2012  . Miscarriage 2005  . Non-compliance     with anticoagulant  . 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 Cerebral venous sinus thrombosis     Assessment: Apixaban PTA for sinus thrombosis/portal vein thrombosis, last dose 9/29 AM, Hgb 11.9, other labs/meds reviewed, will dose heparin using aPTT for now and starting using anti-Xa levels after they correlate due to apixaban influence on anti-Xa levels.   Goal of Therapy:  Heparin level 0.3-0.7 units/ml aPTT 66-102 seconds Monitor platelets by anticoagulation protocol: Yes   Plan:  -Start  heparin at 1000 units/hr -1000 aPTT/HL -Daily CBC/aPTT/HL -Monitor for bleeding  [name] 12/09/2014,2:17 AM

## 2014-12-10 ENCOUNTER — Inpatient Hospital Stay (HOSPITAL_COMMUNITY): Payer: Medicaid Other

## 2014-12-10 DIAGNOSIS — R1031 Right lower quadrant pain: Secondary | ICD-10-CM

## 2014-12-10 LAB — GLUCOSE, CAPILLARY
Glucose-Capillary: 110 mg/dL — ABNORMAL HIGH (ref 65–99)
Glucose-Capillary: 88 mg/dL (ref 65–99)
Glucose-Capillary: 95 mg/dL (ref 65–99)

## 2014-12-10 LAB — CBC
HCT: 32 % — ABNORMAL LOW (ref 36.0–46.0)
Hemoglobin: 9.9 g/dL — ABNORMAL LOW (ref 12.0–15.0)
MCH: 24.6 pg — ABNORMAL LOW (ref 26.0–34.0)
MCHC: 30.9 g/dL (ref 30.0–36.0)
MCV: 79.4 fL (ref 78.0–100.0)
Platelets: 187 10*3/uL (ref 150–400)
RBC: 4.03 MIL/uL (ref 3.87–5.11)
RDW: 25.1 % — ABNORMAL HIGH (ref 11.5–15.5)
WBC: 4.2 10*3/uL (ref 4.0–10.5)

## 2014-12-10 LAB — SEDIMENTATION RATE: Sed Rate: 4 mm/hr (ref 0–22)

## 2014-12-10 LAB — HEPARIN LEVEL (UNFRACTIONATED): Heparin Unfractionated: 0.34 IU/mL (ref 0.30–0.70)

## 2014-12-10 LAB — APTT: aPTT: 60 seconds — ABNORMAL HIGH (ref 24–37)

## 2014-12-10 LAB — HCG, QUANTITATIVE, PREGNANCY: hCG, Beta Chain, Quant, S: <1 m[IU]/mL (ref <5)

## 2014-12-10 LAB — C-REACTIVE PROTEIN: CRP: <0.5 mg/dL (ref <1.0)

## 2014-12-10 MED ORDER — IOHEXOL 300 MG/ML  SOLN
25.0000 mL | INTRAMUSCULAR | Status: AC
  Administered 2014-12-10: 25 mL via ORAL

## 2014-12-10 MED ORDER — LEVETIRACETAM 500 MG PO TABS
500.0000 mg | ORAL_TABLET | Freq: Two times a day (BID) | ORAL | Status: DC
  Administered 2014-12-10 – 2014-12-19 (×18): 500 mg via ORAL
  Filled 2014-12-10 (×18): qty 1

## 2014-12-10 MED ORDER — IOHEXOL 300 MG/ML  SOLN
100.0000 mL | Freq: Once | INTRAMUSCULAR | Status: AC | PRN
  Administered 2014-12-10: 100 mL via INTRAVENOUS

## 2014-12-10 NOTE — Progress Notes (Signed)
Victoria Alvarez HUD:[id number] DOB: [birth year] DOA: 12/08/2014 PCP: Minerva Ends, MD  Brief narrative: 24 y.o. 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 Ms. Justiss has been hospitalized 3x recently for similar symptoms.  She was feeling better after discharge 8/27 but still having cramping pain that was relieved with bowel movements.  9/4 she began having continuous abdominal pain not relieved with bowel movements. The pain is located thru out her abdomen. It stops her from eating. She was seen by GI at last admission and underwent colonoscopy with distal ileal stricture Gen surgery evaluated for possible surgery It was felt patient may benefit the most from being at a tertiary care facility and I called [facility] on last admission  Returned to [facility] with a  Few days n/v and abd pain without fever or chills  Past medical history-As per Problem list Chart reviewed as below-   Consultants: Gi  Surgery  Procedures:  CT pedning  Antibiotics:   none   Subjective  In pain"its horrible and the worst pain ever" No f,chill,n,cv,cp No vag bleed, odour, discharge   Objective    Interim History:   Telemetry:    Objective: Filed Vitals:   12/09/14 0856 12/09/14 1807 12/09/14 2032 12/10/14 0513  BP: 116/64 115/68 98/51 90/47   Pulse: 69 78 75 63  Temp: 98.5 F (36.9 C) 98.4 F (36.9 C) 98.4 F (36.9 C) 98 F (36.7 C)  TempSrc: Oral Oral Oral Oral  Resp: 16 18 24 18   Height:      Weight:   92.08 kg (203 lb)   SpO2: 100% 100% 94% 98%   No intake or output data in the 24 hours ending 12/10/14 1356  Exam:  General: eomi tearful, obese Cardiovascular: s1 s 2no m/r/g Respiratory: clear Abdomen: soft ovese no specific tenderness Skinno le edema Neuro  intact  Data Reviewed: Basic Metabolic Panel:  Recent Labs Lab 12/08/14 2115 12/09/14 0412  NA 134* 142  K 3.9 4.1  CL 101 109  CO2 25 25  GLUCOSE 98 96  BUN 9 6  CREATININE 0.77 0.81  CALCIUM 9.0 8.7*   Liver Function Tests:  Recent Labs Lab 12/08/14 2115 12/09/14 0412  AST 33 25  ALT 40 33  ALKPHOS 47 42  BILITOT 1.1 0.8  PROT 5.6* 4.7*  ALBUMIN 3.5 3.0*    Recent Labs Lab 12/08/14 2115  LIPASE 26   No results for input(s): AMMONIA in the last 168 hours. CBC:  Recent Labs Lab 12/08/14 2115 12/09/14 0412 12/10/14 0555  WBC 7.0 4.9 4.2  NEUTROABS 4.3 2.4  --   HGB 11.9* 9.8* 9.9*  HCT 38.0 31.9* 32.0*  MCV 79.3 79.2 79.4  PLT 237 203 187   Cardiac Enzymes: No results for input(s): CKTOTAL, CKMB, CKMBINDEX, TROPONINI in the last 168 hours. BNP: Invalid input(s): POCBNP CBG:  Recent Labs Lab 12/09/14 1140 12/09/14 1704 12/10/14 0006 12/10/14 0736 12/10/14 1129  GLUCAP 99 96 88 95 110*    No results found for this or any previous visit (from the past 240 hour(s)).   Studies:              All Imaging reviewed and is as per above notation   Scheduled Meds: . FLUoxetine  20 mg Oral Daily  . iohexol  25 mL Oral Q1 Hr x 2  . levETIRAcetam  500 mg Oral BID  . methadone  85 mg Oral Q breakfast  . polyethylene glycol  17 g Oral BID   Continuous Infusions: . heparin 1,100 Units/hr (12/10/14 1223)     Assessment/Plan: 1. abd pain-unclear etiology.  Further steps per GI and Gen surg.  Agree pain oop to exam.  bnursing states paitnet is fine being a little "late" on the dilaudid and seems to do well exceopt when given attention.  I called [facility] and the patient is on the waitlist.  For completion sake i will get a Pregnancy test. 2. Portal Venous thrombosis-no specific cause-Is on heparin now.  Transition back to oral AC when no procedures planned 3. Ho cerebral sinus thrmobisis-see above 4. Seizure disorder-keppra 500 bid 5. H/o Chr Pain with  interstitial cystitis-usually on methadone 85 daily.  continu this and IV dilaudid-attempt to transition to PO percocet in am 6. Hypercoag state-work-up done-defer to [facility] othe rthoughts  Code Status: full Family Communication:  None  Disposition Plan: unclear    Verneita Griffes, MD  [facility] Pager [phone] 12/10/2014, 1:56 PM    LOS: 1 day

## 2014-12-10 NOTE — Progress Notes (Signed)
    Progress Note   Subjective  *Complaining of severe RLQ pain this am.  Having bowel movements and passing gas.  No nausea or vomiting.**   Objective  Vital signs in last 24 hours: Temp:  [98 F (36.7 C)-98.4 F (36.9 C)] 98 F (36.7 C) (10/01 0513) Pulse Rate:  [63-78] 63 (10/01 0513) Resp:  [18-24] 18 (10/01 0513) BP: (90-115)/(47-68) 90/47 mmHg (10/01 0513) SpO2:  [94 %-100 %] 98 % (10/01 0513) Weight:  [203 lb (92.08 kg)] 203 lb (92.08 kg) (09/30 2032) Last BM Date: 12/09/14  General: Alert, well-developed,  in NAD Heart:  Regular rate and rhythm; no murmurs Chest: Clear to ascultation bilaterally Abdomen:  Soft,  and nondistended. Normal bowel sounds, without guarding, and without rebound.  Mild RLQ tenderness Extremities:  Without edema. Neurologic:  Alert and  oriented x4; grossly normal neurologically. Psych:  Alert and cooperative. Normal mood and affect.  Intake/Output from previous day:   Intake/Output this shift:    Lab Results:  Recent Labs  12/08/14 2115 12/09/14 0412 12/10/14 0555  WBC 7.0 4.9 4.2  HGB 11.9* 9.8* 9.9*  HCT 38.0 31.9* 32.0*  PLT 237 203 187   BMET  Recent Labs  12/08/14 2115 12/09/14 0412  NA 134* 142  K 3.9 4.1  CL 101 109  CO2 25 25  GLUCOSE 98 96  BUN 9 6  CREATININE 0.77 0.81  CALCIUM 9.0 8.7*   LFT  Recent Labs  12/09/14 0412  PROT 4.7*  ALBUMIN 3.0*  AST 25  ALT 33  ALKPHOS 42  BILITOT 0.8   PT/INR No results for input(s): LABPROT, INR in the last 72 hours. Hepatitis Panel No results for input(s): HEPBSAG, HCVAB, HEPAIGM, HEPBIGM in the last 72 hours.  Studies/Results: No results found.    Assessment & Plan  *Abdominal pain is way out of proportion to physical exam.  There's no evidence for obstruction.  In view of increased pain will repeat CT of abdomen. ** Active Problems:   Anemia, iron deficiency   Seizure   Portal vein thrombosis   Abdominal pain   Generalized abdominal pain     LOS: 1 day   Melvia Heaps  12/10/2014, 9:02 AM [phone] 8a-5p weekdays [phone] weekends, holidays and 5p-8a or per Amion

## 2014-12-10 NOTE — Progress Notes (Signed)
[facility] Progress Note    Subjective: Patient with history of chronic pain.  Now with severe pain and completely normal examination.  She has a normal WBC with no left shift.  Slightly anemia  Objective: Vital signs in last 24 hours: Temp:  [98 F (36.7 C)-98.4 F (36.9 C)] 98 F (36.7 C) (10/01 0513) Pulse Rate:  [63-78] 63 (10/01 0513) Resp:  [18-24] 18 (10/01 0513) BP: (90-115)/(47-68) 90/47 mmHg (10/01 0513) SpO2:  [94 %-100 %] 98 % (10/01 0513) Weight:  [92.08 kg (203 lb)] 92.08 kg (203 lb) (09/30 2032) Last BM Date: 12/09/14  Intake/Output from previous day:   Intake/Output this shift:    General: Cries out in pain with rolling from side to side.  Lungs: Clear  Abd: Soft, excellent bowel sounds.  Extremities: No DVT  Neuro: Intact  Lab Results:  @LABLAST2 (wbc:2,hgb:2,hct:2,plt:2) BMET ) Recent Labs  12/08/14 2115 12/09/14 0412  NA 134* 142  K 3.9 4.1  CL 101 109  CO2 25 25  GLUCOSE 98 96  BUN 9 6  CREATININE 0.77 0.81  CALCIUM 9.0 8.7*   PT/INR No results for input(s): LABPROT, INR in the last 72 hours. ABG No results for input(s): PHART, HCO3 in the last 72 hours.  Invalid input(s): PCO2, PO2  Studies/Results: No results found.  Anti-infectives: Anti-infectives    None      Assessment/Plan: s/p  There is no good reason for this patient to have this much pain.  CV angiogram was normal  Terminal ileitis would not give the patient this much peritoneal irritation that she is subjectively displaying.  We will not operate on this patient currently  She needs to go to a tertiary care center for further workup  LOS: 1 day   Kathryne Eriksson. Dahlia Bailiff, MD, FACS [phone] [facility] 12/10/2014

## 2014-12-10 NOTE — Progress Notes (Signed)
ANTICOAGULATION CONSULT NOTE - Follow Up Consult  Pharmacy Consult for Heparin (while apixaban on hold) Indication: hx portal vein thrombus, CVA  Allergies  Allergen Reactions  . Ativan [Lorazepam] Other (See Comments)    " I don't remember anything."  . Bee Venom Swelling  . Ciprofloxacin Diarrhea and Nausea And Vomiting    Patient Measurements: Height: 5\' 2"  (157.5 cm) Weight: 203 lb (92.08 kg) IBW/kg (Calculated) : 50.1 Heparin Dosing Weight: 71.4 kg  Vital Signs: Temp: 98 F (36.7 C) (10/01 0513) Temp Source: Oral (10/01 0513) BP: 90/47 mmHg (10/01 0513) Pulse Rate: 63 (10/01 0513)  Labs:  Recent Labs  12/08/14 2115 12/09/14 0412 12/09/14 1004 12/09/14 1611 12/10/14 0555  HGB 11.9* 9.8*  --   --  9.9*  HCT 38.0 31.9*  --   --  32.0*  PLT 237 203  --   --  187  APTT  --   --  74* 65* 60*  HEPARINUNFRC  --   --  0.75* 0.55 0.34  CREATININE 0.77 0.81  --   --   --     Estimated Creatinine Clearance: 112.1 mL/min (by C-G formula based on Cr of 0.81).   Medications:  Scheduled:  . FLUoxetine  20 mg Oral Daily  . levETIRAcetam  500 mg Intravenous BID  . methadone  85 mg Oral Q breakfast  . polyethylene glycol  17 g Oral BID   Infusions:  . heparin 1,000 Units/hr (12/10/14 0039)    Assessment: 24 yo F admitted with worsening abd pain, N/V. Pt has chronic pelvic/abd pain and is on methadone for this. CT 9/15 showed ileitis. Colonoscopy 9/9 showed non-obs stricture. Her discomfort is out of proportion to her physical findings. MD suggesting tx or referral to tertiary care center.  Apixaban PTA for sinus thrombosis/portal vein thrombosis, last dose 9/29 AM. To bridge to heparin pending needs for procedures.   HL=0.34, aPTT 60 on 1000 units/hr - Heparin level and aPTT starting to corollate.  Both at low end of goal.  Will increase infusion rate and use heparin levels only for monitoring.    Goal of Therapy:  Heparin level 0.3-0.7 units/ml Monitor  platelets by anticoagulation protocol: Yes   Plan:  Increase heaprin to 1100 units/hr Heparin level and CBC daily while on heparin Follow-up transition back to Apixaban as no plans for surgery  Noted pt tolerating PO Methadone.  Will change Keppra to PO as well per IV>PO policy.  [employer], Pharm.D., BCPS Clinical Pharmacist Pager [phone] 12/10/2014 11:41 AM

## 2014-12-11 DIAGNOSIS — R109 Unspecified abdominal pain: Secondary | ICD-10-CM

## 2014-12-11 LAB — CBC
HCT: 33.9 % — ABNORMAL LOW (ref 36.0–46.0)
Hemoglobin: 10.8 g/dL — ABNORMAL LOW (ref 12.0–15.0)
MCH: 25.1 pg — ABNORMAL LOW (ref 26.0–34.0)
MCHC: 31.9 g/dL (ref 30.0–36.0)
MCV: 78.7 fL (ref 78.0–100.0)
Platelets: 175 10*3/uL (ref 150–400)
RBC: 4.31 MIL/uL (ref 3.87–5.11)
RDW: 24.5 % — ABNORMAL HIGH (ref 11.5–15.5)
WBC: 4 10*3/uL (ref 4.0–10.5)

## 2014-12-11 LAB — GLUCOSE, CAPILLARY
Glucose-Capillary: 100 mg/dL — ABNORMAL HIGH (ref 65–99)
Glucose-Capillary: 104 mg/dL — ABNORMAL HIGH (ref 65–99)
Glucose-Capillary: 127 mg/dL — ABNORMAL HIGH (ref 65–99)
Glucose-Capillary: 79 mg/dL (ref 65–99)

## 2014-12-11 LAB — HEPARIN LEVEL (UNFRACTIONATED): Heparin Unfractionated: 0.48 IU/mL (ref 0.30–0.70)

## 2014-12-11 NOTE — Progress Notes (Signed)
Pt states that she is concerned about the pain she is having and that it is not "letting off". Also, she said she was having "bad diarrhea" that had a "odd green color" and wanted to make sure I let someone know. MD notified. Orders receive of clear liquid diet and pt is to stay on floor.   Karsten Ro

## 2014-12-11 NOTE — Progress Notes (Signed)
Victoria Alvarez LMB:[id number] DOB: [birth year] DOA: 12/08/2014 PCP: Victoria Ends, MD  Brief narrative: 24 y.o. 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 Victoria Alvarez has been hospitalized 3x recently for similar symptoms.  She was feeling better after discharge 8/27 but still having cramping pain that was relieved with bowel movements.  9/4 she began having continuous abdominal pain not relieved with bowel movements. The pain is located thru out her abdomen. It stops her from eating. She was seen by GI at last admission and underwent colonoscopy with distal ileal stricture Gen surgery evaluated for possible surgery It was felt patient may benefit the most from being at a tertiary care facility and I called [facility] on last admission  Returned to [facility] with a  Few days n/v and abd pain without fever or chills  Past medical history-As per Problem list Chart reviewed as below-   Consultants: Gi  Surgery  Procedures:  CT pedning  Antibiotics:   none   Subjective   In pain still Tolerating some diet however No n/v nor diarr reported No fever   Objective    Interim History:   Telemetry:    Objective: Filed Vitals:   12/10/14 1812 12/10/14 2044 12/11/14 0520 12/11/14 0735  BP: 97/45 86/60 98/47  108/46  Pulse: 65 67 64 59  Temp: 98.1 F (36.7 C) 97.8 F (36.6 C) 98.7 F (37.1 C) 98.5 F (36.9 C)  TempSrc: Oral Oral Oral Oral  Resp: 18 16 16 18   Height:      Weight:  92.08 kg (203 lb)    SpO2: 98% 98% 97% 96%    Intake/Output Summary (Last 24 hours) at 12/11/14 1041 Last data filed at 12/11/14 0900  Gross per 24 hour  Intake    360 ml  Output   2250 ml  Net  -1890 ml    Exam:  General: eomi  obese Cardiovascular: s1 s 2no m/r/g Respiratory:  clear Abdomen: soft ovese no specific tenderness but some lower quadrant dicomofrt-pain clearly OOP to exam Skinno le edema Neuro intact  Data Reviewed: Basic Metabolic Panel:  Recent Labs Lab 12/08/14 2115 12/09/14 0412  NA 134* 142  K 3.9 4.1  CL 101 109  CO2 25 25  GLUCOSE 98 96  BUN 9 6  CREATININE 0.77 0.81  CALCIUM 9.0 8.7*   Liver Function Tests:  Recent Labs Lab 12/08/14 2115 12/09/14 0412  AST 33 25  ALT 40 33  ALKPHOS 47 42  BILITOT 1.1 0.8  PROT 5.6* 4.7*  ALBUMIN 3.5 3.0*    Recent Labs Lab 12/08/14 2115  LIPASE 26   No results for input(s): AMMONIA in the last 168 hours. CBC:  Recent Labs Lab 12/08/14 2115 12/09/14 0412 12/10/14 0555 12/11/14 0343  WBC 7.0 4.9 4.2 4.0  NEUTROABS 4.3 2.4  --   --   HGB 11.9* 9.8* 9.9* 10.8*  HCT 38.0 31.9* 32.0* 33.9*  MCV 79.3 79.2 79.4 78.7  PLT 237 203 187 175   Cardiac Enzymes: No results for input(s): CKTOTAL, CKMB, CKMBINDEX, TROPONINI in the last 168 hours. BNP: Invalid input(s): POCBNP CBG:  Recent Labs Lab 12/10/14 0006 12/10/14 0736 12/10/14 1129 12/11/14 0010 12/11/14 0517  GLUCAP 88 95 110* 104* 79    No results found for this or any previous visit (from the past 240 hour(s)).   Studies:  All Imaging reviewed and is as per above notation   Scheduled Meds: . FLUoxetine  20 mg Oral Daily  . levETIRAcetam  500 mg Oral BID  . methadone  85 mg Oral Q breakfast  . polyethylene glycol  17 g Oral BID   Continuous Infusions: . heparin 1,100 Units/hr (12/10/14 2216)     Assessment/Plan: 1. abd pain-unclear etiology.  Further steps per GI and Gen surg.  Agree pain oop to exam.  No further idea what this could be.  Entertaining Insterstitial cystitis but patient states this is nothing like that constant dull pain-IV pain meds for now 2. Portal Venous thrombosis-no specific cause-Is on heparin now.  Transition back to oral AC when no procedures planned 3. Ho cerebral  sinus thrmobisis-see above 4. Seizure disorder-keppra 500 bid 5. H/o Chr Pain with interstitial cystitis-usually on methadone 85 daily.  continu this and IV dilaudid-attempt to transition to PO percocet in am 6. Hypercoag state-work-up done-defer to [facility] other thoughts  Code Status: full Family Communication:  None  Disposition Plan: unclear    Verneita Griffes, MD  [facility] Pager [phone] 12/11/2014, 10:41 AM    LOS: 2 days

## 2014-12-11 NOTE — Progress Notes (Signed)
Progress Note   Subjective  **Continues to c/o severe abdominal pain.  Having nonbloody bms.  Wants to eat*   Objective  Vital signs in last 24 hours: Temp:  [97.8 F (36.6 C)-98.7 F (37.1 C)] 98.7 F (37.1 C) (10/02 0520) Pulse Rate:  [64-67] 64 (10/02 0520) Resp:  [16-18] 16 (10/02 0520) BP: (86-98)/(45-60) 98/47 mmHg (10/02 0520) SpO2:  [97 %-98 %] 97 % (10/02 0520) Weight:  [203 lb (92.08 kg)] 203 lb (92.08 kg) (10/01 2044) Last BM Date: 12/09/14  General: Alert, well-developed,  in NAD Heart:  Regular rate and rhythm; no murmurs Chest: Clear to ascultation bilaterally Abdomen:  Soft,  and nondistended. Normal bowel sounds, without guarding, and without rebound.  Mild diffuse tenderness Extremities:  Without edema. Neurologic:  Alert and  oriented x4; grossly normal neurologically. Psych:  Alert and cooperative. Normal mood and affect.  Intake/Output from previous day: 10/01 0701 - 10/02 0700 In: 0  Out: 1600 [Urine:1600] Intake/Output this shift:    Lab Results:  Recent Labs  12/09/14 0412 12/10/14 0555 12/11/14 0343  WBC 4.9 4.2 4.0  HGB 9.8* 9.9* 10.8*  HCT 31.9* 32.0* 33.9*  PLT 203 187 175   BMET  Recent Labs  12/08/14 2115 12/09/14 0412  NA 134* 142  K 3.9 4.1  CL 101 109  CO2 25 25  GLUCOSE 98 96  BUN 9 6  CREATININE 0.77 0.81  CALCIUM 9.0 8.7*   LFT  Recent Labs  12/09/14 0412  PROT 4.7*  ALBUMIN 3.0*  AST 25  ALT 33  ALKPHOS 42  BILITOT 0.8   PT/INR No results for input(s): LABPROT, INR in the last 72 hours. Hepatitis Panel No results for input(s): HEPBSAG, HCVAB, HEPAIGM, HEPBIGM in the last 72 hours.  Studies/Results: Ct Abdomen Pelvis W Contrast  12/10/2014   CLINICAL DATA:  24 year old female with acute abdominal pelvic pain for 2 days.  EXAM: CT ABDOMEN AND PELVIS WITH CONTRAST  TECHNIQUE: Multidetector CT imaging of the abdomen and pelvis was performed using the standard protocol following bolus administration of  intravenous contrast.  CONTRAST:  OMNIPAQUE IOHEXOL 300 MG/ML  SOLN  COMPARISON:  11/24/2014 and prior studies  FINDINGS: Lower chest:  Unremarkable  Hepatobiliary: The liver and gallbladder are unremarkable. There is no evidence of biliary dilatation.  Pancreas: Unremarkable  Spleen: Unremarkable  Adrenals/Urinary Tract: The kidneys, adrenal glands and bladder are unremarkable.  Stomach/Bowel: There is circumferential wall thickening of a segment of small bowel/ ileum in the right abdomen with adjacent inflammation (15 cm in length) compatible with enteritis and may represent Crohn's disease. The terminal ileum however appears normal. There is no evidence of bowel obstruction, pneumoperitoneum or abscess. Mildly enlarged mesenteric lymph nodes in the right abdomen are identified.  Vascular/Lymphatic: No enlarged lymph nodes or abdominal aortic aneurysm.  Reproductive: The uterus and adnexal regions are unremarkable.  Other: No free fluid  Musculoskeletal: No acute or suspicious abnormality.  IMPRESSION: Circumferential wall thickening of a loop of small bowel/ ileum in the right abdomen with adjacent inflammation (15 cm in length). This most likely represents an enteritis which would include Crohn's disease, but the terminal ileum appears normal.   Electronically Signed   By: Harmon Pier M.D.   On: 12/10/2014 17:43      Assessment & Plan  *Continues to c/o severe pain without objective signs including normal WBC.  CT did not demonstrate any new findings.  Inflammatory markers are pending.  Plan to refeed pt.  Will have to address pain management with her. ** Active Problems:   Anemia, iron deficiency   Seizure (HCC)   Portal vein thrombosis   Abdominal pain   Generalized abdominal pain     LOS: 2 days   Melvia Heaps  12/11/2014, 8:19 AM [phone] 8a-5p weekdays [phone] weekends, holidays and 5p-8a or per Amion

## 2014-12-11 NOTE — Progress Notes (Signed)
ANTICOAGULATION CONSULT NOTE - Follow Up Consult  Pharmacy Consult for Heparin (while apixaban on hold) Indication: hx portal vein thrombus, CVA  Allergies  Allergen Reactions  . Ativan [Lorazepam] Other (See Comments)    " I don't remember anything."  . Bee Venom Swelling  . Ciprofloxacin Diarrhea and Nausea And Vomiting    Patient Measurements: Height: 5\' 2"  (157.5 cm) Weight: 203 lb (92.08 kg) IBW/kg (Calculated) : 50.1 Heparin Dosing Weight: 71.4 kg  Vital Signs: Temp: 98.5 F (36.9 C) (10/02 0735) Temp Source: Oral (10/02 0735) BP: 108/46 mmHg (10/02 0735) Pulse Rate: 59 (10/02 0735)  Labs:  Recent Labs  12/08/14 2115 12/09/14 0412  12/09/14 1004 12/09/14 1611 12/10/14 0555 12/11/14 0343  HGB 11.9* 9.8*  --   --   --  9.9* 10.8*  HCT 38.0 31.9*  --   --   --  32.0* 33.9*  PLT 237 203  --   --   --  187 175  APTT  --   --   --  74* 65* 60*  --   HEPARINUNFRC  --   --   < > 0.75* 0.55 0.34 0.48  CREATININE 0.77 0.81  --   --   --   --   --   < > = values in this interval not displayed.  Estimated Creatinine Clearance: 112.1 mL/min (by C-G formula based on Cr of 0.81).   Medications:  Scheduled:  . FLUoxetine  20 mg Oral Daily  . levETIRAcetam  500 mg Oral BID  . methadone  85 mg Oral Q breakfast  . polyethylene glycol  17 g Oral BID   Infusions:  . heparin 1,100 Units/hr (12/10/14 2216)    Assessment: 24 yo F admitted with worsening abd pain, N/V. Pt has chronic pelvic/abd pain and is on methadone for this. CT 9/15 showed ileitis. Colonoscopy 9/9 showed non-obs stricture. Her discomfort is out of proportion to her physical findings. MD suggesting tx or referral to tertiary care center.  Apixaban PTA for sinus thrombosis/portal vein thrombosis, last dose 9/29 AM. Pt started on heparin pending needs for procedures.  None planned at this time.   HL=0.48 on 1100 units/hr   Goal of Therapy:  Heparin level 0.3-0.7 units/ml Monitor platelets by  anticoagulation protocol: Yes   Plan:  Continue heaprin at 1100 units/hr Heparin level and CBC daily while on heparin Follow-up transition back to Apixaban as no plans for surgery  Dixie Dials, Pharm.D., BCPS Clinical Pharmacist Pager [phone] 12/11/2014 12:16 PM

## 2014-12-12 ENCOUNTER — Encounter (HOSPITAL_COMMUNITY): Payer: Self-pay | Admitting: Physician Assistant

## 2014-12-12 DIAGNOSIS — R933 Abnormal findings on diagnostic imaging of other parts of digestive tract: Secondary | ICD-10-CM

## 2014-12-12 LAB — CBC
HCT: 31 % — ABNORMAL LOW (ref 36.0–46.0)
Hemoglobin: 9.9 g/dL — ABNORMAL LOW (ref 12.0–15.0)
MCH: 25.1 pg — ABNORMAL LOW (ref 26.0–34.0)
MCHC: 31.9 g/dL (ref 30.0–36.0)
MCV: 78.7 fL (ref 78.0–100.0)
Platelets: 185 10*3/uL (ref 150–400)
RBC: 3.94 MIL/uL (ref 3.87–5.11)
RDW: 24.3 % — ABNORMAL HIGH (ref 11.5–15.5)
WBC: 4.1 10*3/uL (ref 4.0–10.5)

## 2014-12-12 LAB — GLUCOSE, CAPILLARY
Glucose-Capillary: 105 mg/dL — ABNORMAL HIGH (ref 65–99)
Glucose-Capillary: 99 mg/dL (ref 65–99)

## 2014-12-12 LAB — HEPARIN LEVEL (UNFRACTIONATED)
Heparin Unfractionated: 0.24 IU/mL — ABNORMAL LOW (ref 0.30–0.70)
Heparin Unfractionated: 0.56 IU/mL (ref 0.30–0.70)
Heparin Unfractionated: 0.57 IU/mL (ref 0.30–0.70)

## 2014-12-12 NOTE — Progress Notes (Signed)
Patient ID: Victoria Alvarez, female   DOB: March 07, 1991, 24 y.o.   MRN: [id number]     [facility]      [address]   [place], [place] [address]    Phone: [phone] FAX: [phone]     Subjective: Pain acutely worsened overnight.  On heparin gtt.  Had a CT on 10/2 which showed ileitis.  No vomiting.  Passing flatus and having BMs.   Objective:  Vital signs:  Filed Vitals:   12/11/14 1718 12/11/14 2105 12/12/14 0604 12/12/14 0910  BP: 101/42 128/73 120/60 98/52  Pulse: 68 79 65 66  Temp: 97.9 F (36.6 C) 98.6 F (37 C) 98.5 F (36.9 C) 97.7 F (36.5 C)  TempSrc: Oral Oral Oral Oral  Resp: Height:   (1.575 m)    Weight:  92.75 kg (204 lb 7.6 oz)    SpO2: 96% 100% 98% 99%    Last BM Date: 12/11/14  Intake/Output   Yesterday:  10/02 0701 - 10/03 0700 In: 840 [P.O.:840] Out: 2100 [Urine:2100] This shift:  Total I/O In: 120 [P.O.:120] Out: 400 [Urine:400]  Bowel function: I/O last 3 completed shifts: In: 840 [P.O.:840] Out: 2900 [Urine:2900] Total I/O In: 120 [P.O.:120] Out: 400 [Urine:400]  Physical Exam: General: Pt awake/alert/oriented x4 in mild acute distress  Abdomen: Soft.  Nondistended.  Diffuse tenderness. No evidence of peritonitis.  No incarcerated hernias.    Problem List:   Active Problems:   Anemia, iron deficiency   Seizure (HCC)   Portal vein thrombosis   Abdominal pain   Generalized abdominal pain    Results:   Labs: Results for orders placed or performed during the hospital encounter of 12/08/14 (from the past 48 hour(s))  Sedimentation rate     Status: None   Collection Time: 12/10/14  9:37 AM  Result Value Ref Range   Sed Rate 4 0 - 22 mm/hr  C-reactive protein     Status: None   Collection Time: 12/10/14  9:37 AM  Result Value Ref Range   CRP <0.5 <1.0 mg/dL  Glucose, capillary     Status: Abnormal   Collection Time: 12/10/14 11:29 AM  Result Value Ref  Range   Glucose-Capillary 110 (H) 65 - 99 mg/dL   Comment 1 Notify RN    Comment 2 Document in Chart   hCG, quantitative, pregnancy     Status: None   Collection Time: 12/10/14  4:10 PM  Result Value Ref Range   hCG, Beta Chain, Quant, S <1 <5 mIU/mL    Comment:          GEST. AGE      CONC.  (mIU/mL)   <=1 WEEK        5 - 50     2 WEEKS       50 - 500     3 WEEKS       100 - 10,000     4 WEEKS     1,000 - 30,000     5 WEEKS     3,500 - 115,000   6-8 WEEKS     12,000 - 270,000    12 WEEKS     15,000 - 220,000        FEMALE AND NON-PREGNANT FEMALE:     LESS THAN 5 mIU/mL   Glucose, capillary     Status: Abnormal   Collection Time: 12/11/14 12:10 AM  Result Value Ref Range  Glucose-Capillary 104 (H) 65 - 99 mg/dL  CBC     Status: Abnormal   Collection Time: 12/11/14  3:43 AM  Result Value Ref Range   WBC 4.0 4.0 - 10.5 K/uL   RBC 4.31 3.87 - 5.11 MIL/uL   Hemoglobin 10.8 (L) 12.0 - 15.0 g/dL   HCT 45.4 (L) 09.8 - 11.9 %   MCV 78.7 78.0 - 100.0 fL   MCH 25.1 (L) 26.0 - 34.0 pg   MCHC 31.9 30.0 - 36.0 g/dL   RDW 14.7 (H) 82.9 - 56.2 %   Platelets 175 150 - 400 K/uL  Heparin level (unfractionated)     Status: None   Collection Time: 12/11/14  3:43 AM  Result Value Ref Range   Heparin Unfractionated 0.48 0.30 - 0.70 IU/mL    Comment:        IF HEPARIN RESULTS ARE BELOW EXPECTED VALUES, AND PATIENT DOSAGE HAS BEEN CONFIRMED, SUGGEST FOLLOW UP TESTING OF ANTITHROMBIN III LEVELS.   Glucose, capillary     Status: None   Collection Time: 12/11/14  5:17 AM  Result Value Ref Range   Glucose-Capillary 79 65 - 99 mg/dL  Glucose, capillary     Status: Abnormal   Collection Time: 12/11/14 11:47 AM  Result Value Ref Range   Glucose-Capillary 127 (H) 65 - 99 mg/dL   Comment 1 Notify RN    Comment 2 Document in Chart   Glucose, capillary     Status: Abnormal   Collection Time: 12/11/14  4:51 PM  Result Value Ref Range   Glucose-Capillary 100 (H) 65 - 99 mg/dL   Comment 1  Notify RN    Comment 2 Document in Chart   Glucose, capillary     Status: None   Collection Time: 12/12/14 12:02 AM  Result Value Ref Range   Glucose-Capillary 99 65 - 99 mg/dL  CBC     Status: Abnormal   Collection Time: 12/12/14  4:05 AM  Result Value Ref Range   WBC 4.1 4.0 - 10.5 K/uL    Comment: REPEATED TO VERIFY   RBC 3.94 3.87 - 5.11 MIL/uL   Hemoglobin 9.9 (L) 12.0 - 15.0 g/dL    Comment: REPEATED TO VERIFY   HCT 31.0 (L) 36.0 - 46.0 %   MCV 78.7 78.0 - 100.0 fL   MCH 25.1 (L) 26.0 - 34.0 pg   MCHC 31.9 30.0 - 36.0 g/dL   RDW 13.0 (H) 86.5 - 78.4 %   Platelets 185 150 - 400 K/uL    Comment: REPEATED TO VERIFY  Heparin level (unfractionated)     Status: Abnormal   Collection Time: 12/12/14  4:05 AM  Result Value Ref Range   Heparin Unfractionated 0.24 (L) 0.30 - 0.70 IU/mL    Comment:        IF HEPARIN RESULTS ARE BELOW EXPECTED VALUES, AND PATIENT DOSAGE HAS BEEN CONFIRMED, SUGGEST FOLLOW UP TESTING OF ANTITHROMBIN III LEVELS.   Glucose, capillary     Status: Abnormal   Collection Time: 12/12/14  5:34 AM  Result Value Ref Range   Glucose-Capillary 105 (H) 65 - 99 mg/dL    Imaging / Studies: Ct Abdomen Pelvis W Contrast  12/10/2014   CLINICAL DATA:  24 year old female with acute abdominal pelvic pain for 2 days.  EXAM: CT ABDOMEN AND PELVIS WITH CONTRAST  TECHNIQUE: Multidetector CT imaging of the abdomen and pelvis was performed using the standard protocol following bolus administration of intravenous contrast.  CONTRAST:  OMNIPAQUE IOHEXOL 300  MG/ML  SOLN  COMPARISON:  11/24/2014 and prior studies  FINDINGS: Lower chest:  Unremarkable  Hepatobiliary: The liver and gallbladder are unremarkable. There is no evidence of biliary dilatation.  Pancreas: Unremarkable  Spleen: Unremarkable  Adrenals/Urinary Tract: The kidneys, adrenal glands and bladder are unremarkable.  Stomach/Bowel: There is circumferential wall thickening of a segment of small bowel/ ileum in the  right abdomen with adjacent inflammation (15 cm in length) compatible with enteritis and may represent Crohn's disease. The terminal ileum however appears normal. There is no evidence of bowel obstruction, pneumoperitoneum or abscess. Mildly enlarged mesenteric lymph nodes in the right abdomen are identified.  Vascular/Lymphatic: No enlarged lymph nodes or abdominal aortic aneurysm.  Reproductive: The uterus and adnexal regions are unremarkable.  Other: No free fluid  Musculoskeletal: No acute or suspicious abnormality.  IMPRESSION: Circumferential wall thickening of a loop of small bowel/ ileum in the right abdomen with adjacent inflammation (15 cm in length). This most likely represents an enteritis which would include Crohn's disease, but the terminal ileum appears normal.   Electronically Signed   By: Harmon Pier M.D.   On: 12/10/2014 17:43    Medications / Allergies:  Scheduled Meds: . FLUoxetine  20 mg Oral Daily  . levETIRAcetam  500 mg Oral BID  . methadone  85 mg Oral Q breakfast  . polyethylene glycol  17 g Oral BID   Continuous Infusions: . heparin 1,200 Units/hr (12/12/14 0614)   PRN Meds:.acetaminophen **OR** acetaminophen, HYDROmorphone (DILAUDID) injection, ondansetron **OR** ondansetron (ZOFRAN) IV  Antibiotics: Anti-infectives    None       Assessment/Plan Portal vein thrombosis Chronic pain syndrome Depression Constipation Seizure disorder Morbid obesity Nicotine abuse  Abdominal pain with known stricture and ileitis   Normal white count and hemodynamically stable.  Bowel sounds are present and abdomen is soft.  Pain seems out of proportion to objective findings.  No indications for urgent surgical intervention.  Recommend follow up at tertiary car facility.   Ashok Norris, [facility] Pager [phone]) For consults and floor pages call [phone])  12/12/2014 9:14 AM

## 2014-12-12 NOTE — Progress Notes (Signed)
ANTICOAGULATION CONSULT NOTE   Pharmacy Consult for Heparin Indication: Hx sinus thrombosis, portal vein thrombosis  Allergies  Allergen Reactions  . Ativan [Lorazepam] Other (See Comments)    " I don't remember anything."  . Bee Venom Swelling  . Ciprofloxacin Diarrhea and Nausea And Vomiting    Patient Measurements: Height:  (157.5 cm) Weight: 204 lb 7.6 oz (92.75 kg) IBW/kg (Calculated) : 50.1 H Vital Signs: Temp: 98.5 F (36.9 C) (10/03 0604) Temp Source: Oral (10/03 0604) BP: 120/60 mmHg (10/03 0604) Pulse Rate: 65 (10/03 0604)  Labs:  Recent Labs  12/09/14 1004 12/09/14 1611  12/10/14 0555 12/11/14 0343 12/12/14 0405  HGB  --   --   < > 9.9* 10.8* 9.9*  HCT  --   --   --  32.0* 33.9* 31.0*  PLT  --   --   --  187 175 185  APTT 74* 65*  --  60*  --   --   HEPARINUNFRC 0.75* 0.55  --  0.34 0.48 0.24*  < > = values in this interval not displayed.  Estimated Creatinine Clearance: 112.6 mL/min (by C-G formula based on Cr of 0.81).   Medical History: Past Medical History  Diagnosis Date  . Interstitial cystitis     pain from this was reason for starting opioids age 24.   . Chronic pelvic pain in female   . Headache(784.0)   . Pyelonephritis affecting pregnancy in first trimester July 2015  . Depression 2007  . Opioid dependence     to Rx opioids.  switched to Methadone after second child born in 04/2014  . Pneumonia 03/2014  . Seizures   . Stroke 05/2014    presented with right sided weakness.   . Numbness     right side  . Dizziness 07/18/14  . Confusion   . 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 Preeclampsia 2012  . Miscarriage 2005  . Non-compliance     with anticoagulant  . 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 Cerebral venous sinus thrombosis   . Morbid obesity     BMI 37 in 11/2014.  [name] Anemia 07/2014    Microcytic    Assessment: Sub-therapeutic heparin level   Goal of Therapy:  Heparin level 0.3-0.7 units/mL   Plan:  -Increase heparin to  1200 units/hr -1300 HL  Seren Chaloux 12/12/2014,6:14 AM

## 2014-12-12 NOTE — Progress Notes (Signed)
Victoria Alvarez KWI:[id number] DOB: 1990-10-21 DOA: 12/08/2014 PCP: Victoria Ends, MD  Brief narrative: 24 y.o. 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 Victoria Alvarez has been hospitalized 3x recently for similar symptoms.  She was feeling better after discharge 8/27 but still having cramping pain that was relieved with bowel movements.  9/4 she began having continuous abdominal pain not relieved with bowel movements. The pain is located thru out her abdomen. It stops her from eating. She was seen by GI at last admission and underwent colonoscopy with distal ileal stricture Gen surgery evaluated for possible surgery It was felt patient may benefit the most from being at a tertiary care facility and I called [facility] on last admission  Returned to [facility] with a  Few days n/v and abd pain without fever or chills Awaiting once again transfer to [facility] with no real options at [facility] for Rx  Past medical history-As per Problem list Chart reviewed as below-   Consultants: Gi  Surgery  Procedures:  CT pedning  Antibiotics:   none   Subjective   [place] tablet when I saw her, but then in the same breath complaining of severe pain No fever no chills   Objective    Interim History:   Telemetry:    Objective: Filed Vitals:   12/11/14 2105 12/12/14 0604 12/12/14 0910 12/12/14 1116  BP: 128/73 120/60 98/52 114/62  Pulse: 79 65 66 71  Temp: 98.6 F (37 C) 98.5 F (36.9 C) 97.7 F (36.5 C)   TempSrc: Oral Oral Oral   Resp: 20 19 18    Height: 5' 2"  (1.575 m)     Weight: 92.75 kg (204 lb 7.6 oz)     SpO2: 100% 98% 99%     Intake/Output Summary (Last 24 hours) at 12/12/14 1320 Last data filed at 12/12/14 1307  Gross per 24 hour  Intake    960 ml  Output   2050 ml    Net  -1090 ml    Exam:  General: eomi  obese Cardiovascular: s1 s 2no m/r/g Respiratory: clear Abdomen: soft ovese no specific tenderness but some lower quadrant discofot-pain clearly OOP to exam Skinno le edema Neuro intact  Data Reviewed: Basic Metabolic Panel:  Recent Labs Lab 12/08/14 2115 12/09/14 0412  NA 134* 142  K 3.9 4.1  CL 101 109  CO2 25 25  GLUCOSE 98 96  BUN 9 6  CREATININE 0.77 0.81  CALCIUM 9.0 8.7*   Liver Function Tests:  Recent Labs Lab 12/08/14 2115 12/09/14 0412  AST 33 25  ALT 40 33  ALKPHOS 47 42  BILITOT 1.1 0.8  PROT 5.6* 4.7*  ALBUMIN 3.5 3.0*    Recent Labs Lab 12/08/14 2115  LIPASE 26   No results for input(s): AMMONIA in the last 168 hours. CBC:  Recent Labs Lab 12/08/14 2115 12/09/14 0412 12/10/14 0555 12/11/14 0343 12/12/14 0405  WBC 7.0 4.9 4.2 4.0 4.1  NEUTROABS 4.3 2.4  --   --   --   HGB 11.9* 9.8* 9.9* 10.8* 9.9*  HCT 38.0 31.9* 32.0* 33.9* 31.0*  MCV 79.3 79.2 79.4 78.7 78.7  PLT 237 203 187 175 185   Cardiac Enzymes: No results for input(s): CKTOTAL, CKMB, CKMBINDEX, TROPONINI in the last 168 hours. BNP: Invalid input(s): POCBNP CBG:  Recent Labs Lab 12/11/14 0517 12/11/14  1147 12/11/14 1651 12/12/14 0002 12/12/14 0534  GLUCAP 79 127* 100* 99 105*    No results found for this or any previous visit (from the past 240 hour(s)).   Studies:              All Imaging reviewed and is as per above notation   Scheduled Meds: . FLUoxetine  20 mg Oral Daily  . levETIRAcetam  500 mg Oral BID  . methadone  85 mg Oral Q breakfast  . polyethylene glycol  17 g Oral BID   Continuous Infusions: . heparin 1,200 Units/hr (12/12/14 6725)     Assessment/Plan: 1. abd pain-unclear etiology.  Further steps per GI and Gen surg.  Agree pain oop to exam.  Backed down to liquid diet.  Patient NOT TO GO OFF THE FLOOR to get a diet-IV pain meds for now.  awaiitng tertiary care input at [facility] and transfer still  pending 2. Portal Venous thrombosis-no specific cause-Is on heparin now.  Transition back to oral AC when no procedures planned 3. Ho cerebral sinus thrmobisis-see above 4. Seizure disorder-keppra 500 bid 5. H/o Chr Pain with interstitial cystitis-usually on methadone 85 daily.  continue this and IV dilaudid and keep on the dilaudid for now 6. Hypercoag state-work-up done-defer to [facility] other thoughts  Code Status: full Family Communication:  None  Disposition Plan: unclear    Verneita Griffes, MD  [facility] Pager [phone] 12/12/2014, 1:20 PM    LOS: 3 days

## 2014-12-12 NOTE — Progress Notes (Signed)
ANTICOAGULATION CONSULT NOTE - Follow Up Consult  Pharmacy Consult for Heparin (while apixaban on hold) Indication: hx portal vein thrombus, CVA  Allergies  Allergen Reactions  . Ativan [Lorazepam] Other (See Comments)    " I don't remember anything."  . Bee Venom Swelling  . Ciprofloxacin Diarrhea and Nausea And Vomiting    Patient Measurements: Height:  (157.5 cm) Weight: 204 lb 7.6 oz (92.75 kg) IBW/kg (Calculated) : 50.1 Heparin Dosing Weight: 71.4 kg  Vital Signs: Temp: 97.7 F (36.5 C) (10/03 0910) Temp Source: Oral (10/03 0910) BP: 114/62 mmHg (10/03 1116) Pulse Rate: 71 (10/03 1116)  Labs:  Recent Labs  12/09/14 1611  12/10/14 0555 12/11/14 0343 12/12/14 0405 12/12/14 1230  HGB  --   < > 9.9* 10.8* 9.9*  --   HCT  --   --  32.0* 33.9* 31.0*  --   PLT  --   --  187 175 185  --   APTT 65*  --  60*  --   --   --   HEPARINUNFRC 0.55  --  0.34 0.48 0.24* 0.56  < > = values in this interval not displayed.  Estimated Creatinine Clearance: 112.6 mL/min (by C-G formula based on Cr of 0.81).   Medications:  Scheduled:  . FLUoxetine  20 mg Oral Daily  . levETIRAcetam  500 mg Oral BID  . methadone  85 mg Oral Q breakfast  . polyethylene glycol  17 g Oral BID   Infusions:  . heparin 1,200 Units/hr (12/12/14 1610)    Assessment: 24 yo F admitted with worsening abd pain, N/V. Pt has chronic pelvic/abd pain and is on methadone for this. CT 9/15 showed ileitis. Colonoscopy 9/9 showed non-obs stricture. Her discomfort is out of proportion to her physical findings. MD suggesting tx or referral to tertiary care center.  Apixaban PTA for sinus thrombosis/portal vein thrombosis, last dose 9/29 AM. Pt started on heparin pending needs for procedures.  None planned at this time.   Heparin level is 0.48 on 1200 units/hr. No bleeding noted, CBC is stable.  Goal of Therapy:  Heparin level 0.3-0.7 units/ml Monitor platelets by anticoagulation protocol: Yes    Plan:  Continue heparin drip at 1200 units/hr 6 hr confirmatory heparin level Heparin level and CBC daily while on heparin Transition back to Apixaban as no plans for surgery?  [place], [address].D., BCPS Clinical Pharmacist Pager: [phone] 12/12/2014 1:59 PM

## 2014-12-12 NOTE — Progress Notes (Signed)
ANTICOAGULATION CONSULT NOTE - Follow Up Consult  Pharmacy Consult for Heparin (while apixaban on hold) Indication: hx portal vein thrombus, CVA  Allergies  Allergen Reactions  . Ativan [Lorazepam] Other (See Comments)    " I don't remember anything."  . Bee Venom Swelling  . Ciprofloxacin Diarrhea and Nausea And Vomiting    Patient Measurements: Height: 5\' 2"  (157.5 cm) Weight: 205 lb 4 oz (93.1 kg) IBW/kg (Calculated) : 50.1 Heparin Dosing Weight: 71.4 kg  Vital Signs: Temp: 97.6 F (36.4 C) (10/03 1956) Temp Source: Oral (10/03 1956) BP: 120/70 mmHg (10/03 1956) Pulse Rate: 61 (10/03 1956)  Labs:  Recent Labs  12/10/14 0555 12/11/14 0343 12/12/14 0405 12/12/14 1230 12/12/14 1848  HGB 9.9* 10.8* 9.9*  --   --   HCT 32.0* 33.9* 31.0*  --   --   PLT 187 175 185  --   --   APTT 60*  --   --   --   --   HEPARINUNFRC 0.34 0.48 0.24* 0.56 0.57    Estimated Creatinine Clearance: 112.8 mL/min (by C-G formula based on Cr of 0.81).   Medications:  Scheduled:  . FLUoxetine  20 mg Oral Daily  . levETIRAcetam  500 mg Oral BID  . methadone  85 mg Oral Q breakfast  . polyethylene glycol  17 g Oral BID   Infusions:  . heparin 1,200 Units/hr (12/12/14 1844)    Assessment: 24 yo F admitted with worsening abd pain, N/V. Pt has chronic pelvic/abd pain and is on methadone for this. CT 9/15 showed ileitis. Colonoscopy 9/9 showed non-obs stricture. Her discomfort is out of proportion to her physical findings. MD suggesting tx or referral to tertiary care center.  Apixaban PTA for sinus thrombosis/portal vein thrombosis, last dose 9/29 AM. Pt started on heparin pending needs for procedures.  None planned at this time.   Heparin level remains therapeutic at 0.57 on 1200 units/hr. No bleeding noted per RN, CBC is stable.  Goal of Therapy:  Heparin level 0.3-0.7 units/ml Monitor platelets by anticoagulation protocol: Yes   Plan:  Continue heparin drip at 1200  units/hr Heparin level and CBC daily while on heparin Transition back to Apixaban as no plans for surgery?  Vinnie Level, PharmD., BCPS Clinical Pharmacist Pager [phone]

## 2014-12-12 NOTE — Progress Notes (Signed)
Daily Rounding Note  12/12/2014, 9:55 AM  LOS: 3 days   SUBJECTIVE:       Pain persists. More severe than ever.  Stools loose/watery, greenish.  No blood.  Nausea but no emesis. Tolerating clears  OBJECTIVE:         Vital signs in last 24 hours:    Temp:  [97.7 F (36.5 C)-98.6 F (37 C)] 97.7 F (36.5 C) (10/03 0910) Pulse Rate:  [65-79] 66 (10/03 0910) Resp:  [18-20] 18 (10/03 0910) BP: (98-128)/(42-73) 98/52 mmHg (10/03 0910) SpO2:  [96 %-100 %] 99 % (10/03 0910) Weight:  [204 lb 7.6 oz (92.75 kg)] 204 lb 7.6 oz (92.75 kg) (10/02 2105) Last BM Date: 12/11/14 Filed Weights   12/09/14 2032 12/10/14 2044 12/11/14 2105  Weight: 203 lb (92.08 kg) 203 lb (92.08 kg) 204 lb 7.6 oz (92.75 kg)   General: uncomfortable, depressed   Heart: RRR Chest: clear bil.   Abdomen: soft, ND.  BS hypoactive.  Tender diffusely to mild touch, no guard/rebound  Extremities: no CCE Neuro/Psych:  Alert.  Depressed.  Moves all 4s.    Intake/Output from previous day: 10/02 0701 - 10/03 0700 In: 840 [P.O.:840] Out: 2100 [Urine:2100]  Intake/Output this shift: Total I/O In: 120 [P.O.:120] Out: 400 [Urine:400]  Lab Results:  Recent Labs  12/10/14 0555 12/11/14 0343 12/12/14 0405  WBC 4.2 4.0 4.1  HGB 9.9* 10.8* 9.9*  HCT 32.0* 33.9* 31.0*  PLT 187 175 185   BMET No results for input(s): NA, K, CL, CO2, GLUCOSE, BUN, CREATININE, CALCIUM in the last 72 hours. LFT No results for input(s): PROT, ALBUMIN, AST, ALT, ALKPHOS, BILITOT, BILIDIR, IBILI in the last 72 hours. PT/INR No results for input(s): LABPROT, INR in the last 72 hours. Hepatitis Panel No results for input(s): HEPBSAG, HCVAB, HEPAIGM, HEPBIGM in the last 72 hours.  Studies/Results: Ct Abdomen Pelvis W Contrast  12/10/2014   CLINICAL DATA:  24 year old female with acute abdominal pelvic pain for 2 days.  EXAM: CT ABDOMEN AND PELVIS WITH CONTRAST  TECHNIQUE:  Multidetector CT imaging of the abdomen and pelvis was performed using the standard protocol following bolus administration of intravenous contrast.  CONTRAST:  155m OMNIPAQUE IOHEXOL 300 MG/ML  SOLN  COMPARISON:  11/24/2014 and prior studies  FINDINGS: Lower chest:  Unremarkable  Hepatobiliary: The liver and gallbladder are unremarkable. There is no evidence of biliary dilatation.  Pancreas: Unremarkable  Spleen: Unremarkable  Adrenals/Urinary Tract: The kidneys, adrenal glands and bladder are unremarkable.  Stomach/Bowel: There is circumferential wall thickening of a segment of small bowel/ ileum in the right abdomen with adjacent inflammation (15 cm in length) compatible with enteritis and may represent Crohn's disease. The terminal ileum however appears normal. There is no evidence of bowel obstruction, pneumoperitoneum or abscess. Mildly enlarged mesenteric lymph nodes in the right abdomen are identified.  Vascular/Lymphatic: No enlarged lymph nodes or abdominal aortic aneurysm.  Reproductive: The uterus and adnexal regions are unremarkable.  Other: No free fluid  Musculoskeletal: No acute or suspicious abnormality.  IMPRESSION: Circumferential wall thickening of a loop of small bowel/ ileum in the right abdomen with adjacent inflammation (15 cm in length). This most likely represents an enteritis which would include Crohn's disease, but the terminal ileum appears normal.   Electronically Signed   By: JMargarette CanadaM.D.   On: 12/10/2014 17:43   Scheduled Meds: . FLUoxetine  20 mg Oral Daily  . levETIRAcetam  500 mg Oral  BID  . methadone  85 mg Oral Q breakfast  . polyethylene glycol  17 g Oral BID   Continuous Infusions: . heparin 1,200 Units/hr (12/12/14 0614)   PRN Meds:.acetaminophen **OR** acetaminophen, HYDROmorphone (DILAUDID) injection, ondansetron **OR** ondansetron (ZOFRAN) IV   ASSESMENT:   *  Recurrent acute on chronic abdominal pain in pt with established dx focal ileitis with  non-obstructive stricturing. ? If ileitis is Crohn's disease or sequelae of ischemic injury. Path also suggested NSAID injury.   11/18/2014 colonoscopy with inflammation/ulceration 10 to 15 cm from IC valve.  Pathology: chronic active ileitis. No response to steroids (subsequently discontinued) during 9/5 to 9/26 admission.   CRP, ESR consistently normal.   Frequently constipated.  S/p SMOG enema 9/30. On BID Miralax. Stools are loose.  Pain out of proportion to objective findings.   Surgery is following and no plans for surgery, feel pain is out of proprtion to findings. They want her to be seen at a tertiary care facility.   *  Hypercoagulable disorder. Hx CVA, hx PVT/sagital sinus thrombosis.  On Eliquis chronically, currently on hold with Heparin gtt in place.   *  Chronic narcotics since age 50 or 17, switched to Methadone within the last 12 months. ? Narcotic bowel syndrome?   *  Anemia,  Iron deficient in 07/2014.      PLAN   *  Could try to arrange transfer to [facility] but criteria for discharge is not acute so likely it will take several days if they accept her.   *  No changes to care plan.     Victoria Alvarez  12/12/2014, 9:55 AM Pager: [phone]     Attending physician's note   I have taken an interval history, reviewed the chart and examined the patient. I agree with the Advanced Practitioner's note, impression and recommendations. Recurrent abdominal pain and ileitis-etiology unclear. Continue treatment for constipation. Agree with recommendations for transfer to a tertiary center, such as [facility], as mentioned in the surgical and hospitalist notes.   Lucio Edward, MD [facility] [phone] Mon-Fri 8a-5p [phone] after 5p, weekends, holidays

## 2014-12-13 DIAGNOSIS — I81 Portal vein thrombosis: Secondary | ICD-10-CM

## 2014-12-13 LAB — CBC
HCT: 37.5 % (ref 36.0–46.0)
Hemoglobin: 11.6 g/dL — ABNORMAL LOW (ref 12.0–15.0)
MCH: 24.5 pg — ABNORMAL LOW (ref 26.0–34.0)
MCHC: 30.9 g/dL (ref 30.0–36.0)
MCV: 79.1 fL (ref 78.0–100.0)
Platelets: 188 10*3/uL (ref 150–400)
RBC: 4.74 MIL/uL (ref 3.87–5.11)
RDW: 23.5 % — ABNORMAL HIGH (ref 11.5–15.5)
WBC: 4.8 10*3/uL (ref 4.0–10.5)

## 2014-12-13 LAB — HEPARIN LEVEL (UNFRACTIONATED): Heparin Unfractionated: 0.51 IU/mL (ref 0.30–0.70)

## 2014-12-13 LAB — GLUCOSE, CAPILLARY
Glucose-Capillary: 92 mg/dL (ref 65–99)
Glucose-Capillary: 104 mg/dL — ABNORMAL HIGH (ref 65–99)
Glucose-Capillary: 107 mg/dL — ABNORMAL HIGH (ref 65–99)
Glucose-Capillary: 87 mg/dL (ref 65–99)
Glucose-Capillary: 86 mg/dL (ref 65–99)

## 2014-12-13 MED ORDER — DIAZEPAM 2.5 MG RE GEL
2.5000 mg | Freq: Once | RECTAL | Status: AC
  Administered 2014-12-13: 2.5 mg via RECTAL
  Filled 2014-12-13: qty 2.5

## 2014-12-13 MED ORDER — GABAPENTIN 100 MG PO CAPS
100.0000 mg | ORAL_CAPSULE | Freq: Three times a day (TID) | ORAL | Status: DC
  Administered 2014-12-13 – 2014-12-15 (×6): 100 mg via ORAL
  Filled 2014-12-13 (×6): qty 1

## 2014-12-13 MED ORDER — FLEET ENEMA 7-19 GM/118ML RE ENEM
1.0000 | ENEMA | Freq: Every day | RECTAL | Status: DC | PRN
  Administered 2014-12-13: 1 via RECTAL
  Filled 2014-12-13 (×2): qty 1

## 2014-12-13 MED ORDER — APIXABAN 5 MG PO TABS
5.0000 mg | ORAL_TABLET | Freq: Two times a day (BID) | ORAL | Status: DC
  Administered 2014-12-13 – 2014-12-19 (×13): 5 mg via ORAL
  Filled 2014-12-13 (×13): qty 1

## 2014-12-13 MED ORDER — OXYCODONE HCL 5 MG PO TABS
5.0000 mg | ORAL_TABLET | Freq: Four times a day (QID) | ORAL | Status: DC
  Administered 2014-12-13 – 2014-12-14 (×3): 5 mg via ORAL
  Filled 2014-12-13 (×4): qty 1

## 2014-12-13 NOTE — Progress Notes (Signed)
  Speculum exam Cervix patulous, no discharge Pain oop to exam No CMT just spasm  Will add valium intra-vaginally and see result  Verneita Griffes, MD [facility] [phone]

## 2014-12-13 NOTE — Progress Notes (Signed)
Victoria Alvarez MEQ:[id number] DOB: September 09, 1990 DOA: 12/08/2014 PCP: Victoria Ends, MD  Brief narrative: 24 y.o. 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  Admitted with severe abdominal pain. Victoria Alvarez has been hospitalized 3x recently for similar symptoms.  She was feeling better after discharge 8/27 but still having cramping pain that was relieved with bowel movements.  9/4 she began having continuous abdominal pain not relieved with bowel movements. The pain is located thru out her abdomen. It stops her from eating. She was seen by GI at last admission and underwent colonoscopy with distal ileal stricture Gen surgery evaluated for possible surgery It was felt patient may benefit the most from being at a tertiary care facility and I called [facility] on last admission  Returned to [facility] with a  Few days n/v and abd pain without fever or chills Awaiting once again transfer to [facility] with no real options at [facility] for Rx  Past medical history-As per Problem list Chart reviewed as below-   Consultants: Gi  Surgery  Procedures:  CT pedning  Antibiotics:   none   Subjective   No respite from pain Awaiting enema  No n/v No stool    Objective    Interim History:   Telemetry:    Objective: Filed Vitals:   12/12/14 1536 12/12/14 1956 12/13/14 0541 12/13/14 0744  BP: 116/55 120/70 117/71 127/76  Pulse: 84 61 68 69  Temp: 98.5 F (36.9 C) 97.6 F (36.4 C) 98.2 F (36.8 C) 98 F (36.7 C)  TempSrc: Oral Oral Oral Oral  Resp: 18 16 20 19   Height:      Weight:  93.1 kg (205 lb 4 oz)    SpO2: 100% 98% 97% 100%    Intake/Output Summary (Last 24 hours) at 12/13/14 1344 Last data filed at 12/13/14 0930  Gross per 24 hour  Intake   2640 ml    Output      0 ml  Net   2640 ml    Exam:  General: eomi  obese Cardiovascular: s1 s 2no m/r/g Respiratory: clear Abdomen: soft ovese no specific tenderness but some lower quadrant discofot-pain clearly OOP to exam Skinno le edema Neuro intact  Data Reviewed: Basic Metabolic Panel:  Recent Labs Lab 12/08/14 2115 12/09/14 0412  NA 134* 142  K 3.9 4.1  CL 101 109  CO2 25 25  GLUCOSE 98 96  BUN 9 6  CREATININE 0.77 0.81  CALCIUM 9.0 8.7*   Liver Function Tests:  Recent Labs Lab 12/08/14 2115 12/09/14 0412  AST 33 25  ALT 40 33  ALKPHOS 47 42  BILITOT 1.1 0.8  PROT 5.6* 4.7*  ALBUMIN 3.5 3.0*    Recent Labs Lab 12/08/14 2115  LIPASE 26   No results for input(s): AMMONIA in the last 168 hours. CBC:  Recent Labs Lab 12/08/14 2115 12/09/14 0412 12/10/14 0555 12/11/14 0343 12/12/14 0405 12/13/14 0715  WBC 7.0 4.9 4.2 4.0 4.1 4.8  NEUTROABS 4.3 2.4  --   --   --   --   HGB 11.9* 9.8* 9.9* 10.8* 9.9* 11.6*  HCT 38.0 31.9* 32.0* 33.9* 31.0* 37.5  MCV 79.3 79.2 79.4 78.7 78.7 79.1  PLT 237 203 187 175 185 188   Cardiac Enzymes: No results for input(s): CKTOTAL, CKMB, CKMBINDEX, TROPONINI in the last 168  hours. BNP: Invalid input(s): POCBNP CBG:  Recent Labs Lab 12/12/14 0534 12/12/14 2359 12/13/14 0541 12/13/14 0743 12/13/14 1255  GLUCAP 105* 92 104* 107* 87    No results found for this or any previous visit (from the past 240 hour(s)).   Studies:              All Imaging reviewed and is as per above notation   Scheduled Meds: . apixaban  5 mg Oral BID  . FLUoxetine  20 mg Oral Daily  . levETIRAcetam  500 mg Oral BID  . methadone  85 mg Oral Q breakfast  . oxyCODONE  5 mg Oral Q6H  . polyethylene glycol  17 g Oral BID   Continuous Infusions:     Assessment/Plan: 1. Abd pain-unclear etiology.  Further steps per GI and Gen surg.  I have asked Urology to evaluate the patient as well today 12/13/14 as she might have a component of  this pain from this.  Agree pain oop to exam.  Backed down to liquid diet. Patient NOT TO GO OFF THE FLOOR to get a diet-IV pain meds for now.  awaiting tertiary care input at [facility] and transfer still pending 2. Portal Venous thrombosis-no specific cause-was on heparin -Transition back to oral AC when no procedures planned 3. Ho cerebral sinus thrmobisis-see above 4. Seizure disorder-keppra 500 bid 5. H/o Chr Pain with interstitial cystitis-usually on methadone 85 daily.  continue this and IV dilaudid and keep on the dilaudid for now.  Have started low dose oxy IR 10/4.  Spoke with Dr. Gaynelle Arabian who recommends NO narcotics for Interstitial cystitis, recommends Valium Vaginal suppositories as well as a pelvic exam which I will do later today 6. Hypercoag state-work-up done-defer to [facility] other thoughts  Code Status: full Family Communication:  None  Disposition Plan: unclear    Verneita Griffes, MD  [facility] Pager [phone] 12/13/2014, 1:44 PM    LOS: 4 days

## 2014-12-13 NOTE — Progress Notes (Signed)
Daily Rounding Note  12/13/2014, 9:01 AM  LOS: 4 days   SUBJECTIVE:       Tolerating clear liquid diet. Had nausea when eating solids.  Another bad night with ongoing, severe pain.  Says it is getting worse, rather than improving.  No BM yesterday, last was on 10/2, asking for a fleet's enema.    OBJECTIVE:         Vital signs in last 24 hours:    Temp:  [97.6 F (36.4 C)-98.5 F (36.9 C)] 98 F (36.7 C) (10/04 0744) Pulse Rate:  [61-84] 69 (10/04 0744) Resp:  [16-20] 19 (10/04 0744) BP: (98-127)/(52-76) 127/76 mmHg (10/04 0744) SpO2:  [97 %-100 %] 100 % (10/04 0744) Weight:  [205 lb 4 oz (93.1 kg)] 205 lb 4 oz (93.1 kg) (10/03 1956) Last BM Date: 12/11/14 Filed Weights   12/10/14 2044 12/11/14 2105 12/12/14 1956  Weight: 203 lb (92.08 kg) 204 lb 7.6 oz (92.75 kg) 205 lb 4 oz (93.1 kg)   General: depressed. Not acutely ill appearing.    Heart: RRR Chest: clear bil.  No  Abdomen: soft, ND.  Tender to the lightest pressure, mostly in lower mid and RLQ region.  BS active.   Extremities: no CCE Neuro/Psych:  Oriented x 3.  Depressed.    Intake/Output from previous day: 10/03 0701 - 10/04 0700 In: 3500.7 [P.O.:2640; I.V.:860.7] Out: 600 [Urine:600]  Intake/Output this shift: Total I/O In: 240 [P.O.:240] Out: 0   Lab Results:  Recent Labs  12/11/14 0343 12/12/14 0405 12/13/14 0715  WBC 4.0 4.1 4.8  HGB 10.8* 9.9* 11.6*  HCT 33.9* 31.0* 37.5  PLT 175 185 188   BMET No results for input(s): NA, K, CL, CO2, GLUCOSE, BUN, CREATININE, CALCIUM in the last 72 hours.  Studies/Results: No results found.   Scheduled Meds: . FLUoxetine  20 mg Oral Daily  . levETIRAcetam  500 mg Oral BID  . methadone  85 mg Oral Q breakfast  . polyethylene glycol  17 g Oral BID   Continuous Infusions: . 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 ? If ileitis is Crohn's disease or sequelae of ischemic injury. Path also suggested NSAID injury.  11/18/2014 colonoscopy with inflammation/ulceration 10 to 15 cm from IC valve. Pathology: chronic active ileitis. No response to steroids (subsequently discontinued) during 9/5 to 9/26 admission.  CRP, ESR consistently normal.  Frequently constipated. S/p SMOG enema 9/30. On BID Miralax.  Outpt was using Linzess. Stools are loose.  Pain out of proportion to objective findings.  Surgery is following and no plans for surgery, feel pain is out of proprtion to findings. They want her to be seen at a tertiary care facility.   * Hypercoagulable disorder. Hx CVA, hx PVT/sagital sinus thrombosis. On Eliquis chronically, currently on hold with Heparin gtt in place.   * Chronic narcotics since age 25 or 41, switched to Methadone within the last 12 months. ? Element of narcotic bowel syndrome?   * Anemia, Iron deficient in 07/2014.     PLAN   *  As we have no plans for endoscopic intervention and surgery has no plans for laparotomy, wonder if Eliquis should be restarted?   *  Ordered daily prn fleet's enema  *  Wonder if neurontin would be worth a trial? Fearful of using tricyclic anti-depressant given the s/e profile of constipation.   *  ? Palliative care consult for pain mgt?  *   Has contact been made with [facility] re: transfer?  I know she was on waiting list during her most recent admission.     Azucena Freed  12/13/2014, 9:01 AM Pager: [phone]     Attending physician's note   I have taken an interval history, reviewed the chart and examined the patient. I agree with the Advanced Practitioner's note, impression and recommendations. Consider consult to assist with pain management, defer to  primary service. Consider restarting Eliquis as there are no plans for endoscopic procedures or surgery at this time, defer to primary service. Fleets enema to help with constipation. Awaiting transfer to [facility].   Lucio Edward, MD [facility] [phone] Mon-Fri 8a-5p [phone] after 5p, weekends, holidays

## 2014-12-13 NOTE — Discharge Instructions (Signed)
Information on my medicine - ELIQUIS (apixaban)  This medication education was reviewed with me or my healthcare representative as part of my discharge preparation.  The pharmacist that spoke with me during my hospital stay was:  [facility], Maryanna Shape, [facility]  Why was Eliquis prescribed for you? Eliquis was prescribed to treat blood clots that may have been found in the veins of your legs (deep vein thrombosis) or in your lungs (pulmonary embolism) and to reduce the risk of them occurring again.  What do You need to know about Eliquis ? Your dose is 5 mg taken TWICE daily.  Eliquis may be taken with or without food.   Try to take the dose about the same time in the morning and in the evening. If you have difficulty swallowing the tablet whole please discuss with your pharmacist how to take the medication safely.  Take Eliquis exactly as prescribed and DO NOT stop taking Eliquis without talking to the doctor who prescribed the medication.  Stopping may increase your risk of developing a new blood clot.  Refill your prescription before you run out.  After discharge, you should have regular check-up appointments with your healthcare provider that is prescribing your Eliquis.    What do you do if you miss a dose? If a dose of ELIQUIS is not taken at the scheduled time, take it as soon as possible on the same day and twice-daily administration should be resumed. The dose should not be doubled to make up for a missed dose.  Important Safety Information A possible side effect of Eliquis is bleeding. You should call your healthcare provider right away if you experience any of the following: ? Bleeding from an injury or your nose that does not stop. ? Unusual colored urine (red or dark brown) or unusual colored stools (red or black). ? Unusual bruising for unknown reasons. ? A serious fall or if you hit your head (even if there is no bleeding).  Some medicines may interact with Eliquis  and might increase your risk of bleeding or clotting while on Eliquis. To help avoid this, consult your healthcare provider or pharmacist prior to using any new prescription or non-prescription medications, including herbals, vitamins, non-steroidal anti-inflammatory drugs (NSAIDs) and supplements.  This website has more information on Eliquis (apixaban): http://www.eliquis.com/eliquis/home

## 2014-12-14 ENCOUNTER — Inpatient Hospital Stay (HOSPITAL_COMMUNITY): Payer: Medicaid Other

## 2014-12-14 DIAGNOSIS — K56699 Other intestinal obstruction unspecified as to partial versus complete obstruction: Secondary | ICD-10-CM | POA: Diagnosis present

## 2014-12-14 DIAGNOSIS — K5900 Constipation, unspecified: Secondary | ICD-10-CM

## 2014-12-14 DIAGNOSIS — D638 Anemia in other chronic diseases classified elsewhere: Secondary | ICD-10-CM

## 2014-12-14 DIAGNOSIS — K529 Noninfective gastroenteritis and colitis, unspecified: Secondary | ICD-10-CM

## 2014-12-14 NOTE — Progress Notes (Signed)
Late Entry for 12/13/14  Patient was given Valium (diastat) intra-vaginally.  Tolerated well.

## 2014-12-14 NOTE — Progress Notes (Signed)
Daily Rounding Note  12/14/2014, 9:14 AM  LOS: 5 days   SUBJECTIVE:       Pin meds yesterday included her chronic 85 mg of Methadone, 10 mg of oxycodone, 7 mg IV Dilaudid.  Has used 5 mg oxycodone, 5 mg Dilaudid thus far today.  Hospitalist performed pelvic exam, noted spasm but no CMT, and initiated intra-vaginal valium yesterday (per GU recommendation) . Pain is worse and worse.  Small BM after fleet's enema yesterday.  Some nausea, no emesis on clears.    OBJECTIVE:         Vital signs in last 24 hours:    Temp:  [97.8 F (36.6 C)-98.6 F (37 C)] 98.6 F (37 C) (10/05 0507) Pulse Rate:  [65-87] 87 (10/05 0507) Resp:  [18] 18 (10/05 0507) BP: (112-121)/(62-79) 121/69 mmHg (10/05 0507) SpO2:  [100 %] 100 % (10/05 0507) Weight:  [199 lb (90.266 kg)] 199 lb (90.266 kg) (10/04 2033) Last BM Date: 12/13/14 Filed Weights   12/11/14 2105 12/12/14 1956 12/13/14 2033  Weight: 204 lb 7.6 oz (92.75 kg) 205 lb 4 oz (93.1 kg) 199 lb (90.266 kg)   General: uncomfortable, seen in fluoro room   Heart: RRR Chest: clear bil.  No cough or dyspnea Abdomen: soft, tender diffusely but worse in periumbilical region and right lower quadrant.  + BS without tinkling or tympanitic BS  Extremities: no CCE Neuro/Psych:  Near tears, distressed.  Oriented x 3 and appropriate.   Intake/Output from previous day: 10/04 0701 - 10/05 0700 In: 1400 [P.O.:1400] Out: 0   Intake/Output this shift:    Lab Results:  Recent Labs  12/12/14 0405 12/13/14 0715  WBC 4.1 4.8  HGB 9.9* 11.6*  HCT 31.0* 37.5  PLT 185 188   BMET No results for input(s): NA, K, CL, CO2, GLUCOSE, BUN, CREATININE, CALCIUM in the last 72 hours. LFT No results for input(s): PROT, ALBUMIN, AST, ALT, ALKPHOS, BILITOT, BILIDIR, IBILI in the last 72 hours. PT/INR No results for input(s): LABPROT, INR in the last 72 hours. Hepatitis Panel No results for input(s): HEPBSAG,  HCVAB, HEPAIGM, HEPBIGM in the last 72 hours.  Studies/Results: No results found.  Scheduled Meds: . apixaban  5 mg Oral BID  . FLUoxetine  20 mg Oral Daily  . gabapentin  100 mg Oral TID  . levETIRAcetam  500 mg Oral BID  . methadone  85 mg Oral Q breakfast  . oxyCODONE  5 mg Oral Q6H  . polyethylene glycol  17 g Oral BID   Continuous Infusions:  PRN Meds:.acetaminophen **OR** acetaminophen, HYDROmorphone (DILAUDID) injection, ondansetron **OR** ondansetron (ZOFRAN) IV, sodium phosphate  ASSESMENT:   * Recurrent acute on chronic abdominal pain in pt with focal ileitis and non-obstructive stricturing. ? Crohn's disease or sequelae of ischemic injury?. Path also suggested NSAID injury.  11/18/2014 colonoscopy with inflammation/ulceration 10 to 15 cm from IC valve. Pathology: chronic active ileitis. No response to steroids (subsequently discontinued) during 9/5 to 9/26 admission.  CRP, ESR consistently normal.  Frequently constipated. S/p SMOG enema 9/30. On BID Miralax. Linzess trialed during recent admission.  Surgery following and no plans for surgery, feel pain is out of proprtion to findings. Suggest transfer to tertiary care facility.  Gabapentin initiated 10/4 PM.   SBFT 10/5. Has tight stricture in area of distal ileum with some upstream SB dilatation.   * Hypercoagulable disorder. Hx CVA, hx PVT/sagital sinus thrombosis. On Eliquis chronically, currently on hold with  Heparin gtt in place.   * Chronic narcotics since age 1 or 24, switched to Methadone within the last 12 months. ? Element of narcotic bowel syndrome?   * Anemia,iron deficient in 07/2014.    PLAN   *  Have surgeon's review the SBFT and see if they are any more willing to perform laparotomy and ileal resection here in [place].      Azucena Freed  12/14/2014, 9:14 AM Pager: [phone]

## 2014-12-14 NOTE — Progress Notes (Addendum)
PROGRESS NOTE    Victoria Alvarez VOJ:[id number] DOB: September 05, 1990 DOA: 12/08/2014 PCP: Minerva Ends, MD  Specialists: Dr.Pyrtle. Gastroenterologist.  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, depression, substance abuse, interstitial cystitis, hypercoagulable disorder, CVA, portal vein thrombosis diagnosed 09/2014 at which time Eliquis was started, well known to [facility] who have evaluated her since July 2016 with CT abdomen and colonic inflammation was felt due to ischemic injury, admitted 8/24-8/28 for abdominal pain at which time CTA showed increased mesenteric stranding, lymphadenopathy in the region of long segment distal ileal thickening and focal terminal ileal distention-GI and surgery consulted and treated conservatively with antibiotics, readmitted 9/5-9/26 with similar GI symptoms at which time CT abdomen redemonstrated distal ileal enteritis, failed attempted colonoscopy 9/7 due to poor prep, successful colonoscopy 9/9 showed distal ileal stricture and active ileitis-pathology suggestive of IBD versus drug-induced/NSAID induced injury, steroids initiated without improvement, CTA 9/15 demonstrated 20 cm segment of distal ileal thickening-surgery did not want to perform resection until 2 weeks off of prednisone and recommended transfer to tertiary care facility and patient placed on waiting list at [facility], was not transferred and discharged home after she improved. Readmitted 12/05/14 with worsening of similar symptoms as prior to admission. [facility] has consulted and continue to follow and recommend transfer to a tertiary facility (on [facility] waiting list). Etiology of her abdominal pain is not entirely clear but her discomfort seems to be out of proportion with physical findings. Palliative medicine consulted on 10/5 for symptom management.   Assessment/Plan:   Acute on chronic  abdominal pain, nausea and vomiting - Complicated history with frequent hospitalizations recently - Etiology not fully clear but DD-Crohn's disease/IBD, vascular/ischemia, doubt infectious, constipation contribute in - Symptoms felt to be out of proportion with clinical findings or lab data. No evidence of obstruction. - Has undergone extensive recent evaluation as indicated above. Steroids have not helped in the past. Gabapentin started 10/4 - [facility] consultation and follow-up appreciated. They recommend transfer to [facility] for further evaluation-on waiting list at [facility] - no plans for endoscopy Intervention or surgery at this time - Dr. Verlon Au performed speculum exam on 10/4 and added intravaginal Valium. Urine pregnancy test negative - Palliative care consulted on 10/5 for symptom management - Discussed with [facility] on 10/5: Follow-up small bowel series and if negative and then may consider capsule endoscopy  Anemia of chronic disease/normocytic anemia  - Stable   Hypercoagulable disorder/portal vein & sagittal sinus thrombosis/history of CVA  - On IV heparin switched to Eliquis 10/4.  - Hematologist has recommended lifelong anticoagulation.   Chronic pain/methadone maintenance - Apparently has been on narcotics since mid teens for management of interstitial cystitis  Seizure disorder - Continue Keppra  Interstitial cystitis - Dr. Verlon Au discussed with urology/Dr. Era Bumpers on 10/4 recommended no narcotics for interstitial cystitis but did recommend vaginal Valium suppositories  Tobacco abuse - Cessation counseled  Constipation - Complicating chronic narcotic use. May be contributing to her abdominal pain - Status post smog edema 9/30 and Fleet Enema 10/4 with effect   DVT prophylaxis: was IV heparin anticoagulation & switched to Eliquis 10/4 Code Status: Full Family Communication: none at bedside Disposition Plan:  awaiting transfer to [facility]  when bed available.   Consultants:  General surgery  [facility]  Palliative care medicine  Procedures:  None  Antibiotics:   None  Subjective: Severe abdominal pain.  Tolerating liquid diet. As per nursing, had soft BM after enema on 10/4. Nauseous yesterday but no vomiting.  Objective: Filed Vitals:   12/13/14 1806 12/13/14 2033 12/14/14 0507 12/14/14 0945  BP: 121/79 112/62 121/69 113/58  Pulse: 69 65 87 89  Temp: 98.4 F (36.9 C) 97.8 F (36.6 C) 98.6 F (37 C) 99.3 F (37.4 C)  TempSrc: Oral   Oral  Resp:  18 18 18   Height:      Weight:  90.266 kg (199 lb)    SpO2: 100% 100% 100% 100%    Intake/Output Summary (Last 24 hours) at 12/14/14 1242 Last data filed at 12/13/14 2343  Gross per 24 hour  Intake    920 ml  Output      0 ml  Net    920 ml   Filed Weights   12/11/14 2105 12/12/14 1956 12/13/14 2033  Weight: 92.75 kg (204 lb 7.6 oz) 93.1 kg (205 lb 4 oz) 90.266 kg (199 lb)     Exam:  General exam: Moderately built and nourished pleasant young female lying uncomfortably in bed with some painful distress. Heating pad on abdomen.  Respiratory system: Clear. No increased work of breathing. Cardiovascular system: S1 & S2 heard, RRR. No JVD, murmurs, gallops, clicks or pedal edema. Gastrointestinal system: Abdomen is nondistended, soft and nontender. Normal bowel sounds heard. Central nervous system: Alert and oriented. No focal neurological deficits. Extremities: Symmetric 5 x 5 power.   Data Reviewed: Basic Metabolic Panel:  Recent Labs Lab 12/08/14 2115 12/09/14 0412  NA 134* 142  K 3.9 4.1  CL 101 109  CO2 25 25  GLUCOSE 98 96  BUN 9 6  CREATININE 0.77 0.81  CALCIUM 9.0 8.7*   Liver Function Tests:  Recent Labs Lab 12/08/14 2115 12/09/14 0412  AST 33 25  ALT 40 33  ALKPHOS 47 42  BILITOT 1.1 0.8  PROT 5.6* 4.7*  ALBUMIN 3.5 3.0*    Recent Labs Lab 12/08/14 2115  LIPASE 26   No results for input(s): AMMONIA in the last  168 hours. CBC:  Recent Labs Lab 12/08/14 2115 12/09/14 0412 12/10/14 0555 12/11/14 0343 12/12/14 0405 12/13/14 0715  WBC 7.0 4.9 4.2 4.0 4.1 4.8  NEUTROABS 4.3 2.4  --   --   --   --   HGB 11.9* 9.8* 9.9* 10.8* 9.9* 11.6*  HCT 38.0 31.9* 32.0* 33.9* 31.0* 37.5  MCV 79.3 79.2 79.4 78.7 78.7 79.1  PLT 237 203 187 175 185 188   Cardiac Enzymes: No results for input(s): CKTOTAL, CKMB, CKMBINDEX, TROPONINI in the last 168 hours. BNP (last 3 results) No results for input(s): PROBNP in the last 8760 hours. CBG:  Recent Labs Lab 12/12/14 2359 12/13/14 0541 12/13/14 0743 12/13/14 1255 12/13/14 1803  GLUCAP 92 104* 107* 87 86    No results found for this or any previous visit (from the past 240 hour(s)).         Studies: No results found.      Scheduled Meds: . apixaban  5 mg Oral BID  . FLUoxetine  20 mg Oral Daily  . gabapentin  100 mg Oral TID  . levETIRAcetam  500 mg Oral BID  . methadone  85 mg Oral Q breakfast  . oxyCODONE  5 mg Oral Q6H  . polyethylene glycol  17 g Oral BID   Continuous Infusions:   Active Problems:   Anemia, iron deficiency   Seizure (HCC)   Portal vein thrombosis  Abdominal pain   Generalized abdominal pain    Time spent: 56 minutes     Arielis Leonhart, MD, FACP, FHM. [facility] Pager [phone]  If 7PM-7AM, please contact night-coverage [email or web address] Password [email or web address] 12/14/2014, 12:42 PM    LOS: 5 days

## 2014-12-15 DIAGNOSIS — R1033 Periumbilical pain: Secondary | ICD-10-CM

## 2014-12-15 DIAGNOSIS — R569 Unspecified convulsions: Secondary | ICD-10-CM

## 2014-12-15 DIAGNOSIS — K5669 Other intestinal obstruction: Secondary | ICD-10-CM

## 2014-12-15 DIAGNOSIS — Z515 Encounter for palliative care: Secondary | ICD-10-CM

## 2014-12-15 LAB — URINALYSIS, ROUTINE W REFLEX MICROSCOPIC
Bilirubin Urine: NEGATIVE
Glucose, UA: NEGATIVE mg/dL
Hgb urine dipstick: NEGATIVE
Ketones, ur: NEGATIVE mg/dL
Leukocytes, UA: NEGATIVE
Nitrite: NEGATIVE
Protein, ur: NEGATIVE mg/dL
Specific Gravity, Urine: 1.005 (ref 1.005–1.030)
Urobilinogen, UA: 1 mg/dL (ref 0.0–1.0)
pH: 6.5 (ref 5.0–8.0)

## 2014-12-15 LAB — C-REACTIVE PROTEIN: CRP: <0.5 mg/dL (ref <1.0)

## 2014-12-15 LAB — SEDIMENTATION RATE: Sed Rate: 5 mm/hr (ref 0–22)

## 2014-12-15 MED ORDER — SENNA 8.6 MG PO TABS
2.0000 | ORAL_TABLET | Freq: Every day | ORAL | Status: DC
  Administered 2014-12-15: 17.2 mg via ORAL
  Filled 2014-12-15: qty 2

## 2014-12-15 MED ORDER — DULOXETINE HCL 30 MG PO CPEP
30.0000 mg | ORAL_CAPSULE | Freq: Every day | ORAL | Status: DC
  Administered 2014-12-15 – 2014-12-19 (×5): 30 mg via ORAL
  Filled 2014-12-15 (×5): qty 1

## 2014-12-15 MED ORDER — LORAZEPAM 0.5 MG PO TABS
0.5000 mg | ORAL_TABLET | Freq: Once | ORAL | Status: AC
  Administered 2014-12-15: 0.5 mg via ORAL
  Filled 2014-12-15: qty 1

## 2014-12-15 MED ORDER — MIRTAZAPINE 15 MG PO TABS
15.0000 mg | ORAL_TABLET | Freq: Every day | ORAL | Status: DC
  Administered 2014-12-15 – 2014-12-18 (×4): 15 mg via ORAL
  Filled 2014-12-15 (×4): qty 1

## 2014-12-15 MED ORDER — LORAZEPAM 2 MG/ML IJ SOLN: 1.0000 mg | INTRAMUSCULAR | Status: AC

## 2014-12-15 MED ORDER — CLONAZEPAM 0.5 MG PO TABS
0.5000 mg | ORAL_TABLET | Freq: Two times a day (BID) | ORAL | Status: DC
  Administered 2014-12-15 – 2014-12-19 (×9): 0.5 mg via ORAL
  Filled 2014-12-15 (×9): qty 1

## 2014-12-15 MED ORDER — OXYCODONE HCL 5 MG PO TABS
15.0000 mg | ORAL_TABLET | ORAL | Status: DC | PRN
  Administered 2014-12-15 – 2014-12-19 (×14): 15 mg via ORAL
  Filled 2014-12-15 (×15): qty 3

## 2014-12-15 NOTE — Progress Notes (Signed)
Patient called RN stating she was having burning with urination and she feels like "something is really, really wrong." Dr. Waymon Amato notified. New orders received.  Leanna Battles, RN.

## 2014-12-15 NOTE — Progress Notes (Signed)
Daily Rounding Note  12/15/2014, 8:40 AM  LOS: 6 days   SUBJECTIVE:       Yesterday used 10 mg IV Dilaudid, 5 mg oxycodone in addition to the scheduled/chronic 85 mg Methadone.  Pain still constant.   She reiterates the fact that pain in last few days is worse than at arrival last week.  Makes it very difficult to walk.  Ate very little yesterday.  No nausea, just not hungry.    OBJECTIVE:         Vital signs in last 24 hours:    Temp:  [97.9 F (36.6 C)-99.3 F (37.4 C)] 98.5 F (36.9 C) (10/06 0425) Pulse Rate:  [84-89] 85 (10/06 0425) Resp:  [18-22] 18 (10/06 0425) BP: (113-130)/(58-74) 123/73 mmHg (10/06 0425) SpO2:  [98 %-100 %] 98 % (10/06 0425) Weight:  [199 lb (90.266 kg)] 199 lb (90.266 kg) 12-21-22 2026) Last BM Date: 12/13/14 Filed Weights   12/12/14 1956 12/13/14 2033 12/21/14 2026  Weight: 205 lb 4 oz (93.1 kg) 199 lb (90.266 kg) 199 lb (90.266 kg)   General: looks more comfortable today.  No acutely ill looking   Heart: RRR Chest: clear bil Abdomen: hypoactive BS without tinkling or tympanitic sounds  Extremities: no CCE Neuro/Psych:  More relaxed.  Fully alert and oriented.  No limb weakness.   Intake/Output from previous day: 12-21-22 0701 - 10/06 0700 In: 600 [P.O.:600] Out: 800 [Urine:800]  Intake/Output this shift:    Lab Results:  Recent Labs  12/13/14 0715  WBC 4.8  HGB 11.6*  HCT 37.5  PLT 188    Ref. Range 12/10/2014 09:37 12/10/2014 16:10 12/15/2014 05:40  CRP Latest Ref Range: <1.0 mg/dL <0.5  <0.5    Ref. Range 12/10/2014 09:37  Sed Rate Latest Ref Range: 0-22 mm/hr 4    BMET No results for input(s): NA, K, CL, CO2, GLUCOSE, BUN, CREATININE, CALCIUM in the last 72 hours. LFT No results for input(s): PROT, ALBUMIN, AST, ALT, ALKPHOS, BILITOT, BILIDIR, IBILI in the last 72 hours. PT/INR No results for input(s): LABPROT, INR in the last 72 hours. Hepatitis Panel No results for  input(s): HEPBSAG, HCVAB, HEPAIGM, HEPBIGM in the last 72 hours.  Studies/Results: Dg Small Bowel  12/21/14   CLINICAL DATA:  ct demonstrating distal enteritis. biopsy via colonoscopy pending. History of hypercoagulable status with cerebral venous thrombosis.  EXAM: SMALL BOWEL SERIES  COMPARISON:  CT of 12/10/2014 and 11/14/2014.  TECHNIQUE: Following ingestion of thin barium, serial small bowel images were obtained including spot views of the terminal ileum.  FLUOROSCOPY TIME:  If the device does not provide the exposure index:  Fluoroscopy Time (in minutes and seconds):  0 minutes 7 seconds  Number of Acquired Images:  9  FINDINGS: Preprocedure scout film demonstrates normal caliber of the colon. No gross small bowel distention identified. Surgical clips in the left hemipelvis.  Small bowel follow-through images demonstrate normal caliber the stomach and duodenal C-loop. The jejunum is normal in caliber and fold pattern.  Mid small bowel loops are mildly dilated, including at 4.1 cm on the 4 hour film.  Corresponding to the CT abnormality, within the distal ileum, is an approximately 12 cm area of marked narrowing. Minimal nodularity identified within the mid and distal portion of the stricture. The terminal ileum is normal in caliber, but not well opacified.  IMPRESSION: 1. Distal ileal stricture, moderate length, moderate in severity. Favor Crohn disease versus less likely infectious enteritis.  Not a typical appearance or distribution of small bowel ischemia. 2. Mild mid small bowel dilatation, suggesting a component of low-grade obstruction.   Electronically Signed   By: Kyle  Talbot M.D.   On: 12/14/2014 15:19   Scheduled Meds: . apixaban  5 mg Oral BID  . FLUoxetine  20 mg Oral Daily  . gabapentin  100 mg Oral TID  . levETIRAcetam  500 mg Oral BID  . methadone  85 mg Oral Q breakfast  . polyethylene glycol  17 g Oral BID   Continuous Infusions:  PRN Meds:.acetaminophen **OR** acetaminophen,  HYDROmorphone (DILAUDID) injection, ondansetron **OR** ondansetron (ZOFRAN) IV, sodium phosphate   ASSESMENT:   * Recurrent acute on chronic abdominal pain in pt with focal ileitis and non-obstructive stricturing. ? Crohn's disease or sequelae of ischemic injury?. Path also suggested NSAID injury (pt never used). Sed Rate and CRP repeatedly normal.   11/18/2014 Colonoscopy with inflammation/ulceration 10 to 15 cm from IC valve. Pathology: chronic active ileitis. No response to steroids (subsequently discontinued) during 9/5 to 9/26 admission.  CRP, ESR consistently normal. ' SBFT 10/5: moderate distal ileal stricture with component of low-grade obstruction.  Frequently constipated. S/p SMOG enema 9/30. On BID Miralax. Linzess trialed during recent admission.  Surgery has no plans for surgery, feel pain is out of proprtion to findings and have signed off. Suggested transfer to tertiary care facility.  MRI/MRA abdomen ordered for today per palliative care.    * Hypercoagulable disorder. Hx CVA, hx PVT/sagital sinus thrombosis. Eliquis restarted 10/4  * Chronic narcotics since age 14 or 15, switched to Methadone within the last 12 months. Low pain tolerance.  Seen by palliative care today, defer to them for pain mgt.   PLAN   *  Pain mgt per palliative care.  Hospitalist, Dr Hongalgi, is looking into transfer to other area tertiary care facilities as [facility] has a long waiting list.   *  Check prealbumin, suspect protein calorie malnutrition as albumin is low and many days of inadequate po intake.   *  MRI/MRA to be done today.    Sarah Gribbin  12/15/2014, 8:40 AM Pager: [phone]  GI Attending Note  I have personally taken an interval history, reviewed the chart, and examined the patient.  I agree with the extender's note, impression and recommendations. Await results of MRI/MRA  Robert D. Kaplan, MD, FACG [facility] [phone]  

## 2014-12-15 NOTE — Progress Notes (Signed)
Addendum  Called [facility]: they are at peak census and hence unable to accept outside transfers at this time.  Vernell Leep, MD, FACP, FHM. [facility] Pager [phone]  If 7PM-7AM, please contact night-coverage [email or web address] Password [email or web address] 12/15/2014, 1:55 PM

## 2014-12-15 NOTE — Consult Note (Signed)
Consultation Note Date: 12/15/2014   Patient Name: Victoria Alvarez  DOB: 1990/09/04  MRN: [id number]  Age / Sex: 24 y.o., female   PCP: Dessa Phi, MD Referring Physician: Elease Etienne, MD  Reason for Consultation: Non pain symptom management, Pain control and Psychosocial/spiritual support  Palliative Care Assessment and Plan Summary of Established Goals of Care and Medical Treatment Preferences   Clinical Assessment/Narrative: Pt is a 24 yo female with now acute on chronic pain. Pt has had frequent hospitalizations for worsening pain, 4  hospitalizations in 6 months. She is currently on hydromorphone 1mg  iv q2 prn and is averaging using this every 2 hours around the clock. She is on methadone 85 mg daily as a maintenance dose for opioid addiction. [facility] [address] for daily maintennce dosing./ She has been on methadone for approx 1 year when she was switched from opioids to this when she became pregnant. She candidly states she recognizes in the past that she has used her pain medications to medicate other feelings. She has a long h/o chronic pain dating back to her teens, for interstitial cystitis. She had a c-section in May and maintains that her abd pain began at that time. She also states her pain is markedly worse over the past 3 days. It is located at umbilicus, does not radiate. She describes it in terms of cramping, stabbing and burning. She states the pain never goes away. Movement makes it worse. States she cannot walk. She is verbalizing hopelessness and helplessness. She denies SI. States she wants to get back to her children. Thoughts going forward: 1. Are we seeing hyperalgesia with hydromorphone? Reporting minimal relief with IV hydromorphone 2. Could an interventional pain specialist help with a block? 3. Management of associated psychosocial consequences of severe pain such as anxiety, depression and insomnia 4. Methadone  maintenance complicating issues with the pain relieving effects of methadone to not last as long as its half life. It would be more appropriate to TD dose and mange in a pain clinic 5. Further diagnostics, would they be helpful, such as MRI of abd and pelvis to r/o adhesions, ischemia  Contacts/Participants in Discussion: Primary Decision Maker: pt   HCPOA: no  Pt only present  Code Status/Advance Care Planning:  Full Code  Further advance planning at this point in 24 yo with new acute on chronic pain not indicated  Wants surgery if it would relieve pain  Needs therapy to deal with effects of chronic pain, ? H/o trauma  Symptom Management:   Pain: Will rotate dilaudid prn to oxycodone to see if we can r/o hyperalgesia. Reserve injectable dilaudid for acute option but encourage pt to take po oxycodone first line. Order interventional pain consult for further recommendation. MRI/MRA for further input re: adhesions, ischemia. Start Remeron 15 mg qhs to target GI receptors.   Anxiety r/t to unmanaged pain: Start klonopin .5 BID  Depression : DC Prozac. Has been on this in the past as high as 80 mg. Switch to Cymbalta 30 mg daily with goal of 60-90 mg. May up titrate to 60 mg in 1 week.   Palliative Prophylaxis: Increase bowel regimen. Cont miralax bid and will add senna 2 qhs. Pt reporting " normal to have 1 BM a week".  Additional Recommendations (Limitations, Scope, Preferences):  Pt still requesting transfer to [facility]. Dr. Waymon Amato following Psycho-social/Spiritual:   Support System: *limited  Desire for further Chaplaincy support:yes  Prognosis: Not discussed  Discharge  Planning:  Pt still hopeful for transfer to [facility]. Would like to see pt involved in a multidisciplinary pain mgt clnic      Chief Complaint/History of Present Illness: Pt is a 24 yo female re-admitted with c/o worsening abd pain.   Primary Diagnoses  Present on Admission:  . Abdominal pain . Anemia, iron  deficiency . Portal vein thrombosis  Palliative Review of Systems: Intense abd pain that she rates as 10/10 sometimes with assoc nausea. No dyspnea I have reviewed the medical record, interviewed the patient and family, and examined the patient. The following aspects are pertinent.  Past Medical History  Diagnosis Date  . Interstitial cystitis     pain from this was reason for starting opioids age 70.   . Chronic pelvic pain in female   . Headache(784.0)   . Pyelonephritis affecting pregnancy in first trimester July 2015  . Depression 2007  . Opioid dependence (HCC)     to Rx opioids.  switched to Methadone after second child born in 04/2014  . Pneumonia 03/2014  . Seizures (HCC)   . Stroke [facility]) 05/2014    presented with right sided weakness.   . Numbness     right side  . 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 Preeclampsia 2012  . Miscarriage 2005  . Non-compliance     with anticoagulant  . 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 Cerebral venous sinus thrombosis   . Morbid obesity (HCC)     BMI 37 in 11/2014.  [name] Anemia 07/2014    Microcytic  . Hypercoagulable state, primary (HCC) 07/2014    . no clear inherited or acquired cause for the patient's coagulopathy   Social History   Social History  . Marital Status: Single    Spouse Name: N/A  . Number of Children: 2  . Years of Education: 9   Occupational History  . homemaker    Social History Main Topics  . Smoking status: Current Every Day Smoker -- 0.50 packs/day for 8 years    Types: Cigarettes  . Smokeless tobacco: Never Used     Comment: cutting back  . Alcohol Use: No  . Drug Use: No     Comment: Pt on Methadone "was on pain pills; weaned off while I was pregnant so it would be safer"  . Sexual Activity: Yes    Birth Control/ Protection: None   Other Topics Concern  . None   Social History Narrative   Has significant other   Right handed   Caffeine use - none   Family History  Problem Relation Age of Onset  .  Diabetes Mother   . Heart disease Mother   . Hypertension Father   . Diabetes Maternal Grandmother   . Hypertension Maternal Grandmother   . Stroke Maternal Grandmother   . Diabetes Maternal Grandfather   . Hypertension Maternal Grandfather   . Diabetes Paternal Grandmother   . Diabetes Paternal Grandfather    Scheduled Meds: . apixaban  5 mg Oral BID  . FLUoxetine  20 mg Oral Daily  . gabapentin  100 mg Oral TID  . levETIRAcetam  500 mg Oral BID  . methadone  85 mg Oral Q breakfast  . polyethylene glycol  17 g Oral BID   Continuous Infusions:  PRN Meds:.acetaminophen **OR** acetaminophen, HYDROmorphone (DILAUDID) injection, ondansetron **OR** ondansetron (ZOFRAN) IV, sodium phosphate Medications Prior to Admission:  Prior to Admission medications   Medication Sig Start Date End Date Taking? Authorizing Provider  apixaban (  ELIQUIS) 5 MG TABS tablet Take 1 tablet (5 mg total) by mouth 2 (two) times daily. 10/16/14 02/03/15 Yes Leroy Sea, MD  EPINEPHrine 0.3 mg/0.3 mL IJ SOAJ injection Inject 0.3 mLs (0.3 mg total) into the muscle once. 06/16/14  Yes Jaclyn Shaggy, MD  FLUoxetine (PROZAC) 20 MG capsule Take 1 capsule (20 mg total) by mouth daily. 07/18/14  Yes Micki Riley, MD  levETIRAcetam (KEPPRA) 500 MG tablet Take 1 tablet (500 mg total) by mouth 2 (two) times daily. 06/13/14  Yes Richarda Overlie, MD  Linaclotide (LINZESS) 145 MCG CAPS capsule Take 1 capsule (145 mcg total) by mouth daily. 11/06/14  Yes Shanker Levora Dredge, MD  methadone (DOLOPHINE) 5 MG tablet Take 17 tablets (85 mg total) by mouth daily. Home medication continued Patient taking differently: Take 84 mg by mouth daily. Home medication continued. Patient states she takes 17 tablets every day per patient 10/16/14  Yes Leroy Sea, MD  nicotine (NICODERM CQ - DOSED IN MG/24 HOURS) 21 mg/24hr patch Place 1 patch (21 mg total) onto the skin daily. 11/29/14  Yes Rhetta Mura, MD  ondansetron (ZOFRAN ODT) 4 MG  disintegrating tablet Take 1 tablet (4 mg total) by mouth every 8 (eight) hours as needed for nausea or vomiting. 12/05/14  Yes Calvert Cantor, MD  oxyCODONE-acetaminophen (PERCOCET/ROXICET) 5-325 MG per tablet Take 2 tablets by mouth every 4 (four) hours as needed for moderate pain. 12/05/14  Yes Calvert Cantor, MD  docusate sodium (COLACE) 100 MG capsule Take 2 capsules (200 mg total) by mouth 2 (two) times daily. Patient not taking: Reported on 12/08/2014 12/05/14   Calvert Cantor, MD  polyethylene glycol (MIRALAX / GLYCOLAX) packet Take 17 g by mouth daily. Patient not taking: Reported on 12/08/2014 11/06/14   Maretta Bees, MD   Allergies  Allergen Reactions  . Bee Venom Swelling  . Ciprofloxacin Diarrhea and Nausea And Vomiting   CBC:    Component Value Date/Time   WBC 4.8 12/13/2014 0715   HGB 11.6* 12/13/2014 0715   HCT 37.5 12/13/2014 0715   PLT 188 12/13/2014 0715   MCV 79.1 12/13/2014 0715   NEUTROABS 2.4 12/09/2014 0412   LYMPHSABS 2.1 12/09/2014 0412   MONOABS 0.3 12/09/2014 0412   EOSABS 0.1 12/09/2014 0412   BASOSABS 0.0 12/09/2014 0412   Comprehensive Metabolic Panel:    Component Value Date/Time   NA 142 12/09/2014 0412   K 4.1 12/09/2014 0412   CL 109 12/09/2014 0412   CO2 25 12/09/2014 0412   BUN 6 12/09/2014 0412   CREATININE 0.81 12/09/2014 0412   CREATININE 0.81 07/12/2014 1717   GLUCOSE 96 12/09/2014 0412   CALCIUM 8.7* 12/09/2014 0412   AST 25 12/09/2014 0412   ALT 33 12/09/2014 0412   ALKPHOS 42 12/09/2014 0412   BILITOT 0.8 12/09/2014 0412   PROT 4.7* 12/09/2014 0412   ALBUMIN 3.0* 12/09/2014 0412    Physical Exam: Vital Signs: BP 123/73 mmHg  Pulse 85  Temp(Src) 98.5 F (36.9 C) (Oral)  Resp 18  Ht  (1.575 m)  Wt 90.266 kg (199 lb)  BMI 36.39 kg/m2  SpO2 98%  LMP  (Within Months) SpO2: SpO2: 98 % O2 Device: O2 Device: Not Delivered O2 Flow Rate:   Intake/output summary:  Intake/Output Summary (Last 24 hours) at 12/15/14 1020 Last  data filed at 12/15/14 1009  Gross per 24 hour  Intake    960 ml  Output    800 ml  Net  160 ml   LBM: Last BM Date: 12/13/14 Baseline Weight: Weight: 91.627 kg (202 lb) Most recent weight: Weight: 90.266 kg (199 lb)  Exam Findings:  General: Well nourished young female in severe pain; crying Resp: No work of breathing Cardiac: RRR, trace LE edema Psych: Labile, crying, hopeless         Palliative Performance Scale: 60-70%              Additional Data Reviewed: Recent Labs     12/13/14  0715  WBC  4.8  HGB  11.6*  PLT  188     Time In: 0900 Time Out: 1120 Time Total: 140 min Greater than 50%  of this time was spent counseling and coordinating care related to the above assessment and plan. Staffed with Dr. Phillips Odor and Dr. Waymon Amato  Signed by: Irean Hong, NP  Irean Hong, NP  12/15/2014, 10:20 AM  Please contact Palliative Medicine Team phone at [phone] for questions and concerns.

## 2014-12-15 NOTE — Progress Notes (Signed)
PROGRESS NOTE    Victoria Alvarez FWY:[id number] DOB: 02-05-1991 DOA: 12/08/2014 PCP: Minerva Ends, MD  Specialists: Dr.Pyrtle. Gastroenterologist.  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, depression, substance abuse, interstitial cystitis, hypercoagulable disorder, CVA, portal vein thrombosis diagnosed 09/2014 at which time Eliquis was started, well known to [place] who have evaluated her since July 2016 with CT abdomen and colonic inflammation was felt due to ischemic injury, admitted 8/24-8/28 for abdominal pain at which time CTA showed increased mesenteric stranding, lymphadenopathy in the region of long segment distal ileal thickening and focal terminal ileal distention-GI and surgery consulted and treated conservatively with antibiotics, readmitted 9/5-9/26 with similar GI symptoms at which time CT abdomen redemonstrated distal ileal enteritis, failed attempted colonoscopy 9/7 due to poor prep, successful colonoscopy 9/9 showed distal ileal stricture and active ileitis-pathology suggestive of IBD versus drug-induced/NSAID induced injury, steroids initiated without improvement, CTA 9/15 demonstrated 20 cm segment of distal ileal thickening-surgery did not want to perform resection until 2 weeks off of prednisone and recommended transfer to tertiary care facility and patient placed on waiting list at [facility], was not transferred and discharged home after she improved. Readmitted 12/05/14 with worsening of similar symptoms as prior to admission. [place] [facility] and general surgery has consulted and continue to follow and recommend transfer to a tertiary facility (on [facility] waiting list). Etiology of her abdominal pain is not entirely clear but her discomfort seems to be out of proportion with physical findings. Palliative medicine consulted on 10/5 for symptom management.   Assessment/Plan:   Acute on chronic  abdominal pain, nausea and vomiting - Complicated history with frequent hospitalizations recently - Etiology not fully clear but DD-Crohn's disease/IBD with distal ileal stricture, vascular/ischemia, doubt infectious, constipation contributing - Symptoms felt to be out of proportion with clinical findings or lab data. No evidence of obstruction. - Has undergone extensive recent evaluation as indicated above. Steroids have not helped in the past. Gabapentin started 10/4 - [facility] and general surgery consultation and follow-up appreciated. They recommend transfer to [facility] for further evaluation-on waiting list at [facility] has been calling daily for updates. - no plans for endoscopy Intervention or surgery at this time - Dr. Verlon Au performed speculum exam on 10/4 and added intravaginal Valium-did not help-DC. Urine pregnancy test negative - Palliative care consulted on 10/5 for symptom management-are seen patient now. - Small bowel series 10/5 shows distal ileal stricture favoring Crohn's versus less likely infectious enteritis. GI had consulted surgery for possible intervention. Discussed with surgical team 10/6: Do not plan surgery here and recommend transfer to tertiary center for surgery and high risk patient. - Repeat ESR and CRP normal.  Anemia of chronic disease/normocytic anemia  - Stable   Hypercoagulable disorder/portal vein & sagittal sinus thrombosis/history of CVA  - On IV heparin initially switched to Eliquis 10/4.  - Hematologist has recommended lifelong anticoagulation.   Chronic pain/methadone maintenance - Apparently has been on narcotics since mid teens for management of interstitial cystitis - Palliative care team meeting with patient regarding symptom management.  Seizure disorder - Continue Keppra  Interstitial cystitis - Dr. Verlon Au discussed with urology/Dr. Era Bumpers on 10/4 recommended no narcotics for interstitial cystitis but did recommend vaginal Valium  suppositories-did not help and hence will DC.  Tobacco abuse - Cessation counseled  Constipation - Complicating chronic narcotic use. May be contributing to her abdominal pain - Status post smog edema 9/30 and Fleet Enema  10/4 with effect   DVT prophylaxis: was IV heparin anticoagulation & switched to Eliquis 10/4 Code Status: Full Family Communication: none at bedside Disposition Plan:  awaiting transfer to [facility] when bed available.   Consultants:  General surgery  [facility]  Palliative care medicine  Procedures:  None  Antibiotics:   None  Subjective: "I have terrible pain" although appears quite comfortable and not in painful distress. Palliative care APP meeting with patient right now. As per nursing, nausea but no vomiting and no BM since yesterday.   Objective: Filed Vitals:   12/14/14 0945 12/14/14 1759 12/14/14 2026 12/15/14 0425  BP: 113/58 130/74 117/61 123/73  Pulse: 89 84 86 85  Temp: 99.3 F (37.4 C) 98 F (36.7 C) 97.9 F (36.6 C) 98.5 F (36.9 C)  TempSrc: Oral Oral    Resp: _0 Height:      Weight:   90.266 kg (199 lb)   SpO2: 100% 100% 100% 98%    Intake/Output Summary (Last 24 hours) at 12/15/14 0931 Last data filed at 12/15/14 0900  Gross per 24 hour  Intake    720 ml  Output    800 ml  Net    -80 ml   Filed Weights   12/12/14 1956 12/13/14 2033 12/14/14 2026  Weight: 93.1 kg (205 lb 4 oz) 90.266 kg (199 lb) 90.266 kg (199 lb)     Exam:  General exam: Moderately built and nourished pleasant young female lying comfortably supine in bed and talking to palliative team.  Respiratory system: Clear. No increased work of breathing. Cardiovascular system: S1 & S2 heard, RRR. No JVD, murmurs, gallops, clicks or pedal edema. Gastrointestinal system: Abdomen is nondistended, soft and nontender. Normal bowel sounds heard. Central nervous system: Alert and oriented. No focal neurological deficits. Extremities:  Symmetric 5 x 5 power.   Data Reviewed: Basic Metabolic Panel:  Recent Labs Lab 12/08/14 2115 12/09/14 0412  NA 134* 142  K 3.9 4.1  CL 101 109  CO2 25 25  GLUCOSE 98 96  BUN 9 6  CREATININE 0.77 0.81  CALCIUM 9.0 8.7*   Liver Function Tests:  Recent Labs Lab 12/08/14 2115 12/09/14 0412  AST 33 25  ALT 40 33  ALKPHOS 47 42  BILITOT 1.1 0.8  PROT 5.6* 4.7*  ALBUMIN 3.5 3.0*    Recent Labs Lab 12/08/14 2115  LIPASE 26   No results for input(s): AMMONIA in the last 168 hours. CBC:  Recent Labs Lab 12/08/14 2115 12/09/14 0412 12/10/14 0555 12/11/14 0343 12/12/14 0405 12/13/14 0715  WBC 7.0 4.9 4.2 4.0 4.1 4.8  NEUTROABS 4.3 2.4  --   --   --   --   HGB 11.9* 9.8* 9.9* 10.8* 9.9* 11.6*  HCT 38.0 31.9* 32.0* 33.9* 31.0* 37.5  MCV 79.3 79.2 79.4 78.7 78.7 79.1  PLT 237 203 187 175 185 188   Cardiac Enzymes: No results for input(s): CKTOTAL, CKMB, CKMBINDEX, TROPONINI in the last 168 hours. BNP (last 3 results) No results for input(s): PROBNP in the last 8760 hours. CBG:  Recent Labs Lab 12/12/14 2359 12/13/14 0541 12/13/14 0743 12/13/14 1255 12/13/14 1803  GLUCAP 92 104* 107* 87 86    No results found for this or any previous visit (from the past 240 hour(s)).         Studies: Dg Small Bowel  12/14/2014   CLINICAL DATA:  ct demonstrating distal enteritis. biopsy via colonoscopy pending. History of hypercoagulable  status with cerebral venous thrombosis.  EXAM: SMALL BOWEL SERIES  COMPARISON:  CT of 12/10/2014 and 11/14/2014.  TECHNIQUE: Following ingestion of thin barium, serial small bowel images were obtained including spot views of the terminal ileum.  FLUOROSCOPY TIME:  If the device does not provide the exposure index:  Fluoroscopy Time (in minutes and seconds):  0 minutes 7 seconds  Number of Acquired Images:  9  FINDINGS: Preprocedure scout film demonstrates normal caliber of the colon. No gross small bowel distention identified.  Surgical clips in the left hemipelvis.  Small bowel follow-through images demonstrate normal caliber the stomach and duodenal C-loop. The jejunum is normal in caliber and fold pattern.  Mid small bowel loops are mildly dilated, including at 4.1 cm on the 4 hour film.  Corresponding to the CT abnormality, within the distal ileum, is an approximately 12 cm area of marked narrowing. Minimal nodularity identified within the mid and distal portion of the stricture. The terminal ileum is normal in caliber, but not well opacified.  IMPRESSION: 1. Distal ileal stricture, moderate length, moderate in severity. Favor Crohn disease versus less likely infectious enteritis. Not a typical appearance or distribution of small bowel ischemia. 2. Mild mid small bowel dilatation, suggesting a component of low-grade obstruction.   Electronically Signed   By: Abigail Miyamoto M.D.   On: 12/14/2014 15:19        Scheduled Meds: . apixaban  5 mg Oral BID  . FLUoxetine  20 mg Oral Daily  . gabapentin  100 mg Oral TID  . levETIRAcetam  500 mg Oral BID  . methadone  85 mg Oral Q breakfast  . polyethylene glycol  17 g Oral BID   Continuous Infusions:   Active Problems:   Anemia, iron deficiency   Seizure (HCC)   Portal vein thrombosis   Abdominal pain   Generalized abdominal pain   Stricture of small intestine (HCC)    Time spent: 20 minutes     Noel Rodier, MD, FACP, FHM. [facility] Pager [phone]  If 7PM-7AM, please contact night-coverage [email or web address] Password [email or web address] 12/15/2014, 9:31 AM    LOS: 6 days

## 2014-12-16 MED ORDER — FENTANYL CITRATE (PF) 100 MCG/2ML IJ SOLN
50.0000 ug | INTRAMUSCULAR | Status: DC | PRN
  Administered 2014-12-16 – 2014-12-19 (×17): 50 ug via INTRAVENOUS
  Filled 2014-12-16 (×18): qty 2

## 2014-12-16 MED ORDER — SENNA 8.6 MG PO TABS
2.0000 | ORAL_TABLET | Freq: Two times a day (BID) | ORAL | Status: DC
  Administered 2014-12-16 – 2014-12-19 (×6): 17.2 mg via ORAL
  Filled 2014-12-16 (×6): qty 2

## 2014-12-16 MED ORDER — LORAZEPAM 0.5 MG PO TABS
0.5000 mg | ORAL_TABLET | Freq: Four times a day (QID) | ORAL | Status: DC | PRN
  Administered 2014-12-17 – 2014-12-18 (×2): 0.5 mg via ORAL
  Filled 2014-12-16 (×2): qty 1

## 2014-12-16 NOTE — Progress Notes (Signed)
Patient has been attempting to take PO oxycodone instead of IV dilaudid for pain control. Patient states the IV dilaudid gives more immediate relief, but the PO oxycodone gives longer lasting relief. States she feels like she has actually gotten some relief today and has been able to rest a little longer. She appears much more calm and relaxed than yesterday and this earlier today.  Leanna Battles, RN.

## 2014-12-16 NOTE — Progress Notes (Signed)
Daily Rounding Note  12/16/2014, 10:03 AM  LOS: 7 days   SUBJECTIVE:       C/o burning with urination.  U/A collected.  Abdominal pain continues.  Loose stool overnight.   OBJECTIVE:         Vital signs in last 24 hours:    Temp:  [97.5 F (36.4 C)-98.9 F (37.2 C)] 97.5 F (36.4 C) (10/07 0848) Pulse Rate:  [75-100] 75 (10/07 0848) Resp:  [17-18] 17 (10/07 0848) BP: (103-132)/(69-81) 103/69 mmHg (10/07 0848) SpO2:  [95 %-100 %] 97 % (10/07 0848) Weight:  [199 lb 4.7 oz (90.4 kg)] 199 lb 4.7 oz (90.4 kg) (10/06 2025) Last BM Date: 12/13/14 Filed Weights   12/13/14 2033 12-30-14 2026 12/15/14 2025  Weight: 199 lb (90.266 kg) 199 lb (90.266 kg) 199 lb 4.7 oz (90.4 kg)   General: looks less happy today.  More somnolent but very arouseable   Heart: RRR Chest: clear bil.  Abdomen: soft, diffusely tender to lightest touch  Extremities: no CCE Neuro/Psych:  Depressed, oriented x 3.   Intake/Output from previous day: 10/06 0701 - 10/07 0700 In: 1680 [P.O.:1680] Out: 1300 [Urine:1300]  Intake/Output this shift: Total I/O In: 0  Out: 400 [Urine:400]  Lab Results: No results for input(s): WBC, HGB, HCT, PLT in the last 72 hours. BMET No results for input(s): NA, K, CL, CO2, GLUCOSE, BUN, CREATININE, CALCIUM in the last 72 hours. LFT No results for input(s): PROT, ALBUMIN, AST, ALT, ALKPHOS, BILITOT, BILIDIR, IBILI in the last 72 hours. PT/INR No results for input(s): LABPROT, INR in the last 72 hours. Hepatitis Panel No results for input(s): HEPBSAG, HCVAB, HEPAIGM, HEPBIGM in the last 72 hours.  Studies/Results: Dg Small Bowel  12/30/14   CLINICAL DATA:  ct demonstrating distal enteritis. biopsy via colonoscopy pending. History of hypercoagulable status with cerebral venous thrombosis.  EXAM: SMALL BOWEL SERIES  COMPARISON:  CT of 12/10/2014 and 11/14/2014.  TECHNIQUE: Following ingestion of thin barium,  serial small bowel images were obtained including spot views of the terminal ileum.  FLUOROSCOPY TIME:  If the device does not provide the exposure index:  Fluoroscopy Time (in minutes and seconds):  0 minutes 7 seconds  Number of Acquired Images:  9  FINDINGS: Preprocedure scout film demonstrates normal caliber of the colon. No gross small bowel distention identified. Surgical clips in the left hemipelvis.  Small bowel follow-through images demonstrate normal caliber the stomach and duodenal C-loop. The jejunum is normal in caliber and fold pattern.  Mid small bowel loops are mildly dilated, including at 4.1 cm on the 4 hour film.  Corresponding to the CT abnormality, within the distal ileum, is an approximately 12 cm area of marked narrowing. Minimal nodularity identified within the mid and distal portion of the stricture. The terminal ileum is normal in caliber, but not well opacified.  IMPRESSION: 1. Distal ileal stricture, moderate length, moderate in severity. Favor Crohn disease versus less likely infectious enteritis. Not a typical appearance or distribution of small bowel ischemia. 2. Mild mid small bowel dilatation, suggesting a component of low-grade obstruction.   Electronically Signed   By: Abigail Miyamoto M.D.   On: December 30, 2014 15:19    ASSESMENT:   * Recurrent acute on chronic abdominal pain in pt with focal ileitis and non-obstructive stricturing. ? Crohn's disease or sequelae of ischemic injury?. Path also suggested NSAID injury (pt never used). Sed Rate and CRP repeatedly normal.   11/18/2014 Colonoscopy  with inflammation/ulceration 10 to 15 cm from IC valve. Pathology: chronic active ileitis. No response to steroids (subsequently discontinued) during 9/5 to 9/26 admission.  CRP, ESR consistently normal. ' SBFT 10/5: moderate distal ileal stricture with component of low-grade obstruction.  Frequently constipated. S/p SMOG enema 9/30. On BID Miralax. Linzess trialed during recent  admission.  Surgery has no plans for surgery, feel pain is out of proprtion to findings and have signed off. Suggested transfer to tertiary care facility.  MRI/MRA abdomen ordered 10/6 by pall care but not yet completed.   * Hypercoagulable disorder. Hx CVA, hx PVT/sagital sinus thrombosis. Eliquis restarted 10/4  * Chronic narcotics since age 44 or 72, switched to Methadone within the last 12 months. Low pain tolerance. Seen by palliative care 10/8, defer to them for pain mgt.  The fact that she is tender to minimal touch across entire abdomen supports diagnosis of functional component to her pain.    PLAN   *  Radiologist is questioning necessity of obtaining MRA as the CT angio performed 9/15 is superior to MRA in terms of vascular imaging.  Radiology has discontinued the order for the MRA. Pall care did not order plain MRI abdomen.  I passed all this info on to [facility] care team and they will decide on what imaging they want.  Suspect MRI will not add any new information to pt's diagnosis, but may be worth pursuing.   *  Dr Benson Norway is covering for weekend, call him if you need GI input.  At this point, GI has nothing to add to pt's mgt and will not continue to follow. Call us prn.   *  Awaiting word from [facility] if/when she can transfer.  [place] at [facility] and not accepting transfers.         Azucena Freed  12/16/2014, 10:03 AM Pager: [phone]

## 2014-12-16 NOTE — Progress Notes (Signed)
ANTICOAGULATION CONSULT NOTE - Follow Up Consult  Pharmacy Consult for Apixaban Indication: Hx portal vein thrombus, CVA  Allergies  Allergen Reactions  . Bee Venom Swelling  . Ciprofloxacin Diarrhea and Nausea And Vomiting    Patient Measurements: Height: 5\' 2"  (157.5 cm) Weight: 199 lb 4.7 oz (90.4 kg) IBW/kg (Calculated) : 50.1  Vital Signs: Temp: 98.2 F (36.8 C) (10/07 1019) Temp Source: Oral (10/07 1019) BP: 107/54 mmHg (10/07 1019) Pulse Rate: 86 (10/07 1019)  Labs: No results for input(s): HGB, HCT, PLT, APTT, LABPROT, INR, HEPARINUNFRC, CREATININE, CKTOTAL, CKMB, TROPONINI in the last 72 hours.  Estimated Creatinine Clearance: 111 mL/min (by C-G formula based on Cr of 0.81).   Assessment: 24 yo F admitted with worsening abd pain, N/V. Pt has chronic pelvic/abd pain and is on methadone for this. CT 9/15 showed ileitis. Colonoscopy 9/9 showed non-obs stricture. Her discomfort is out of proportion to her physical findings. MD suggesting tx or referral to tertiary care center.  The patient continues on apixaban for history of portal vein thrombosis and CVA. Dose remains appropriate. Last CBC on 10/4 was okay, Hgb 11.6, plts 188. No overt s/sx of bleeding noted. Dose remains appropriate  The patient has been re-educated on apixaban this admission.   Goal of Therapy:  Appropriate anticoagulation for indication and hepatic/renal function   Plan:  1. Continue apixaban 5 mg twice daily 2. Pharmacy will sign off of consult and monitor peripherally as no dose adjustments are expected at this time.   Georgina Pillion, PharmD, BCPS Clinical Pharmacist Pager: [phone] 12/16/2014 10:47 AM
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
## 2014-12-16 NOTE — Progress Notes (Signed)
Daily Progress Note   Patient Name: Victoria Alvarez       Date: 12/16/2014 DOB: 12/27/1990  Age: 24 y.o. MRN#: [id number] Attending Physician: Modena Jansky, MD Primary Care Physician: Minerva Ends, MD Admit Date: 12/08/2014  Reason for Consultation/Follow-up: Non pain symptom management, Pain control and Psychosocial/spiritual support  Subjective: Pt verbalizing some improvement in pain control. She has reported that it is better with oxycodone and has stated her pain level is a 5/10 at its best today. She is still verbalizing pain as high as a 10/10, and has used dilaudid iv for this pain. Movement is still making pain worse. Pt was able to stand today and attempted to wash up but this precipitated a pain crisis. MRA canceled today. Please Dr. Delanna Ahmadi note from today. Per chart review GI signed off. Pt still verbalizing desire to have surgery " to get my life back", and to transfer to [facility] if that is where she could have surgery. Still no bed available at [facility] per last report this am. She is frustrated and frightened that she has not received a dx or plan to manage "whatever is going on". She is also c/o chest pressure at times. Denies assoc dyspnea or n/v. No arm radiation, or jaw. The pain as been mild and intermittent. She does report feeling anxious with the pain and not knowing what is wrong with her. Interval Events: RX changes to address pain Length of Stay: 7 days  Current Medications: Scheduled Meds:  . apixaban  5 mg Oral BID  . clonazePAM  0.5 mg Oral BID  . DULoxetine  30 mg Oral Daily  . levETIRAcetam  500 mg Oral BID  . methadone  85 mg Oral Q breakfast  . mirtazapine  15 mg Oral QHS  . polyethylene glycol  17 g Oral BID  . senna  2 tablet Oral BID    Continuous Infusions:    PRN Meds: acetaminophen **OR** acetaminophen, fentaNYL (SUBLIMAZE) injection, ondansetron **OR** ondansetron (ZOFRAN) IV, oxyCODONE, sodium phosphate  Palliative  Performance Scale: 50-60%     Vital Signs: BP 107/54 mmHg  Pulse 86  Temp(Src) 98.2 F (36.8 C) (Oral)  Resp 16  Ht 5' 2"  (1.575 m)  Wt 90.4 kg (199 lb 4.7 oz)  BMI 36.44 kg/m2  SpO2 97%  LMP  (Within Months) SpO2: SpO2: 97 % O2 Device: O2 Device: Not Delivered O2 Flow Rate:    Intake/output summary:  Intake/Output Summary (Last 24 hours) at 12/16/14 1827 Last data filed at 12/16/14 1747  Gross per 24 hour  Intake   1800 ml  Output   1800 ml  Net      0 ml   LBM:   Baseline Weight: Weight: 91.627 kg (202 lb) Most recent weight: Weight: 90.4 kg (199 lb 4.7 oz)  Physical Exam: General: Well nourished young female. Calm, not in as much distress as when I met with her yesterday Resp: No work of breathing Psych: A/O x 3. Thought processes organized. Affect brighter. No SSI/HI. Pt is open to PO pain meds.               Additional Data Reviewed: No results for input(s): WBC, HGB, PLT, NA, BUN, CREATININE in the last 72 hours.  Invalid input(s): ALB   Problem List:  Patient Active Problem List   Diagnosis Date Noted  . Palliative care encounter   . Stricture of small intestine ([place])   . Abdominal pain 12/09/2014  . Generalized  abdominal pain   . Ileitis   . Lactic acidosis 11/14/2014  . Slow transit constipation   . Depression 11/03/2014  . Cerebral venous sinus thrombosis   . AP (abdominal pain)   . Portal vein thrombosis   . Pneumatosis of intestines 10/08/2014  . Chronic diarrhea 07/12/2014  . Methadone maintenance therapy patient ([place]) 07/12/2014  . Headache(784.0) 06/16/2014  . Headache 06/16/2014  . Bee sting allergy 06/16/2014  . Cerebral vein thrombosis 06/12/2014  . Cerebral venous infarction, acute ([place])   . Seizure ([place]) 06/11/2014  . Anemia, iron deficiency   . Tobacco abuse   . Asthma with acute exacerbation   . Thrombosis, superior sagittal sinus   . Cerebral venous thrombosis of cortical vein with infarction ([place])   . Right sided weakness     . Marijuana smoker ([place]) 03/01/2014  . Current smoker 03/01/2014     Palliative Care Assessment & Plan    Code Status:  Full code  Goals of Care:  n/a  Symptom Management:  Pain: Slight improvement. Will dc Hydromorphone to r/o hyperalgesia. Will order fentanyl 50 mcg q2 prn for acute, emergent pain, but will otherwise cont oxycodone 15 mg prn as first option.. Cont Cymbalta as well as Remeron  Anxiety: Slightly better. Cont klonopin .5 BID and will leave prn ativan for acute break thru anxiety If pt has MRI, she will need pre-medication order with ativan IV  Constipation: Improving LBM 12/16/14. Medium loose BM. Will increase senna to 2 tabs BID and cont miralax BID  Psycho-social/Spiritual:  Desire for further Chaplaincy support:no   Prognosis: n/a. Not focus of consult. Consult for pain mgt Discharge Planning: Still undetermined. Pt hopeful for resolution and answer to why she is in so much pain, even if this is surgery   Care plan was discussed with Dr. Algis Liming  Thank you for allowing the Palliative Medicine Team to assist in the care of this patient.   Time In: 1700 Time Out: 1740 Total Time 40 min Prolonged Time Billed  no     Greater than 50%  of this time was spent counseling and coordinating care related to the above assessment and plan.   Dory Horn, NP  12/16/2014, 6:27 PM  Please contact Palliative Medicine Team phone at [phone] for questions and concerns.

## 2014-12-16 NOTE — Progress Notes (Signed)
PROGRESS NOTE    Victoria Alvarez IOE:[id number] DOB: [birth year] DOA: 12/08/2014 PCP: Minerva Ends, MD  Specialists: Dr.Pyrtle. Gastroenterologist.  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, depression, substance abuse, interstitial cystitis, hypercoagulable disorder, CVA, portal vein thrombosis diagnosed 09/2014 at which time Eliquis was started, well known to [facility] who have evaluated her since July 2016 with CT abdomen and colonic inflammation was felt due to ischemic injury, admitted 8/24-8/28 for abdominal pain at which time CTA showed increased mesenteric stranding, lymphadenopathy in the region of long segment distal ileal thickening and focal terminal ileal distention-GI and surgery consulted and treated conservatively with antibiotics, readmitted 9/5-9/26 with similar GI symptoms at which time CT abdomen redemonstrated distal ileal enteritis, failed attempted colonoscopy 9/7 due to poor prep, successful colonoscopy 9/9 showed distal ileal stricture and active ileitis-pathology suggestive of IBD versus drug-induced/NSAID induced injury, steroids initiated without improvement, CTA 9/15 demonstrated 20 cm segment of distal ileal thickening-surgery did not want to perform resection until 2 weeks off of prednisone and recommended transfer to tertiary care facility and patient placed on waiting list at [facility], was not transferred and discharged home after she improved. Readmitted 12/05/14 with worsening of similar symptoms as prior to admission. [facility] general surgery has consulted and continue to follow and recommend transfer to a tertiary facility (on [facility] waiting list). Etiology of her abdominal pain is not entirely clear but her discomfort seems to be out of proportion with physical findings. Palliative medicine consulted on 10/5 for symptom management.   Assessment/Plan:   Acute on chronic  abdominal pain, nausea and vomiting - Complicated history with frequent hospitalizations recently - Etiology not fully clear but DD-Crohn's disease/IBD with distal ileal stricture, vascular/ischemia, doubt infectious, constipation contributing - Symptoms felt to be out of proportion with clinical findings or lab data. No evidence of obstruction. - Has undergone extensive recent evaluation as indicated above. Steroids have not helped in the past. Gabapentin started 10/4 - [facility] general surgery consultation and follow-up appreciated. They recommend transfer to [facility] for further evaluation-on waiting list at [facility] has been calling daily for updates. - no plans for endoscopy Intervention or surgery at this time - Dr. Verlon Au performed speculum exam on 10/4 and added intravaginal Valium-did not help-DC. Urine pregnancy test negative - Palliative care consulted on 10/5 for symptom management-are seen patient now. - Small bowel series 10/5 shows distal ileal stricture favoring Crohn's versus less likely infectious enteritis. GI had consulted surgery for possible intervention. Discussed with surgical team 10/6: Do not plan surgery here and recommend transfer to tertiary center for surgery and high risk patient. - Repeat ESR and CRP normal. - Called [facility] 10/6 and are unable to accept outside transfer secondary to their own peak census - [facility] 10/7: State that they still have no available beds to accept patient but indicate that the weekends are usually when beds become available and may be able to accept patient.   Anemia of chronic disease/normocytic anemia  - Stable   Hypercoagulable disorder/portal vein & sagittal sinus thrombosis/history of CVA  - On IV heparin initially switched to Eliquis 10/4.  - Hematologist has recommended lifelong anticoagulation.   Chronic pain/methadone maintenance - Apparently has been on narcotics since mid teens for management of interstitial  cystitis - Palliative care team input appreciated. Discussed extensively with palliative care team on 10/6 and 10/7.  Seizure disorder - Continue Keppra  Interstitial cystitis - Dr. Verlon Au discussed with urology/Dr. Era Bumpers on 10/4 recommended no narcotics for interstitial cystitis but did recommend vaginal Valium suppositories-did not help and hence will DC.  Tobacco abuse - Cessation counseled  Constipation - Complicating chronic narcotic use. May be contributing to her abdominal pain - Status post smog edema 9/30 and [employer] Enema 10/4 with effect   DVT prophylaxis: was IV heparin anticoagulation & switched to Eliquis 10/4 Code Status: Full Family Communication: none at bedside Disposition Plan:  awaiting transfer to [facility] when bed available.   Consultants:  General surgery   GI-signed off 10/7.  Palliative care medicine  Procedures:  None  Antibiotics:   None  Subjective: Continues with abdominal pain. Apparently had dysuria last night but urine microscopy unremarkable.   Objective: Filed Vitals:   12/15/14 2025 12/16/14 0430 12/16/14 0848 12/16/14 1019  BP: 122/81 117/69 103/69 107/54  Pulse: 94 76 75 86  Temp: 98.6 F (37 C) 97.9 F (36.6 C) 97.5 F (36.4 C) 98.2 F (36.8 C)  TempSrc: Oral Oral Oral Oral  Resp: 18 17 17 16   Height:      Weight: 90.4 kg (199 lb 4.7 oz)     SpO2: 95% 100% 97% 97%    Intake/Output Summary (Last 24 hours) at 12/16/14 1447 Last data filed at 12/16/14 1400  Gross per 24 hour  Intake   1680 ml  Output   1700 ml  Net    -20 ml   Filed Weights   12/13/14 2033 12/14/14 2026 12/15/14 2025  Weight: 90.266 kg (199 lb) 90.266 kg (199 lb) 90.4 kg (199 lb 4.7 oz)     Exam:  General exam: Moderately built and nourished pleasant young female lying comfortably supine.  Respiratory system: Clear. No increased work of breathing. Cardiovascular system: S1 & S2 heard, RRR. No JVD, murmurs, gallops, clicks  or pedal edema. Gastrointestinal system: Abdomen is nondistended, soft and nontender. Normal bowel sounds heard. Central nervous system: Alert and oriented. No focal neurological deficits. Extremities: Symmetric 5 x 5 power.   Data Reviewed: Basic Metabolic Panel: No results for input(s): NA, K, CL, CO2, GLUCOSE, BUN, CREATININE, CALCIUM, MG, PHOS in the last 168 hours. Liver Function Tests: No results for input(s): AST, ALT, ALKPHOS, BILITOT, PROT, ALBUMIN in the last 168 hours. No results for input(s): LIPASE, AMYLASE in the last 168 hours. No results for input(s): AMMONIA in the last 168 hours. CBC:  Recent Labs Lab 12/10/14 0555 12/11/14 0343 12/12/14 0405 12/13/14 0715  WBC 4.2 4.0 4.1 4.8  HGB 9.9* 10.8* 9.9* 11.6*  HCT 32.0* 33.9* 31.0* 37.5  MCV 79.4 78.7 78.7 79.1  PLT 187 175 185 188   Cardiac Enzymes: No results for input(s): CKTOTAL, CKMB, CKMBINDEX, TROPONINI in the last 168 hours. BNP (last 3 results) No results for input(s): PROBNP in the last 8760 hours. CBG:  Recent Labs Lab 12/12/14 2359 12/13/14 0541 12/13/14 0743 12/13/14 1255 12/13/14 1803  GLUCAP 92 104* 107* 87 86    No results found for this or any previous visit (from the past 240 hour(s)).         Studies: No results found.      Scheduled Meds: . apixaban  5 mg Oral BID  . clonazePAM  0.5 mg Oral BID  . DULoxetine  30 mg Oral Daily  . levETIRAcetam  500 mg Oral BID  . methadone  85 mg Oral Q breakfast  . mirtazapine  15 mg Oral QHS  .  polyethylene glycol  17 g Oral BID  . senna  2 tablet Oral QHS   Continuous Infusions:   Active Problems:   Anemia, iron deficiency   Seizure (HCC)   Portal vein thrombosis   Abdominal pain   Generalized abdominal pain   Stricture of small intestine (HCC)   Palliative care encounter    Time spent: 20 minutes     HONGALGI,ANAND, MD, FACP, FHM. [facility] Pager [phone]  If 7PM-7AM, please contact  night-coverage [email or web address] Password [email or web address] 12/16/2014, 2:47 PM    LOS: 7 days

## 2014-12-17 LAB — URINE CULTURE: Culture: >=100000

## 2014-12-17 NOTE — Progress Notes (Signed)
PROGRESS NOTE    Victoria Alvarez KWI:[id number] DOB: [birth year] DOA: 12/08/2014 PCP: Minerva Ends, MD  Specialists: Dr.Pyrtle. Gastroenterologist.  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, depression, substance abuse, interstitial cystitis, hypercoagulable disorder, CVA, portal vein thrombosis diagnosed 09/2014 at which time Eliquis was started, well known to [facility] who have evaluated her since July 2016 with CT abdomen and colonic inflammation was felt due to ischemic injury, admitted 8/24-8/28 for abdominal pain at which time CTA showed increased mesenteric stranding, lymphadenopathy in the region of long segment distal ileal thickening and focal terminal ileal distention-GI and surgery consulted and treated conservatively with antibiotics, readmitted 9/5-9/26 with similar GI symptoms at which time CT abdomen redemonstrated distal ileal enteritis, failed attempted colonoscopy 9/7 due to poor prep, successful colonoscopy 9/9 showed distal ileal stricture and active ileitis-pathology suggestive of IBD versus drug-induced/NSAID induced injury, steroids initiated without improvement, CTA 9/15 demonstrated 20 cm segment of distal ileal thickening-surgery did not want to perform resection until 2 weeks off of prednisone and recommended transfer to tertiary care facility and patient placed on waiting list at [facility], was not transferred and discharged home after she improved. Readmitted 12/05/14 with worsening of similar symptoms as prior to admission. [facility] and general surgery has consulted and continue to follow and recommend transfer to a tertiary facility (on [facility] waiting list). Etiology of her abdominal pain is not entirely clear but her discomfort seems to be out of proportion with physical findings. Palliative medicine consulted on 10/5 for symptom management.   Assessment/Plan:   Acute on chronic  abdominal pain, nausea and vomiting - Complicated history with frequent hospitalizations recently - Etiology not fully clear but DD-Crohn's disease/IBD with distal ileal stricture, vascular/ischemia, doubt infectious, constipation contributing - Symptoms felt to be out of proportion with clinical findings or lab data. No evidence of obstruction. - Has undergone extensive recent evaluation as indicated above. Steroids have not helped in the past. Gabapentin started 10/4 - [facility] general surgery consultation and follow-up appreciated. They recommend transfer to [facility] for further evaluation-on waiting list at [facility] has been calling daily for updates. - no plans for endoscopy Intervention or surgery at this time - Dr. Verlon Au performed speculum exam on 10/4 and added intravaginal Valium-did not help-DC. Urine pregnancy test negative - Palliative care consulted on 10/5 for symptom management-adjusting medications and pain slightly better controlled. - Small bowel series 10/5 shows distal ileal stricture favoring Crohn's versus less likely infectious enteritis. GI had consulted surgery for possible intervention. Discussed with surgical team 10/6: Do not plan surgery here and recommend transfer to tertiary center for surgery and high risk patient. - Repeat ESR and CRP normal. - Called [facility] 10/6 and are unable to accept outside transfer secondary to their own peak census - [facility] 10/7: State that they still have no available beds to accept patient but indicate that the weekends are usually when beds become available and may be able to accept patient.   Anemia of chronic disease/normocytic anemia  - Stable   Hypercoagulable disorder/portal vein & sagittal sinus thrombosis/history of CVA  - On IV heparin initially switched to Eliquis 10/4.  - Hematologist has recommended lifelong anticoagulation.   Chronic pain/methadone maintenance - Apparently has been on narcotics since mid  teens for management of interstitial cystitis - Palliative care team input appreciated. Pain seems to be better controlled.  Seizure disorder - Continue Keppra  Interstitial cystitis - Dr. Verlon Au discussed with urology/Dr. Era Bumpers on 10/4 recommended no narcotics for interstitial cystitis but did recommend vaginal Valium suppositories-did not help and hence will DC.  Tobacco abuse - Cessation counseled  Constipation - Complicating chronic narcotic use. May be contributing to her abdominal pain - Having BMs.   DVT prophylaxis: was IV heparin anticoagulation & switched to Eliquis 10/4 Code Status: Full Family Communication: none at bedside Disposition Plan:  awaiting transfer to [facility] when bed available.   Consultants:  General surgery  [facility]-signed off 10/7.  Palliative care medicine  Procedures:  None  Antibiotics:   None  Subjective: States that abdominal pain is better than it was a couple days ago-comes down to about 7/10 in severity. Nausea without vomiting. Having BMs.   Objective: Filed Vitals:   12/16/14 1019 12/16/14 2045 12/17/14 0500 12/17/14 0830  BP: 107/54 98/68 96/59  103/61  Pulse: 86 89 82 84  Temp: 98.2 F (36.8 C) 98.1 F (36.7 C) 97.7 F (36.5 C) 98.6 F (37 C)  TempSrc: Oral Oral Oral Oral  Resp: 16 18 18 20   Height:      Weight:  91.309 kg (201 lb 4.8 oz)    SpO2: 97% 97% 99% 99%    Intake/Output Summary (Last 24 hours) at 12/17/14 1222 Last data filed at 12/17/14 1031  Gross per 24 hour  Intake   1180 ml  Output    950 ml  Net    230 ml   Filed Weights   12/14/14 2026 12/15/14 2025 12/16/14 2045  Weight: 90.266 kg (199 lb) 90.4 kg (199 lb 4.7 oz) 91.309 kg (201 lb 4.8 oz)     Exam:  General exam: Moderately built and nourished pleasant young female sitting up comfortably in bed and does not appear in any kind of distress. Respiratory system: Clear. No increased work of breathing. Cardiovascular system:  S1 & S2 heard, RRR. No JVD, murmurs, gallops, clicks or pedal edema. Gastrointestinal system: Abdomen is nondistended, soft and nontender. Normal bowel sounds heard. Central nervous system: Alert and oriented. No focal neurological deficits. Extremities: Symmetric 5 x 5 power.   Data Reviewed: Basic Metabolic Panel: No results for input(s): NA, K, CL, CO2, GLUCOSE, BUN, CREATININE, CALCIUM, MG, PHOS in the last 168 hours. Liver Function Tests: No results for input(s): AST, ALT, ALKPHOS, BILITOT, PROT, ALBUMIN in the last 168 hours. No results for input(s): LIPASE, AMYLASE in the last 168 hours. No results for input(s): AMMONIA in the last 168 hours. CBC:  Recent Labs Lab 12/11/14 0343 12/12/14 0405 12/13/14 0715  WBC 4.0 4.1 4.8  HGB 10.8* 9.9* 11.6*  HCT 33.9* 31.0* 37.5  MCV 78.7 78.7 79.1  PLT 175 185 188   Cardiac Enzymes: No results for input(s): CKTOTAL, CKMB, CKMBINDEX, TROPONINI in the last 168 hours. BNP (last 3 results) No results for input(s): PROBNP in the last 8760 hours. CBG:  Recent Labs Lab 12/12/14 2359 12/13/14 0541 12/13/14 0743 12/13/14 1255 12/13/14 1803  GLUCAP 92 104* 107* 87 86    No results found for this or any previous visit (from the past 240 hour(s)).         Studies: No results found.      Scheduled Meds: . apixaban  5 mg Oral BID  . clonazePAM  0.5 mg Oral BID  . DULoxetine  30 mg Oral Daily  . levETIRAcetam  500 mg Oral BID  . methadone  85 mg Oral Q breakfast  .  mirtazapine  15 mg Oral QHS  . polyethylene glycol  17 g Oral BID  . senna  2 tablet Oral BID   Continuous Infusions:   Active Problems:   Anemia, iron deficiency   Seizure (HCC)   Portal vein thrombosis   Abdominal pain   Generalized abdominal pain   Stricture of small intestine (HCC)   Palliative care encounter    Time spent: 20 minutes     Corri Delapaz, MD, FACP, FHM. [facility] Pager [phone]  If 7PM-7AM, please contact  night-coverage [email or web address] Password [email or web address] 12/17/2014, 12:22 PM    LOS: 8 days

## 2014-12-18 NOTE — Progress Notes (Signed)
PROGRESS NOTE    Victoria Alvarez TML:[id number] DOB: 10-02-1990 DOA: 12/08/2014 PCP: Minerva Ends, MD  Specialists: Dr.Pyrtle. Gastroenterologist.  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, depression, substance abuse, interstitial cystitis, hypercoagulable disorder, CVA, portal vein thrombosis diagnosed 09/2014 at which time Eliquis was started, well known to [place] who have evaluated her since July 2016 with CT abdomen and colonic inflammation was felt due to ischemic injury, admitted 8/24-8/28 for abdominal pain at which time CTA showed increased mesenteric stranding, lymphadenopathy in the region of long segment distal ileal thickening and focal terminal ileal distention-GI and surgery consulted and treated conservatively with antibiotics, readmitted 9/5-9/26 with similar GI symptoms at which time CT abdomen redemonstrated distal ileal enteritis, failed attempted colonoscopy 9/7 due to poor prep, successful colonoscopy 9/9 showed distal ileal stricture and active ileitis-pathology suggestive of IBD versus drug-induced/NSAID induced injury, steroids initiated without improvement, CTA 9/15 demonstrated 20 cm segment of distal ileal thickening-surgery did not want to perform resection until 2 weeks off of prednisone and recommended transfer to tertiary care facility and patient placed on waiting list at [facility], was not transferred and discharged home after she improved. Readmitted 12/05/14 with worsening of similar symptoms as prior to admission. [facility] surgery has consulted and continue to follow and recommend transfer to a tertiary facility (on [facility] waiting list). Etiology of her abdominal pain is not entirely clear but her discomfort seems to be out of proportion with physical findings. Palliative medicine consulted on 10/5 for symptom management. Pain continues to gradually improve. Unable  to arrange transfer at tertiary hospitals thus far.  Assessment/Plan:   Acute on chronic abdominal pain, nausea and vomiting - Complicated history with frequent hospitalizations recently - Etiology not fully clear but DD-Crohn's disease/IBD with distal ileal stricture, vascular/ischemia, doubt infectious, constipation contributing - Symptoms felt to be out of proportion with clinical findings or lab data. No evidence of obstruction. - Has undergone extensive recent evaluation as indicated above. Steroids have not helped in the past. Gabapentin started 10/4 - [facility] general surgery consultation and follow-up appreciated. They recommend transfer to [facility] for further evaluation-on waiting list at [facility] has been calling daily for updates. - no plans for endoscopy Intervention or surgery at this time - Dr. Verlon Au performed speculum exam on 10/4 and added intravaginal Valium-did not help-DC. Urine pregnancy test negative - Palliative care consulted on 10/5 for symptom management-adjusting medications and pain slightly better controlled. - Small bowel series 10/5 shows distal ileal stricture favoring Crohn's versus less likely infectious enteritis. GI had consulted surgery for possible intervention. Discussed with surgical team 10/6: Do not plan surgery here and recommend transfer to tertiary center for surgery and high risk patient. - Repeat ESR and CRP normal. - Called [facility] 10/6 and are unable to accept outside transfer secondary to their own peak census - [facility] 10/7: State that they still have no available beds to accept patient but indicate that the weekends are usually when beds become available and may be able to accept patient. - Abdominal pain seems to be gradually improving. Over the last 48 hours, on my visit, she has seemed quite comfortable and in no obvious distress. An option may be to discharge home when pain reasonably controlled and which she can manage with  oral pain meds and outpatient follow-up at [facility] or [facility]. She feels this plan is reasonable and  states that she will attempt to move around more today and see how she feels. Unclear etiology of Lactobacillus found on urine culture results. Urine microscopy was unimpressive. She has not had fever or leukocytosis.   Anemia of chronic disease/normocytic anemia  - Stable   Hypercoagulable disorder/portal vein & sagittal sinus thrombosis/history of CVA  - On IV heparin initially switched to Eliquis 10/4.  - Hematologist has recommended lifelong anticoagulation.   Chronic pain/methadone maintenance - Apparently has been on narcotics since mid teens for management of interstitial cystitis - Palliative care team input appreciated. Pain seems to be better controlled.  Seizure disorder - Continue Keppra  Interstitial cystitis - Dr. Verlon Au discussed with urology/Dr. Era Bumpers on 10/4 recommended no narcotics for interstitial cystitis but did recommend vaginal Valium suppositories-did not help and hence will DC.  Tobacco abuse - Cessation counseled  Constipation - Complicating chronic narcotic use. May be contributing to her abdominal pain - Having BMs.   DVT prophylaxis: was IV heparin anticoagulation & switched to Eliquis 10/4 Code Status: Full Family Communication: none at bedside Disposition Plan:  awaiting transfer to [facility] when bed available.   Consultants:  General surgery  [facility]-signed off 10/7.  Palliative care medicine  Procedures:  None  Antibiotics:   None  Subjective: Tolerating soft diet. Having BMs. Abdominal pain gradually but surely improving.   Objective: Filed Vitals:   12/17/14 1946 12/18/14 0427 12/18/14 1000 12/18/14 1002  BP: 122/87 119/75 107/57   Pulse: 99 102 85   Temp: 98.4 F (36.9 C) 98.5 F (36.9 C) 97.6 F (36.4 C) 97.6 F (36.4 C)  TempSrc:   Oral   Resp: 20 22 20    Height:      Weight: 89.359  kg (197 lb)     SpO2: 100% 94% 99%     Intake/Output Summary (Last 24 hours) at 12/18/14 1140 Last data filed at 12/18/14 0900  Gross per 24 hour  Intake    960 ml  Output      0 ml  Net    960 ml   Filed Weights   12/15/14 2025 12/16/14 2045 12/17/14 1946  Weight: 90.4 kg (199 lb 4.7 oz) 91.309 kg (201 lb 4.8 oz) 89.359 kg (197 lb)     Exam:  General exam: Moderately built and nourished pleasant young female sitting up comfortably in bed and does not appear in any kind of distress. Has not looked in any distress over the last 48 hours at least. Respiratory system: Clear. No increased work of breathing. Cardiovascular system: S1 & S2 heard, RRR. No JVD, murmurs, gallops, clicks or pedal edema. Gastrointestinal system: Abdomen is nondistended, soft and nontender. Normal bowel sounds heard. Central nervous system: Alert and oriented. No focal neurological deficits. Extremities: Symmetric 5 x 5 power.   Data Reviewed: Basic Metabolic Panel: No results for input(s): NA, K, CL, CO2, GLUCOSE, BUN, CREATININE, CALCIUM, MG, PHOS in the last 168 hours. Liver Function Tests: No results for input(s): AST, ALT, ALKPHOS, BILITOT, PROT, ALBUMIN in the last 168 hours. No results for input(s): LIPASE, AMYLASE in the last 168 hours. No results for input(s): AMMONIA in the last 168 hours. CBC:  Recent Labs Lab 12/12/14 0405 12/13/14 0715  WBC 4.1 4.8  HGB 9.9* 11.6*  HCT 31.0* 37.5  MCV 78.7 79.1  PLT 185 188   Cardiac Enzymes: No results for input(s): CKTOTAL, CKMB, CKMBINDEX, TROPONINI in the last 168 hours. BNP (last 3 results) No results for input(s): PROBNP  in the last 8760 hours. CBG:  Recent Labs Lab 12/12/14 2359 12/13/14 0541 12/13/14 0743 12/13/14 1255 12/13/14 1803  GLUCAP 92 104* 107* 87 86    Recent Results (from the past 240 hour(s))  Culture, Urine     Status: None   Collection Time: 12/15/14 11:44 PM  Result Value Ref Range Status   Specimen Description  URINE, CLEAN CATCH  Final   Special Requests NONE  Final   Culture >=100,000 COLONIES/mL LACTOBACILLUS SPECIES  Final   Report Status 12/17/2014 FINAL  Final           Studies: No results found.      Scheduled Meds: . apixaban  5 mg Oral BID  . clonazePAM  0.5 mg Oral BID  . DULoxetine  30 mg Oral Daily  . levETIRAcetam  500 mg Oral BID  . methadone  85 mg Oral Q breakfast  . mirtazapine  15 mg Oral QHS  . polyethylene glycol  17 g Oral BID  . senna  2 tablet Oral BID   Continuous Infusions:   Active Problems:   Anemia, iron deficiency   Seizure (HCC)   Portal vein thrombosis   Abdominal pain   Generalized abdominal pain   Stricture of small intestine (HCC)   Palliative care encounter    Time spent: 20 minutes     Anzal Bartnick, MD, FACP, FHM. [facility] Pager [phone]  If 7PM-7AM, please contact night-coverage [email or web address] Password [email or web address] 12/18/2014, 11:40 AM    LOS: 9 days

## 2014-12-18 NOTE — Progress Notes (Addendum)
Daily Progress Note   Patient Name: Victoria Alvarez       Date: 12/18/2014 DOB: 1990-06-09  Age: 24 y.o. MRN#: [id number] Attending Physician: Elease Etienne, MD Primary Care Physician: Lora Paula, MD Admit Date: 12/08/2014  Reason for Consultation/Follow-up: Non pain symptom management, Pain control and Psychosocial/spiritual support  Subjective: Pt using fewer prn's, reporting improvement in pain control for abd distress. She is still having sudden onset of grabbing pain to her RLQ whenever she moves. She is less overwhelmed most of the day but still having moments of tearfulness, fear. She is frightened about what is the underlying cause of her distress, and the waiting is wearing on her. She is still verbalizing wanting to go further in terms of even surgery if indicated to resolve her pain. She has been consistently clear that this is new , acute pain very different from her interstial cystitis. She consistently reports pain beginning after birth fo her son in May via c-section and has progressively worsened, even after admission here.  See Dr. Richardean Chimera note 10/8 for good summary of clinical data, imaging, collected during this admission.  She also tells me tonight that she has not had a period in 3 months which is very unusual for her. Normally she is very regular  Interval Events: none Length of Stay: 9 days  Current Medications: Scheduled Meds:  . apixaban  5 mg Oral BID  . clonazePAM  0.5 mg Oral BID  . DULoxetine  30 mg Oral Daily  . levETIRAcetam  500 mg Oral BID  . methadone  85 mg Oral Q breakfast  . mirtazapine  15 mg Oral QHS  . polyethylene glycol  17 g Oral BID  . senna  2 tablet Oral BID    Continuous Infusions:    PRN Meds: acetaminophen **OR** acetaminophen, fentaNYL (SUBLIMAZE) injection, LORazepam, ondansetron **OR** ondansetron (ZOFRAN) IV, oxyCODONE, sodium phosphate  Palliative Performance Scale: 60%     Vital Signs: BP 119/75 mmHg  Pulse  102  Temp(Src) 98.5 F (36.9 C) (Oral)  Resp 22  Ht  (1.575 m)  Wt 89.359 kg (197 lb)  BMI 36.02 kg/m2  SpO2 94%  LMP  (Within Months) SpO2: SpO2: 94 % O2 Device: O2 Device: Not Delivered O2 Flow Rate:    Intake/output summary:  Intake/Output Summary (Last 24 hours) at 12/18/14 0846 Last data filed at 12/17/14 2140  Gross per 24 hour  Intake    960 ml  Output      0 ml  Net    960 ml   LBM:   Baseline Weight: Weight: 91.627 kg (202 lb) Most recent weight: Weight: 89.359 kg (197 lb)  Physical Exam: General: Well nourished female. Alert , in severe pain  Resp: No work of breathing Psych: A/O x4, thought processes organized, affect tearful, mood anxious. No SI, HI. Verbalizes her children need her and despite feeling as though she wantedFull to give up she would not do that because of her children               Additional Data Reviewed: No results for input(s): WBC, HGB, PLT, NA, BUN, CREATININE in the last 72 hours.  Invalid input(s): ALB   Problem List:  Patient Active Problem List   Diagnosis Date Noted  . Palliative care encounter   . Stricture of small intestine (HCC)   . Abdominal pain 12/09/2014  . Generalized abdominal pain   . Ileitis   . Lactic acidosis 11/14/2014  .  Slow transit constipation   . Depression 11/03/2014  . Cerebral venous sinus thrombosis   . AP (abdominal pain)   . Portal vein thrombosis   . Pneumatosis of intestines 10/08/2014  . Chronic diarrhea 07/12/2014  . 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 Headache(784.0) 06/16/2014  . Headache 06/16/2014  . Bee sting allergy 06/16/2014  . Cerebral vein thrombosis 06/12/2014  . Cerebral venous infarction, acute (HCC)   . Seizure (HCC) 06/11/2014  . Anemia, iron deficiency   . Tobacco abuse   . Asthma with acute exacerbation   . Thrombosis, superior sagittal sinus   . 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 Right sided weakness   .  Marijuana smoker (HCC) 03/01/2014  . Current smoker 03/01/2014     Palliative Care Assessment & Plan    Code Status:  Full code  Goals of Care:  Resolution of abd pain  Transfer to tertiary hospital if necessary to pursue surgery if needed to resolve issue  Symptom Management:  Pain: Cont Fentanyl for break thru acute pain, and oxycodone prn po otherwise. Pt is utilizing PO med and does not seem to be focused only on injections.   Depression. Tolerating Cymbalta well. Recommend further tiration to 60 mg next week, Wed., cont Remeron at HS. Pt states she slept well. Remeron also targets serotonin receptors in GI tract and could provide supportive relief  Anxiety: Improving with better pain control and klonopin. Cont klonopin 0.5 BID and ativan PO for breakthrough       She would need ativan IV if MRI ordered before test  Palliative Prophylaxis:  Bowel: Improving but she is now reporting night time bowel urgency to the point of incontinence. This is not happening during the day. LBM 10/8  Psycho-social/Spiritual:  Desire for further Chaplaincy support:no   Prognosis: n/a Discharge Planning: TBD. Tertiary hospitalization if transfer can be arranged.    Care plan was discussed with Dr. Linna Darner. Question if GYN consult would be helpful, transvaginal US  Thank you for allowing the Palliative Medicine Team to assist in the care of this patient.   Time In: 1800 Time Out: 1900 Total Time 60 min Prolonged Time Billed  no     Greater than 50%  of this time was spent counseling and coordinating care related to the above assessment and plan.   Irean Hong, NP  12/18/2014, 8:46 AM  Please contact Palliative Medicine Team phone at [phone] for questions and concerns.

## 2014-12-19 MED ORDER — POLYETHYLENE GLYCOL 3350 17 G PO PACK: 17.0000 g | PACK | Freq: Two times a day (BID) | ORAL | Status: DC

## 2014-12-19 MED ORDER — SENNA 8.6 MG PO TABS: 2.0000 | ORAL_TABLET | Freq: Two times a day (BID) | ORAL | Status: DC

## 2014-12-19 MED ORDER — CLONAZEPAM 0.5 MG PO TABS: 0.5000 mg | ORAL_TABLET | Freq: Two times a day (BID) | ORAL | Status: DC

## 2014-12-19 MED ORDER — DULOXETINE HCL 30 MG PO CPEP: 30.0000 mg | ORAL_CAPSULE | Freq: Every day | ORAL | Status: DC

## 2014-12-19 MED ORDER — OXYCODONE HCL 15 MG PO TABS: 15.0000 mg | ORAL_TABLET | ORAL | Status: DC | PRN

## 2014-12-19 MED ORDER — FLEET ENEMA 7-19 GM/118ML RE ENEM: 1.0000 | ENEMA | Freq: Every day | RECTAL | Status: DC | PRN

## 2014-12-19 MED ORDER — FENTANYL CITRATE (PF) 100 MCG/2ML IJ SOLN: 50.0000 ug | INTRAMUSCULAR | Status: DC | PRN

## 2014-12-19 MED ORDER — MIRTAZAPINE 15 MG PO TABS: 15.0000 mg | ORAL_TABLET | Freq: Every day | ORAL | Status: DC

## 2014-12-19 MED ORDER — LORAZEPAM 0.5 MG PO TABS: 0.5000 mg | ORAL_TABLET | Freq: Four times a day (QID) | ORAL | Status: DC | PRN

## 2014-12-19 NOTE — Progress Notes (Deleted)
Wasted 10 ml = 3 mg in sink witnessed by Carita Pian, RN charge nurse.

## 2014-12-19 NOTE — Progress Notes (Signed)
Spoke with CCS regarding patient's status and condition. I have requested re-evaluation since there is no bed available at [facility] and that has been the case now for several days. Reason documented on previous evaluationsfor this referral is complicated surgery given her history of thrombotic dx, 5 month post-partum and probably her chronic pain situation "pain out of proportion". The palliative team has been working with her on pain management and have made improvements in this without addressing the underlying issues causing the pain. She has no signs of "drug seeking" behavior, misuse or abuse. She has no unusual activity on her [place] controlled substance database report and we have verified she is adherent and compliant in there methadone treatment program. She is on methadone for management of opioid dependence after a long history of prescribed opioids for chronic interstitial cystitis pain which complicates her pain management in general.  We are recommending surgical re-evaluation and management of what is likely Chrohn's ilitis and ulceration with stricture that has largely failed medical management with GI closely coordinating her care. Ct angiogram was negative for vascular ischemia about a month ago. She has been off of steroids for >2 weeks in anticipation of surgical intervention but the underlying condition remains the same. Without definitive management she is high risk for readmission and potentially serious complications.  Appreciate the medical and surgical teams consideration of her complex issues. Palliative will continue to follow her now and post-operatively if she proceeds to surgery here at [facility].  I have discussed case in detail with CCS PA on floor call today.  Lane Hacker, DO Palliative Medicine [phone]

## 2014-12-19 NOTE — Progress Notes (Addendum)
PROGRESS NOTE    Victoria Alvarez PJK:[id number] DOB: [birth year] DOA: 12/08/2014 PCP: Minerva Ends, MD  Specialists: Dr.Pyrtle. Gastroenterologist.  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, depression, substance abuse, interstitial cystitis, hypercoagulable disorder, CVA, portal vein thrombosis diagnosed 09/2014 at which time Eliquis was started, well known to [facility] who have evaluated her since July 2016 with CT abdomen and colonic inflammation was felt due to ischemic injury, admitted 8/24-8/28 for abdominal pain at which time CTA showed increased mesenteric stranding, lymphadenopathy in the region of long segment distal ileal thickening and focal terminal ileal distention-GI and surgery consulted and treated conservatively with antibiotics, readmitted 9/5-9/26 with similar GI symptoms at which time CT abdomen redemonstrated distal ileal enteritis, failed attempted colonoscopy 9/7 due to poor prep, successful colonoscopy 9/9 showed distal ileal stricture and active ileitis-pathology suggestive of IBD versus drug-induced/NSAID induced injury, steroids initiated without improvement, CTA 9/15 demonstrated 20 cm segment of distal ileal thickening-surgery did not want to perform resection until 2 weeks off of prednisone and recommended transfer to tertiary care facility and patient placed on waiting list at [facility], was not transferred and discharged home after she improved. Readmitted 12/05/14 with worsening of similar symptoms as prior to admission. [facility] and general surgery has consulted and continue to follow and recommend transfer to a tertiary facility (on [facility] waiting list). Etiology of her abdominal pain is not entirely clear but her discomfort seems to be out of proportion with physical findings. Palliative medicine consulted on 10/5 for symptom management. Pain continues to gradually improve. Unable  to arrange transfer to tertiary hospitals thus far.  Assessment/Plan:   Acute on chronic abdominal pain, nausea and vomiting - Complicated history with frequent hospitalizations recently - Etiology not fully clear but DD-Crohn's disease/IBD with distal ileal stricture, vascular/ischemia, doubt infectious, constipation contributing - Symptoms felt to be out of proportion with clinical findings or lab data. No evidence of obstruction. - Has undergone extensive recent evaluation as indicated above. Steroids have not helped in the past. Gabapentin started 10/4 - [facility] general surgery consultation and follow-up appreciated. They recommend transfer to [facility] for further evaluation-on waiting list at [facility] has been calling daily for updates. - no plans for endoscopy Intervention or surgery at this time - Dr. Verlon Au performed speculum exam on 10/4 and added intravaginal Valium-did not help-DC. Urine pregnancy test negative - Palliative care consulted on 10/5 for symptom management-adjusting medications and pain slightly better controlled. - Small bowel series 10/5 shows distal ileal stricture favoring Crohn's versus less likely infectious enteritis. GI had consulted surgery for possible intervention. Discussed with surgical team 10/6: Do not plan surgery here and recommend transfer to tertiary center for surgery and high risk patient. - Repeat ESR and CRP normal. - Called [facility] 10/6 and are unable to accept outside transfer secondary to their own peak census - [facility] 10/7: State that they still have no available beds to accept patient but indicate that the weekends are usually when beds become available and may be able to accept patient. - Abdominal pain seems to be gradually improving. Over the last 48 hours, on my visit, she has seemed quite comfortable and in no obvious distress. An option may be to discharge home when pain reasonably controlled and which she can manage with  oral pain meds and outpatient follow-up at [facility] or [facility]. She feels this plan is reasonable and  states that she will attempt to move around more today and see how she feels. Unclear etiology of Lactobacillus found on urine culture results. Urine microscopy was unimpressive. She has not had fever or leukocytosis. - [facility] 10/10: No beds available yet. Have placed a call to [facility] as well: Waiting to hear back. Dr. Hilma Favors, palliative medicine is attempting to contact [facility].  Anemia of chronic disease/normocytic anemia  - Stable   Hypercoagulable disorder/portal vein & sagittal sinus thrombosis/history of CVA  - On IV heparin initially switched to Eliquis 10/4.  - Hematologist has recommended lifelong anticoagulation.   Chronic pain/methadone maintenance - Apparently has been on narcotics since mid teens for management of interstitial cystitis - Palliative care team input appreciated. Pain seems to be better controlled.  Seizure disorder - Continue Keppra  Interstitial cystitis - Dr. Verlon Au discussed with urology/Dr. Era Bumpers on 10/4 recommended no narcotics for interstitial cystitis but did recommend vaginal Valium suppositories-did not help and hence will DC.  Tobacco abuse - Cessation counseled  Constipation - Complicating chronic narcotic use. May be contributing to her abdominal pain - Having BMs.   DVT prophylaxis: was IV heparin anticoagulation & switched to Eliquis 10/4 Code Status: Full Family Communication: Offered to speak to family to update care- patient declined and stated that she would update family. Disposition Plan:  awaiting transfer to [facility] or other tertiary hospital,  when bed available.   Consultants:  General surgery  [facility]-signed off 10/7.  Palliative care medicine  Procedures:  None  Antibiotics:   None  Subjective: Tolerating soft diet. Having BMs. Continues with  abdominal pain.    Objective: Filed Vitals:   12/18/14 1731 12/18/14 2106 12/19/14 0522 12/19/14 1000  BP: 118/60 104/60 123/82 118/60  Pulse: 82 72 88 92  Temp: 98 F (36.7 C) 98.6 F (37 C) 97.8 F (36.6 C) 98.6 F (37 C)  TempSrc: Oral  Oral Oral  Resp: _0 Height:      Weight:  90.719 kg (200 lb)    SpO2: 98% 100% 100% 100%    Intake/Output Summary (Last 24 hours) at 12/19/14 1444 Last data filed at 12/19/14 1300  Gross per 24 hour  Intake    960 ml  Output      0 ml  Net    960 ml   Filed Weights   12/16/14 2045 12/17/14 1946 12/18/14 2106  Weight: 91.309 kg (201 lb 4.8 oz) 89.359 kg (197 lb) 90.719 kg (200 lb)     Exam:  General exam: Moderately built and nourished pleasant young female sitting up comfortably in bed and does not appear in any kind of distress. Has not looked in any distress over the last 72 hours at least. Respiratory system: Clear. No increased work of breathing. Cardiovascular system: S1 & S2 heard, RRR. No JVD, murmurs, gallops, clicks or pedal edema. Gastrointestinal system: Abdomen is nondistended, soft and nontender. Normal bowel sounds heard. Central nervous system: Alert and oriented. No focal neurological deficits. Extremities: Symmetric 5 x 5 power.   Data Reviewed: Basic Metabolic Panel: No results for input(s): NA, K, CL, CO2, GLUCOSE, BUN, CREATININE, CALCIUM, MG, PHOS in the last 168 hours. Liver Function Tests: No results for input(s): AST, ALT, ALKPHOS, BILITOT, PROT, ALBUMIN in the last 168 hours. No results for input(s): LIPASE, AMYLASE in the last 168 hours. No results for input(s): AMMONIA in the last 168 hours. CBC:  Recent Labs Lab 12/13/14 0715  WBC 4.8  HGB 11.6*  HCT 37.5  MCV 79.1  PLT 188   Cardiac Enzymes: No results for input(s): CKTOTAL, CKMB, CKMBINDEX, TROPONINI in the last 168 hours. BNP (last 3 results) No results for input(s): PROBNP in the last 8760 hours. CBG:  Recent Labs Lab  12/12/14 2359 12/13/14 0541 12/13/14 0743 12/13/14 1255 12/13/14 1803  GLUCAP 92 104* 107* 87 86    Recent Results (from the past 240 hour(s))  Culture, Urine     Status: None   Collection Time: 12/15/14 11:44 PM  Result Value Ref Range Status   Specimen Description URINE, CLEAN CATCH  Final   Special Requests NONE  Final   Culture >=100,000 COLONIES/mL LACTOBACILLUS SPECIES  Final   Report Status 12/17/2014 FINAL  Final           Studies: No results found.      Scheduled Meds: . apixaban  5 mg Oral BID  . clonazePAM  0.5 mg Oral BID  . DULoxetine  30 mg Oral Daily  . levETIRAcetam  500 mg Oral BID  . methadone  85 mg Oral Q breakfast  . mirtazapine  15 mg Oral QHS  . polyethylene glycol  17 g Oral BID  . senna  2 tablet Oral BID   Continuous Infusions:   Active Problems:   Anemia, iron deficiency   Seizure (HCC)   Portal vein thrombosis   Abdominal pain   Generalized abdominal pain   Stricture of small intestine (HCC)   Palliative care encounter    Time spent: 20 minutes     Diane Mochizuki, MD, FACP, FHM. [facility] Pager [phone]  If 7PM-7AM, please contact night-coverage [email or web address] Password [email or web address] 12/19/2014, 2:44 PM    LOS: 10 days

## 2014-12-19 NOTE — Progress Notes (Signed)
Called report to [facility] [place] [place] [facility] to room 954. [facility] to transport.

## 2014-12-19 NOTE — Progress Notes (Addendum)
Will need to consider starting her back on her Elmiron for Interstitial Cystitis-this was stopped during her pregnancy due to teratogenicity and bleeding risk. Since she may have surgery I will wait to start this medication which is a urinary anesthetic-it has antiplatelet/heparinoid properties.Will need to check with pharmacy to see if this would delay surgery and if started how long it would need to be held prior to a procedure.  Spoke with Dr. Dwain Sarna. Will explore options at [facility] and [facility] since [facility] does not have bed availability.  Anderson Malta, DO Palliative Medicine [phone]

## 2014-12-19 NOTE — Progress Notes (Signed)
Pt accepted at [facility] 954 phone # [phone], charge nurse phone [phone]. Paged Dr. Waymon Amato, need discharge summary, medical necessity, EMTALA, order to transport.  Dr. Waymon Amato stated he had called [facility] and would call back with the destination.

## 2014-12-19 NOTE — Progress Notes (Signed)
Call placed to [facility] requesting transfer for definitive surgical intervention. Patient deemed to be to high risk for surgery here at [facility].  Anderson Malta, DO Palliative Medicine [phone]

## 2014-12-19 NOTE — Progress Notes (Signed)
Daily Progress Note   Patient Name: Victoria Alvarez       Date: 12/19/2014 DOB: 1990/10/01  Age: 24 y.o. MRN#: [id number] Attending Physician: Elease Etienne, MD Primary Care Physician: Lora Paula, MD Admit Date: 12/08/2014  Reason for Consultation/Follow-up: Non pain symptom management, Pain control and Psychosocial/spiritual support  Subjective: Still having abd pain but able to verbalize improvement. Rates her pain as a 5/10, states it's "managable". Very hopeless about not progressing with addressing underlying pain causation. She is still having acute episodes that require iv fentanyl where her pain escalates to a 9/10 usually after movement. She states her pain is constant . Feels pain at umbilicus and to the RLQ. She also describes it as crampy, pressure internally No trouble urinating. Bowels moving.Eating small amounts of soft diet. No vomiting. Has mild nausea with acute pain. No pain with urination or BM Interval Events: none Length of Stay: 10 days  Current Medications: Scheduled Meds:  . apixaban  5 mg Oral BID  . clonazePAM  0.5 mg Oral BID  . DULoxetine  30 mg Oral Daily  . levETIRAcetam  500 mg Oral BID  . methadone  85 mg Oral Q breakfast  . mirtazapine  15 mg Oral QHS  . polyethylene glycol  17 g Oral BID  . senna  2 tablet Oral BID    Continuous Infusions:    PRN Meds: acetaminophen **OR** acetaminophen, fentaNYL (SUBLIMAZE) injection, LORazepam, ondansetron **OR** ondansetron (ZOFRAN) IV, oxyCODONE, sodium phosphate  Palliative Performance Scale: 60-70%     Vital Signs: BP 123/82 mmHg  Pulse 88  Temp(Src) 97.8 F (36.6 C) (Oral)  Resp 18  Ht  (1.575 m)  Wt 90.719 kg (200 lb)  BMI 36.57 kg/m2  SpO2 100%  LMP  (Within Months) SpO2: SpO2: 100 % O2 Device: O2 Device: Not Delivered O2 Flow Rate:    Intake/output summary:  Intake/Output Summary (Last 24 hours) at 12/19/14 0911 Last data filed at 12/19/14 0409  Gross per 24 hour    Intake    720 ml  Output      0 ml  Net    720 ml   LBM:   Baseline Weight: Weight: 91.627 kg (202 lb) Most recent weight: Weight: 90.719 kg (200 lb)  Physical Exam: General: Well nourished female, appears in severe pain as she walks back from bathroom Resp: Lungs clear. No work of breathing Cardiac: RRR GI. Abd soft, BS x 4, diffuse tenderness to entire abd area. LBM 10/9              Additional Data Reviewed: No results for input(s): WBC, HGB, PLT, NA, BUN, CREATININE in the last 72 hours.  Invalid input(s): ALB   Problem List:  Patient Active Problem List   Diagnosis Date Noted  . Palliative care encounter   . Stricture of small intestine (HCC)   . Abdominal pain 12/09/2014  . Generalized abdominal pain   . Ileitis   . Lactic acidosis 11/14/2014  . Slow transit constipation   . Depression 11/03/2014  . Cerebral venous sinus thrombosis   . AP (abdominal pain)   . Portal vein thrombosis   . Pneumatosis of intestines 10/08/2014  . Chronic diarrhea 07/12/2014  . 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 Headache(784.0) 06/16/2014  . Headache 06/16/2014  . Bee sting allergy 06/16/2014  . Cerebral vein thrombosis 06/12/2014  . Cerebral venous infarction, acute (HCC)   . Seizure (HCC) 06/11/2014  . Anemia, iron deficiency   .  Tobacco abuse   . Asthma with acute exacerbation   . Thrombosis, superior sagittal sinus   . 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 Right sided weakness   . Marijuana smoker (HCC) 03/01/2014  . Current smoker 03/01/2014     Palliative Care Assessment & Plan    Code Status:  Full code  Goals of Care:  n/a  Symptom Management:  Pain: Some improvement in terms she is now having periods of times where she describes her pain as manageable but still verbalizing some level of pain continuously, with acute pain after movement. Cont oxycodone 15 mg prn as first line which works better for her  than injectable dilaudid, fentanyl iv 50 mcg q2 prn acute pain. Cont methadone however this is for opioid maintanence vs. pain control. I have not seen this as helping even after initial administration in am.Cont Remeron 15 mg QHS  Depression: Cont Cymbalta 30 mg daily. Titrate to 60 mg 10/13  Anxiety: Cont klonopin .5 BID and ativan po for break through. This was not an issue prior to this severe pain developing but pt struggling to cope with new acute pain  Palliative Prophylaxis:  Bowel regimen in place.   Psycho-social/Spiritual:  Desire for further Chaplaincy support:no   Prognosis:n/a Discharge Planning: TBD   Care plan was discussed with Dr. Linna Darner  Thank you for allowing the Palliative Medicine Team to assist in the care of this patient. Victoria Alvarez is struggling with people "not believing her because I am on methadone". I see her pain presentation as new acute pain on chronic. She has been very consistent in her description of the pain. She also is verbalizing some relief with the interventions we have put in place as opposed to never reporting any improvement. She has not been invested in staying on injectable drugs. Has a chronic pain pt since age 63, her pain presentation is in keeping with her current clinical presentation with new onset of  acute pain . 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 I have enjoyed talking to Victoria Alvarez at length and I am hopeful for her that when this underlying condition is resolved her goal  to come off methadone will be realized. She has already discussed this the methadone clinic .   Time In: 1730 Time Out: 1815 Total Time 45 Prolonged Time Billed  no     Greater than 50%  of this time was spent counseling and coordinating care related to the above assessment and plan.   Irean Hong, NP  12/19/2014, 9:11 AM   Please contact Palliative Medicine Team phone at [phone] for questions and concerns.

## 2014-12-19 NOTE — Discharge Summary (Addendum)
Physician Discharge Summary  Victoria Alvarez BPZ:[id number] DOB: 1990-04-24 DOA: 12/08/2014  PCP: Minerva Ends, MD  Gastroenterologist: Dr. Zenovia Jarred   Admit date: 12/08/2014 Discharge date: 12/19/2014  Time spent: Greater than 30 minutes  Recommendations for Outpatient Follow-up:  1. Patient is being transferred to [facility] Internal Medicine/Hospitalist department for further evaluation and management.  She will need surgical evaluation. I discussed with general surgeon on call  Dr. Johney Maine at [facility] who recommended transferring to internal medicine service first and Surgery will then consult. Accepting M.D.:Dr. Lizabeth Leyden.  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[place])   Palliative care encounter   Discharge Condition: Improved & Stable  Diet recommendation: Soft Diet  Filed Weights   12/16/14 2045 12/17/14 1946 12/18/14 2106  Weight: 91.309 kg (201 lb 4.8 oz) 89.359 kg (197 lb) 90.719 kg (200 lb)    History of present illness:  24 year old female with history of chronic pain, on chronic methadone due to narcotic dependency, depression, substance abuse, interstitial cystitis, hypercoagulable disorder, CVA, portal vein thrombosis diagnosed 09/2014 at which time Eliquis was started, 5 months postpartum, well known to Victoria who have evaluated her since July 2016 with CT abdomen and colonic inflammation was felt due to ischemic injury, admitted 8/24-8/28 for abdominal pain at which time CTA showed increased mesenteric stranding, lymphadenopathy in the region of long segment distal ileal thickening and focal terminal ileal distention-GI and surgery consulted and treated conservatively with antibiotics, readmitted 9/5-9/26 with similar GI symptoms at which time CT abdomen redemonstrated distal ileal enteritis, failed  attempted colonoscopy 9/7 due to poor prep, successful colonoscopy 9/9 showed distal ileal stricture and active ileitis-pathology suggestive of IBD versus drug-induced/NSAID induced injury, steroids initiated without improvement, CTA 9/15 demonstrated 20 cm segment of distal ileal thickening-surgery did not want to perform resection until 2 weeks off of prednisone and recommended transfer to tertiary care facility but patient could not be transferred in a timely manner and was discharged home after she improved. Readmitted 12/05/14 with worsening of similar symptoms as prior to admission. [name] and general surgery have consulted and recommend transfer to a tertiary facility- after several days waiting,  [name] has finally accepted patient in transfer on 12/19/14.   Hospital Course:   Acute on chronic abdominal pain, nausea and vomiting - Complicated history with frequent hospitalizations recently - Etiology not fully clear but DD-Crohn's disease/IBD with distal ileal stricture, vascular/ischemia, doubt infectious, constipation contributing - No evidence of obstruction. - Has undergone extensive recent evaluation as indicated above. Steroids have not helped in the past. Gabapentin started 10/4. Patient has been off steroids for at least greater than 2 weeks.  [name] and general surgery consultation and follow-up appreciated. They recommend transfer to a tertiary facility for further evaluation. - no plans for endoscopy Intervention or surgery at this time - Vaginal speculum exam on 10/4 was performed and added intravaginal Valium-did not help-DC'ed. Urine pregnancy test negative - Palliative care consulted on 10/5 for symptom management-adjusting medications and pain slightly better controlled. - Small bowel series 10/5 shows distal ileal stricture favoring Crohn's versus less likely infectious enteritis. GI had consulted surgery for possible intervention. Discussed  with surgical team 10/6: Do not plan surgery here and recommend transfer to tertiary center for surgery and high risk patient. - Repeat ESR and  CRP normal. - Unclear significance  of Lactobacillus found on urine culture results. Urine microscopy was unimpressive. She has not had fever or leukocytosis. - as per palliative care M.D. Dr. [name]'s follow-up today: There team has been working with patient on her pain management and have made improvements in this without addressing the underlying issues causing the pain. She has no signs of "drug-seeking" behavior, misuse or abuse. She has no unusual activity on her [name] controlled substance database report and they have verified she is adherent and compliant in their methadone treatment program. She is on methadone for management of opioid dependence after a long history of prescribed opioids for chronic interstitial cystitis pain which complicates her pain management in general.  - She is being transferred to [facility] for evaluation and management of what is likely Crohn's ileitis, ulceration and stricture disease that has largely failed medical management. Without definitive management, she is at high risk for readmission and potential serious complications.   Anemia of chronic disease/normocytic anemia  - Stable   Hypercoagulable disorder/portal vein & sagittal sinus thrombosis/history of CVA  - On IV heparin initially switched to Eliquis 10/4.  - Hematologist has recommended lifelong anticoagulation.   Chronic pain/methadone maintenance - Apparently has been on narcotics since mid teens for management of chronic interstitial cystitis - Palliative care team input appreciated. Pain seems to be better controlled.  Seizure disorder - Continue Keppra  Chronic Interstitial cystitis - need to consider starting her back on Elmiron or chronic interstitial cystitis-this was stopped during her pregnancy due to teratogenicity  and bleeding risk. Since surgery may be contemplated, will continue to hold this medication since it has antiplatelet/heparinoid properties.  Tobacco abuse - Cessation counseled  Constipation - Complicating chronic narcotic use. May be contributing to her abdominal pain - Having BMs.   Consultants:  General surgery  [facility]-signed off 10/7.  Palliative care medicine  Procedures:  None  Antibiotics:  None   Discharge Exam:  Complaints: Tolerating soft diet and having BMs. No nausea or vomiting reported. Continues to complain of abdominal pain but better compared to last week.   Filed Vitals:   12/18/14 2106 12/19/14 0522 12/19/14 1000 12/19/14 1614  BP: 104/60 123/82 118/60 107/66  Pulse: 72 88 92 88  Temp: 98.6 F (37 C) 97.8 F (36.6 C) 98.6 F (37 C) 98 F (36.7 C)  TempSrc:  Oral Oral Oral  Resp: 17 18 18 17   Height:      Weight: 90.719 kg (200 lb)     SpO2: 100% 100% 100% 100%    General exam: Moderately built and nourished pleasant young female sitting up comfortably in bed and does not appear in any kind of distress. Has not looked in any distress over the last 72 hours at least. Respiratory system: Clear. No increased work of breathing. Cardiovascular system: S1 & S2 heard, RRR. No JVD, murmurs, gallops, clicks or pedal edema. Gastrointestinal system: Abdomen is nondistended, soft and nontender. Normal bowel sounds heard. Central nervous system: Alert and oriented. No focal neurological deficits. Extremities: Symmetric 5 x 5 power.  Discharge Instructions      Discharge Instructions    (HEART FAILURE PATIENTS) Call MD:  Anytime you have any of the following symptoms: 1) 3 pound weight gain in 24 hours or 5 pounds in 1 week 2) shortness of breath, with or without a dry hacking cough 3) swelling in the hands, feet or stomach 4) if you have to sleep on  extra pillows at night in order to breathe.    Complete by:  As directed      Call MD for:   difficulty breathing, headache or visual disturbances    Complete by:  As directed      Call MD for:  extreme fatigue    Complete by:  As directed      Call MD for:  hives    Complete by:  As directed      Call MD for:  persistant dizziness or light-headedness    Complete by:  As directed      Call MD for:  persistant nausea and vomiting    Complete by:  As directed      Call MD for:  severe uncontrolled pain    Complete by:  As directed      Call MD for:  temperature >100.4    Complete by:  As directed      Discharge instructions    Complete by:  As directed   Diet: Soft.     Increase activity slowly    Complete by:  As directed             Medication List    STOP taking these medications        docusate sodium 100 MG capsule  Commonly known as:  COLACE     FLUoxetine 20 MG capsule  Commonly known as:  PROZAC     Linaclotide 145 MCG Caps capsule  Commonly known as:  LINZESS     nicotine 21 mg/24hr patch  Commonly known as:  NICODERM CQ - dosed in mg/24 hours     oxyCODONE-acetaminophen 5-325 MG tablet  Commonly known as:  PERCOCET/ROXICET      TAKE these medications        apixaban 5 MG Tabs tablet  Commonly known as:  ELIQUIS  Take 1 tablet (5 mg total) by mouth 2 (two) times daily.     clonazePAM 0.5 MG tablet  Commonly known as:  KLONOPIN  Take 1 tablet (0.5 mg total) by mouth 2 (two) times daily.     DULoxetine 30 MG capsule  Commonly known as:  CYMBALTA  Take 1 capsule (30 mg total) by mouth daily.     EPINEPHrine 0.3 mg/0.3 mL Soaj injection  Commonly known as:  EPI-PEN  Inject 0.3 mLs (0.3 mg total) into the muscle once.     fentaNYL 100 MCG/2ML injection  Commonly known as:  SUBLIMAZE  Inject 1 mL (50 mcg total) into the vein every 2 (two) hours as needed for severe pain.     levETIRAcetam 500 MG tablet  Commonly known as:  KEPPRA  Take 1 tablet (500 mg total) by mouth 2 (two) times daily.     LORazepam 0.5 MG tablet  Commonly known as:   ATIVAN  Take 1 tablet (0.5 mg total) by mouth every 6 (six) hours as needed for anxiety or sleep.     methadone 5 MG tablet  Commonly known as:  DOLOPHINE  Take 17 tablets (85 mg total) by mouth daily. Home medication continued     mirtazapine 15 MG tablet  Commonly known as:  REMERON  Take 1 tablet (15 mg total) by mouth at bedtime.     ondansetron 4 MG disintegrating tablet  Commonly known as:  ZOFRAN ODT  Take 1 tablet (4 mg total) by mouth every 8 (eight) hours as needed for nausea or vomiting.     oxyCODONE 15 MG immediate  release tablet  Commonly known as:  ROXICODONE  Take 1 tablet (15 mg total) by mouth every 3 (three) hours as needed for moderate pain.     polyethylene glycol packet  Commonly known as:  MIRALAX / GLYCOLAX  Take 17 g by mouth 2 (two) times daily.     senna 8.6 MG Tabs tablet  Commonly known as:  SENOKOT  Take 2 tablets (17.2 mg total) by mouth 2 (two) times daily.     sodium phosphate 7-19 GM/118ML Enem  Place 133 mLs (1 enema total) rectally daily as needed for moderate constipation.          The results of significant diagnostics from this hospitalization (including imaging, microbiology, ancillary and laboratory) are listed below for reference.    Significant Diagnostic Studies: Ct Abdomen Pelvis W Contrast  12/10/2014   CLINICAL DATA:  23 year old female with acute abdominal pelvic pain for 2 days.  EXAM: CT ABDOMEN AND PELVIS WITH CONTRAST  TECHNIQUE: Multidetector CT imaging of the abdomen and pelvis was performed using the standard protocol following bolus administration of intravenous contrast.  CONTRAST:  141m OMNIPAQUE IOHEXOL 300 MG/ML  SOLN  COMPARISON:  11/24/2014 and prior studies  FINDINGS: Lower chest:  Unremarkable  Hepatobiliary: The liver and gallbladder are unremarkable. There is no evidence of biliary dilatation.  Pancreas: Unremarkable  Spleen: Unremarkable  Adrenals/Urinary Tract: The kidneys, adrenal glands and bladder are  unremarkable.  Stomach/Bowel: There is circumferential wall thickening of a segment of small bowel/ ileum in the right abdomen with adjacent inflammation (15 cm in length) compatible with enteritis and may represent Crohn's disease. The terminal ileum however appears normal. There is no evidence of bowel obstruction, pneumoperitoneum or abscess. Mildly enlarged mesenteric lymph nodes in the right abdomen are identified.  Vascular/Lymphatic: No enlarged lymph nodes or abdominal aortic aneurysm.  Reproductive: The uterus and adnexal regions are unremarkable.  Other: No free fluid  Musculoskeletal: No acute or suspicious abnormality.  IMPRESSION: Circumferential wall thickening of a loop of small bowel/ ileum in the right abdomen with adjacent inflammation (15 cm in length). This most likely represents an enteritis which would include Crohn's disease, but the terminal ileum appears normal.   Electronically Signed   By: JMargarette CanadaM.D.   On: 12/10/2014 17:43   Dg Small Bowel  12/14/2014   CLINICAL DATA:  ct demonstrating distal enteritis. biopsy via colonoscopy pending. History of hypercoagulable status with cerebral venous thrombosis.  EXAM: SMALL BOWEL SERIES  COMPARISON:  CT of 12/10/2014 and 11/14/2014.  TECHNIQUE: Following ingestion of thin barium, serial small bowel images were obtained including spot views of the terminal ileum.  FLUOROSCOPY TIME:  If the device does not provide the exposure index:  Fluoroscopy Time (in minutes and seconds):  0 minutes 7 seconds  Number of Acquired Images:  9  FINDINGS: Preprocedure scout film demonstrates normal caliber of the colon. No gross small bowel distention identified. Surgical clips in the left hemipelvis.  Small bowel follow-through images demonstrate normal caliber the stomach and duodenal C-loop. The jejunum is normal in caliber and fold pattern.  Mid small bowel loops are mildly dilated, including at 4.1 cm on the 4 hour film.  Corresponding to the CT  abnormality, within the distal ileum, is an approximately 12 cm area of marked narrowing. Minimal nodularity identified within the mid and distal portion of the stricture. The terminal ileum is normal in caliber, but not well opacified.  IMPRESSION: 1. Distal ileal stricture, moderate length, moderate in severity.  Favor Crohn disease versus less likely infectious enteritis. Not a typical appearance or distribution of small bowel ischemia. 2. Mild mid small bowel dilatation, suggesting a component of low-grade obstruction.   Electronically Signed   By: Abigail Miyamoto M.D.   On: 12/14/2014 15:19   Dg Abd 2 Views  12/03/2014   CLINICAL DATA:  Abdominal distention, lower abdominal pain for 2 weeks.  EXAM: ABDOMEN - 2 VIEW  COMPARISON:  CT 11/24/2014  FINDINGS: Large stool burden throughout the colon. There is normal bowel gas pattern. No free air. No organomegaly or suspicious calcification. No acute bony abnormality.  IMPRESSION: Large stool burden.  No acute findings.   Electronically Signed   By: Rolm Baptise M.D.   On: 12/03/2014 13:02   Dg Abd Portable 1v  11/22/2014   CLINICAL DATA:  Right lower quadrant pain with associated mild generalized abdominal pain.  EXAM: PORTABLE ABDOMEN - 1 VIEW  COMPARISON:  11/17/2014  FINDINGS: The bowel gas pattern is nonobstructive. There is a long tubular radiolucent structure which seen to arise from the cecum, with associated surrounding soft tissue thickening.  No radio-opaque calculi or other significant radiographic abnormality are seen. Moderate amount of stool throughout the colon. No radiographic evidence of organomegaly. Osseous structures are grossly normal.  IMPRESSION: Nonobstructive bowel gas pattern with evidence of constipation.  Long tubular structure which seems to arise from wall are overlie the distal cecum. This may represent a gas containing thickened distal ileum, or abnormal appendix. In correlation to CT of the abdomen dated 11/14/2014, there is no  evidence of appendicitis, and the distal ileum appears inflamed. Therefore, this radiographic finding likely correlates to the segment of abnormal ileum. Radiographic or CT follow-up is recommended.  These results will be called to the ordering clinician or representative by the Radiologist Assistant, and communication documented in the PACS or zVision Dashboard.   Electronically Signed   By: Fidela Salisbury M.D.   On: 11/22/2014 12:15   Ct Angio Abd/pel W/ And/or W/o  11/24/2014   CLINICAL DATA:  History of Crohn's disease. Evaluate for portal vein thrombosis.  EXAM: CT ANGIOGRAPHY ABDOMEN AND PELVIS WITH CONTRAST AND WITHOUT CONTRAST  TECHNIQUE: Multidetector CT imaging of the abdomen and pelvis was performed using the standard protocol during bolus administration of intravenous contrast. Multiplanar reconstructed images including MIPs were obtained and reviewed to evaluate the vascular anatomy.  CONTRAST:  179m OMNIPAQUE IOHEXOL 350 MG/ML SOLN  COMPARISON:  CT abdomen pelvis - 11/14/2014  FINDINGS: Vascular Findings:  Abdominal aorta: There is no atherosclerotic plaque within a normal caliber abdominal aorta. No abdominal aortic dissection No aortic wall thickening or periaortic stranding.  Celiac artery: Widely patent.  Conventional branching pattern.  SMA: Widely patent. 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 The distal tributaries of the SMA are widely patent without discrete intraluminal filling defect to suggest distal embolism.  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 The dominant cranial right-sided renal artery is widely patent without a hemodynamically significant stenosis. No vessel irregularity to suggest FMD.  Left Renal artery: Duplicated; there is an accessory left renal artery which  supplies the inferior pole of the left kidney. The dominant left-sided renal artery is widely patent without a hemodynamically significant stenosis. No vessel irregularity to suggest FMD.  IMA: Widely patent without  hemodynamically significant stenosis.  Pelvic vasculature: The bilateral common, external and internal iliac arteries are of normal caliber and widely patent without hemodynamically significant stenosis.  Venous Findings: The portal vein is widely patent. The IVC and bilateral pelvic venous systems are widely patent.  Review of the MIP images confirms the above findings.   --------------------------------------------------------------------------------  Nonvascular Findings:  Normal hepatic contour. There is a minimal amount of focal fatty infiltration adjacent to the fissure for ligamentum teres. No discrete hepatic lesions. Normal appearance of the gallbladder. No intra extrahepatic biliary duct dilatation. No ascites.  There is symmetric enhancement and excretion of the bilateral kidneys. No definite renal stones in this postcontrast examination. No discrete renal lesions. No urinary obstruction or perinephric stranding. Normal appearance of the bilateral adrenal glands, pancreas and spleen.  Note is again made of short segment bowel wall thickening involving an approximately 20 cm segment of the distal ileum though sparing the terminal ileum (representative images 57 through 62, series 11). This finding is associated with mild upstream distension of the adjacent distal ileum (representative image 42, series 11) but not resulting in enteric obstruction. The amount of previously noted adjacent mesenteric stranding and reactive adenopathy within the adjacent mesenteries has decreased in the interval. No evidence of perforation or definable/drainable fluid collection. No pneumoperitoneum, pneumatosis or portal venous gas.  Large colonic stool burden. Normal appearance of the retrocecal appendix.  No  bulky retroperitoneal, mesenteric, pelvic or inguinal lymphadenopathy.  Normal appearance of the pelvic organs. No discrete adnexal lesion. Normal appearance of the urinary bladder given underdistention. No free fluid in the pelvic cul-de-sac.  Limited visualization of lower thorax demonstrates minimal subsegmental atelectasis within the image caudal aspect the left lower lobe. No focal airspace opacities. No pleural effusion. Normal heart size. No pericardial effusion.  No acute or aggressive osseous abnormalities. Note is made of mild bilateral symmetric sacroiliitis potentially attributable to provided history of Crohn's disease.  Tiny mesenteric fat containing periumbilical hernia. There is a minimal amount of soft tissue stranding about the midline of the low back.  IMPRESSION: Vascular Impression:  1. Unremarkable CTA of the abdomen and pelvis. No evidence of mesenteric ischemia or portal vein thrombosis. 2. 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 Nonvascular Impression:  1. Minimally improved bowel wall thickening involving an approximately 20 cm segment of the distal ileum with sparing of the TI and cecum, findings compatible with provided history of Crohn's disease. There is mild upstream distention of the adjacent small bowel without evidence of enteric obstruction. No evidence of perforation or definable/drainable fluid collection. 2. Suspected mild bilateral symmetric sacroiliitis, potentially attributable to provided history of Crohn's disease.   Electronically Signed   By: Sandi Mariscal M.D.   On: 11/24/2014 16:37    Microbiology: Recent Results (from the past 240 hour(s))  Culture, Urine     Status: None   Collection Time: 12/15/14 11:44 PM  Result Value Ref Range Status   Specimen Description URINE, CLEAN CATCH  Final   Special Requests NONE  Final   Culture  >=100,000 COLONIES/mL LACTOBACILLUS SPECIES  Final   Report Status 12/17/2014 FINAL  Final     Labs: Basic Metabolic Panel: No results for input(s): NA, K, CL, CO2, GLUCOSE, BUN, CREATININE, CALCIUM, MG, PHOS in the last 168 hours. Liver Function Tests: No results for input(s): AST, ALT, ALKPHOS, BILITOT, PROT, ALBUMIN in the last 168 hours. No results for input(s): LIPASE,  AMYLASE in the last 168 hours. No results for input(s): AMMONIA in the last 168 hours. CBC:  Recent Labs Lab 12/13/14 0715  WBC 4.8  HGB 11.6*  HCT 37.5  MCV 79.1  PLT 188   Cardiac Enzymes: No results for input(s): CKTOTAL, CKMB, CKMBINDEX, TROPONINI in the last 168 hours. BNP: BNP (last 3 results) No results for input(s): BNP in the last 8760 hours.  ProBNP (last 3 results) No results for input(s): PROBNP in the last 8760 hours.  CBG:  Recent Labs Lab 12/12/14 2359 12/13/14 0541 12/13/14 0743 12/13/14 1255 12/13/14 1803  GLUCAP 92 104* 107* 87 86        Signed:  Lanee Chain, MD, FACP, FHM. [facility] Pager [phone]  If 7PM-7AM, please contact night-coverage [email or web address] Password [email or web address] 12/19/2014, 4:43 PM

## 2014-12-19 NOTE — Progress Notes (Signed)
Pt left floor via [facility] to [place].

## 2015-02-13 ENCOUNTER — Emergency Department (HOSPITAL_COMMUNITY): Payer: Medicaid Other

## 2015-02-13 ENCOUNTER — Encounter (HOSPITAL_COMMUNITY): Payer: Self-pay | Admitting: *Deleted

## 2015-02-13 ENCOUNTER — Emergency Department (HOSPITAL_COMMUNITY)
Admission: EM | Admit: 2015-02-13 | Discharge: 2015-02-13 | Disposition: A | Discharge status: Home / Self Care | Payer: Medicaid Other | Attending: Emergency Medicine | Admitting: Emergency Medicine

## 2015-02-13 DIAGNOSIS — Z8659 Personal history of other mental and behavioral disorders: Secondary | ICD-10-CM | POA: Insufficient documentation

## 2015-02-13 DIAGNOSIS — Z7902 Long term (current) use of antithrombotics/antiplatelets: Secondary | ICD-10-CM | POA: Insufficient documentation

## 2015-02-13 DIAGNOSIS — Z9889 Other specified postprocedural states: Secondary | ICD-10-CM | POA: Diagnosis not present

## 2015-02-13 DIAGNOSIS — G8929 Other chronic pain: Secondary | ICD-10-CM | POA: Insufficient documentation

## 2015-02-13 DIAGNOSIS — Z8742 Personal history of other diseases of the female genital tract: Secondary | ICD-10-CM | POA: Diagnosis not present

## 2015-02-13 DIAGNOSIS — F17210 Nicotine dependence, cigarettes, uncomplicated: Secondary | ICD-10-CM | POA: Insufficient documentation

## 2015-02-13 DIAGNOSIS — Z862 Personal history of diseases of the blood and blood-forming organs and certain disorders involving the immune mechanism: Secondary | ICD-10-CM | POA: Diagnosis not present

## 2015-02-13 DIAGNOSIS — Z9114 Patient's other noncompliance with medication regimen: Secondary | ICD-10-CM | POA: Insufficient documentation

## 2015-02-13 DIAGNOSIS — Z3202 Encounter for pregnancy test, result negative: Secondary | ICD-10-CM | POA: Insufficient documentation

## 2015-02-13 DIAGNOSIS — Z79899 Other long term (current) drug therapy: Secondary | ICD-10-CM | POA: Insufficient documentation

## 2015-02-13 DIAGNOSIS — R110 Nausea: Secondary | ICD-10-CM | POA: Diagnosis not present

## 2015-02-13 DIAGNOSIS — R109 Unspecified abdominal pain: Secondary | ICD-10-CM | POA: Insufficient documentation

## 2015-02-13 DIAGNOSIS — R509 Fever, unspecified: Secondary | ICD-10-CM | POA: Insufficient documentation

## 2015-02-13 DIAGNOSIS — K921 Melena: Secondary | ICD-10-CM | POA: Insufficient documentation

## 2015-02-13 DIAGNOSIS — Z87440 Personal history of urinary (tract) infections: Secondary | ICD-10-CM | POA: Insufficient documentation

## 2015-02-13 DIAGNOSIS — Z9049 Acquired absence of other specified parts of digestive tract: Secondary | ICD-10-CM | POA: Diagnosis not present

## 2015-02-13 DIAGNOSIS — Z8673 Personal history of transient ischemic attack (TIA), and cerebral infarction without residual deficits: Secondary | ICD-10-CM | POA: Insufficient documentation

## 2015-02-13 DIAGNOSIS — Z8701 Personal history of pneumonia (recurrent): Secondary | ICD-10-CM | POA: Diagnosis not present

## 2015-02-13 DIAGNOSIS — Z86718 Personal history of other venous thrombosis and embolism: Secondary | ICD-10-CM | POA: Insufficient documentation

## 2015-02-13 LAB — COMPREHENSIVE METABOLIC PANEL
ALT: 12 U/L — ABNORMAL LOW (ref 14–54)
AST: 17 U/L (ref 15–41)
Albumin: 3.6 g/dL (ref 3.5–5.0)
Alkaline Phosphatase: 67 U/L (ref 38–126)
Anion gap: 7 (ref 5–15)
BUN: 13 mg/dL (ref 6–20)
CO2: 20 mmol/L — ABNORMAL LOW (ref 22–32)
Calcium: 9.1 mg/dL (ref 8.9–10.3)
Chloride: 107 mmol/L (ref 101–111)
Creatinine, Ser: 0.65 mg/dL (ref 0.44–1.00)
GFR calc Af Amer: >60 mL/min (ref >60)
GFR calc non Af Amer: >60 mL/min (ref >60)
Glucose, Bld: 100 mg/dL — ABNORMAL HIGH (ref 65–99)
Potassium: 4.4 mmol/L (ref 3.5–5.1)
Sodium: 134 mmol/L — ABNORMAL LOW (ref 135–145)
Total Bilirubin: 0.6 mg/dL (ref 0.3–1.2)
Total Protein: 5.9 g/dL — ABNORMAL LOW (ref 6.5–8.1)

## 2015-02-13 LAB — CBC
HCT: 30.3 % — ABNORMAL LOW (ref 36.0–46.0)
Hemoglobin: 9.3 g/dL — ABNORMAL LOW (ref 12.0–15.0)
MCH: 23.7 pg — ABNORMAL LOW (ref 26.0–34.0)
MCHC: 30.7 g/dL (ref 30.0–36.0)
MCV: 77.3 fL — ABNORMAL LOW (ref 78.0–100.0)
Platelets: 266 10*3/uL (ref 150–400)
RBC: 3.92 MIL/uL (ref 3.87–5.11)
RDW: 16.1 % — ABNORMAL HIGH (ref 11.5–15.5)
WBC: 4.9 10*3/uL (ref 4.0–10.5)

## 2015-02-13 LAB — I-STAT CG4 LACTIC ACID, ED
Lactic Acid, Venous: 0.92 mmol/L (ref 0.5–2.0)
Lactic Acid, Venous: 0.36 mmol/L — ABNORMAL LOW (ref 0.5–2.0)

## 2015-02-13 LAB — POC OCCULT BLOOD, ED: Fecal Occult Bld: NEGATIVE

## 2015-02-13 LAB — I-STAT BETA HCG BLOOD, ED (MC, WL, AP ONLY): I-stat hCG, quantitative: <5 m[IU]/mL (ref <5)

## 2015-02-13 LAB — LIPASE, BLOOD: Lipase: 53 U/L — ABNORMAL HIGH (ref 11–51)

## 2015-02-13 MED ORDER — ONDANSETRON HCL 4 MG/2ML IJ SOLN
4.0000 mg | INTRAMUSCULAR | Status: AC
  Administered 2015-02-13: 4 mg via INTRAVENOUS
  Filled 2015-02-13: qty 2

## 2015-02-13 MED ORDER — SODIUM CHLORIDE 0.9 % IV BOLUS (SEPSIS)
1000.0000 mL | Freq: Once | INTRAVENOUS | Status: AC
  Administered 2015-02-13: 1000 mL via INTRAVENOUS

## 2015-02-13 MED ORDER — PREDNISONE 20 MG PO TABS: 40.0000 mg | ORAL_TABLET | Freq: Every day | ORAL | Status: DC

## 2015-02-13 MED ORDER — IOHEXOL 300 MG/ML  SOLN: 80.0000 mL | Freq: Once | INTRAMUSCULAR | Status: DC | PRN

## 2015-02-13 MED ORDER — IOHEXOL 300 MG/ML  SOLN
25.0000 mL | Freq: Once | INTRAMUSCULAR | Status: AC | PRN
  Administered 2015-02-13: 25 mL via ORAL

## 2015-02-13 MED ORDER — HYDROMORPHONE HCL 1 MG/ML IJ SOLN
1.0000 mg | Freq: Once | INTRAMUSCULAR | Status: AC
  Administered 2015-02-13: 1 mg via INTRAVENOUS
  Filled 2015-02-13: qty 1

## 2015-02-13 NOTE — ED Notes (Signed)
PA back at bedside.

## 2015-02-13 NOTE — ED Notes (Signed)
Returned from CT.

## 2015-02-13 NOTE — Discharge Instructions (Signed)
1. Medications: prednisone, usual home medications 2. Treatment: rest, drink plenty of fluids 3. Follow Up: please followup with GI at [facility] (they will call you to make appointment) days for discussion of your diagnoses and further evaluation after today's visit; please return to the ER for high fever, severe pain, persistent vomiting, new or worsening symptoms   Abdominal Pain, Adult Many things can cause abdominal pain. Usually, abdominal pain is not caused by a disease and will improve without treatment. It can often be observed and treated at home. Your health care provider will do a physical exam and possibly order blood tests and X-rays to help determine the seriousness of your pain. However, in many cases, more time must pass before a clear cause of the pain can be found. Before that point, your health care provider may not know if you need more testing or further treatment. HOME CARE INSTRUCTIONS Monitor your abdominal pain for any changes. The following actions may help to alleviate any discomfort you are experiencing:  Only take over-the-counter or prescription medicines as directed by your health care provider.  Do not take laxatives unless directed to do so by your health care provider.  Try a clear liquid diet (broth, tea, or water) as directed by your health care provider. Slowly move to a bland diet as tolerated. SEEK MEDICAL CARE IF:  You have unexplained abdominal pain.  You have abdominal pain associated with nausea or diarrhea.  You have pain when you urinate or have a bowel movement.  You experience abdominal pain that wakes you in the night.  You have abdominal pain that is worsened or improved by eating food.  You have abdominal pain that is worsened with eating fatty foods.  You have a fever. SEEK IMMEDIATE MEDICAL CARE IF:  Your pain does not go away within 2 hours.  You keep throwing up (vomiting).  Your pain is felt only in portions of the abdomen, such  as the right side or the left lower portion of the abdomen.  You pass bloody or black tarry stools. MAKE SURE YOU:  Understand these instructions.  Will watch your condition.  Will get help right away if you are not doing well or get worse.   This information is not intended to replace advice given to you by your health care provider. Make sure you discuss any questions you have with your health care provider.   Document Released: 12/05/2004 Document Revised: 11/16/2014 Document Reviewed: 11/04/2012 Elsevier Interactive Patient Education [employer].

## 2015-02-13 NOTE — ED Notes (Signed)
Patient transported to CT 

## 2015-02-13 NOTE — ED Notes (Signed)
Pt reports hx of colon surgery 10/17. Now having left side abd pain, nausea and loose stools. Reports blood in stools.

## 2015-02-13 NOTE — ED Provider Notes (Signed)
CSN: [id number]     Arrival date & time 02/13/15  1062 History   First MD Initiated Contact with Patient 02/13/15 1032     Chief Complaint  Patient presents with  . Abdominal Pain    HPI   Victoria Alvarez is a 24 y.o. female with a PMH of CVA on eliquis, venous sinus thrombosis, chronic pelvic pain, intestinal pneumatosis, distal ileal stricture s/p ileocolectomy who presents to the ED with abdominal pain, nausea, and diarrhea, which she states started Friday and has been persistent since that time. She reports associated hematochezia yesterday and today as well as poor PO intake. She denies exacerbating or alleviating factors. She denies dysuria, urgency, frequency, vaginal discharge.   Patient evaluated in the ED 9/29 for abdominal pain and ultimately admitted. She was found to have distal ileal stricture and was transferred to [facility] for further evaluation on 10/10. She underwent ileocolectomy 10/17 (pathology positive for crohn's disease). Her hospital course was complicated by UTI as well as infection in her midline incision, which was opened and packed (patient still packing). She also was found to have intraabdominal abscess. IR placed 2 drains 10/17, which were subsequently removed 11/22 after resolution of fluid collection. She states on discharge she felt back to her normal self until she started to experience symptoms on Friday.  Past Medical History  Diagnosis Date  . Interstitial cystitis     pain from this was reason for starting opioids age 48.   . Chronic pelvic pain in female   . Headache(784.0)   . Pyelonephritis affecting pregnancy in first trimester July 2015  . Depression 2007  . Opioid dependence ([place])     to Rx opioids.  switched to Methadone after second child born in 04/2014  . Pneumonia 03/2014  . Seizures ([place])   . Stroke [facility]) 05/2014    presented with right sided weakness.   . Numbness     right side  . 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 Preeclampsia 2012  .  Miscarriage 2005  . Non-compliance     with anticoagulant  . 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 Cerebral venous sinus thrombosis   . Morbid obesity ([place])     BMI 37 in 11/2014.  [name] Anemia 07/2014    Microcytic  . Hypercoagulable state, primary ([place]) 07/2014    . no clear inherited or acquired cause for the patient's coagulopathy   Past Surgical History  Procedure Laterality Date  . Vagina surgery  ~ 2011    benign vaginal tumor removed at [facility].   . Cesarean section N/A 05/06/2014    Procedure: CESAREAN SECTION;  Surgeon: Truett Mainland, DO;  Location: [facility];  Service: Obstetrics;  Laterality: N/A;  . Inguinal hernia repair Left 2008  . Colonoscopy with propofol N/A 11/18/2014    Procedure: COLONOSCOPY WITH PROPOFOL;  Surgeon: Milus Banister, MD;  Location: [place];  Service: Endoscopy;  Laterality: N/A;   Family History  Problem Relation Age of Onset  . Diabetes Mother   . Heart disease Mother   . Hypertension Father   . Diabetes Maternal Grandmother   . Hypertension Maternal Grandmother   . Stroke Maternal Grandmother   . Diabetes Maternal Grandfather   . Hypertension Maternal Grandfather   . Diabetes Paternal Grandmother   . Diabetes Paternal Grandfather    Social History  Substance Use Topics  . Smoking status: Current Every Day Smoker -- 0.50 packs/day for 8 years    Types: Cigarettes  . Smokeless  tobacco: Never Used     Comment: cutting back  . Alcohol Use: No   OB History    Gravida Para Term Preterm AB TAB SAB Ectopic Multiple Living   3 2 2  0 1 0 1 0 0 2      Review of Systems  Constitutional: Positive for fever and chills.  Respiratory: Negative for shortness of breath.   Cardiovascular: Negative for chest pain.  Gastrointestinal: Positive for nausea, abdominal pain, diarrhea and blood in stool. Negative for vomiting and constipation.  Genitourinary: Negative for dysuria, urgency, frequency, vaginal bleeding and vaginal discharge.  All other  systems reviewed and are negative.     Allergies  Bee venom  Home Medications   Prior to Admission medications   Medication Sig Start Date End Date Taking? Authorizing Provider  apixaban (ELIQUIS) 5 MG TABS tablet Take 5 mg by mouth 2 (two) times daily.   Yes Historical Provider, MD  levETIRAcetam (KEPPRA) 500 MG tablet Take 1 tablet (500 mg total) by mouth 2 (two) times daily. 06/13/14  Yes Reyne Dumas, MD  methadone (DOLOPHINE) 5 MG tablet Take 17 tablets (85 mg total) by mouth daily. Home medication continued Patient taking differently: Take 84 mg by mouth daily. Home medication continued. Patient states she takes 17 tablets every day per patient 10/16/14  Yes Thurnell Lose, MD  apixaban (ELIQUIS) 5 MG TABS tablet Take 1 tablet (5 mg total) by mouth 2 (two) times daily. 10/16/14 02/03/15  Thurnell Lose, MD  clonazePAM (KLONOPIN) 0.5 MG tablet Take 1 tablet (0.5 mg total) by mouth 2 (two) times daily. Patient not taking: Reported on 02/13/2015 12/19/14   Modena Jansky, MD  DULoxetine (CYMBALTA) 30 MG capsule Take 1 capsule (30 mg total) by mouth daily. Patient not taking: Reported on 02/13/2015 12/19/14   Modena Jansky, MD  EPINEPHrine 0.3 mg/0.3 mL IJ SOAJ injection Inject 0.3 mLs (0.3 mg total) into the muscle once. 06/16/14   Arnoldo Morale, MD  fentaNYL (SUBLIMAZE) 100 MCG/2ML injection Inject 1 mL (50 mcg total) into the vein every 2 (two) hours as needed for severe pain. Patient not taking: Reported on 02/13/2015 12/19/14   Modena Jansky, MD  LORazepam (ATIVAN) 0.5 MG tablet Take 1 tablet (0.5 mg total) by mouth every 6 (six) hours as needed for anxiety or sleep. Patient not taking: Reported on 02/13/2015 12/19/14   Modena Jansky, MD  mirtazapine (REMERON) 15 MG tablet Take 1 tablet (15 mg total) by mouth at bedtime. Patient not taking: Reported on 02/13/2015 12/19/14   Modena Jansky, MD  ondansetron (ZOFRAN ODT) 4 MG disintegrating tablet Take 1 tablet (4 mg total) by  mouth every 8 (eight) hours as needed for nausea or vomiting. Patient not taking: Reported on 02/13/2015 12/05/14   Debbe Odea, MD  oxyCODONE (ROXICODONE) 15 MG immediate release tablet Take 1 tablet (15 mg total) by mouth every 3 (three) hours as needed for moderate pain. Patient not taking: Reported on 02/13/2015 12/19/14   Modena Jansky, MD  polyethylene glycol (MIRALAX / GLYCOLAX) packet Take 17 g by mouth 2 (two) times daily. Patient not taking: Reported on 02/13/2015 12/19/14   Modena Jansky, MD  predniSONE (DELTASONE) 20 MG tablet Take 2 tablets (40 mg total) by mouth daily. 02/13/15   Marella Chimes, PA-C  senna (SENOKOT) 8.6 MG TABS tablet Take 2 tablets (17.2 mg total) by mouth 2 (two) times daily. Patient not taking: Reported on 02/13/2015 12/19/14  Modena Jansky, MD  sodium phosphate (FLEET) 7-19 GM/118ML ENEM Place 133 mLs (1 enema total) rectally daily as needed for moderate constipation. Patient not taking: Reported on 02/13/2015 12/19/14   Modena Jansky, MD    BP 101/59 mmHg  Pulse 73  Temp(Src) 98.6 F (37 C) (Oral)  Resp 14  SpO2 100%  LMP 12/26/2014 Physical Exam  Constitutional: She is oriented to person, place, and time. She appears well-developed and well-nourished. No distress.  HENT:  Head: Normocephalic and atraumatic.  Right Ear: External ear normal.  Left Ear: External ear normal.  Nose: Nose normal.  Mouth/Throat: Uvula is midline, oropharynx is clear and moist and mucous membranes are normal.  Eyes: Conjunctivae, EOM and lids are normal. Pupils are equal, round, and reactive to light. Right eye exhibits no discharge. Left eye exhibits no discharge. No scleral icterus.  Neck: Normal range of motion. Neck supple.  Cardiovascular: Normal rate, regular rhythm, normal heart sounds, intact distal pulses and normal pulses.   Pulmonary/Chest: Effort normal and breath sounds normal. No respiratory distress. She has no wheezes. She has no rales.   Abdominal: Soft. Normal appearance and bowel sounds are normal. She exhibits no distension and no mass. There is tenderness. There is no rigidity, no rebound and no guarding.  TTP to right mid abdomen. No rebound, guarding, or masses. Well healing wound superior to umbilicus with packing and dressing in place.  Genitourinary: Rectal exam shows no external hemorrhoid, no internal hemorrhoid, no fissure, no mass and no tenderness.  Musculoskeletal: Normal range of motion. She exhibits no edema or tenderness.  Neurological: She is alert and oriented to person, place, and time. She has normal strength. No sensory deficit.  Skin: Skin is warm, dry and intact. No rash noted. She is not diaphoretic. No erythema. No pallor.  Psychiatric: She has a normal mood and affect. Her speech is normal and behavior is normal.  Nursing note and vitals reviewed.   ED Course  Procedures (including critical care time)  Labs Review Labs Reviewed  LIPASE, BLOOD - Abnormal; Notable for the following:    Lipase 53 (*)    All other components within normal limits  COMPREHENSIVE METABOLIC PANEL - Abnormal; Notable for the following:    Sodium 134 (*)    CO2 20 (*)    Glucose, Bld 100 (*)    Total Protein 5.9 (*)    ALT 12 (*)    All other components within normal limits  CBC - Abnormal; Notable for the following:    Hemoglobin 9.3 (*)    HCT 30.3 (*)    MCV 77.3 (*)    MCH 23.7 (*)    RDW 16.1 (*)    All other components within normal limits  I-STAT CG4 LACTIC ACID, ED - Abnormal; Notable for the following:    Lactic Acid, Venous 0.36 (*)    All other components within normal limits  I-STAT BETA HCG BLOOD, ED (MC, WL, AP ONLY)  POC OCCULT BLOOD, ED  I-STAT CG4 LACTIC ACID, ED    Imaging Review Ct Abdomen Pelvis W Contrast  02/13/2015  CLINICAL DATA:  24 year old female with left lower quadrant pain following surgery for Crohn disease this year. Previous abscess requiring drainage. Subsequent  encounter. EXAM: CT ABDOMEN AND PELVIS WITH CONTRAST TECHNIQUE: Multidetector CT imaging of the abdomen and pelvis was performed using the standard protocol following bolus administration of intravenous contrast. CONTRAST:  80 mL Omnipaque 300 COMPARISON:  Small bowel follow-through 12/14/2014. CT  Abdomen and Pelvis 12/10/2014 and earlier. FINDINGS: Lower lung volumes. Streaky and patchy bilateral lower lobe pulmonary opacity slightly greater on the right. No associated consolidation or pleural effusion. No pericardial effusion. No acute osseous abnormality identified. No pelvic free fluid. Negative rectum. Urinary bladder mildly distended and within normal limits. Uterus and adnexal within normal limits for age ; physiologic right adnexal cyst suspected on series 3, image 10. Mild stranding along the superior aspect of the uterus and adnexa favored to be postoperative in nature. The distal sigmoid colon is decompressed with no definite inflammation. Proximal sigmoid colon extends to the level of the umbilicus and is redundant. It is mildly to moderately gas distended with no wall thickening. Negative left colon and splenic flexure. Negative transverse colon. Retained stool in the right colon. Postoperative changes about the cecum with neo terminal ileum which appears capacious. This is a side-to-side type anastomosis and is new since October. Oral contrast has just reached the cecum. No dilated or definitely inflamed small bowel loops. Negative stomach and duodenum. No pneumoperitoneum. Liver, gallbladder, spleen, pancreas and adrenal glands are within normal limits. Portal venous system is patent. Major arterial structures in the abdomen and pelvis are patent. Bilateral renal enhancement within normal limits. Postoperative changes to the ventral abdominal wall with no postoperative fluid collection identified. No lymphadenopathy identified. IMPRESSION: 1. Right lower quadrant bowel surgery since October with neo  terminal ileum and side-to-side type anastomosis. Postoperative stranding with no definite inflamed bowel loops. No obstruction. No free fluid or fluid collection in the abdomen or pelvis. 2. Nonspecific streaky bilateral lower lobe opacity. Favor atelectasis but developing infection difficult to exclude. Electronically Signed   By: Genevie Ann M.D.   On: 02/13/2015 14:45     I have personally reviewed and evaluated these images and lab results as part of my medical decision-making.   EKG Interpretation None      MDM   Final diagnoses:  Abdominal pain  Abdominal pain    24 year old female presents with abdominal pain, nausea, and diarrhea, which she states started Friday. She reports subjective fever and chills. She states she noticed blood in her stool yesterday and today. She denies dysuria, urgency, frequency, vaginal discharge.   Patient is afebrile. Vital signs stable. Heart regular rate and rhythm. Lungs clear to auscultation bilaterally. Abdomen soft, nondistended, with mild tenderness to palpation in right mid abdomen. No rebound, guarding, or masses. Midline incision appears clean and dry with packing and dressing in place. No erythema or edema to suggest infection.  Patient given fluids, antiemetics, and pain medication in the ED. Labs as above pending. Will obtain CT abdomen and pelvis given history of recent surgery complicated by abscess.  CBC negative for leukocytosis, hemoglobin 9.3. CMP unremarkable. Lipase elevated at 53. Lactic acid within normal limits. Beta hCG negative. Hemoccult negative. Imaging remarkable for right lower quadrant bowel surgery since October with near terminal ileum and side-to-side anastomosis, postoperative stranding with no definite inflamed bowel loops, no obstruction, no free fluid or fluid collection in the abdomen or pelvis.  GI consulted for follow-up. Spoke with Luz Brazen ([facility], patient had requested she follow-up with Dr. Havery Moros),  who advised patient should follow-up with GI at [facility] given her complicated PMH. [facility] contacted. Spoke with Dr. Roney Mans (on call physician for GI), who advised they will see the patient in follow-up and will call her for an appointment. Will discharge with short steroid course. Patient is nontoxic and well-appearing, feel she is stable for discharge  at this time. Return precautions discussed. Patient verbalizes her understanding and is in agreement with plan.  BP 101/59 mmHg  Pulse 73  Temp(Src) 98.6 F (37 C) (Oral)  Resp 14  SpO2 100%  LMP 12/26/2014   Marella Chimes, PA-C 02/13/15 [place], MD 02/13/15 2250

## 2015-02-15 MED ORDER — IOHEXOL 300 MG/ML  SOLN
80.0000 mL | Freq: Once | INTRAMUSCULAR | Status: AC | PRN
  Administered 2015-02-13: 80 mL via INTRAVENOUS

## 2015-02-28 ENCOUNTER — Ambulatory Visit: Payer: Medicaid Other | Admitting: Gastroenterology

## 2015-03-12 ENCOUNTER — Emergency Department (HOSPITAL_COMMUNITY): Payer: Medicaid Other

## 2015-03-12 ENCOUNTER — Inpatient Hospital Stay (HOSPITAL_COMMUNITY)
Admission: EM | Admit: 2015-03-12 | Discharge: 2015-03-14 | DRG: 176 | Disposition: A | Discharge status: Home / Self Care | Payer: Medicaid Other | Attending: Student in an Organized Health Care Education/Training Program | Admitting: Student in an Organized Health Care Education/Training Program

## 2015-03-12 ENCOUNTER — Encounter (HOSPITAL_COMMUNITY): Payer: Self-pay | Admitting: Emergency Medicine

## 2015-03-12 DIAGNOSIS — N920 Excessive and frequent menstruation with regular cycle: Secondary | ICD-10-CM | POA: Diagnosis present

## 2015-03-12 DIAGNOSIS — Z7952 Long term (current) use of systemic steroids: Secondary | ICD-10-CM

## 2015-03-12 DIAGNOSIS — Z6835 Body mass index (BMI) 35.0-35.9, adult: Secondary | ICD-10-CM

## 2015-03-12 DIAGNOSIS — I959 Hypotension, unspecified: Secondary | ICD-10-CM | POA: Diagnosis not present

## 2015-03-12 DIAGNOSIS — D6859 Other primary thrombophilia: Secondary | ICD-10-CM | POA: Diagnosis present

## 2015-03-12 DIAGNOSIS — I2699 Other pulmonary embolism without acute cor pulmonale: Secondary | ICD-10-CM | POA: Diagnosis not present

## 2015-03-12 DIAGNOSIS — Z7901 Long term (current) use of anticoagulants: Secondary | ICD-10-CM | POA: Diagnosis not present

## 2015-03-12 DIAGNOSIS — D509 Iron deficiency anemia, unspecified: Secondary | ICD-10-CM | POA: Diagnosis present

## 2015-03-12 DIAGNOSIS — G40909 Epilepsy, unspecified, not intractable, without status epilepticus: Secondary | ICD-10-CM | POA: Diagnosis present

## 2015-03-12 DIAGNOSIS — I2690 Septic pulmonary embolism without acute cor pulmonale: Secondary | ICD-10-CM | POA: Diagnosis not present

## 2015-03-12 DIAGNOSIS — F17210 Nicotine dependence, cigarettes, uncomplicated: Secondary | ICD-10-CM | POA: Diagnosis present

## 2015-03-12 DIAGNOSIS — G894 Chronic pain syndrome: Secondary | ICD-10-CM | POA: Diagnosis present

## 2015-03-12 DIAGNOSIS — R079 Chest pain, unspecified: Secondary | ICD-10-CM | POA: Diagnosis present

## 2015-03-12 DIAGNOSIS — K5090 Crohn's disease, unspecified, without complications: Secondary | ICD-10-CM | POA: Diagnosis present

## 2015-03-12 DIAGNOSIS — Z79899 Other long term (current) drug therapy: Secondary | ICD-10-CM

## 2015-03-12 DIAGNOSIS — F1120 Opioid dependence, uncomplicated: Secondary | ICD-10-CM | POA: Diagnosis present

## 2015-03-12 DIAGNOSIS — Z8673 Personal history of transient ischemic attack (TIA), and cerebral infarction without residual deficits: Secondary | ICD-10-CM

## 2015-03-12 DIAGNOSIS — J45909 Unspecified asthma, uncomplicated: Secondary | ICD-10-CM | POA: Diagnosis present

## 2015-03-12 DIAGNOSIS — Z91030 Bee allergy status: Secondary | ICD-10-CM | POA: Diagnosis not present

## 2015-03-12 DIAGNOSIS — Z9119 Patient's noncompliance with other medical treatment and regimen: Secondary | ICD-10-CM | POA: Diagnosis not present

## 2015-03-12 HISTORY — DX: Unspecified asthma, uncomplicated: J45.909

## 2015-03-12 LAB — TROPONIN I: Troponin I: <0.03 ng/mL (ref <0.031)

## 2015-03-12 LAB — CBC WITH DIFFERENTIAL/PLATELET
Basophils Absolute: 0 10*3/uL (ref 0.0–0.1)
Basophils Relative: 0 %
Eosinophils Absolute: 0.1 10*3/uL (ref 0.0–0.7)
Eosinophils Relative: 1 %
HCT: 30.4 % — ABNORMAL LOW (ref 36.0–46.0)
Hemoglobin: 9.2 g/dL — ABNORMAL LOW (ref 12.0–15.0)
Lymphocytes Relative: 25 %
Lymphs Abs: 1.5 10*3/uL (ref 0.7–4.0)
MCH: 22 pg — ABNORMAL LOW (ref 26.0–34.0)
MCHC: 30.3 g/dL (ref 30.0–36.0)
MCV: 72.7 fL — ABNORMAL LOW (ref 78.0–100.0)
Monocytes Absolute: 0.3 10*3/uL (ref 0.1–1.0)
Monocytes Relative: 6 %
Neutro Abs: 3.9 10*3/uL (ref 1.7–7.7)
Neutrophils Relative %: 68 %
Platelets: 193 10*3/uL (ref 150–400)
RBC: 4.18 MIL/uL (ref 3.87–5.11)
RDW: 17.7 % — ABNORMAL HIGH (ref 11.5–15.5)
WBC: 5.8 10*3/uL (ref 4.0–10.5)

## 2015-03-12 LAB — I-STAT CHEM 8, ED
BUN: 22 mg/dL — ABNORMAL HIGH (ref 6–20)
Calcium, Ion: 1.24 mmol/L — ABNORMAL HIGH (ref 1.12–1.23)
Chloride: 105 mmol/L (ref 101–111)
Creatinine, Ser: 0.6 mg/dL (ref 0.44–1.00)
Glucose, Bld: 90 mg/dL (ref 65–99)
HCT: 32 % — ABNORMAL LOW (ref 36.0–46.0)
Hemoglobin: 10.9 g/dL — ABNORMAL LOW (ref 12.0–15.0)
Potassium: 4.6 mmol/L (ref 3.5–5.1)
Sodium: 137 mmol/L (ref 135–145)
TCO2: 23 mmol/L (ref 0–100)

## 2015-03-12 LAB — BASIC METABOLIC PANEL
Anion gap: 10 (ref 5–15)
BUN: 16 mg/dL (ref 6–20)
CO2: 21 mmol/L — ABNORMAL LOW (ref 22–32)
Calcium: 9.2 mg/dL (ref 8.9–10.3)
Chloride: 104 mmol/L (ref 101–111)
Creatinine, Ser: 0.6 mg/dL (ref 0.44–1.00)
GFR calc Af Amer: >60 mL/min (ref >60)
GFR calc non Af Amer: >60 mL/min (ref >60)
Glucose, Bld: 84 mg/dL (ref 65–99)
Potassium: 4.6 mmol/L (ref 3.5–5.1)
Sodium: 135 mmol/L (ref 135–145)

## 2015-03-12 LAB — HEPARIN LEVEL (UNFRACTIONATED): Heparin Unfractionated: 0.59 IU/mL (ref 0.30–0.70)

## 2015-03-12 LAB — FERRITIN: Ferritin: 5 ng/mL — ABNORMAL LOW (ref 11–307)

## 2015-03-12 LAB — RETICULOCYTES
RBC.: 3.71 MIL/uL — ABNORMAL LOW (ref 3.87–5.11)
Retic Count, Absolute: 40.8 10*3/uL (ref 19.0–186.0)
Retic Ct Pct: 1.1 % (ref 0.4–3.1)

## 2015-03-12 LAB — PROTIME-INR
INR: 1.02 (ref 0.00–1.49)
Prothrombin Time: 13.6 seconds (ref 11.6–15.2)

## 2015-03-12 MED ORDER — ONDANSETRON HCL 4 MG PO TABS: 4.0000 mg | ORAL_TABLET | Freq: Four times a day (QID) | ORAL | Status: DC | PRN

## 2015-03-12 MED ORDER — KETOROLAC TROMETHAMINE 30 MG/ML IJ SOLN
30.0000 mg | Freq: Once | INTRAMUSCULAR | Status: DC
  Filled 2015-03-12: qty 1

## 2015-03-12 MED ORDER — OXYCODONE-ACETAMINOPHEN 5-325 MG PO TABS
1.0000 | ORAL_TABLET | Freq: Once | ORAL | Status: AC
  Administered 2015-03-12: 1 via ORAL
  Filled 2015-03-12: qty 1

## 2015-03-12 MED ORDER — ACETAMINOPHEN 650 MG RE SUPP
650.0000 mg | Freq: Four times a day (QID) | RECTAL | Status: DC | PRN
Start: 2015-03-12 — End: 2015-03-14

## 2015-03-12 MED ORDER — ACETAMINOPHEN 325 MG PO TABS
650.0000 mg | ORAL_TABLET | Freq: Four times a day (QID) | ORAL | Status: DC | PRN
  Filled 2015-03-12: qty 2

## 2015-03-12 MED ORDER — SENNOSIDES-DOCUSATE SODIUM 8.6-50 MG PO TABS: 1.0000 | ORAL_TABLET | Freq: Every evening | ORAL | Status: DC | PRN

## 2015-03-12 MED ORDER — ONDANSETRON HCL 4 MG/2ML IJ SOLN
4.0000 mg | Freq: Four times a day (QID) | INTRAMUSCULAR | Status: DC | PRN
  Administered 2015-03-12 – 2015-03-14 (×3): 4 mg via INTRAVENOUS
  Filled 2015-03-12 (×3): qty 2

## 2015-03-12 MED ORDER — OXYCODONE HCL 5 MG PO TABS
5.0000 mg | ORAL_TABLET | ORAL | Status: DC | PRN
  Administered 2015-03-12 – 2015-03-13 (×4): 5 mg via ORAL
  Filled 2015-03-12 (×4): qty 1

## 2015-03-12 MED ORDER — PROCHLORPERAZINE EDISYLATE 5 MG/ML IJ SOLN
10.0000 mg | Freq: Once | INTRAMUSCULAR | Status: DC
  Filled 2015-03-12 (×2): qty 2

## 2015-03-12 MED ORDER — HYDROCODONE-ACETAMINOPHEN 5-325 MG PO TABS: 1.0000 | ORAL_TABLET | ORAL | Status: DC | PRN

## 2015-03-12 MED ORDER — KETOROLAC TROMETHAMINE 60 MG/2ML IM SOLN
60.0000 mg | Freq: Once | INTRAMUSCULAR | Status: AC
  Administered 2015-03-12: 60 mg via INTRAMUSCULAR
  Filled 2015-03-12: qty 2

## 2015-03-12 MED ORDER — IOHEXOL 350 MG/ML SOLN
80.0000 mL | Freq: Once | INTRAVENOUS | Status: AC | PRN
  Administered 2015-03-12: 80 mL via INTRAVENOUS

## 2015-03-12 MED ORDER — SODIUM CHLORIDE 0.9 % IJ SOLN
3.0000 mL | Freq: Two times a day (BID) | INTRAMUSCULAR | Status: DC
  Administered 2015-03-13: 3 mL via INTRAVENOUS

## 2015-03-12 MED ORDER — FENTANYL CITRATE (PF) 100 MCG/2ML IJ SOLN
100.0000 ug | Freq: Once | INTRAMUSCULAR | Status: AC
  Administered 2015-03-12: 100 ug via INTRAVENOUS
  Filled 2015-03-12: qty 2

## 2015-03-12 MED ORDER — HEPARIN BOLUS VIA INFUSION
4000.0000 [IU] | Freq: Once | INTRAVENOUS | Status: AC
  Administered 2015-03-12: 4000 [IU] via INTRAVENOUS
  Filled 2015-03-12: qty 4000

## 2015-03-12 MED ORDER — DIPHENHYDRAMINE HCL 50 MG/ML IJ SOLN: 12.5000 mg | Freq: Once | INTRAMUSCULAR | Status: DC

## 2015-03-12 MED ORDER — HEPARIN (PORCINE) IN NACL 100-0.45 UNIT/ML-% IJ SOLN
1100.0000 [IU]/h | INTRAMUSCULAR | Status: DC
  Administered 2015-03-12 – 2015-03-13 (×2): 1100 [IU]/h via INTRAVENOUS
  Filled 2015-03-12 (×2): qty 250

## 2015-03-12 MED ORDER — SODIUM CHLORIDE 0.9 % IV SOLN: 250.0000 mL | INTRAVENOUS | Status: DC | PRN

## 2015-03-12 MED ORDER — LEVETIRACETAM 500 MG PO TABS
1000.0000 mg | ORAL_TABLET | Freq: Once | ORAL | Status: AC
  Administered 2015-03-12: 1000 mg via ORAL
  Filled 2015-03-12: qty 2

## 2015-03-12 MED ORDER — KETOROLAC TROMETHAMINE 15 MG/ML IJ SOLN
15.0000 mg | Freq: Four times a day (QID) | INTRAMUSCULAR | Status: DC | PRN
  Administered 2015-03-12 – 2015-03-14 (×6): 15 mg via INTRAVENOUS
  Filled 2015-03-12 (×6): qty 1

## 2015-03-12 MED ORDER — SODIUM CHLORIDE 0.9 % IJ SOLN: 3.0000 mL | INTRAMUSCULAR | Status: DC | PRN

## 2015-03-12 MED ORDER — KETOROLAC TROMETHAMINE 60 MG/2ML IM SOLN: 60.0000 mg | Freq: Once | INTRAMUSCULAR | Status: DC

## 2015-03-12 MED ORDER — SODIUM CHLORIDE 0.9 % IJ SOLN: 3.0000 mL | Freq: Two times a day (BID) | INTRAMUSCULAR | Status: DC

## 2015-03-12 MED ORDER — LEVETIRACETAM 500 MG PO TABS
500.0000 mg | ORAL_TABLET | Freq: Two times a day (BID) | ORAL | Status: DC
  Administered 2015-03-13 – 2015-03-14 (×3): 500 mg via ORAL
  Filled 2015-03-12 (×3): qty 1

## 2015-03-12 MED ORDER — METHADONE HCL 5 MG PO TABS: 84.0000 mg | ORAL_TABLET | Freq: Every day | ORAL | Status: DC

## 2015-03-12 NOTE — ED Notes (Signed)
C. Lawyer, PA. at bedside. 

## 2015-03-12 NOTE — ED Notes (Signed)
Pt reports not taking eliquis for past week.

## 2015-03-12 NOTE — ED Notes (Signed)
Hermelinda Medicus PA at bedside.

## 2015-03-12 NOTE — ED Notes (Signed)
Admitting MD at bedside.

## 2015-03-12 NOTE — Progress Notes (Signed)
ANTICOAGULATION CONSULT NOTE - Follow Up Consult  Pharmacy Consult for Heparin Indication: pulmonary embolus  Allergies  Allergen Reactions  . Bee Venom Swelling    Patient Measurements: Height:  (157.5 cm) Weight: 175 lb (79.379 kg) IBW/kg (Calculated) : 50.1 Heparin Dosing Weight: 68 kg  Vital Signs: Temp: 99.8 F (37.7 C) (01/01 0824) Temp Source: Oral (01/01 0824) BP: 103/32 mmHg (01/01 1518) Pulse Rate: 72 (01/01 1518)  Labs:  Recent Labs  03/12/15 0920 03/12/15 1026 03/12/15 1606 03/12/15 1915  HGB 9.2* 10.9*  --   --   HCT 30.4* 32.0*  --   --   PLT 193  --   --   --   LABPROT 13.6  --   --   --   INR 1.02  --   --   --   HEPARINUNFRC  --   --   --  0.59  CREATININE 0.60 0.60  --   --   TROPONINI  --   --  <0.03  --     Estimated Creatinine Clearance: 104.9 mL/min (by C-G formula based on Cr of 0.6).   Medications:  Infusions:  . 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  She stopped taking Eliquis ~10 days ago. Today she presents with shortness of breath and is found to have an acute PE.  She was started on Heparin and her initial heparin level is therapeutic.  Goal of Therapy:  Heparin level 0.3-0.7 units/ml Monitor platelets by anticoagulation protocol: Yes   Plan:  Continue Heparin at 1100 units/hr Check Heparin level in 6 hours to confirm.  Estella Husk, Pharm.D., BCPS, AAHIVP Clinical Pharmacist Phone: [phone] or [phone] 03/12/2015, 8:05 PM

## 2015-03-12 NOTE — ED Notes (Signed)
Pt reports R sided CP that wraps around to back.  Pt reports pain worse with inhalation and movement.  Pt denies injury, trauma, long trips.  Pt tearful.  99% RA.

## 2015-03-12 NOTE — Progress Notes (Signed)
ANTICOAGULATION CONSULT NOTE - Initial Consult  Pharmacy Consult for heparin Indication: pulmonary embolus  Allergies  Allergen Reactions  . Bee Venom Swelling    Patient Measurements: Height: 5\' 2"  (157.5 cm) Weight: 175 lb (79.379 kg) IBW/kg (Calculated) : 50.1 Heparin Dosing Weight: 67.5 kg  Vital Signs: Temp: 99.8 F (37.7 C) (01/01 0824) Temp Source: Oral (01/01 0824) BP: 105/66 mmHg (01/01 1115) Pulse Rate: 84 (01/01 1115)  Labs:  Recent Labs  03/12/15 1026  HGB 10.9*  HCT 32.0*  CREATININE 0.60    Estimated Creatinine Clearance: 104.9 mL/min (by C-G formula based on Cr of 0.6).   Medical History: Past Medical History  Diagnosis Date  . Interstitial cystitis     pain from this was reason for starting opioids age 89.   . Chronic pelvic pain in female   . Headache(784.0)   . Pyelonephritis affecting pregnancy in first trimester July 2015  . Depression 2007  . Opioid dependence ([place])     to Rx opioids.  switched to Methadone after second child born in 04/2014  . Pneumonia 03/2014  . Seizures ([facility])   . Stroke [facility]) 05/2014    presented with right sided weakness.   . Numbness     right side  . 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 Preeclampsia 2012  . Miscarriage 2005  . Non-compliance     with anticoagulant  . 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 Cerebral venous sinus thrombosis   . Morbid obesity ([facility])     BMI 37 in 11/2014.  [name] Anemia 07/2014    Microcytic  . Hypercoagulable state, primary ([facility]) 07/2014    . no clear inherited or acquired cause for the patient's coagulopathy  . Asthma    Assessment: 25 yo F presents to ED with R sided chest pain that is worse with inhalation and movement. Patient reports not taking Eliquis for the past week. CT shows acute PE with evidence of R heart strain. Pharmacy consulted to dose heparin for treatment of pulmonary embolism.  H/H 10.9/32, No s/sx of bleeding noted.   Goal of Therapy:  Heparin level 0.3-0.7  units/ml Monitor platelets by anticoagulation protocol: Yes   Plan:  Give 4000 units bolus x 1 Start heparin infusion at 1100 units/hr Check anti-Xa level in 6 hours and daily while on heparin Continue to monitor H&H and platelets  Casilda Carls, PharmD. PGY-1 Pharmacy Resident Pager: [phone]  03/12/2015,12:22 PM

## 2015-03-12 NOTE — ED Notes (Signed)
IV attempt x 2 in left AC able to get blood but blows when flushed with Normal saline

## 2015-03-12 NOTE — ED Provider Notes (Signed)
CSN: [id number]     Arrival date & time 03/12/15  4098 History   First MD Initiated Contact with Patient 03/12/15 [phone]     Chief Complaint  Patient presents with  . Chest Pain     (Consider location/radiation/quality/duration/timing/severity/associated sxs/prior Treatment) HPI Patient presents to the emergency department with chest pain, shortness of breath that started early this morning.  The patient states that right sided chest pain with increased pain with breathing.  Patient states that she normally takes Eliquis, but stopped it 2 days ago.  The patient states that she has had a coagulable process from her childbirth 10 months ago.  The patient denies nausea, vomiting, weakness, dizziness, headache, blurred vision, back pain, neck pain, fever.  Incontinence, edema, lethargy, near syncope or syncope.  The patient did not take any medications prior to arrival.  The patient states that the pain does seem to radiate somewhat into her back Past Medical History  Diagnosis Date  . Interstitial cystitis     pain from this was reason for starting opioids age 89.   . Chronic pelvic pain in female   . Headache(784.0)   . Pyelonephritis affecting pregnancy in first trimester July 2015  . Depression 2007  . Opioid dependence (HCC)     to Rx opioids.  switched to Methadone after second child born in 04/2014  . Pneumonia 03/2014  . Seizures (HCC)   . Stroke [facility]) 05/2014    presented with right sided weakness.   . Numbness     right side  . 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 Preeclampsia 2012  . Miscarriage 2005  . Non-compliance     with anticoagulant  . 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 Cerebral venous sinus thrombosis   . Morbid obesity (HCC)     BMI 37 in 11/2014.  [name] Anemia 07/2014    Microcytic  . Hypercoagulable state, primary (HCC) 07/2014    . no clear inherited or acquired cause for the patient's coagulopathy   Past Surgical History  Procedure Laterality Date  . Vagina surgery  ~  2011    benign vaginal tumor removed at [facility].   . Cesarean section N/A 05/06/2014    Procedure: CESAREAN SECTION;  Surgeon: Levie Heritage, DO;  Location: [facility] ORS;  Service: Obstetrics;  Laterality: N/A;  . Inguinal hernia repair Left 2008  . Colonoscopy with propofol N/A 11/18/2014    Procedure: COLONOSCOPY WITH PROPOFOL;  Surgeon: Rachael Fee, MD;  Location: [facility] ENDOSCOPY;  Service: Endoscopy;  Laterality: N/A;  . Abdominal surgery     Family History  Problem Relation Age of Onset  . Diabetes Mother   . Heart disease Mother   . Hypertension Father   . Diabetes Maternal Grandmother   . Hypertension Maternal Grandmother   . Stroke Maternal Grandmother   . Diabetes Maternal Grandfather   . Hypertension Maternal Grandfather   . Diabetes Paternal Grandmother   . Diabetes Paternal Grandfather    Social History  Substance Use Topics  . Smoking status: Current Every Day Smoker -- 0.50 packs/day for 8 years    Types: Cigarettes  . Smokeless tobacco: Never Used     Comment: cutting back  . Alcohol Use: No   OB History    Gravida Para Term Preterm AB TAB SAB Ectopic Multiple Living   0 1 0 1 0 0 2     Review of Systems All other systems negative except as documented in the HPI. All  pertinent positives and negatives as reviewed in the HPI.   Allergies  Bee venom  Home Medications   Prior to Admission medications   Medication Sig Start Date End Date Taking? Authorizing Provider  apixaban (ELIQUIS) 5 MG TABS tablet Take 1 tablet (5 mg total) by mouth 2 (two) times daily. 10/16/14 02/03/15  Leroy Sea, MD  apixaban (ELIQUIS) 5 MG TABS tablet Take 5 mg by mouth 2 (two) times daily.    Historical Provider, MD  clonazePAM (KLONOPIN) 0.5 MG tablet Take 1 tablet (0.5 mg total) by mouth 2 (two) times daily. Patient not taking: Reported on 02/13/2015 12/19/14   Elease Etienne, MD  DULoxetine (CYMBALTA) 30 MG capsule Take 1 capsule (30 mg total) by mouth daily. Patient not  taking: Reported on 02/13/2015 12/19/14   Elease Etienne, MD  EPINEPHrine 0.3 mg/0.3 mL IJ SOAJ injection Inject 0.3 mLs (0.3 mg total) into the muscle once. 06/16/14   Jaclyn Shaggy, MD  fentaNYL (SUBLIMAZE) 100 MCG/2ML injection Inject 1 mL (50 mcg total) into the vein every 2 (two) hours as needed for severe pain. Patient not taking: Reported on 02/13/2015 12/19/14   Elease Etienne, MD  levETIRAcetam (KEPPRA) 500 MG tablet Take 1 tablet (500 mg total) by mouth 2 (two) times daily. 06/13/14   Richarda Overlie, MD  LORazepam (ATIVAN) 0.5 MG tablet Take 1 tablet (0.5 mg total) by mouth every 6 (six) hours as needed for anxiety or sleep. Patient not taking: Reported on 02/13/2015 12/19/14   Elease Etienne, MD  methadone (DOLOPHINE) 5 MG tablet Take 17 tablets (85 mg total) by mouth daily. Home medication continued Patient taking differently: Take 84 mg by mouth daily. Home medication continued. Patient states she takes 17 tablets every day per patient 10/16/14   Leroy Sea, MD  mirtazapine (REMERON) 15 MG tablet Take 1 tablet (15 mg total) by mouth at bedtime. Patient not taking: Reported on 02/13/2015 12/19/14   Elease Etienne, MD  ondansetron (ZOFRAN ODT) 4 MG disintegrating tablet Take 1 tablet (4 mg total) by mouth every 8 (eight) hours as needed for nausea or vomiting. Patient not taking: Reported on 02/13/2015 12/05/14   Calvert Cantor, MD  oxyCODONE (ROXICODONE) 15 MG immediate release tablet Take 1 tablet (15 mg total) by mouth every 3 (three) hours as needed for moderate pain. Patient not taking: Reported on 02/13/2015 12/19/14   Elease Etienne, MD  polyethylene glycol (MIRALAX / GLYCOLAX) packet Take 17 g by mouth 2 (two) times daily. Patient not taking: Reported on 02/13/2015 12/19/14   Elease Etienne, MD  predniSONE (DELTASONE) 20 MG tablet Take 2 tablets (40 mg total) by mouth daily. 02/13/15   Mady Gemma, PA-C  senna (SENOKOT) 8.6 MG TABS tablet Take 2 tablets (17.2 mg total) by  mouth 2 (two) times daily. Patient not taking: Reported on 02/13/2015 12/19/14   Elease Etienne, MD  sodium phosphate (FLEET) 7-19 GM/118ML ENEM Place 133 mLs (1 enema total) rectally daily as needed for moderate constipation. Patient not taking: Reported on 02/13/2015 12/19/14   Elease Etienne, MD   BP 129/68 mmHg  Pulse 90  Temp(Src) 99.8 F (37.7 C) (Oral)  Resp 18  Ht 5\' 2"  (1.575 m)  Wt 79.379 kg  BMI 32.00 kg/m2  SpO2 100%  LMP 02/22/2015  Breastfeeding? No Physical Exam  Constitutional: She is oriented to person, place, and time. She appears well-developed and well-nourished. No distress.  HENT:  Head: Normocephalic and  atraumatic.  Mouth/Throat: Oropharynx is clear and moist.  Eyes: Pupils are equal, round, and reactive to light.  Neck: Normal range of motion. Neck supple.  Cardiovascular: Normal rate, regular rhythm and normal heart sounds.  Exam reveals no gallop and no friction rub.   No murmur heard. Pulmonary/Chest: Effort normal and breath sounds normal. No respiratory distress. She has no wheezes. She exhibits tenderness.  Abdominal: Soft. Bowel sounds are normal. She exhibits no distension. There is no tenderness.  Neurological: She is alert and oriented to person, place, and time. She exhibits normal muscle tone. Coordination normal.  Skin: Skin is warm and dry. No rash noted. No erythema.  Psychiatric: She has a normal mood and affect. Her behavior is normal.  Nursing note and vitals reviewed.   ED Course  Procedures (including critical care time) Labs Review Labs Reviewed - No data to display  Imaging Review No results found. I have personally reviewed and evaluated these images and lab results as part of my medical decision-making.   EKG Interpretation None      Patient be admitted to the hospital with the, pulmonary embolus.  Patient is advised plan.  All questions were addressed with the [facility] who the patient for further  evaluation  CRITICAL CARE Performed by: Carlyle Dolly Total critical care time: Critical care time was exclusive of separately billable procedures and treating other patients. Critical care was necessary to treat or prevent imminent or life-threatening deterioration. Critical care was time spent personally by me on the following activities: development of treatment plan with patient and/or surrogate as well as nursing, discussions with consultants, evaluation of patient's response to treatment, examination of patient, obtaining history from patient or surrogate, ordering and performing treatments and interventions, ordering and review of laboratory studies, ordering and review of radiographic studies, pulse oximetry and re-evaluation of patient's condition.  Critical care, due to the fact that heparin was started on a patient with pulmonary embolus.  Patient was monitored closely  Charlestine Night, PA-C 03/16/15 1502  Raeford Razor, MD 03/17/15 1022

## 2015-03-12 NOTE — Progress Notes (Signed)
03/12/2015 8:30 PM Patient complained of pain in right upper chest with breathing.  The only pain med available was Toradol 15 mg IV every six hours as needed for pain which was already given at 1624.  Teaching Services was paged and Dr. Kyung Rudd ordered Oxycodone 5 mg by mouth every four hours as needed for severe pain.  Will give to patient and continue to monitor. Harriet Masson, RN

## 2015-03-12 NOTE — H&P (Signed)
Date: 03/12/2015               Patient Name:  Victoria Alvarez MRN: [id number]  DOB: 1990-09-03 Age / Sex: 25 y.o., female   PCP: Dessa Phi, MD         Medical Service: Internal Medicine Teaching Service         Attending Physician: Dr. Earl Lagos, MD    First Contact: Dr. Ruben Im Pager: [phone]  Second Contact: Dr. Griffin Basil Pager: [phone]       After Hours (After 5p/  First Contact Pager: [phone]  weekends / holidays): Second Contact Pager: [phone]   Chief Complaint: Chest Pain  History of Present Illness:   Ms. Houser is a 25 year old woman with a history of interstitial cystitis, seizure disorder, chronic pain syndrome on opioids, hypercoagulability state in the post-partum period involving venous sinus thormbosis and portal venous thrombus (on Eliquis) who presents with chest pain. The pain started the night of 12/30, which she described as a sharp pain starting on her right chest and radiating under her shoulder to her back. Over the next 24 hours, the pain intensified and she found she could not sleep at all and could only lie on her left side. She denies shortness of breath, but says her breathing has been shallow because she endorses chest pain with deep inspiration. She was supposed to be on long-term anticoagulation, but she has not taken any of her Eliquis in the past 1.5 weeks, because she's "tired of taking pills," including her Keppra. She denied any adverse effects from these medications. Of note, she said she felt a cramp in her left leg on the toilet several days ago which she had to "rub out," but she denied any calf swelling or tenderness. Associated symptoms have included a fever (101F) last night, although she denies chills or night sweats. She also endorses a migraine that resolved 48 hours ago, generalized weakness, and memory problems. She denied any light-headedness, nausea, vomiting, diarrhea, or dysuria. She denies any recent travel or prolonged  immobilization. She denies any family history of autoimmune or clotting disorders. She continues to smoke 1/2 ppd, unsuccessfully trying patches in the past, but she is interested in quitting. She does not drink or use illicit substances. Previous hypercoagulable workup has included anticardiolipin, lupus anticoagulant, beta2 glycoprotein, antithrombin, prothrombin, Factor V leiden, Protein C&S, all which have been negative. She was scheduled an appointment with a hematologist, but she did not make it to her appointment.  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 She underwent a laparoscopic ileocecectomy on 10/17 and was discharged on 11/3.  In the ED, she was satting 99% on room air. A CTA demonstrated an acute intermediate, at least submassive PE with right heart strain (RV/LV 0.97), possible endobronchial hemorrhage.      Meds: Current Facility-Administered Medications  Medication Dose Route Frequency Provider Last Rate Last Dose  . heparin ADULT infusion 100 units/mL (25000 units/250 mL)  1,100 Units/hr Intravenous Continuous Ancil Boozer, RPH 11 mL/hr at 03/12/15 1247 1,100 Units/hr at 03/12/15 1247   Current Outpatient Prescriptions  Medication Sig Dispense Refill  . apixaban (ELIQUIS) 5 MG TABS tablet Take 1 tablet (5 mg total) by mouth 2 (two) times daily. 60 tablet 0  .  levETIRAcetam (KEPPRA) 500 MG tablet Take 1 tablet (500 mg total) by mouth 2 (two) times daily. 60 tablet 3  . methadone (DOLOPHINE) 5 MG tablet Take 17 tablets (85 mg total) by mouth daily. Home medication continued (Patient taking differently: Take 84 mg by mouth daily. Home medication continued. Patient states she takes 17 tablets every day per patient) 30 tablet 0  . DULoxetine (CYMBALTA) 30 MG capsule  Take 1 capsule (30 mg total) by mouth daily. (Patient not taking: Reported on 02/13/2015)    . mirtazapine (REMERON) 15 MG tablet Take 1 tablet (15 mg total) by mouth at bedtime. (Patient not taking: Reported on 02/13/2015)    . predniSONE (DELTASONE) 20 MG tablet Take 2 tablets (40 mg total) by mouth daily. 10 tablet 0  . sodium phosphate (FLEET) 7-19 GM/118ML ENEM Place 133 mLs (1 enema total) rectally daily as needed for moderate constipation. (Patient not taking: Reported on 02/13/2015)      Allergies: Allergies as of 03/12/2015 - Review Complete 03/12/2015  Allergen Reaction Noted  . Bee venom Swelling 06/16/2014   Past Medical History  Diagnosis Date  . Interstitial cystitis     pain from this was reason for starting opioids age 25.   . Chronic pelvic pain in female   . Headache(784.0)   . Pyelonephritis affecting pregnancy in first trimester July 2015  . Depression 2007  . Opioid dependence (HCC)     to Rx opioids.  switched to Methadone after second child born in 04/2014  . Pneumonia 03/2014  . Seizures (HCC)   . Stroke [facility]) 05/2014    presented with right sided weakness.   . Numbness     right side  . 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 Preeclampsia 2012  . Miscarriage 2005  . Non-compliance     with anticoagulant  . 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 Cerebral venous sinus thrombosis   . Morbid obesity (HCC)     BMI 37 in 11/2014.  [name] Anemia 07/2014    Microcytic  . Hypercoagulable state, primary (HCC) 07/2014    . no clear inherited or acquired cause for the patient's coagulopathy  . Asthma    Past Surgical History  Procedure Laterality Date  . Vagina surgery  ~ 2011    benign vaginal tumor removed at [facility].   . Cesarean section N/A 05/06/2014    Procedure: CESAREAN SECTION;  Surgeon: Levie Heritage, DO;  Location: [facility] ORS;  Service: Obstetrics;  Laterality: N/A;  . Inguinal hernia repair Left 2008  . Colonoscopy with propofol N/A 11/18/2014    Procedure: COLONOSCOPY  WITH PROPOFOL;  Surgeon: Rachael Fee, MD;  Location: [facility] ENDOSCOPY;  Service: Endoscopy;  Laterality: N/A;  . Abdominal surgery     Family History  Problem Relation Age of Onset  . Diabetes Mother   . Heart disease Mother   . Hypertension Father   . Diabetes Maternal Grandmother   . Hypertension Maternal Grandmother   . Stroke Maternal Grandmother   . Diabetes Maternal Grandfather   . Hypertension Maternal Grandfather   . Diabetes Paternal Grandmother   . Diabetes Paternal Grandfather    Social History   Social History  . Marital Status: Single    Spouse Name: N/A  . Number of Children: 2  . Years of Education: 9   Occupational History  . homemaker    Social History Main Topics  . Smoking status: Current Every Day Smoker -- 0.50 packs/day for 8 years  Types: Cigarettes  . Smokeless tobacco: Never Used     Comment: cutting back  . Alcohol Use: No  . Drug Use: No     Comment: Pt on Methadone "was on pain pills; weaned off while I was pregnant so it would be safer"  . Sexual Activity: Yes    Birth Control/ Protection: None   Other Topics Concern  . Not on file   Social History Narrative   Has significant other   Right handed   Caffeine use - none    Review of Systems: Negative except per HPI  Physical Exam: Blood pressure 117/70, pulse 84, temperature 99.8 F (37.7 C), temperature source Oral, resp. rate 14, height  (1.575 m), weight 175 lb (79.379 kg), last menstrual period 02/22/2015, SpO2 98 %, not currently breastfeeding. General: Sitting up in bed, intermittently tearful but in no acute distress HEENT: Moist mucous membranes, EOMI, PERRL, No JVD, neck supple Cardiovascular: RRR, no m/r/g Pulmonary: Clear to ausculation bilaterally Abdominal: Obese. Soft, NT/ND. Normal bowel sounds Extremities: No calf swelling or tenderness. No clubbing, cyanosis, or edema. Neurological: Tongue midline, face symmetric. 5/5 strength in all  extremities. Psychiatric: Anxious appearing  Lab results: Basic Metabolic Panel:  Recent Labs  09/81/19 0920 03/12/15 1026  NA 135 137  K 4.6 4.6  CL 104 105  CO2 21*  --   GLUCOSE 84 90  BUN 16 22*  CREATININE 0.60 0.60  CALCIUM 9.2  --    CBC:  Recent Labs  03/12/15 0920 03/12/15 1026  WBC 5.8  --   NEUTROABS 3.9  --   HGB 9.2* 10.9*  HCT 30.4* 32.0*  MCV 72.7*  --   PLT 193  --    Coagulation:  Recent Labs  03/12/15 0920  LABPROT 13.6  INR 1.02    Imaging results:  Dg Chest 2 View  03/12/2015  CLINICAL DATA:  Acute right-sided chest pain. EXAM: CHEST  2 VIEW COMPARISON:  October 06, 2014. FINDINGS: The heart size and mediastinal contours are within normal limits. No pneumothorax or pleural effusion is noted. Minimal left basilar subsegmental atelectasis is noted laterally. Right middle lobe opacity is noted concerning for subsegmental atelectasis or possibly pneumonia. The visualized skeletal structures are unremarkable. IMPRESSION: Minimal left basilar subsegmental atelectasis. Mild right middle lobe subsegmental atelectasis or possibly pneumonia. Electronically Signed   By: Lupita Raider, M.D.   On: 03/12/2015 09:08   Ct Angio Chest Pe W/cm &/or Wo Cm  03/12/2015  CLINICAL DATA:  25 year old female with right-sided chest and back pain since yesterday. Shortness of breath. Difficulty taking deep inspirations. History of blood clots. EXAM: CT ANGIOGRAPHY CHEST WITH CONTRAST TECHNIQUE: Multidetector CT imaging of the chest was performed using the standard protocol during bolus administration of intravenous contrast. Multiplanar CT image reconstructions and MIPs were obtained to evaluate the vascular anatomy. CONTRAST:  80mL OMNIPAQUE IOHEXOL 350 MG/ML SOLN COMPARISON:  No priors. FINDINGS: Mediastinum/Lymph Nodes: In the right lower lobe pulmonary artery extending into multiple segmental and subsegmental sized branches to the basal segments, there is a large filling defect  which appears nonocclusive. Heart size is mildly enlarged. Right ventricular diameter of 36 mm. Left ventricular diameter of 37 mm. RV to LV ratio of 0.97. There is no significant pericardial fluid, thickening or pericardial calcification. No pathologically enlarged mediastinal or hilar lymph nodes. Esophagus is unremarkable in appearance. No axillary lymphadenopathy. Lungs/Pleura: Airspace consolidation in the right lower lobe basal segments, likely to reflect hemorrhage from acute pulmonary  infarction. Some patchy areas of ground-glass attenuation are noted throughout the remainder of the lungs bilaterally, interspersed with areas of lucency. Scattered linear opacities throughout the lungs bilaterally, may reflect areas of subsegmental atelectasis or scarring. No pleural effusions. Upper Abdomen: Unremarkable. Musculoskeletal/Soft Tissues: There are no aggressive appearing lytic or blastic lesions noted in the visualized portions of the skeleton. Review of the MIP images confirms the above findings. IMPRESSION: 1. Positive for acute PE with CT evidence of right heart strain (RV/LV Ratio = 0.97) consistent with at least submassive (intermediate risk) PE. The presence of right heart strain has been associated with an increased risk of morbidity and mortality. Please activate Code PE by paging [phone]. Specifically, there is a large burden of clot involving the distal right lower lobe pulmonary artery extending into multiple segmental and subsegmental sized branches throughout the basal segments. This appears to be nonocclusive, however, there is evidence of hemorrhage in the right lower lobe which does suggest frank pulmonary infarction. 2. Additional areas of ground-glass attenuation are noted throughout the lungs bilaterally, which could reflect some endobronchial spread of hemorrhage. This is also interspersed with areas of lucency, which may suggest concurrent air trapping. 3. Mild cardiomegaly. Critical  Value/emergent results were called by telephone at the time of interpretation on 03/12/2015 at 12:04 pm to Dr. Charlestine Night, who verbally acknowledged these results. Electronically Signed   By: Trudie Reed M.D.   On: 03/12/2015 12:06    EKG: Normal sinus rhythm.    Assessment & Plan by Problem:  Unprovoked Submassive Pulmonary Embolism in setting of Hypercoagulable State: Patient has not been taking her Eliquis despite known hypercoagulable state. We have spoken with Interventional Radiology, who indicated that the typical cutoff for interventions is RV/LV ratio >1 with troponin leak. They recommended TTE for further characterization. Previous workup for hypercoag state has been unremarkable. - Heparin IV - TTE pending - Troponin pending - If RV strain on TTE or trop leak, consult PCCM - Toradol 15 mg IV q6h prn - Consider outpatient followup with hematologist - NPO in case intervention needed  Seizure Disorder: No recent seizures, but no Keppra for 1.5 weeks - Loading dose of Keppa per pharm  Chronic Pain Syndrome: Methadone 85 mg daily. She received her dose this morning. She will need to check in her dose.  Dispo: Disposition is deferred at this time, awaiting improvement of current medical problems. Anticipated discharge in approximately 4-5 day(s).   The patient does have a current PCP (Dessa Phi, MD) and does not need an [facility] hospital follow-up appointment after discharge.  The patient does have transportation limitations that hinder transportation to clinic appointments.  Signed: Ruben Im, MD 03/12/2015, 2:55 PM

## 2015-03-13 ENCOUNTER — Inpatient Hospital Stay (HOSPITAL_COMMUNITY): Payer: Medicaid Other

## 2015-03-13 DIAGNOSIS — I2690 Septic pulmonary embolism without acute cor pulmonale: Secondary | ICD-10-CM

## 2015-03-13 LAB — BASIC METABOLIC PANEL
Anion gap: 10 (ref 5–15)
BUN: 11 mg/dL (ref 6–20)
CO2: 21 mmol/L — ABNORMAL LOW (ref 22–32)
Calcium: 9.4 mg/dL (ref 8.9–10.3)
Chloride: 107 mmol/L (ref 101–111)
Creatinine, Ser: 0.74 mg/dL (ref 0.44–1.00)
GFR calc Af Amer: >60 mL/min (ref >60)
GFR calc non Af Amer: >60 mL/min (ref >60)
Glucose, Bld: 108 mg/dL — ABNORMAL HIGH (ref 65–99)
Potassium: 3.5 mmol/L (ref 3.5–5.1)
Sodium: 138 mmol/L (ref 135–145)

## 2015-03-13 LAB — HEPARIN LEVEL (UNFRACTIONATED)
Heparin Unfractionated: 0.49 IU/mL (ref 0.30–0.70)
Heparin Unfractionated: 0.67 IU/mL (ref 0.30–0.70)

## 2015-03-13 MED ORDER — POLYETHYLENE GLYCOL 3350 17 G PO PACK
17.0000 g | PACK | Freq: Every day | ORAL | Status: DC
  Administered 2015-03-13 – 2015-03-14 (×2): 17 g via ORAL
  Filled 2015-03-13 (×2): qty 1

## 2015-03-13 MED ORDER — FERROUS SULFATE 325 (65 FE) MG PO TABS
325.0000 mg | ORAL_TABLET | Freq: Three times a day (TID) | ORAL | Status: DC
  Administered 2015-03-13 – 2015-03-14 (×4): 325 mg via ORAL
  Filled 2015-03-13 (×4): qty 1

## 2015-03-13 MED ORDER — SODIUM CHLORIDE 0.9 % IV SOLN: INTRAVENOUS | Status: DC

## 2015-03-13 MED ORDER — METHADONE HCL 10 MG PO TABS
84.0000 mg | ORAL_TABLET | Freq: Every day | ORAL | Status: DC
  Administered 2015-03-13 – 2015-03-14 (×2): 85 mg via ORAL
  Filled 2015-03-13: qty 1
  Filled 2015-03-13: qty 9

## 2015-03-13 MED ORDER — SODIUM CHLORIDE 0.9 % IV BOLUS (SEPSIS)
1000.0000 mL | Freq: Once | INTRAVENOUS | Status: AC
  Administered 2015-03-13: 1000 mL via INTRAVENOUS

## 2015-03-13 MED ORDER — OXYCODONE HCL 5 MG PO TABS
5.0000 mg | ORAL_TABLET | ORAL | Status: DC | PRN
  Administered 2015-03-13: 5 mg via ORAL
  Filled 2015-03-13: qty 1

## 2015-03-13 MED ORDER — SODIUM CHLORIDE 0.9 % IV SOLN
510.0000 mg | Freq: Once | INTRAVENOUS | Status: AC
  Administered 2015-03-13: 510 mg via INTRAVENOUS
  Filled 2015-03-13: qty 17

## 2015-03-13 MED ORDER — RIVAROXABAN 20 MG PO TABS
20.0000 mg | ORAL_TABLET | Freq: Every day | ORAL | Status: DC
Start: 2015-04-03 — End: 2015-03-14

## 2015-03-13 MED ORDER — RIVAROXABAN 15 MG PO TABS
15.0000 mg | ORAL_TABLET | Freq: Two times a day (BID) | ORAL | Status: DC
  Administered 2015-03-13 – 2015-03-14 (×3): 15 mg via ORAL
  Filled 2015-03-13 (×3): qty 1

## 2015-03-13 MED ORDER — OXYCODONE HCL 5 MG PO TABS
5.0000 mg | ORAL_TABLET | Freq: Once | ORAL | Status: AC | PRN
  Administered 2015-03-13: 5 mg via ORAL
  Filled 2015-03-13: qty 1

## 2015-03-13 MED ORDER — SODIUM CHLORIDE 0.9 % IV SOLN
INTRAVENOUS | Status: DC
  Administered 2015-03-13 (×2): via INTRAVENOUS

## 2015-03-13 NOTE — Progress Notes (Signed)
Pt was not getting enough pain relief from Toradol.  Teaching Services was called and ordered Oxycodone 5 mg by mouth every 4 hours as needed for severe pain.  Will continue to monitor pt. Lazaro Arms E,RN

## 2015-03-13 NOTE — Progress Notes (Signed)
Patient walked 350 ft. She de sat to 79% and HR went up to 105. She quickly regained her breath and got to 100% and carried a conversation and pain subsided. new to my practice Victoria Alvarez 6:25 PM

## 2015-03-13 NOTE — Progress Notes (Signed)
  Echocardiogram 2D Echocardiogram has been performed.  Victoria Alvarez 03/13/2015, 9:39 AM

## 2015-03-13 NOTE — Progress Notes (Signed)
UR Completed. Ashir Kunz, RN, BSN.  [phone] 

## 2015-03-13 NOTE — Progress Notes (Signed)
ANTICOAGULATION CONSULT NOTE - Follow Up Consult  Pharmacy Consult for Heparin Indication: pulmonary embolus  Allergies  Allergen Reactions  . Bee Venom Swelling    Patient Measurements: Height: 5\' 2"  (157.5 cm) Weight: 175 lb (79.379 kg) IBW/kg (Calculated) : 50.1 Heparin Dosing Weight: 68 kg  Vital Signs: Temp: 98.4 F (36.9 C) (01/02 0449) Temp Source: Oral (01/02 0449) BP: 96/34 mmHg (01/02 0449) Pulse Rate: 72 (01/02 0449)  Labs:  Recent Labs  03/12/15 0920 03/12/15 1026 03/12/15 1606 03/12/15 1915 03/13/15 0130 03/13/15 0853  HGB 9.2* 10.9*  --   --  8.7*  --   HCT 30.4* 32.0*  --   --  28.7*  --   PLT 193  --   --   --  199  --   LABPROT 13.6  --   --   --   --   --   INR 1.02  --   --   --   --   --   HEPARINUNFRC  --   --   --  0.59 0.49 0.67  CREATININE 0.60 0.60  --   --  0.74  --   TROPONINI  --   --  <0.03  --   --   --     Estimated Creatinine Clearance: 104.9 mL/min (by C-G formula based on Cr of 0.74).   Medications:  Infusions:  . 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  She stopped taking Eliquis ~10 days ago. She presented with shortness of breath and was found to have an acute PE with RHS.  She was started on Heparin and her follow up heparin level remains therapeutic at 0.67. RN reports no s/s of bleeding. H/H low, plt wnl  Goal of Therapy:  Heparin level 0.3-0.7 units/ml Monitor platelets by anticoagulation protocol: Yes   Plan:  - Continue heparin gtt 1100 units/hr - Daily heparin level and CBC  Lysle Pearl, PharmD, BCPS Pager # [phone] 03/13/2015 9:51 AM

## 2015-03-13 NOTE — Progress Notes (Signed)
ANTICOAGULATION CONSULT NOTE - Follow Up Consult  Pharmacy Consult for Heparin Indication: pulmonary embolus  Allergies  Allergen Reactions  . Bee Venom Swelling    Patient Measurements: Height: 5\' 2"  (157.5 cm) Weight: 175 lb (79.379 kg) IBW/kg (Calculated) : 50.1 Heparin Dosing Weight: 68 kg  Vital Signs: Temp: 98.3 F (36.8 C) (01/01 2024) Temp Source: Oral (01/01 2024) BP: 94/44 mmHg (01/01 2024) Pulse Rate: 64 (01/01 2024)  Labs:  Recent Labs  03/12/15 0920 03/12/15 1026 03/12/15 1606 03/12/15 1915 03/13/15 0130  HGB 9.2* 10.9*  --   --  8.7*  HCT 30.4* 32.0*  --   --  28.7*  PLT 193  --   --   --  199  LABPROT 13.6  --   --   --   --   INR 1.02  --   --   --   --   HEPARINUNFRC  --   --   --  0.59 0.49  CREATININE 0.60 0.60  --   --  0.74  TROPONINI  --   --  <0.03  --   --     Estimated Creatinine Clearance: 104.9 mL/min (by C-G formula based on Cr of 0.74).   Medications:  Infusions:  . 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  She stopped taking Eliquis ~10 days ago. She presented with shortness of breath and is found to have an acute PE.  She was started on Heparin and her follow up heparin level remains therapeutic at 0.49. RN reports no s/s of bleeding. H/H low, plt wnl  Goal of Therapy:  Heparin level 0.3-0.7 units/ml Monitor platelets by anticoagulation protocol: Yes   Plan:  Continue Heparin at 1100 units/hr Check daily HL  Vinnie Level, PharmD., BCPS Clinical Pharmacist Pager [phone]

## 2015-03-13 NOTE — Progress Notes (Signed)
Subjective: Victoria Alvarez continued to endorse persistent chest pain overnight requiring oxycodone IR 5 mg. She denied any shortness of breath. She is interested in trying Xarelto instead of Eliquis because of the eventual once daily dosing to help with adherence. She also said she has a history of Crohn's disease, and she cannot accurately recall he last seizure, although she said she only had it once.   Objective: Vital signs in last 24 hours: Filed Vitals:   03/12/15 1445 03/12/15 1518 03/12/15 2024 03/13/15 0449  BP: 117/70 103/32 94/44 96/34   Pulse: 84 72 64 72  Temp:   98.3 F (36.8 C) 98.4 F (36.9 C)  TempSrc:   Oral Oral  Resp: 14  16 16   Height:      Weight:      SpO2: 98% 98% 98% 98%   Weight change:   Intake/Output Summary (Last 24 hours) at 03/13/15 1047 Last data filed at 03/13/15 0745  Gross per 24 hour  Intake      0 ml  Output      0 ml  Net      0 ml   Physical Exam General: Lying in bed, NAD HEENT: Increased neck circumference. No scleral icterus Cardiovascular: RRR, no m/r/g Pulmonary: Clear to ausculation bilaterally Abdominal: Obese. Soft, NT/ND. Normal bowel sounds Extremities: No calf swelling or tenderness. No clubbing, cyanosis, or edema.  Lab Results: Basic Metabolic Panel:  Recent Labs Lab 03/12/15 0920 03/12/15 1026 03/13/15 0130  NA 135 137 138  K 4.6 4.6 3.5  CL 104 105 107  CO2 21*  --  21*  GLUCOSE 84 90 108*  BUN 16 22* 11  CREATININE 0.60 0.60 0.74  CALCIUM 9.2  --  9.4   CBC:  Recent Labs Lab 03/12/15 0920 03/12/15 1026 03/13/15 0130  WBC 5.8  --  5.4  NEUTROABS 3.9  --   --   HGB 9.2* 10.9* 8.7*  HCT 30.4* 32.0* 28.7*  MCV 72.7*  --  71.9*  PLT 193  --  199   Cardiac Enzymes:  Recent Labs Lab 03/12/15 1606  TROPONINI <0.03   Coagulation:  Recent Labs Lab 03/12/15 0920  LABPROT 13.6  INR 1.02   Anemia Panel:  Recent Labs Lab 03/12/15 1606  FERRITIN 5*  RETICCTPCT 1.1     Micro Results: No  results found for this or any previous visit (from the past 240 hour(s)). Studies/Results: Dg Chest 2 View  03/12/2015  CLINICAL DATA:  Acute right-sided chest pain. EXAM: CHEST  2 VIEW COMPARISON:  October 06, 2014. FINDINGS: The heart size and mediastinal contours are within normal limits. No pneumothorax or pleural effusion is noted. Minimal left basilar subsegmental atelectasis is noted laterally. Right middle lobe opacity is noted concerning for subsegmental atelectasis or possibly pneumonia. The visualized skeletal structures are unremarkable. IMPRESSION: Minimal left basilar subsegmental atelectasis. Mild right middle lobe subsegmental atelectasis or possibly pneumonia. Electronically Signed   By: Lupita Raider, M.D.   On: 03/12/2015 09:08   Ct Angio Chest Pe W/cm &/or Wo Cm  03/12/2015  CLINICAL DATA:  24 year old female with right-sided chest and back pain since yesterday. Shortness of breath. Difficulty taking deep inspirations. History of blood clots. EXAM: CT ANGIOGRAPHY CHEST WITH CONTRAST TECHNIQUE: Multidetector CT imaging of the chest was performed using the standard protocol during bolus administration of intravenous contrast. Multiplanar CT image reconstructions and MIPs were obtained to evaluate the vascular anatomy. CONTRAST:  70mL OMNIPAQUE IOHEXOL 350 MG/ML SOLN COMPARISON:  No priors. FINDINGS: Mediastinum/Lymph Nodes: In the right lower lobe pulmonary artery extending into multiple segmental and subsegmental sized branches to the basal segments, there is a large filling defect which appears nonocclusive. Heart size is mildly enlarged. Right ventricular diameter of 36 mm. Left ventricular diameter of 37 mm. RV to LV ratio of 0.97. There is no significant pericardial fluid, thickening or pericardial calcification. No pathologically enlarged mediastinal or hilar lymph nodes. Esophagus is unremarkable in appearance. No axillary lymphadenopathy. Lungs/Pleura: Airspace consolidation in the right  lower lobe basal segments, likely to reflect hemorrhage from acute pulmonary infarction. Some patchy areas of ground-glass attenuation are noted throughout the remainder of the lungs bilaterally, interspersed with areas of lucency. Scattered linear opacities throughout the lungs bilaterally, may reflect areas of subsegmental atelectasis or scarring. No pleural effusions. Upper Abdomen: Unremarkable. Musculoskeletal/Soft Tissues: There are no aggressive appearing lytic or blastic lesions noted in the visualized portions of the skeleton. Review of the MIP images confirms the above findings. IMPRESSION: 1. Positive for acute PE with CT evidence of right heart strain (RV/LV Ratio = 0.97) consistent with at least submassive (intermediate risk) PE. The presence of right heart strain has been associated with an increased risk of morbidity and mortality. Please activate Code PE by paging [phone]. Specifically, there is a large burden of clot involving the distal right lower lobe pulmonary artery extending into multiple segmental and subsegmental sized branches throughout the basal segments. This appears to be nonocclusive, however, there is evidence of hemorrhage in the right lower lobe which does suggest frank pulmonary infarction. 2. Additional areas of ground-glass attenuation are noted throughout the lungs bilaterally, which could reflect some endobronchial spread of hemorrhage. This is also interspersed with areas of lucency, which may suggest concurrent air trapping. 3. Mild cardiomegaly. Critical Value/emergent results were called by telephone at the time of interpretation on 03/12/2015 at 12:04 pm to Dr. Charlestine Night, who verbally acknowledged these results. Electronically Signed   By: Trudie Reed M.D.   On: 03/12/2015 12:06   Medications: I have reviewed the patient's current medications. Scheduled Meds: . ferrous sulfate  325 mg Oral TID WC  . levETIRAcetam  500 mg Oral BID  . methadone  85 mg  Oral Daily  . Rivaroxaban  15 mg Oral BID WC  . 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 Patient has not been taking her Eliquis despite known hypercoagulable state. We have spoken with Interventional Radiology, who indicated that the typical cutoff for interventions is RV/LV ratio >1 with troponin leak. Her troponin was negative.  Previous workup for hypercoag state has been unremarkable, although she may have possible Crohn's disease, although a discharge summary from 11/3 from [facility] in Care Everywhere for an ileal stricture reports that biopsy was negative for Crohn's colitis, although previous notes said this represented IBD. - Heparin IV, transition to Xarelto pending TTE results - Toradol 15 mg IV q6h prn - NPO in case intervention needed based on TTE - Walk today with pulse ox  Seizure Disorder: Patient had one witnessed seizure during a 06/2014 admission for a sinus venous thrombosis. No recent seizures, but no Keppra for 1.5 weeks - Keppra 500 mg  BID  Chronic Pain Syndrome: Methadone 85 mg daily. She continuing to have breakthrough chest pain. - Continue methadone dose - One oxycodone 5 mg if needed  Possible Crohn's disease: Unclear diagnosis based on review of outside and internal records. If confirmed, could explain her hypercoagulable state.   DVT Prophylaxis: On heparin IV  Dispo: Disposition is deferred at this time, awaiting improvement of current medical problems.  Anticipated discharge in approximately 0-4 day(s).   The patient does have a  current PCP (Victoria Phi, MD) and does not need an [facility] hospital follow-up appointment after discharge.  The patient does not have transportation limitations that hinder transportation to clinic appointments.  .Services Needed at time of discharge: Y = Yes, Blank = No PT:   OT:   RN:   Equipment:   Other:     LOS: 1 day   Ruben Im, MD 03/13/2015, 10:47 AM

## 2015-03-13 NOTE — Progress Notes (Signed)
03/13/2015 11:34 PM Pt's BP was at 84/40 manually and mentioned how she "did not feel right."  Stated that she was feeling some dizziness.  Dr.  Kyung Rudd was informed and he ordered one 1000 mL bolus.  Will continue to monitor pt. Victoria Masson, RN

## 2015-03-13 NOTE — Discharge Instructions (Addendum)
Victoria Alvarez, it was a pleasure taking care of you in the hospital. You were in the hospital for a pulmonary embolism, which is a clot in the lungs. You have a tendency to form clots, such as the cerebral clot and liver clot that you've had before. Because of this, you will need to be on drugs to to keep your blood from clotting from now on. The drug you'll be on is Xarelto (rivaroxaban). I strongly urge you to quit smoking to help prevent a clot from happening again.  You will take 15 mg Xarelto twice a day until April 02, 2015. After that, you will take Xarelto 20 mg once per day. DO NOT RUN OUT OF THIS MEDICATION. Clots can be fatal. If you have worsening chest pain, shortness of breath, or calf pain and swelling, please seek immediate medical attention.  For you GI issues, which may be Crohn's disease, I would encourage you to go to an appointment with Gastroenterology at [facility].  Your blood counts were noted to be low in the hospital, likely related to iron deficiency. I have prescribed you iron tablets to take at home.  For your vaginal bleeding, it is likely related to menstrual bleeding. I would encourage you you to follow-up with your gynecologist.   You will take 15 mg Xarelto twice a day until April 02, 2015. After that, you will take Xarelto 20 mg once per day. DO NOT RUN OUT OF THIS MEDICATION. Clots can be fatal. If you have worsening chest pain, shortness of breath, or calf pain and swelling, please seek immediate medical attention.   Information on my medicine - XARELTO (rivaroxaban)  This medication education was reviewed with me or my healthcare representative as part of my discharge preparation.    WHY WAS XARELTO PRESCRIBED FOR YOU? Xarelto was prescribed to treat blood clots that may have been found in the veins of your legs (deep vein thrombosis) or in your lungs (pulmonary embolism) and to reduce the risk of them occurring again.  What do you need  to know about Xarelto? The starting dose is one 15 mg tablet taken TWICE daily with food for the FIRST 21 DAYS then on (enter date) 1/23  the dose is changed to one 20 mg tablet taken ONCE A DAY with your evening meal.  DO NOT stop taking Xarelto without talking to the health care provider who prescribed the medication.  Refill your prescription for 20 mg tablets before you run out.  After discharge, you should have regular check-up appointments with your healthcare provider that is prescribing your Xarelto.  In the future your dose may need to be changed if your kidney function changes by a significant amount.  What do you do if you miss a dose? If you are taking Xarelto TWICE DAILY and you miss a dose, take it as soon as you remember. You may take two 15 mg tablets (total 30 mg) at the same time then resume your regularly scheduled 15 mg twice daily the next day.  If you are taking Xarelto ONCE DAILY and you miss a dose, take it as soon as you remember on the same day then continue your regularly scheduled once daily regimen the next day. Do not take two doses of Xarelto at the same time.   Important Safety Information Xarelto is a blood thinner medicine that can cause bleeding. You should call your healthcare provider right away if you experience any of the following: ? Bleeding  from an injury or your nose that does not stop. ? Unusual colored urine (red or dark brown) or unusual colored stools (red or black). ? Unusual bruising for unknown reasons. ? A serious fall or if you hit your head (even if there is no bleeding).  Some medicines may interact with Xarelto and might increase your risk of bleeding while on Xarelto. To help avoid this, consult your healthcare provider or pharmacist prior to using any new prescription or non-prescription medications, including herbals, vitamins, non-steroidal anti-inflammatory drugs (NSAIDs) and supplements.  This website has more information on  Xarelto: [email or web address].com.br.

## 2015-03-14 LAB — CBC
HCT: 28.7 % — ABNORMAL LOW (ref 36.0–46.0)
HCT: 29.3 % — ABNORMAL LOW (ref 36.0–46.0)
Hemoglobin: 8.7 g/dL — ABNORMAL LOW (ref 12.0–15.0)
Hemoglobin: 8.6 g/dL — ABNORMAL LOW (ref 12.0–15.0)
MCH: 21.8 pg — ABNORMAL LOW (ref 26.0–34.0)
MCH: 21.6 pg — ABNORMAL LOW (ref 26.0–34.0)
MCHC: 30.3 g/dL (ref 30.0–36.0)
MCHC: 29.4 g/dL — ABNORMAL LOW (ref 30.0–36.0)
MCV: 71.9 fL — ABNORMAL LOW (ref 78.0–100.0)
MCV: 73.4 fL — ABNORMAL LOW (ref 78.0–100.0)
Platelets: 199 10*3/uL (ref 150–400)
Platelets: 210 10*3/uL (ref 150–400)
RBC: 3.99 MIL/uL (ref 3.87–5.11)
RBC: 3.99 MIL/uL (ref 3.87–5.11)
RDW: 17.2 % — ABNORMAL HIGH (ref 11.5–15.5)
RDW: 17.3 % — ABNORMAL HIGH (ref 11.5–15.5)
WBC: 5.4 10*3/uL (ref 4.0–10.5)
WBC: 3.4 10*3/uL — ABNORMAL LOW (ref 4.0–10.5)

## 2015-03-14 LAB — BASIC METABOLIC PANEL
Anion gap: 9 (ref 5–15)
BUN: 6 mg/dL (ref 6–20)
CO2: 19 mmol/L — ABNORMAL LOW (ref 22–32)
Calcium: 8.4 mg/dL — ABNORMAL LOW (ref 8.9–10.3)
Chloride: 114 mmol/L — ABNORMAL HIGH (ref 101–111)
Creatinine, Ser: 0.71 mg/dL (ref 0.44–1.00)
GFR calc Af Amer: >60 mL/min (ref >60)
GFR calc non Af Amer: >60 mL/min (ref >60)
Glucose, Bld: 113 mg/dL — ABNORMAL HIGH (ref 65–99)
Potassium: 3.5 mmol/L (ref 3.5–5.1)
Sodium: 142 mmol/L (ref 135–145)

## 2015-03-14 LAB — MRSA PCR SCREENING: MRSA by PCR: NEGATIVE

## 2015-03-14 MED ORDER — SODIUM CHLORIDE 0.9 % IV BOLUS (SEPSIS)
1000.0000 mL | Freq: Once | INTRAVENOUS | Status: AC
  Administered 2015-03-14: 1000 mL via INTRAVENOUS

## 2015-03-14 MED ORDER — RIVAROXABAN 20 MG PO TABS: 20.0000 mg | ORAL_TABLET | Freq: Every day | ORAL | Status: DC

## 2015-03-14 MED ORDER — POLYETHYLENE GLYCOL 3350 17 G PO PACK
17.0000 g | PACK | Freq: Every day | ORAL | Status: DC
Start: 2015-03-14 — End: 2016-03-27

## 2015-03-14 MED ORDER — FERROUS SULFATE 325 (65 FE) MG PO TABS: 325.0000 mg | ORAL_TABLET | Freq: Three times a day (TID) | ORAL | Status: DC

## 2015-03-14 MED ORDER — ONDANSETRON HCL 4 MG PO TABS: 4.0000 mg | ORAL_TABLET | Freq: Four times a day (QID) | ORAL | Status: DC | PRN

## 2015-03-14 MED ORDER — RIVAROXABAN 15 MG PO TABS: 15.0000 mg | ORAL_TABLET | Freq: Two times a day (BID) | ORAL | Status: DC

## 2015-03-14 NOTE — Care Management Note (Signed)
Case Management Note  Patient Details  Name: Kalima Saylor MRN: [id number] Date of Birth: 02-09-1991  Subjective/Objective:    Pt admitted for Chest Pain and Pulmonary Embolism- plan for home on Xarelto.                 Action/Plan: CM did provide pt with 30 day free Xarelto Card- COst should be no more than $3.00 and pt is aware. [facility] on [address] has medication available. No further needs from CM at this time.    Expected Discharge Date:                  Expected Discharge Plan:  Home/Self Care  In-House Referral:  NA  Discharge planning Services  CM Consult, Medication Assistance  Post Acute Care Choice:  NA Choice offered to:  NA  DME Arranged:  N/A DME Agency:  NA  HH Arranged:  NA HH Agency:  NA  Status of Service:  Completed, signed off  Medicare Important Message Given:    Date Medicare IM Given:    Medicare IM give by:    Date Additional Medicare IM Given:    Additional Medicare Important Message give by:     If discussed at Long Length of Stay Meetings, dates discussed:    Additional Comments:  Gala Lewandowsky, RN 03/14/2015, 11:30 AM

## 2015-03-14 NOTE — Discharge Summary (Signed)
Name: Victoria Alvarez MRN: [id number] DOB: 01/02/1991 24 y.o. PCP: Dessa Phi, MD  Date of Admission: 03/12/2015  8:13 AM Date of Discharge: 03/14/2015 Attending Physician: Tyson Alias, MD  Discharge Diagnosis: 1. Unprovoked Submassive Pulmonary Embolism 2. Hypercoagulable State 3. Possible Crohn's Disease 4. Menorrhagia  Discharge Medications:   Medication List    STOP taking these medications        apixaban 5 MG Tabs tablet  Commonly known as:  ELIQUIS      TAKE these medications        ferrous sulfate 325 (65 FE) MG tablet  Take 1 tablet (325 mg total) by mouth 3 (three) times daily with meals.     levETIRAcetam 500 MG tablet  Commonly known as:  KEPPRA  Take 1 tablet (500 mg total) by mouth 2 (two) times daily.     methadone 5 MG tablet  Commonly known as:  DOLOPHINE  Take 17 tablets (85 mg total) by mouth daily. Home medication continued     ondansetron 4 MG tablet  Commonly known as:  ZOFRAN  Take 1 tablet (4 mg total) by mouth every 6 (six) hours as needed for nausea.     polyethylene glycol packet  Commonly known as:  MIRALAX / GLYCOLAX  Take 17 g by mouth daily.     Rivaroxaban 15 MG Tabs tablet  Commonly known as:  XARELTO  Take 1 tablet (15 mg total) by mouth 2 (two) times daily with a meal.     rivaroxaban 20 MG Tabs tablet  Commonly known as:  XARELTO  Take 1 tablet (20 mg total) by mouth daily with supper.  Start taking on:  04/03/2015        Disposition and follow-up:   [name] was discharged from [facility] in good condition.  At the hospital follow up visit please address:  1.  [name] had some menorrhagia while hospitalized. Her periods have been irregular since her birth 10 months ago. This may require follow-up with an OB/Gyn  2. Patient has a questionable history of Crohn's disease. No suspicion for Crohn's flare during this hospitalization, but this may require follow-up at [facility]  Gastroenterology. She may need to be on immunosuppressant if she indeed has Crohn's  3. Patient had iron deficiency anemia and needed feraheme transfusion as an inpatient, likely related to ileal surgery.  2.  Labs / imaging needed at time of follow-up: None  3.  Pending labs/ test needing follow-up: None  Follow-up Appointments:     Follow-up Information    Follow up with Lora Paula, MD. Go on 03/16/2015.   Specialty:  Family Medicine   Why:  at [facility] information:   [address]  [place] [address] [phone]       Follow up with [facility] In 1 week.   Why:  Menstrual bleeding   Contact information:   [address] [place] [address] [phone]      Follow up with [facility]. Call today.   Contact information:   [phone]      Discharge Instructions: Discharge Instructions    Diet - low sodium heart healthy    Complete by:  As directed      Increase activity slowly    Complete by:  As directed           [name], it was a pleasure taking care of you in the hospital. You  were in the hospital for a pulmonary embolism, which is a clot in the lungs. You have a tendency to form clots, such as the cerebral clot and liver clot that you've had before. Because of this, you will need to be on drugs to to keep your blood from clotting from now on. The drug you'll be on is Xarelto (rivaroxaban). I strongly urge you to quit smoking to help prevent a clot from happening again.  You will take 15 mg Xarelto twice a day until April 02, 2015. After that, you will take Xarelto 20 mg once per day. DO NOT RUN OUT OF THIS MEDICATION. Clots can be fatal. If you have worsening chest pain, shortness of breath, or calf pain and swelling, please seek immediate medical attention.  For you GI issues, which may be Crohn's disease, I would encourage you to go to an appointment with Gastroenterology at [facility].  Your blood counts were noted to be low in the hospital, likely related to iron deficiency. I have prescribed you iron tablets to take at home.  For your vaginal bleeding, it is likely related to menstrual bleeding. I would encourage you you to follow-up with your gynecologist.   You will take 15 mg Xarelto twice a day until April 02, 2015. After that, you will take Xarelto 20 mg once per day. DO NOT RUN OUT OF THIS MEDICATION. Clots can be fatal. If you have worsening chest pain, shortness of breath, or calf pain and swelling, please seek immediate medical attention.  Procedures Performed:  Dg Chest 2 View  03/12/2015  CLINICAL DATA:  Acute right-sided chest pain. EXAM: CHEST  2 VIEW COMPARISON:  October 06, 2014. FINDINGS: The heart size and mediastinal contours are within normal limits. No pneumothorax or pleural effusion is noted. Minimal left basilar subsegmental atelectasis is noted laterally. Right middle lobe opacity is noted concerning for subsegmental atelectasis or possibly pneumonia. The visualized skeletal structures are unremarkable. IMPRESSION: Minimal left basilar subsegmental atelectasis. Mild right middle lobe subsegmental atelectasis or possibly pneumonia. Electronically Signed   By: Lupita Raider, M.D.   On: 03/12/2015 09:08   Ct Angio Chest Pe W/cm &/or Wo Cm  03/12/2015  CLINICAL DATA:  25 year old female with right-sided chest and back pain since yesterday. Shortness of breath. Difficulty taking deep inspirations. History of blood clots. EXAM: CT ANGIOGRAPHY CHEST WITH CONTRAST TECHNIQUE: Multidetector CT imaging of the chest was performed using the standard protocol during bolus administration of intravenous contrast. Multiplanar CT image reconstructions and MIPs were obtained to evaluate the vascular anatomy. CONTRAST:  80mL OMNIPAQUE IOHEXOL 350 MG/ML SOLN COMPARISON:  No priors. FINDINGS: Mediastinum/Lymph Nodes: In the right lower lobe pulmonary artery  extending into multiple segmental and subsegmental sized branches to the basal segments, there is a large filling defect which appears nonocclusive. Heart size is mildly enlarged. Right ventricular diameter of 36 mm. Left ventricular diameter of 37 mm. RV to LV ratio of 0.97. There is no significant pericardial fluid, thickening or pericardial calcification. No pathologically enlarged mediastinal or hilar lymph nodes. Esophagus is unremarkable in appearance. No axillary lymphadenopathy. Lungs/Pleura: Airspace consolidation in the right lower lobe basal segments, likely to reflect hemorrhage from acute pulmonary infarction. Some patchy areas of ground-glass attenuation are noted throughout the remainder of the lungs bilaterally, interspersed with areas of lucency. Scattered linear opacities throughout the lungs bilaterally, may reflect areas of subsegmental atelectasis or scarring. No pleural effusions. Upper Abdomen: Unremarkable. Musculoskeletal/Soft Tissues: There are no aggressive  appearing lytic or blastic lesions noted in the visualized portions of the skeleton. Review of the MIP images confirms the above findings. IMPRESSION: 1. Positive for acute PE with CT evidence of right heart strain (RV/LV Ratio = 0.97) consistent with at least submassive (intermediate risk) PE. The presence of right heart strain has been associated with an increased risk of morbidity and mortality. Please activate Code PE by paging [phone]. Specifically, there is a large burden of clot involving the distal right lower lobe pulmonary artery extending into multiple segmental and subsegmental sized branches throughout the basal segments. This appears to be nonocclusive, however, there is evidence of hemorrhage in the right lower lobe which does suggest frank pulmonary infarction. 2. Additional areas of ground-glass attenuation are noted throughout the lungs bilaterally, which could reflect some endobronchial spread of hemorrhage. This  is also interspersed with areas of lucency, which may suggest concurrent air trapping. 3. Mild cardiomegaly. Critical Value/emergent results were called by telephone at the time of interpretation on 03/12/2015 at 12:04 pm to Dr. Charlestine Night, who verbally acknowledged these results. Electronically Signed   By: Trudie Reed M.D.   On: 03/12/2015 12:06   Ct Abdomen Pelvis W Contrast  02/13/2015  CLINICAL DATA:  25 year old female with left lower quadrant pain following surgery for Crohn disease this year. Previous abscess requiring drainage. Subsequent encounter. EXAM: CT ABDOMEN AND PELVIS WITH CONTRAST TECHNIQUE: Multidetector CT imaging of the abdomen and pelvis was performed using the standard protocol following bolus administration of intravenous contrast. CONTRAST:  80 mL Omnipaque 300 COMPARISON:  Small bowel follow-through 12/14/2014. CT Abdomen and Pelvis 12/10/2014 and earlier. FINDINGS: Lower lung volumes. Streaky and patchy bilateral lower lobe pulmonary opacity slightly greater on the right. No associated consolidation or pleural effusion. No pericardial effusion. No acute osseous abnormality identified. No pelvic free fluid. Negative rectum. Urinary bladder mildly distended and within normal limits. Uterus and adnexal within normal limits for age ; physiologic right adnexal cyst suspected on series 3, image 68. Mild stranding along the superior aspect of the uterus and adnexa favored to be postoperative in nature. The distal sigmoid colon is decompressed with no definite inflammation. Proximal sigmoid colon extends to the level of the umbilicus and is redundant. It is mildly to moderately gas distended with no wall thickening. Negative left colon and splenic flexure. Negative transverse colon. Retained stool in the right colon. Postoperative changes about the cecum with neo terminal ileum which appears capacious. This is a side-to-side type anastomosis and is new since October. Oral contrast  has just reached the cecum. No dilated or definitely inflamed small bowel loops. Negative stomach and duodenum. No pneumoperitoneum. Liver, gallbladder, spleen, pancreas and adrenal glands are within normal limits. Portal venous system is patent. Major arterial structures in the abdomen and pelvis are patent. Bilateral renal enhancement within normal limits. Postoperative changes to the ventral abdominal wall with no postoperative fluid collection identified. No lymphadenopathy identified. IMPRESSION: 1. Right lower quadrant bowel surgery since October with neo terminal ileum and side-to-side type anastomosis. Postoperative stranding with no definite inflamed bowel loops. No obstruction. No free fluid or fluid collection in the abdomen or pelvis. 2. Nonspecific streaky bilateral lower lobe opacity. Favor atelectasis but developing infection difficult to exclude. Electronically Signed   By: Odessa Fleming M.D.   On: 02/13/2015 14:45    2D Echo: Study Conclusions  - Left ventricle: The cavity size was normal. Wall thickness was normal. Systolic function was normal. The estimated ejection fraction was in the  range of 60% to 65%. Wall motion was normal; there were no regional wall motion abnormalities. Left ventricular diastolic function parameters were normal. - Left atrium: The atrium was normal in size. - Tricuspid valve: There was trivial regurgitation. - Pulmonary arteries: PA peak pressure: 20 mm Hg (S). - Inferior vena cava: The vessel was normal in size. The respirophasic diameter changes were in the normal range (>= 50%), consistent with normal central venous pressure.  Impressions:  - Normal study.  Admission HPI: Ms. 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 The pain started the night of 12/30, which she described as a sharp pain starting on her right chest and radiating under her shoulder to her back. Over the next 24 hours, the pain intensified and she found she could not sleep at all and could only lie on her left side. She denies shortness of breath, but says her breathing has been shallow because she endorses chest pain with deep inspiration. She was supposed to be on long-term anticoagulation, but she has not taken any of her Eliquis in the past 1.5 weeks, because she's "tired of taking pills," including her Keppra. She denied any adverse effects from these medications. Of note, she said she felt a cramp in her left leg on the toilet several days ago which she had to "rub out," but she denied any calf swelling or tenderness. Associated symptoms have included a fever (101F) last night, although she denies chills or night sweats. She also endorses a migraine that resolved 48 hours ago, generalized weakness, and memory problems. She denied any light-headedness, nausea, vomiting, diarrhea, or dysuria. She denies any recent travel or prolonged immobilization. She denies any family history of autoimmune or clotting disorders. She continues to smoke 1/2 ppd, unsuccessfully trying patches in the past, but she is interested in quitting. She does not drink or use illicit substances. Previous hypercoagulable workup has included anticardiolipin, lupus anticoagulant, beta2 glycoprotein, antithrombin, prothrombin, Factor V leiden, Protein C&S, all which have been negative. She was scheduled an appointment with a hematologist, but she did not make it to her appointment.  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 She underwent a laparoscopic ileocecectomy  on 10/17 and was discharged on 11/3. In the ED, she was satting 99% on room air. A CTA demonstrated an acute intermediate, at least submassive PE with right heart strain (RV/LV 0.97), possible endobronchial hemorrhage.    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 Prior the current hospitalization, the patient had not been taking her Eliquis despite her known hypercoagulable state. On day of admission, Interventional Radiology indicated that the typical cutoff for interventions is RV/LV ratio >1 with troponin leak and recommended TTE, her troponin was negative.Previous workup for her hypercoagulable state has been unremarkable, but it regardless of the cause, it was surmised she would have be on lifelong anticoagulation regardless.  She was noted to have had a possible history of Crohn's, which may serve as the basis of her hypercoagulable state. It was managed initially with IV heparin and transitioned to Xarelto 15 mg BID on 03/13/15. Her chest pain was managed with IV toradol 15 mg IV q6h prn. Oxycodone (in addition to her methadone 85 mg daily for chronic pain) was given intermittently for breakthrough, but eventually held due to hypotension. Her SpO2's did not go below 90% at rest, but decreased to 79% while ambulating on 1/2; however on 1/3, she had ambulated twice throughout the day without ambulating, and her chest pain had abated significantly.  Iron deficiency Anemia: Patient was found to be anemic to the 8's with a ferritin of 5 and MCV in the 70s. This could have been related to her menses on anticoagulation, possible Crohn's, or recent GI surgery. She was given an IV dose of feraheme and started on ferrous sulfate 325 mg TID. She  denied any dark stools, and FOBT on 12/5 was negative.  Menorrhagia: Patient reported some vaginal bleeding on 1/2 which became only clots by 1/3. She indicated that she has had an irregular period since the birth of her child 10 months ago. She endorses some abdominal cramping. She was encouraged follow-up with OB/Gyn.  Seizure Disorder: Patient had not taken her Keppra in 1.5 weeks prior to admission. This was prescribed for one witnessed seizure during a 06/2014 admission for a sinus venous thrombosis. She has not had any recent seizures, and did not have any during this admission. She was given a loading dose of Keppra and transitioned to 500 mg BID.   Chronic Pain Syndrome: She was continued on Methadone 85 mg daily. This was apparently given for interstitial cystitis, which has apparently resolved. She was given intermittent oxycodone 5 mg IR po for breakthrough chest pain, but this was causing hypotension while she was NPO and was stopped. Her hypotension improved after stopping oxycodone IR.   Possible Crohn's disease: This diagnosis is unclear based on my review of outside and internal records. If confirmed, could explain her hypercoagulable state. We encouraged her to follow-up with [facility] for further workup and management.  - Encouraged follow-up with [facility]  Discharge Vitals:   BP 113/59 mmHg  Pulse 68  Temp(Src) 99.3 F (37.4 C) (Oral)  Resp 18  Ht 5\' 2"  (1.575 m)  Wt 195 lb 12.3 oz (88.8 kg)  BMI 35.80 kg/m2  SpO2 98%  LMP 02/22/2015  Breastfeeding? No  Discharge Labs:  Results for orders placed or performed during the hospital encounter of 03/12/15 (from the past 24 hour(s))  CBC     Status: Abnormal   Collection Time: 03/14/15  2:30 AM  Result Value Ref Range   WBC 3.4 (L) 4.0 - 10.5 K/uL   RBC 3.99 3.87 - 5.11 MIL/uL   Hemoglobin 8.6 (L) 12.0 - 15.0 g/dL   HCT 16.1 (L) 09.6 - 04.5 %   MCV 73.4 (L) 78.0 - 100.0 fL   MCH 21.6 (L) 26.0 -  34.0 pg   MCHC 29.4 (L) 30.0 - 36.0 g/dL   RDW 40.9 (H) 81.1 - 91.4 %   Platelets 210 150 - 400 K/uL  Basic metabolic panel     Status: Abnormal   Collection Time: 03/14/15  2:30 AM  Result Value Ref Range   Sodium 142 135 - 145 mmol/L   Potassium 3.5 3.5 - 5.1 mmol/L   Chloride 114 (H) 101 - 111  mmol/L   CO2 19 (L) 22 - 32 mmol/L   Glucose, Bld 113 (H) 65 - 99 mg/dL   BUN 6 6 - 20 mg/dL   Creatinine, Ser 1.61 0.44 - 1.00 mg/dL   Calcium 8.4 (L) 8.9 - 10.3 mg/dL   GFR calc non Af Amer >60 >60 mL/min   GFR calc Af Amer >60 >60 mL/min   Anion gap 9 5 - 15  MRSA PCR Screening     Status: None   Collection Time: 03/14/15  5:28 AM  Result Value Ref Range   MRSA by PCR NEGATIVE NEGATIVE    Signed: Ruben Im, MD 03/14/2015, 3:01 PM

## 2015-03-14 NOTE — Progress Notes (Signed)
Pt ambulated ~ 514ft again per MD request. Pt's O2 sats stayed above 98% on RA. Pt without complaints. Currently resting in bed after walk.

## 2015-03-14 NOTE — Progress Notes (Signed)
Subjective: [name] desatted to 79% yesterday while ambulating and was hypotensive to 79/40, she was transferred to stepdown unit. This morning, she said she feels much better and her appetite has returned, which she was eating when I walked in. Her chest pain is mostly resolved. She walked around this morning without becoming short of breath or light-headed. She complained of some menorrhagia this morning.  Objective: Vital signs in last 24 hours: Filed Vitals:   03/14/15 0535 03/14/15 0539 03/14/15 0749 03/14/15 0859  BP:  98/52  101/43  Pulse:    71  Temp: 97.5 F (36.4 C)   100 F (37.8 C)  TempSrc:    Oral  Resp: 16   18  Height:      Weight:   195 lb 12.3 oz (88.8 kg)   SpO2:    99%   Weight change:   Intake/Output Summary (Last 24 hours) at 03/14/15 0942 Last data filed at 03/14/15 0900  Gross per 24 hour  Intake   4360 ml  Output      0 ml  Net   4360 ml   Physical Exam General: Lying in bed, NAD HEENT: Increased neck circumference. No scleral icterus Cardiovascular: RRR, no m/r/g Pulmonary: Clear to ausculation bilaterally Abdominal: Obese. Soft, NT/ND. Normal bowel sounds Extremities: No calf swelling or tenderness. No clubbing, cyanosis, or edema.  Lab Results: Basic Metabolic Panel:  Recent Labs Lab 03/13/15 0130 03/14/15 0230  NA 138 142  K 3.5 3.5  CL 107 114*  CO2 21* 19*  GLUCOSE 108* 113*  BUN 11 6  CREATININE 0.74 0.71  CALCIUM 9.4 8.4*   CBC:  Recent Labs Lab 03/12/15 0920  03/13/15 0130 03/14/15 0230  WBC 5.8  --  5.4 3.4*  NEUTROABS 3.9  --   --   --   HGB 9.2*  < > 8.7* 8.6*  HCT 30.4*  < > 28.7* 29.3*  MCV 72.7*  --  71.9* 73.4*  PLT 193  --  199 210  < > = values in this interval not displayed. Cardiac Enzymes:  Recent Labs Lab 03/12/15 1606  TROPONINI <0.03   Coagulation:  Recent Labs Lab 03/12/15 0920  LABPROT 13.6  INR 1.02   Anemia Panel:  Recent Labs Lab 03/12/15 1606  FERRITIN 5*  RETICCTPCT 1.1      Micro Results: Recent Results (from the past 240 hour(s))  MRSA PCR Screening     Status: None   Collection Time: 03/14/15  5:28 AM  Result Value Ref Range Status   MRSA by PCR NEGATIVE NEGATIVE Final    Comment:        The GeneXpert MRSA Assay (FDA approved for NASAL specimens only), is one component of a comprehensive MRSA colonization surveillance program. It is not intended to diagnose MRSA infection nor to guide or monitor treatment for MRSA infections.    Studies/Results: Ct Angio Chest Pe W/cm &/or Wo Cm  03/12/2015  CLINICAL DATA:  25 year old female with right-sided chest and back pain since yesterday. Shortness of breath. Difficulty taking deep inspirations. History of blood clots. EXAM: CT ANGIOGRAPHY CHEST WITH CONTRAST TECHNIQUE: Multidetector CT imaging of the chest was performed using the standard protocol during bolus administration of intravenous contrast. Multiplanar CT image reconstructions and MIPs were obtained to evaluate the vascular anatomy. CONTRAST:  33mL OMNIPAQUE IOHEXOL 350 MG/ML SOLN COMPARISON:  No priors. FINDINGS: Mediastinum/Lymph Nodes: In the right lower lobe pulmonary artery extending into multiple segmental and subsegmental sized branches  to the basal segments, there is a large filling defect which appears nonocclusive. Heart size is mildly enlarged. Right ventricular diameter of 36 mm. Left ventricular diameter of 37 mm. RV to LV ratio of 0.97. There is no significant pericardial fluid, thickening or pericardial calcification. No pathologically enlarged mediastinal or hilar lymph nodes. Esophagus is unremarkable in appearance. No axillary lymphadenopathy. Lungs/Pleura: Airspace consolidation in the right lower lobe basal segments, likely to reflect hemorrhage from acute pulmonary infarction. Some patchy areas of ground-glass attenuation are noted throughout the remainder of the lungs bilaterally, interspersed with areas of lucency. Scattered linear  opacities throughout the lungs bilaterally, may reflect areas of subsegmental atelectasis or scarring. No pleural effusions. Upper Abdomen: Unremarkable. Musculoskeletal/Soft Tissues: There are no aggressive appearing lytic or blastic lesions noted in the visualized portions of the skeleton. Review of the MIP images confirms the above findings. IMPRESSION: 1. Positive for acute PE with CT evidence of right heart strain (RV/LV Ratio = 0.97) consistent with at least submassive (intermediate risk) PE. The presence of right heart strain has been associated with an increased risk of morbidity and mortality. Please activate Code PE by paging [phone]. Specifically, there is a large burden of clot involving the distal right lower lobe pulmonary artery extending into multiple segmental and subsegmental sized branches throughout the basal segments. This appears to be nonocclusive, however, there is evidence of hemorrhage in the right lower lobe which does suggest frank pulmonary infarction. 2. Additional areas of ground-glass attenuation are noted throughout the lungs bilaterally, which could reflect some endobronchial spread of hemorrhage. This is also interspersed with areas of lucency, which may suggest concurrent air trapping. 3. Mild cardiomegaly. Critical Value/emergent results were called by telephone at the time of interpretation on 03/12/2015 at 12:04 pm to Dr. Charlestine Night, who verbally acknowledged these results. Electronically Signed   By: Trudie Reed M.D.   On: 03/12/2015 12:06   Medications: I have reviewed the patient's current medications. Scheduled Meds: . ferrous sulfate  325 mg Oral TID WC  . levETIRAcetam  500 mg Oral BID  . methadone  85 mg Oral Daily  . polyethylene glycol  17 g Oral Daily  . Rivaroxaban  15 mg Oral BID WC  . [START ON 04/03/2015] rivaroxaban  20 mg Oral Q supper  . 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 Patient has not been taking her Eliquis despite known hypercoagulable state. We have spoken with Interventional Radiology, who indicated that the typical cutoff for interventions is RV/LV ratio >1 with troponin leak. TTE was normal. Her troponin was negative.  Previous workup for hypercoag state has been unremarkable, although she may have possible Crohn's disease, although a discharge summary from 11/3 from [facility] for an ileal stricture reports that biopsy was negative for Crohn's colitis. Prior notes said this represented IBD. - Xarelto 15 mg BID for 3 weeks, followed by Xarelto 20 mg daily - Toradol 15 mg IV q6h prn - NPO in case intervention needed based on TTE - Walk today with pulse ox  Menorrhagia: Patient has had an irregular period since the birth of her child 10 months ago. She endorses some abdominal cramping. Likely  menstrual period in setting of anti-coagulation - Encouraged follow-up with OB/Gyn  Iron deficiency Anemia: Ferritin 5. MCV 70s. Likely due to ileal surgery at [facility] - Gave ferraheme yesterday IV - Ferrous sulfate 325 mg TID  Seizure Disorder: Patient had one witnessed seizure during a 06/2014 admission for a sinus venous thrombosis. No recent seizures, but no Keppra for 1.5 weeks - Keppra 500 mg BID  Chronic Pain Syndrome: Methadone 85 mg daily. She continuing to have breakthrough chest pain. - Continue methadone dose  Possible Crohn's disease: Unclear diagnosis based on review of outside and internal records. If confirmed, could explain her hypercoagulable state.  -  Encouraged follow-up with [facility]  DVT Prophylaxis: On Xarelto  Dispo: Anticipated discharge today if she does not desaturate while walking.  The patient does have a current PCP (Dessa Phi, MD) and does not need an [facility] hospital follow-up appointment after discharge.  The patient does not have transportation limitations that hinder transportation to clinic appointments.  .Services Needed at time of discharge: Y = Yes, Blank = No PT:   OT:   RN:   Equipment:   Other:     LOS: 2 days   Ruben Im, MD 03/14/2015, 9:42 AM

## 2015-03-14 NOTE — Progress Notes (Signed)
03/14/2015 5:15 AM Pt is to be moved to [address] [place].  Report given to receiving nurse.  Pt being sent up in bed with belongings. Harriet Masson, RN

## 2015-03-14 NOTE — Progress Notes (Signed)
03/14/2015 4:25 AM After 3rd liter bolus, pt's BP was 79/40 manually.  Pt has no fever and is maintaining a normal heart rhythm at a rate of 76.  Only complains of feeling weak and some dizziness.  Dr. Kyung Rudd was informed and stated that they will touch base with critical care before making a decision.  Will continue to monitor pt. Harriet Masson, RN

## 2015-03-14 NOTE — Progress Notes (Signed)
03/14/2015 1:14 AM After giving the liter bolus of normal saline the patient's BP was 86/50.  Dr. Kyung Rudd was notified and ordered another liter bolus and to get a manual blood pressure afterwards.  Also, suggests for patient to continue drinking as much fluids as possible.  Will continue to monitor pt. Harriet Masson, RN

## 2015-03-14 NOTE — Progress Notes (Signed)
Pt ambulated ~595ft without difficulty.  Pt's O2 sats never dropped below 98% on RA. Pt resting in bed comfortably.

## 2015-03-16 ENCOUNTER — Inpatient Hospital Stay: Payer: Medicaid Other | Admitting: Family Medicine

## 2015-03-22 NOTE — Progress Notes (Signed)
Printed Rx: Tramadol Not pick up by patient dated for July 08, 2014, put in the shredder.

## 2015-05-02 ENCOUNTER — Telehealth: Payer: Self-pay | Admitting: Family Medicine

## 2015-05-02 DIAGNOSIS — F1120 Opioid dependence, uncomplicated: Secondary | ICD-10-CM

## 2015-05-02 NOTE — Telephone Encounter (Signed)
Pt. Called requesting to be referred out to pain management. Pt. Would like to be referred out to Dr. Mirna Mires. Please f/u with pt.

## 2015-05-04 NOTE — Telephone Encounter (Signed)
Pain management referral placed.

## 2015-05-22 ENCOUNTER — Emergency Department (HOSPITAL_COMMUNITY)
Admission: EM | Admit: 2015-05-22 | Discharge: 2015-05-22 | Disposition: A | Discharge status: Home / Self Care | Payer: Medicaid Other | Attending: Emergency Medicine | Admitting: Emergency Medicine

## 2015-05-22 ENCOUNTER — Encounter (HOSPITAL_COMMUNITY): Payer: Self-pay | Admitting: Emergency Medicine

## 2015-05-22 DIAGNOSIS — F17210 Nicotine dependence, cigarettes, uncomplicated: Secondary | ICD-10-CM | POA: Insufficient documentation

## 2015-05-22 DIAGNOSIS — R109 Unspecified abdominal pain: Secondary | ICD-10-CM | POA: Diagnosis present

## 2015-05-22 DIAGNOSIS — R1110 Vomiting, unspecified: Secondary | ICD-10-CM | POA: Diagnosis not present

## 2015-05-22 DIAGNOSIS — Z9119 Patient's noncompliance with other medical treatment and regimen: Secondary | ICD-10-CM | POA: Diagnosis not present

## 2015-05-22 DIAGNOSIS — J45909 Unspecified asthma, uncomplicated: Secondary | ICD-10-CM | POA: Diagnosis not present

## 2015-05-22 LAB — COMPREHENSIVE METABOLIC PANEL
ALT: 37 U/L (ref 14–54)
AST: 25 U/L (ref 15–41)
Albumin: 4 g/dL (ref 3.5–5.0)
Alkaline Phosphatase: 63 U/L (ref 38–126)
Anion gap: 11 (ref 5–15)
BUN: 13 mg/dL (ref 6–20)
CO2: 19 mmol/L — ABNORMAL LOW (ref 22–32)
Calcium: 10.3 mg/dL (ref 8.9–10.3)
Chloride: 108 mmol/L (ref 101–111)
Creatinine, Ser: 0.73 mg/dL (ref 0.44–1.00)
GFR calc Af Amer: >60 mL/min (ref >60)
GFR calc non Af Amer: >60 mL/min (ref >60)
Glucose, Bld: 101 mg/dL — ABNORMAL HIGH (ref 65–99)
Potassium: 3.9 mmol/L (ref 3.5–5.1)
Sodium: 138 mmol/L (ref 135–145)
Total Bilirubin: 1.3 mg/dL — ABNORMAL HIGH (ref 0.3–1.2)
Total Protein: 6.6 g/dL (ref 6.5–8.1)

## 2015-05-22 LAB — I-STAT BETA HCG BLOOD, ED (MC, WL, AP ONLY): I-stat hCG, quantitative: <5 m[IU]/mL (ref <5)

## 2015-05-22 LAB — CBC
HCT: 41.1 % (ref 36.0–46.0)
Hemoglobin: 14.1 g/dL (ref 12.0–15.0)
MCH: 26.8 pg (ref 26.0–34.0)
MCHC: 34.3 g/dL (ref 30.0–36.0)
MCV: 78 fL (ref 78.0–100.0)
Platelets: 226 10*3/uL (ref 150–400)
RBC: 5.27 MIL/uL — ABNORMAL HIGH (ref 3.87–5.11)
RDW: 21.4 % — ABNORMAL HIGH (ref 11.5–15.5)
WBC: 5.4 10*3/uL (ref 4.0–10.5)

## 2015-05-22 LAB — LIPASE, BLOOD: Lipase: 26 U/L (ref 11–51)

## 2015-05-22 NOTE — ED Notes (Signed)
Pt c/o abdominal pain with vomiting blood onset this morning. Pt has history of Chron's Disease.

## 2015-06-21 ENCOUNTER — Emergency Department (HOSPITAL_COMMUNITY)
Admission: EM | Admit: 2015-06-21 | Discharge: 2015-06-21 | Disposition: A | Discharge status: Home / Self Care | Payer: Medicaid Other | Attending: Emergency Medicine | Admitting: Emergency Medicine

## 2015-06-21 ENCOUNTER — Emergency Department (HOSPITAL_COMMUNITY): Payer: Medicaid Other

## 2015-06-21 ENCOUNTER — Encounter (HOSPITAL_COMMUNITY): Payer: Self-pay | Admitting: Emergency Medicine

## 2015-06-21 DIAGNOSIS — Z8673 Personal history of transient ischemic attack (TIA), and cerebral infarction without residual deficits: Secondary | ICD-10-CM | POA: Diagnosis not present

## 2015-06-21 DIAGNOSIS — Z79891 Long term (current) use of opiate analgesic: Secondary | ICD-10-CM | POA: Diagnosis not present

## 2015-06-21 DIAGNOSIS — F17210 Nicotine dependence, cigarettes, uncomplicated: Secondary | ICD-10-CM | POA: Insufficient documentation

## 2015-06-21 DIAGNOSIS — Z8742 Personal history of other diseases of the female genital tract: Secondary | ICD-10-CM | POA: Insufficient documentation

## 2015-06-21 DIAGNOSIS — D649 Anemia, unspecified: Secondary | ICD-10-CM | POA: Diagnosis not present

## 2015-06-21 DIAGNOSIS — Z8659 Personal history of other mental and behavioral disorders: Secondary | ICD-10-CM | POA: Insufficient documentation

## 2015-06-21 DIAGNOSIS — Z7901 Long term (current) use of anticoagulants: Secondary | ICD-10-CM | POA: Insufficient documentation

## 2015-06-21 DIAGNOSIS — J45909 Unspecified asthma, uncomplicated: Secondary | ICD-10-CM | POA: Insufficient documentation

## 2015-06-21 DIAGNOSIS — Z8701 Personal history of pneumonia (recurrent): Secondary | ICD-10-CM | POA: Insufficient documentation

## 2015-06-21 DIAGNOSIS — G8929 Other chronic pain: Secondary | ICD-10-CM | POA: Insufficient documentation

## 2015-06-21 DIAGNOSIS — Z79899 Other long term (current) drug therapy: Secondary | ICD-10-CM | POA: Diagnosis not present

## 2015-06-21 DIAGNOSIS — Z3202 Encounter for pregnancy test, result negative: Secondary | ICD-10-CM | POA: Insufficient documentation

## 2015-06-21 DIAGNOSIS — G44209 Tension-type headache, unspecified, not intractable: Secondary | ICD-10-CM | POA: Diagnosis not present

## 2015-06-21 DIAGNOSIS — Z87448 Personal history of other diseases of urinary system: Secondary | ICD-10-CM | POA: Insufficient documentation

## 2015-06-21 DIAGNOSIS — R1084 Generalized abdominal pain: Secondary | ICD-10-CM | POA: Diagnosis not present

## 2015-06-21 DIAGNOSIS — G40909 Epilepsy, unspecified, not intractable, without status epilepticus: Secondary | ICD-10-CM | POA: Diagnosis not present

## 2015-06-21 DIAGNOSIS — R109 Unspecified abdominal pain: Secondary | ICD-10-CM | POA: Diagnosis present

## 2015-06-21 LAB — CBC
HCT: 41.4 % (ref 36.0–46.0)
Hemoglobin: 14 g/dL (ref 12.0–15.0)
MCH: 27.4 pg (ref 26.0–34.0)
MCHC: 33.8 g/dL (ref 30.0–36.0)
MCV: 81 fL (ref 78.0–100.0)
Platelets: 168 10*3/uL (ref 150–400)
RBC: 5.11 MIL/uL (ref 3.87–5.11)
RDW: 14.8 % (ref 11.5–15.5)
WBC: 3.5 10*3/uL — ABNORMAL LOW (ref 4.0–10.5)

## 2015-06-21 LAB — COMPREHENSIVE METABOLIC PANEL
ALT: 20 U/L (ref 14–54)
AST: 19 U/L (ref 15–41)
Albumin: 4 g/dL (ref 3.5–5.0)
Alkaline Phosphatase: 50 U/L (ref 38–126)
Anion gap: 9 (ref 5–15)
BUN: 11 mg/dL (ref 6–20)
CO2: 20 mmol/L — ABNORMAL LOW (ref 22–32)
Calcium: 10 mg/dL (ref 8.9–10.3)
Chloride: 109 mmol/L (ref 101–111)
Creatinine, Ser: 0.77 mg/dL (ref 0.44–1.00)
GFR calc Af Amer: >60 mL/min (ref >60)
GFR calc non Af Amer: >60 mL/min (ref >60)
Glucose, Bld: 105 mg/dL — ABNORMAL HIGH (ref 65–99)
Potassium: 4.3 mmol/L (ref 3.5–5.1)
Sodium: 138 mmol/L (ref 135–145)
Total Bilirubin: 1.9 mg/dL — ABNORMAL HIGH (ref 0.3–1.2)
Total Protein: 6.5 g/dL (ref 6.5–8.1)

## 2015-06-21 LAB — LIPASE, BLOOD: Lipase: 24 U/L (ref 11–51)

## 2015-06-21 LAB — URINALYSIS, ROUTINE W REFLEX MICROSCOPIC
Bilirubin Urine: NEGATIVE
Glucose, UA: NEGATIVE mg/dL
Hgb urine dipstick: NEGATIVE
Ketones, ur: NEGATIVE mg/dL
Leukocytes, UA: NEGATIVE
Nitrite: NEGATIVE
Protein, ur: NEGATIVE mg/dL
Specific Gravity, Urine: 1.011 (ref 1.005–1.030)
pH: 5.5 (ref 5.0–8.0)

## 2015-06-21 LAB — POC URINE PREG, ED: Preg Test, Ur: NEGATIVE

## 2015-06-21 MED ORDER — KETOROLAC TROMETHAMINE 30 MG/ML IJ SOLN: 30.0000 mg | Freq: Once | INTRAMUSCULAR | Status: DC

## 2015-06-21 MED ORDER — IOPAMIDOL (ISOVUE-300) INJECTION 61%
INTRAVENOUS | Status: AC
  Administered 2015-06-21: 100 mL
  Filled 2015-06-21: qty 100

## 2015-06-21 MED ORDER — SODIUM CHLORIDE 0.9 % IV BOLUS (SEPSIS)
1000.0000 mL | Freq: Once | INTRAVENOUS | Status: AC
  Administered 2015-06-21: 1000 mL via INTRAVENOUS

## 2015-06-21 MED ORDER — KETOROLAC TROMETHAMINE 30 MG/ML IJ SOLN
15.0000 mg | Freq: Once | INTRAMUSCULAR | Status: DC
  Administered 2015-06-21: 15 mg via INTRAVENOUS
  Filled 2015-06-21: qty 1

## 2015-06-21 MED ORDER — MORPHINE SULFATE (PF) 4 MG/ML IV SOLN
4.0000 mg | Freq: Once | INTRAVENOUS | Status: AC
  Administered 2015-06-21: 4 mg via INTRAVENOUS
  Filled 2015-06-21: qty 1

## 2015-06-21 MED ORDER — KETOROLAC TROMETHAMINE 30 MG/ML IJ SOLN: 15.0000 mg | Freq: Once | INTRAMUSCULAR | Status: DC

## 2015-06-21 MED ORDER — PROCHLORPERAZINE EDISYLATE 5 MG/ML IJ SOLN
10.0000 mg | Freq: Once | INTRAMUSCULAR | Status: DC
Start: 2015-06-21 — End: 2015-06-21

## 2015-06-21 MED ORDER — DIPHENHYDRAMINE HCL 50 MG/ML IJ SOLN: 12.5000 mg | Freq: Once | INTRAMUSCULAR | Status: DC

## 2015-06-21 MED ORDER — DIPHENHYDRAMINE HCL 50 MG/ML IJ SOLN
12.5000 mg | Freq: Once | INTRAMUSCULAR | Status: DC
  Administered 2015-06-21: 12.5 mg via INTRAVENOUS
  Filled 2015-06-21: qty 1

## 2015-06-21 MED ORDER — PROCHLORPERAZINE EDISYLATE 5 MG/ML IJ SOLN
10.0000 mg | Freq: Once | INTRAMUSCULAR | Status: DC
  Administered 2015-06-21: 10 mg via INTRAVENOUS
  Filled 2015-06-21: qty 2

## 2015-06-21 MED ORDER — SODIUM CHLORIDE 0.9 % IV BOLUS (SEPSIS): 1000.0000 mL | Freq: Once | INTRAVENOUS | Status: DC

## 2015-06-21 NOTE — ED Notes (Signed)
Patient with Crohn's disease complains of abdominal pain two days ago.  Patient is prescribed oxycodone for abdominal pain.  Patient states that this morning she woke up and her arm felt heavy.  Patient is tearful but alert, oriented, ambulatory with steady gait, in no apparent distress at this time.

## 2015-06-21 NOTE — ED Notes (Signed)
The patient expressed that she wanted to be discharged.  Provider aware.

## 2015-06-21 NOTE — Discharge Instructions (Signed)
Please read and follow all provided instructions.  Your diagnoses today include:  1. Generalized abdominal pain    Tests performed today include:  Blood counts and electrolytes  Blood tests to check liver and kidney function  Blood tests to check pancreas function  Urine test to look for infection and pregnancy (in women)  Vital signs. See below for your results today.   Medications prescribed:   Take any prescribed medications only as directed.  Home care instructions:   Follow any educational materials contained in this packet.  Follow-up instructions: Please follow-up with your GI doctor for further evaluation of your symptoms.    Return instructions:  SEEK IMMEDIATE MEDICAL ATTENTION IF:  The pain does not go away or becomes severe   A temperature above 101F develops   Repeated vomiting occurs (multiple episodes)   The pain becomes localized to portions of the abdomen. The right side could possibly be appendicitis. In an adult, the left lower portion of the abdomen could be colitis or diverticulitis.   Blood is being passed in stools or vomit (bright red or black tarry stools)   You develop chest pain, difficulty breathing, dizziness or fainting, or become confused, poorly responsive, or inconsolable (young children)  If you have any other emergent concerns regarding your health  Additional Information: Abdominal (belly) pain can be caused by many things. Your caregiver performed an examination and possibly ordered blood/urine tests and imaging (CT scan, x-rays, ultrasound). Many cases can be observed and treated at home after initial evaluation in the emergency department. Even though you are being discharged home, abdominal pain can be unpredictable. Therefore, you need a repeated exam if your pain does not resolve, returns, or worsens. Most patients with abdominal pain don't have to be admitted to the hospital or have surgery, but serious problems like  appendicitis and gallbladder attacks can start out as nonspecific pain. Many abdominal conditions cannot be diagnosed in one visit, so follow-up evaluations are very important.  Your vital signs today were: BP 112/69 mmHg   Pulse 79   Temp(Src) 98.5 F (36.9 C) (Oral)   Resp 14   SpO2 95%   LMP  (Within Weeks) If your blood pressure (bp) was elevated above 135/85 this visit, please have this repeated by your doctor within one month. --------------

## 2015-06-21 NOTE — ED Notes (Signed)
Patient transported to CT 

## 2015-06-21 NOTE — ED Provider Notes (Signed)
CSN: [id number]     Arrival date & time 06/21/15  1025 History   First MD Initiated Contact with Patient 06/21/15 1107     Chief Complaint  Patient presents with  . Abdominal Pain   (Consider location/radiation/quality/duration/timing/severity/associated sxs/prior Treatment) HPI 25 y.o. female with a hx of Crohns Disease, venous thrombosis, presents to the Emergency Department today complaining of abdominal pain and headache. 1) Headache started this morning. Pt states it was gradual with tension like symptoms around her head. States that it feels like a pressure and pain is 7/10. No fevers. No vision changes. No CP/SOB. Does note that she had similar symptoms x 1 year ago and ended up having a right sided stroke. States that her Crohns + pregnancy caused the clot. Patient is on Xarelto Daily for prophylaxis.  2) Pt states that her abdominal pain started x 2 days ago. Noted emesis this morning. No hematemesis. States pain is generalized and 7/10 with a sharp feeling. Has been constant with no relieving factors. No diarrhea. No fevers. No other symptoms noted. Pt does not take any medication for Crohns disease. Follows GI doctors at [facility] due to complicated medical history with CVA. Had surgery last year with resection of bowel due to Crohns.    Past Medical History  Diagnosis Date  . Interstitial cystitis     pain from this was reason for starting opioids age 58.   . Chronic pelvic pain in female   . Headache(784.0)   . Pyelonephritis affecting pregnancy in first trimester July 2015  . Depression 2007  . Opioid dependence (HCC)     to Rx opioids.  switched to Methadone after second child born in 04/2014  . Pneumonia 03/2014  . Seizures (HCC)   . Stroke [facility]) 05/2014    presented with right sided weakness.   . Numbness     right side  . 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 Preeclampsia 2012  . Miscarriage 2005  . Non-compliance     with anticoagulant  . Pneumatosis of intestines  10/2014  . Cerebral venous sinus thrombosis   . Morbid obesity (HCC)     BMI 37 in 11/2014.  [name] Anemia 07/2014    Microcytic  . Hypercoagulable state, primary (HCC) 07/2014    . no clear inherited or acquired cause for the patient's coagulopathy  . Asthma    Past Surgical History  Procedure Laterality Date  . Vagina surgery  ~ 2011    benign vaginal tumor removed at [facility].   . Cesarean section N/A 05/06/2014    Procedure: CESAREAN SECTION;  Surgeon: Levie Heritage, DO;  Location: [facility];  Service: Obstetrics;  Laterality: N/A;  . Inguinal hernia repair Left 2008  . Colonoscopy with propofol N/A 11/18/2014    Procedure: COLONOSCOPY WITH PROPOFOL;  Surgeon: Rachael Fee, MD;  Location: [facility];  Service: Endoscopy;  Laterality: N/A;  . Abdominal surgery     Family History  Problem Relation Age of Onset  . Diabetes Mother   . Heart disease Mother   . Hypertension Father   . Diabetes Maternal Grandmother   . Hypertension Maternal Grandmother   . Stroke Maternal Grandmother   . Diabetes Maternal Grandfather   . Hypertension Maternal Grandfather   . Diabetes Paternal Grandmother   . Diabetes Paternal Grandfather    Social History  Substance Use Topics  . Smoking status: Current Every Day Smoker -- 0.25 packs/day for 8 years    Types: Cigarettes  .  Smokeless tobacco: Never Used     Comment: cutting back  . Alcohol Use: No   OB History    Gravida Para Term Preterm AB TAB SAB Ectopic Multiple Living   3 2 2  0 1 0 1 0 0 2     Review of Systems ROS reviewed and all are negative for acute change except as noted in the HPI.  Allergies  Bee venom  Home Medications   Prior to Admission medications   Medication Sig Start Date End Date Taking? Authorizing Provider  ferrous sulfate 325 (65 FE) MG tablet Take 1 tablet (325 mg total) by mouth 3 (three) times daily with meals. 03/14/15   Ruben Im, MD  levETIRAcetam (KEPPRA) 500 MG tablet Take 1 tablet (500 mg total) by mouth  2 (two) times daily. 06/13/14   Richarda Overlie, MD  methadone (DOLOPHINE) 5 MG tablet Take 17 tablets (85 mg total) by mouth daily. Home medication continued Patient taking differently: Take 84 mg by mouth daily. Home medication continued. Patient states she takes 17 tablets every day per patient 10/16/14   Leroy Sea, MD  ondansetron (ZOFRAN) 4 MG tablet Take 1 tablet (4 mg total) by mouth every 6 (six) hours as needed for nausea. 03/14/15   Ruben Im, MD  polyethylene glycol [facility] Ethelene Hal) packet Take 17 g by mouth daily. 03/14/15   Ruben Im, MD  Rivaroxaban (XARELTO) 15 MG TABS tablet Take 1 tablet (15 mg total) by mouth 2 (two) times daily with a meal. 03/14/15   Ruben Im, MD  rivaroxaban (XARELTO) 20 MG TABS tablet Take 1 tablet (20 mg total) by mouth daily with supper. 04/03/15   Ruben Im, MD   BP 120/74 mmHg  Pulse 84  Temp(Src) 98.5 F (36.9 C) (Oral)  Resp 14  SpO2 97%  LMP  (Within Weeks)   Physical Exam  Constitutional: She is oriented to person, place, and time. She appears well-developed and well-nourished.  HENT:  Head: Normocephalic and atraumatic.  Eyes: EOM are normal. Pupils are equal, round, and reactive to light.  Neck: Normal range of motion. Neck supple. No tracheal deviation present.  Cardiovascular: Normal rate, regular rhythm, normal heart sounds and intact distal pulses.   No murmur heard. Pulmonary/Chest: Effort normal and breath sounds normal. No respiratory distress. She has no wheezes. She exhibits no tenderness.  Abdominal: Soft. Normal appearance and bowel sounds are normal. There is generalized tenderness. There is no rigidity, no rebound, no guarding, no CVA tenderness, no tenderness at McBurney's point and negative Murphy's sign.  Musculoskeletal: Normal range of motion.  Neurological: She is alert and oriented to person, place, and time. She has normal strength. No cranial nerve deficit or sensory deficit.  Negative Pronator Drift. Pt  ambulated. Pt able to lift both legs without difficulty  Cranial Nerves:  II: Pupils equal, round, reactive to light III,IV, VI: ptosis not present, extra-ocular motions intact bilaterally  V,VII: smile symmetric, facial light touch sensation equal VIII: hearing grossly normal bilaterally  IX,X: midline uvula rise  XI: bilateral shoulder shrug equal and strong XII: midline tongue extension  Skin: Skin is warm and dry.  Psychiatric: She has a normal mood and affect. Her behavior is normal. Thought content normal.  Nursing note and vitals reviewed.  ED Course  Procedures (including critical care time) Labs Review Labs Reviewed  CBC - Abnormal; Notable for the following:    WBC 3.5 (*)    All other components within normal limits  COMPREHENSIVE  METABOLIC PANEL - Abnormal; Notable for the following:    CO2 20 (*)    Glucose, Bld 105 (*)    Total Bilirubin 1.9 (*)    All other components within normal limits  LIPASE, BLOOD  URINALYSIS, ROUTINE W REFLEX MICROSCOPIC (NOT AT [facility])  POC URINE PREG, ED   Imaging Review Ct Head Wo Contrast  06/21/2015  CLINICAL DATA:  Right-sided headache and seeing black spots. History of intracranial venous thrombosis. EXAM: CT HEAD WITHOUT CONTRAST TECHNIQUE: Contiguous axial images were obtained from the base of the skull through the vertex without intravenous contrast. COMPARISON:  08/05/2014 FINDINGS: Skull and Sinuses:Negative for fracture or destructive process. The visualized mastoids, middle ears, and imaged paranasal sinuses are clear. Visualized orbits: Negative. Brain: No evidence of acute infarction, hemorrhage, hydrocephalus, or mass lesion/mass effect. No focal hyper density of the intracranial vessels IMPRESSION: Negative head CT. Electronically Signed   By: Marnee Spring M.D.   On: 06/21/2015 13:03   Ct Abdomen Pelvis W Contrast  06/21/2015  CLINICAL DATA:  Diffuse abdominal pain with nausea since yesterday. History of interstitial  cystitis, chronic pelvic pain and hernia repair. EXAM: CT ABDOMEN AND PELVIS WITH CONTRAST TECHNIQUE: Multidetector CT imaging of the abdomen and pelvis was performed using the standard protocol following bolus administration of intravenous contrast. CONTRAST:  ISOVUE-300 IOPAMIDOL (ISOVUE-300) INJECTION 61% COMPARISON:  CT 12/10/2014 and 02/13/2015 FINDINGS: Lower chest: 1.4 cm nodular density posteriorly in the right lower lobe on image number 14 is new from the prior studies, although has an appearance most consistent with postinflammatory scarring or atelectasis on the reformatted images. 6 mm left lower lobe subpleural nodule on image number 5 is unchanged, probably a lymph node. The lung bases are otherwise clear. There is no pleural or pericardial effusion. Hepatobiliary: The liver has a stable appearance without suspicious findings. Small low-density lesion in the dome of the liver on image number 7 is stable. No evidence of gallstones, gallbladder wall thickening or biliary dilatation. Pancreas: Unremarkable. No pancreatic ductal dilatation or surrounding inflammatory changes. Spleen: Normal in size without focal abnormality. Adrenals/Urinary Tract: Both adrenal glands appear normal. The kidneys appear normal without evidence of urinary tract calculus, suspicious lesion or hydronephrosis. No bladder abnormalities are seen. Stomach/Bowel: There are stable postsurgical changes status post resection of the terminal ileum and right colon with ileocolonic anastomosis. The anastomosis is patent. There is no recurrent wall thickening or surrounding inflammation in this area. Moderate stool is present throughout the colon. Vascular/Lymphatic: There are no enlarged abdominal or pelvic lymph nodes. No significant vascular findings are present. Reproductive: The uterus and left adnexa appear unremarkable. The right ovary is mildly prominent, measuring 2.9 x 2.4 cm on image 69. It contains a small collapsing  follicle. There is no suspicious adnexal finding or surrounding inflammation. Other: Periumbilical incisional hernia contains only fat, measuring up to 5.6 cm cephalocaudad on sagittal image 78. There is no herniated bowel. The anterior abdominal wall otherwise appears normal. There is no ascites. Musculoskeletal: No acute or significant osseous findings. The sacroiliac joints appear stable. No evidence of sacroiliitis. IMPRESSION: 1. No acute findings or explanation for the patient's symptoms. 2. Stable postsurgical changes status post partial resection of the right colon and terminal ileum. No recurrent inflammatory changes are seen around the anastomosis in this patient with a reported history of Crohn's disease. 3. Mild prominence the right ovary, likely physiologic. No surrounding inflammation. 4. Periumbilical incisional hernia containing only fat. 5. Probable postinflammatory scarring or atelectasis posteriorly  in the right lung base. Electronically Signed   By: Carey Bullocks M.D.   On: 06/21/2015 15:04   I have personally reviewed and evaluated these images and lab results as part of my medical decision-making.   EKG Interpretation None     MDM  I have reviewed and evaluated the relevant laboratory values. I have reviewed and evaluated the relevant imaging studies. I have reviewed the relevant previous healthcare records. I obtained HPI from historian. Patient discussed with supervising physician  ED Course:  Assessment: Pt is a 24yF with hx Crohns Disease, venous thrombosis, who presents with headache and abdominal pain. Hx venous thrombosis. ON Xarelto. On exam, pt in NAD. Nontoxic/nonseptic appearing. VSS. Afebrile. Lungs CTA. Heart RRR. Abdomen TTP generalized. No focal. CN evaluated and unremarkable. Motor/sensory intact. Neg pronator drift. Labs unremarkable. CT Head unremarkable. No acute abnormalities. CT ABD/Pelvis unremarkable. Given Fluids and Morphine in ED. Migraine cocktail in  ED with relief of symptoms. Plan is to DC home with follow up to GI at [facility] who has seen her before. At time of discharge, Patient is in no acute distress. Vital Signs are stable. Patient is able to ambulate. Patient able to tolerate PO.   Disposition/Plan:  DC Home Additional Verbal discharge instructions given and discussed with patient.  Pt Instructed to f/u with GI at [facility] for evaluation and treatment of symptoms. Return precautions given Pt acknowledges and agrees with plan  Supervising Physician Alvira Monday, MD   Final diagnoses:  Generalized abdominal pain  Tension-type headache, not intractable, unspecified chronicity pattern      Audry Pili, PA-C 06/21/15 1478  Alvira Monday, MD 06/22/15 1112

## 2015-09-03 ENCOUNTER — Emergency Department (HOSPITAL_COMMUNITY)
Admission: EM | Admit: 2015-09-03 | Discharge: 2015-09-04 | Disposition: A | Discharge status: Home / Self Care | Payer: Medicaid Other | Attending: Emergency Medicine | Admitting: Emergency Medicine

## 2015-09-03 ENCOUNTER — Other Ambulatory Visit: Payer: Self-pay

## 2015-09-03 ENCOUNTER — Encounter (HOSPITAL_COMMUNITY): Payer: Self-pay | Admitting: *Deleted

## 2015-09-03 DIAGNOSIS — N939 Abnormal uterine and vaginal bleeding, unspecified: Secondary | ICD-10-CM | POA: Insufficient documentation

## 2015-09-03 DIAGNOSIS — Z3202 Encounter for pregnancy test, result negative: Secondary | ICD-10-CM | POA: Insufficient documentation

## 2015-09-03 DIAGNOSIS — Z79899 Other long term (current) drug therapy: Secondary | ICD-10-CM | POA: Insufficient documentation

## 2015-09-03 DIAGNOSIS — R51 Headache: Secondary | ICD-10-CM | POA: Insufficient documentation

## 2015-09-03 DIAGNOSIS — J45909 Unspecified asthma, uncomplicated: Secondary | ICD-10-CM | POA: Insufficient documentation

## 2015-09-03 DIAGNOSIS — F17210 Nicotine dependence, cigarettes, uncomplicated: Secondary | ICD-10-CM | POA: Diagnosis not present

## 2015-09-03 DIAGNOSIS — Z8673 Personal history of transient ischemic attack (TIA), and cerebral infarction without residual deficits: Secondary | ICD-10-CM | POA: Diagnosis not present

## 2015-09-03 DIAGNOSIS — Z7901 Long term (current) use of anticoagulants: Secondary | ICD-10-CM | POA: Diagnosis not present

## 2015-09-03 DIAGNOSIS — R519 Headache, unspecified: Secondary | ICD-10-CM

## 2015-09-03 LAB — CBC WITH DIFFERENTIAL/PLATELET
Basophils Absolute: 0 10*3/uL (ref 0.0–0.1)
Basophils Relative: 0 %
Eosinophils Absolute: 0.1 10*3/uL (ref 0.0–0.7)
Eosinophils Relative: 2 %
HCT: 44 % (ref 36.0–46.0)
Hemoglobin: 15.3 g/dL — ABNORMAL HIGH (ref 12.0–15.0)
Lymphocytes Relative: 36 %
Lymphs Abs: 1.8 10*3/uL (ref 0.7–4.0)
MCH: 29.7 pg (ref 26.0–34.0)
MCHC: 34.8 g/dL (ref 30.0–36.0)
MCV: 85.3 fL (ref 78.0–100.0)
Monocytes Absolute: 0.2 10*3/uL (ref 0.1–1.0)
Monocytes Relative: 3 %
Neutro Abs: 3 10*3/uL (ref 1.7–7.7)
Neutrophils Relative %: 59 %
Platelets: 199 10*3/uL (ref 150–400)
RBC: 5.16 MIL/uL — ABNORMAL HIGH (ref 3.87–5.11)
RDW: 13.9 % (ref 11.5–15.5)
WBC: 5 10*3/uL (ref 4.0–10.5)

## 2015-09-03 LAB — SAMPLE TO BLOOD BANK

## 2015-09-03 LAB — BASIC METABOLIC PANEL
Anion gap: 8 (ref 5–15)
BUN: 7 mg/dL (ref 6–20)
CO2: 21 mmol/L — ABNORMAL LOW (ref 22–32)
Calcium: 9.4 mg/dL (ref 8.9–10.3)
Chloride: 108 mmol/L (ref 101–111)
Creatinine, Ser: 0.73 mg/dL (ref 0.44–1.00)
GFR calc Af Amer: >60 mL/min (ref >60)
GFR calc non Af Amer: >60 mL/min (ref >60)
Glucose, Bld: 113 mg/dL — ABNORMAL HIGH (ref 65–99)
Potassium: 3.3 mmol/L — ABNORMAL LOW (ref 3.5–5.1)
Sodium: 137 mmol/L (ref 135–145)

## 2015-09-03 LAB — HCG, QUANTITATIVE, PREGNANCY: hCG, Beta Chain, Quant, S: 1 m[IU]/mL (ref <5)

## 2015-09-03 LAB — PROTIME-INR
INR: 1.1 (ref 0.00–1.49)
Prothrombin Time: 14.4 seconds (ref 11.6–15.2)

## 2015-09-03 MED ORDER — METOCLOPRAMIDE HCL 5 MG/ML IJ SOLN
10.0000 mg | Freq: Once | INTRAMUSCULAR | Status: AC
  Administered 2015-09-04: 10 mg via INTRAVENOUS
  Filled 2015-09-03: qty 2

## 2015-09-03 MED ORDER — MORPHINE SULFATE (PF) 4 MG/ML IV SOLN
4.0000 mg | Freq: Once | INTRAVENOUS | Status: AC
  Administered 2015-09-04: 4 mg via INTRAVENOUS
  Filled 2015-09-03: qty 1

## 2015-09-03 MED ORDER — KETOROLAC TROMETHAMINE 30 MG/ML IJ SOLN
30.0000 mg | Freq: Once | INTRAMUSCULAR | Status: AC
Start: 2015-09-04 — End: 2015-09-04
  Administered 2015-09-04: 30 mg via INTRAVENOUS
  Filled 2015-09-03: qty 1

## 2015-09-03 MED ORDER — SODIUM CHLORIDE 0.9 % IV BOLUS (SEPSIS)
1000.0000 mL | Freq: Once | INTRAVENOUS | Status: AC
  Administered 2015-09-04: 1000 mL via INTRAVENOUS

## 2015-09-03 NOTE — ED Provider Notes (Signed)
CSN: [id number]     Arrival date & time 09/03/15  2024 History  By signing my name below, I, Arianna Nassar, attest that this documentation has been prepared under the direction and in the presence of Jola Schmidt, MD.  Electronically Signed: Julien Nordmann, ED Scribe. 09/03/2015. 11:05 PM.    Chief Complaint  Patient presents with  . Vaginal Bleeding      The history is provided by the patient. No language interpreter was used.   HPI Comments: Victoria Alvarez is a 25 y.o. female who has a PMHx of Crohns disease, stroke, cerebral venous sinus thrombosis presents to the Emergency Department complaining of intermittent, gradual worsening, moderate vaginal bleeding onset 4 days ago. She reports associated generalized weakness, light headedness, syncopal episodes, and acute onset headache. She notes that when her menstrual cycle came about 4 days ago it was heavier than normal caused her to stop taking her Xarelto for her blood clots. Pt has a hx of blood clots in her brain which caused her to have a stroke one year ago. Pt says that ambulating from her couch to the bathroom would make her feel very light-headed and cause her to pass out. She notes she does not normally have headaches but says she did have one that preceded her stroke. Pt says she has not had heavy menstrual cycles before. Pt says she took a goody powder to alleviate her pain with no relief. She denies blood in stool, melena, nausea, vomiting, or decreased appetite.  Past Medical History  Diagnosis Date  . Interstitial cystitis     pain from this was reason for starting opioids age 65.   . Chronic pelvic pain in female   . Headache(784.0)   . Pyelonephritis affecting pregnancy in first trimester July 2015  . Depression 2007  . Opioid dependence ([place])     to Rx opioids.  switched to Methadone after second child born in 04/2014  . Pneumonia 03/2014  . Seizures ([place])   . Stroke [facility]) 05/2014    presented with right sided weakness.    . Numbness     right side  . 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 Preeclampsia 2012  . Miscarriage 2005  . Non-compliance     with anticoagulant  . 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 Cerebral venous sinus thrombosis   . Morbid obesity ([place])     BMI 37 in 11/2014.  [name] Anemia 07/2014    Microcytic  . Hypercoagulable state, primary ([place]) 07/2014    . no clear inherited or acquired cause for the patient's coagulopathy  . Asthma    Past Surgical History  Procedure Laterality Date  . Vagina surgery  ~ 2011    benign vaginal tumor removed at [facility].   . Cesarean section N/A 05/06/2014    Procedure: CESAREAN SECTION;  Surgeon: Truett Mainland, DO;  Location: [place] [facility];  Service: Obstetrics;  Laterality: N/A;  . Inguinal hernia repair Left 2008  . Colonoscopy with propofol N/A 11/18/2014    Procedure: COLONOSCOPY WITH PROPOFOL;  Surgeon: Milus Banister, MD;  Location: [place];  Service: Endoscopy;  Laterality: N/A;  . Abdominal surgery     Family History  Problem Relation Age of Onset  . Diabetes Mother   . Heart disease Mother   . Hypertension Father   . Diabetes Maternal Grandmother   . Hypertension Maternal Grandmother   . Stroke Maternal Grandmother   . Diabetes Maternal Grandfather   . Hypertension Maternal Grandfather   .  Diabetes Paternal Grandmother   . Diabetes Paternal Grandfather    Social History  Substance Use Topics  . Smoking status: Current Every Day Smoker -- 0.25 packs/day for 8 years    Types: Cigarettes  . Smokeless tobacco: Never Used     Comment: cutting back  . Alcohol Use: No   OB History    Gravida Para Term Preterm AB TAB SAB Ectopic Multiple Living   3 2 2  0 1 0 1 0 0 2     Review of Systems  A complete 10 system review of systems was obtained and all systems are negative except as noted in the HPI and PMH.    Allergies  Bee venom  Home Medications   Prior to Admission medications   Medication Sig Start Date End Date Taking? Authorizing  Provider  ferrous sulfate 325 (65 FE) MG tablet Take 1 tablet (325 mg total) by mouth 3 (three) times daily with meals. 03/14/15   Liberty Handy, MD  levETIRAcetam (KEPPRA) 500 MG tablet Take 1 tablet (500 mg total) by mouth 2 (two) times daily. Patient not taking: Reported on 06/21/2015 06/13/14   Reyne Dumas, MD  methadone (DOLOPHINE) 5 MG tablet Take 17 tablets (85 mg total) by mouth daily. Home medication continued Patient not taking: Reported on 06/21/2015 10/16/14   Thurnell Lose, MD  ondansetron (ZOFRAN) 4 MG tablet Take 1 tablet (4 mg total) by mouth every 6 (six) hours as needed for nausea. Patient not taking: Reported on 06/21/2015 03/14/15   Liberty Handy, MD  Oxycodone HCl 10 MG TABS Take 10 mg by mouth every 6 (six) hours.    Historical Provider, MD  polyethylene glycol (MIRALAX / GLYCOLAX) packet Take 17 g by mouth daily. Patient not taking: Reported on 06/21/2015 03/14/15   Liberty Handy, MD  Rivaroxaban (XARELTO) 15 MG TABS tablet Take 1 tablet (15 mg total) by mouth 2 (two) times daily with a meal. Patient not taking: Reported on 06/21/2015 03/14/15   Liberty Handy, MD  rivaroxaban (XARELTO) 20 MG TABS tablet Take 1 tablet (20 mg total) by mouth daily with supper. 04/03/15   Liberty Handy, MD   BP 113/66 mmHg  Pulse 71  Temp(Src) 98.4 F (36.9 C) (Oral)  Resp 18  Ht 5' 2"  (1.575 m)  Wt 184 lb 1 oz (83.49 kg)  BMI 33.66 kg/m2  SpO2 99%  LMP 09/03/2015 Physical Exam  Constitutional: She is oriented to person, place, and time. She appears well-developed and well-nourished. No distress.  HENT:  Head: Normocephalic and atraumatic.  Eyes: EOM are normal.  Neck: Normal range of motion.  Cardiovascular: Normal rate, regular rhythm and normal heart sounds.   Pulmonary/Chest: Effort normal and breath sounds normal.  Abdominal: Soft. She exhibits no distension. There is no tenderness.  Musculoskeletal: Normal range of motion.  Neurological: She is alert and oriented to person, place, and time.   Skin: Skin is warm and dry.  Psychiatric: She has a normal mood and affect. Judgment normal.  Nursing note and vitals reviewed.   ED Course  Procedures  DIAGNOSTIC STUDIES: Oxygen Saturation is 99% on RA, normal by my interpretation.    Labs Review Labs Reviewed  CBC WITH DIFFERENTIAL/PLATELET - Abnormal; Notable for the following:    RBC 5.16 (*)    Hemoglobin 15.3 (*)    All other components within normal limits  BASIC METABOLIC PANEL - Abnormal; Notable for the following:    Potassium 3.3 (*)    CO2 21 (*)  Glucose, Bld 113 (*)    All other components within normal limits  HCG, QUANTITATIVE, PREGNANCY  PROTIME-INR  POC URINE PREG, ED  SAMPLE TO BLOOD BANK    Imaging Review No results found. I have personally reviewed and evaluated these images and lab results as part of my medical decision-making.   EKG Interpretation   Date/Time:  Sunday September 03 2015 20:58:25 EDT Ventricular Rate:  81 PR Interval:  158 QRS Duration: 76 QT Interval:  368 QTC Calculation: 427 R Axis:   74 Text Interpretation:  Normal sinus rhythm Normal ECG No significant change  was found Confirmed by Elene Downum  MD, Galen Malkowski ([id number]) on 09/03/2015 11:03:35 PM      MDM   Final diagnoses:  Vaginal bleeding  Headache, unspecified headache type    No longer with vaginal bleeding.  Pregnancy test is negative.  Outpatient GYN follow-up.  Abdominal exam is benign.  Patient also presenting with headache without focal weakness.  Resolved with migraine cocktail.   I personally performed the services described in this documentation, which was scribed in my presence. The recorded information has been reviewed and is accurate.      Jola Schmidt, MD 09/04/15 [phone]

## 2015-09-03 NOTE — ED Notes (Signed)
Weakness no energy with heavy vaginal bleeding for the past 3-4 days with weakness and she reports that she has fainted x 3 today   lmp last month  She stopped taking her zaralto  Hx of blood clots in the past.

## 2015-09-04 NOTE — ED Notes (Signed)
Patient verbalized understanding of discharge instructions and denies any further needs or questions at this time. VS stable. Patient ambulatory with steady gait.  

## 2015-09-18 IMAGING — CT CT CTA ABD/PEL W/CM AND/OR W/O CM
2 of 9 series · 9 of 46 positions shown, 15 images · IV contrast (Omni 300)
Comparison: CT abdomen pelvis - 11/14/2014

CLINICAL DATA: History of Crohn's disease. Evaluate for portal vein
thrombosis.

EXAM:
CT ANGIOGRAPHY ABDOMEN AND PELVIS WITH CONTRAST AND WITHOUT CONTRAST
TECHNIQUE: Multidetector CT imaging of the abdomen and pelvis was performed
using the standard protocol during bolus administration of
intravenous contrast. Multiplanar reconstructed images including
MIPs were obtained and reviewed to evaluate the vascular anatomy.
CONTRAST:  100mL OMNIPAQUE IOHEXOL 350 MG/ML SOLN

[Series 7: arterial 2.0 coronal · coronal · arterial · 0.78mm/px · 2 of 141 slices shown, 3 images]
[im 47/141  soft-tissue]
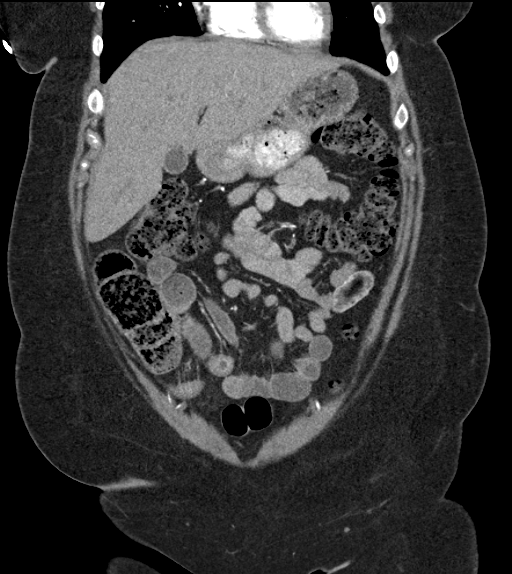
[im 47/141  bone]
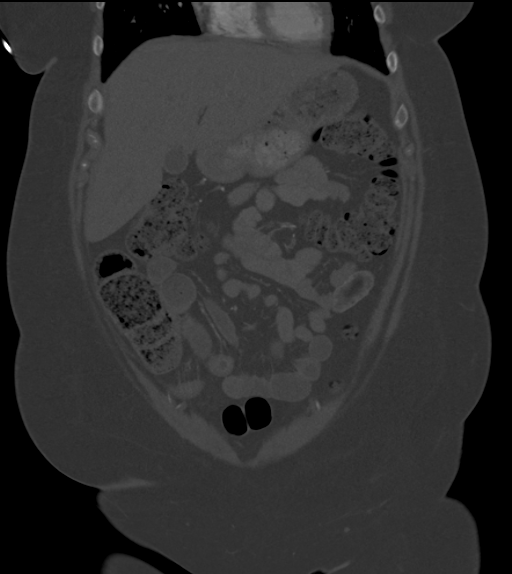
[im 94/141  soft-tissue]
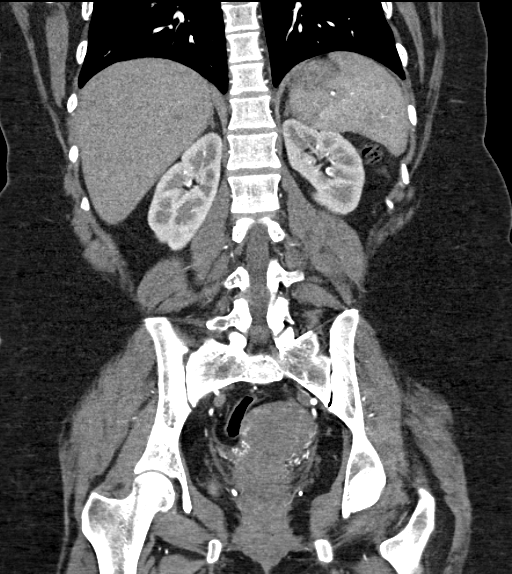

[Series 11: venous 5.0 i30f 1 · axial · portal-venous · 0.82mm/px · z∈[-362,-32]mm · 7 of 89 slices shown, 12 images]
[im 12/89  soft-tissue]
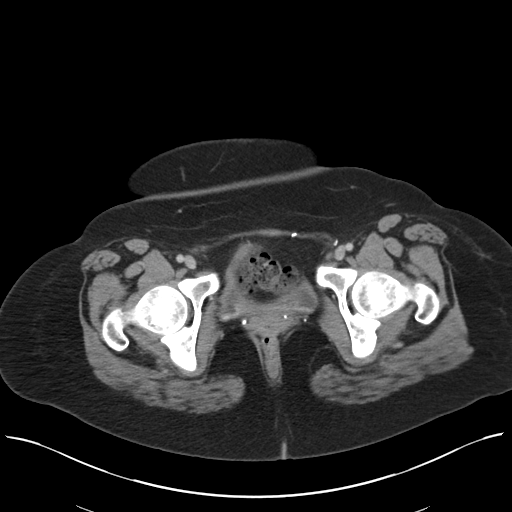
[im 12/89  bone]
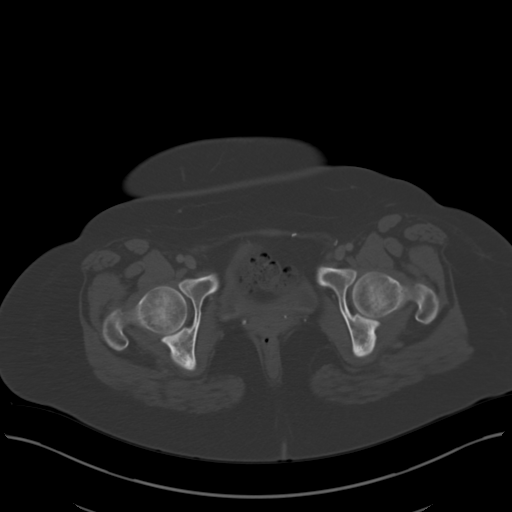
[im 23/89  soft-tissue]
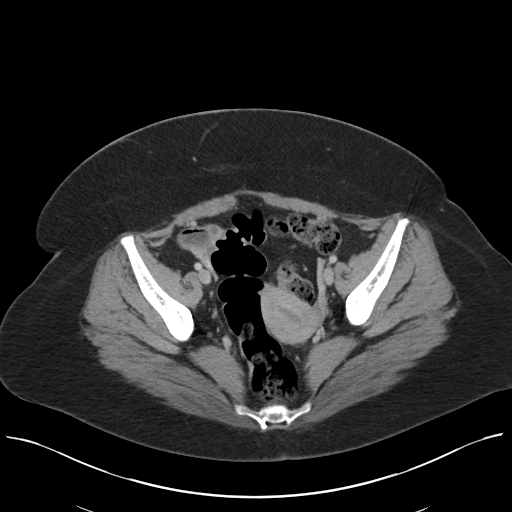
[im 34/89  soft-tissue]
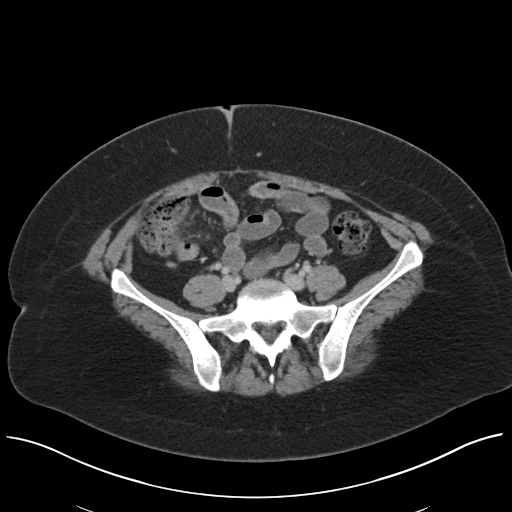
[im 45/89  soft-tissue]
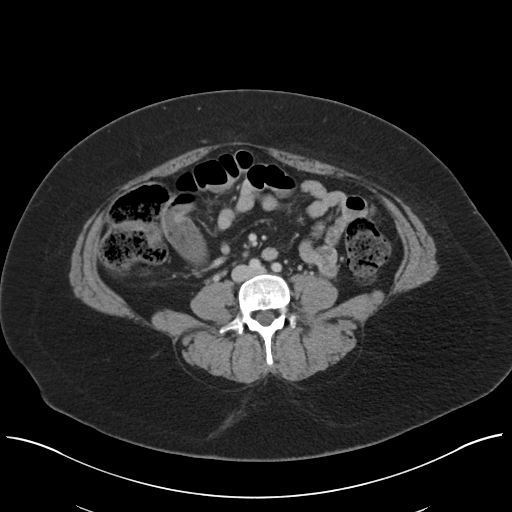
[im 45/89  lung]
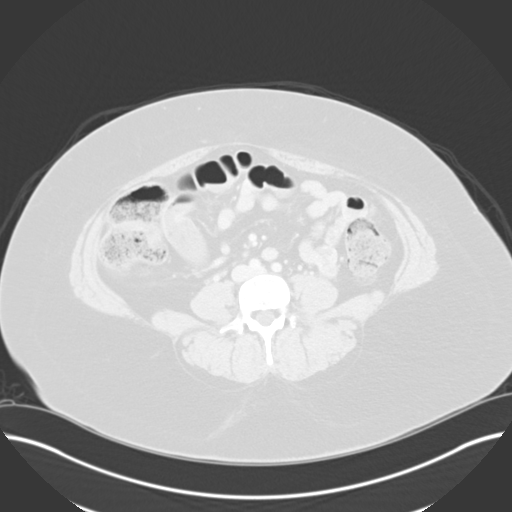
[im 56/89  soft-tissue]
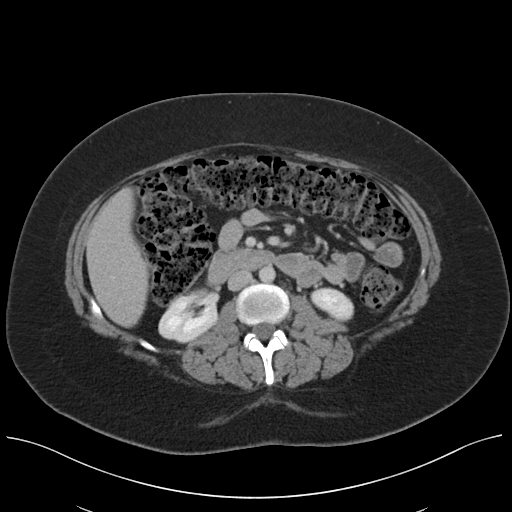
[im 56/89  lung]
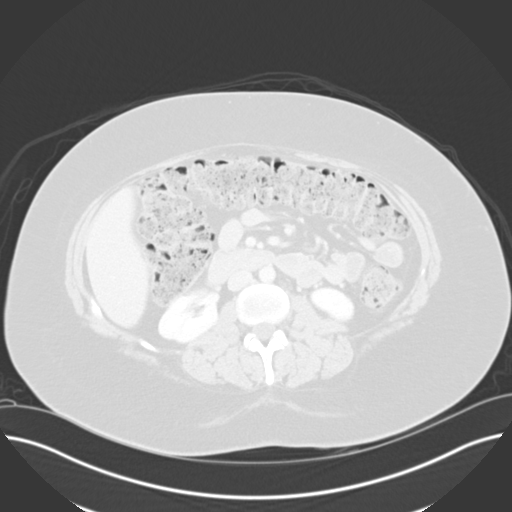
[im 67/89  soft-tissue]
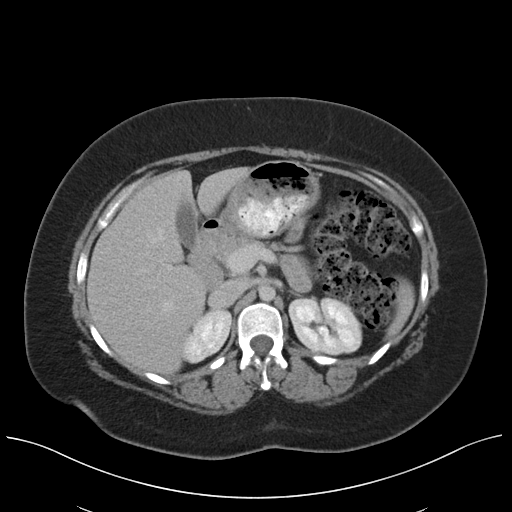
[im 67/89  lung]
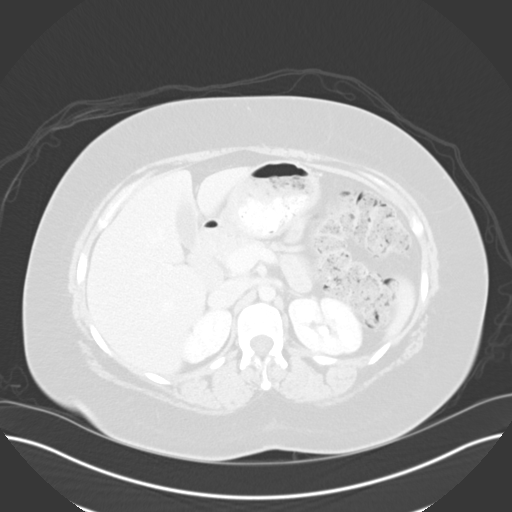
[im 78/89  soft-tissue]
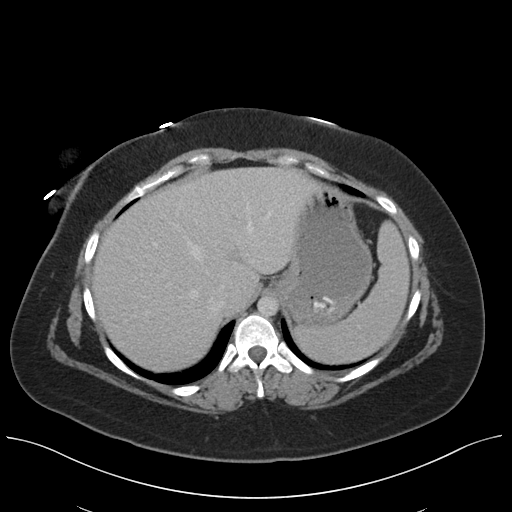
[im 78/89  lung]
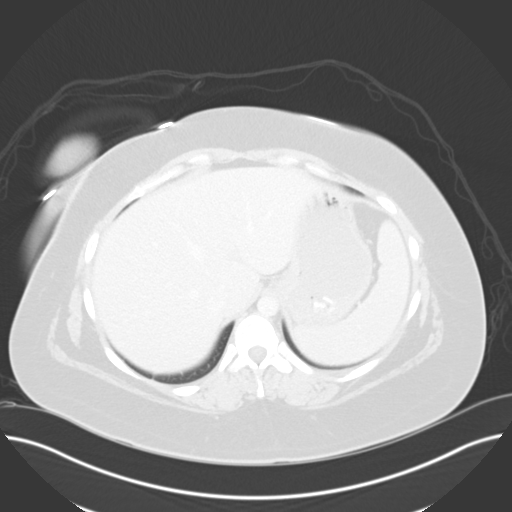

[9 of 46 positions shown; findings below may reference images not displayed]

FINDINGS: Vascular Findings:

Abdominal aorta: There is no atherosclerotic plaque within a normal
caliber abdominal aorta. No abdominal aortic dissection No aortic
wall thickening or periaortic stranding.

Celiac artery: Widely patent.  Conventional branching pattern.

SMA: Widely patent. A replaced right hepatic artery arises from the
proximal SMA. The distal tributaries of the SMA are widely patent
without discrete intraluminal filling defect to suggest distal
embolism.

Right Renal artery: Duplicated; there is a accessory right-sided
renal artery which arises from the caudal aspect of the abdominal
aorta, caudal to the take-off of the IMA, which supplies the
inferior pole of the right kidney. The dominant cranial right-sided
renal artery is widely patent without a hemodynamically significant
stenosis. No vessel irregularity to suggest FMD.

Left Renal artery: Duplicated; there is an accessory left renal
artery which supplies the inferior pole of the left kidney. The
dominant left-sided renal artery is widely patent without a
hemodynamically significant stenosis. No vessel irregularity to
suggest FMD.

IMA: Widely patent without hemodynamically significant stenosis.

Pelvic vasculature: The bilateral common, external and internal
iliac arteries are of normal caliber and widely patent without
hemodynamically significant stenosis.

Venous Findings: The portal vein is widely patent. The IVC and
bilateral pelvic venous systems are widely patent.

Review of the MIP images confirms the above findings.

--------------------------------------------------------------------------------

Nonvascular Findings:

Normal hepatic contour. There is a minimal amount of focal fatty
infiltration adjacent to the fissure for ligamentum teres. No
discrete hepatic lesions. Normal appearance of the gallbladder. No
intra extrahepatic biliary duct dilatation. No ascites.

There is symmetric enhancement and excretion of the bilateral
kidneys. No definite renal stones in this postcontrast examination.
No discrete renal lesions. No urinary obstruction or perinephric
stranding. Normal appearance of the bilateral adrenal glands,
pancreas and spleen.

Note is again made of short segment bowel wall thickening involving
an approximately 20 cm segment of the distal ileum though sparing
the terminal ileum (representative images 57 through 62, series 11).
This finding is associated with mild upstream distension of the
adjacent distal ileum (representative image 42, series 11) but not
resulting in enteric obstruction. The amount of previously noted
adjacent mesenteric stranding and reactive adenopathy within the
adjacent mesenteries has decreased in the interval. No evidence of
perforation or definable/drainable fluid collection. No
pneumoperitoneum, pneumatosis or portal venous gas.

Large colonic stool burden. Normal appearance of the retrocecal
appendix.

No bulky retroperitoneal, mesenteric, pelvic or inguinal
lymphadenopathy.

Normal appearance of the pelvic organs. No discrete adnexal lesion.
Normal appearance of the urinary bladder given underdistention. No
free fluid in the pelvic cul-de-sac.

Limited visualization of lower thorax demonstrates minimal
subsegmental atelectasis within the image caudal aspect the left
lower lobe. No focal airspace opacities. No pleural effusion. Normal
heart size. No pericardial effusion.

No acute or aggressive osseous abnormalities. Note is made of mild
bilateral symmetric sacroiliitis potentially attributable to
provided history of Crohn's disease.

Tiny mesenteric fat containing periumbilical hernia. There is a
minimal amount of soft tissue stranding about the midline of the low
back.
IMPRESSION: Vascular Impression:

1. Unremarkable CTA of the abdomen and pelvis. No evidence of
mesenteric ischemia or portal vein thrombosis.
2. Incidentally noted congenital non-conventional abdominal
vasculature (Including duplicated bilateral renal arteries) as
detailed above of no clinical concern or significance.
Nonvascular Impression:

1. Minimally improved bowel wall thickening involving an
approximately 20 cm segment of the distal ileum with sparing of the
TI and cecum, findings compatible with provided history of Crohn's
disease. There is mild upstream distention of the adjacent small
bowel without evidence of enteric obstruction. No evidence of
perforation or definable/drainable fluid collection.
2. Suspected mild bilateral symmetric sacroiliitis, potentially
attributable to provided history of Crohn's disease.

## 2015-10-12 ENCOUNTER — Telehealth: Payer: Self-pay | Admitting: Family Medicine

## 2015-10-12 NOTE — Telephone Encounter (Signed)
Pt. Called stating that she is throwing up blood and needs to speak with her nurse b/c she is taking blood thinner medication. Pt. Was advised to go to urgent care or the ED. Please f/u with pt.

## 2016-02-05 ENCOUNTER — Telehealth: Payer: Self-pay | Admitting: Family Medicine

## 2016-02-05 NOTE — Telephone Encounter (Signed)
Patient called to schedule an appt. With pcp for referral..patient was offered an appt. 12/11 at 9 am, she became upset and states she can never get it to see her doctor. Patient refused appt.

## 2016-03-25 ENCOUNTER — Observation Stay (HOSPITAL_BASED_OUTPATIENT_CLINIC_OR_DEPARTMENT_OTHER): Payer: Medicaid Other

## 2016-03-25 ENCOUNTER — Encounter (HOSPITAL_COMMUNITY): Payer: Self-pay

## 2016-03-25 ENCOUNTER — Observation Stay (HOSPITAL_COMMUNITY): Payer: Medicaid Other

## 2016-03-25 ENCOUNTER — Emergency Department (HOSPITAL_COMMUNITY): Payer: Medicaid Other

## 2016-03-25 ENCOUNTER — Observation Stay (HOSPITAL_COMMUNITY)
Admission: EM | Admit: 2016-03-25 | Discharge: 2016-03-27 | Disposition: A | Discharge status: Home / Self Care | Payer: Medicaid Other | Attending: Internal Medicine | Admitting: Internal Medicine

## 2016-03-25 DIAGNOSIS — R531 Weakness: Secondary | ICD-10-CM | POA: Diagnosis not present

## 2016-03-25 DIAGNOSIS — Z720 Tobacco use: Secondary | ICD-10-CM | POA: Diagnosis present

## 2016-03-25 DIAGNOSIS — G08 Intracranial and intraspinal phlebitis and thrombophlebitis: Principal | ICD-10-CM

## 2016-03-25 DIAGNOSIS — M25511 Pain in right shoulder: Secondary | ICD-10-CM | POA: Diagnosis not present

## 2016-03-25 DIAGNOSIS — I81 Portal vein thrombosis: Secondary | ICD-10-CM | POA: Diagnosis not present

## 2016-03-25 DIAGNOSIS — F1120 Opioid dependence, uncomplicated: Secondary | ICD-10-CM | POA: Diagnosis present

## 2016-03-25 DIAGNOSIS — K5090 Crohn's disease, unspecified, without complications: Secondary | ICD-10-CM | POA: Insufficient documentation

## 2016-03-25 DIAGNOSIS — Z8673 Personal history of transient ischemic attack (TIA), and cerebral infarction without residual deficits: Secondary | ICD-10-CM | POA: Insufficient documentation

## 2016-03-25 DIAGNOSIS — I6789 Other cerebrovascular disease: Secondary | ICD-10-CM

## 2016-03-25 DIAGNOSIS — R29898 Other symptoms and signs involving the musculoskeletal system: Secondary | ICD-10-CM | POA: Diagnosis not present

## 2016-03-25 DIAGNOSIS — Z86711 Personal history of pulmonary embolism: Secondary | ICD-10-CM | POA: Diagnosis not present

## 2016-03-25 DIAGNOSIS — D649 Anemia, unspecified: Secondary | ICD-10-CM | POA: Diagnosis not present

## 2016-03-25 DIAGNOSIS — R200 Anesthesia of skin: Secondary | ICD-10-CM

## 2016-03-25 DIAGNOSIS — Z9114 Patient's other noncompliance with medication regimen: Secondary | ICD-10-CM | POA: Diagnosis not present

## 2016-03-25 DIAGNOSIS — Z79899 Other long term (current) drug therapy: Secondary | ICD-10-CM | POA: Insufficient documentation

## 2016-03-25 DIAGNOSIS — I2699 Other pulmonary embolism without acute cor pulmonale: Secondary | ICD-10-CM | POA: Diagnosis present

## 2016-03-25 DIAGNOSIS — N920 Excessive and frequent menstruation with regular cycle: Secondary | ICD-10-CM | POA: Diagnosis not present

## 2016-03-25 DIAGNOSIS — D6859 Other primary thrombophilia: Secondary | ICD-10-CM | POA: Diagnosis not present

## 2016-03-25 DIAGNOSIS — E785 Hyperlipidemia, unspecified: Secondary | ICD-10-CM | POA: Insufficient documentation

## 2016-03-25 DIAGNOSIS — Z9049 Acquired absence of other specified parts of digestive tract: Secondary | ICD-10-CM | POA: Diagnosis not present

## 2016-03-25 DIAGNOSIS — F449 Dissociative and conversion disorder, unspecified: Secondary | ICD-10-CM | POA: Insufficient documentation

## 2016-03-25 DIAGNOSIS — E669 Obesity, unspecified: Secondary | ICD-10-CM | POA: Insufficient documentation

## 2016-03-25 DIAGNOSIS — F17210 Nicotine dependence, cigarettes, uncomplicated: Secondary | ICD-10-CM | POA: Diagnosis not present

## 2016-03-25 DIAGNOSIS — R079 Chest pain, unspecified: Secondary | ICD-10-CM | POA: Diagnosis not present

## 2016-03-25 DIAGNOSIS — R569 Unspecified convulsions: Secondary | ICD-10-CM

## 2016-03-25 DIAGNOSIS — Z79891 Long term (current) use of opiate analgesic: Secondary | ICD-10-CM | POA: Diagnosis present

## 2016-03-25 DIAGNOSIS — Z6831 Body mass index (BMI) 31.0-31.9, adult: Secondary | ICD-10-CM | POA: Insufficient documentation

## 2016-03-25 DIAGNOSIS — R202 Paresthesia of skin: Secondary | ICD-10-CM

## 2016-03-25 DIAGNOSIS — I639 Cerebral infarction, unspecified: Secondary | ICD-10-CM

## 2016-03-25 HISTORY — DX: Other pulmonary embolism without acute cor pulmonale: I26.99

## 2016-03-25 LAB — ECHOCARDIOGRAM COMPLETE BUBBLE STUDY
E decel time: 259 msec
E/e' ratio: 7.56
FS: 30 % (ref 28–44)
IVS/LV PW RATIO, ED: 0.8
LA ID, A-P, ES: 35 mm
LA diam end sys: 35 mm
LA diam index: 1.97 cm/m2
LA vol A4C: 35.1 ml
LA vol index: 22.9 mL/m2
LA vol: 40.8 mL
LV E/e' medial: 7.56
LV E/e'average: 7.56
LV PW d: 9.75 mm — AB (ref 0.6–1.1)
LV e' LATERAL: 16 cm/s
LVOT SV: 53 mL
LVOT VTI: 23.3 cm
LVOT area: 2.27 cm2
LVOT diameter: 17 mm
LVOT peak vel: 101 cm/s
Lateral S' vel: 11.7 cm/s
MV Dec: 259
MV Peak grad: 6 mmHg
MV pk A vel: 39.2 m/s
MV pk E vel: 121 m/s
RV sys press: 28 mmHg
Reg peak vel: 248 cm/s
TAPSE: 16.9 mm
TDI e' lateral: 16
TDI e' medial: 11.1
TR max vel: 248 cm/s

## 2016-03-25 LAB — URINALYSIS, ROUTINE W REFLEX MICROSCOPIC
Bilirubin Urine: NEGATIVE
Glucose, UA: NEGATIVE mg/dL
Hgb urine dipstick: NEGATIVE
Ketones, ur: NEGATIVE mg/dL
Nitrite: NEGATIVE
Protein, ur: NEGATIVE mg/dL
Specific Gravity, Urine: 1.021 (ref 1.005–1.030)
pH: 5 (ref 5.0–8.0)

## 2016-03-25 LAB — COMPREHENSIVE METABOLIC PANEL
ALT: 20 U/L (ref 14–54)
AST: 18 U/L (ref 15–41)
Albumin: 3.8 g/dL (ref 3.5–5.0)
Alkaline Phosphatase: 51 U/L (ref 38–126)
Anion gap: 7 (ref 5–15)
BUN: 6 mg/dL (ref 6–20)
CO2: 23 mmol/L (ref 22–32)
Calcium: 9.1 mg/dL (ref 8.9–10.3)
Chloride: 109 mmol/L (ref 101–111)
Creatinine, Ser: 0.71 mg/dL (ref 0.44–1.00)
GFR calc Af Amer: >60 mL/min (ref >60)
GFR calc non Af Amer: >60 mL/min (ref >60)
Glucose, Bld: 97 mg/dL (ref 65–99)
Potassium: 3.5 mmol/L (ref 3.5–5.1)
Sodium: 139 mmol/L (ref 135–145)
Total Bilirubin: 1.9 mg/dL — ABNORMAL HIGH (ref 0.3–1.2)
Total Protein: 6.3 g/dL — ABNORMAL LOW (ref 6.5–8.1)

## 2016-03-25 LAB — APTT: aPTT: 31 seconds (ref 24–36)

## 2016-03-25 LAB — RAPID URINE DRUG SCREEN, HOSP PERFORMED
Amphetamines: NOT DETECTED
Barbiturates: NOT DETECTED
Benzodiazepines: NOT DETECTED
Cocaine: NOT DETECTED
Opiates: POSITIVE — AB
Tetrahydrocannabinol: POSITIVE — AB

## 2016-03-25 LAB — LIPID PANEL
Cholesterol: 194 mg/dL (ref 0–200)
HDL: 44 mg/dL (ref >40)
LDL Cholesterol: 130 mg/dL — ABNORMAL HIGH (ref 0–99)
Total CHOL/HDL Ratio: 4.4 RATIO
Triglycerides: 100 mg/dL (ref <150)
VLDL: 20 mg/dL (ref 0–40)

## 2016-03-25 LAB — I-STAT CHEM 8, ED
BUN: 5 mg/dL — ABNORMAL LOW (ref 6–20)
Calcium, Ion: 1.06 mmol/L — ABNORMAL LOW (ref 1.15–1.40)
Chloride: 106 mmol/L (ref 101–111)
Creatinine, Ser: 0.8 mg/dL (ref 0.44–1.00)
Glucose, Bld: 103 mg/dL — ABNORMAL HIGH (ref 65–99)
HCT: 40 % (ref 36.0–46.0)
Hemoglobin: 13.6 g/dL (ref 12.0–15.0)
Potassium: 3.5 mmol/L (ref 3.5–5.1)
Sodium: 139 mmol/L (ref 135–145)
TCO2: 19 mmol/L (ref 0–100)

## 2016-03-25 LAB — TROPONIN I
Troponin I: <0.03 ng/mL (ref <0.03)
Troponin I: <0.03 ng/mL (ref <0.03)
Troponin I: <0.03 ng/mL (ref <0.03)

## 2016-03-25 LAB — DIFFERENTIAL
Basophils Absolute: 0 10*3/uL (ref 0.0–0.1)
Basophils Relative: 1 %
Eosinophils Absolute: 0.2 10*3/uL (ref 0.0–0.7)
Eosinophils Relative: 3 %
Lymphocytes Relative: 42 %
Lymphs Abs: 2.4 10*3/uL (ref 0.7–4.0)
Monocytes Absolute: 0.2 10*3/uL (ref 0.1–1.0)
Monocytes Relative: 3 %
Neutro Abs: 2.9 10*3/uL (ref 1.7–7.7)
Neutrophils Relative %: 51 %

## 2016-03-25 LAB — I-STAT BETA HCG BLOOD, ED (MC, WL, AP ONLY): I-stat hCG, quantitative: <5 m[IU]/mL (ref <5)

## 2016-03-25 LAB — CBC
HCT: 41.6 % (ref 36.0–46.0)
Hemoglobin: 14.9 g/dL (ref 12.0–15.0)
MCH: 32.7 pg (ref 26.0–34.0)
MCHC: 35.8 g/dL (ref 30.0–36.0)
MCV: 91.2 fL (ref 78.0–100.0)
Platelets: 183 10*3/uL (ref 150–400)
RBC: 4.56 MIL/uL (ref 3.87–5.11)
RDW: 12.4 % (ref 11.5–15.5)
WBC: 5.7 10*3/uL (ref 4.0–10.5)

## 2016-03-25 LAB — PROTIME-INR
INR: 1
Prothrombin Time: 13.2 seconds (ref 11.4–15.2)

## 2016-03-25 LAB — ETHANOL: Alcohol, Ethyl (B): <5 mg/dL (ref <5)

## 2016-03-25 LAB — I-STAT TROPONIN, ED: Troponin i, poc: 0.01 ng/mL (ref 0.00–0.08)

## 2016-03-25 MED ORDER — OXYCODONE HCL 5 MG PO TABS
10.0000 mg | ORAL_TABLET | Freq: Four times a day (QID) | ORAL | Status: DC
  Administered 2016-03-25 – 2016-03-27 (×10): 10 mg via ORAL
  Filled 2016-03-25 (×10): qty 2

## 2016-03-25 MED ORDER — NITROGLYCERIN 0.4 MG SL SUBL: 0.4000 mg | SUBLINGUAL_TABLET | SUBLINGUAL | Status: DC | PRN

## 2016-03-25 MED ORDER — SODIUM CHLORIDE 0.9 % IV BOLUS (SEPSIS)
500.0000 mL | Freq: Once | INTRAVENOUS | Status: AC
  Administered 2016-03-25: 500 mL via INTRAVENOUS

## 2016-03-25 MED ORDER — OXYCODONE HCL 5 MG PO TABS
5.0000 mg | ORAL_TABLET | Freq: Once | ORAL | Status: AC
  Administered 2016-03-25: 5 mg via ORAL
  Filled 2016-03-25: qty 1

## 2016-03-25 MED ORDER — NICOTINE 21 MG/24HR TD PT24
21.0000 mg | MEDICATED_PATCH | Freq: Every day | TRANSDERMAL | Status: DC
  Administered 2016-03-25 – 2016-03-27 (×3): 21 mg via TRANSDERMAL
  Filled 2016-03-25 (×3): qty 1

## 2016-03-25 MED ORDER — ACETAMINOPHEN 325 MG PO TABS
650.0000 mg | ORAL_TABLET | ORAL | Status: DC | PRN
  Administered 2016-03-26: 650 mg via ORAL
  Filled 2016-03-25: qty 2

## 2016-03-25 MED ORDER — METOCLOPRAMIDE HCL 5 MG/ML IJ SOLN
10.0000 mg | Freq: Once | INTRAMUSCULAR | Status: AC
  Administered 2016-03-25: 10 mg via INTRAVENOUS
  Filled 2016-03-25: qty 2

## 2016-03-25 MED ORDER — SENNOSIDES-DOCUSATE SODIUM 8.6-50 MG PO TABS
1.0000 | ORAL_TABLET | Freq: Every evening | ORAL | Status: DC | PRN
  Administered 2016-03-25 – 2016-03-26 (×2): 1 via ORAL
  Filled 2016-03-25 (×3): qty 1

## 2016-03-25 MED ORDER — SODIUM CHLORIDE 0.9 % IV SOLN
INTRAVENOUS | Status: DC
  Administered 2016-03-25 – 2016-03-26 (×2): via INTRAVENOUS

## 2016-03-25 MED ORDER — DIPHENHYDRAMINE HCL 50 MG/ML IJ SOLN
25.0000 mg | Freq: Once | INTRAMUSCULAR | Status: AC
  Administered 2016-03-25: 25 mg via INTRAVENOUS
  Filled 2016-03-25: qty 1

## 2016-03-25 MED ORDER — STROKE: EARLY STAGES OF RECOVERY BOOK
Freq: Once | Status: AC
  Administered 2016-03-25: 06:00:00
  Filled 2016-03-25: qty 1

## 2016-03-25 MED ORDER — FENTANYL CITRATE (PF) 100 MCG/2ML IJ SOLN
50.0000 ug | Freq: Once | INTRAMUSCULAR | Status: AC
  Administered 2016-03-25: 50 ug via INTRAVENOUS
  Filled 2016-03-25: qty 2

## 2016-03-25 MED ORDER — ONDANSETRON HCL 4 MG/2ML IJ SOLN
4.0000 mg | Freq: Four times a day (QID) | INTRAMUSCULAR | Status: DC | PRN
  Administered 2016-03-25: 4 mg via INTRAVENOUS

## 2016-03-25 MED ORDER — IOPAMIDOL (ISOVUE-370) INJECTION 76%
INTRAVENOUS | Status: AC
  Administered 2016-03-25: 50 mL via INTRAVENOUS
  Filled 2016-03-25: qty 50

## 2016-03-25 MED ORDER — ACETAMINOPHEN 650 MG RE SUPP: 650.0000 mg | RECTAL | Status: DC | PRN

## 2016-03-25 MED ORDER — ATORVASTATIN CALCIUM 10 MG PO TABS
20.0000 mg | ORAL_TABLET | Freq: Every day | ORAL | Status: DC
  Administered 2016-03-26: 20 mg via ORAL
  Filled 2016-03-25: qty 2

## 2016-03-25 MED ORDER — GABAPENTIN 100 MG PO CAPS
100.0000 mg | ORAL_CAPSULE | Freq: Three times a day (TID) | ORAL | Status: DC
  Administered 2016-03-25 – 2016-03-27 (×7): 100 mg via ORAL
  Filled 2016-03-25 (×7): qty 1

## 2016-03-25 MED ORDER — ALBUTEROL SULFATE (2.5 MG/3ML) 0.083% IN NEBU: 2.5000 mg | INHALATION_SOLUTION | RESPIRATORY_TRACT | Status: DC | PRN

## 2016-03-25 MED ORDER — ACETAMINOPHEN 160 MG/5ML PO SOLN: 650.0000 mg | ORAL | Status: DC | PRN

## 2016-03-25 MED ORDER — ONDANSETRON HCL 4 MG/2ML IJ SOLN
4.0000 mg | Freq: Four times a day (QID) | INTRAMUSCULAR | Status: DC
  Filled 2016-03-25: qty 2

## 2016-03-25 MED ORDER — RIVAROXABAN 20 MG PO TABS
20.0000 mg | ORAL_TABLET | Freq: Every day | ORAL | Status: DC
  Administered 2016-03-25 – 2016-03-26 (×2): 20 mg via ORAL
  Filled 2016-03-25 (×2): qty 1

## 2016-03-25 NOTE — ED Notes (Signed)
Code Stroke called in triage, pt initially presented via EMS C/O R shoulder pain which lead to R sided CP. Then upon arrival pt reported R arm numbness/weakness and eventually R leg weakness. Also reported HA. Pt was brought to CT by triage RN and I met pt there. EDP and Neurologist at bedside.

## 2016-03-25 NOTE — Progress Notes (Signed)
*  Preliminary Results* Right upper extremity venous duplex completed. Right upper extremity is negative for deep vein thrombosis. There is evidence of acute superficial vein thrombosis involving the right basilic vein surrounding the IV.  Preliminary results discussed with Tresa Endo, RN.  03/25/2016 12:52 PM  Gertie Fey, BS, RVT, RDCS, RDMS

## 2016-03-25 NOTE — Consult Note (Signed)
Referring Physician: Dr. Christy Gentles    Chief Complaint: RUE weakness with shoulder pain  HPI: Victoria Alvarez is an 26 y.o. female who initially presented to triage with right shoulder pain radiating to her chest. This progressed to acute right upper extremity weakness. Code Stroke was called.  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 Has been diagnosed with hypercoagulable state. Has Crohn's disease with rectal bleeding recently. She also has a history of seizures.    She stopped taking her Xarelto 4 days ago due to combined heavy menses and rectal bleeding.   LSN: [id number] in triage tPA Given: No, due to high volume active GI bleeding per patient  Past Medical History:  Diagnosis Date  . Anemia 07/2014   Microcytic  . Asthma   . Cerebral venous sinus thrombosis   . Chronic pelvic pain in female   . Depression 2007  . Headache(784.0)   . Hypercoagulable state, primary ([place]) 07/2014   . no clear inherited or acquired cause for the patient's coagulopathy  . Interstitial cystitis    pain from this was reason for starting opioids age 27.   . Miscarriage 2005  . Morbid obesity ([place])    BMI 37 in 11/2014.  . Non-compliance    with anticoagulant  . Numbness    right side  . Opioid dependence ([place])    to Rx opioids.  switched to Methadone after second child born in 04/2014  . 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 Pneumonia 03/2014  . 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 Preeclampsia 2012  . Pyelonephritis affecting pregnancy in first trimester July 2015  . Seizures ([place])   . Stroke [facility]) 05/2014   presented with right sided weakness.     Past Surgical History:  Procedure Laterality Date  . ABDOMINAL SURGERY    . CESAREAN SECTION N/A 05/06/2014   Procedure: CESAREAN SECTION;  Surgeon: Truett Mainland, DO;  Location: [facility];  Service: Obstetrics;   Laterality: N/A;  . COLONOSCOPY WITH PROPOFOL N/A 11/18/2014   Procedure: COLONOSCOPY WITH PROPOFOL;  Surgeon: Milus Banister, MD;  Location: [place];  Service: Endoscopy;  Laterality: N/A;  . INGUINAL HERNIA REPAIR Left 2008  . VAGINA SURGERY  ~ 2011   benign vaginal tumor removed at [facility].     Family History  Problem Relation Age of Onset  . Diabetes Mother   . Heart disease Mother   . Hypertension Father   . Diabetes Maternal Grandmother   . Hypertension Maternal Grandmother   . Stroke Maternal Grandmother   . Diabetes Maternal Grandfather   . Hypertension Maternal Grandfather   . Diabetes Paternal Grandmother   . Diabetes Paternal Grandfather    Social History:  reports that she has been smoking Cigarettes.  She has a 2.00 pack-year smoking history. She has never used smokeless tobacco. She reports that she does not drink alcohol or use drugs.  Allergies:  Allergies  Allergen Reactions  . Bee Venom Swelling    Medications:  Oxycodone HCl 10 MG TABS Take 10 mg by mouth every 6 (six) hours. Ivor Costa, MD Reordered  Orderedas:oxyCODONE (Oxy IR/ROXICODONE) immediate release tablet 10 mg - 10 mg, Oral, Every 6 hours, First dose on Mon 03/25/16 at 0415  rivaroxaban (XARELTO) 20 MG TABS tablet Take 1 tablet (20 mg total) by mouth daily with supper. Ivor Costa, MD Not Ordered  ferrous sulfate 325 (65 FE) MG tablet Take 1 tablet (325 mg total) by mouth 3 (three) times daily with meals. Ivor Costa, MD Not Ordered   Patient not taking: Reported on 03/25/2016    levETIRAcetam (KEPPRA) 500 MG tablet Take 1 tablet (500 mg total) by mouth 2 (two) times daily. Ivor Costa, MD Not Ordered   Patient not taking: Reported on 03/25/2016    methadone (DOLOPHINE) 5 MG tablet Take 17 tablets (85 mg total) by mouth daily. Home medication continued Ivor Costa, MD Not Ordered   Patient not taking: Reported on 03/25/2016    ondansetron (ZOFRAN) 4 MG tablet Take 1 tablet (4 mg total) by mouth every 6  (six) hours as needed for nausea. Ivor Costa, MD Not Ordered   Patient not taking: Reported on 03/25/2016    polyethylene glycol (MIRALAX / GLYCOLAX) packet Take 17 g by mouth daily. Ivor Costa, MD Not Ordered   Patient not taking: Reported on 03/25/2016    Rivaroxaban (XARELTO) 15 MG TABS tablet Take 1 tablet (15 mg total) by mouth 2 (two) times daily with a meal. Ivor Costa, MD Not Ordered   Patient not taking: Reported on 03/25/2016       ROS: Positive for chest pain. No fevers or chills. No SOB, vision changes, nausea or vomiting.   Physical Examination: Blood pressure 123/79, pulse 74, temperature 98.2 F (36.8 C), temperature source Oral, resp. rate 18, height 5' 2"  (1.575 m), weight 77.1 kg (170 lb), last menstrual period 03/18/2016, SpO2 98 %, not currently breastfeeding.  HEENT: [place]/AT Abdomen: Scarring from prior surgery noted.  Ext: Cool to touch without cyanosis.   Neurologic Examination: Ment: Awake and alert with anxious, tearful affect. Speech fluent with intact comprehension and naming. CN: PERRL. Visual fields intact. EOMI without nystagmus. Face symmetric. Facial temperature sensation normal. Tongue protrudes midline.  Motor: LUE and LLE 5/5.  RUE 4/5 with giveway weakness and some variability.  RLE: 4/5 with giveway weakness and some variability.  Sensory: Patchy sensory changes to FT and temp without clear localization.  Reflexes: 3+ in upper and lower extremities bilaterally. Toes downgoing.  Cerebellar: No ataxia with FNF bilaterally.  Gait: Deferred.   Results for orders placed or performed during the hospital encounter of 03/25/16 (from the past 48 hour(s))  Protime-INR     Status: None   Collection Time: 03/25/16 12:37 AM  Result Value Ref Range   Prothrombin Time 13.2 11.4 - 15.2 seconds   INR 1.00   APTT     Status: None   Collection Time: 03/25/16 12:37 AM  Result Value Ref Range   aPTT 31 24 - 36 seconds  CBC     Status: None   Collection Time: 03/25/16  12:37 AM  Result Value Ref Range   WBC 5.7 4.0 - 10.5 K/uL   RBC 4.56 3.87 - 5.11 MIL/uL   Hemoglobin 14.9 12.0 - 15.0 g/dL   HCT 41.6 36.0 - 46.0 %   MCV 91.2 78.0 - 100.0 fL   MCH 32.7 26.0 - 34.0 pg   MCHC 35.8 30.0 - 36.0 g/dL   RDW 12.4 11.5 - 15.5 %   Platelets 183 150 - 400 K/uL  Differential     Status: None   Collection Time: 03/25/16 12:37 AM  Result Value Ref Range   Neutrophils Relative % 51 %   Neutro Abs 2.9 1.7 - 7.7 K/uL   Lymphocytes Relative 42 %   Lymphs Abs 2.4 0.7 - 4.0 K/uL  Monocytes Relative 3 %   Monocytes Absolute 0.2 0.1 - 1.0 K/uL   Eosinophils Relative 3 %   Eosinophils Absolute 0.2 0.0 - 0.7 K/uL   Basophils Relative 1 %   Basophils Absolute 0.0 0.0 - 0.1 K/uL  I-stat troponin, ED (not at [facility], [facility])     Status: None   Collection Time: 03/25/16 12:48 AM  Result Value Ref Range   Troponin i, poc 0.01 0.00 - 0.08 ng/mL   Comment 3            Comment: Due to the release kinetics of cTnI, a negative result within the first hours of the onset of symptoms does not rule out myocardial infarction with certainty. If myocardial infarction is still suspected, repeat the test at appropriate intervals.   I-Stat Beta hCG blood, ED ([facility], [facility], [facility] only)     Status: None   Collection Time: 03/25/16 12:48 AM  Result Value Ref Range   I-stat hCG, quantitative <5.0 <5 mIU/mL   Comment 3            Comment:   GEST. AGE      CONC.  (mIU/mL)   <=1 WEEK        5 - 50     2 WEEKS       50 - 500     3 WEEKS       100 - 10,000     4 WEEKS     1,000 - 30,000        FEMALE AND NON-PREGNANT FEMALE:     LESS THAN 5 mIU/mL   I-Stat Chem 8, ED  (not at [facility], [facility])     Status: Abnormal   Collection Time: 03/25/16 12:49 AM  Result Value Ref Range   Sodium 139 135 - 145 mmol/L   Potassium 3.5 3.5 - 5.1 mmol/L   Chloride 106 101 - 111 mmol/L   BUN 5 (L) 6 - 20 mg/dL   Creatinine, Ser 0.80 0.44 - 1.00 mg/dL   Glucose, Bld 103 (H) 65 - 99 mg/dL   Calcium, Ion 1.06 (L) 1.15  - 1.40 mmol/L   TCO2 19 0 - 100 mmol/L   Hemoglobin 13.6 12.0 - 15.0 g/dL   HCT 40.0 36.0 - 46.0 %   Ct Head Code Stroke W/o Cm  Result Date: 03/25/2016 CLINICAL DATA:  Code stroke.  Right-sided weakness EXAM: CT HEAD WITHOUT CONTRAST TECHNIQUE: Contiguous axial images were obtained from the base of the skull through the vertex without intravenous contrast. COMPARISON:  None. FINDINGS: Brain: No mass lesion, intraparenchymal hemorrhage or extra-axial collection. No evidence of acute cortical infarct. Brain parenchyma and CSF-containing spaces are normal for age. Vascular: No hyperdense vessel or unexpected calcification. Skull: Normal visualized skull base, calvarium and extracranial soft tissues. Sinuses/Orbits: No sinus fluid levels or advanced mucosal thickening. No mastoid effusion. Normal orbits. ASPECTS [facility] Stroke Program Early CT Score) - Ganglionic level infarction (caudate, lentiform nuclei, internal capsule, insula, M1-M3 cortex): 7 - Supraganglionic infarction (M4-M6 cortex): 3 Total score (0-10 with 10 being normal): 10 IMPRESSION: 1. Normal head CT. 2. ASPECTS is 10. These results were called by telephone at the time of interpretation on 03/25/2016 at 1:02 am to Dr. Kerney Elbe, who verbally acknowledged these results. Electronically Signed   By: Ulyses Jarred M.D.   On: 03/25/2016 01:04    Assessment: 26 y.o. female with acute onset of RUE weakness. She has a prior history of hypercoagulability and cerebral venous sinus thrombosis  with left cerebral convexity stroke in 2016.  1. Exam findings with some variability and inconsistency. Initial symptom of right shoulder pain with radiation to chest followed by right arm pain also atypical for stroke. CT head without acute hemorrhage or hypodensity. CTA of head and neck without LVO. Venous sinuses also appear grossly patent.  2. Given nondiagnostic findings above, a STAT MRI of the brain was ordered, revealing no acute focus of restricted  diffusion.  3. History of seizures.   Plan: 1. TTE with bubble study.  2. Ultrasound of bilateral lower extremities to assess for possible DVT.  3. Venous and arterial ultrasound of shoulder and upper arm to assess for possible spontaneous arterial occlusion or upper extremity DVT.  4. PT/OT/Speech.  5. Restart Xarelto.  6. Telemetry monitoring 7. Frequent neuro checks  @Electronically  signed: Dr. Kerney Elbe  03/25/2016, 1:22 AM

## 2016-03-25 NOTE — Progress Notes (Signed)
STROKE TEAM PROGRESS NOTE   HISTORY OF PRESENT ILLNESS (per record) Victoria Alvarez is an 26 y.o. female who initially presented to triage with right shoulder pain radiating to her chest. This progressed to acute right upper extremity weakness. Code Stroke was called.  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 Has been diagnosed with hypercoagulable state. Has Crohn's disease with rectal bleeding recently. She also has a history of seizures.  She stopped taking her Xarelto 4 days ago due to combined heavy menses and rectal bleeding. She was LKW at 2339 in triage on 03/24/2016. Patient was not administered IV t-PA secondary to due to high volume active GI bleeding per pt. She was admitted for further evaluation and treatment.[name] crazy may be wall   SUBJECTIVE (INTERVAL HISTORY) Husband at bedside. Pt still complains of right shoulder pain and right arm tingling and numbness. She can not lift arm up. However, on exam with distraction, she is able to move her left arm. She stated that she stopped Xarelto due to menorrhagia.    OBJECTIVE Temp:  [97.8 F (36.6 C)-98.2 F (36.8 C)] 97.9 F (36.6 C) (01/15 0735) Pulse Rate:  [46-74] 57 (01/15 0947) Cardiac Rhythm: Normal sinus rhythm (01/15 0700) Resp:  [14-23] 15 (01/15 0947) BP: (89-123)/(41-79) 103/63 (01/15 0947) SpO2:  [97 %-100 %] 97 % (01/15 0947) Weight:  [76.9 kg (169 lb 8.5 oz)-77.1 kg (170 lb)] 76.9 kg (169 lb 8.5 oz) (01/15 0530)  CBC:  Recent Labs Lab 03/25/16 0037 03/25/16 0049  WBC 5.7  --   NEUTROABS 2.9  --   HGB 14.9 13.6  HCT 41.6 40.0  MCV 91.2  --   PLT 183  --     Basic Metabolic Panel:  Recent Labs Lab 03/25/16 0037 03/25/16 0049  NA 139 139  K 3.5 3.5  CL 109 106  CO2 23  --   GLUCOSE 97 103*  BUN 6 5*  CREATININE 0.71 0.80  CALCIUM 9.1  --      Lipid Panel:    Component Value Date/Time   CHOL 194 03/25/2016 0448   TRIG 100 03/25/2016 0448   HDL 44 03/25/2016 0448   CHOLHDL 4.4 03/25/2016 0448   VLDL 20 03/25/2016 0448   LDLCALC 130 (H) 03/25/2016 0448   HgbA1c: No results found for: HGBA1C Urine Drug Screen:    Component Value Date/Time   LABOPIA NONE DETECTED 11/03/2014 1729   COCAINSCRNUR NONE DETECTED 11/03/2014 1729   COCAINSCRNUR NEG 03/01/2014 1006   LABBENZ NONE DETECTED 11/03/2014 1729   AMPHETMU NONE DETECTED 11/03/2014 1729   THCU NONE DETECTED 11/03/2014 1729   LABBARB NONE DETECTED 11/03/2014 1729      IMAGING I have personally reviewed the radiological images below and agree with the radiology interpretations.  Ct Head Code Stroke W/o Cm 03/25/2016 1. Normal head CT. 2. ASPECTS is 10.  Ct Angio Head & Neck  03/25/2016 Normal CTA of the head and neck. No CVST.  Mr Brain Wo Contrast 03/25/2016 1. No acute intracranial abnormality.   Mr [name] 03/25/2016 2. Area of absent flow related enhancement within the posterior vertex area of the superior sagittal sinus is unchanged compared to 08/05/2014. This may simply be a arachnoid granulation, though residual thrombus remains a possibility. There is no increase or new evidence of venous sinus thrombosis. 3. For future evaluation for  potential dural venous sinus thrombosis, CT venography is superior to MR venography.  Dg Chest 2 View  03/25/2016 No active cardiopulmonary disease.   RUE venous doppler -  negative for deep vein thrombosis. There is evidence of acute superficial vein thrombosis involving the right basilic vein surrounding the IV.   PHYSICAL EXAM  Temp:  [97.8 F (36.6 C)-98.2 F (36.8 C)] 97.9 F (36.6 C) (01/15 0735) Pulse Rate:  [46-74] 55 (01/15 1330) Resp:  [14-23] 16 (01/15 1330) BP: (89-123)/(41-79) 106/67 (01/15 1330) SpO2:  [97 %-100 %] 100 % (01/15 1330) Weight:  [169 lb 8.5 oz (76.9 kg)-170 lb (77.1 kg)] 169 lb  8.5 oz (76.9 kg) (01/15 0530)  General - Well nourished, well developed, in mildly acute pain with right shoulder.  Ophthalmologic - Fundi not visualized due to noncooperation.  Cardiovascular - Regular rate and rhythm.  Mental Status -  Level of arousal and orientation to time, place, and person were intact. Language including expression, naming, repetition, comprehension was assessed and found intact. Fund of Knowledge was assessed and was intact.  Cranial Nerves II - XII - II - Visual field intact OU. III, IV, VI - Extraocular movements intact. V - decreased light touch and pinprick on the right, however, also positive on tuning fork test. VII - Facial movement intact bilaterally. VIII - Hearing & vestibular intact bilaterally. X - Palate elevates symmetrically. XI - Chin turning & shoulder shrug intact bilaterally. XII - Tongue protrusion intact.  Motor Strength - The patient's strength was normal in all extremities except RUE 0/5 spontaneous movement, however, with distraction, pt is able to hold arm against gravity, 4/5 distal finger movement and grip.  Bulk was normal and fasciculations were absent.   Motor Tone - Muscle tone was assessed at the neck and appendages and was normal.  Reflexes - The patient's reflexes were 2+ BUEs and 3+ BLEs and she had no pathological reflexes.  Sensory - Light touch, temperature/pinprick were assessed and were decreased on the right UE.    Coordination - The patient had normal movements in the left hand with no ataxia or dysmetria.  Tremor was absent.  Gait and Station - deferred.   ASSESSMENT/PLAN [name] is a 26 y.o. 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 She did not receive IV t-PA due to high volume rectal bleeding.    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 - eliquis changed to Xarelto for better compliance   xarelto for VTE prophylaxis  Diet Heart Room service appropriate? Yes; Fluid consistency: Thin  Xarelto (rivaroxaban) daily prior to admission stop 4 days prior due to heavy  menses and rectal bleeding, now on Xarelto (rivaroxaban) daily  Patient counseled to be compliant with her antithrombotic medications  Ongoing aggressive stroke risk factor management  Therapy recommendations:  pending   Disposition:  Return home  Menorrhagia  Reason for pt stopped Xarelto lately  Recommend to discuss with OBGYN about menses suppression therapy   But recommend to avoid estrogen due to risk of thrombosis  Hyperlipidemia  Home meds:  No statin  LDL 130, goal < 70  Add lipitor 9m  Continue statin at discharge  Tobacco abuse  Current smoker  Smoking increase the risk of thrombosis especially for her with hypercoagulable state  Smoking cessation counseling provided  Nicotine patch provided  Pt is willing to quit  Other Stroke Risk Factors  Obesity, Body mass index is 31.01 kg/m., recommend weight loss, diet and exercise as appropriate   Family hx stroke (maternal grandfather)  migraine  Other Active  Problems  Chest pain  Seizures on Keppra  Depression  S/p ileocolectomy   Hospital day # 0  JRosalin Hawking MD PhD Stroke Neurology 03/25/2016 6:12 PM    To contact Stroke Continuity provider, please refer to [email or web address] After hours, contact General Neurology

## 2016-03-25 NOTE — Progress Notes (Addendum)
Vascular notified RN of right superficial clot by IV site. Patient states she has mild discomfort by IV site. No redness, tenderness, edema noted. IV was removed. Dr Izola Price notified

## 2016-03-25 NOTE — Progress Notes (Signed)
Pt admitted from E.D via stretcher. Patient alert and oriented X4 welcomed to unit  Oriented to room Telemetry applied and verified.

## 2016-03-25 NOTE — Discharge Instructions (Addendum)
Information on my medicine - XARELTO (rivaroxaban)  This medication education was reviewed with me or my healthcare representative as part of my discharge preparation.  The pharmacist that spoke with me during my hospital stay was:  Lennon Alstrom, [facility]  WHY WAS Carlena Hurl PRESCRIBED FOR YOU? Xarelto was prescribed to treat blood clots that may have been found in the veins of your legs (deep vein thrombosis) or in your lungs (pulmonary embolism) and to reduce the risk of them occurring again.  What do you need to know about Xarelto? The dose is one 20 mg tablet taken ONCE A DAY with your evening meal.   Tendinitis Tendinitis is inflammation of a tendon. A tendon is a strong cord of tissue that connects muscle to bone. Tendinitis can affect any tendon, but it most commonly affects the shoulder tendon (rotator cuff), ankle tendon (Achilles tendon), elbow tendon (triceps tendon), or one of the tendons in the wrist. What are the causes? This condition may be caused by:  Overusing a tendon or muscle. This is common.  Age-related wear and tear.  Injury.  Inflammatory conditions, such as arthritis.  Certain medicines. What increases the risk? This condition is more likely to develop in people who do activities that involve repetitive motions. What are the signs or symptoms? Symptoms of this condition may include:  Pain.  Tenderness.  Mild swelling. How is this diagnosed? This condition is diagnosed with a physical exam. You may also have tests, such as:  Ultrasound. This uses sound waves to make an image of your affected area.  MRI. How is this treated? This condition may be treated by resting, icing, applying pressure (compression), and raising (elevating) the area above the level of your heart. This is known as RICE therapy. Treatment may also include:  Medicines to help reduce inflammation or to help reduce pain.  Exercises or physical therapy to strengthen and stretch the  tendon.  A brace or splint.  Surgery (rare). Follow these instructions at home:   If you have a splint or brace:  Wear the splint or brace as told by your health care provider. Remove it only as told by your health care provider.  Loosen the splint or brace if your fingers or toes tingle, become numb, or turn cold and blue.  Do not take baths, swim, or use a hot tub until your health care provider approves. Ask your health care provider if you can take showers. You may only be allowed to take sponge baths for bathing.  Do not let your splint or brace get wet if it is not waterproof.  If your splint or brace is not waterproof, cover it with a watertight plastic bag when you take a bath or a shower.  Keep the splint or brace clean. Managing pain, stiffness, and swelling  If directed, apply ice to the affected area.  Put ice in a plastic bag.  Place a towel between your skin and the bag.  Leave the ice on for 20 minutes, 2-3 times a day.  If directed, apply heat to the affected area as often as told by your health care provider. Use the heat source that your health care provider recommends, such as a moist heat pack or a heating pad.  Place a towel between your skin and the heat source.  Leave the heat on for 20-30 minutes.  Remove the heat if your skin turns bright red. This is especially important if you are unable to feel pain, heat,  or cold. You may have a greater risk of getting burned.  Move the fingers or toes of the affected limb often, if this applies. This can help to prevent stiffness and lessen swelling.  If directed, elevate the affected area above the level of your heart while you are sitting or lying down. Driving  Do not drive or operate heavy machinery while taking prescription pain medicine.  Ask your health care provider when it is safe to drive if you have a splint or brace on any part of your arm or leg. Activity  Return to your normal activities as  told by your health care provider. Ask your health care provider what activities are safe for you.  Rest the affected area as told by your health care provider.  Avoid using the affected area while you are experiencing symptoms of tendinitis.  Do exercises as told by your health care provider. General instructions  If you have a splint, do not put pressure on any part of the splint until it is fully hardened. This may take several hours.  Wear an elastic bandage or compression wrap only as told by your health care provider.  Take over-the-counter and prescription medicines only as told by your health care provider.  Keep all follow-up visits as told by your health care provider. This is important. Contact a health care provider if:  Your symptoms do not improve.  You develop new, unexplained problems, such as numbness in your hands. This information is not intended to replace advice given to you by your health care provider. Make sure you discuss any questions you have with your health care provider. Document Released: 02/23/2000 Document Revised: 10/26/2015 Document Reviewed: 11/28/2014 Elsevier Interactive Patient Education  2017 [employer].    After discharge, you should have regular check-up appointments with your healthcare provider that is prescribing your Xarelto.  In the future your dose may need to be changed if your kidney function changes by a significant amount.  What do you do if you miss a dose? If you are taking Xarelto TWICE DAILY and you miss a dose, take it as soon as you remember. You may take two 15 mg tablets (total 30 mg) at the same time then resume your regularly scheduled 15 mg twice daily the next day.  If you are taking Xarelto ONCE DAILY and you miss a dose, take it as soon as you remember on the same day then continue your regularly scheduled once daily regimen the next day. Do not take two doses of Xarelto at the same time.   Important Safety  Information Xarelto is a blood thinner medicine that can cause bleeding. You should call your healthcare provider right away if you experience any of the following: ? Bleeding from an injury or your nose that does not stop. ? Unusual colored urine (red or dark brown) or unusual colored stools (red or black). ? Unusual bruising for unknown reasons. ? A serious fall or if you hit your head (even if there is no bleeding).  Some medicines may interact with Xarelto and might increase your risk of bleeding while on Xarelto. To help avoid this, consult your healthcare provider or pharmacist prior to using any new prescription or non-prescription medications, including herbals, vitamins, non-steroidal anti-inflammatory drugs (NSAIDs) and supplements.  This website has more information on Xarelto: VisitDestination.com.br.

## 2016-03-25 NOTE — Progress Notes (Signed)
  Echocardiogram 2D Echocardiogram has been performed.  Victoria Alvarez 03/25/2016, 4:29 PM

## 2016-03-25 NOTE — H&P (Addendum)
History and Physical    [name] QVZ:[id number] DOB: 1990-12-15 DOA: 03/25/2016  Referring MD/NP/PA:   PCP: Minerva Ends, MD   Patient coming from:  The patient is coming from home.  At baseline, pt is independent for most of ADL.   Chief Complaint: Right-sided weakness and difficulty speaking  HPI: Victoria Alvarez is a 26 y.o. female with medical history significant of hypercoagulable status, portal vein thrombosis, cerebral venous sinus thrombosis, obesity, stroke, Crohn's disease, tobacco abuse, depression, asthma, seizure, who presents with right-sided weakness and difficulty speaking.  Pt states hat she has R shoulder pain that radiates down R arm. She then developed left-sided weakness and numbness, right arm is worse than the leg. She could not move her right arm. Per ED RN, pt also had difficulty speaking out. Patient also complains of a mild chest pain, which is located in the substernal area, 2 out of 10 in severity. She does not have shortness rest, cough, fever, chills, runny nose or sore throat.  Patient states that she has hx of chron's disease. She had large amount of rectal bleeding 4 days ago causing pt to wear pads to protect her clothing. She also had heavy menstrual period, therefore she stopped taking Xeralto 4 days ago. She states that her menstrual period has stopped yesterday morning. She does not have GI symptoms anymore. No nausea, vomiting, abdominal pain, diarrhea or bloody stool. Denies symptoms of UTI.   ED Course: pt was found to have hemoglobin 14.9, negative troponin, INR 1.00, negative pregnancy test, electrolytes renal function okay, temperature normal, no tachycardia, oxygen saturation 98% on room air.  MRI of brain is negative for acute intracranial abnormalities. MR venogram of brain is negative for acute issues. CTA of head and neck is negative for acute issues. Patient is placed on telemetry bed for observation. Neurology, Dr. Cheral Marker was  consulted.  Review of Systems:   General: no fevers, chills, no changes in body weight, has fatigue HEENT: no blurry vision, hearing changes or sore throat Respiratory: no dyspnea, coughing, wheezing CV: has chest pain, no palpitations GI: no nausea, vomiting, abdominal pain, diarrhea, constipation GU: no dysuria, burning on urination, increased urinary frequency, hematuria  Ext: no leg edema Neuro: has right sided weakness, numbness. Has difficult speaking out. No vision change or hearing loss Skin: no rash, no skin tear. MSK: No muscle spasm, no deformity, no limitation of range of movement in spin Heme: No easy bruising.  Travel history: No recent long distant travel.  Allergy:  Allergies  Allergen Reactions  . Bee Venom Swelling    Past Medical History:  Diagnosis Date  . Anemia 07/2014   Microcytic  . Asthma   . Cerebral venous sinus thrombosis   . Chronic pelvic pain in female   . Depression 2007  . Headache(784.0)   . Hypercoagulable state, primary ([place]) 07/2014   . no clear inherited or acquired cause for the patient's coagulopathy  . Interstitial cystitis    pain from this was reason for starting opioids age 58.   . Miscarriage 2005  . Morbid obesity ([place])    BMI 37 in 11/2014.  . Non-compliance    with anticoagulant  . Numbness    right side  . Opioid dependence ([place])    to Rx opioids.  switched to Methadone after second child born in 04/2014  . 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 Pneumonia 03/2014  . 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 Preeclampsia 2012  . Pulmonary embolism ([place])   .  Pyelonephritis affecting pregnancy in first trimester July 2015  . Seizures ([place])   . Stroke [facility]) 05/2014   presented with right sided weakness.     Past Surgical History:  Procedure Laterality Date  . ABDOMINAL SURGERY    . CESAREAN SECTION N/A 05/06/2014   Procedure: CESAREAN SECTION;  Surgeon: Truett Mainland, DO;  Location: [facility];  Service: Obstetrics;  Laterality: N/A;   . COLONOSCOPY WITH PROPOFOL N/A 11/18/2014   Procedure: COLONOSCOPY WITH PROPOFOL;  Surgeon: Milus Banister, MD;  Location: [place];  Service: Endoscopy;  Laterality: N/A;  . INGUINAL HERNIA REPAIR Left 2008  . VAGINA SURGERY  ~ 2011   benign vaginal tumor removed at [facility].     Social History:  reports that she has been smoking Cigarettes.  She has a 2.00 pack-year smoking history. She has never used smokeless tobacco. She reports that she does not drink alcohol or use drugs.  Family History:  Family History  Problem Relation Age of Onset  . Diabetes Mother   . Heart disease Mother   . Hypertension Father   . Diabetes Maternal Grandmother   . Hypertension Maternal Grandmother   . Stroke Maternal Grandmother   . Diabetes Maternal Grandfather   . Hypertension Maternal Grandfather   . Diabetes Paternal Grandmother   . Diabetes Paternal Grandfather      Prior to Admission medications   Medication Sig Start Date End Date Taking? Authorizing Provider  Oxycodone HCl 10 MG TABS Take 10 mg by mouth every 6 (six) hours.   Yes Historical Provider, MD  rivaroxaban (XARELTO) 20 MG TABS tablet Take 1 tablet (20 mg total) by mouth daily with supper. 04/03/15  Yes Liberty Handy, MD  ferrous sulfate 325 (65 FE) MG tablet Take 1 tablet (325 mg total) by mouth 3 (three) times daily with meals. Patient not taking: Reported on 03/25/2016 03/14/15   Liberty Handy, MD  levETIRAcetam (KEPPRA) 500 MG tablet Take 1 tablet (500 mg total) by mouth 2 (two) times daily. Patient not taking: Reported on 03/25/2016 06/13/14   Reyne Dumas, MD  methadone (DOLOPHINE) 5 MG tablet Take 17 tablets (85 mg total) by mouth daily. Home medication continued Patient not taking: Reported on 03/25/2016 10/16/14   Thurnell Lose, MD  ondansetron (ZOFRAN) 4 MG tablet Take 1 tablet (4 mg total) by mouth every 6 (six) hours as needed for nausea. Patient not taking: Reported on 03/25/2016 03/14/15   Liberty Handy, MD  polyethylene glycol  [facility] Raveling) packet Take 17 g by mouth daily. Patient not taking: Reported on 03/25/2016 03/14/15   Liberty Handy, MD  Rivaroxaban (XARELTO) 15 MG TABS tablet Take 1 tablet (15 mg total) by mouth 2 (two) times daily with a meal. Patient not taking: Reported on 03/25/2016 03/14/15   Liberty Handy, MD    Physical Exam: Vitals:   03/25/16 0330 03/25/16 0342 03/25/16 0400 03/25/16 0530  BP: 102/66  (!) 103/41 (!) 108/50  Pulse: (!) 53  (!) 51 (!) 46  Resp: 19  18 18   Temp:  97.9 F (36.6 C)  97.8 F (36.6 C)  TempSrc:    Oral  SpO2: 97%  98% 99%  Weight:    76.9 kg (169 lb 8.5 oz)  Height:    5' 2"  (1.575 m)   General: Not in acute distress HEENT:       Eyes: PERRL, EOMI, no scleral icterus.       ENT: No discharge from the ears and nose,  no pharynx injection, no tonsillar enlargement.        Neck: No JVD, no bruit, no mass felt. Heme: No neck lymph node enlargement. Cardiac: S1/S2, RRR, No murmurs, No gallops or rubs. Respiratory: No rales, wheezing, rhonchi or rubs. GI: Soft, nondistended, nontender, no rebound pain, no organomegaly, BS present. GU: No hematuria Ext: No pitting leg edema bilaterally. 2+DP/PT pulse bilaterally. Musculoskeletal: No joint deformities, No joint redness or warmth, no limitation of ROM in spin. Skin: No rashes.  Neuro: Alert, oriented X3, cranial nerves II-XII grossly intact. Muscle strength 0/5 in right arm and 5/5 in other extremities. Sensation to light touch is decreased in right arm and intact in other extremities. Negative Babinski's sign. Psych: Patient is not psychotic, no suicidal or hemocidal ideation.  Labs on Admission: I have personally reviewed following labs and imaging studies  CBC:  Recent Labs Lab 03/25/16 0037 03/25/16 0049  WBC 5.7  --   NEUTROABS 2.9  --   HGB 14.9 13.6  HCT 41.6 40.0  MCV 91.2  --   PLT 183  --    Basic Metabolic Panel:  Recent Labs Lab 03/25/16 0037 03/25/16 0049  NA 139 139  K 3.5 3.5  CL 109  106  CO2 23  --   GLUCOSE 97 103*  BUN 6 5*  CREATININE 0.71 0.80  CALCIUM 9.1  --    GFR: Estimated Creatinine Clearance: 102.3 mL/min (by C-G formula based on SCr of 0.8 mg/dL). Liver Function Tests:  Recent Labs Lab 03/25/16 0037  AST 18  ALT 20  ALKPHOS 51  BILITOT 1.9*  PROT 6.3*  ALBUMIN 3.8   No results for input(s): LIPASE, AMYLASE in the last 168 hours. No results for input(s): AMMONIA in the last 168 hours. Coagulation Profile:  Recent Labs Lab 03/25/16 0037  INR 1.00   Cardiac Enzymes:  Recent Labs Lab 03/25/16 0448  TROPONINI <0.03   BNP (last 3 results) No results for input(s): PROBNP in the last 8760 hours. HbA1C: No results for input(s): HGBA1C in the last 72 hours. CBG: No results for input(s): GLUCAP in the last 168 hours. Lipid Profile:  Recent Labs  03/25/16 0448  CHOL 194  HDL 44  LDLCALC 130*  TRIG 100  CHOLHDL 4.4   Thyroid Function Tests: No results for input(s): TSH, T4TOTAL, FREET4, T3FREE, THYROIDAB in the last 72 hours. Anemia Panel: No results for input(s): VITAMINB12, FOLATE, FERRITIN, TIBC, IRON, RETICCTPCT in the last 72 hours. Urine analysis:    Component Value Date/Time   COLORURINE YELLOW 06/21/2015 [place] 06/21/2015 1151   LABSPEC 1.011 06/21/2015 1151   PHURINE 5.5 06/21/2015 1151   GLUCOSEU NEGATIVE 06/21/2015 1151   HGBUR NEGATIVE 06/21/2015 1151   BILIRUBINUR NEGATIVE 06/21/2015 1151   KETONESUR NEGATIVE 06/21/2015 1151   PROTEINUR NEGATIVE 06/21/2015 1151   UROBILINOGEN 1.0 12/15/2014 2345   NITRITE NEGATIVE 06/21/2015 1151   LEUKOCYTESUR NEGATIVE 06/21/2015 1151   Sepsis Labs: @LABRCNTIP (procalcitonin:4,lacticidven:4) )No results found for this or any previous visit (from the past 240 hour(s)).   Radiological Exams on Admission: Ct Angio Head W Or Wo Contrast  Result Date: 03/25/2016 CLINICAL DATA:  Stroke.  Right-sided weakness. EXAM: CT ANGIOGRAPHY HEAD AND NECK TECHNIQUE:  Multidetector CT imaging of the head and neck was performed using the standard protocol during bolus administration of intravenous contrast. Multiplanar CT image reconstructions and MIPs were obtained to evaluate the vascular anatomy. Carotid stenosis measurements (when applicable) are obtained utilizing NASCET criteria, using  the distal internal carotid diameter as the denominator. CONTRAST:  50 mL Isovue 370 IV COMPARISON:  Head CT 03/25/2016 FINDINGS: CTA NECK FINDINGS Aortic arch: There is no aneurysm or dissection of the visualized ascending aorta or aortic arch. There is a normal 3 vessel branching pattern. The visualized proximal subclavian arteries are normal. Right carotid system: The right common carotid origin is widely patent. There is no common carotid or internal carotid artery dissection or aneurysm. No hemodynamically significant stenosis. Left carotid system: The left common carotid origin is widely patent. There is no common carotid or internal carotid artery dissection or aneurysm. No hemodynamically significant stenosis. Vertebral arteries: The vertebral system is codominant. Both vertebral artery origins are normal. Both vertebral arteries are normal to their confluence with the basilar artery. Skeleton: There is no bony spinal canal stenosis. No lytic or blastic lesions. Other neck: The nasopharynx is clear. The oropharynx and hypopharynx are normal. The epiglottis is normal. The supraglottic larynx, glottis and subglottic larynx are normal. No retropharyngeal collection. The parapharyngeal spaces are preserved. The parotid and submandibular glands are normal. No sialolithiasis or salivary ductal dilatation. The thyroid gland is normal. There is no cervical lymphadenopathy. Upper chest: No pneumothorax or pleural effusion. No nodules or masses. Review of the MIP images confirms the above findings CTA HEAD FINDINGS Anterior circulation: --Intracranial internal carotid arteries: Normal.  --Anterior cerebral arteries: Normal. --Middle cerebral arteries: Normal. --Posterior communicating arteries: Absent bilaterally. Posterior circulation: --Posterior cerebral arteries: Normal. --Superior cerebellar arteries: Normal. --Basilar artery: Normal. --Anterior inferior cerebellar arteries: Normal. --Posterior inferior cerebellar arteries: Normal. Venous sinuses: As permitted by contrast timing, patent. Anatomic variants: None Delayed phase: Not performed. Review of the MIP images confirms the above findings IMPRESSION: Normal CTA of the head and neck. Electronically Signed   By: Ulyses Jarred M.D.   On: 03/25/2016 01:43   Dg Chest 2 View  Result Date: 03/25/2016 CLINICAL DATA:  Left-sided chest pain EXAM: CHEST  2 VIEW COMPARISON:  Chest CT 03/12/2015 FINDINGS: Shallow lung inflation. Cardiomediastinal contours are normal. No focal airspace consolidation or pulmonary edema. No pneumothorax or sizable pleural effusion. IMPRESSION: No active cardiopulmonary disease. Electronically Signed   By: Ulyses Jarred M.D.   On: 03/25/2016 04:51   Ct Angio Neck W Or Wo Contrast  Result Date: 03/25/2016 CLINICAL DATA:  Stroke.  Right-sided weakness. EXAM: CT ANGIOGRAPHY HEAD AND NECK TECHNIQUE: Multidetector CT imaging of the head and neck was performed using the standard protocol during bolus administration of intravenous contrast. Multiplanar CT image reconstructions and MIPs were obtained to evaluate the vascular anatomy. Carotid stenosis measurements (when applicable) are obtained utilizing NASCET criteria, using the distal internal carotid diameter as the denominator. CONTRAST:  50 mL Isovue 370 IV COMPARISON:  Head CT 03/25/2016 FINDINGS: CTA NECK FINDINGS Aortic arch: There is no aneurysm or dissection of the visualized ascending aorta or aortic arch. There is a normal 3 vessel branching pattern. The visualized proximal subclavian arteries are normal. Right carotid system: The right common carotid origin is  widely patent. There is no common carotid or internal carotid artery dissection or aneurysm. No hemodynamically significant stenosis. Left carotid system: The left common carotid origin is widely patent. There is no common carotid or internal carotid artery dissection or aneurysm. No hemodynamically significant stenosis. Vertebral arteries: The vertebral system is codominant. Both vertebral artery origins are normal. Both vertebral arteries are normal to their confluence with the basilar artery. Skeleton: There is no bony spinal canal stenosis. No lytic or blastic  lesions. Other neck: The nasopharynx is clear. The oropharynx and hypopharynx are normal. The epiglottis is normal. The supraglottic larynx, glottis and subglottic larynx are normal. No retropharyngeal collection. The parapharyngeal spaces are preserved. The parotid and submandibular glands are normal. No sialolithiasis or salivary ductal dilatation. The thyroid gland is normal. There is no cervical lymphadenopathy. Upper chest: No pneumothorax or pleural effusion. No nodules or masses. Review of the MIP images confirms the above findings CTA HEAD FINDINGS Anterior circulation: --Intracranial internal carotid arteries: Normal. --Anterior cerebral arteries: Normal. --Middle cerebral arteries: Normal. --Posterior communicating arteries: Absent bilaterally. Posterior circulation: --Posterior cerebral arteries: Normal. --Superior cerebellar arteries: Normal. --Basilar artery: Normal. --Anterior inferior cerebellar arteries: Normal. --Posterior inferior cerebellar arteries: Normal. Venous sinuses: As permitted by contrast timing, patent. Anatomic variants: None Delayed phase: Not performed. Review of the MIP images confirms the above findings IMPRESSION: Normal CTA of the head and neck. Electronically Signed   By: Ulyses Jarred M.D.   On: 03/25/2016 01:43   Mr Brain Wo Contrast  Result Date: 03/25/2016 CLINICAL DATA:  Right shoulder pain radiating into  right arm. History of dural venous sinus thrombosis. EXAM: MRI HEAD WITHOUT CONTRAST MRV HEAD WITHOUT CONTRAST TECHNIQUE: Multiplanar, multiecho pulse sequences of the brain and surrounding structures were obtained without intravenous contrast. Angiographic images of the intracranial venous structures were obtained using MRV technique without intravenous contrast. COMPARISON:  Brain MRI 08/05/2014 FINDINGS: Brain: No focal diffusion restriction to indicate acute infarct. No intraparenchymal hemorrhage. The brain parenchymal signal is normal. No mass lesion or midline shift. No hydrocephalus or extra-axial fluid collection. The midline structures are normal. No age advanced or lobar predominant atrophy. Vascular: Major intracranial arterial and venous sinus flow voids are preserved. Unchanged left parietal focus of chronic microhemorrhage. No cortical vein thrombosis identified. -MRV:  The MRV portion of the study is degraded by motion. --Superior sagittal sinus: Within the aspect of the superior sagittal sinus near the skull vertex, there is an area of absent flow related enhancement that is unchanged compared to the study of 08/05/2014. This corresponds to a filling defect within the superior sagittal sinus on the concomitant CTA. The remainder of the superior sagittal sinus is normal. --Straight sinus: Normal. --Inferior sagittal sinus, vein of Galen and internal cerebral veins: Normal. --Transverse sinuses: Normal. --Sigmoid sinuses: Normal. --Visualized jugular veins: Normal. Skull and upper cervical spine: The visualized skull base, calvarium, upper cervical spine and extracranial soft tissues are normal. Sinuses/Orbits: No fluid levels or advanced mucosal thickening. No mastoid effusion. Normal orbits. IMPRESSION: 1. No acute intracranial abnormality. 2. Area of absent flow related enhancement within the posterior vertex area of the superior sagittal sinus is unchanged compared to 08/05/2014. This may simply  be a arachnoid granulation, though residual thrombus remains a possibility. There is no increase or new evidence of venous sinus thrombosis. 3. For future evaluation for potential dural venous sinus thrombosis, CT venography is superior to MR venography. Electronically Signed   By: Ulyses Jarred M.D.   On: 03/25/2016 02:57   Mr Annell Greening Head  Result Date: 03/25/2016 CLINICAL DATA:  Right shoulder pain radiating into right arm. History of dural venous sinus thrombosis. EXAM: MRI HEAD WITHOUT CONTRAST MRV HEAD WITHOUT CONTRAST TECHNIQUE: Multiplanar, multiecho pulse sequences of the brain and surrounding structures were obtained without intravenous contrast. Angiographic images of the intracranial venous structures were obtained using MRV technique without intravenous contrast. COMPARISON:  Brain MRI 08/05/2014 FINDINGS: Brain: No focal diffusion restriction to indicate acute infarct. No intraparenchymal hemorrhage. The  brain parenchymal signal is normal. No mass lesion or midline shift. No hydrocephalus or extra-axial fluid collection. The midline structures are normal. No age advanced or lobar predominant atrophy. Vascular: Major intracranial arterial and venous sinus flow voids are preserved. Unchanged left parietal focus of chronic microhemorrhage. No cortical vein thrombosis identified. -MRV:  The MRV portion of the study is degraded by motion. --Superior sagittal sinus: Within the aspect of the superior sagittal sinus near the skull vertex, there is an area of absent flow related enhancement that is unchanged compared to the study of 08/05/2014. This corresponds to a filling defect within the superior sagittal sinus on the concomitant CTA. The remainder of the superior sagittal sinus is normal. --Straight sinus: Normal. --Inferior sagittal sinus, vein of Galen and internal cerebral veins: Normal. --Transverse sinuses: Normal. --Sigmoid sinuses: Normal. --Visualized jugular veins: Normal. Skull and upper  cervical spine: The visualized skull base, calvarium, upper cervical spine and extracranial soft tissues are normal. Sinuses/Orbits: No fluid levels or advanced mucosal thickening. No mastoid effusion. Normal orbits. IMPRESSION: 1. No acute intracranial abnormality. 2. Area of absent flow related enhancement within the posterior vertex area of the superior sagittal sinus is unchanged compared to 08/05/2014. This may simply be a arachnoid granulation, though residual thrombus remains a possibility. There is no increase or new evidence of venous sinus thrombosis. 3. For future evaluation for potential dural venous sinus thrombosis, CT venography is superior to MR venography. Electronically Signed   By: Ulyses Jarred M.D.   On: 03/25/2016 02:57   Ct Head Code Stroke W/o Cm  Result Date: 03/25/2016 CLINICAL DATA:  Code stroke.  Right-sided weakness EXAM: CT HEAD WITHOUT CONTRAST TECHNIQUE: Contiguous axial images were obtained from the base of the skull through the vertex without intravenous contrast. COMPARISON:  None. FINDINGS: Brain: No mass lesion, intraparenchymal hemorrhage or extra-axial collection. No evidence of acute cortical infarct. Brain parenchyma and CSF-containing spaces are normal for age. Vascular: No hyperdense vessel or unexpected calcification. Skull: Normal visualized skull base, calvarium and extracranial soft tissues. Sinuses/Orbits: No sinus fluid levels or advanced mucosal thickening. No mastoid effusion. Normal orbits. ASPECTS [facility] Stroke Program Early CT Score) - Ganglionic level infarction (caudate, lentiform nuclei, internal capsule, insula, M1-M3 cortex): 7 - Supraganglionic infarction (M4-M6 cortex): 3 Total score (0-10 with 10 being normal): 10 IMPRESSION: 1. Normal head CT. 2. ASPECTS is 10. These results were called by telephone at the time of interpretation on 03/25/2016 at 1:02 am to Dr. Kerney Elbe, who verbally acknowledged these results. Electronically Signed   By: Ulyses Jarred M.D.   On: 03/25/2016 01:04     EKG: Independently reviewed.  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  MRI of brain is negative for acute intracranial abnormalities. MR venogram of brain is negative for acute issues. CTA of head and neck is negative for acute issues. Pt may need MRI of neck to r/o spinal cord compression. Neurology, Dr. Cheral Marker was consulted.   - placed on telemetry bed for observation.  - will f/u neurologist recommendation - fasting lipid panel and HbA1c  - Check UDS  - PT/OT  consult  Hypercoagulable state/thrombosis of superior sagittal sinus/Cerebral venous sinus thrombosis/ Pulmonary embolism/Portal vein thrombosis: pt stopped xarelto 4 days ago. She does not have more GIB. Her menstrual period has need. -will resume her Xarelto.  Tobacco abuse: -Did counseling about importance of quitting smoking -Nicotine patch  Hx of Stroke (cerebrum) ([place]): -on Xarelto as above  Chest pain: pt has mild CP, 2/10 in severity. Etiology is not clear. Possibly related to PE.  -resumed Xarelto -check trop x 3 -check A1 and FLP. -f/u CXR  Hx of Crohn's disease: currently asymptomatic. -Check FOBT  DVT ppx: on Xarelto Code Status: Full code Family Communication: None at bed side.   Disposition Plan:  Anticipate discharge back to previous home environment Consults called:  Urology, Dr. Cheral Marker was consulted. Admission status: Obs / tele     Date of Service 03/25/2016    Ivor Costa [facility] Pager [phone]  If 7PM-7AM, please contact night-coverage [email or web address] Password  [facility] 03/25/2016, 6:41 AM

## 2016-03-25 NOTE — ED Notes (Signed)
Pt back from MRI, called pt husband to give update per pt request

## 2016-03-25 NOTE — Progress Notes (Signed)
Pt admitted after midnight, please see admission note by Dr. Clyde Lundborg. Pt admitted with right sided weakness. MRI with no evidence of stroke, neuro consulted. PT eval pending.   Debbora Presto, MD  [facility] Pager [phone]  If 7PM-7AM, please contact night-coverage [email or web address] Password [email or web address]

## 2016-03-25 NOTE — Evaluation (Signed)
Occupational Therapy Evaluation Patient Details Name: Victoria Alvarez MRN: [id number] DOB: 1990/04/21 Today's Date: 03/25/2016    History of Present Illness Pt is a 26 y/o female with a PMH significant for prior stroke, who presents with R side UE weakness, N/T, and R foot N/T. MRI negative for acute findings.    Clinical Impression   PTA, pt was independent with ADL and functional mobility. 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 She presents with decreased functional use of R UE with tingling and pins and needles sensation with all passive movement and light touch. Pt only able to slightly move R digits and no AROM R wrist, elbow, and shoulder. PROM RUE is WFL. She requires moderate assistance with all ADL. Pt would benefit from continued OT services while admitted to improve independence with ADL and functional use of R UE. Recommend CIR placement for continued OT services in order to maximize independence with ADL and return to PLOF. OT will continue to follow acutely.     Follow Up Recommendations  CIR    Equipment Recommendations  Other (comment) (TBD at next venue of care)    Recommendations for Other Services Rehab consult     Precautions / Restrictions Precautions Precautions: Fall Restrictions Weight Bearing Restrictions: No      Mobility Bed Mobility Overal bed mobility: Needs Assistance Bed Mobility: Supine to Sit     Supine to sit: Supervision     General bed mobility comments: HOB elevated. Heavy use of rails. Pt was able to achieve EOB with increased effort, but no assist from therapist.   Transfers Overall transfer level: Needs assistance Equipment used: 2 person hand held assist Transfers: Sit to/from Stand Sit to Stand: Min assist;+2 safety/equipment         General transfer comment: Steadying assist as pt powered-up to full standing position. Pt required assist to support RUE.    Balance Overall balance assessment: Needs  assistance Sitting-balance support: Single extremity supported Sitting balance-Leahy Scale: Fair     Standing balance support: No upper extremity supported;During functional activity Standing balance-Leahy Scale: Fair                              ADL Overall ADL's : Needs assistance/impaired Eating/Feeding: Set up;Sitting;Supervision/ safety   Grooming: Minimal assistance;Sitting   Upper Body Bathing: Moderate assistance;Sitting   Lower Body Bathing: Sit to/from stand;Moderate assistance   Upper Body Dressing : Moderate assistance;Sitting   Lower Body Dressing: Moderate assistance;Sit to/from stand   Toilet Transfer: Minimal assistance;Moderate assistance;Ambulation;Regular Toilet;+2 for safety/equipment   Toileting- Clothing Manipulation and Hygiene: Minimal assistance;Sit to/from stand       Functional mobility during ADLs: Minimal assistance;Moderate assistance;+2 for safety/equipment General ADL Comments: Min assist for initial sit<>stand with mod assist for ambulation for safety. Pt educated on positioning of R UE for safety.     Vision Vision Assessment?: No apparent visual deficits   Perception     Praxis      Pertinent Vitals/Pain Pain Assessment: 0-10 Pain Score: 5  Pain Location: proximal to elbow Pain Descriptors / Indicators: Pins and needles;Tingling Pain Intervention(s): Limited activity within patient's tolerance;Repositioned;Monitored during session     Hand Dominance Right   Extremity/Trunk Assessment Upper Extremity Assessment Upper Extremity Assessment: RUE deficits/detail RUE Deficits / Details: Only able to initiate movement of R digits. No AROM R wrist, elbow, shoulder, or scapula. PROM WNL. Tingling and pins and  needles when touched.   Lower Extremity Assessment Lower Extremity Assessment: Defer to PT evaluation RLE Deficits / Details: Pt reports feeling of N/T going down her leg when up and attempting to walk. In sitting,  only lateral foot was reported to have decreased sensation, N/T. Strength grossly 4+/5 in RLE.    Cervical / Trunk Assessment Cervical / Trunk Assessment: Normal   Communication Communication Communication: No difficulties   Cognition Arousal/Alertness: Awake/alert Behavior During Therapy: WFL for tasks assessed/performed;Anxious Overall Cognitive Status: Within Functional Limits for tasks assessed                 General Comments: Pt tearful throughout due to frustration with condition.   General Comments       Exercises       Shoulder Instructions      Home Living Family/patient expects to be discharged to:: Private residence Living Arrangements: Spouse/significant other Available Help at Discharge: Family;Neighbor;Available PRN/intermittently Type of Home: Mobile home Home Access: Stairs to enter Entrance Stairs-Number of Steps: 4 Entrance Stairs-Rails: Right;Left;Can reach both Home Layout: One level     Bathroom Shower/Tub: Chief Strategy Officer: Standard Bathroom Accessibility: Yes   Home Equipment: Shower seat;Hand held shower head          Prior Functioning/Environment Level of Independence: Independent        Comments: 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        OT Problem List: Decreased strength;Decreased range of motion;Decreased activity tolerance;Impaired balance (sitting and/or standing);Decreased safety awareness;Decreased knowledge of use of DME or AE;Decreased knowledge of precautions;Pain;Impaired UE functional use   OT Treatment/Interventions: Self-care/ADL training;Therapeutic exercise;Energy conservation;DME and/or AE instruction;Therapeutic activities;Patient/family education;Balance training    OT Goals(Current goals can be found in the care plan section) Acute Rehab OT Goals Patient Stated Goal: to get back to taking care of her kids OT Goal Formulation: With patient Time For Goal Achievement:  04/01/16 Potential to Achieve Goals: Good ADL Goals Pt Will Perform Grooming: standing;with supervision Pt Will Perform Upper Body Dressing: with supervision;sitting Pt Will Perform Lower Body Dressing: with supervision;sit to/from stand Pt Will Transfer to Toilet: with supervision;ambulating Pt Will Perform Toileting - Clothing Manipulation and hygiene: with supervision;sit to/from stand Pt Will Perform Tub/Shower Transfer: Tub transfer;with supervision;tub bench;ambulating Pt/caregiver will Perform Home Exercise Program: Right Upper extremity;Increased ROM;Increased strength;Independently;With written HEP provided  OT Frequency: Min 3X/week   Barriers to D/C:            Co-evaluation PT/OT/SLP Co-Evaluation/Treatment: Yes Reason for Co-Treatment: To address functional/ADL transfers PT goals addressed during session: Mobility/safety with mobility;Balance OT goals addressed during session: ADL's and self-care      End of Session Equipment Utilized During Treatment: Gait belt Nurse Communication: Mobility status  Activity Tolerance: Patient tolerated treatment well Patient left: in chair;with chair alarm set;with call bell/phone within reach   Time: 1045-1111 OT Time Calculation (min): 26 min Charges:  OT General Charges $OT Visit: 1 Procedure OT Evaluation $OT Eval Moderate Complexity: 1 Procedure G-Codes: OT G-codes **NOT FOR INPATIENT CLASS** Functional Assessment Tool Used: clinical judgement Functional Limitation: Self care Self Care Current Status (O9629): At least 40 percent but less than 60 percent impaired, limited or restricted Self Care Goal Status (B2841): At least 1 percent but less than 20 percent impaired, limited or restricted  Doristine Section, OTR/L [phone] 03/25/2016, 1:03 PM

## 2016-03-25 NOTE — Progress Notes (Signed)
Victoria Alvarez is a 26 yo female comes into ED via [facility] for new onset Right shoulder pain radiating into her Right chest. Pt then developed numbness to Right arm and leg. Pt has a hx of stroke 2016 with initially Right "foot drop" that has since cleared up. Pt also with hx of chron's disease with a significant amount of rectal bleeding 4 days ago causing pt to wear pads to protect her clothing. Pt also stopped taking her Xeralto 5 days ago due to time of menstrual cycle, pt reports she has heavy cycles. Pt was not a candidate for TPA due to recent rectal bleeding. NIHSS 6, CBG 103, LKW 2339. Pt to be admitted to Hospitalist for further evaluation.

## 2016-03-25 NOTE — Care Management Note (Signed)
Case Management Note  Patient Details  Name: Victoria Alvarez MRN: [id number] Date of Birth: 06/26/1990  Subjective/Objective:         Right-sided weakness and difficulty speaking.  Stroke work-up underway. CM will follow for discharge needs pending patient's progress and physician orders.           Action/Plan:   Expected Discharge Date:                  Expected Discharge Plan:     In-House Referral:     Discharge planning Services     Post Acute Care Choice:    Choice offered to:     DME Arranged:    DME Agency:     HH Arranged:    HH Agency:     Status of Service:     If discussed at [employer] of Stay Meetings, dates discussed:    Additional Comments:  Anda Kraft, RN 03/25/2016, 11:21 AM

## 2016-03-25 NOTE — ED Triage Notes (Signed)
Pt comes from [facility], c/o R shoulder pain that radiates down R arm, positive PMS, hx of blood clots and stroke in the past, R handed grip weak, weakness started about an hour ago.  PTA pt took one nitro that was not prescribed to her, pt also c/o CP

## 2016-03-25 NOTE — Evaluation (Signed)
Physical Therapy Evaluation Patient Details Name: Victoria Alvarez MRN: [id number] DOB: 1990-12-08 Today's Date: 03/25/2016   History of Present Illness  Pt is a 26 y/o female with a PMH significant for prior stroke, who presents with R side UE weakness, N/T, and R foot N/T. MRI negative for acute findings.   Clinical Impression  Pt admitted with above diagnosis. Pt currently with functional limitations due to the deficits listed below (see PT Problem List). At the time of PT eval pt required assistance for transfers OOB and ambulation. She has decreased strength and AROM with sensory changes in the R side (UE>LE), which is limiting her ability to perform functional mobility independently. She looking to get back to caring for her children as soon as possible. Given young age and neuro symptoms, recommending CIR consult if pt class is updated to inpatient status. Pt will benefit from skilled PT to increase their independence and safety with mobility to allow discharge to the venue listed below.       Follow Up Recommendations CIR;Supervision for mobility/OOB    Equipment Recommendations  None recommended by PT    Recommendations for Other Services       Precautions / Restrictions Precautions Precautions: Fall Restrictions Weight Bearing Restrictions: No      Mobility  Bed Mobility Overal bed mobility: Needs Assistance Bed Mobility: Supine to Sit     Supine to sit: Supervision     General bed mobility comments: HOB elevated. Heavy use of rails. Pt was able to achieve EOB with increased effort, but no assist from therapist.   Transfers Overall transfer level: Needs assistance Equipment used: 2 person hand held assist Transfers: Sit to/from Stand Sit to Stand: Min assist;+2 safety/equipment         General transfer comment: Steadying assist as pt powered-up to full standing position. Pt required assist to support RUE.  Ambulation/Gait Ambulation/Gait assistance: Min  assist;+2 safety/equipment Ambulation Distance (Feet): 25 Feet Assistive device: 1 person hand held assist Gait Pattern/deviations: Step-through pattern;Decreased stride length;Shuffle;Trunk flexed;Narrow base of support Gait velocity: Decreased Gait velocity interpretation: Below normal speed for age/gender General Gait Details: Slow and guarded gait pattern. Pt required assist to support RUE during OOB mobility. Pt reports increased pain (pins and needles) shooting down her RLE during weight bearing.   Stairs            Wheelchair Mobility    Modified Rankin (Stroke Patients Only) Modified Rankin (Stroke Patients Only) Pre-Morbid Rankin Score: No symptoms Modified Rankin: Moderately severe disability     Balance Overall balance assessment: Needs assistance Sitting-balance support: Single extremity supported Sitting balance-Leahy Scale: Fair     Standing balance support: No upper extremity supported;During functional activity Standing balance-Leahy Scale: Fair                               Pertinent Vitals/Pain Pain Assessment: 0-10 Pain Score: 5  Pain Location: proximal to elbow Pain Descriptors / Indicators: Pins and needles;Tingling Pain Intervention(s): Limited activity within patient's tolerance;Repositioned;Monitored during session    Home Living Family/patient expects to be discharged to:: Private residence Living Arrangements: Spouse/significant other Available Help at Discharge: Family;Neighbor;Available PRN/intermittently Type of Home: Mobile home Home Access: Stairs to enter Entrance Stairs-Rails: Right;Left;Can reach both Entrance Stairs-Number of Steps: 4 Home Layout: One level Home Equipment: Shower seat;Hand held shower head      Prior Function Level of Independence: Independent  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 No AROM R wrist, elbow, shoulder, or scapula. PROM WNL. Tingling and pins and needles when touched.    Lower Extremity Assessment Lower Extremity Assessment: Defer to PT evaluation RLE Deficits / Details: Pt reports feeling of N/T going down her leg when up and attempting to walk. In sitting, only lateral foot was reported to have decreased sensation, N/T. Strength grossly 4+/5 in RLE.     Cervical / Trunk Assessment Cervical / Trunk Assessment: Normal  Communication   Communication: No difficulties  Cognition Arousal/Alertness: Awake/alert Behavior During Therapy: WFL for tasks assessed/performed;Anxious Overall Cognitive Status: Within Functional Limits for tasks assessed                 General Comments: Pt tearful throughout due to frustration with condition.    General Comments      Exercises     Assessment/Plan    PT Assessment Patient needs continued PT services  PT Problem List Decreased strength;Decreased range of motion;Decreased activity tolerance;Decreased balance;Decreased mobility;Decreased coordination;Decreased knowledge of use of DME;Decreased safety awareness;Decreased knowledge of precautions;Pain;Impaired sensation          PT Treatment Interventions DME instruction;Gait training;Stair training;Functional mobility training;Therapeutic activities;Therapeutic exercise;Neuromuscular re-education;Patient/family education    PT Goals (Current goals can be found in the Care Plan section)  Acute Rehab PT Goals Patient Stated Goal: to get back to taking care of her kids PT Goal Formulation: With patient Time For Goal Achievement: 04/01/16 Potential to Achieve Goals: Good    Frequency Min 4X/week   Barriers to discharge        Co-evaluation PT/OT/SLP Co-Evaluation/Treatment: Yes Reason for Co-Treatment: To address  functional/ADL transfers PT goals addressed during session: Mobility/safety with mobility;Balance OT goals addressed during session: ADL's and self-care       End of Session Equipment Utilized During Treatment: Gait belt Activity Tolerance: Patient limited by pain Patient left: in chair;with call bell/phone within reach;with chair alarm set Nurse Communication: Mobility status    Functional Assessment Tool Used: Clinical judgement Functional Limitation: Mobility: Walking and moving around Mobility: Walking and Moving Around Current Status (Z6109): At least 20 percent but less than 40 percent impaired, limited or restricted Mobility: Walking and Moving Around Goal Status [phone]): At least 1 percent but less than 20 percent impaired, limited or restricted    Time: 1045-1110 PT Time Calculation (min) (ACUTE ONLY): 25 min   Charges:   PT Evaluation $PT Eval Moderate Complexity: 1 Procedure PT Treatments $Gait Training: 8-22 mins   PT G Codes:   PT G-Codes **NOT FOR INPATIENT CLASS** Functional Assessment Tool Used: Clinical judgement Functional Limitation: Mobility: Walking and moving around Mobility: Walking and Moving Around Current Status (U9811): At least 20 percent but less than 40 percent impaired, limited or restricted Mobility: Walking and Moving Around Goal Status [phone]): At least 1 percent but less than 20 percent impaired, limited or restricted    Marylynn Pearson 03/25/2016, 12:40 PM   Conni Slipper, PT, DPT Acute Rehabilitation Services Pager: [phone]

## 2016-03-25 NOTE — Progress Notes (Signed)
ANTICOAGULATION CONSULT NOTE - Initial Consult  Pharmacy Consult for Xarelto Indication: hx VTE  Allergies  Allergen Reactions  . Bee Venom Swelling    Patient Measurements: Height: 5\' 2"  (157.5 cm) Weight: 170 lb (77.1 kg) IBW/kg (Calculated) : 50.1  Vital Signs: Temp: 98.2 F (36.8 C) (01/15 0029) Temp Source: Oral (01/15 0029) BP: 102/66 (01/15 0330) Pulse Rate: 53 (01/15 0330)  Labs:  Recent Labs  03/25/16 0037 03/25/16 0049  HGB 14.9 13.6  HCT 41.6 40.0  PLT 183  --   APTT 31  --   LABPROT 13.2  --   INR 1.00  --   CREATININE 0.71 0.80    Estimated Creatinine Clearance: 102.5 mL/min (by C-G formula based on SCr of 0.8 mg/dL).   Medical History: Past Medical History:  Diagnosis Date  . Anemia 07/2014   Microcytic  . Asthma   . Cerebral venous sinus thrombosis   . Chronic pelvic pain in female   . Depression 2007  . Headache(784.0)   . Hypercoagulable state, primary (HCC) 07/2014   . no clear inherited or acquired cause for the patient's coagulopathy  . Interstitial cystitis    pain from this was reason for starting opioids age 45.   . Miscarriage 2005  . Morbid obesity (HCC)    BMI 37 in 11/2014.  . Non-compliance    with anticoagulant  . Numbness    right side  . Opioid dependence (HCC)    to Rx opioids.  switched to Methadone after second child born in 04/2014  . 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 Pneumonia 03/2014  . 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 Preeclampsia 2012  . Pulmonary embolism (HCC)   . Pyelonephritis affecting pregnancy in first trimester July 2015  . Seizures (HCC)   . Stroke [facility]) 05/2014   presented with right sided weakness.     Medications:   (Not in a hospital admission)  Assessment: 26 yo F on Xarelto PTA for hx VTE (2016).  Pt stopped Xarelto 5 days ago due to onset of menstrual cycle.  Given this is not an acute DVT, will restart at previous dose.  Goal of Therapy:  therapeutic anticoagulation Monitor platelets  by anticoagulation protocol: Yes   Plan:  Xarelto 20mg  daily with supper  [employer], Pharm.D., BCPS Clinical Pharmacist Pager [phone] 03/25/2016 4:20 AM

## 2016-03-25 NOTE — ED Provider Notes (Signed)
[facility]-EMERGENCY DEPT Provider Note   CSN: [id number] Arrival date & time: 03/25/16  0021  By signing my name below, I, Alyssa Grove, attest that this documentation has been prepared under the direction and in the presence of Zadie Rhine, MD. Electronically Signed: Alyssa Grove, ED Scribe. 03/25/16. 1:21 AM.   History   Chief Complaint Chief Complaint  Patient presents with  . Extremity Weakness  . Chest Pain    LEVEL 5 CAVEAT: HPI and ROS limited due to acuity of condition  HPI Comments: Justin Buechner is a 26 y.o. female with PMHx of Stroke who presents to the Emergency Department complaining of gradual onset, constant moderate right shoulder pain beginning one hour PTA. Pain radiates down the right arm. Positive PMS. Right handed grip weakness started approximately 1 hour PTA. She has associated chest pain and took a nitro with no relief to symptoms.   The history is provided by the patient. No language interpreter was used.  Extremity Weakness  This is a new problem. The current episode started 1 to 2 hours ago. The problem occurs constantly. The problem has not changed since onset.Associated symptoms include chest pain.   Past Medical History:  Diagnosis Date  . Anemia 07/2014   Microcytic  . Asthma   . Cerebral venous sinus thrombosis   . Chronic pelvic pain in female   . Depression 2007  . Headache(784.0)   . Hypercoagulable state, primary (HCC) 07/2014   . no clear inherited or acquired cause for the patient's coagulopathy  . Interstitial cystitis    pain from this was reason for starting opioids age 89.   . Miscarriage 2005  . Morbid obesity (HCC)    BMI 37 in 11/2014.  . Non-compliance    with anticoagulant  . Numbness    right side  . Opioid dependence (HCC)    to Rx opioids.  switched to Methadone after second child born in 04/2014  . 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 Pneumonia 03/2014  . 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 Preeclampsia 2012  .  Pyelonephritis affecting pregnancy in first trimester July 2015  . Seizures (HCC)   . Stroke [facility]) 05/2014   presented with right sided weakness.     Patient Active Problem List   Diagnosis Date Noted  . Pulmonary embolism (HCC) 03/12/2015  . Hypercoagulable state (HCC)   . Palliative care encounter   . Stricture of small intestine   . Generalized abdominal pain   . Ileitis   . Lactic acidosis 11/14/2014  . Slow transit constipation   . Depression 11/03/2014  . Cerebral venous sinus thrombosis   . AP (abdominal pain)   . Portal vein thrombosis   . Pneumatosis of intestines 10/08/2014  . Chronic diarrhea 07/12/2014  . 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 Headache 06/16/2014  . Bee sting allergy 06/16/2014  . Cerebral vein thrombosis 06/12/2014  . Cerebral venous infarction, acute (HCC)   . Seizure (HCC) 06/11/2014  . Anemia, iron deficiency   . Asthma with acute exacerbation   . Thrombosis, superior sagittal sinus   . 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 ABDOMINAL SURGERY    . CESAREAN SECTION N/A 05/06/2014   Procedure: CESAREAN SECTION;  Surgeon: Levie Heritage, DO;  Location: [facility] ORS;  Service: Obstetrics;  Laterality: N/A;  . COLONOSCOPY WITH PROPOFOL N/A 11/18/2014   Procedure: COLONOSCOPY WITH PROPOFOL;  Surgeon: Reuel Boom  Marye Round, MD;  Location: [facility] ENDOSCOPY;  Service: Endoscopy;  Laterality: N/A;  . INGUINAL HERNIA REPAIR Left 2008  . VAGINA SURGERY  ~ 2011   benign vaginal tumor removed at [facility].     OB History    Gravida Para Term Preterm AB Living   3 2 2  0 1 2   SAB TAB Ectopic Multiple Live Births   1 0 0 0 2       Home Medications    Prior to Admission medications   Medication Sig Start Date End Date Taking? Authorizing Provider  ferrous sulfate 325 (65 FE) MG tablet Take 1 tablet (325 mg total) by mouth 3 (three) times daily with meals. 03/14/15    Ruben Im, MD  levETIRAcetam (KEPPRA) 500 MG tablet Take 1 tablet (500 mg total) by mouth 2 (two) times daily. Patient not taking: Reported on 06/21/2015 06/13/14   Richarda Overlie, MD  methadone (DOLOPHINE) 5 MG tablet Take 17 tablets (85 mg total) by mouth daily. Home medication continued Patient not taking: Reported on 06/21/2015 10/16/14   Leroy Sea, MD  ondansetron (ZOFRAN) 4 MG tablet Take 1 tablet (4 mg total) by mouth every 6 (six) hours as needed for nausea. Patient not taking: Reported on 06/21/2015 03/14/15   Ruben Im, MD  Oxycodone HCl 10 MG TABS Take 10 mg by mouth every 6 (six) hours.    Historical Provider, MD  polyethylene glycol (MIRALAX / GLYCOLAX) packet Take 17 g by mouth daily. Patient not taking: Reported on 06/21/2015 03/14/15   Ruben Im, MD  Rivaroxaban (XARELTO) 15 MG TABS tablet Take 1 tablet (15 mg total) by mouth 2 (two) times daily with a meal. Patient not taking: Reported on 06/21/2015 03/14/15   Ruben Im, MD  rivaroxaban (XARELTO) 20 MG TABS tablet Take 1 tablet (20 mg total) by mouth daily with supper. 04/03/15   Ruben Im, MD    Family History Family History  Problem Relation Age of Onset  . Diabetes Mother   . Heart disease Mother   . Hypertension Father   . Diabetes Maternal Grandmother   . Hypertension Maternal Grandmother   . Stroke Maternal Grandmother   . Diabetes Maternal Grandfather   . Hypertension Maternal Grandfather   . Diabetes Paternal Grandmother   . Diabetes Paternal Grandfather     Social History Social History  Substance Use Topics  . Smoking status: Current Every Day Smoker    Packs/day: 0.25    Years: 8.00    Types: Cigarettes  . Smokeless tobacco: Never Used     Comment: cutting back  . Alcohol use No     Allergies   Bee venom   Review of Systems Review of Systems  Unable to perform ROS: Acuity of condition  Cardiovascular: Positive for chest pain.  Musculoskeletal: Positive for extremity weakness.    Neurological: Positive for weakness.     Physical Exam Updated Vital Signs BP 123/79   Pulse 74   Temp 98.2 F (36.8 C) (Oral)   Resp 18   LMP 03/18/2016   SpO2 98%   Physical Exam CONSTITUTIONAL: Well developed/well nourished, mildly anxious HEAD: Normocephalic/atraumatic EYES: EOMI/PERRL ENMT: Mucous membranes moist NECK: supple no meningeal signs CV: S1/S2 noted, no murmurs/rubs/gallops noted LUNGS: Lungs are clear to auscultation bilaterally, no apparent distress ABDOMEN: soft, nontender GU:no cva tenderness NEURO: Pt is awake/alert/appropriate. Mild right facial droop.  Right arm drift noted.  She has sensory deficit to right face/right Ue.   EXTREMITIES: pulses  normal/equal, full ROM SKIN: warm, color normal PSYCH: anxious  ED Treatments / Results  DIAGNOSTIC STUDIES: Oxygen Saturation is 98% on RA, normal by my interpretation.    COORDINATION OF CARE: 1:19 AM Discussed treatment plan with pt at bedside which includes  and pt agreed to plan.  tPA in stroke considered but not given due to: Recent GI bleed (cancelled per neurologist lindzen) 2:37 AM Pt stable Awake/alert She reports weakness to right face/right arm Denies active CP/Abd pain at this time Imaging pending Awaiting final neuro recs Labs (all labs ordered are listed, but only abnormal results are displayed) Labs Reviewed  COMPREHENSIVE METABOLIC PANEL - Abnormal; Notable for the following:       Result Value   Total Protein 6.3 (*)    Total Bilirubin 1.9 (*)    All other components within normal limits  I-STAT CHEM 8, ED - Abnormal; Notable for the following:    BUN 5 (*)    Glucose, Bld 103 (*)    Calcium, Ion 1.06 (*)    All other components within normal limits  ETHANOL  PROTIME-INR  APTT  CBC  DIFFERENTIAL  RAPID URINE DRUG SCREEN, HOSP PERFORMED  URINALYSIS, ROUTINE W REFLEX MICROSCOPIC  I-STAT TROPOININ, ED  I-STAT BETA HCG BLOOD, ED ([facility], [facility], [facility] ONLY)    EKG  EKG  Interpretation  Date/Time:  Monday March 25 2016 00:35:41 EST Ventricular Rate:  65 PR Interval:  158 QRS Duration: 80 QT Interval:  396 QTC Calculation: 411 R Axis:   76 Text Interpretation:  Normal sinus rhythm Normal ECG No significant change since last tracing Confirmed by Bebe Shaggy  MD, Niani Mourer ([id number]) on 03/25/2016 12:54:01 AM       Radiology Ct Angio Head W Or Wo Contrast  Result Date: 03/25/2016 CLINICAL DATA:  Stroke.  Right-sided weakness. EXAM: CT ANGIOGRAPHY HEAD AND NECK TECHNIQUE: Multidetector CT imaging of the head and neck was performed using the standard protocol during bolus administration of intravenous contrast. Multiplanar CT image reconstructions and MIPs were obtained to evaluate the vascular anatomy. Carotid stenosis measurements (when applicable) are obtained utilizing NASCET criteria, using the distal internal carotid diameter as the denominator. CONTRAST:  50 mL Isovue 370 IV COMPARISON:  Head CT 03/25/2016 FINDINGS: CTA NECK FINDINGS Aortic arch: There is no aneurysm or dissection of the visualized ascending aorta or aortic arch. There is a normal 3 vessel branching pattern. The visualized proximal subclavian arteries are normal. Right carotid system: The right common carotid origin is widely patent. There is no common carotid or internal carotid artery dissection or aneurysm. No hemodynamically significant stenosis. Left carotid system: The left common carotid origin is widely patent. There is no common carotid or internal carotid artery dissection or aneurysm. No hemodynamically significant stenosis. Vertebral arteries: The vertebral system is codominant. Both vertebral artery origins are normal. Both vertebral arteries are normal to their confluence with the basilar artery. Skeleton: There is no bony spinal canal stenosis. No lytic or blastic lesions. Other neck: The nasopharynx is clear. The oropharynx and hypopharynx are normal. The epiglottis is normal. The  supraglottic larynx, glottis and subglottic larynx are normal. No retropharyngeal collection. The parapharyngeal spaces are preserved. The parotid and submandibular glands are normal. No sialolithiasis or salivary ductal dilatation. The thyroid gland is normal. There is no cervical lymphadenopathy. Upper chest: No pneumothorax or pleural effusion. No nodules or masses. Review of the MIP images confirms the above findings CTA HEAD FINDINGS Anterior circulation: --Intracranial internal carotid arteries: Normal. --Anterior cerebral  arteries: Normal. --Middle cerebral arteries: Normal. --Posterior communicating arteries: Absent bilaterally. Posterior circulation: --Posterior cerebral arteries: Normal. --Superior cerebellar arteries: Normal. --Basilar artery: Normal. --Anterior inferior cerebellar arteries: Normal. --Posterior inferior cerebellar arteries: Normal. Venous sinuses: As permitted by contrast timing, patent. Anatomic variants: None Delayed phase: Not performed. Review of the MIP images confirms the above findings IMPRESSION: Normal CTA of the head and neck. Electronically Signed   By: Deatra Robinson M.D.   On: 03/25/2016 01:43   Ct Angio Neck W Or Wo Contrast  Result Date: 03/25/2016 CLINICAL DATA:  Stroke.  Right-sided weakness. EXAM: CT ANGIOGRAPHY HEAD AND NECK TECHNIQUE: Multidetector CT imaging of the head and neck was performed using the standard protocol during bolus administration of intravenous contrast. Multiplanar CT image reconstructions and MIPs were obtained to evaluate the vascular anatomy. Carotid stenosis measurements (when applicable) are obtained utilizing NASCET criteria, using the distal internal carotid diameter as the denominator. CONTRAST:  50 mL Isovue 370 IV COMPARISON:  Head CT 03/25/2016 FINDINGS: CTA NECK FINDINGS Aortic arch: There is no aneurysm or dissection of the visualized ascending aorta or aortic arch. There is a normal 3 vessel branching pattern. The visualized  proximal subclavian arteries are normal. Right carotid system: The right common carotid origin is widely patent. There is no common carotid or internal carotid artery dissection or aneurysm. No hemodynamically significant stenosis. Left carotid system: The left common carotid origin is widely patent. There is no common carotid or internal carotid artery dissection or aneurysm. No hemodynamically significant stenosis. Vertebral arteries: The vertebral system is codominant. Both vertebral artery origins are normal. Both vertebral arteries are normal to their confluence with the basilar artery. Skeleton: There is no bony spinal canal stenosis. No lytic or blastic lesions. Other neck: The nasopharynx is clear. The oropharynx and hypopharynx are normal. The epiglottis is normal. The supraglottic larynx, glottis and subglottic larynx are normal. No retropharyngeal collection. The parapharyngeal spaces are preserved. The parotid and submandibular glands are normal. No sialolithiasis or salivary ductal dilatation. The thyroid gland is normal. There is no cervical lymphadenopathy. Upper chest: No pneumothorax or pleural effusion. No nodules or masses. Review of the MIP images confirms the above findings CTA HEAD FINDINGS Anterior circulation: --Intracranial internal carotid arteries: Normal. --Anterior cerebral arteries: Normal. --Middle cerebral arteries: Normal. --Posterior communicating arteries: Absent bilaterally. Posterior circulation: --Posterior cerebral arteries: Normal. --Superior cerebellar arteries: Normal. --Basilar artery: Normal. --Anterior inferior cerebellar arteries: Normal. --Posterior inferior cerebellar arteries: Normal. Venous sinuses: As permitted by contrast timing, patent. Anatomic variants: None Delayed phase: Not performed. Review of the MIP images confirms the above findings IMPRESSION: Normal CTA of the head and neck. Electronically Signed   By: Deatra Robinson M.D.   On: 03/25/2016 01:43   Mr  Brain Wo Contrast  Result Date: 03/25/2016 CLINICAL DATA:  Right shoulder pain radiating into right arm. History of dural venous sinus thrombosis. EXAM: MRI HEAD WITHOUT CONTRAST MRV HEAD WITHOUT CONTRAST TECHNIQUE: Multiplanar, multiecho pulse sequences of the brain and surrounding structures were obtained without intravenous contrast. Angiographic images of the intracranial venous structures were obtained using MRV technique without intravenous contrast. COMPARISON:  Brain MRI 08/05/2014 FINDINGS: Brain: No focal diffusion restriction to indicate acute infarct. No intraparenchymal hemorrhage. The brain parenchymal signal is normal. No mass lesion or midline shift. No hydrocephalus or extra-axial fluid collection. The midline structures are normal. No age advanced or lobar predominant atrophy. Vascular: Major intracranial arterial and venous sinus flow voids are preserved. Unchanged left parietal focus of chronic microhemorrhage.  No cortical vein thrombosis identified. -MRV:  The MRV portion of the study is degraded by motion. --Superior sagittal sinus: Within the aspect of the superior sagittal sinus near the skull vertex, there is an area of absent flow related enhancement that is unchanged compared to the study of 08/05/2014. This corresponds to a filling defect within the superior sagittal sinus on the concomitant CTA. The remainder of the superior sagittal sinus is normal. --Straight sinus: Normal. --Inferior sagittal sinus, vein of Galen and internal cerebral veins: Normal. --Transverse sinuses: Normal. --Sigmoid sinuses: Normal. --Visualized jugular veins: Normal. Skull and upper cervical spine: The visualized skull base, calvarium, upper cervical spine and extracranial soft tissues are normal. Sinuses/Orbits: No fluid levels or advanced mucosal thickening. No mastoid effusion. Normal orbits. IMPRESSION: 1. No acute intracranial abnormality. 2. Area of absent flow related enhancement within the posterior  vertex area of the superior sagittal sinus is unchanged compared to 08/05/2014. This may simply be a arachnoid granulation, though residual thrombus remains a possibility. There is no increase or new evidence of venous sinus thrombosis. 3. For future evaluation for potential dural venous sinus thrombosis, CT venography is superior to MR venography. Electronically Signed   By: Deatra Robinson M.D.   On: 03/25/2016 02:57   Mr Susie Cassette Head  Result Date: 03/25/2016 CLINICAL DATA:  Right shoulder pain radiating into right arm. History of dural venous sinus thrombosis. EXAM: MRI HEAD WITHOUT CONTRAST MRV HEAD WITHOUT CONTRAST TECHNIQUE: Multiplanar, multiecho pulse sequences of the brain and surrounding structures were obtained without intravenous contrast. Angiographic images of the intracranial venous structures were obtained using MRV technique without intravenous contrast. COMPARISON:  Brain MRI 08/05/2014 FINDINGS: Brain: No focal diffusion restriction to indicate acute infarct. No intraparenchymal hemorrhage. The brain parenchymal signal is normal. No mass lesion or midline shift. No hydrocephalus or extra-axial fluid collection. The midline structures are normal. No age advanced or lobar predominant atrophy. Vascular: Major intracranial arterial and venous sinus flow voids are preserved. Unchanged left parietal focus of chronic microhemorrhage. No cortical vein thrombosis identified. -MRV:  The MRV portion of the study is degraded by motion. --Superior sagittal sinus: Within the aspect of the superior sagittal sinus near the skull vertex, there is an area of absent flow related enhancement that is unchanged compared to the study of 08/05/2014. This corresponds to a filling defect within the superior sagittal sinus on the concomitant CTA. The remainder of the superior sagittal sinus is normal. --Straight sinus: Normal. --Inferior sagittal sinus, vein of Galen and internal cerebral veins: Normal. --Transverse  sinuses: Normal. --Sigmoid sinuses: Normal. --Visualized jugular veins: Normal. Skull and upper cervical spine: The visualized skull base, calvarium, upper cervical spine and extracranial soft tissues are normal. Sinuses/Orbits: No fluid levels or advanced mucosal thickening. No mastoid effusion. Normal orbits. IMPRESSION: 1. No acute intracranial abnormality. 2. Area of absent flow related enhancement within the posterior vertex area of the superior sagittal sinus is unchanged compared to 08/05/2014. This may simply be a arachnoid granulation, though residual thrombus remains a possibility. There is no increase or new evidence of venous sinus thrombosis. 3. For future evaluation for potential dural venous sinus thrombosis, CT venography is superior to MR venography. Electronically Signed   By: Deatra Robinson M.D.   On: 03/25/2016 02:57   Ct Head Code Stroke W/o Cm  Result Date: 03/25/2016 CLINICAL DATA:  Code stroke.  Right-sided weakness EXAM: CT HEAD WITHOUT CONTRAST TECHNIQUE: Contiguous axial images were obtained from the base of the skull through the vertex without  intravenous contrast. COMPARISON:  None. FINDINGS: Brain: No mass lesion, intraparenchymal hemorrhage or extra-axial collection. No evidence of acute cortical infarct. Brain parenchyma and CSF-containing spaces are normal for age. Vascular: No hyperdense vessel or unexpected calcification. Skull: Normal visualized skull base, calvarium and extracranial soft tissues. Sinuses/Orbits: No sinus fluid levels or advanced mucosal thickening. No mastoid effusion. Normal orbits. ASPECTS [facility] Stroke Program Early CT Score) - Ganglionic level infarction (caudate, lentiform nuclei, internal capsule, insula, M1-M3 cortex): 7 - Supraganglionic infarction (M4-M6 cortex): 3 Total score (0-10 with 10 being normal): 10 IMPRESSION: 1. Normal head CT. 2. ASPECTS is 10. These results were called by telephone at the time of interpretation on 03/25/2016 at 1:02 am  to Dr. Caryl Pina, who verbally acknowledged these results. Electronically Signed   By: Deatra Robinson M.D.   On: 03/25/2016 01:04    Procedures Procedures (including critical care time)  Medications Ordered in ED Medications - No data to display   Initial Impression / Assessment and Plan / ED Course  I have reviewed the triage vital signs and the nursing notes.  Pertinent labs & imaging results that were available during my care of the patient were reviewed by me and considered in my medical decision making (see chart for details).  Clinical Course     3:41 AM Neuro recommends medicine admit D/w dr Clyde Lundborg for admission Pt stable, still reports HA, medicines ordered  Final Clinical Impressions(s) / ED Diagnoses   Final diagnoses:  Stroke (HCC)  Weakness    New Prescriptions New Prescriptions   No medications on file   I personally performed the services described in this documentation, which was scribed in my presence. The recorded information has been reviewed and is accurate.        Zadie Rhine, MD 03/25/16 [phone]

## 2016-03-25 NOTE — Progress Notes (Signed)
Inpatient Rehabilitation  PT is recommending IP Rehab.  I note pt's CTA of head and neck without LVO and brain MRI reveal no acute focus of restricted diffusion.  Currently we are not recommending IP rehab consult due to lack of rehab diagnosis, but will follow along as medical course evolves.  Please call if questions.  Weldon Picking PT Inpatient Rehab Admissions Coordinator Cell [phone] Office [phone]

## 2016-03-26 ENCOUNTER — Observation Stay (HOSPITAL_COMMUNITY): Payer: Medicaid Other

## 2016-03-26 DIAGNOSIS — K5090 Crohn's disease, unspecified, without complications: Secondary | ICD-10-CM | POA: Diagnosis not present

## 2016-03-26 DIAGNOSIS — G08 Intracranial and intraspinal phlebitis and thrombophlebitis: Secondary | ICD-10-CM | POA: Diagnosis not present

## 2016-03-26 DIAGNOSIS — R531 Weakness: Secondary | ICD-10-CM | POA: Diagnosis not present

## 2016-03-26 DIAGNOSIS — R079 Chest pain, unspecified: Secondary | ICD-10-CM | POA: Diagnosis not present

## 2016-03-26 DIAGNOSIS — D6859 Other primary thrombophilia: Secondary | ICD-10-CM | POA: Diagnosis not present

## 2016-03-26 LAB — CBC
HCT: 37 % (ref 36.0–46.0)
Hemoglobin: 12.8 g/dL (ref 12.0–15.0)
MCH: 31.7 pg (ref 26.0–34.0)
MCHC: 34.6 g/dL (ref 30.0–36.0)
MCV: 91.6 fL (ref 78.0–100.0)
Platelets: 147 10*3/uL — ABNORMAL LOW (ref 150–400)
RBC: 4.04 MIL/uL (ref 3.87–5.11)
RDW: 12.2 % (ref 11.5–15.5)
WBC: 5 10*3/uL (ref 4.0–10.5)

## 2016-03-26 LAB — BASIC METABOLIC PANEL
Anion gap: 8 (ref 5–15)
BUN: <5 mg/dL — ABNORMAL LOW (ref 6–20)
CO2: 22 mmol/L (ref 22–32)
Calcium: 8.1 mg/dL — ABNORMAL LOW (ref 8.9–10.3)
Chloride: 107 mmol/L (ref 101–111)
Creatinine, Ser: 0.77 mg/dL (ref 0.44–1.00)
GFR calc Af Amer: >60 mL/min (ref >60)
GFR calc non Af Amer: >60 mL/min (ref >60)
Glucose, Bld: 94 mg/dL (ref 65–99)
Potassium: 3.5 mmol/L (ref 3.5–5.1)
Sodium: 137 mmol/L (ref 135–145)

## 2016-03-26 LAB — HEMOGLOBIN A1C
Hgb A1c MFr Bld: 4.9 % (ref 4.8–5.6)
Mean Plasma Glucose: 94 mg/dL

## 2016-03-26 MED ORDER — OXYCODONE-ACETAMINOPHEN 5-325 MG PO TABS
1.0000 | ORAL_TABLET | ORAL | Status: DC | PRN
  Administered 2016-03-26 – 2016-03-27 (×4): 1 via ORAL
  Filled 2016-03-26 (×4): qty 1

## 2016-03-26 MED ORDER — POLYETHYLENE GLYCOL 3350 17 G PO PACK
17.0000 g | PACK | Freq: Every day | ORAL | Status: DC
  Administered 2016-03-26: 17 g via ORAL
  Filled 2016-03-26: qty 1

## 2016-03-26 MED ORDER — GABAPENTIN 100 MG PO CAPS: 100.0000 mg | ORAL_CAPSULE | Freq: Three times a day (TID) | ORAL | 0 refills | Status: DC

## 2016-03-26 NOTE — Progress Notes (Signed)
Physical Therapy Treatment Patient Details Name: Victoria Alvarez MRN: [id number] DOB: February 04, 1991 Today's Date: 03/26/2016    History of Present Illness Pt is a 26 y/o female with a PMH significant for prior stroke, who presents with R side UE weakness, N/T, and R foot N/T. MRI negative for acute findings.     PT Comments    Pt progressing towards physical therapy goals. Was able to demonstrate increased independence with mobility and improved tolerance for functional activity. Feel a sling may be helpful to provide support to the RUE during gait training as pt reports shoulder pain with RUE hanging unsupported. Will continue to follow and progress as able per POC.   Follow Up Recommendations  Outpatient PT;Supervision for mobility/OOB     Equipment Recommendations  None recommended by PT    Recommendations for Other Services       Precautions / Restrictions Precautions Precautions: Fall Restrictions Weight Bearing Restrictions: No    Mobility  Bed Mobility Overal bed mobility: Modified Independent Bed Mobility: Supine to Sit     Supine to sit: HOB elevated     General bed mobility comments: No assist required. Min use of rails.   Transfers Overall transfer level: Needs assistance   Transfers: Sit to/from Stand Sit to Stand: Supervision         General transfer comment: Pt was able to power-up to full standing position without assistance. Pt was able to support her R arm with her left for pain control.  Ambulation/Gait Ambulation/Gait assistance: Supervision Ambulation Distance (Feet): 200 Feet Assistive device: None Gait Pattern/deviations: Step-through pattern;Decreased stride length;Shuffle;Trunk flexed;Narrow base of support Gait velocity: Decreased Gait velocity interpretation: Below normal speed for age/gender General Gait Details: Pt was able to support her RUE with the LUE during ambulation. Gait speed and overall steadiness was improved from eval.     Stairs            Wheelchair Mobility    Modified Rankin (Stroke Patients Only) Modified Rankin (Stroke Patients Only) Pre-Morbid Rankin Score: No symptoms Modified Rankin: Moderately severe disability     Balance Overall balance assessment: Needs assistance Sitting-balance support: No upper extremity supported Sitting balance-Leahy Scale: Good     Standing balance support: No upper extremity supported;During functional activity Standing balance-Leahy Scale: Fair                      Cognition Arousal/Alertness: Awake/alert Behavior During Therapy: WFL for tasks assessed/performed;Anxious Overall Cognitive Status: Within Functional Limits for tasks assessed                      Exercises      General Comments        Pertinent Vitals/Pain Pain Assessment: Faces Faces Pain Scale: Hurts little more Pain Location: R shoulder Pain Descriptors / Indicators: Pins and needles;Tingling Pain Intervention(s): Monitored during session    Home Living                      Prior Function            PT Goals (current goals can now be found in the care plan section) Acute Rehab PT Goals Patient Stated Goal: to get back to taking care of her kids PT Goal Formulation: With patient Time For Goal Achievement: 04/01/16 Potential to Achieve Goals: Good Progress towards PT goals: Progressing toward goals    Frequency    Min 3X/week      PT Plan  Frequency needs to be updated    Co-evaluation             End of Session Equipment Utilized During Treatment: Gait belt Activity Tolerance: Patient tolerated treatment well Patient left: in chair;with call bell/phone within reach;with chair alarm set     Time: [phone] PT Time Calculation (min) (ACUTE ONLY): 19 min  Charges:  $Gait Training: 8-22 mins                    G Codes:      Marylynn Pearson April 09, 2016, 9:32 AM   Conni Slipper, PT, DPT Acute Rehabilitation  Services Pager: [phone]

## 2016-03-26 NOTE — Progress Notes (Signed)
STROKE TEAM PROGRESS NOTE   SUBJECTIVE (INTERVAL HISTORY) No family is at bedside. Pt still complains of right shoulder pain, put on sling for arm support. Her right arm tingling and numbness improved after neurontin. She can not lift arm up, but able to move elbow joint and fingers.   OBJECTIVE Temp:  [97.9 F (36.6 C)-98.3 F (36.8 C)] 98.1 F (36.7 C) (01/16 0934) Pulse Rate:  [48-68] 68 (01/16 0934) Cardiac Rhythm: Heart block (01/16 0703) Resp:  [15-18] 17 (01/16 0934) BP: (96-111)/(48-72) 111/66 (01/16 0934) SpO2:  [98 %-100 %] 100 % (01/16 0934)  CBC:   Recent Labs Lab 03/25/16 0037 03/25/16 0049 03/26/16 0211  WBC 5.7  --  5.0  NEUTROABS 2.9  --   --   HGB 14.9 13.6 12.8  HCT 41.6 40.0 37.0  MCV 91.2  --  91.6  PLT 183  --  147*    Basic Metabolic Panel:   Recent Labs Lab 03/25/16 0037 03/25/16 0049 03/26/16 0211  NA 139 139 137  K 3.5 3.5 3.5  CL 109 106 107  CO2 23  --  22  GLUCOSE 97 103* 94  BUN 6 5* <5*  CREATININE 0.71 0.80 0.77  CALCIUM 9.1  --  8.1*    Lipid Panel:     Component Value Date/Time   CHOL 194 03/25/2016 0448   TRIG 100 03/25/2016 0448   HDL 44 03/25/2016 0448   CHOLHDL 4.4 03/25/2016 0448   VLDL 20 03/25/2016 0448   LDLCALC 130 (H) 03/25/2016 0448   HgbA1c:  Lab Results  Component Value Date   HGBA1C 4.9 03/25/2016   Urine Drug Screen:     Component Value Date/Time   LABOPIA POSITIVE (A) 03/25/2016 1618   COCAINSCRNUR NONE DETECTED 03/25/2016 1618   COCAINSCRNUR NEG 03/01/2014 1006   LABBENZ NONE DETECTED 03/25/2016 1618   AMPHETMU NONE DETECTED 03/25/2016 1618   THCU POSITIVE (A) 03/25/2016 1618   LABBARB NONE DETECTED 03/25/2016 1618      IMAGING I have personally reviewed the radiological images below and agree with the radiology interpretations.  Ct Head Code Stroke W/o Cm 03/25/2016 1. Normal head CT. 2. ASPECTS is 10.  Ct Angio Head & Neck  03/25/2016 Normal CTA of the head and neck. No CVST.  Mr  Brain Wo Contrast 03/25/2016 1. No acute intracranial abnormality.   Mr [name] 03/25/2016 2. Area of absent flow related enhancement within the posterior vertex area of the superior sagittal sinus is unchanged compared to 08/05/2014. This may simply be a arachnoid granulation, though residual thrombus remains a possibility. There is no increase or new evidence of venous sinus thrombosis. 3. For future evaluation for potential dural venous sinus thrombosis, CT venography is superior to MR venography.  Dg Chest 2 View  03/25/2016 No active cardiopulmonary disease.   RUE venous doppler -  negative for deep vein thrombosis. There is evidence of acute superficial vein thrombosis involving the right basilic vein surrounding the IV.  Right shoulder X-ray - negative.    PHYSICAL EXAM  Temp:  [97.9 F (36.6 C)-98.3 F (36.8 C)] 98.1 F (36.7 C) (01/16 0934) Pulse Rate:  [48-68] 68 (01/16 0934) Resp:  [15-18] 17 (01/16 0934) BP: (96-111)/(48-72) 111/66 (01/16 0934) SpO2:  [98 %-100 %] 100 % (01/16 0934)  General - Well nourished, well developed, in mildly acute pain with right shoulder.  Ophthalmologic - Fundi not visualized due to noncooperation.  Cardiovascular - Regular rate and rhythm.  Mental Status -  Level of arousal and orientation to time, place, and person were intact. Language including expression, naming, repetition, comprehension was assessed and found intact. Fund of Knowledge was assessed and was intact.  Cranial Nerves II - XII - II - Visual field intact OU. III, IV, VI - Extraocular movements intact. V - decreased light touch and pinprick on the right, however, also positive on tuning fork test. VII - Facial movement intact bilaterally. VIII - Hearing & vestibular intact bilaterally. X - Palate elevates symmetrically. XI - Chin turning & shoulder shrug intact bilaterally. XII - Tongue protrusion intact.  Motor Strength - The patient's strength was normal  in all extremities except RUE 0/5 proximal due to shoulder pain, however, pt is able to flexion and extension of elbow and moving fingers.  Bulk was normal and fasciculations were absent.   Motor Tone - Muscle tone was assessed at the neck and appendages and was normal.  Reflexes - The patient's reflexes were 2+ BUEs and 3+ BLEs and she had no pathological reflexes.  Sensory - Light touch, temperature/pinprick were assessed and were decreased on the right UE.    Coordination - The patient had normal movements in the left hand with no ataxia or dysmetria.  Tremor was absent.  Gait and Station - deferred to PT/OT.   ASSESSMENT/PLAN Victoria Alvarez is a 26 y.o. 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 She did not receive IV t-PA due to high volume rectal bleeding.   right shoulder pain with right arm tingling  UE Doppler No DVT, but superficial vein thrombosis  X-ray right shoulder negative  neurontin 136m tid for symptom relief  Recommend right shoulder MRI for further evaluation  RUE weakness distractable  Tuning fork sign positive for facial numbness  Pt has previous hx of right sided hemiparethesia complains  CT head normal  CTA Head & neck normal and no CVST  MRI no acute stroke  MRV - no CVST  LDL 130   HgbA1c 4.9   Primary hypercoagulable disorder  06/2014 postpartum SSS CSVT - put on coumadin - noncompliance with coumadin - changed to eliquis  09/2014 - protal vein thrombosis - continued eliquis  12/2014 - ileocoloectomy - ? 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 - eliquis changed to Xarelto for better compliance   xarelto for VTE prophylaxis Diet Heart Room service appropriate? Yes; Fluid consistency: Thin  Xarelto (rivaroxaban) daily prior to  admission stop 4 days prior due to heavy menses and rectal bleeding, now on Xarelto (rivaroxaban) daily  Patient counseled to be compliant with her antithrombotic medications  Ongoing aggressive stroke risk factor management  Therapy recommendations:  outpt PT   Disposition:  Return home  Menorrhagia  Reason for pt stopped Xarelto lately  Recommend to discuss with OBGYN about menses suppression therapy   But recommend to avoid estrogen due to risk of thrombosis  Hyperlipidemia  Home meds:  No statin  LDL 130, goal < 70  Add lipitor 239m Continue statin at discharge  Tobacco abuse  Current smoker  Smoking increase the risk of thrombosis especially for her with hypercoagulable state  Smoking cessation counseling provided  Nicotine patch provided  Pt is willing to quit  Other Stroke Risk Factors  Obesity, Body mass index is 31.01 kg/m., recommend weight loss, diet and exercise as appropriate  Family hx stroke (maternal grandfather)  migraine  Other Active Problems  Chest pain - troponin negative and EKG negative - likely related to right shoulder pain  Depression  S/p ileocolectomy   Hospital day # 0  Rosalin Hawking, MD PhD Stroke Neurology 03/26/2016 3:10 PM    To contact Stroke Continuity provider, please refer to [email or web address] After hours, contact General Neurology

## 2016-03-26 NOTE — Care Management Note (Signed)
Case Management Note  Patient Details  Name: Victoria Alvarez MRN: [id number] Date of Birth: 01/27/1991  Subjective/Objective:                    Action/Plan: Plan is for patient to discharge home with outpatient therapy. CM met with the patient and she is interested in attending outpatient rehab on [place]. Orders placed in EPIC and information on the AVS.   Expected Discharge Date:                  Expected Discharge Plan:  Home/Self Care  In-House Referral:  Clinical Social Work  Discharge planning Services     Post Acute Care Choice:    Choice offered to:     DME Arranged:    DME Agency:     HH Arranged:    [place] Agency:     Status of Service:  Completed, signed off  If discussed at [employer], dates discussed:    Additional Comments:  Pollie Friar, RN 03/26/2016, 1:25 PM

## 2016-03-26 NOTE — Progress Notes (Signed)
Orthopedic Tech Progress Note Patient Details:  Victoria Alvarez 09/20/1990 [id number]  Ortho Devices Type of Ortho Device: Arm sling Ortho Device/Splint Location: rue Ortho Device/Splint Interventions: Application   Nikki Dom 03/26/2016, 9:48 AM

## 2016-03-26 NOTE — Discharge Summary (Signed)
Physician Discharge Summary  Victoria Alvarez ZOX:[id number] DOB: [birth year] DOA: 03/25/2016  PCP: Lora Paula, MD  Admit date: 03/25/2016 Discharge date: 03/26/2016  Recommendations for Outpatient Follow-up:  1. Pt will need to follow up with PCP in 2-3 weeks post discharge  Discharge Diagnoses:  Principal Problem:   Right sided weakness  Discharge Condition: Stable  Diet recommendation: Heart healthy diet discussed in details   History of present illness:  26 y.o. female with medical history significant of hypercoagulable status, portal vein thrombosis, cerebral venous sinus thrombosis, obesity, stroke, Crohn's disease, tobacco abuse, depression, asthma, seizure, who presents with right-sided weakness and difficulty speaking and right should pain, difficulty with performing activities with right arm.  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 - eliquis changed to Xarelto for better compliance  on Xarelto (rivaroxaban) daily  Menorrhagia  Reason for pt stopped Xarelto lately  Recommended to discuss with OBGYN about menses suppression therapy   Also recommend to avoid estrogen due to risk of thrombosis  Hyperlipidemia  Home meds:  No statin  LDL 130, goal < 70  Added Lipitor on discharge per neurologist recommendations   Tobacco abuse  Current smoker  Smoking increase the risk of thrombosis  especially for her with hypercoagulable state  Smoking cessation counseling provided  Chest pain  Resolved   Obesity   Body mass index is 31.01 kg/m.   Procedures/Studies: Ct Angio Head W Or Wo Contrast  Result Date: 03/25/2016 CLINICAL DATA:  Stroke.  Right-sided weakness. EXAM: CT ANGIOGRAPHY HEAD AND NECK TECHNIQUE: Multidetector CT imaging of the head and neck was performed using the standard protocol during bolus administration of intravenous contrast. Multiplanar CT image reconstructions and MIPs were obtained to evaluate the vascular anatomy. Carotid stenosis measurements (when applicable) are obtained utilizing NASCET criteria, using the distal internal carotid diameter as the denominator. CONTRAST:  50 mL Isovue 370 IV COMPARISON:  Head CT 03/25/2016 FINDINGS: CTA NECK FINDINGS Aortic arch: There is no aneurysm or dissection of the visualized ascending aorta or aortic arch. There is a normal 3 vessel branching pattern. The visualized proximal subclavian arteries are normal. Right carotid system: The right common carotid origin is widely patent. There is no common carotid or internal carotid artery dissection or aneurysm. No hemodynamically significant stenosis. Left carotid system: The left common carotid origin is widely patent. There is no common carotid or internal carotid artery dissection or aneurysm. No hemodynamically significant stenosis. Vertebral arteries: The vertebral system is codominant. Both vertebral artery origins are normal. Both vertebral arteries are normal to their confluence with the basilar artery. Skeleton: There is no bony spinal canal stenosis. No lytic or blastic lesions. Other neck: The nasopharynx is clear. The oropharynx and hypopharynx are normal. The epiglottis is normal. The supraglottic larynx, glottis and subglottic larynx are normal. No retropharyngeal collection. The parapharyngeal spaces are preserved. The parotid and submandibular glands are normal.  No sialolithiasis or salivary ductal dilatation. The thyroid gland is normal. There is no cervical lymphadenopathy. Upper chest: No pneumothorax or pleural effusion. No nodules or masses. Review of the MIP images confirms the above findings CTA HEAD FINDINGS Anterior circulation: --Intracranial internal carotid arteries: Normal. --Anterior cerebral arteries: Normal. --Middle cerebral arteries: Normal. --Posterior communicating arteries: Absent bilaterally. Posterior circulation: --Posterior cerebral arteries: Normal. --Superior cerebellar arteries: Normal. --Basilar artery: Normal. --Anterior inferior cerebellar arteries: Normal. --Posterior inferior cerebellar arteries: Normal. Venous sinuses: As permitted by contrast timing, patent. Anatomic variants: None Delayed phase: Not performed. Review of the MIP images confirms the above findings IMPRESSION: Normal CTA of the head and neck. Electronically Signed   By: Deatra Robinson M.D.   On: 03/25/2016 01:43   Dg Chest 2 View  Result Date: 03/25/2016 CLINICAL DATA:  Left-sided chest pain EXAM: CHEST  2 VIEW COMPARISON:  Chest CT 03/12/2015 FINDINGS: Shallow lung inflation. Cardiomediastinal contours are normal. No focal airspace consolidation or pulmonary edema. No pneumothorax or sizable pleural effusion. IMPRESSION: No active cardiopulmonary disease. Electronically Signed   By: Deatra Robinson M.D.   On: 03/25/2016 04:51   Dg Shoulder Right  Result Date: 03/25/2016 CLINICAL DATA:  Right shoulder pain EXAM: RIGHT SHOULDER - 2+ VIEW COMPARISON:  None. FINDINGS: There is no evidence of fracture or dislocation. There is no evidence of arthropathy or other focal bone abnormality. Soft tissues are unremarkable. IMPRESSION: Negative. Electronically Signed   By: Marlan Palau M.D.   On: 03/25/2016 12:57   Ct Angio Neck W Or Wo Contrast  Result Date: 03/25/2016 CLINICAL DATA:  Stroke.  Right-sided weakness. EXAM: CT ANGIOGRAPHY HEAD AND NECK TECHNIQUE: Multidetector  CT imaging of the head and neck was performed using the standard protocol during bolus administration of intravenous contrast. Multiplanar CT image reconstructions and MIPs were obtained to evaluate the vascular anatomy. Carotid stenosis measurements (when applicable) are obtained utilizing NASCET criteria, using the distal internal carotid diameter as the denominator. CONTRAST:  50 mL Isovue 370 IV COMPARISON:  Head CT 03/25/2016 FINDINGS: CTA NECK FINDINGS Aortic arch: There is no aneurysm or dissection of the visualized ascending aorta or aortic arch. There is a normal 3 vessel branching pattern. The visualized proximal subclavian arteries are normal. Right carotid system: The right common carotid origin is widely patent. There is no common carotid or internal carotid artery dissection or aneurysm. No hemodynamically significant stenosis. Left carotid system: The left common carotid origin is widely patent. There is no common carotid or internal carotid artery dissection or aneurysm. No hemodynamically significant stenosis. Vertebral arteries: The vertebral system is codominant. Both vertebral artery origins are normal. Both vertebral arteries are normal to their confluence with the basilar artery. Skeleton: There is no bony spinal canal stenosis. No lytic or blastic lesions. Other neck: The nasopharynx is clear. The oropharynx and hypopharynx are normal. The epiglottis is normal. The supraglottic larynx, glottis and subglottic larynx are normal. No retropharyngeal collection. The parapharyngeal spaces are preserved. The parotid and submandibular glands are normal. No sialolithiasis or salivary ductal dilatation. The thyroid gland is normal. There is no cervical lymphadenopathy. Upper chest: No pneumothorax or pleural effusion. No nodules or masses. Review of the MIP images confirms the above findings CTA HEAD FINDINGS Anterior circulation: --Intracranial internal carotid arteries: Normal. --Anterior cerebral  arteries: Normal. --Middle cerebral arteries: Normal. --Posterior communicating arteries: Absent bilaterally. Posterior circulation: --Posterior cerebral arteries: Normal. --Superior cerebellar arteries: Normal. --Basilar artery: Normal. --Anterior inferior cerebellar arteries: Normal. --Posterior inferior cerebellar arteries: Normal.  Venous sinuses: As permitted by contrast timing, patent. Anatomic variants: None Delayed phase: Not performed. Review of the MIP images confirms the above findings IMPRESSION: Normal CTA of the head and neck. Electronically Signed   By: Deatra Robinson M.D.   On: 03/25/2016 01:43   Mr Brain Wo Contrast  Result Date: 03/25/2016 CLINICAL DATA:  Right shoulder pain radiating into right arm. History of dural venous sinus thrombosis. EXAM: MRI HEAD WITHOUT CONTRAST MRV HEAD WITHOUT CONTRAST TECHNIQUE: Multiplanar, multiecho pulse sequences of the brain and surrounding structures were obtained without intravenous contrast. Angiographic images of the intracranial venous structures were obtained using MRV technique without intravenous contrast. COMPARISON:  Brain MRI 08/05/2014 FINDINGS: Brain: No focal diffusion restriction to indicate acute infarct. No intraparenchymal hemorrhage. The brain parenchymal signal is normal. No mass lesion or midline shift. No hydrocephalus or extra-axial fluid collection. The midline structures are normal. No age advanced or lobar predominant atrophy. Vascular: Major intracranial arterial and venous sinus flow voids are preserved. Unchanged left parietal focus of chronic microhemorrhage. No cortical vein thrombosis identified. -MRV:  The MRV portion of the study is degraded by motion. --Superior sagittal sinus: Within the aspect of the superior sagittal sinus near the skull vertex, there is an area of absent flow related enhancement that is unchanged compared to the study of 08/05/2014. This corresponds to a filling defect within the superior sagittal sinus on  the concomitant CTA. The remainder of the superior sagittal sinus is normal. --Straight sinus: Normal. --Inferior sagittal sinus, vein of Galen and internal cerebral veins: Normal. --Transverse sinuses: Normal. --Sigmoid sinuses: Normal. --Visualized jugular veins: Normal. Skull and upper cervical spine: The visualized skull base, calvarium, upper cervical spine and extracranial soft tissues are normal. Sinuses/Orbits: No fluid levels or advanced mucosal thickening. No mastoid effusion. Normal orbits. IMPRESSION: 1. No acute intracranial abnormality. 2. Area of absent flow related enhancement within the posterior vertex area of the superior sagittal sinus is unchanged compared to 08/05/2014. This may simply be a arachnoid granulation, though residual thrombus remains a possibility. There is no increase or new evidence of venous sinus thrombosis. 3. For future evaluation for potential dural venous sinus thrombosis, CT venography is superior to MR venography. Electronically Signed   By: Deatra Robinson M.D.   On: 03/25/2016 02:57   Mr Susie Cassette Head  Result Date: 03/25/2016 CLINICAL DATA:  Right shoulder pain radiating into right arm. History of dural venous sinus thrombosis. EXAM: MRI HEAD WITHOUT CONTRAST MRV HEAD WITHOUT CONTRAST TECHNIQUE: Multiplanar, multiecho pulse sequences of the brain and surrounding structures were obtained without intravenous contrast. Angiographic images of the intracranial venous structures were obtained using MRV technique without intravenous contrast. COMPARISON:  Brain MRI 08/05/2014 FINDINGS: Brain: No focal diffusion restriction to indicate acute infarct. No intraparenchymal hemorrhage. The brain parenchymal signal is normal. No mass lesion or midline shift. No hydrocephalus or extra-axial fluid collection. The midline structures are normal. No age advanced or lobar predominant atrophy. Vascular: Major intracranial arterial and venous sinus flow voids are preserved. Unchanged left  parietal focus of chronic microhemorrhage. No cortical vein thrombosis identified. -MRV:  The MRV portion of the study is degraded by motion. --Superior sagittal sinus: Within the aspect of the superior sagittal sinus near the skull vertex, there is an area of absent flow related enhancement that is unchanged compared to the study of 08/05/2014. This corresponds to a filling defect within the superior sagittal sinus on the concomitant CTA. The remainder of the superior sagittal sinus is normal. --Straight sinus:  Normal. --Inferior sagittal sinus, vein of Galen and internal cerebral veins: Normal. --Transverse sinuses: Normal. --Sigmoid sinuses: Normal. --Visualized jugular veins: Normal. Skull and upper cervical spine: The visualized skull base, calvarium, upper cervical spine and extracranial soft tissues are normal. Sinuses/Orbits: No fluid levels or advanced mucosal thickening. No mastoid effusion. Normal orbits. IMPRESSION: 1. No acute intracranial abnormality. 2. Area of absent flow related enhancement within the posterior vertex area of the superior sagittal sinus is unchanged compared to 08/05/2014. This may simply be a arachnoid granulation, though residual thrombus remains a possibility. There is no increase or new evidence of venous sinus thrombosis. 3. For future evaluation for potential dural venous sinus thrombosis, CT venography is superior to MR venography. Electronically Signed   By: Deatra Robinson M.D.   On: 03/25/2016 02:57   Ct Head Code Stroke W/o Cm  Result Date: 03/25/2016 CLINICAL DATA:  Code stroke.  Right-sided weakness EXAM: CT HEAD WITHOUT CONTRAST TECHNIQUE: Contiguous axial images were obtained from the base of the skull through the vertex without intravenous contrast. COMPARISON:  None. FINDINGS: Brain: No mass lesion, intraparenchymal hemorrhage or extra-axial collection. No evidence of acute cortical infarct. Brain parenchyma and CSF-containing spaces are normal for age. Vascular:  No hyperdense vessel or unexpected calcification. Skull: Normal visualized skull base, calvarium and extracranial soft tissues. Sinuses/Orbits: No sinus fluid levels or advanced mucosal thickening. No mastoid effusion. Normal orbits. ASPECTS [facility] Stroke Program Early CT Score) - Ganglionic level infarction (caudate, lentiform nuclei, internal capsule, insula, M1-M3 cortex): 7 - Supraganglionic infarction (M4-M6 cortex): 3 Total score (0-10 with 10 being normal): 10 IMPRESSION: 1. Normal head CT. 2. ASPECTS is 10. These results were called by telephone at the time of interpretation on 03/25/2016 at 1:02 am to Dr. Caryl Pina, who verbally acknowledged these results. Electronically Signed   By: Deatra Robinson M.D.   On: 03/25/2016 01:04     Discharge Exam: Vitals:   03/26/16 0538 03/26/16 0934  BP: (!) 98/57 111/66  Pulse: (!) 48 68  Resp: 18 17  Temp: 97.9 F (36.6 C) 98.1 F (36.7 C)   Vitals:   03/25/16 2157 03/26/16 0118 03/26/16 0538 03/26/16 0934  BP: (!) 97/48 (!) 99/50 (!) 98/57 111/66  Pulse: (!) 54 (!) 57 (!) 48 68  Resp: 16 16 18 17   Temp: 98.3 F (36.8 C) 98.3 F (36.8 C) 97.9 F (36.6 C) 98.1 F (36.7 C)  TempSrc: Oral Oral Oral Oral  SpO2: 100% 100% 99% 100%  Weight:      Height:        General: Pt is alert, follows commands appropriately, not in acute distress Cardiovascular: Regular rate and rhythm, S1/S2 +, no murmurs, no rubs, no gallops Respiratory: Clear to auscultation bilaterally, no wheezing, no crackles, no rhonchi Abdominal: Soft, non tender, non distended, bowel sounds +, no guarding   Discharge Instructions  Discharge Instructions    Ambulatory referral to Physical Therapy    Complete by:  As directed      Allergies as of 03/27/2016      Reactions   Bee Venom Swelling      Medication List    STOP taking these medications   levETIRAcetam 500 MG tablet Commonly known as:  KEPPRA   methadone 5 MG tablet Commonly known as:  DOLOPHINE    polyethylene glycol packet Commonly known as:  MIRALAX / GLYCOLAX     TAKE these medications   atorvastatin 20 MG tablet Commonly known as:  LIPITOR Take 1 tablet (  20 mg total) by mouth daily.   ferrous sulfate 325 (65 FE) MG tablet Take 1 tablet (325 mg total) by mouth 3 (three) times daily with meals.   gabapentin 100 MG capsule Commonly known as:  NEURONTIN Take 1 capsule (100 mg total) by mouth 3 (three) times daily.   ondansetron 4 MG tablet Commonly known as:  ZOFRAN Take 1 tablet (4 mg total) by mouth every 6 (six) hours as needed for nausea.   Oxycodone HCl 10 MG Tabs Take 10 mg by mouth every 6 (six) hours.   oxyCODONE-acetaminophen 5-325 MG tablet Commonly known as:  PERCOCET/ROXICET Take 1 tablet by mouth every 3 (three) hours as needed (breakthrough pain).   rivaroxaban 20 MG Tabs tablet Commonly known as:  XARELTO Take 1 tablet (20 mg total) by mouth daily with supper. What changed:  Another medication with the same name was removed. Continue taking this medication, and follow the directions you see here.       Follow-up Information    [facility] Follow up.   Specialty:  Rehabilitation Why:  They will contact you for the first appointment. Contact information: [address] [id number] [facility] [address] [place] [address] [phone]       Lora Paula, MD Follow up.   Specialty:  Family Medicine Contact information: [address] [place] [address] [phone]            The results of significant diagnostics from this hospitalization (including imaging, microbiology, ancillary and laboratory) are listed below for reference.     Microbiology: No results found for this or any previous visit (from the past 240 hour(s)).   Labs: Basic Metabolic Panel:  Recent Labs Lab 03/25/16 0037 03/25/16 0049 03/26/16 0211  NA 139 139 137  K 3.5 3.5 3.5  CL 109 106 107  CO2 23  --  22   GLUCOSE 97 103* 94  BUN 6 5* <5*  CREATININE 0.71 0.80 0.77  CALCIUM 9.1  --  8.1*   Liver Function Tests:  Recent Labs Lab 03/25/16 0037  AST 18  ALT 20  ALKPHOS 51  BILITOT 1.9*  PROT 6.3*  ALBUMIN 3.8   CBC:  Recent Labs Lab 03/25/16 0037 03/25/16 0049 03/26/16 0211  WBC 5.7  --  5.0  NEUTROABS 2.9  --   --   HGB 14.9 13.6 12.8  HCT 41.6 40.0 37.0  MCV 91.2  --  91.6  PLT 183  --  147*   Cardiac Enzymes:  Recent Labs Lab 03/25/16 0448 03/25/16 0954 03/25/16 1617  TROPONINI <0.03 <0.03 <0.03    SIGNED: Time coordinating discharge: 30 minutes  Debbora Presto, MD  [facility] 03/26/2016, 1:34 PM Pager [phone]  If 7PM-7AM, please contact night-coverage [email or web address] Password [email or web address]

## 2016-03-26 NOTE — Progress Notes (Signed)
Pt. Able to ambulate 200' with supervision with PT and no assistive divide.  PT. Is recommending OPPT.  No need for CIR.  I will sign off.    Weldon Picking PT Inpatient Rehab Admissions Coordinator Cell [phone] Office [phone]

## 2016-03-26 NOTE — Progress Notes (Signed)
[employer] paged for right shoulder sling

## 2016-03-27 MED ORDER — GABAPENTIN 100 MG PO CAPS: 100.0000 mg | ORAL_CAPSULE | Freq: Three times a day (TID) | ORAL | 0 refills | Status: DC

## 2016-03-27 MED ORDER — OXYCODONE-ACETAMINOPHEN 5-325 MG PO TABS: 1.0000 | ORAL_TABLET | ORAL | 0 refills | Status: DC | PRN

## 2016-03-27 MED ORDER — ATORVASTATIN CALCIUM 20 MG PO TABS: 20.0000 mg | ORAL_TABLET | Freq: Every day | ORAL | 0 refills | Status: DC

## 2016-03-27 MED FILL — GABAPENTIN 100 MG CAPSULE: 100 | 10 days supply | Qty: 30 | Fill #0

## 2016-03-27 NOTE — Progress Notes (Signed)
RN discussed discharge instructions with patient including f/u with PCP, states she has a pcp that is not listed on discharge paperwork but she has the number and will make an appt. Aware outpatient therapy is set up. Given prescriptions for percocet and lipitor. Educated on lipitor and percocet including route, indication, frequency. Neuro assessment unchanged. Will continue to monitor

## 2016-03-27 NOTE — Discharge Summary (Signed)
Physician Discharge Summary  Victoria Alvarez ZOX:[id number] DOB: 1990-08-09 DOA: 03/25/2016  PCP: Lora Paula, MD  Admit date: 03/25/2016 Discharge date: 03/27/2016  Recommendations for Outpatient Follow-up:  1. Pt will need to follow up with PCP in 2-3 weeks post discharge  Discharge Diagnoses:  Principal Problem:   Right sided weakness  Discharge Condition: Stable  Diet recommendation: Heart healthy diet discussed in details   History of present illness:  26 y.o. female with medical history significant of hypercoagulable status, portal vein thrombosis, cerebral venous sinus thrombosis, obesity, stroke, Crohn's disease, tobacco abuse, depression, asthma, seizure, who presents with right-sided weakness and difficulty speaking and right should pain, difficulty with performing activities with right arm.  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 - eliquis changed to Xarelto for better compliance  on Xarelto (rivaroxaban) daily  Menorrhagia  Reason for pt stopped Xarelto lately  Recommended to discuss with OBGYN about menses suppression therapy   Also recommend to avoid estrogen due to risk of thrombosis  Hyperlipidemia  Home meds:  No statin  LDL 130, goal < 70  Added Lipitor on discharge per neurologist recommendations   Tobacco abuse  Current smoker  Smoking increase the risk of thrombosis  especially for her with hypercoagulable state  Smoking cessation counseling provided  Chest pain  Resolved   Obesity  Body mass index is 31.01 kg/m.   Procedures/Studies: Ct Angio Head W Or Wo Contrast  Result Date: 03/25/2016 CLINICAL DATA:  Stroke.  Right-sided weakness. EXAM: CT ANGIOGRAPHY HEAD AND NECK TECHNIQUE: Multidetector CT imaging of the head and neck was performed using the standard protocol during bolus administration of intravenous contrast. Multiplanar CT image reconstructions and MIPs were obtained to evaluate the vascular anatomy. Carotid stenosis measurements (when applicable) are obtained utilizing NASCET criteria, using the distal internal carotid diameter as the denominator. CONTRAST:  50 mL Isovue 370 IV COMPARISON:  Head CT 03/25/2016 FINDINGS: CTA NECK FINDINGS Aortic arch: There is no aneurysm or dissection of the visualized ascending aorta or aortic arch. There is a normal 3 vessel branching pattern. The visualized proximal subclavian arteries are normal. Right carotid system: The right common carotid origin is widely patent. There is no common carotid or internal carotid artery dissection or aneurysm. No hemodynamically significant stenosis. Left carotid system: The left common carotid origin is widely patent. There is no common carotid or internal carotid artery dissection or aneurysm. No hemodynamically significant stenosis. Vertebral arteries: The vertebral system is codominant. Both vertebral artery origins are normal. Both vertebral arteries are normal to their confluence with the basilar artery. Skeleton: There is no bony spinal canal stenosis. No lytic or blastic lesions. Other neck: The nasopharynx is clear. The oropharynx and hypopharynx are normal. The epiglottis is normal. The supraglottic larynx, glottis and subglottic larynx are normal. No retropharyngeal collection. The parapharyngeal spaces are preserved. The parotid and submandibular glands are normal. No  sialolithiasis or salivary ductal dilatation. The thyroid gland is normal. There is no cervical lymphadenopathy. Upper chest: No pneumothorax or pleural effusion. No nodules or masses. Review of the MIP images confirms the above findings CTA HEAD FINDINGS Anterior circulation: --Intracranial internal carotid arteries: Normal. --Anterior cerebral arteries: Normal. --Middle cerebral arteries: Normal. --Posterior communicating arteries: Absent bilaterally. Posterior circulation: --Posterior cerebral arteries: Normal. --Superior cerebellar arteries: Normal. --Basilar artery: Normal. --Anterior inferior cerebellar arteries: Normal. --Posterior inferior cerebellar arteries: Normal. Venous sinuses: As permitted by contrast timing, patent. Anatomic variants: None Delayed phase: Not performed. Review of the MIP images confirms the above findings IMPRESSION: Normal CTA of the head and neck. Electronically Signed   By: Deatra Robinson M.D.   On: 03/25/2016 01:43   Dg Chest 2 View  Result Date: 03/25/2016 CLINICAL DATA:  Left-sided chest pain EXAM: CHEST  2 VIEW COMPARISON:  Chest CT 03/12/2015 FINDINGS: Shallow lung inflation. Cardiomediastinal contours are normal. No focal airspace consolidation or pulmonary edema. No pneumothorax or sizable pleural effusion. IMPRESSION: No active cardiopulmonary disease. Electronically Signed   By: Deatra Robinson M.D.   On: 03/25/2016 04:51   Dg Shoulder Right  Result Date: 03/25/2016 CLINICAL DATA:  Right shoulder pain EXAM: RIGHT SHOULDER - 2+ VIEW COMPARISON:  None. FINDINGS: There is no evidence of fracture or dislocation. There is no evidence of arthropathy or other focal bone abnormality. Soft tissues are unremarkable. IMPRESSION: Negative. Electronically Signed   By: Marlan Palau M.D.   On: 03/25/2016 12:57   Ct Angio Neck W Or Wo Contrast  Result Date: 03/25/2016 CLINICAL DATA:  Stroke.  Right-sided weakness. EXAM: CT ANGIOGRAPHY HEAD AND NECK TECHNIQUE: Multidetector CT  imaging of the head and neck was performed using the standard protocol during bolus administration of intravenous contrast. Multiplanar CT image reconstructions and MIPs were obtained to evaluate the vascular anatomy. Carotid stenosis measurements (when applicable) are obtained utilizing NASCET criteria, using the distal internal carotid diameter as the denominator. CONTRAST:  50 mL Isovue 370 IV COMPARISON:  Head CT 03/25/2016 FINDINGS: CTA NECK FINDINGS Aortic arch: There is no aneurysm or dissection of the visualized ascending aorta or aortic arch. There is a normal 3 vessel branching pattern. The visualized proximal subclavian arteries are normal. Right carotid system: The right common carotid origin is widely patent. There is no common carotid or internal carotid artery dissection or aneurysm. No hemodynamically significant stenosis. Left carotid system: The left common carotid origin is widely patent. There is no common carotid or internal carotid artery dissection or aneurysm. No hemodynamically significant stenosis. Vertebral arteries: The vertebral system is codominant. Both vertebral artery origins are normal. Both vertebral arteries are normal to their confluence with the basilar artery. Skeleton: There is no bony spinal canal stenosis. No lytic or blastic lesions. Other neck: The nasopharynx is clear. The oropharynx and hypopharynx are normal. The epiglottis is normal. The supraglottic larynx, glottis and subglottic larynx are normal. No retropharyngeal collection. The parapharyngeal spaces are preserved. The parotid and submandibular glands are normal. No sialolithiasis or salivary ductal dilatation. The thyroid gland is normal. There is no cervical lymphadenopathy. Upper chest: No pneumothorax or pleural effusion. No nodules or masses. Review of the MIP images confirms the above findings CTA HEAD FINDINGS Anterior circulation: --Intracranial internal carotid arteries: Normal. --Anterior cerebral  arteries: Normal. --Middle cerebral arteries: Normal. --Posterior communicating arteries: Absent bilaterally. Posterior circulation: --Posterior cerebral arteries: Normal. --Superior cerebellar arteries: Normal. --Basilar artery: Normal. --Anterior inferior cerebellar arteries: Normal. --Posterior inferior cerebellar arteries: Normal. Venous  sinuses: As permitted by contrast timing, patent. Anatomic variants: None Delayed phase: Not performed. Review of the MIP images confirms the above findings IMPRESSION: Normal CTA of the head and neck. Electronically Signed   By: Deatra Robinson M.D.   On: 03/25/2016 01:43   Mr Brain Wo Contrast  Result Date: 03/25/2016 CLINICAL DATA:  Right shoulder pain radiating into right arm. History of dural venous sinus thrombosis. EXAM: MRI HEAD WITHOUT CONTRAST MRV HEAD WITHOUT CONTRAST TECHNIQUE: Multiplanar, multiecho pulse sequences of the brain and surrounding structures were obtained without intravenous contrast. Angiographic images of the intracranial venous structures were obtained using MRV technique without intravenous contrast. COMPARISON:  Brain MRI 08/05/2014 FINDINGS: Brain: No focal diffusion restriction to indicate acute infarct. No intraparenchymal hemorrhage. The brain parenchymal signal is normal. No mass lesion or midline shift. No hydrocephalus or extra-axial fluid collection. The midline structures are normal. No age advanced or lobar predominant atrophy. Vascular: Major intracranial arterial and venous sinus flow voids are preserved. Unchanged left parietal focus of chronic microhemorrhage. No cortical vein thrombosis identified. -MRV:  The MRV portion of the study is degraded by motion. --Superior sagittal sinus: Within the aspect of the superior sagittal sinus near the skull vertex, there is an area of absent flow related enhancement that is unchanged compared to the study of 08/05/2014. This corresponds to a filling defect within the superior sagittal sinus on  the concomitant CTA. The remainder of the superior sagittal sinus is normal. --Straight sinus: Normal. --Inferior sagittal sinus, vein of Galen and internal cerebral veins: Normal. --Transverse sinuses: Normal. --Sigmoid sinuses: Normal. --Visualized jugular veins: Normal. Skull and upper cervical spine: The visualized skull base, calvarium, upper cervical spine and extracranial soft tissues are normal. Sinuses/Orbits: No fluid levels or advanced mucosal thickening. No mastoid effusion. Normal orbits. IMPRESSION: 1. No acute intracranial abnormality. 2. Area of absent flow related enhancement within the posterior vertex area of the superior sagittal sinus is unchanged compared to 08/05/2014. This may simply be a arachnoid granulation, though residual thrombus remains a possibility. There is no increase or new evidence of venous sinus thrombosis. 3. For future evaluation for potential dural venous sinus thrombosis, CT venography is superior to MR venography. Electronically Signed   By: Deatra Robinson M.D.   On: 03/25/2016 02:57   Mr Susie Cassette Head  Result Date: 03/25/2016 CLINICAL DATA:  Right shoulder pain radiating into right arm. History of dural venous sinus thrombosis. EXAM: MRI HEAD WITHOUT CONTRAST MRV HEAD WITHOUT CONTRAST TECHNIQUE: Multiplanar, multiecho pulse sequences of the brain and surrounding structures were obtained without intravenous contrast. Angiographic images of the intracranial venous structures were obtained using MRV technique without intravenous contrast. COMPARISON:  Brain MRI 08/05/2014 FINDINGS: Brain: No focal diffusion restriction to indicate acute infarct. No intraparenchymal hemorrhage. The brain parenchymal signal is normal. No mass lesion or midline shift. No hydrocephalus or extra-axial fluid collection. The midline structures are normal. No age advanced or lobar predominant atrophy. Vascular: Major intracranial arterial and venous sinus flow voids are preserved. Unchanged left  parietal focus of chronic microhemorrhage. No cortical vein thrombosis identified. -MRV:  The MRV portion of the study is degraded by motion. --Superior sagittal sinus: Within the aspect of the superior sagittal sinus near the skull vertex, there is an area of absent flow related enhancement that is unchanged compared to the study of 08/05/2014. This corresponds to a filling defect within the superior sagittal sinus on the concomitant CTA. The remainder of the superior sagittal sinus is normal. --Straight sinus: Normal. --  Inferior sagittal sinus, vein of Galen and internal cerebral veins: Normal. --Transverse sinuses: Normal. --Sigmoid sinuses: Normal. --Visualized jugular veins: Normal. Skull and upper cervical spine: The visualized skull base, calvarium, upper cervical spine and extracranial soft tissues are normal. Sinuses/Orbits: No fluid levels or advanced mucosal thickening. No mastoid effusion. Normal orbits. IMPRESSION: 1. No acute intracranial abnormality. 2. Area of absent flow related enhancement within the posterior vertex area of the superior sagittal sinus is unchanged compared to 08/05/2014. This may simply be a arachnoid granulation, though residual thrombus remains a possibility. There is no increase or new evidence of venous sinus thrombosis. 3. For future evaluation for potential dural venous sinus thrombosis, CT venography is superior to MR venography. Electronically Signed   By: Deatra Robinson M.D.   On: 03/25/2016 02:57   Mr Shoulder Right Wo Contrast  Result Date: 03/26/2016 CLINICAL DATA:  Right shoulder numbness and pain radiating down right arm. Numbness and pain has been present x2 weeks. EXAM: MRI OF THE RIGHT SHOULDER WITHOUT CONTRAST TECHNIQUE: Multiplanar, multisequence MR imaging of the shoulder was performed. No intravenous contrast was administered. COMPARISON:  Radiographs of the right shoulder from 03/25/2016 FINDINGS: Rotator cuff: No rotator cuff tear noted. Mild supraspinous  tendinopathy Muscles: No muscle atrophy. No abnormal signal of the muscles of the rotator cuff. Biceps long head:  Intact Acromioclavicular Joint: Normal acromioclavicular joint. Type I acromion. Trace subacromial/subdeltoid bursal edema. Glenohumeral Joint: No joint effusion. No chondral defect. Labrum: Grossly intact, but evaluation is limited by lack of intraarticular fluid. Bones:  No marrow abnormality, fracture or dislocation. Other: None. IMPRESSION: No rotator cuff tear noted. Mild supraspinatus tendinopathy. Trace subacromial/subdeltoid bursal edema. No acute osseous abnormality. Electronically Signed   By: Tollie Eth M.D.   On: 03/26/2016 18:16   Ct Head Code Stroke W/o Cm  Result Date: 03/25/2016 CLINICAL DATA:  Code stroke.  Right-sided weakness EXAM: CT HEAD WITHOUT CONTRAST TECHNIQUE: Contiguous axial images were obtained from the base of the skull through the vertex without intravenous contrast. COMPARISON:  None. FINDINGS: Brain: No mass lesion, intraparenchymal hemorrhage or extra-axial collection. No evidence of acute cortical infarct. Brain parenchyma and CSF-containing spaces are normal for age. Vascular: No hyperdense vessel or unexpected calcification. Skull: Normal visualized skull base, calvarium and extracranial soft tissues. Sinuses/Orbits: No sinus fluid levels or advanced mucosal thickening. No mastoid effusion. Normal orbits. ASPECTS [facility] Stroke Program Early CT Score) - Ganglionic level infarction (caudate, lentiform nuclei, internal capsule, insula, M1-M3 cortex): 7 - Supraganglionic infarction (M4-M6 cortex): 3 Total score (0-10 with 10 being normal): 10 IMPRESSION: 1. Normal head CT. 2. ASPECTS is 10. These results were called by telephone at the time of interpretation on 03/25/2016 at 1:02 am to Dr. Caryl Pina, who verbally acknowledged these results. Electronically Signed   By: Deatra Robinson M.D.   On: 03/25/2016 01:04    Discharge Exam: Vitals:   03/27/16 0102  03/27/16 0500  BP: 105/72 118/69  Pulse: (!) 57 62  Resp: 16 16  Temp: 98.3 F (36.8 C) 98.5 F (36.9 C)   Vitals:   03/26/16 0934 03/26/16 2100 03/27/16 0102 03/27/16 0500  BP: 111/66 111/69 105/72 118/69  Pulse: 68 62 (!) 57 62  Resp: 17  16 16   Temp: 98.1 F (36.7 C) 98 F (36.7 C) 98.3 F (36.8 C) 98.5 F (36.9 C)  TempSrc: Oral Oral Oral Oral  SpO2: 100%  99% 100%  Weight:      Height:  General: Pt is alert, follows commands appropriately, not in acute distress Cardiovascular: Regular rate and rhythm, S1/S2 +, no murmurs, no rubs, no gallops Respiratory: Clear to auscultation bilaterally, no wheezing, no crackles, no rhonchi Abdominal: Soft, non tender, non distended, bowel sounds +, no guarding   Discharge Instructions  Discharge Instructions    Ambulatory referral to Physical Therapy    Complete by:  As directed    Diet - low sodium heart healthy    Complete by:  As directed    Increase activity slowly    Complete by:  As directed      Allergies as of 03/27/2016      Reactions   Bee Venom Swelling      Medication List    STOP taking these medications   levETIRAcetam 500 MG tablet Commonly known as:  KEPPRA   methadone 5 MG tablet Commonly known as:  DOLOPHINE   polyethylene glycol packet Commonly known as:  MIRALAX / GLYCOLAX     TAKE these medications   atorvastatin 20 MG tablet Commonly known as:  LIPITOR Take 1 tablet (20 mg total) by mouth daily.   ferrous sulfate 325 (65 FE) MG tablet Take 1 tablet (325 mg total) by mouth 3 (three) times daily with meals.   gabapentin 100 MG capsule Commonly known as:  NEURONTIN Take 1 capsule (100 mg total) by mouth 3 (three) times daily.   ondansetron 4 MG tablet Commonly known as:  ZOFRAN Take 1 tablet (4 mg total) by mouth every 6 (six) hours as needed for nausea.   Oxycodone HCl 10 MG Tabs Take 10 mg by mouth every 6 (six) hours.   oxyCODONE-acetaminophen 5-325 MG tablet Commonly  known as:  PERCOCET/ROXICET Take 1 tablet by mouth every 3 (three) hours as needed (breakthrough pain).   rivaroxaban 20 MG Tabs tablet Commonly known as:  XARELTO Take 1 tablet (20 mg total) by mouth daily with supper. What changed:  Another medication with the same name was removed. Continue taking this medication, and follow the directions you see here.       Follow-up Information    [facility] Follow up.   Specialty:  Rehabilitation Why:  They will contact you for the first appointment. Contact information: [address] [id number] mc [address] [place] [address] [phone]       Lora Paula, MD Follow up.   Specialty:  Family Medicine Contact information: [address] [place] [address] [phone]            The results of significant diagnostics from this hospitalization (including imaging, microbiology, ancillary and laboratory) are listed below for reference.     Microbiology: No results found for this or any previous visit (from the past 240 hour(s)).   Labs: Basic Metabolic Panel:  Recent Labs Lab 03/25/16 0037 03/25/16 0049 03/26/16 0211  NA 139 139 137  K 3.5 3.5 3.5  CL 109 106 107  CO2 23  --  22  GLUCOSE 97 103* 94  BUN 6 5* <5*  CREATININE 0.71 0.80 0.77  CALCIUM 9.1  --  8.1*   Liver Function Tests:  Recent Labs Lab 03/25/16 0037  AST 18  ALT 20  ALKPHOS 51  BILITOT 1.9*  PROT 6.3*  ALBUMIN 3.8   CBC:  Recent Labs Lab 03/25/16 0037 03/25/16 0049 03/26/16 0211  WBC 5.7  --  5.0  NEUTROABS 2.9  --   --   HGB 14.9 13.6 12.8  HCT  41.6 40.0 37.0  MCV 91.2  --  91.6  PLT 183  --  147*   Cardiac Enzymes:  Recent Labs Lab 03/25/16 0448 03/25/16 0954 03/25/16 1617  TROPONINI <0.03 <0.03 <0.03    SIGNED: Time coordinating discharge: 30 minutes  Debbora Presto, MD  [facility] 03/27/2016, 8:32 AM Pager [phone]  If 7PM-7AM, please  contact night-coverage [email or web address] Password [email or web address]

## 2016-03-27 NOTE — Progress Notes (Signed)
Orthopedic Tech Progress Note Patient Details:  Victoria Alvarez November 22, 1990 [id number]  Ortho Devices Type of Ortho Device: Sling immobilizer Ortho Device/Splint Location: rue Ortho Device/Splint Interventions: Application   Nikki Dom 03/27/2016, 7:41 AM

## 2016-07-24 ENCOUNTER — Encounter: Payer: Self-pay | Admitting: Family Medicine

## 2016-09-16 ENCOUNTER — Telehealth: Payer: Self-pay | Admitting: *Deleted

## 2016-09-16 ENCOUNTER — Ambulatory Visit: Payer: Medicaid Other

## 2016-09-16 NOTE — Telephone Encounter (Signed)
Georgann called and left a message this am she is supposed to be coming in for a nurse visit for a test but her car won't start and she has called but can't get thru on the appointment line- keeps getting cut off. I took her contact information and took to registar to call patient and reschedule.

## 2016-09-19 ENCOUNTER — Ambulatory Visit: Payer: Medicaid Other

## 2016-09-25 ENCOUNTER — Ambulatory Visit (INDEPENDENT_AMBULATORY_CARE_PROVIDER_SITE_OTHER): Payer: Medicaid Other

## 2016-09-25 ENCOUNTER — Encounter: Payer: Self-pay | Admitting: General Practice

## 2016-09-25 DIAGNOSIS — Z3201 Encounter for pregnancy test, result positive: Secondary | ICD-10-CM | POA: Diagnosis not present

## 2016-09-25 LAB — POCT PREGNANCY, URINE: Preg Test, Ur: POSITIVE — AB

## 2016-09-25 NOTE — Progress Notes (Signed)
Patient presented to the office today for pregnancy test. Test does confirms she is pregnant around 8 weeks. Patient was informed of results and become tearful. I advised patient if this is not a desire pregnancy give herself some time to process things. If she does change her mind she can start prenatal vitamins.

## 2017-07-31 ENCOUNTER — Telehealth: Payer: Self-pay | Admitting: *Deleted

## 2017-07-31 ENCOUNTER — Ambulatory Visit: Payer: Medicaid Other | Admitting: Neurology

## 2017-07-31 NOTE — Telephone Encounter (Signed)
Pt canceled appt same day. 

## 2017-08-01 ENCOUNTER — Encounter: Payer: Self-pay | Admitting: Neurology

## 2017-10-01 ENCOUNTER — Encounter: Payer: Self-pay | Admitting: *Deleted

## 2017-10-02 ENCOUNTER — Encounter: Payer: Self-pay | Admitting: Neurology

## 2017-10-02 ENCOUNTER — Ambulatory Visit: Payer: Medicaid Other | Admitting: Neurology

## 2017-10-02 VITALS — BP 117/82 | HR 76 | Ht 63.0 in | Wt 186.0 lb

## 2017-10-02 DIAGNOSIS — R51 Headache with orthostatic component, not elsewhere classified: Secondary | ICD-10-CM

## 2017-10-02 DIAGNOSIS — G08 Intracranial and intraspinal phlebitis and thrombophlebitis: Secondary | ICD-10-CM

## 2017-10-02 DIAGNOSIS — G43709 Chronic migraine without aura, not intractable, without status migrainosus: Secondary | ICD-10-CM

## 2017-10-02 DIAGNOSIS — G43009 Migraine without aura, not intractable, without status migrainosus: Secondary | ICD-10-CM | POA: Diagnosis not present

## 2017-10-02 DIAGNOSIS — G43109 Migraine with aura, not intractable, without status migrainosus: Secondary | ICD-10-CM

## 2017-10-02 DIAGNOSIS — H539 Unspecified visual disturbance: Secondary | ICD-10-CM

## 2017-10-02 DIAGNOSIS — G8929 Other chronic pain: Secondary | ICD-10-CM

## 2017-10-02 DIAGNOSIS — R519 Headache, unspecified: Secondary | ICD-10-CM

## 2017-10-02 DIAGNOSIS — IMO0002 Reserved for concepts with insufficient information to code with codable children: Secondary | ICD-10-CM

## 2017-10-02 MED ORDER — GABAPENTIN 300 MG PO CAPS: 300.0000 mg | ORAL_CAPSULE | Freq: Three times a day (TID) | ORAL | 6 refills | Status: DC

## 2017-10-02 MED ORDER — METHYLPREDNISOLONE 4 MG PO TBPK
ORAL_TABLET | ORAL | 0 refills | Status: DC
Start: 2017-10-02 — End: 2017-11-23

## 2017-10-02 NOTE — Patient Instructions (Signed)
Gabapentin 2-3x a day 6 days steroids MRIs of the brain   Migraine Headache A migraine headache is a very strong throbbing pain on one side or both sides of your head. Migraines can also cause other symptoms. Talk with your doctor about what things may bring on (trigger) your migraine headaches. Follow these instructions at home: Medicines  Take over-the-counter and prescription medicines only as told by your doctor.  Do not drive or use heavy machinery while taking prescription pain medicine.  To prevent or treat constipation while you are taking prescription pain medicine, your doctor may recommend that you: ? Drink enough fluid to keep your pee (urine) clear or pale yellow. ? Take over-the-counter or prescription medicines. ? Eat foods that are high in fiber. These include fresh fruits and vegetables, whole grains, and beans. ? Limit foods that are high in fat and processed sugars. These include fried and sweet foods. Lifestyle  Avoid alcohol.  Do not use any products that contain nicotine or tobacco, such as cigarettes and e-cigarettes. If you need help quitting, ask your doctor.  Get at least 8 hours of sleep every night.  Limit your stress. General instructions   Keep a journal to find out what may bring on your migraines. For example, write down: ? What you eat and drink. ? How much sleep you get. ? Any change in what you eat or drink. ? Any change in your medicines.  If you have a migraine: ? Avoid things that make your symptoms worse, such as bright lights. ? It may help to lie down in a dark, quiet room. ? Do not drive or use heavy machinery. ? Ask your doctor what activities are safe for you.  Keep all follow-up visits as told by your doctor. This is important. Contact a doctor if:  You get a migraine that is different or worse than your usual migraines. Get help right away if:  Your migraine gets very bad.  You have a fever.  You have a stiff  neck.  You have trouble seeing.  Your muscles feel weak or like you cannot control them.  You start to lose your balance a lot.  You start to have trouble walking.  You pass out (faint). This information is not intended to replace advice given to you by your health care provider. Make sure you discuss any questions you have with your health care provider. Document Released: 12/05/2007 Document Revised: 09/15/2015 Document Reviewed: 08/14/2015 Elsevier Interactive Patient Education  2018 [employer].

## 2017-10-02 NOTE — Progress Notes (Signed)
[facility]    Provider:  Dr Lucia Gaskins Referring Provider: Roger Kill Primary Care Physician:  Dessa Phi, MD  CC: Migraines  HPI:  Victoria Alvarez is a 27 y.o. female here as a referral from Dr. Armen Pickup for Migraines.  Past medical history hypercoagulable disorder, depression, chronic pain, cerebral venous sinus thrombosis , obesity, portal vein thrombosis, pulmonary embolism. with infarction. symptoms come on slowly, she has a history of headaches. Her migraines are on the right side of the head, she can't function, worsening in the setting of stress and children. Startes behind the right eye and spreads, more in the front, pounding and pulsating and throbbing, +phono/photophobia, triggers are light and sound, no significant nausea with the headaches but she gets dizzy. She sees black spots in her vision when she has the headaches and when they are very severe. She is compliant on her Xarelto. She is having the headaches 3x a week, can be severe, sleep helps, on average 6 hours. No FHx migraines. Phenergan helps. Ibuprofen doesn;t help, tylenol doesn;t help. She has blurry vision and dizziness and had a loss of consciousness due to severe headache.  She denies weakness. No weakness. No other focal neurologic deficits, associated symptoms, inciting events or modifiable factors.  Reviewed notes, labs and imaging from outside physicians, which showed:   Reviewed referring physician notes the patient is seen for chronic pain syndrome and she is in the maintenance phase she is treated on morphine daily and notes improvement in physical functioning.  She has been completely compliant.  Concerning headache, location is primarily temporal and behind the eyes, she has had prior headaches similar to this but the frequency is quite variable and duration is usually quite variable.  Severe aching and pounding, photophobia and phonophobia, headache is exacerbated with exposure to bright  light and loud noise, improved with dim lights quite surroundings NSAIDs and narcotics.  Pertinent past medical history includes CVA.  Review of Systems: Patient complains of symptoms per HPI as well as the following symptoms headache, chronic pain. Pertinent negatives and positives per HPI. All others negative.   Social History   Socioeconomic History  . Marital status: Single    Spouse name: Not on file  . Number of children: 2  . Years of education: 9  . Highest education level: Not on file  Occupational History  . Occupation: homemaker  Social Needs  . Financial resource strain: Not on file  . Food insecurity:    Worry: Not on file    Inability: Not on file  . Transportation needs:    Medical: Not on file    Non-medical: Not on file  Tobacco Use  . Smoking status: Current Every Day Smoker    Packs/day: 0.25    Years: 8.00    Pack years: 2.00    Types: Cigarettes  . Smokeless tobacco: Never Used  . Tobacco comment: cutting back  Substance and Sexual Activity  . Alcohol use: No  . Drug use: No    Comment: Pt on Methadone "was on pain pills; weaned off while I was pregnant so it would be safer"  . Sexual activity: Yes    Birth control/protection: None  Lifestyle  . Physical activity:    Days per week: Not on file    Minutes per session: Not on file  . Stress: Not on file  Relationships  . Social connections:    Talks on phone: Not on file    Gets together: Not on file  Attends religious service: Not on file    Active member of club or organization: Not on file    Attends meetings of clubs or organizations: Not on file    Relationship status: Not on file  . Intimate partner violence:    Fear of current or ex partner: Not on file    Emotionally abused: Not on file    Physically abused: Not on file    Forced sexual activity: Not on file  Other Topics Concern  . Not on file  Social History Narrative   Has significant other.  Children 2.  Occupation: Best boy.   Right handed   Caffeine use - none    Family History  Problem Relation Age of Onset  . Diabetes Mother   . Heart disease Mother   . Anxiety disorder Mother   . Depression Mother   . Hypertension Father   . CAD Father   . Diabetes Maternal Grandmother   . Hypertension Maternal Grandmother   . Stroke Maternal Grandmother   . Hypothyroidism Maternal Grandmother   . Diabetes Maternal Grandfather   . Hypertension Maternal Grandfather   . Diabetes Paternal Grandmother   . Diabetes Paternal Grandfather   . Hypertension Paternal Grandfather     Past Medical History:  Diagnosis Date  . Anemia 07/2014   Microcytic  . Asthma   . Cerebral venous sinus thrombosis   . Chronic pelvic pain in female   . Depression 2007  . Headache(784.0)   . Hypercoagulable state, primary (HCC) 07/2014   . no clear inherited or acquired cause for the patient's coagulopathy  . Interstitial cystitis    pain from this was reason for starting opioids age 50.   . Miscarriage 2005  . Morbid obesity (HCC)    BMI 37 in 11/2014.  . Non-compliance    with anticoagulant  . Numbness    right side  . Opioid dependence (HCC)    to Rx opioids.  switched to Methadone after second child born in 04/2014  . 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 Pneumonia 03/2014  . 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 Preeclampsia 2012  . Pulmonary embolism (HCC)   . Pyelonephritis affecting pregnancy in first trimester July 2015  . Seizures (HCC)   . Stroke [facility]) 05/2014   presented with right sided weakness.     Past Surgical History:  Procedure Laterality Date  . ABDOMINAL SURGERY    . CESAREAN SECTION N/A 05/06/2014   Procedure: CESAREAN SECTION;  Surgeon: Levie Heritage, DO;  Location: [facility];  Service: Obstetrics;  Laterality: N/A;  . COLONOSCOPY WITH PROPOFOL N/A 11/18/2014   Procedure: COLONOSCOPY WITH PROPOFOL;  Surgeon: Rachael Fee, MD;  Location: [facility] END[facility]COPY;  Service: Endoscopy;  Laterality: N/A;  . INGUINAL  HERNIA REPAIR Left 2008  . VAGINA SURGERY  ~ 2011   benign vaginal tumor removed at [facility].     Current Outpatient Medications  Medication Sig Dispense Refill  . ondansetron (ZOFRAN) 4 MG tablet Take 4 mg by mouth every 6 (six) hours as needed for nausea or vomiting.    . OXYCODONE ER PO Take by mouth.    . promethazine (PHENERGAN) 25 MG tablet Take 25 mg by mouth at bedtime as needed.     . Rivaroxaban (XARELTO) 15 MG TABS tablet Take 15 mg by mouth 2 (two) times daily with a meal.    . gabapentin (NEURONTIN) 300 MG capsule Take 1 capsule (300 mg total) by mouth 3 (three)  times daily. 90 capsule 6  . methylPREDNISolone (MEDROL DOSEPAK) 4 MG TBPK tablet Take pill each morning with food x 6 days 21 tablet 0   No current facility-administered medications for this visit.     Allergies as of 10/02/2017 - Review Complete 10/02/2017  Allergen Reaction Noted  . Bee venom Swelling 06/16/2014    Vitals: BP 117/82   Pulse 76   Ht 5\' 3"  (1.6 m)   Wt 186 lb (84.4 kg)   BMI 32.95 kg/m  Last Weight:  Wt Readings from Last 1 Encounters:  10/02/17 186 lb (84.4 kg)   Last Height:   Ht Readings from Last 1 Encounters:  10/02/17 5\' 3"  (1.6 m)    Physical exam: Exam: Gen: NAD, conversant, well nourised, obese, well groomed                     CV: RRR, no MRG. No Carotid Bruits. No peripheral edema, warm, nontender Eyes: Conjunctivae clear without exudates or hemorrhage  Neuro: Detailed Neurologic Exam  Speech:    Speech is normal; fluent and spontaneous with normal comprehension.  Cognition:    The patient is oriented to person, place, and time;     recent and remote memory intact;     language fluent;     normal attention, concentration,     fund of knowledge Cranial Nerves:    The pupils are equal, round, and reactive to light. The fundi are normal and spontaneous venous pulsations are present. Visual fields are full to finger confrontation. Extraocular movements are intact.  Trigeminal sensation is intact and the muscles of mastication are normal. The face is symmetric. The palate elevates in the midline. Hearing intact. Voice is normal. Shoulder shrug is normal. The tongue has normal motion without fasciculations.   Coordination:    Normal finger to nose and heel to shin. Normal rapid alternating movements.   Gait:    Heel-toe and tandem gait are normal.   Motor Observation:    No asymmetry, no atrophy, and no involuntary movements noted. Tone:    Normal muscle tone.    Posture:    Posture is normal. normal erect    Strength:    Strength is V/V in the upper and lower limbs.      Sensation: intact to LT     Reflex Exam:  DTR's:    Deep tendon reflexes in the upper and lower extremities are normal bilaterally.   Toes:    The toes are downgoing bilaterally.   Clonus:    Clonus is absent.      Assessment/Plan:  27 year old with past medical history hypercoagulable disorder, more than likely these are migraine,depression, chronic pain, cerebral venous sinus thrombosis , obesity, portal vein thrombosis, pulmonary embolism. with infarction.  Patient is here for evaluation of  chronic intractable headache.  Worsening migraines with and without aura however given her concerning symptoms Of positional headaches with vision changes, dizziness, loss of consciousness and her past medical history of hypercoagulable disorder and cerebral venous sinus thrombosis with infarction need imaging of the brain to rule out other disorders.    MRI brain w/wo contrast and MRV of the head  Patient reports she had good results with Gabapentin in the past for headache, can prescribe steroid taper  Orders Placed This Encounter  Procedures  . MR BRAIN W WO CONTRAST  . MR MRV HEAD WO CM  . Comprehensive metabolic panel  . CBC   Meds ordered this  encounter  Medications  . methylPREDNISolone (MEDROL DOSEPAK) 4 MG TBPK tablet    Sig: Take pill each morning with food x 6  days    Dispense:  21 tablet    Refill:  0  . gabapentin (NEURONTIN) 300 MG capsule    Sig: Take 1 capsule (300 mg total) by mouth 3 (three) times daily.    Dispense:  90 capsule    Refill:  6    Discussed: To prevent or relieve headaches, try the following: Cool Compress. Lie down and place a cool compress on your head.  Avoid headache triggers. If certain foods or odors seem to have triggered your migraines in the past, avoid them. A headache diary might help you identify triggers.  Include physical activity in your daily routine. Try a daily walk or other moderate aerobic exercise.  Manage stress. Find healthy ways to cope with the stressors, such as delegating tasks on your to-do list.  Practice relaxation techniques. Try deep breathing, yoga, massage and visualization.  Eat regularly. Eating regularly scheduled meals and maintaining a healthy diet might help prevent headaches. Also, drink plenty of fluids.  Follow a regular sleep schedule. Sleep deprivation might contribute to headaches Consider biofeedback. With this mind-body technique, you learn to control certain bodily functions - such as muscle tension, heart rate and blood pressure - to prevent headaches or reduce headache pain.    Proceed to emergency room if you experience new or worsening symptoms or symptoms do not resolve, if you have new neurologic symptoms or if headache is severe, or for any concerning symptom.   Provided education and documentation from American headache Society toolbox including articles on: chronic migraine medication overuse headache, chronic migraines, prevention of migraines, behavioral and other nonpharmacologic treatments for headache.  Discussed:  There is increased risk for stroke in women with migraine with aura and a contraindication for the combined contraceptive pill for use by women who have migraine with aura, which is in line with World Health Organisation recommendations. The risk for  women with migraine without aura is lower and other risk factors like smoking are far more likely to increase stroke risk than migraine. There is a recommendation for no smoking and for the use of low estrogen or progestogen only pills particularly for women with migraine with aura. It is important however that women with migraine who are taking the pill do not decide to suddenly stop taking it without discussing this with their doctor. Please discuss with her OB/GYN.  Naomie Dean, MD  [facility] [address] [place], [place] [address]  Phone [phone] Fax [phone]

## 2017-10-06 ENCOUNTER — Encounter: Payer: Self-pay | Admitting: Neurology

## 2017-10-06 DIAGNOSIS — IMO0002 Reserved for concepts with insufficient information to code with codable children: Secondary | ICD-10-CM | POA: Insufficient documentation

## 2017-10-06 DIAGNOSIS — G43709 Chronic migraine without aura, not intractable, without status migrainosus: Secondary | ICD-10-CM | POA: Insufficient documentation

## 2017-10-12 ENCOUNTER — Ambulatory Visit
Admission: RE | Admit: 2017-10-12 | Discharge: 2017-10-12 | Disposition: A | Discharge status: Home / Self Care | Payer: Medicaid Other | Source: Ambulatory Visit | Attending: Neurology | Admitting: Neurology

## 2017-10-12 DIAGNOSIS — R519 Headache, unspecified: Secondary | ICD-10-CM

## 2017-10-12 DIAGNOSIS — R51 Headache with orthostatic component, not elsewhere classified: Secondary | ICD-10-CM

## 2017-10-12 DIAGNOSIS — G08 Intracranial and intraspinal phlebitis and thrombophlebitis: Secondary | ICD-10-CM

## 2017-10-12 DIAGNOSIS — G8929 Other chronic pain: Secondary | ICD-10-CM

## 2017-10-12 DIAGNOSIS — H539 Unspecified visual disturbance: Secondary | ICD-10-CM

## 2017-10-12 MED ORDER — GADOBENATE DIMEGLUMINE 529 MG/ML IV SOLN
15.0000 mL | Freq: Once | INTRAVENOUS | Status: AC | PRN
  Administered 2017-10-12: 15 mL via INTRAVENOUS

## 2017-10-13 ENCOUNTER — Telehealth: Payer: Self-pay | Admitting: Neurology

## 2017-10-13 NOTE — Telephone Encounter (Signed)
See result note from earier today thanks

## 2017-10-13 NOTE — Telephone Encounter (Signed)
Pt requesting a call to discuss her MRI results

## 2017-10-16 ENCOUNTER — Other Ambulatory Visit: Payer: Self-pay

## 2017-11-23 ENCOUNTER — Emergency Department (HOSPITAL_COMMUNITY): Payer: Medicaid Other

## 2017-11-23 ENCOUNTER — Other Ambulatory Visit: Payer: Self-pay

## 2017-11-23 ENCOUNTER — Encounter (HOSPITAL_COMMUNITY): Payer: Self-pay | Admitting: Emergency Medicine

## 2017-11-23 ENCOUNTER — Observation Stay (HOSPITAL_COMMUNITY)
Admission: EM | Admit: 2017-11-23 | Discharge: 2017-11-24 | Disposition: A | Discharge status: Home / Self Care | Payer: Medicaid Other | Attending: Student in an Organized Health Care Education/Training Program | Admitting: Student in an Organized Health Care Education/Training Program

## 2017-11-23 DIAGNOSIS — Z8673 Personal history of transient ischemic attack (TIA), and cerebral infarction without residual deficits: Secondary | ICD-10-CM

## 2017-11-23 DIAGNOSIS — G8929 Other chronic pain: Secondary | ICD-10-CM | POA: Diagnosis not present

## 2017-11-23 DIAGNOSIS — J189 Pneumonia, unspecified organism: Secondary | ICD-10-CM | POA: Diagnosis not present

## 2017-11-23 DIAGNOSIS — R079 Chest pain, unspecified: Secondary | ICD-10-CM | POA: Diagnosis present

## 2017-11-23 DIAGNOSIS — F17210 Nicotine dependence, cigarettes, uncomplicated: Secondary | ICD-10-CM | POA: Insufficient documentation

## 2017-11-23 DIAGNOSIS — Z79899 Other long term (current) drug therapy: Secondary | ICD-10-CM

## 2017-11-23 DIAGNOSIS — E876 Hypokalemia: Secondary | ICD-10-CM | POA: Insufficient documentation

## 2017-11-23 DIAGNOSIS — J1289 Other viral pneumonia: Secondary | ICD-10-CM | POA: Diagnosis not present

## 2017-11-23 DIAGNOSIS — Z86711 Personal history of pulmonary embolism: Secondary | ICD-10-CM | POA: Insufficient documentation

## 2017-11-23 DIAGNOSIS — G43909 Migraine, unspecified, not intractable, without status migrainosus: Secondary | ICD-10-CM | POA: Diagnosis not present

## 2017-11-23 DIAGNOSIS — Z86718 Personal history of other venous thrombosis and embolism: Secondary | ICD-10-CM

## 2017-11-23 DIAGNOSIS — Z7901 Long term (current) use of anticoagulants: Secondary | ICD-10-CM | POA: Insufficient documentation

## 2017-11-23 DIAGNOSIS — Z91030 Bee allergy status: Secondary | ICD-10-CM

## 2017-11-23 DIAGNOSIS — Z9049 Acquired absence of other specified parts of digestive tract: Secondary | ICD-10-CM

## 2017-11-23 DIAGNOSIS — K5090 Crohn's disease, unspecified, without complications: Secondary | ICD-10-CM | POA: Diagnosis not present

## 2017-11-23 DIAGNOSIS — Z79891 Long term (current) use of opiate analgesic: Secondary | ICD-10-CM

## 2017-11-23 HISTORY — DX: Intracranial and intraspinal phlebitis and thrombophlebitis: G08

## 2017-11-23 LAB — CBC
HCT: 42.3 % (ref 36.0–46.0)
Hemoglobin: 14.8 g/dL (ref 12.0–15.0)
MCH: 31.4 pg (ref 26.0–34.0)
MCHC: 35 g/dL (ref 30.0–36.0)
MCV: 89.6 fL (ref 78.0–100.0)
Platelets: 171 10*3/uL (ref 150–400)
RBC: 4.72 MIL/uL (ref 3.87–5.11)
RDW: 11.8 % (ref 11.5–15.5)
WBC: 7 10*3/uL (ref 4.0–10.5)

## 2017-11-23 LAB — I-STAT BETA HCG BLOOD, ED (MC, WL, AP ONLY): I-stat hCG, quantitative: <5 m[IU]/mL (ref <5)

## 2017-11-23 LAB — BASIC METABOLIC PANEL
Anion gap: 11 (ref 5–15)
BUN: <5 mg/dL — ABNORMAL LOW (ref 6–20)
CO2: 23 mmol/L (ref 22–32)
Calcium: 9.7 mg/dL (ref 8.9–10.3)
Chloride: 102 mmol/L (ref 98–111)
Creatinine, Ser: 0.86 mg/dL (ref 0.44–1.00)
GFR calc Af Amer: >60 mL/min (ref >60)
GFR calc non Af Amer: >60 mL/min (ref >60)
Glucose, Bld: 96 mg/dL (ref 70–99)
Potassium: 3 mmol/L — ABNORMAL LOW (ref 3.5–5.1)
Sodium: 136 mmol/L (ref 135–145)

## 2017-11-23 LAB — I-STAT TROPONIN, ED: Troponin i, poc: 0.01 ng/mL (ref 0.00–0.08)

## 2017-11-23 MED ORDER — IOPAMIDOL (ISOVUE-370) INJECTION 76%
100.0000 mL | Freq: Once | INTRAVENOUS | Status: AC | PRN
  Administered 2017-11-23: 48 mL via INTRAVENOUS

## 2017-11-23 MED ORDER — IPRATROPIUM-ALBUTEROL 0.5-2.5 (3) MG/3ML IN SOLN: 3.0000 mL | Freq: Four times a day (QID) | RESPIRATORY_TRACT | Status: DC | PRN

## 2017-11-23 MED ORDER — POTASSIUM CHLORIDE CRYS ER 20 MEQ PO TBCR
40.0000 meq | EXTENDED_RELEASE_TABLET | Freq: Once | ORAL | Status: AC
  Administered 2017-11-23: 40 meq via ORAL
  Filled 2017-11-23: qty 2

## 2017-11-23 MED ORDER — RIVAROXABAN 20 MG PO TABS
20.0000 mg | ORAL_TABLET | Freq: Every day | ORAL | Status: DC
  Administered 2017-11-23: 20 mg via ORAL
  Filled 2017-11-23: qty 1

## 2017-11-23 MED ORDER — ALBUTEROL SULFATE HFA 108 (90 BASE) MCG/ACT IN AERS
2.0000 | INHALATION_SPRAY | Freq: Once | RESPIRATORY_TRACT | Status: AC
  Administered 2017-11-23: 2 via RESPIRATORY_TRACT
  Filled 2017-11-23: qty 6.7

## 2017-11-23 MED ORDER — AZITHROMYCIN 250 MG PO TABS
250.0000 mg | ORAL_TABLET | Freq: Every day | ORAL | Status: DC
  Administered 2017-11-24: 250 mg via ORAL
  Filled 2017-11-23: qty 1

## 2017-11-23 MED ORDER — OXYCODONE HCL 5 MG PO TABS
15.0000 mg | ORAL_TABLET | Freq: Once | ORAL | Status: AC
  Administered 2017-11-23: 15 mg via ORAL
  Filled 2017-11-23: qty 3

## 2017-11-23 MED ORDER — IPRATROPIUM-ALBUTEROL 0.5-2.5 (3) MG/3ML IN SOLN
3.0000 mL | Freq: Four times a day (QID) | RESPIRATORY_TRACT | Status: DC
  Administered 2017-11-24 (×2): 3 mL via RESPIRATORY_TRACT
  Filled 2017-11-23 (×2): qty 3

## 2017-11-23 MED ORDER — SODIUM CHLORIDE 0.9 % IV SOLN
1.0000 g | Freq: Once | INTRAVENOUS | Status: AC
  Administered 2017-11-23: 1 g via INTRAVENOUS
  Filled 2017-11-23: qty 10

## 2017-11-23 MED ORDER — ACETYLCYSTEINE 10 % IN SOLN
2.0000 mL | Freq: Two times a day (BID) | RESPIRATORY_TRACT | Status: DC
  Filled 2017-11-23 (×2): qty 2

## 2017-11-23 MED ORDER — ACETAMINOPHEN 650 MG RE SUPP: 650.0000 mg | Freq: Four times a day (QID) | RECTAL | Status: DC | PRN

## 2017-11-23 MED ORDER — POLYETHYLENE GLYCOL 3350 17 G PO PACK: 17.0000 g | PACK | Freq: Every day | ORAL | Status: DC | PRN

## 2017-11-23 MED ORDER — ACETYLCYSTEINE 20 % IN SOLN
3.0000 mL | Freq: Two times a day (BID) | RESPIRATORY_TRACT | Status: DC
  Administered 2017-11-24 (×2): 3 mL via RESPIRATORY_TRACT
  Filled 2017-11-23 (×2): qty 4

## 2017-11-23 MED ORDER — SODIUM CHLORIDE 0.9 % IV SOLN
1.0000 g | INTRAVENOUS | Status: DC
  Filled 2017-11-23: qty 10

## 2017-11-23 MED ORDER — LORAZEPAM 2 MG/ML IJ SOLN
0.5000 mg | Freq: Once | INTRAMUSCULAR | Status: AC
  Administered 2017-11-23: 0.5 mg via INTRAVENOUS
  Filled 2017-11-23: qty 1

## 2017-11-23 MED ORDER — SODIUM CHLORIDE 0.9 % IV SOLN
500.0000 mg | Freq: Once | INTRAVENOUS | Status: AC
  Administered 2017-11-23: 500 mg via INTRAVENOUS
  Filled 2017-11-23: qty 500

## 2017-11-23 MED ORDER — OXYCODONE HCL 5 MG PO TABS
15.0000 mg | ORAL_TABLET | ORAL | Status: DC | PRN
  Administered 2017-11-24: 15 mg via ORAL
  Filled 2017-11-23: qty 3

## 2017-11-23 MED ORDER — DICLOFENAC SODIUM 1 % TD GEL
2.0000 g | Freq: Three times a day (TID) | TRANSDERMAL | Status: DC
  Administered 2017-11-24 (×3): 2 g via TOPICAL
  Filled 2017-11-23: qty 100

## 2017-11-23 MED ORDER — ONDANSETRON HCL 4 MG PO TABS: 4.0000 mg | ORAL_TABLET | Freq: Four times a day (QID) | ORAL | Status: DC | PRN

## 2017-11-23 MED ORDER — ACETAMINOPHEN 325 MG PO TABS: 650.0000 mg | ORAL_TABLET | Freq: Four times a day (QID) | ORAL | Status: DC | PRN

## 2017-11-23 MED ORDER — POTASSIUM CHLORIDE CRYS ER 20 MEQ PO TBCR
40.0000 meq | EXTENDED_RELEASE_TABLET | Freq: Once | ORAL | Status: AC
  Administered 2017-11-24: 40 meq via ORAL
  Filled 2017-11-23: qty 2

## 2017-11-23 MED ORDER — ONDANSETRON HCL 4 MG/2ML IJ SOLN
4.0000 mg | Freq: Four times a day (QID) | INTRAMUSCULAR | Status: DC | PRN
  Administered 2017-11-24: 4 mg via INTRAVENOUS
  Filled 2017-11-23: qty 2

## 2017-11-23 MED ORDER — GABAPENTIN 300 MG PO CAPS
300.0000 mg | ORAL_CAPSULE | Freq: Three times a day (TID) | ORAL | Status: DC
  Administered 2017-11-23 – 2017-11-24 (×2): 300 mg via ORAL
  Filled 2017-11-23 (×2): qty 1

## 2017-11-23 MED ORDER — IOPAMIDOL (ISOVUE-370) INJECTION 76%
INTRAVENOUS | Status: AC
  Filled 2017-11-23: qty 100

## 2017-11-23 NOTE — ED Notes (Addendum)
Pt reports pain in her chest and believed she has a blood clot. PA came in room and reported that the pt has pneumonia.

## 2017-11-23 NOTE — H&P (Addendum)
Date: 11/23/2017               Patient Name:  Victoria Alvarez MRN: [id number]  DOB: [birth year] Age / Sex: 27 y.o., female   PCP: [facility]         Medical Service: Internal Medicine Teaching Service         Attending Physician: Dr. Lajean Saver, MD    First Contact: Dr. Truman Hayward Pager: [phone]  Second Contact: Dr. Trilby Drummer  Pager: [phone]       After Hours (After 5p/  First Contact Pager: [phone]  weekends / holidays): Second Contact Pager: [phone]   Chief Complaint: Chest pain  History of Present Illness:  Victoria Alvarez is a 27yo female with history of possible asthma, CVA with sagittal sinus thrombosis, PE, chronic pain secondary to Crohn's disease s/p ileocolectomy who presented to the [facility] ED with dyspnea and cough that started yesterday. She initially thought her pain was heartburn and went to urgent care earlier today. She was started on antibiotics and given a breathing treatment which did not help and so she came to the ED. She has a history of PE for which she takes xarelto and was worried this had occurred again. She has central chest pain that is tender and is especially with inspiration and cough. She has been unable to cough anything up. She denies sore throat. She has not been recently ill. Her 36 year old daughter was sent home from school last week with fever to 99.9 and sore throat, diagnosed with viral URI by her pediatrician. She denies fever, chills, or shortness of breath. She has not had any change in bowel movements and usually has diarrhea due to ileocolectomy secondary to her Crohn's. She denies dysuria, dizziness, or headache.   Chest x-ray was within normal limits but CT of the chest showed bilaterall upper and LL lobe consolidation concerning for multilobar pneumonia. She was saturating on room air when seen, but previously had desatted to 82% with coughing and was temporarily placed on 2L [place].   She has a history of chronic opioid therapy due to  abdominal pain secondary to Chrohn's disease and chronic pelvic pain since 27 years of age. Previously took methadone while pregnant with her son three years ago, but she has been back to being managed with opioid analgesics for the past few years.     Meds:  Current Meds  Medication Sig  . gabapentin (NEURONTIN) 300 MG capsule Take 1 capsule (300 mg total) by mouth 3 (three) times daily. (Patient taking differently: Take 300 mg by mouth 3 (three) times daily. For migraines)  . ibuprofen (ADVIL,MOTRIN) 200 MG tablet Take 200 mg by mouth daily as needed for headache.  . loratadine (CLARITIN) 10 MG tablet Take 10 mg by mouth daily as needed (seasonal allergies).  . ondansetron (ZOFRAN) 4 MG tablet Take 4 mg by mouth every 6 (six) hours as needed for nausea or vomiting.  [name] oxyCODONE (ROXICODONE) 15 MG immediate release tablet Take 15 mg by mouth every 4 (four) hours as needed for pain.  . promethazine (PHENERGAN) 25 MG tablet Take 25 mg by mouth at bedtime as needed for nausea or vomiting.   . rivaroxaban (XARELTO) 20 MG TABS tablet Take 20 mg by mouth at bedtime.      Allergies: Allergies as of 11/23/2017 - Review Complete 11/23/2017  Allergen Reaction Noted  . Bee venom Swelling 06/16/2014   Past Medical History:  Diagnosis Date  .  Anemia 07/2014   Microcytic  . Asthma   . Cerebral vein thrombosis 06/12/2014  . Cerebral venous sinus thrombosis   . Chronic pelvic pain in female   . Depression 2007  . Headache(784.0)   . Hypercoagulable state, primary ([place]) 07/2014   . no clear inherited or acquired cause for the patient's coagulopathy  . Interstitial cystitis    pain from this was reason for starting opioids age 27.   . Miscarriage 2005  . Morbid obesity ([place])    BMI 37 in 11/2014.  . Non-compliance    with anticoagulant  . Numbness    right side  . Opioid dependence ([place])    to Rx opioids.  switched to Methadone after second child born in 04/2014  . Pneumatosis of intestines  10/2014  . Pneumonia 03/2014  . 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 Preeclampsia 2012  . Pulmonary embolism ([place])   . Pyelonephritis affecting pregnancy in first trimester July 2015  . Seizures ([place])   . Stroke [facility]) 05/2014   presented with right sided weakness.     Family History:  Maternal Grandmother: TIIDM  Social History:  Lives in [place] with two children, aged 69 and 3 years. Denies smoking, drinking, and recreational drug use.  Review of Systems: A complete ROS was negative except as per HPI.    Physical Exam: Blood pressure 120/79, pulse 92, temperature 98.3 F (36.8 C), temperature source Oral, resp. rate (!) 21, height 5' 2"  (1.575 m), weight 81.6 kg, last menstrual period 10/29/2017, SpO2 97 %. Constitution: Comfortable appearing, lying supine in bed HEENT: AT/, EOM intact, no scleral icterus Cardio: tachycardic, nml S1, S2 Respiratory: Course crackles bilaterally, mild inspiratory and expiratory wheezing Abdominal: soft, +bs, NTTP, non-distended MSK: no pitting edema, chest pain reproducible with palpation Neuro: oriented, cooperative, normal strength in upper and lower extremities Skin: No rashes or nodules    Assessment & Plan by Problem: Principal Problem:   Community acquired pneumonia Active Problems:   Crohn disease ([place])  27 year old woman with Crohn's disease being admitted for observation of mild community acquired pneumonia.  Community Acquired Pneumonia:  Clinical course seems most consistent with viral or atypical pneumonia. Though she has Crohn's disease, she is not currently on immunosuppressing treatment. Port score of 18 (low risk), CURB65 score 0 (very low risk). Plan to check respiratory viral panel, and treat with ceftriaxone and azithromycin for now. HIV test pending. Given the appearance of the opacities on CT, as well as time course, I have low suspicion this is an extraintestinal manifestation of Crohn's disease. Monitor today, ambulate  with pulse oximetry, if her pain is more controlled later today, could be eligible for discharge to home to continue recovery with oral antibiotics.  Crohn's Disease:  Stable over the last few months, not on any treatment. Last saw Dr. Drema Dallas with [facility] clinic 16 months ago who recommended starting Infliximab, but the patient did not go through with this treatment. I don't think there are any signs of flare of her disease, no need for prednisone right now. I did warn her that the antibiotics will induce more diarrhea. I encouraged her to re-establish with Dr. Drema Dallas for ongoing care.   Hypokalemia: Given 40 mEq potassium chloride in the ED  - given 40 mEq more for tomorrow   History of PE and CVT: Hypercoagulable most likely due to Crohns disease doing well on indefinite anticoagulation. We have ruled out PE with CT angiogram of the chest. I would  continue rivaroxaban at usual dosing.   Migraines: on gabapentin 300 mg TID. Will continue home meds.   IVF: none  Diet: regular Code: Full   Dispo: Admit patient to Observation with expected length of stay less than 2 midnights.  SignedMarty Heck, DO 11/23/2017, 8:28 PM  Pager: [phone]    Internal Medicine Attending:   I saw and examined the patient. I reviewed the resident's note and I agree with the resident's findings and plan as documented in the resident's note. I have made necessary changes to this note.   Lalla Brothers, MD

## 2017-11-23 NOTE — ED Triage Notes (Addendum)
Pt states she felt like she had heartburn yesterday.  Today has pressure to center of chest that radiates to back.  Reports productive cough with white phlegm.  States she was seen at [facility] ED and now has SOB and wheezing since receiving breathing treatment.  History of blood clots.

## 2017-11-23 NOTE — ED Notes (Signed)
Pt has had a bed for 21 minutes. Contacted the floor and spoke with the secretary Morrie Sheldon and she advised the receiving RN was not answering her phone. Morrie Sheldon took this RN's phone number and advised she would have the receiving RN call back.

## 2017-11-23 NOTE — ED Provider Notes (Signed)
[facility] EMERGENCY DEPARTMENT Provider Note   CSN: [id number] Arrival date & time: 11/23/17  1602     History   Chief Complaint Chief Complaint  Patient presents with  . Chest Pain  . Cough    HPI Victoria Alvarez is a 27 y.o. female.  The history is provided by the patient. No language interpreter was used.  Cough  Associated symptoms include shortness of breath and wheezing.  Shortness of Breath  This is a new problem. The average episode lasts 1 day. The problem occurs frequently.The current episode started yesterday. The problem has been gradually worsening. Associated symptoms include sputum production and wheezing. Pertinent negatives include no fever. The treatment provided no relief. She has had prior hospitalizations. Associated medical issues include PE.  Pt reports she felt like she had refux yesterday and had soreness in her chest.  Pt reports she became increasingly short of breath today.  Pt has had a PE in the past.  Pt reports she is taking her xarelto  Pt went to Urgent care.  Pt reports they gave her a breathing treatment.  Pt reports feeling shaky and anxious  Past Medical History:  Diagnosis Date  . Anemia 07/2014   Microcytic  . Asthma   . Cerebral venous sinus thrombosis   . Chronic pelvic pain in female   . Depression 2007  . Headache(784.0)   . Hypercoagulable state, primary (HCC) 07/2014   . no clear inherited or acquired cause for the patient's coagulopathy  . Interstitial cystitis    pain from this was reason for starting opioids age 47.   . Miscarriage 2005  . Morbid obesity (HCC)    BMI 37 in 11/2014.  . Non-compliance    with anticoagulant  . Numbness    right side  . Opioid dependence (HCC)    to Rx opioids.  switched to Methadone after second child born in 04/2014  . 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 Pneumonia 03/2014  . 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 Preeclampsia 2012  . Pulmonary embolism (HCC)   .  Pyelonephritis affecting pregnancy in first trimester July 2015  . Seizures (HCC)   . Stroke [facility]) 05/2014   presented with right sided weakness.     Patient Active Problem List   Diagnosis Date Noted  . Chronic migraine 10/06/2017  . 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 Chest pain 03/25/2016  . Right sided weakness 03/25/2016  . Shoulder pain, right   . Numbness and tingling of right arm   . Pulmonary embolism (HCC) 03/12/2015  . Hypercoagulable state (HCC)   . Palliative care encounter   . Stricture of small intestine (HCC)   . Generalized abdominal pain   . Ileitis   . Lactic acidosis 11/14/2014  . Slow transit constipation   . Depression 11/03/2014  . Cerebral venous sinus thrombosis   . AP (abdominal pain)   . Portal vein thrombosis   . Pneumatosis of intestines 10/08/2014  . Chronic diarrhea 07/12/2014  . 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 Headache 06/16/2014  . Bee sting allergy 06/16/2014  . Cerebral vein thrombosis 06/12/2014  . Cerebral venous infarction, acute (HCC)   . Seizure (HCC) 06/11/2014  . Anemia, iron deficiency   . Tobacco abuse   . Asthma with acute exacerbation   . Thrombosis, superior sagittal sinus   . 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  ABDOMINAL SURGERY    . CESAREAN SECTION N/A 05/06/2014   Procedure: CESAREAN SECTION;  Surgeon: Levie Heritage, DO;  Location: [facility] ORS;  Service: Obstetrics;  Laterality: N/A;  . COLONOSCOPY WITH PROPOFOL N/A 11/18/2014   Procedure: COLONOSCOPY WITH PROPOFOL;  Surgeon: Rachael Fee, MD;  Location: [facility] ENDOSCOPY;  Service: Endoscopy;  Laterality: N/A;  . INGUINAL HERNIA REPAIR Left 2008  . VAGINA SURGERY  ~ 2011   benign vaginal tumor removed at [facility].      OB History    Gravida  3   Para  2   Term  2   Preterm  0   AB  1   Living  2     SAB  1   TAB  0   Ectopic  0   Multiple    0   Live Births  2            Home Medications    Prior to Admission medications   Medication Sig Start Date End Date Taking? Authorizing Provider  gabapentin (NEURONTIN) 300 MG capsule Take 1 capsule (300 mg total) by mouth 3 (three) times daily. Patient taking differently: Take 300 mg by mouth 3 (three) times daily. For migraines 10/02/17  Yes Anson Fret, MD  ibuprofen (ADVIL,MOTRIN) 200 MG tablet Take 200 mg by mouth daily as needed for headache.   Yes [provider]  loratadine (CLARITIN) 10 MG tablet Take 10 mg by mouth daily as needed (seasonal allergies).   Yes [provider]  ondansetron (ZOFRAN) 4 MG tablet Take 4 mg by mouth every 6 (six) hours as needed for nausea or vomiting.   Yes [provider]  oxyCODONE (ROXICODONE) 15 MG immediate release tablet Take 15 mg by mouth every 4 (four) hours as needed for pain.   Yes [provider]  promethazine (PHENERGAN) 25 MG tablet Take 25 mg by mouth at bedtime as needed for nausea or vomiting.    Yes [provider]  rivaroxaban (XARELTO) 20 MG TABS tablet Take 20 mg by mouth at bedtime.    Yes [provider]    Family History Family History  Problem Relation Age of Onset  . Diabetes Mother   . Heart disease Mother   . Anxiety disorder Mother   . Depression Mother   . Hypertension Father   . CAD Father   . Diabetes Maternal Grandmother   . Hypertension Maternal Grandmother   . Stroke Maternal Grandmother   . Hypothyroidism Maternal Grandmother   . Diabetes Maternal Grandfather   . Hypertension Maternal Grandfather   . Diabetes Paternal Grandmother   . Diabetes Paternal Grandfather   . Hypertension Paternal Grandfather     Social History Social History   Tobacco Use  . Smoking status: Current Every Day Smoker    Packs/day: 0.25    Years: 8.00    Pack years: 2.00    Types: Cigarettes  . Smokeless tobacco: Never Used  . Tobacco comment: cutting back   Substance Use Topics  . Alcohol use: No  . Drug use: No    Comment: Pt on Methadone "was on pain pills; weaned off while I was pregnant so it would be safer"     Allergies   Bee venom   Review of Systems Review of Systems  Constitutional: Negative for fever.  Respiratory: Positive for sputum production, shortness of breath and wheezing.   All other systems reviewed and are negative.    Physical Exam  Updated Vital Signs BP 116/76   Pulse 75   Temp 98.3 F (36.8 C) (Oral)   Resp 11   Ht 5\' 2"  (1.575 m)   Wt 81.6 kg   LMP 10/29/2017   SpO2 97%   BMI 32.92 kg/m   Physical Exam  Constitutional: She is oriented to person, place, and time. She appears well-developed and well-nourished.  anxious  HENT:  Head: Normocephalic.  Eyes: EOM are normal.  Neck: Normal range of motion.  Cardiovascular: Normal pulses. Tachycardia present.  Pulmonary/Chest: Accessory muscle usage present.  Rhonchi all lobes,  Harsh cough   Abdominal: Soft. She exhibits no distension.  Musculoskeletal: Normal range of motion.  Neurological: She is alert and oriented to person, place, and time.  Skin: Skin is warm.  Psychiatric: She has a normal mood and affect.  Nursing note and vitals reviewed.    ED Treatments / Results  Labs (all labs ordered are listed, but only abnormal results are displayed) Labs Reviewed  BASIC METABOLIC PANEL - Abnormal; Notable for the following components:      Result Value   Potassium 3.0 (*)    BUN <5 (*)    All other components within normal limits  CBC  I-STAT TROPONIN, ED  I-STAT BETA HCG BLOOD, ED ([facility], [facility], [facility] ONLY)    EKG EKG Interpretation  Date/Time:  Sunday November 23 2017 16:11:38 EDT Ventricular Rate:  90 PR Interval:  160 QRS Duration: 82 QT Interval:  360 QTC Calculation: 440 R Axis:   82 Text Interpretation:  Normal sinus rhythm Nonspecific ST and T wave abnormality Confirmed by Cathren Laine ([id number]) on 11/23/2017 4:56:06  PM   Radiology Dg Chest 2 View  Result Date: 11/23/2017 CLINICAL DATA:  Patient with heartburn. EXAM: CHEST - 2 VIEW COMPARISON:  Chest radiograph 03/25/2016 FINDINGS: Normal cardiac and mediastinal contours. No consolidative pulmonary opacities. No pleural effusion or pneumothorax. IMPRESSION: No acute cardiopulmonary process. Electronically Signed   By: Annia Belt M.D.   On: 11/23/2017 16:44   Ct Angio Chest Pe W And/or Wo Contrast  Result Date: 11/23/2017 CLINICAL DATA:  Pressure to the center of the chest, productive cough short of breath EXAM: CT ANGIOGRAPHY CHEST WITH CONTRAST TECHNIQUE: Multidetector CT imaging of the chest was performed using the standard protocol during bolus administration of intravenous contrast. Multiplanar CT image reconstructions and MIPs were obtained to evaluate the vascular anatomy. CONTRAST:  48mL ISOVUE-370 IOPAMIDOL (ISOVUE-370) INJECTION 76% COMPARISON:  Chest x-ray 11/23/2017, CT 03/12/2015 FINDINGS: Cardiovascular: Satisfactory opacification of the pulmonary arteries to the segmental level. No evidence of pulmonary embolism. Nonaneurysmal aorta. Heart size within normal limits. No pericardial effusion. Mediastinum/Nodes: Midline trachea. No thyroid mass. Esophagus within normal limits. Slightly enlarged mediastinal lymph nodes, low right paratracheal lymph node measures 11 mm. Subcarinal lymph node measures 15 mm. Right hilar nodes, measuring up to 1 cm on the right Lungs/Pleura: No pleural effusion. Patchy bilateral foci of ground-glass density and consolidation within the upper lobes with patchy consolidation in the left lower lobe. Upper Abdomen: No acute abnormality. Musculoskeletal: No chest wall abnormality. No acute or significant osseous findings. Review of the MIP images confirms the above findings. IMPRESSION: 1. Negative for acute pulmonary embolus. 2. Patchy foci of ground-glass density and consolidation within the bilateral upper lobes and left lower  lobes concerning for multifocal pneumonia. Mild mediastinal and right hilar adenopathy, likely reactive. Electronically Signed   By: Jasmine Pang M.D.   On: 11/23/2017 19:09    Procedures Procedures (including  critical care time)  Medications Ordered in ED Medications  cefTRIAXone (ROCEPHIN) 1 g in sodium chloride 0.9 % 100 mL IVPB (has no administration in time range)  azithromycin (ZITHROMAX) 500 mg in sodium chloride 0.9 % 250 mL IVPB (has no administration in time range)  LORazepam (ATIVAN) injection 0.5 mg (0.5 mg Intravenous Given 11/23/17 1753)  iopamidol (ISOVUE-370) 76 % injection 100 mL (48 mLs Intravenous Contrast Given 11/23/17 1857)     Initial Impression / Assessment and Plan / ED Course  I have reviewed the triage vital signs and the nursing notes.  Pertinent labs & imaging results that were available during my care of the patient were reviewed by me and considered in my medical decision making (see chart for details).     MDM  Pt anxious,  Rattling cough,  Pt feel like she has phelgm keeping her from breathing.  Pt's initial 02 96 %.  Pt decreased to 82% with coughing.  Pt placed on 02 at 2 liters.   Pt unable to lay back.   Pt given ativan 0.5mg  before ct scan.   Ct scan shows multifocal pneumonia.  Pt counseled on results.  Pt currently breathing easier.   Pt given Iv rocephin and zithromax    I spoke to Internal medicine resident who will admit/ Final Clinical Impressions(s) / ED Diagnoses   Final diagnoses:  Community acquired pneumonia, unspecified laterality    ED Discharge Orders    None       Osie Cheeks 11/23/17 2023    Cathren Laine, MD 11/23/17 2225

## 2017-11-23 NOTE — Progress Notes (Signed)
2230 received pt from ED. A&O x4, tearful, complains pain 8/10.  Ambulated to bed. Admin pain med and will continue to monitor.

## 2017-11-23 NOTE — ED Notes (Signed)
ED Provider at bedside. 

## 2017-11-24 ENCOUNTER — Other Ambulatory Visit: Payer: Self-pay

## 2017-11-24 ENCOUNTER — Encounter (HOSPITAL_COMMUNITY): Payer: Self-pay | Admitting: Student in an Organized Health Care Education/Training Program

## 2017-11-24 DIAGNOSIS — Z86718 Personal history of other venous thrombosis and embolism: Secondary | ICD-10-CM | POA: Diagnosis not present

## 2017-11-24 DIAGNOSIS — K5090 Crohn's disease, unspecified, without complications: Secondary | ICD-10-CM | POA: Diagnosis not present

## 2017-11-24 DIAGNOSIS — G8929 Other chronic pain: Secondary | ICD-10-CM | POA: Diagnosis not present

## 2017-11-24 DIAGNOSIS — J1289 Other viral pneumonia: Secondary | ICD-10-CM | POA: Diagnosis not present

## 2017-11-24 DIAGNOSIS — J189 Pneumonia, unspecified organism: Secondary | ICD-10-CM | POA: Diagnosis present

## 2017-11-24 LAB — RESPIRATORY PANEL BY PCR

## 2017-11-24 LAB — BASIC METABOLIC PANEL
Anion gap: 9 (ref 5–15)
BUN: <5 mg/dL — ABNORMAL LOW (ref 6–20)
CO2: 22 mmol/L (ref 22–32)
Calcium: 8.6 mg/dL — ABNORMAL LOW (ref 8.9–10.3)
Chloride: 106 mmol/L (ref 98–111)
Creatinine, Ser: 0.87 mg/dL (ref 0.44–1.00)
GFR calc Af Amer: >60 mL/min (ref >60)
GFR calc non Af Amer: >60 mL/min (ref >60)
Glucose, Bld: 93 mg/dL (ref 70–99)
Potassium: 3.4 mmol/L — ABNORMAL LOW (ref 3.5–5.1)
Sodium: 137 mmol/L (ref 135–145)

## 2017-11-24 LAB — CBC
HCT: 38.2 % (ref 36.0–46.0)
Hemoglobin: 13.3 g/dL (ref 12.0–15.0)
MCH: 31.5 pg (ref 26.0–34.0)
MCHC: 34.8 g/dL (ref 30.0–36.0)
MCV: 90.5 fL (ref 78.0–100.0)
Platelets: 161 10*3/uL (ref 150–400)
RBC: 4.22 MIL/uL (ref 3.87–5.11)
RDW: 11.9 % (ref 11.5–15.5)
WBC: 5.4 10*3/uL (ref 4.0–10.5)

## 2017-11-24 LAB — HIV ANTIBODY (ROUTINE TESTING W REFLEX): HIV Screen 4th Generation wRfx: NONREACTIVE

## 2017-11-24 MED ORDER — OXYCODONE HCL 5 MG PO TABS
15.0000 mg | ORAL_TABLET | Freq: Four times a day (QID) | ORAL | Status: DC
  Administered 2017-11-24: 15 mg via ORAL
  Filled 2017-11-24: qty 3

## 2017-11-24 MED ORDER — IPRATROPIUM-ALBUTEROL 0.5-2.5 (3) MG/3ML IN SOLN: 3.0000 mL | Freq: Three times a day (TID) | RESPIRATORY_TRACT | Status: DC

## 2017-11-24 MED ORDER — INFLUENZA VAC SPLIT QUAD 0.5 ML IM SUSY: 0.5000 mL | PREFILLED_SYRINGE | INTRAMUSCULAR | Status: DC

## 2017-11-24 MED ORDER — DICLOFENAC SODIUM 1 % TD GEL: 2.0000 g | Freq: Four times a day (QID) | TRANSDERMAL | 0 refills | Status: AC

## 2017-11-24 NOTE — Progress Notes (Addendum)
   Subjective:  Reports continued chest pain exacerbated by inspiration. States she has a continued cough productive of purulent sputum. Says she has had no shortness of breath and no oxygen requirement since her initial episode of desaturation in the emergency room. Reports voltaren gel has improved her chest pain. Reports no recent rashes or joint pain. Endorses a bad week with her Crohn's last week, but denies any current abdominal pain.   Objective:  Vital signs in last 24 hours: Vitals:   11/23/17 2240 11/24/17 0010 11/24/17 0600 11/24/17 0802  BP: 100/63  104/60   Pulse: 72  69   Resp: 20  18   Temp: (!) 97.5 F (36.4 C)  97.7 F (36.5 C)   TempSrc: Oral  Oral   SpO2: 100% 93% 97% 99%  Weight:      Height:       Weight change:   Intake/Output Summary (Last 24 hours) at 11/24/2017 0926 Last data filed at 11/23/2017 2106 Gross per 24 hour  Intake 1350 ml  Output -  Net 1350 ml   General: Lying in bed, tearful, NAD. Cardiac: RRR, no murmurs, rubs or gallops Pulm: coarse breath sounds with diffuse crackles  GI: BS+; abdomen is soft, non-tender Ext: no lower extremity edema     Assessment/Plan:  Active Problems:   Pneumonia   Community acquired pneumonia  This is a 27 yo female with history of possible asthma not on any maintenance medications, cerebral vein thrombosis and PE on Xarelto and chronic pain secondary to Crohn's disease, who presented to the [facility] ED with dyspnea and cough and found to have multilobar pneumonia on CT.  Multilobar Pneumonia: Continued chest pain and productive cough. Continues to be afebrile with normal white count. Consider viral pneumonia as possible cause based on CT and symptoms atypical for bacterial pneumonia. Assess oxygen requirement. - RVP pending - continue ceftriaxone and azithromycin  - continue voltaren gel for chest pain  - duonebs q6h while awake - continue flutter valve q 2 while awake - if patient is able to maintain  adequate oxygenation while ambulating, tolerates PO intake, and her pain is adequately controlled she can likely go home later today.   Hypokalemia: s/p oral repletion  Diet: Regular diet as tolerated.    LOS: 1 day   Midge Aver, Medical Student 11/24/2017, 9:26 AM  Attestation for Student Documentation:  I personally was present and performed or re-performed the history, physical exam and medical decision-making activities of this service and have verified that the service and findings are accurately documented in the student's note.  Lenward Chancellor D, DO 11/24/2017, 11:06 AM

## 2017-11-24 NOTE — Plan of Care (Signed)
  Problem: Activity: Goal: Risk for activity intolerance will decrease Outcome: Progressing   Problem: Coping: Goal: Level of anxiety will decrease Outcome: Progressing   Problem: Pain Managment: Goal: General experience of comfort will improve Outcome: Progressing   

## 2017-11-24 NOTE — Discharge Instructions (Signed)
Information on my medicine - XARELTO (rivaroxaban)  This medication education was reviewed with me or my healthcare representative as part of my discharge preparation.    WHY WAS XARELTO PRESCRIBED FOR YOU? Xarelto was prescribed to prevent or reduce risk of blood clots recurring in the veins of your legs (deep vein thrombosis), in your lungs (pulmonary embolism) and to reduce the risk of stroke.  What do you need to know about Xarelto? The dose is one 20 mg tablet taken ONCE A DAY with your evening meal.  DO NOT stop taking Xarelto without talking to the health care provider who prescribed the medication.  Refill your prescription for 20 mg tablets before you run out.  After discharge, you should have regular check-up appointments with your healthcare provider that is prescribing your Xarelto.  In the future your dose may need to be changed if your kidney function changes by a significant amount.  What do you do if you miss a dose? If taking Xarelto ONCE DAILY and you miss a dose, take it as soon as you remember on the same day then continue your regularly scheduled once daily regimen the next day. Do not take two doses of Xarelto at the same time.   Important Safety Information Xarelto is a blood thinner medicine that can cause bleeding. You should call your healthcare provider right away if you experience any of the following: ? Bleeding from an injury or your nose that does not stop. ? Unusual colored urine (red or dark brown) or unusual colored stools (red or black). ? Unusual bruising for unknown reasons. ? A serious fall or if you hit your head (even if there is no bleeding).  Some medicines may interact with Xarelto and might increase your risk of bleeding while on Xarelto. To help avoid this, consult your healthcare provider or pharmacist prior to using any new prescription or non-prescription medications, including herbals, vitamins, non-steroidal anti-inflammatory drugs  (NSAIDs) and supplements.  This website has more information on Xarelto: VisitDestination.com.br.

## 2017-11-26 NOTE — Discharge Summary (Signed)
Name: Victoria Alvarez MRN: [id number] DOB: 14-Dec-1990 27 y.o. PCP: Victoria Alvarez Total Alvarez  Date of Admission: 11/23/2017  4:03 PM Date of Discharge: 11/24/2017 Attending Physician: Victoria Alias, MD  Discharge Diagnosis: 1. Multilobar pneumonia  Discharge Medications: Allergies as of 11/24/2017      Reactions   Bee Venom Swelling   Worse reaction if stung from the neck up      Medication List    TAKE these medications   diclofenac sodium 1 % Gel Commonly known as:  VOLTAREN Apply 2 g topically 4 (four) times daily.   gabapentin 300 MG capsule Commonly known as:  NEURONTIN Take 1 capsule (300 mg total) by mouth 3 (three) times daily. What changed:  additional instructions   ibuprofen 200 MG tablet Commonly known as:  ADVIL,MOTRIN Take 200 mg by mouth daily as needed for headache.   loratadine 10 MG tablet Commonly known as:  CLARITIN Take 10 mg by mouth daily as needed (seasonal allergies).   ondansetron 4 MG tablet Commonly known as:  ZOFRAN Take 4 mg by mouth every 6 (six) hours as needed for nausea or vomiting.   oxyCODONE 15 MG immediate release tablet Commonly known as:  ROXICODONE Take 15 mg by mouth every 4 (four) hours as needed for pain.   promethazine 25 MG tablet Commonly known as:  PHENERGAN Take 25 mg by mouth at bedtime as needed for nausea or vomiting.   rivaroxaban 20 MG Tabs tablet Commonly known as:  XARELTO Take 20 mg by mouth at bedtime.       Disposition and follow-up:   VictoriaVictoria Alvarez was discharged from [facility] in Good condition.  At the hospital follow up visit please address:  1.  Multilobar pneumonia - respiratory viral panel positive for rhinovirus. Patient improved clinically after one night of observation. She was not discharged on any antibiotics; CT scan and clinical picture indicative of viral etiology. Please ensure her chest pain and cough have continued to improve.   2.  Labs /  imaging needed at time of follow-up: none   3.  Pending labs/ test needing follow-up: none  Follow-up Appointments: Follow-up Information    Care, Victoria Alvarez. Schedule an appointment as soon as possible for a visit in 1 week(s).   Specialty:  Family Medicine Contact information: [address] [place] [address] [phone]        Victoria Ribas, MD. Schedule an appointment as soon as possible for a visit in 3 week(s).   Specialty:  General Surgery Contact information: [address] [place] [address] [phone]          This is a 27 yo female with history of possible asthma not on any maintenance medications, cerebral vein thrombosis and PE on Xarelto and chronic pain secondary to Crohn's disease, who presented to the [facility] ED with dyspnea and cough andfound to havemultilobar pneumonia on CT.  Hospital Course by problem list: 1. Multilobar pneumonia: Victoria Alvarez presented with 2 days of progressive dyspnea, cough and chest pain. Due to history of PE, CT angiogram was done to rule out recurrence. CT was negative for PE, but showed bilateral upper and lower lobe consolidations consistent with pneumonia. She was started on ceftriaxone and azithromycin. After a night of observation, she had improvement in chest pain and dyspnea. Respiratory viral panel came back positive for rhinovirus so antibiotics were discontinued, as this was consistent with her  clinical picture. Patient maintained adequate saturation on room air at rest and with ambulation. She was discharged home with planned follow-up with PCP. She may have some underlying reactive airway disease, but is not on any treatment. May benefit from PFTs to further evaluate.   2. Crohn's disease: She did not show any signs of an acute flare. Reported she had been stable over the last several months. Currently only treated with Oxycodone for chronic pain. Recommended follow-up with Dr.  Zachery Alvarez at [facility], as it appears she has not seen him in over a year.   3. History of PE and CVT: hypercoagulable in the setting of Crohn's. CTA ruled out acute PE. She was continued on home Xarelto.    Discharge Vitals:   BP 121/63 (BP Location: Left Arm)   Pulse 81   Temp 98.5 F (36.9 C) (Oral)   Resp 17   Ht 5\' 2"  (1.575 m)   Wt 81.6 kg   LMP 11/24/2017   SpO2 97% Comment: while walking around the halls  BMI 32.92 kg/m   Pertinent Labs, Studies, and Procedures:  CT angiogram chest IMPRESSION: 1. Negative for acute pulmonary embolus. 2. Patchy foci of ground-glass density and consolidation within the bilateral upper lobes and left lower lobes concerning for multifocal pneumonia. Mild mediastinal and right hilar adenopathy, likely reactive.  Discharge Instructions: Discharge Instructions    Call MD for:  difficulty breathing, headache or visual disturbances   Complete by:  As directed    Call MD for:  extreme fatigue   Complete by:  As directed    Call MD for:  persistant dizziness or light-headedness   Complete by:  As directed    Call MD for:  persistant nausea and vomiting   Complete by:  As directed    Call MD for:  severe uncontrolled pain   Complete by:  As directed    Call MD for:  temperature >100.4   Complete by:  As directed    Diet - low sodium heart healthy   Complete by:  As directed    Discharge instructions   Complete by:  As directed    Victoria Alvarez, it was a pleasure taking care of you. I'm glad you are feeling better. We are not sending you home with any antibiotics because you most likely have a viral pneumonia that will get better on its own.  I would advise you to rest as much as possible and drink plenty of fluids over the next few days. It appears you have not had a follow-up appointment with your Crohn's doctor in a while at [facility]. Please call and schedule an appointment at your earliest convenience.  I would also encourage a follow-up  appointment with your PCP within the next week to ensure you have continued to get better.   Increase activity slowly   Complete by:  As directed       Signed: Bridget Hartshorn, DO 11/26/2017, 8:56 PM   Pager: [phone]

## 2017-12-16 ENCOUNTER — Ambulatory Visit: Admit: 2017-12-16 | Discharge: 2017-12-17 | Payer: MEDICAID

## 2017-12-16 DIAGNOSIS — K50919 Crohn's disease, unspecified, with unspecified complications: Principal | ICD-10-CM

## 2017-12-29 MED ORDER — PEG-ELECTROLYTE SOLUTION 420 GRAM ORAL SOLUTION
0 refills | 0 days | Status: CP
Start: 2017-12-29 — End: 2018-01-27

## 2018-01-27 ENCOUNTER — Ambulatory Visit: Admit: 2018-01-27 | Discharge: 2018-01-27 | Payer: MEDICAID

## 2018-01-27 ENCOUNTER — Encounter: Admit: 2018-01-27 | Discharge: 2018-01-27 | Payer: MEDICAID | Attending: Anesthesiology | Primary: Anesthesiology

## 2018-01-27 MED ORDER — CHOLESTYRAMINE (BULK) POWDER
2 refills | 0 days | Status: CP
Start: 2018-01-27 — End: 2018-02-10

## 2018-02-10 ENCOUNTER — Ambulatory Visit: Admit: 2018-02-10 | Discharge: 2018-02-11 | Payer: MEDICAID

## 2018-02-10 DIAGNOSIS — K50919 Crohn's disease, unspecified, with unspecified complications: Principal | ICD-10-CM

## 2018-02-10 MED ORDER — EMPTY CONTAINER
PRN refills | 0 days
Start: 2018-02-10 — End: ?

## 2018-02-10 MED ORDER — AZATHIOPRINE 100 MG TABLET
ORAL_TABLET | Freq: Every day | ORAL | 2 refills | 0.00000 days | Status: CP
Start: 2018-02-10 — End: 2018-08-14

## 2018-02-10 MED ORDER — ADALIMUMAB PEN CITRATE FREE 40 MG/0.4 ML
SUBCUTANEOUS | 6 refills | 0.00000 days | Status: CP
Start: 2018-02-10 — End: 2018-04-06

## 2018-02-10 MED ORDER — COLESTIPOL 1 GRAM TABLET
ORAL_TABLET | Freq: Two times a day (BID) | ORAL | 3 refills | 0 days | Status: CP
Start: 2018-02-10 — End: 2019-02-10

## 2018-02-10 MED ORDER — ADALIMUMAB 80 MG/0.8 ML SUBCUTANEOUS PEN KIT
0 refills | 0 days | Status: CP
Start: 2018-02-10 — End: 2018-08-27
  Filled 2018-02-13: qty 3, 28d supply, fill #0

## 2018-02-10 NOTE — Unmapped (Addendum)
1. We will start adalimumab and azathioprine combination therapy  2. Please monitor your symptoms closely  3. I sent in a prescription for colestid. Please make sure to space this medication away from your other medications  4. Please do not hesitate to contact with any questions.  5. You will need labs in 2 weeks, 4 weeks, and then every 12 weeks thereafter while taking azathioprine.      Patrese Neal L. Zachery Dauer, MD, MPH  Assistant Professor of Medicine  Division of Gastroenterology and Hepatology  [facility] [place] [facility]    IBD NURSE CONTACT INFO:  - During daytime hours 8 AM to 4:30 PM M-F, please call your IBD Nurse Coordinator [place], [phone]. If you receive a voicemail recording, know that these messages are secure, please leave your full name, date of birth or medical record number, best return contact # and a description of your urgent need and your IBD Nurse Coordinator will return your call.  - For Urgent matters after business hours and weekends, please call the [facility] at [phone] and ask for the GI Medicine fellow on call. This physician will not be familiar with your specific case, but can answer urgent questions and make recommendations.

## 2018-02-10 NOTE — Unmapped (Signed)
[facility]  INFLAMMATORY BOWEL DISEASE  02/10/2018    Demographics:  Jasmin Duncan is a 27 y.o. year old female    Referring physician:   Andrey Farmer, MD  [address]  [id number] [employer]  [place], [place] [address]          HPI / NOTE :     Today, I saw Jasmin Duncan for follow up in the [facility] at the request of Dr. Midge Aver regarding Crohn's Disease.      HPI:  Jasmin Duncan is a 27 y.o. 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 In the interim since she was last seen, the pt has undergone a colonoscopy. The prep was inadequate but she had i0 disease. She has had intermittent loose stools and abdominal discomfort since that procedure, including after eating. She notes that on good days she will have 1-2 BM, but on bad days she can have more than 10 BM. She has been eating more venison, which is lean, and may help with the frequency. She is very interested in starting maintenance therapy for Crohn's disease. She denies rashes. She had one oral ulcer that has healed.     Review of Systems: positive as described in the HPI. Otherwise, the balance of 10 systems is negative.          IBD HISTORY:     Year of disease onset:  2016  Diagnosis:  Crohn's Disease  Age at onset:   47-40 yr old (A2)  Location:  Ileal (L1)  Behavior:  Stricturing (B2)  Perianal Dz:  No    Brief IBD Disease Course:      Diagnosed at the age of 73 after acute small bowel obstruction, ultimately requiring resection of her terminal ileum as well as cecum. Currently has a ileocolonic anastomosis. Not placed on medications postoperatively. She did not follow up with gastroenterology postoperatively. 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    Endoscopy:        Colonoscopy 07/26/2016:  Impression:        - Preparation of the colon was poor.                     - Decreased sphincter tone found on digital rectal exam.                     - Stool in the entire examined colon.                     - End-to-side ileo-colonic anastomosis, characterized by                      congestion, edema, erythema and friable mucosa. Biopsied.                     - Crohn's disease with ileitis. Inflammation was found.                      This was graded as Rutgeerts Score i1 (five or fewer                      aphthous lesions), although inflammation could be more  significant in non-visualized colon.                     - Normal mucosa in the entire examined colon.                     - The distal rectum and anal verge are normal on                      retroflexion view.    Colonoscopy 2016 prior to surgical resection showed a stricture in the distal ileum approximately 10-15 cm from the ileocecal valve which also appeared ulcerated and inflamed. Biopsy showed chronic ileitis    Imaging:      CT scan on 06/2015 showed no small bowel inflammation, patent anastomosis, moderate stool burden present throughout the colon    Prior IBD medications (type, dose, duration, response):    Pentasa, patient states she took it for a week does not remember the dose but stopped it after not being able to swallow the pills and no improvement in her symptoms          Past Medical History:   Past medical history:   Past Medical History:   Diagnosis Date   ??? 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 Crohn's disease (CMS-HCC)    ??? Depression 11/03/2014   ??? Portal vein thrombosis 07/09/2016   ??? Pulmonary embolism (CMS-HCC)    ??? Seizure (CMS-HCC)     Seizure with stroke     Past surgical history:   Past Surgical History:   Procedure Laterality Date   ??? CESAREAN SECTION     ??? HERNIA REPAIR     ??? LAPAROSCOPIC SMALL BOWEL RESECTION     ??? PR COLONOSCOPY FLX DX W/COLLJ SPEC WHEN PFRMD N/A 07/26/2016    Procedure: COLONOSCOPY, FLEXIBLE, PROXIMAL TO SPLENIC FLEXURE; DIAGNOSTIC, W/WO COLLECTION SPECIMEN BY BRUSH OR WASH;  Surgeon: Andrey Farmer, MD;  Location: [facility];  Service: Gastroenterology   ??? PR COLONOSCOPY FLX DX W/COLLJ SPEC WHEN PFRMD N/A 01/27/2018    Procedure: COLONOSCOPY, FLEXIBLE, PROXIMAL TO SPLENIC FLEXURE; DIAGNOSTIC, W/WO COLLECTION SPECIMEN BY BRUSH OR WASH;  Surgeon: Luanne Bras, MD;  Location: [facility];  Service: Gastroenterology     Family history:   Family History   Problem Relation Age of Onset   ??? Cancer Maternal Grandmother         Thyroid   ??? Colorectal Cancer Neg Hx    ??? Irritable bowel syndrome Neg Hx      Social history:   Social History     Socioeconomic History   ??? Marital status: Single     Spouse name: None   ??? Number of children: None   ??? Years of education: None   ??? Highest education level: None   Occupational History   ??? None   Social Needs   ??? Financial resource strain: None   ??? Food insecurity:     Worry: None     Inability: None   ??? Transportation needs:     Medical: None     Non-medical: None   Tobacco Use   ??? Smoking status: Current Every Day Smoker     Packs/day: 0.25     Types: Cigarettes     Start date: 04/02/2004   ??? Smokeless tobacco: Never Used   ??? Tobacco comment: declined printed info   Substance and Sexual Activity   ???  Alcohol use: No   ??? Drug use: No   ??? Sexual activity: None   Lifestyle   ??? Physical activity:     Days per week: None     Minutes per session: None   ??? Stress: None   Relationships   ??? Social connections:     Talks on phone: None     Gets together: None     Attends religious service: None     Active member of club or organization: None     Attends meetings of clubs or organizations: None     Relationship status: None   Other Topics Concern   ??? None   Social History Narrative   ??? None             Allergies:     Allergies   Allergen Reactions   ??? Venom-Honey Bee Swelling     Worse reaction if stung from the neck up             Medications:     Current Outpatient Medications   Medication Sig Dispense Refill   ??? EPINEPHrine (EPIPEN 2-PAK) 0.3 mg/0.3 mL injection      ??? gabapentin (NEURONTIN) 100 MG capsule Take 100 mg by mouth Three (3) times a day.   0   ??? ondansetron (ZOFRAN) 4 MG tablet Take 4 mg by mouth every six (6) hours as needed for nausea.     ??? oxyCODONE (ROXICODONE) 10 mg immediate release tablet Take 10 mg by mouth every eight (8) hours as needed.   0   ??? promethazine (PHENERGAN) 25 MG tablet Take 25 mg by mouth Three (3) times a day after meals.     ??? rivaroxaban (XARELTO) 20 mg tablet Take 20 mg by mouth daily with evening meal.      ??? azaTHIOprine (IMURAN) 100 mg tablet Take 1.5 tablets (150 mg total) by mouth daily. 45 tablet 2   ??? colestipol (COLESTID) 1 gram tablet Take 1 tablet (1 g total) by mouth Two (2) times a day. 180 tablet 3   ??? colestipol (COLESTID) 1 gram tablet Take 1 tablet (1 g total) by mouth Two (2) times a day. 180 tablet 3     No current facility-administered medications for this visit.              Physical Exam:   BP 116/60  - Pulse 74  - Temp 36.5 ??C (97.7 ??F)  - Wt 86.2 kg (190 lb)  - BMI 34.75 kg/m??     GEN: no apparent distress, appears comfortable on exam  HEENT: PEERL, OP clear with no erythema, lesions, exudate, mucous membranes moist  NEURO:  gait normal, non-focal, no obvious neurologic abnormality  NECK: Supple, no lymphadenopathy  LUNGS: CTAB, no wheezes, rales, or rhonchi  CV: S1/S2, RRR, no murmurs  GI: Soft, Some tenderness to palpation throughout, nondistended, normoactive bowel sounds, no rebound/guarding, no appreciable organomegaly  Extremities: no cyanosis, clubbing or edema, normal gait  Psych: affect appropriate, A&O x3  SKIN: no visible lesions on face, neck, arms, abdomen  PERIANAL / RECTAL EXAM:   Deferred     CT 08/05/2016:  1.No evidence of active bowel inflammation.  2.Supraumbilical and periumbilical ventral hernias containing bowel. ??No obstruction.  3.Trace bilateral pleural effusions.  4.Right ovarian corpus luteum cyst. ............................................................................................................................................[name]          Assessment & Recommendations:     Jasmin Duncan is a 27 y.o. female who presents for Crohn's ileitis,  status post ileocecal resection on no postoperative therapy. Although she has no active disease (i0), I am concerned about her long-term trajectory. She had active disease previously, and has high-risk factors in the form of active smoking. Although she is trying to quit, she is still smoking at this point. With this in mind, I have recommended initiation of adalimumab and azathioprine combination therapy. We again reviewed the rationale for combination therapy for the first 26-52 weeks, as well as the potential adverse effects such as lymphoma, melanoma, and non-melanoma skin cancer, as well as infection risks. She is in agreement with this plan. Given her prior surgeries, some of her frequency issues may be bile-salt mediated diarrhea, thus we will initiate a trial of colestid. She will contact with further questions.    PLAN:  - start adalimumab and azthioprine  - monitor symptoms closely  - check labs at week 2, 4, and 12  - need for smoking cessation reiterated today  - pt to RTC in 12 weeks    Yong Wahlquist L. Zachery Dauer, MD, MPH  Assistant Professor of Medicine  Division of Gastroenterology and Hepatology  [place] [facility] [place]      --------------------------------------------  [facility]  Multidisciplinary Inflammatory Bowel Disease Clinic    I saw and evaluated the patient, participating in the key portions of the service.  I reviewed the fellow???s note.  I agree with the fellow's findings and plan. Colette Ribas, MD, MPH

## 2018-02-11 NOTE — Unmapped (Signed)
[facility] lab entry for azathioprine monitoring per protocol - patient verbalized understanding and will report to [facility]

## 2018-02-11 NOTE — Unmapped (Signed)
[facility] Specialty Medication Referral: [place] APPROVED    Medication (Brand/Generic): Humira    Initial Test Claim completed with resulted information below:    Patient ABLE to fill at [facility]  Insurance Company:  [facility] [employer]  Anticipated Copay: $3  Is anticipated copay with a copay card or grant? No    Does patient's insurance plan only allow a 15 day supply for the first 6 fills in the Split Fill Program? No  If yes, inform patient they can request to dis-enroll from the [facility] by calling the patient help desk at N/A.      If the copay is under the $25 defined limit, per policy there will be no further investigation of need for financial assistance at this time unless patient requests. This referral has been communicated to the provider and handed off to the [facility] team for further processing and filling of prescribed medication.   ______________________________________________________________________  Please utilize this referral for viewing purposes as it will serve as the central location for all relevant documentation and updates.

## 2018-02-11 NOTE — Unmapped (Signed)
[facility] Specialty Medication Referral: PA APPROVED    Medication (Brand/Generic): HUMIRA    Initial Test Claim completed with resulted information below:    Patient ABLE to fill at [facility] Company:  [place] MEDICAID  Anticipated Copay: $3  Is anticipated copay with a copay card or grant? NO    Does patient's insurance plan only allow a 15 day supply for the first 6 fills in the [place] Program? NO  If yes, inform patient they can request to dis-enroll from the [facility] by calling the patient help desk at N/A.      If the copay is under the $25 defined limit, per policy there will be no further investigation of need for financial assistance at this time unless patient requests. This referral has been communicated to the provider and handed off to the [facility] team for further processing and filling of prescribed medication.   ______________________________________________________________________  Please utilize this referral for viewing purposes as it will serve as the central location for all relevant documentation and updates.

## 2018-02-12 NOTE — Unmapped (Signed)
[facility]   Patient Onboarding/Medication Counseling    Ms.Bodiford is a 27 y.o. female with Crohn's Disease who I am counseling today on initiation of therapy.    Medication: Humira 80mg /0.39ml    Verified patient's date of birth / HIPAA.      Education Provided: ?    Dose/Administration discussed: Inject the contents of 2 pens (160mg ) under the skin on day 0, then 1 pen (80mg ) on day 15, then maintenance dose starting on day 29. This medication should be taken  without regard to food.  Stressed the importance of taking medication as prescribed and to contact provider if that changes at any time.  Discussed missed dose instructions.    Storage requirements: this medicine should be stored in the refrigerator.     Side effects / precautions discussed: Discussed common side effects, including but not limited to injection site reactions, worsening of GI symptoms, etc.. If patient experiences signs/sympoms of an allergic reaction (hives/rash/itching, red/swollen/blistered/peeling skin, wheezing, tightness in chest/throat, difficultly breathing/swallowing/talking, unusual hoarseness, swelling of mouth/face/lips/tongue/throat, etc.)  , s/sx of infection, skin changes, any worsening or alarming cardio symptoms, etc., they need to call the doctor.  Patient will receive a drug information handout with shipment.    Handling precautions / disposal reviewed:  Patient will dispose of needles in a sharps container or empty laundry detergent bottle.    Drug Interactions: other medications reviewed and up to date in Epic.  No drug interactions identified.    Comorbidities/Allergies: reviewed and up to date in Epic.    Verified therapy is appropriate and should continue      Delivery Information    Medication Assistance provided: Prior Authorization    Anticipated copay of $3.00 reviewed with patient. Verified delivery address in Epic.    Scheduled delivery date: 02/17/18  Medication will be delivered via UPS to the home address in [facility] [place] [facility].  This shipment will not require a signature.      Explained the services we provide at [facility] and that each month we would call to set up refills.  Stressed importance of returning phone calls so that we could ensure they receive their medications in time each month.  Informed patient that we should be setting up refills 7-10 days prior to when they will run out of medication.  Informed patient that welcome packet will be sent.      Patient verbalized understanding of the above information as well as how to contact the pharmacy at [phone] option 4 with any questions/concerns.  The pharmacy is open Monday through Friday 8:30am-4:30pm.  A pharmacist is available 24/7 via pager to answer any clinical questions they may have.        Patient Specific Needs      ? Patient has no physical, cognitive, or cultural barriers.    ? Patient prefers to have medications discussed with  Patient     ? Patient is able to read and understand education materials at a high school level or above.    ? Patient's primary language is  English           Elfredia Nevins  [facility] Specialty Pharmacist

## 2018-02-13 MED FILL — EMPTY CONTAINER: 30 days supply | Qty: 1 | Fill #0

## 2018-02-13 MED FILL — EMPTY CONTAINER: 30 days supply | Qty: 1 | Fill #0 | Status: AC

## 2018-02-13 MED FILL — HUMIRA PEN CITRATE FREE STARTER PACK FOR CROHN'S/UC/HS 3 X 80 MG/0.8 ML: 28 days supply | Qty: 3 | Fill #0 | Status: AC

## 2018-02-19 NOTE — Unmapped (Signed)
Message received from patient to advise she started Humira this week - tolerated well and had no issues with injection   She also started Azathioprine yesterday and wanted to re-confirm blood work protocol.    Return contact made - lab protocol reviewed   Patient will report to local [facility] at week 2,4,6,8 and every 12 weeks as discussed.    No further questions/concerns at this time - she is aware of IBD RN contact for needs

## 2018-03-09 NOTE — Unmapped (Signed)
[facility] Specialty Pharmacy Refill and Clinical Coordination Note  Medication(s): Humira 40mg /0.31ml    TIMIA CASSELMAN, DOB: [birth year]  Phone: [phone] (home) , Alternate phone contact: N/A  Shipping address: [address]  [place] [address]  Phone or address changes today?: No  All above HIPAA information verified.  Insurance changes? No    Completed refill and clinical call assessment today to schedule patient's medication shipment from the [facility] [phone]).      MEDICATION RECONCILIATION    Confirmed the medication and dosage are correct and have not changed: Yes, regimen is correct and unchanged.    Were there any changes to your medication(s) in the past month:  No, there are no changes reported at this time.    ADHERENCE    Is this medicine transplant or covered by Medicare Part B? No.    Did you miss any doses in the past 4 weeks? No missed doses reported.  Adherence counseling provided? Not needed     SIDE EFFECT MANAGEMENT    Are you tolerating your medication?:  Shakira reports tolerating the medication.  Side effect management discussed: None      Therapy is appropriate and should be continued.    Evidence of clinical benefit: See Epic note from 02/10/18      FINANCIAL/SHIPPING    Delivery Scheduled: Yes, Expected medication delivery date: 03/17/18     Medication will be delivered via UPS to the home address in [facility].    Additional medications refilled: No additional medications/refills needed at this time.    The patient will receive a drug information handout for each medication shipped and additional FDA Medication Guides as required.      Nylan did not have any additional questions at this time.    Delivery address confirmed in Epic.     We will follow up with patient monthly for standard refill processing and delivery.      Thank you,  Lupita Shutter   [facility] Specialty Pharmacist

## 2018-03-16 MED FILL — HUMIRA PEN CITRATE FREE 40 MG/0.4 ML: SUBCUTANEOUS | 28 days supply | Qty: 2 | Fill #0

## 2018-03-16 MED FILL — HUMIRA PEN CITRATE FREE 40 MG/0.4 ML: 28 days supply | Qty: 2 | Fill #0 | Status: AC

## 2018-03-17 NOTE — Unmapped (Signed)
vmail received from patient now re: having a hard time finding a lab to have blood work drawn for med monitoring requesting call back to discuss.    Return call attempted now. vmail left to advise her that orders were placed for [facility] collect as previously discussed.  I provided her with information on how to find a local [facility] near her ([email or web address] then enter zip code) there are several options around her, unclear if she did try this and they would not service her due to insurance or what?    Detailed information provided re: [facility] collect and to call me with any issues moving forward.

## 2018-04-06 MED ORDER — HUMIRA PEN CITRATE FREE 40 MG/0.4 ML
SUBCUTANEOUS | 3 refills | 0 days | Status: CP
Start: 2018-04-06 — End: 2018-05-13
  Filled 2018-04-13: qty 2, 28d supply, fill #0

## 2018-04-06 NOTE — Unmapped (Signed)
Patient contact attempted, voicemail left regarding need for azathioprine monitoring labs/follow up from my message left on 03/17/2018 providing several options for this and requesting call back if she has trouble finding a location for this.  No return call received thus far.       Patient provided with my return contact information. Call back requested for coordination

## 2018-04-06 NOTE — Unmapped (Signed)
[facility] Specialty Pharmacy Refill Coordination Note  Specialty Medication(s): Humira 40mg /0.5ml  Additional Medications shipped:      SHEELA MCCULLEY, DOB: 20-Sep-1990  Phone: [phone] (home) , Alternate phone contact: N/A  Phone or address changes today?: No  All above HIPAA information was verified with patient.  Shipping Address: [address]  [place] [address]   Insurance changes? No    Completed refill call assessment today to schedule patient's medication shipment from the [facility] [phone]).      Confirmed the medication and dosage are correct and have not changed: Yes, regimen is correct and unchanged.    Confirmed patient started or stopped the following medications in the past month:  No, there are no changes reported at this time.    Are you tolerating your medication?:  Kaleeah reports tolerating the medication.    ADHERENCE    (Below is required for Medicare Part B or Transplant patients only - per drug):   How many tablets were dispensed last month:  Patient currently has 0 remaining.    Did you miss any doses in the past 4 weeks? No missed doses reported.    FINANCIAL/SHIPPING    Delivery Scheduled: Yes, Expected medication delivery date: 020420     Medication will be delivered via UPS to the home address in [facility].    The patient will receive a drug information handout for each medication shipped and additional FDA Medication Guides as required.      Itzae did not have any additional questions at this time.    We will follow up with patient monthly for standard refill processing and delivery.      Thank you,  Antonietta Barcelona   [facility] Specialty Technician

## 2018-04-09 ENCOUNTER — Other Ambulatory Visit: Payer: Self-pay | Admitting: Neurology

## 2018-04-09 NOTE — Unmapped (Signed)
Patient contact attempted again, this is 3rd attempt thus far, voicemail left regarding need for labs and to offer assistance with obtaining azathioprine safety monitoring labs.  Will also send portal message now     Patient provided with my return contact information. Call back requested for any questions or concerns

## 2018-04-13 MED FILL — HUMIRA PEN CITRATE FREE 40 MG/0.4 ML: 28 days supply | Qty: 2 | Fill #0 | Status: AC

## 2018-05-08 NOTE — Unmapped (Signed)
Next dose due 05/13/18  Last dose on 04/29/18    [facility] Specialty Pharmacy Refill Coordination Note  Specialty Medication(s): Humira 40mg /0.44mL  Additional Medications shipped: none    Jasmin Duncan, DOB: [birth year]  Phone: [phone] (home) , Alternate phone contact: N/A  Phone or address changes today?: No  All above HIPAA information was verified with patient.  Shipping Address: [address]  [place] [address]   Insurance changes? No    Completed refill call assessment today to schedule patient's medication shipment from the [facility] [phone]).      Confirmed the medication and dosage are correct and have not changed: Yes, regimen is correct and unchanged.    Confirmed patient started or stopped the following medications in the past month:  No, there are no changes reported at this time.    Are you tolerating your medication?:  Jasmin Duncan reports tolerating the medication.    ADHERENCE    (Below is required for Medicare Part B or Transplant patients only - per drug):   How many tablets were dispensed last month: 2 pens  Patient currently has 0 remaining. Next dose due 05/13/18  Last dose on 04/29/18    Did you miss any doses in the past 4 weeks? No missed doses reported.    FINANCIAL/SHIPPING    Delivery Scheduled: Yes, Expected medication delivery date: 05/12/18     Medication will be delivered via UPS to the home address in [facility].    The patient will receive a drug information handout for each medication shipped and additional FDA Medication Guides as required.      Bronwyn requested delivery to be made after 9:00am.  She is aware we can not guarantee timing but will make a shipping note to deliver after 9am    We will follow up with patient monthly for standard refill processing and delivery.      Thank you,  Breck Coons [facility] Specialty Pharmacist

## 2018-05-11 MED FILL — HUMIRA PEN CITRATE FREE 40 MG/0.4 ML: SUBCUTANEOUS | 28 days supply | Qty: 2 | Fill #1

## 2018-05-11 MED FILL — HUMIRA PEN CITRATE FREE 40 MG/0.4 ML: 28 days supply | Qty: 2 | Fill #1 | Status: AC

## 2018-05-13 DIAGNOSIS — K5090 Crohn's disease, unspecified, without complications: Principal | ICD-10-CM

## 2018-05-13 MED ORDER — HUMIRA PEN CITRATE FREE 40 MG/0.4 ML
SUBCUTANEOUS | 3 refills | 0 days | Status: CP
Start: 2018-05-13 — End: 2018-06-01

## 2018-05-13 NOTE — Unmapped (Signed)
Due to numerous delivery issues with UPS, pt requests that we ask MD to send Humira rx to her local pharmacy, [facility].

## 2018-05-18 DIAGNOSIS — K50919 Crohn's disease, unspecified, with unspecified complications: Principal | ICD-10-CM

## 2018-05-18 NOTE — Progress Notes (Signed)
[facility]    Provider:  Shawnie Dapper, FNP-C Referring Provider: Roger Kill Primary Care Physician:  Care, Jovita Kussmaul [facility]   Chief Complaint  Patient presents with  . Follow-up    Migraine follow up. Son present. Rm 9. Patient mentioned that the Gabapentin helps with her migraines. Patient mentioned that she currently has a migraine due her being up all morning.     Interval history 05/19/2018: She reports that gabapentin has cut down on her headaches significantly. Headache days have reduced to about 1 per week. She has had  1-2 migraines per month. She is not currently using abortive therapy. She will rarely take  ibuprofen for a really bad headache. Tylenol hurts her stomach. She is not a good candidate for triptan therapy due to history of stroke. She has more stress with her daughter who is being worked up for possible seizures. She is caring for her 29 year old. She is not currently working.   MRI/MRV show chronic findings but otherwise unremarkable.   She was recently started on Humira for Crohn's Disease.    HPI:  Victoria Alvarez is a 28 y.o. female here as a referral from Dr. Care for Migraines.  Past medical history hypercoagulable disorder, depression, chronic pain, cerebral venous sinus thrombosis , obesity, portal vein thrombosis, pulmonary embolism. with infarction. symptoms come on slowly, she has a history of headaches. Her migraines are on the right side of the head, she can't function, worsening in the setting of stress and children. Startes behind the right eye and spreads, more in the front, pounding and pulsating and throbbing, +phono/photophobia, triggers are light and sound, no significant nausea with the headaches but she gets dizzy. She sees black spots in her vision when she has the headaches and when they are very severe. She is compliant on her Xarelto. She is having the headaches 3x a week, can be severe, sleep helps, on average 6  hours. No FHx migraines. Phenergan helps. Ibuprofen doesn;t help, tylenol doesn;t help. She has blurry vision and dizziness and had a loss of consciousness due to severe headache.  She denies weakness. No weakness. No other focal neurologic deficits, associated symptoms, inciting events or modifiable factors.  Reviewed notes, labs and imaging from outside physicians, which showed:   Reviewed referring physician notes the patient is seen for chronic pain syndrome and she is in the maintenance phase she is treated on morphine daily and notes improvement in physical functioning.  She has been completely compliant.  Concerning headache, location is primarily temporal and behind the eyes, she has had prior headaches similar to this but the frequency is quite variable and duration is usually quite variable.  Severe aching and pounding, photophobia and phonophobia, headache is exacerbated with exposure to bright light and loud noise, improved with dim lights quite surroundings NSAIDs and narcotics.  Pertinent past medical history includes CVA.  Review of Systems: Patient complains of symptoms per HPI as well as the following symptoms headache, chronic pain, diarrhea, nausea. Pertinent negatives and positives per HPI. All others negative.   Social History   Socioeconomic History  . Marital status: Single    Spouse name: Not on file  . Number of children: 2  . Years of education: 9  . Highest education level: Not on file  Occupational History  . Occupation: homemaker  Social Needs  . Financial resource strain: Not on file  . Food insecurity:    Worry: Not on file    Inability:  Not on file  . Transportation needs:    Medical: Not on file    Non-medical: Not on file  Tobacco Use  . Smoking status: Current Every Day Smoker    Packs/day: 0.25    Years: 8.00    Pack years: 2.00    Types: Cigarettes  . Smokeless tobacco: Never Used  . Tobacco comment: cutting back  Substance and Sexual Activity    . Alcohol use: No  . Drug use: No    Comment: Pt on Methadone "was on pain pills; weaned off while I was pregnant so it would be safer"  . Sexual activity: Yes    Birth control/protection: None  Lifestyle  . Physical activity:    Days per week: Not on file    Minutes per session: Not on file  . Stress: Not on file  Relationships  . Social connections:    Talks on phone: Not on file    Gets together: Not on file    Attends religious service: Not on file    Active member of club or organization: Not on file    Attends meetings of clubs or organizations: Not on file    Relationship status: Not on file  . Intimate partner violence:    Fear of current or ex partner: Not on file    Emotionally abused: Not on file    Physically abused: Not on file    Forced sexual activity: Not on file  Other Topics Concern  . Not on file  Social History Narrative   Has significant other.  Children 2.  Occupation: Arts development officer.   Right handed   Caffeine use - none    Family History  Problem Relation Age of Onset  . Diabetes Mother   . Heart disease Mother   . Anxiety disorder Mother   . Depression Mother   . Hypertension Father   . CAD Father   . Diabetes Maternal Grandmother   . Hypertension Maternal Grandmother   . Stroke Maternal Grandmother   . Hypothyroidism Maternal Grandmother   . Diabetes Maternal Grandfather   . Hypertension Maternal Grandfather   . Diabetes Paternal Grandmother   . Diabetes Paternal Grandfather   . Hypertension Paternal Grandfather     Past Medical History:  Diagnosis Date  . Anemia 07/2014   Microcytic  . Asthma   . Cerebral vein thrombosis 06/12/2014  . Cerebral venous sinus thrombosis   . Chronic pelvic pain in female   . Depression 2007  . Headache(784.0)   . Hypercoagulable state, primary (HCC) 07/2014   . no clear inherited or acquired cause for the patient's coagulopathy  . Interstitial cystitis    pain from this was reason for starting opioids age  67.   . Miscarriage 2005  . Morbid obesity (HCC)    BMI 37 in 11/2014.  . Non-compliance    with anticoagulant  . Numbness    right side  . Opioid dependence (HCC)    to Rx opioids.  switched to Methadone after second child born in 04/2014  . 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 Pneumonia 03/2014  . 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 Preeclampsia 2012  . Pulmonary embolism (HCC)   . Pyelonephritis affecting pregnancy in first trimester July 2015  . Seizures (HCC)   . Stroke [facility]) 05/2014   presented with right sided weakness.     Past Surgical History:  Procedure Laterality Date  . ABDOMINAL SURGERY    . CESAREAN SECTION N/A 05/06/2014  Procedure: CESAREAN SECTION;  Surgeon: Levie Heritage, DO;  Location: [facility] ORS;  Service: Obstetrics;  Laterality: N/A;  . COLONOSCOPY WITH PROPOFOL N/A 11/18/2014   Procedure: COLONOSCOPY WITH PROPOFOL;  Surgeon: Rachael Fee, MD;  Location: [facility] ENDOSCOPY;  Service: Endoscopy;  Laterality: N/A;  . INGUINAL HERNIA REPAIR Left 2008  . VAGINA SURGERY  ~ 2011   benign vaginal tumor removed at [facility].     Current Outpatient Medications  Medication Sig Dispense Refill  . gabapentin (NEURONTIN) 300 MG capsule TAKE 1 CAPSULE BY MOUTH 3 TIMES DAILY. 90 capsule 1  . ondansetron (ZOFRAN) 4 MG tablet Take 4 mg by mouth every 6 (six) hours as needed for nausea or vomiting.    [name] oxyCODONE (ROXICODONE) 15 MG immediate release tablet Take 15 mg by mouth every 4 (four) hours as needed for pain.    . promethazine (PHENERGAN) 25 MG tablet Take 25 mg by mouth at bedtime as needed for nausea or vomiting.     . rivaroxaban (XARELTO) 20 MG TABS tablet Take 20 mg by mouth at bedtime.     [name] ibuprofen (ADVIL,MOTRIN) 200 MG tablet Take 200 mg by mouth daily as needed for headache.    . loratadine (CLARITIN) 10 MG tablet Take 10 mg by mouth daily as needed (seasonal allergies).     No current facility-administered medications for this visit.     Allergies as of 05/19/2018  - Review Complete 05/19/2018  Allergen Reaction Noted  . Bee venom Swelling 06/16/2014    Vitals: BP 134/80   Pulse 87   Ht 5\' 2"  (1.575 m)   Wt 190 lb (86.2 kg)   BMI 34.75 kg/m  Last Weight:  Wt Readings from Last 1 Encounters:  05/19/18 190 lb (86.2 kg)   Last Height:   Ht Readings from Last 1 Encounters:  05/19/18 5\' 2"  (1.575 m)    Physical exam: Exam: Gen: NAD, conversant, well nourised, obese, well groomed                     CV: RRR, no MRG. No Carotid Bruits. No peripheral edema, warm, nontender Eyes: Conjunctivae clear without exudates or hemorrhage  Neuro: Detailed Neurologic Exam  Speech:    Speech is normal; fluent and spontaneous with normal comprehension.  Cognition:    The patient is oriented to person, place, and time;     recent and remote memory intact;     language fluent;     normal attention, concentration,     fund of knowledge Cranial Nerves:    The pupils are equal, round, and reactive to light. The fundi are normal and spontaneous venous pulsations are present. Visual fields are full to finger confrontation. Extraocular movements are intact. Trigeminal sensation is intact and the muscles of mastication are normal. The face is symmetric. The palate elevates in the midline. Hearing intact. Voice is normal. Shoulder shrug is normal. The tongue has normal motion without fasciculations.   Coordination:    Normal finger to nose and heel to shin. Normal rapid alternating movements.   Gait:    Heel-toe and tandem gait are normal.   Motor Observation:    No asymmetry, no atrophy, and no involuntary movements noted. Tone:    Normal muscle tone.    Posture:    Posture is normal. normal erect    Strength:    Strength is V/V in the upper and lower limbs.      Sensation: intact to LT  Assessment/Plan:  28 year old with past medical history hypercoagulable disorder, more than likely these are migraines. PMHx depression, chronic pain,  cerebral venous sinus thrombosis , obesity, portal vein thrombosis, pulmonary embolism. with infarction.    1. Migraines    MRI brain w/wo contrast and MRV of the head shows chronic findings, no acute changes  She is doing well with gabapentin. She has significantly less migraines each month. We will continue gabapentin 300mg  TID. I have advised that she try to wean oxycodone as tolerated once Humira is working well. She will also try Ubrelvy for abortive therapy. We will attempt to get insurance coverage as she is not a candidate for triptan or Nsaid therapy. She will follow up in 6 months, sooner if needed.   No orders of the defined types were placed in this encounter.  No orders of the defined types were placed in this encounter.   Discussed: To prevent or relieve headaches, try the following: Cool Compress. Lie down and place a cool compress on your head.  Avoid headache triggers. If certain foods or odors seem to have triggered your migraines in the past, avoid them. A headache diary might help you identify triggers.  Include physical activity in your daily routine. Try a daily walk or other moderate aerobic exercise.  Manage stress. Find healthy ways to cope with the stressors, such as delegating tasks on your to-do list.  Practice relaxation techniques. Try deep breathing, yoga, massage and visualization.  Eat regularly. Eating regularly scheduled meals and maintaining a healthy diet might help prevent headaches. Also, drink plenty of fluids.  Follow a regular sleep schedule. Sleep deprivation might contribute to headaches Consider biofeedback. With this mind-body technique, you learn to control certain bodily functions - such as muscle tension, heart rate and blood pressure - to prevent headaches or reduce headache pain.    Proceed to emergency room if you experience new or worsening symptoms or symptoms do not resolve, if you have new neurologic symptoms or if headache is severe, or  for any concerning symptom.   Provided education and documentation from American headache Society toolbox including articles on: chronic migraine medication overuse headache, chronic migraines, prevention of migraines, behavioral and other nonpharmacologic treatments for headache.  Discussed:  There is increased risk for stroke in women with migraine with aura and a contraindication for the combined contraceptive pill for use by women who have migraine with aura, which is in line with World Health Organisation recommendations. The risk for women with migraine without aura is lower and other risk factors like smoking are far more likely to increase stroke risk than migraine. There is a recommendation for no smoking and for the use of low estrogen or progestogen only pills particularly for women with migraine with aura. It is important however that women with migraine who are taking the pill do not decide to suddenly stop taking it without discussing this with their doctor. Please discuss with her OB/GYN.  Shawnie Dapper, FNP-C  [facility] [address] [place], [place] [address]  Phone [phone] Fax [phone]

## 2018-05-19 ENCOUNTER — Ambulatory Visit: Payer: Medicaid Other | Admitting: Family Medicine

## 2018-05-19 ENCOUNTER — Encounter: Payer: Self-pay | Admitting: Family Medicine

## 2018-05-19 VITALS — BP 134/80 | HR 87 | Ht 62.0 in | Wt 190.0 lb

## 2018-05-19 DIAGNOSIS — G43109 Migraine with aura, not intractable, without status migrainosus: Secondary | ICD-10-CM | POA: Insufficient documentation

## 2018-05-19 MED ORDER — UBROGEPANT 50 MG PO TABS: 50.0000 mg | ORAL_TABLET | Freq: Every day | ORAL | 11 refills | Status: DC | PRN

## 2018-05-19 MED ORDER — GABAPENTIN 300 MG PO CAPS: 300.0000 mg | ORAL_CAPSULE | Freq: Three times a day (TID) | ORAL | 11 refills | Status: DC

## 2018-05-19 NOTE — Progress Notes (Signed)
Made any corrections needed, and agree with history, physical, neuro exam,assessment and plan as stated.     Kenyatta Gloeckner, MD [facility]  

## 2018-05-19 NOTE — Patient Instructions (Signed)
Continue gabapentin 310m three times daily   Ubrelvy as prescribed (take one tablet at onset of headache, may take 1 additional tablet 2 hours later, no more than 2 tablets in 24 hours or 10 tablets a month)  Migraine Headache  A migraine headache is a very strong throbbing pain on one side or both sides of your head. Migraines can also cause other symptoms. Talk with your doctor about what things may bring on (trigger) your migraine headaches. Follow these instructions at home: Medicines  Take over-the-counter and prescription medicines only as told by your doctor.  Do not drive or use heavy machinery while taking prescription pain medicine.  To prevent or treat constipation while you are taking prescription pain medicine, your doctor may recommend that you: ? Drink enough fluid to keep your pee (urine) clear or pale yellow. ? Take over-the-counter or prescription medicines. ? Eat foods that are high in fiber. These include fresh fruits and vegetables, whole grains, and beans. ? Limit foods that are high in fat and processed sugars. These include fried and sweet foods. Lifestyle  Avoid alcohol.  Do not use any products that contain nicotine or tobacco, such as cigarettes and e-cigarettes. If you need help quitting, ask your doctor.  Get at least 8 hours of sleep every night.  Limit your stress. General instructions   Keep a journal to find out what may bring on your migraines. For example, write down: ? What you eat and drink. ? How much sleep you get. ? Any change in what you eat or drink. ? Any change in your medicines.  If you have a migraine: ? Avoid things that make your symptoms worse, such as bright lights. ? It may help to lie down in a dark, quiet room. ? Do not drive or use heavy machinery. ? Ask your doctor what activities are safe for you.  Keep all follow-up visits as told by your doctor. This is important. Contact a doctor if:  You get a migraine that is  different or worse than your usual migraines. Get help right away if:  Your migraine gets very bad.  You have a fever.  You have a stiff neck.  You have trouble seeing.  Your muscles feel weak or like you cannot control them.  You start to lose your balance a lot.  You start to have trouble walking.  You pass out (faint). This information is not intended to replace advice given to you by your health care provider. Make sure you discuss any questions you have with your health care provider. Document Released: 12/05/2007 Document Revised: 11/19/2017 Document Reviewed: 08/14/2015 Elsevier Interactive Patient Education  2019 [employer]

## 2018-05-20 ENCOUNTER — Encounter: Payer: Self-pay | Admitting: Family Medicine

## 2018-05-20 ENCOUNTER — Telehealth: Payer: Self-pay

## 2018-05-20 NOTE — Telephone Encounter (Signed)
Pending approval for Bernita Raisin 50mg  has been faxed to [place] [facility] at [phone]. Confirmation fax has been received. I will update once a decision has been made.

## 2018-05-25 NOTE — Unmapped (Addendum)
Incoming call received from patient now to advise that she is pooping on herself all of a sudden. per patient, she woke up this morning and liquid stool in her bed, later she found to have had an episode of leakage and was not aware this occurred.    In general, she reports increasing BMs today that are very concerning for her. Of note, she endorses having a double ear infection recently and was placed on Antibiotic: amox/clav   She will stop the amox/clav for now to see if symptoms improve and update [place] PRN. If she is feeling worse, she will report to ER for IV fluids and stool specimens to rule out infection    Patient verbalized understanding - Dr. Zachery Dauer notified also and in agreement with POC

## 2018-06-01 DIAGNOSIS — K5090 Crohn's disease, unspecified, without complications: Principal | ICD-10-CM

## 2018-06-01 MED ORDER — HUMIRA PEN CITRATE FREE 40 MG/0.4 ML
SUBCUTANEOUS | 3 refills | 0 days | Status: CP
Start: 2018-06-01 — End: 2018-06-01

## 2018-06-09 DIAGNOSIS — K5090 Crohn's disease, unspecified, without complications: Principal | ICD-10-CM

## 2018-06-09 MED ORDER — HUMIRA PEN CITRATE FREE 40 MG/0.4 ML
SUBCUTANEOUS | 3 refills | 0 days | Status: CP
Start: 2018-06-09 — End: 2018-08-27

## 2018-07-21 ENCOUNTER — Other Ambulatory Visit: Payer: Self-pay | Admitting: Neurology

## 2018-07-29 ENCOUNTER — Telehealth: Admit: 2018-07-29 | Discharge: 2018-07-30 | Payer: MEDICAID

## 2018-07-29 DIAGNOSIS — K50919 Crohn's disease, unspecified, with unspecified complications: Secondary | ICD-10-CM

## 2018-07-29 DIAGNOSIS — K5000 Crohn's disease of small intestine without complications: Principal | ICD-10-CM

## 2018-08-14 MED ORDER — MERCAPTOPURINE 50 MG TABLET
ORAL_TABLET | Freq: Every day | ORAL | 1 refills | 0.00000 days | Status: CP
Start: 2018-08-14 — End: 2018-09-13

## 2018-08-27 ENCOUNTER — Ambulatory Visit: Admit: 2018-08-27 | Discharge: 2018-08-28 | Payer: MEDICAID

## 2018-08-27 DIAGNOSIS — K50018 Crohn's disease of small intestine with other complication: Principal | ICD-10-CM

## 2018-09-14 ENCOUNTER — Ambulatory Visit: Admit: 2018-09-14 | Discharge: 2018-09-15 | Payer: MEDICAID

## 2018-09-14 DIAGNOSIS — K50018 Crohn's disease of small intestine with other complication: Principal | ICD-10-CM

## 2018-10-12 ENCOUNTER — Ambulatory Visit: Admit: 2018-10-12 | Discharge: 2018-10-13 | Payer: MEDICAID

## 2018-10-12 DIAGNOSIS — K50018 Crohn's disease of small intestine with other complication: Principal | ICD-10-CM

## 2018-11-03 ENCOUNTER — Telehealth: Admit: 2018-11-03 | Discharge: 2018-11-04 | Payer: MEDICAID

## 2018-11-20 ENCOUNTER — Emergency Department (HOSPITAL_COMMUNITY)
Admission: EM | Admit: 2018-11-20 | Discharge: 2018-11-20 | Discharge status: Against Medical Advice | Payer: Medicaid Other

## 2018-11-20 ENCOUNTER — Encounter (HOSPITAL_COMMUNITY): Payer: Self-pay | Admitting: Emergency Medicine

## 2018-11-20 ENCOUNTER — Other Ambulatory Visit: Payer: Self-pay

## 2018-11-20 LAB — CBC
HCT: 48.9 % — ABNORMAL HIGH (ref 36.0–46.0)
Hemoglobin: 17.1 g/dL — ABNORMAL HIGH (ref 12.0–15.0)
MCH: 33.5 pg (ref 26.0–34.0)
MCHC: 35 g/dL (ref 30.0–36.0)
MCV: 95.7 fL (ref 80.0–100.0)
Platelets: 171 10*3/uL (ref 150–400)
RBC: 5.11 MIL/uL (ref 3.87–5.11)
RDW: 11.7 % (ref 11.5–15.5)
WBC: 4.8 10*3/uL (ref 4.0–10.5)
nRBC: 0 % (ref 0.0–0.2)

## 2018-11-20 LAB — COMPREHENSIVE METABOLIC PANEL
ALT: 26 U/L (ref 0–44)
AST: 23 U/L (ref 15–41)
Albumin: 4.1 g/dL (ref 3.5–5.0)
Alkaline Phosphatase: 46 U/L (ref 38–126)
Anion gap: 9 (ref 5–15)
BUN: 5 mg/dL — ABNORMAL LOW (ref 6–20)
CO2: 25 mmol/L (ref 22–32)
Calcium: 9.4 mg/dL (ref 8.9–10.3)
Chloride: 108 mmol/L (ref 98–111)
Creatinine, Ser: 0.97 mg/dL (ref 0.44–1.00)
GFR calc Af Amer: >60 mL/min (ref >60)
GFR calc non Af Amer: >60 mL/min (ref >60)
Glucose, Bld: 93 mg/dL (ref 70–99)
Potassium: 3.3 mmol/L — ABNORMAL LOW (ref 3.5–5.1)
Sodium: 142 mmol/L (ref 135–145)
Total Bilirubin: 3.2 mg/dL — ABNORMAL HIGH (ref 0.3–1.2)
Total Protein: 6.6 g/dL (ref 6.5–8.1)

## 2018-11-20 LAB — I-STAT BETA HCG BLOOD, ED (MC, WL, AP ONLY): I-stat hCG, quantitative: <5 m[IU]/mL (ref <5)

## 2018-11-20 LAB — LIPASE, BLOOD: Lipase: 38 U/L (ref 11–51)

## 2018-11-20 MED ORDER — SODIUM CHLORIDE 0.9% FLUSH: 3.0000 mL | Freq: Once | INTRAVENOUS | Status: DC

## 2018-11-20 NOTE — ED Notes (Signed)
Called patient x2 for vitals with no pt response.

## 2018-11-20 NOTE — ED Notes (Signed)
Called patient to reassess vitals x3 and had no answer. 

## 2018-11-20 NOTE — ED Triage Notes (Signed)
Pt reports hx of chron's disease. Has been having nausea/vomiting with the pain, can't keep prescribed pain meds down or nausea meds, feels dehydrated. VSS. No recent fevers per patient.

## 2018-11-20 NOTE — ED Notes (Signed)
Called for room x3 with no response from pt.

## 2018-12-07 ENCOUNTER — Ambulatory Visit: Admit: 2018-12-07 | Discharge: 2018-12-08 | Payer: MEDICAID

## 2018-12-07 DIAGNOSIS — K50018 Crohn's disease of small intestine with other complication: Secondary | ICD-10-CM

## 2018-12-14 ENCOUNTER — Other Ambulatory Visit: Payer: Self-pay

## 2018-12-14 ENCOUNTER — Ambulatory Visit (INDEPENDENT_AMBULATORY_CARE_PROVIDER_SITE_OTHER): Payer: Medicaid Other | Admitting: *Deleted

## 2018-12-14 DIAGNOSIS — Z3200 Encounter for pregnancy test, result unknown: Secondary | ICD-10-CM

## 2018-12-14 DIAGNOSIS — Z3202 Encounter for pregnancy test, result negative: Secondary | ICD-10-CM | POA: Diagnosis not present

## 2018-12-14 LAB — POCT PREGNANCY, URINE: Preg Test, Ur: NEGATIVE

## 2018-12-14 NOTE — Progress Notes (Signed)
Pt informed of negative UPT. She reports LMP 10/18/18. She also states that she had +UPT @ home on 9/15. She was having normal symptoms of pregnancy including nausea and sore breasts. She began having bleeding with clots yesterday as well as abdominal cramping and pelvic pressure. Pain scale @ present is 4-5. She states the bleeding is heavier than a normal period for her. Consult with Dr. Marice Potter and plan of care is for [facility] today. Pt will be notified of results. Pt was advised to go to [facility] if her bleeding becomes very heavy, if she passes large clots or tissue or if her abdominal/pelvic pain increases. Pt voiced understanding of all information and instructions given.

## 2018-12-15 ENCOUNTER — Telehealth: Admit: 2018-12-15 | Discharge: 2018-12-16 | Payer: MEDICAID | Attending: Hematology | Primary: Hematology

## 2018-12-15 DIAGNOSIS — G08 Intracranial and intraspinal phlebitis and thrombophlebitis: Secondary | ICD-10-CM

## 2018-12-15 DIAGNOSIS — K50018 Crohn's disease of small intestine with other complication: Secondary | ICD-10-CM

## 2018-12-15 DIAGNOSIS — I81 Portal vein thrombosis: Secondary | ICD-10-CM

## 2018-12-15 DIAGNOSIS — I2699 Other pulmonary embolism without acute cor pulmonale: Secondary | ICD-10-CM

## 2018-12-15 LAB — BETA HCG QUANT (REF LAB): hCG Quant: <1 m[IU]/mL

## 2018-12-15 MED ORDER — RIVAROXABAN 20 MG TABLET
ORAL_TABLET | Freq: Every day | ORAL | 11 refills | 30 days | Status: CP
Start: 2018-12-15 — End: ?

## 2019-01-19 ENCOUNTER — Ambulatory Visit: Admit: 2019-01-19 | Discharge: 2019-01-20 | Payer: MEDICAID

## 2019-01-19 DIAGNOSIS — K5010 Crohn's disease of large intestine without complications: Principal | ICD-10-CM

## 2019-01-19 DIAGNOSIS — K50018 Crohn's disease of small intestine with other complication: Principal | ICD-10-CM

## 2019-02-01 MED ORDER — ENOXAPARIN 80 MG/0.8 ML SUBCUTANEOUS SYRINGE
INJECTION | Freq: Two times a day (BID) | SUBCUTANEOUS | 11 refills | 0.00000 days | Status: CP
Start: 2019-02-01 — End: ?

## 2019-02-23 ENCOUNTER — Ambulatory Visit (INDEPENDENT_AMBULATORY_CARE_PROVIDER_SITE_OTHER): Payer: Medicaid Other

## 2019-02-23 ENCOUNTER — Other Ambulatory Visit: Payer: Self-pay

## 2019-02-23 VITALS — Wt 175.0 lb

## 2019-02-23 DIAGNOSIS — Z3202 Encounter for pregnancy test, result negative: Secondary | ICD-10-CM | POA: Diagnosis present

## 2019-02-23 DIAGNOSIS — Z3200 Encounter for pregnancy test, result unknown: Secondary | ICD-10-CM

## 2019-02-23 LAB — POCT PREGNANCY, URINE: Preg Test, Ur: NEGATIVE

## 2019-02-23 NOTE — Progress Notes (Signed)
UPT - States she is over 2 weeks late states she was supposed to start her cycle on the 23rd. And states she's on blood thinners from a different diagnosis. States she wants a hcg non stats. States she will make a gyn appointment if this is negative  To see what the issue is. Patient is on Lovenox.

## 2019-02-24 LAB — BETA HCG QUANT (REF LAB): hCG Quant: <1 m[IU]/mL

## 2019-03-02 ENCOUNTER — Ambulatory Visit: Admit: 2019-03-02 | Discharge: 2019-03-03 | Payer: MEDICAID

## 2019-03-02 DIAGNOSIS — K50018 Crohn's disease of small intestine with other complication: Principal | ICD-10-CM

## 2019-04-02 ENCOUNTER — Telehealth: Payer: Self-pay

## 2019-04-02 NOTE — Telephone Encounter (Signed)
1) Medication(s) Requested (by name): gabapentin 2) Pharmacy of Choice: [facility]

## 2019-04-05 DIAGNOSIS — Z0271 Encounter for disability determination: Secondary | ICD-10-CM

## 2019-04-05 MED ORDER — GABAPENTIN 300 MG PO CAPS: 300.0000 mg | ORAL_CAPSULE | Freq: Three times a day (TID) | ORAL | 11 refills | Status: DC

## 2019-04-06 ENCOUNTER — Telehealth: Payer: Self-pay | Admitting: Family Medicine

## 2019-04-06 ENCOUNTER — Telehealth: Payer: Medicaid Other | Admitting: Family Medicine

## 2019-04-06 ENCOUNTER — Ambulatory Visit: Payer: Medicaid Other | Admitting: Family Medicine

## 2019-04-06 NOTE — Telephone Encounter (Signed)
Patient did not long in for [place] visit. I called to check in and patient is having a difficult time with getting audio and video access on her computer. She prefers to come in for office visit. She is having a breakthrough migraine today. Pain has been present for 3-4 days. She continues gabapentin 362m three times daily. This is her first migraine in months. She feels that stress and recent weather changes have been triggers for her. Migraine pain is typical, pressure sensation behind eye. No other concerning symptoms. Pain waxes and wanes. She has not gotten relief with ibuprofen as she usually does. She is not a good candidate for triptan therapy due to previous history of stroke. We have tried to get URoselyn Meiercovered but denies by insurance. She has stopped taking her antihistamines. I have advised the she take 4085mibuprofen with 5030mf benadryl and resume Claritin daily for now. She will continue adequate hydration. We have refilled gabapentin and scheduled follow up for 2/3 in office. She will call for worsening or unresolved symptoms. She verbalizes understanding and agreement with this plan.

## 2019-04-07 NOTE — Telephone Encounter (Signed)
I called and cancelled the refills at [place] (11 refills). Pt has appt 04-14-19.

## 2019-04-13 ENCOUNTER — Ambulatory Visit: Admit: 2019-04-13 | Discharge: 2019-04-14 | Payer: MEDICAID

## 2019-04-13 DIAGNOSIS — K50018 Crohn's disease of small intestine with other complication: Principal | ICD-10-CM

## 2019-04-14 ENCOUNTER — Ambulatory Visit: Payer: Medicaid Other | Admitting: Family Medicine

## 2019-05-04 ENCOUNTER — Other Ambulatory Visit: Payer: Self-pay

## 2019-05-04 ENCOUNTER — Ambulatory Visit: Payer: Medicaid Other | Admitting: Family Medicine

## 2019-05-04 ENCOUNTER — Encounter: Payer: Self-pay | Admitting: Family Medicine

## 2019-05-04 VITALS — BP 119/78 | HR 79 | Temp 98.3°F | Ht 61.0 in | Wt 177.0 lb

## 2019-05-04 DIAGNOSIS — G43109 Migraine with aura, not intractable, without status migrainosus: Secondary | ICD-10-CM

## 2019-05-04 MED ORDER — NURTEC 75 MG PO TBDP: 75.0000 mg | ORAL_TABLET | Freq: Every day | ORAL | 11 refills | Status: DC | PRN

## 2019-05-04 MED ORDER — GABAPENTIN 300 MG PO CAPS: 300.0000 mg | ORAL_CAPSULE | Freq: Three times a day (TID) | ORAL | 11 refills | Status: DC

## 2019-05-04 NOTE — Progress Notes (Addendum)
PATIENT: Victoria Alvarez DOB: 09/17/1990  REASON FOR VISIT: follow up HISTORY FROM: patient  Chief Complaint  Patient presents with  . Follow-up    Tx Rm. Alone. States that she is having some anxieties over her medications.     HISTORY OF PRESENT ILLNESS: Today 05/04/19 Victoria Alvarez is a 29 y.o. female here today for follow up for migraines. She continues gabapentin 338m three times daily. She admits that she sometimes forgets to take lunch dose. She reports having about 5 migraines per month. She states that headaches are always migrainous. We tried to get U[place] Meiercovered by her insurance before COVID but she reports that she did not hear anything back from [place] pharmacy told her it was not covered. She is seen monthly by pain management for Crohn's Disease treated with Remicaid infusions every 6 weeks and oxycodone 168mevery 4 hours as needed. He daughter has recently gone back to school which has relieved some stress.    HISTORY: (copied from my note on 05/19/2018)  Interval history 05/19/2018: She reports that gabapentin has cut down on her headaches significantly. Headache days have reduced to about 1 per week. She has had  1-2 migraines per month. She is not currently using abortive therapy. She will rarely take 40076mbuprofen for a really bad headache. Tylenol hurts her stomach. She is not a good candidate for triptan therapy due to history of stroke. She has more stress with her daughter who is being worked up for possible seizures. She is caring for her 3 y65ar old. She is not currently working.   MRI/MRV show chronic findings but otherwise unremarkable.   She was recently started on Humira for Crohn's Disease.    HPI:  Victoria Alvarez a [age]o. female here as a referral from Dr. Care for Migraines.  Past medical history hypercoagulable disorder, depression, chronic pain, cerebral venous sinus thrombosis , obesity, portal vein thrombosis, pulmonary embolism. with  infarction. symptoms come on slowly, she has a history of headaches. Her migraines are on the right side of the head, she can't function, worsening in the setting of stress and children. Startes behind the right eye and spreads, more in the front, pounding and pulsating and throbbing, +phono/photophobia, triggers are light and sound, no significant nausea with the headaches but she gets dizzy. She sees black spots in her vision when she has the headaches and when they are very severe. She is compliant on her Xarelto. She is having the headaches 3x a week, can be severe, sleep helps, on average 6 hours. No FHx migraines. Phenergan helps. Ibuprofen doesn;t help, tylenol doesn;t help. She has blurry vision and dizziness and had a loss of consciousness due to severe headache.  She denies weakness. No weakness. No other focal neurologic deficits, associated symptoms, inciting events or modifiable factors.  Reviewed notes, labs and imaging from outside physicians, which showed:   Reviewed referring physician notes the patient is seen for chronic pain syndrome and she is in the maintenance phase she is treated on morphine daily and notes improvement in physical functioning.  She has been completely compliant.  Concerning headache, location is primarily temporal and behind the eyes, she has had prior headaches similar to this but the frequency is quite variable and duration is usually quite variable.  Severe aching and pounding, photophobia and phonophobia, headache is exacerbated with exposure to bright light and loud noise, improved with dim lights quite surroundings NSAIDs and narcotics.  Pertinent past medical history  includes CVA.   REVIEW OF SYSTEMS: Out of a complete 14 system review of symptoms, the patient complains only of the following symptoms, headaches and all other reviewed systems are negative.  ALLERGIES: Allergies  Allergen Reactions  . Bee Venom Swelling    Worse reaction if stung from the  neck up Worse reaction if stung from the neck up    HOME MEDICATIONS: Outpatient Medications Prior to Visit  Medication Sig Dispense Refill  . enoxaparin (LOVENOX) 80 MG/0.8ML injection Inject into the skin.    [name] ondansetron (ZOFRAN) 4 MG tablet Take 4 mg by mouth every 6 (six) hours as needed for nausea or vomiting.    [name] oxyCODONE (ROXICODONE) 15 MG immediate release tablet Take 15 mg by mouth every 4 (four) hours as needed for pain.    . promethazine (PHENERGAN) 25 MG tablet Take 25 mg by mouth at bedtime as needed for nausea or vomiting.     [name] UNABLE TO FIND Med Name: Emergen-c Gummies OTC    . gabapentin (NEURONTIN) 300 MG capsule Take 1 capsule (300 mg total) by mouth 3 (three) times daily. 90 capsule 11  . rivaroxaban (XARELTO) 20 MG TABS tablet Take 20 mg by mouth at bedtime.      No facility-administered medications prior to visit.    PAST MEDICAL HISTORY: Past Medical History:  Diagnosis Date  . Anemia 07/2014   Microcytic  . Asthma   . Cerebral vein thrombosis 06/12/2014  . Cerebral venous sinus thrombosis   . Chronic pelvic pain in female   . Depression 2007  . Headache(784.0)   . Hypercoagulable state, primary ([place]) 07/2014   . no clear inherited or acquired cause for the patient's coagulopathy  . Interstitial cystitis    pain from this was reason for starting opioids age 55.   . Miscarriage 2005  . Morbid obesity ([place])    BMI 37 in 11/2014.  . Non-compliance    with anticoagulant  . Numbness    right side  . Opioid dependence ([place])    to Rx opioids.  switched to Methadone after second child born in 04/2014  . 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 Pneumonia 03/2014  . 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 Preeclampsia 2012  . Pulmonary embolism ([place])   . Pyelonephritis affecting pregnancy in first trimester July 2015  . Seizures ([place])   . Stroke [facility]) 05/2014   presented with right sided weakness.     PAST SURGICAL HISTORY: Past Surgical History:  Procedure  Laterality Date  . ABDOMINAL SURGERY    . CESAREAN SECTION N/A 05/06/2014   Procedure: CESAREAN SECTION;  Surgeon: Truett Mainland, DO;  Location: [facility];  Service: Obstetrics;  Laterality: N/A;  . COLONOSCOPY WITH PROPOFOL N/A 11/18/2014   Procedure: COLONOSCOPY WITH PROPOFOL;  Surgeon: Milus Banister, MD;  Location: [facility];  Service: Endoscopy;  Laterality: N/A;  . INGUINAL HERNIA REPAIR Left 2008  . VAGINA SURGERY  ~ 2011   benign vaginal tumor removed at [place]: Family History  Problem Relation Age of Onset  . Diabetes Mother   . Heart disease Mother   . Anxiety disorder Mother   . Depression Mother   . Hypertension Father   . CAD Father   . Diabetes Maternal Grandmother   . Hypertension Maternal Grandmother   . Stroke Maternal Grandmother   . Hypothyroidism Maternal Grandmother   . Diabetes Maternal Grandfather   . Hypertension Maternal Grandfather   .  Diabetes Paternal Grandmother   . Diabetes Paternal Grandfather   . Hypertension Paternal Grandfather     SOCIAL HISTORY: Social History   Socioeconomic History  . Marital status: Single    Spouse name: Not on file  . Number of children: 2  . Years of education: 9  . Highest education level: Not on file  Occupational History  . Occupation: homemaker  Tobacco Use  . Smoking status: Current Every Day Smoker    Packs/day: 0.25    Years: 8.00    Pack years: 2.00    Types: Cigarettes  . Smokeless tobacco: Never Used  . Tobacco comment: cutting back  Substance and Sexual Activity  . Alcohol use: No  . Drug use: No    Comment: Pt on Methadone "was on pain pills; weaned off while I was pregnant so it would be safer"  . Sexual activity: Yes    Birth control/protection: None  Other Topics Concern  . Not on file  Social History Narrative   Has significant other.  Children 2.  Occupation: Materials engineer.   Right handed   Caffeine use - none   Social Determinants of Health   Financial  Resource Strain:   . Difficulty of Paying Living Expenses: Not on file  Food Insecurity:   . Worried About Charity fundraiser in the Last Year: Not on file  . Ran Out of Food in the Last Year: Not on file  Transportation Needs:   . Lack of Transportation (Medical): Not on file  . Lack of Transportation (Non-Medical): Not on file  Physical Activity:   . Days of Exercise per Week: Not on file  . Minutes of Exercise per Session: Not on file  Stress:   . Feeling of Stress : Not on file  Social Connections:   . Frequency of Communication with Friends and Family: Not on file  . Frequency of Social Gatherings with Friends and Family: Not on file  . Attends Religious Services: Not on file  . Active Member of Clubs or Organizations: Not on file  . Attends Archivist Meetings: Not on file  . Marital Status: Not on file  Intimate Partner Violence:   . Fear of Current or Ex-Partner: Not on file  . Emotionally Abused: Not on file  . Physically Abused: Not on file  . Sexually Abused: Not on file      PHYSICAL EXAM  Vitals:   05/04/19 1436  BP: 119/78  Pulse: 79  Temp: 98.3 F (36.8 C)  Weight: 177 lb (80.3 kg)  Height: 5' 1"  (1.549 m)   Body mass index is 33.44 kg/m.  Generalized: Well developed, in no acute distress  Cardiology: normal rate and rhythm, no murmur noted Respiratory: Clear to auscultation bilaterally  Neurological examination  Mentation: Alert oriented to time, place, history taking. Follows all commands speech and language fluent Cranial nerve II-XII: Pupils were equal round reactive to light. Extraocular movements were full, visual field were full on confrontational test. Facial sensation and strength were normal. Uvula tongue midline. Head turning and shoulder shrug  were normal and symmetric. Motor: The motor testing reveals 5 over 5 strength of all 4 extremities. Good symmetric motor tone is noted throughout.  Sensory: Sensory testing is intact to  soft touch on all 4 extremities. No evidence of extinction is noted.  Coordination: Cerebellar testing reveals good finger-nose-finger and heel-to-shin bilaterally.  Gait and station: Gait is normal.  Reflexes: Deep tendon reflexes are symmetric and normal  bilaterally.   DIAGNOSTIC DATA (LABS, IMAGING, TESTING) - I reviewed patient records, labs, notes, testing and imaging myself where available.  No flowsheet data found.   Lab Results  Component Value Date   WBC 4.8 11/20/2018   HGB 17.1 (H) 11/20/2018   HCT 48.9 (H) 11/20/2018   MCV 95.7 11/20/2018   PLT 171 11/20/2018      Component Value Date/Time   NA 142 11/20/2018 1155   K 3.3 (L) 11/20/2018 1155   CL 108 11/20/2018 1155   CO2 25 11/20/2018 1155   GLUCOSE 93 11/20/2018 1155   BUN 5 (L) 11/20/2018 1155   CREATININE 0.97 11/20/2018 1155   CREATININE 0.81 07/12/2014 1717   CALCIUM 9.4 11/20/2018 1155   PROT 6.6 11/20/2018 1155   ALBUMIN 4.1 11/20/2018 1155   AST 23 11/20/2018 1155   ALT 26 11/20/2018 1155   ALKPHOS 46 11/20/2018 1155   BILITOT 3.2 (H) 11/20/2018 1155   GFRNONAA >60 11/20/2018 1155   GFRNONAA >89 07/12/2014 1717   GFRAA >60 11/20/2018 1155   GFRAA >89 07/12/2014 1717   Lab Results  Component Value Date   CHOL 194 03/25/2016   HDL 44 03/25/2016   LDLCALC 130 (H) 03/25/2016   TRIG 100 03/25/2016   CHOLHDL 4.4 03/25/2016   Lab Results  Component Value Date   HGBA1C 4.9 03/25/2016   No results found for: [id number] Lab Results  Component Value Date   TSH 4.397 11/16/2014       ASSESSMENT AND PLAN 29 y.o. year old female  has a past medical history of Anemia (07/2014), Asthma, Cerebral vein thrombosis (06/12/2014), Cerebral venous sinus thrombosis, Chronic pelvic pain in female, Depression (2007), Headache(784.0), Hypercoagulable state, primary ([place]) (07/2014), Interstitial cystitis, Miscarriage (2005), Morbid obesity ([place]), Non-compliance, Numbness, Opioid dependence ([place]), Pneumatosis of  intestines (10/2014), Pneumonia (03/2014), Portal vein thrombosis (10/11/14), Preeclampsia (2012), Pulmonary embolism ([place]), Pyelonephritis affecting pregnancy in first trimester (July 2015), Seizures [facility]), and Stroke ([place]) (05/2014). here with     ICD-10-CM   1. Migraine with aura and without status migrainosus, not intractable  G43.109 Rimegepant Sulfate (NURTEC) 75 MG TBDP    Victoria Alvarez feels that headaches are pretty well managed at this time. She will work on remembering to take gabapentin three times daily, everyday, to see if this will reduce frequency and intensity of migraines. We will try to get Nurtec covered. She should not take triptan s/p stroke and Nsaids worsen GI complaints. She will stay well hydrated and work on a healthy well-balanced diet.  Regular exercise also advised.  Stress management with primary care.  She will follow-up with me in 1 year, sooner if needed.  She verbalizes understanding and agreement with this plan.   No orders of the defined types were placed in this encounter.    Meds ordered this encounter  Medications  . Rimegepant Sulfate (NURTEC) 75 MG TBDP    Sig: Take 75 mg by mouth daily as needed (take for abortive therapy of migraine, no more than 1 tablet in 24 hours or 10 per month).    Dispense:  10 tablet    Refill:  11    Order Specific Question:   Supervising Provider    Answer:   Melvenia Beam [id number]  . gabapentin (NEURONTIN) 300 MG capsule    Sig: Take 1 capsule (300 mg total) by mouth 3 (three) times daily.    Dispense:  90 capsule    Refill:  11    Order Specific Question:  Supervising Provider    Answer:   Melvenia Beam [[id number]]      I spent 20 minutes with the patient. 50% of this time was spent counseling and educating patient on plan of care and medications.    Debbora Presto, FNP-C 05/04/2019, 3:03 PM [facility] [address], [place], [place] [address] [phone]  Made any corrections needed, and  agree with history, physical, neuro exam,assessment and plan as stated.     Sarina Ill, MD [facility]

## 2019-05-04 NOTE — Patient Instructions (Signed)
We will continue gabapentin 336m three times daily  We will try to get Nurtec covered, 1 tablet as needed for migraine, no more than 10 per month   Stay well hydrated, eat healthy diet, exercise as tolerated   Follow up in 1 year, sooner if needed   Migraine Headache A migraine headache is a very strong throbbing pain on one side or both sides of your head. This type of headache can also cause other symptoms. It can last from 4 hours to 3 days. Talk with your doctor about what things may bring on (trigger) this condition. What are the causes? The exact cause of this condition is not known. This condition may be triggered or caused by:  Drinking alcohol.  Smoking.  Taking medicines, such as: ? Medicine used to treat chest pain (nitroglycerin). ? Birth control pills. ? Estrogen. ? Some blood pressure medicines.  Eating or drinking certain products.  Doing physical activity. Other things that may trigger a migraine headache include:  Having a menstrual period.  Pregnancy.  Hunger.  Stress.  Not getting enough sleep or getting too much sleep.  Weather changes.  Tiredness (fatigue). What increases the risk?  Being [age]years old.  Being female.  Having a family history of migraine headaches.  Being Caucasian.  Having depression or anxiety.  Being very overweight. What are the signs or symptoms?  A throbbing pain. This pain may: ? Happen in any area of the head, such as on one side or both sides. ? Make it hard to do daily activities. ? Get worse with physical activity. ? Get worse around bright lights or loud noises.  Other symptoms may include: ? Feeling sick to your stomach (nauseous). ? Vomiting. ? Dizziness. ? Being sensitive to bright lights, loud noises, or smells.  Before you get a migraine headache, you may get warning signs (an aura). An aura may include: ? Seeing flashing lights or having blind spots. ? Seeing bright spots, halos, or  zigzag lines. ? Having tunnel vision or blurred vision. ? Having numbness or a tingling feeling. ? Having trouble talking. ? Having weak muscles.  Some people have symptoms after a migraine headache (postdromal phase), such as: ? Tiredness. ? Trouble thinking (concentrating). How is this treated?  Taking medicines that: ? Relieve pain. ? Relieve the feeling of being sick to your stomach. ? Prevent migraine headaches.  Treatment may also include: ? Having acupuncture. ? Avoiding foods that bring on migraine headaches. ? Learning ways to control your body functions (biofeedback). ? Therapy to help you know and deal with negative thoughts (cognitive behavioral therapy). Follow these instructions at home: Medicines  Take over-the-counter and prescription medicines only as told by your doctor.  Ask your doctor if the medicine prescribed to you: ? Requires you to avoid driving or using heavy machinery. ? Can cause trouble pooping (constipation). You may need to take these steps to prevent or treat trouble pooping:  Drink enough fluid to keep your pee (urine) pale yellow.  Take over-the-counter or prescription medicines.  Eat foods that are high in fiber. These include beans, whole grains, and fresh fruits and vegetables.  Limit foods that are high in fat and sugar. These include fried or sweet foods. Lifestyle  Do not drink alcohol.  Do not use any products that contain nicotine or tobacco, such as cigarettes, e-cigarettes, and chewing tobacco. If you need help quitting, ask your doctor.  Get at least 8 hours of sleep every night.  Limit  and deal with stress. General instructions      Keep a journal to find out what may bring on your migraine headaches. For example, write down: ? What you eat and drink. ? How much sleep you get. ? Any change in what you eat or drink. ? Any change in your medicines.  If you have a migraine headache: ? Avoid things that make your  symptoms worse, such as bright lights. ? It may help to lie down in a dark, quiet room. ? Do not drive or use heavy machinery. ? Ask your doctor what activities are safe for you.  Keep all follow-up visits as told by your doctor. This is important. Contact a doctor if:  You get a migraine headache that is different or worse than others you have had.  You have more than 15 headache days in one month. Get help right away if:  Your migraine headache gets very bad.  Your migraine headache lasts longer than 72 hours.  You have a fever.  You have a stiff neck.  You have trouble seeing.  Your muscles feel weak or like you cannot control them.  You start to lose your balance a lot.  You start to have trouble walking.  You pass out (faint).  You have a seizure. Summary  A migraine headache is a very strong throbbing pain on one side or both sides of your head. These headaches can also cause other symptoms.  This condition may be treated with medicines and changes to your lifestyle.  Keep a journal to find out what may bring on your migraine headaches.  Contact a doctor if you get a migraine headache that is different or worse than others you have had.  Contact your doctor if you have more than 15 headache days in a month. This information is not intended to replace advice given to you by your health care provider. Make sure you discuss any questions you have with your health care provider. Document Revised: 06/19/2018 Document Reviewed: 04/09/2018 Elsevier Patient Education  [place].

## 2019-05-05 ENCOUNTER — Encounter: Payer: Self-pay | Admitting: Family Medicine

## 2019-05-18 ENCOUNTER — Telehealth: Payer: Self-pay | Admitting: Family Medicine

## 2019-05-18 NOTE — Telephone Encounter (Signed)
Pt called to discuss mychart message. Please advise.

## 2019-05-19 NOTE — Telephone Encounter (Signed)
Pt called again and is needing to speak to RN. Please advise.

## 2019-05-19 NOTE — Telephone Encounter (Signed)
Spoke with the patient and we can now proceed with the Nurtec PA. No other questions or concerns at this time.

## 2019-05-27 NOTE — Telephone Encounter (Signed)
It does not appear that this PA was completed. I called [facility] and completed PA via phone. PA review # [id number]. Review can take 24 hours for determination.

## 2019-05-28 ENCOUNTER — Ambulatory Visit: Admit: 2019-05-28 | Discharge: 2019-05-29 | Payer: MEDICAID

## 2019-05-31 NOTE — Telephone Encounter (Signed)
I called pt and discussed this with her. She will discuss fioricet with her pain management team. Pt will let us know of any questions or concerns.

## 2019-05-31 NOTE — Telephone Encounter (Signed)
Victoria Alvarez would be the other option but I think we ran into the same problem. My only other thought is to have her ask pain management if they are ok with her taking Fioricet. We do not write that medication very often and I would prefer that her pain management folks be the ones to consider this as she is in a pain management contract on chronic opioids. Tylenol is the only other safe option.

## 2019-05-31 NOTE — Telephone Encounter (Signed)
Nurtec was denied coverage by Medicaid. No reason noted.

## 2019-06-14 ENCOUNTER — Ambulatory Visit: Payer: Medicaid Other | Admitting: Family Medicine

## 2019-06-23 ENCOUNTER — Telehealth: Admit: 2019-06-23 | Discharge: 2019-06-24 | Payer: MEDICAID | Attending: Hematology | Primary: Hematology

## 2019-06-23 MED ORDER — ENOXAPARIN 80 MG/0.8 ML SUBCUTANEOUS SYRINGE
Freq: Two times a day (BID) | SUBCUTANEOUS | 11 refills | 30.00000 days | Status: CP
Start: 2019-06-23 — End: ?

## 2019-07-12 ENCOUNTER — Ambulatory Visit: Admit: 2019-07-12 | Discharge: 2019-07-13 | Payer: MEDICAID

## 2019-07-16 DIAGNOSIS — K50919 Crohn's disease, unspecified, with unspecified complications: Principal | ICD-10-CM

## 2019-07-16 MED ORDER — ONDANSETRON 4 MG DISINTEGRATING TABLET
ORAL_TABLET | Freq: Three times a day (TID) | ORAL | 1 refills | 15 days | Status: CP | PRN
Start: 2019-07-16 — End: ?

## 2019-08-23 MED ORDER — ONDANSETRON 4 MG DISINTEGRATING TABLET
ORAL_TABLET | 1 refills | 0 days
Start: 2019-08-23 — End: ?

## 2019-09-01 ENCOUNTER — Ambulatory Visit: Admit: 2019-09-01 | Discharge: 2019-09-02 | Payer: MEDICAID

## 2019-09-16 MED ORDER — ONDANSETRON 4 MG DISINTEGRATING TABLET
ORAL_TABLET | 1 refills | 0.00000 days | Status: CP
Start: 2019-09-16 — End: ?

## 2019-09-23 DIAGNOSIS — G8929 Other chronic pain: Principal | ICD-10-CM

## 2019-09-23 DIAGNOSIS — F1110 Opioid abuse, uncomplicated: Principal | ICD-10-CM

## 2019-09-23 DIAGNOSIS — R52 Pain, unspecified: Principal | ICD-10-CM

## 2019-09-23 DIAGNOSIS — R109 Unspecified abdominal pain: Principal | ICD-10-CM

## 2019-10-20 ENCOUNTER — Institutional Professional Consult (permissible substitution): Admit: 2019-10-20 | Discharge: 2019-10-21

## 2019-10-21 ENCOUNTER — Ambulatory Visit: Admit: 2019-10-21 | Payer: MEDICAID

## 2019-10-25 MED ORDER — BISACODYL 5 MG TABLET,DELAYED RELEASE
ORAL_TABLET | Freq: Once | ORAL | 0 refills | 1.00000 days | Status: CP
Start: 2019-10-25 — End: 2019-10-26
  Filled 2019-10-26: qty 2, 1d supply, fill #0
  Filled 2019-10-26: qty 238, 1d supply, fill #0
  Filled 2019-10-26: qty 119, 1d supply, fill #0

## 2019-10-25 MED ORDER — POLYETHYLENE GLYCOL 3350 17 GRAM/DOSE ORAL POWDER: g | 0 refills | 0 days | Status: AC

## 2019-10-25 MED ORDER — SIMETHICONE 125 MG CHEWABLE TABLET
ORAL_TABLET | ORAL | 0 refills | 0 days | Status: CP
Start: 2019-10-25 — End: ?
  Filled 2019-10-26: qty 4, 1d supply, fill #0

## 2019-10-25 MED ORDER — POLYETHYLENE GLYCOL 3350 17 GRAM/DOSE ORAL POWDER
ORAL | 0 refills | 0.00000 days | Status: CP
Start: 2019-10-25 — End: ?

## 2019-10-26 MED FILL — CLEARLAX 17 GRAM/DOSE ORAL POWDER: 1 days supply | Qty: 119 | Fill #0 | Status: AC

## 2019-10-26 MED FILL — SIMETHICONE 125 MG CHEWABLE TABLET: 1 days supply | Qty: 4 | Fill #0 | Status: AC

## 2019-10-26 MED FILL — CLEARLAX 17 GRAM/DOSE ORAL POWDER: 1 days supply | Qty: 238 | Fill #0 | Status: AC

## 2019-10-26 MED FILL — BISACODYL 5 MG TABLET,DELAYED RELEASE: 1 days supply | Qty: 2 | Fill #0 | Status: AC

## 2019-10-29 ENCOUNTER — Ambulatory Visit: Admit: 2019-10-29 | Discharge: 2019-10-30 | Payer: MEDICAID

## 2019-11-04 MED ORDER — ONDANSETRON 4 MG DISINTEGRATING TABLET
ORAL_TABLET | 0 refills | 0 days | Status: CP
Start: 2019-11-04 — End: ?

## 2019-11-21 DIAGNOSIS — K50018 Crohn's disease of small intestine with other complication: Principal | ICD-10-CM

## 2019-12-28 DIAGNOSIS — K50018 Crohn's disease of small intestine with other complication: Principal | ICD-10-CM

## 2020-01-23 DIAGNOSIS — K50018 Crohn's disease of small intestine with other complication: Principal | ICD-10-CM

## 2020-01-31 ENCOUNTER — Ambulatory Visit: Admit: 2020-01-31 | Discharge: 2020-02-01 | Payer: MEDICAID

## 2020-01-31 DIAGNOSIS — K50018 Crohn's disease of small intestine with other complication: Principal | ICD-10-CM

## 2020-01-31 DIAGNOSIS — Z5181 Encounter for therapeutic drug level monitoring: Principal | ICD-10-CM

## 2020-02-22 DIAGNOSIS — K50018 Crohn's disease of small intestine with other complication: Principal | ICD-10-CM

## 2020-03-12 ENCOUNTER — Emergency Department (HOSPITAL_COMMUNITY)
Admission: EM | Admit: 2020-03-12 | Discharge: 2020-03-13 | Disposition: A | Discharge status: Home / Self Care | Payer: Medicaid Other | Attending: Emergency Medicine | Admitting: Emergency Medicine

## 2020-03-12 ENCOUNTER — Other Ambulatory Visit: Payer: Self-pay

## 2020-03-12 ENCOUNTER — Emergency Department (HOSPITAL_COMMUNITY): Payer: Medicaid Other

## 2020-03-12 DIAGNOSIS — U071 COVID-19: Secondary | ICD-10-CM | POA: Insufficient documentation

## 2020-03-12 DIAGNOSIS — R519 Headache, unspecified: Secondary | ICD-10-CM | POA: Diagnosis present

## 2020-03-12 DIAGNOSIS — Z5321 Procedure and treatment not carried out due to patient leaving prior to being seen by health care provider: Secondary | ICD-10-CM | POA: Insufficient documentation

## 2020-03-12 LAB — CBC
HCT: 41.2 % (ref 36.0–46.0)
Hemoglobin: 14.8 g/dL (ref 12.0–15.0)
MCH: 33.9 pg (ref 26.0–34.0)
MCHC: 35.9 g/dL (ref 30.0–36.0)
MCV: 94.3 fL (ref 80.0–100.0)
Platelets: 186 10*3/uL (ref 150–400)
RBC: 4.37 MIL/uL (ref 3.87–5.11)
RDW: 11.7 % (ref 11.5–15.5)
WBC: 6.5 10*3/uL (ref 4.0–10.5)
nRBC: 0 % (ref 0.0–0.2)

## 2020-03-12 LAB — COMPREHENSIVE METABOLIC PANEL
ALT: 15 U/L (ref 0–44)
AST: 22 U/L (ref 15–41)
Albumin: 3.4 g/dL — ABNORMAL LOW (ref 3.5–5.0)
Alkaline Phosphatase: 65 U/L (ref 38–126)
Anion gap: 10 (ref 5–15)
BUN: 7 mg/dL (ref 6–20)
CO2: 26 mmol/L (ref 22–32)
Calcium: 9 mg/dL (ref 8.9–10.3)
Chloride: 103 mmol/L (ref 98–111)
Creatinine, Ser: 0.9 mg/dL (ref 0.44–1.00)
GFR, Estimated: >60 mL/min (ref >60)
Glucose, Bld: 100 mg/dL — ABNORMAL HIGH (ref 70–99)
Potassium: 3.7 mmol/L (ref 3.5–5.1)
Sodium: 139 mmol/L (ref 135–145)
Total Bilirubin: 1.1 mg/dL (ref 0.3–1.2)
Total Protein: 6.2 g/dL — ABNORMAL LOW (ref 6.5–8.1)

## 2020-03-12 LAB — DIFFERENTIAL
Abs Immature Granulocytes: 0.01 10*3/uL (ref 0.00–0.07)
Basophils Absolute: 0 10*3/uL (ref 0.0–0.1)
Basophils Relative: 0 %
Eosinophils Absolute: 0.1 10*3/uL (ref 0.0–0.5)
Eosinophils Relative: 1 %
Immature Granulocytes: 0 %
Lymphocytes Relative: 29 %
Lymphs Abs: 1.9 10*3/uL (ref 0.7–4.0)
Monocytes Absolute: 0.3 10*3/uL (ref 0.1–1.0)
Monocytes Relative: 5 %
Neutro Abs: 4.2 10*3/uL (ref 1.7–7.7)
Neutrophils Relative %: 65 %

## 2020-03-12 LAB — I-STAT CHEM 8, ED
BUN: 8 mg/dL (ref 6–20)
Calcium, Ion: 1.2 mmol/L (ref 1.15–1.40)
Chloride: 102 mmol/L (ref 98–111)
Creatinine, Ser: 0.9 mg/dL (ref 0.44–1.00)
Glucose, Bld: 94 mg/dL (ref 70–99)
HCT: 43 % (ref 36.0–46.0)
Hemoglobin: 14.6 g/dL (ref 12.0–15.0)
Potassium: 3.7 mmol/L (ref 3.5–5.1)
Sodium: 140 mmol/L (ref 135–145)
TCO2: 27 mmol/L (ref 22–32)

## 2020-03-12 LAB — RESP PANEL BY RT-PCR (FLU A&B, COVID) ARPGX2
Influenza A by PCR: NEGATIVE
Influenza B by PCR: NEGATIVE
SARS Coronavirus 2 by RT PCR: POSITIVE — AB

## 2020-03-12 LAB — APTT: aPTT: 39 seconds — ABNORMAL HIGH (ref 24–36)

## 2020-03-12 LAB — PROTIME-INR
INR: 1 (ref 0.8–1.2)
Prothrombin Time: 13 seconds (ref 11.4–15.2)

## 2020-03-12 LAB — I-STAT BETA HCG BLOOD, ED (MC, WL, AP ONLY): I-stat hCG, quantitative: <5 m[IU]/mL (ref <5)

## 2020-03-12 MED ORDER — SODIUM CHLORIDE 0.9% FLUSH: 3.0000 mL | Freq: Once | INTRAVENOUS | Status: DC

## 2020-03-12 MED ORDER — IOHEXOL 350 MG/ML SOLN
75.0000 mL | Freq: Once | INTRAVENOUS | Status: AC | PRN
  Administered 2020-03-12: 75 mL via INTRAVENOUS

## 2020-03-12 NOTE — ED Notes (Signed)
Called pt multiple times for vials no answer

## 2020-03-12 NOTE — ED Triage Notes (Signed)
Emergency Medicine Provider Triage Evaluation Note  Victoria Alvarez , a 30 y.o. female  was evaluated in triage.  Pt complains of headache.    Triage note: Pt presents to ED POV. Pt c/o HA on L side, dizziness, SOB. Pt reports that she has hx of stroke and had similar headache. Reports LKW 1h ago. Pt states that she has over felt bad x3d.[name]   Feeling generally unwell over the past 3 days, some ear drainage over the last 3 days.  Then this evening developed a headache and dizziness.   Review of Systems  Positive: Headache, dizziness Negative: Numbness, weakness, vision changes, speech changes  Physical Exam  BP 126/75 (BP Location: Left Arm)   Pulse (!) 57   Temp 98.3 F (36.8 C) (Oral)   Resp 20   SpO2 94%  Gen:   Awake, no distress   HEENT:  Atraumatic  Resp:  Normal effort  Cardiac:  Normal rate  Abd:   Nondistended, nontender  MSK:   Moves extremities without difficulty  Neuro:  Speech clear, cranial nerves II through XII intact, normal strength in upper and lower extremities, normal sensation in upper and lower extremities  Medical Decision Making  Medically screening exam initiated at 8:45 PM.  Appropriate orders placed.  Victoria Alvarez was informed that the remainder of the evaluation will be completed by another provider, this initial triage assessment does not replace that evaluation, and the importance of remaining in the ED until their evaluation is complete.  Clinical Impression   30 year old with history of sinus thrombosis on Lovenox presenting to ER with concern for headache and dizziness and ear drainage.  Patient currently does not have any neurologic deficits.  She would not be a TPA candidate.  Presentation not consistent with large vessel occlusion.  Do not see indication for stroke alert at this time.  Given her medical history, will obtain CT head as well as CT venogram.  Patient will need room for further evaluation and work-up.  Communicated with triage staff.     [name], MD 03/12/20 2049

## 2020-03-12 NOTE — ED Notes (Signed)
Pt brought to bridge to be seen by Dr. Roslynn Amble - cleared to come back to lobby

## 2020-03-12 NOTE — ED Triage Notes (Signed)
Pt presents to ED POV. Pt c/o HA on L side, dizziness, SOB. Pt reports that she has hx of stroke and had similar headache. Reports LKW 1h ago. Pt states that she has over felt bad x3d.

## 2020-03-15 ENCOUNTER — Telehealth: Payer: Self-pay | Admitting: Family Medicine

## 2020-03-15 MED ORDER — GABAPENTIN 300 MG PO CAPS: 300.0000 mg | ORAL_CAPSULE | Freq: Three times a day (TID) | ORAL | 2 refills | Status: DC

## 2020-03-15 NOTE — Addendum Note (Signed)
Addended by: Oliver Hum S on: 03/15/2020 11:25 AM   Modules accepted: Orders

## 2020-03-15 NOTE — Telephone Encounter (Signed)
Pt. states she will run out of gabapentin (NEURONTIN) 300 MG capsule before next appt. & is requesting a refill.

## 2020-03-31 ENCOUNTER — Ambulatory Visit: Admit: 2020-03-31 | Discharge: 2020-04-01 | Payer: MEDICAID

## 2020-03-31 DIAGNOSIS — K50018 Crohn's disease of small intestine with other complication: Principal | ICD-10-CM

## 2020-03-31 DIAGNOSIS — Z79899 Other long term (current) drug therapy: Principal | ICD-10-CM

## 2020-04-03 DIAGNOSIS — K50018 Crohn's disease of small intestine with other complication: Principal | ICD-10-CM

## 2020-04-04 DIAGNOSIS — R7989 Other specified abnormal findings of blood chemistry: Principal | ICD-10-CM

## 2020-04-11 DIAGNOSIS — K50018 Crohn's disease of small intestine with other complication: Principal | ICD-10-CM

## 2020-04-24 DIAGNOSIS — K50018 Crohn's disease of small intestine with other complication: Principal | ICD-10-CM

## 2020-05-03 ENCOUNTER — Ambulatory Visit: Payer: Medicaid Other | Admitting: Family Medicine

## 2020-05-04 ENCOUNTER — Encounter: Payer: Self-pay | Admitting: Family Medicine

## 2020-05-04 ENCOUNTER — Ambulatory Visit: Payer: Medicaid Other | Admitting: Family Medicine

## 2020-05-04 ENCOUNTER — Other Ambulatory Visit: Payer: Self-pay

## 2020-05-04 ENCOUNTER — Telehealth: Payer: Self-pay | Admitting: Family Medicine

## 2020-05-04 ENCOUNTER — Telehealth: Payer: Self-pay | Admitting: *Deleted

## 2020-05-04 VITALS — BP 129/87 | HR 65 | Ht 62.0 in | Wt 186.0 lb

## 2020-05-04 DIAGNOSIS — G43109 Migraine with aura, not intractable, without status migrainosus: Secondary | ICD-10-CM | POA: Diagnosis not present

## 2020-05-04 MED ORDER — PROMETHAZINE HCL 12.5 MG PO TABS: 12.5000 mg | ORAL_TABLET | Freq: Four times a day (QID) | ORAL | 0 refills | Status: DC | PRN

## 2020-05-04 MED ORDER — NURTEC 75 MG PO TBDP: 75.0000 mg | ORAL_TABLET | Freq: Every day | ORAL | 11 refills | Status: DC | PRN

## 2020-05-04 NOTE — Telephone Encounter (Signed)
PA Initiated [place] [facility] PORTAL Nurtec 70m #8/30 days.  APPROVED for one year from 05-04-2020.  PA # [id number]  Fax confirmation to [facility] confirmed.

## 2020-05-04 NOTE — Progress Notes (Addendum)
PATIENT: Victoria Alvarez DOB: 02/06/1991  REASON FOR VISIT: follow up HISTORY FROM: patient  Chief Complaint  Patient presents with   Follow-up    Pt 1 alone Pt thinks migraines increased a little, having about 7 a month     HISTORY OF PRESENT ILLNESS: 05/04/20 ALL:  She returns for follow up on migraines. She continues gabapentin 366m TID. Insurance previously denied coverage of Nurtec and Ubrelvy. She continues Remicaid infusions for Crohn's Disease. She is followed closely by GI. She remains on chronic opioids and followed closely by pain management, now seeing [facility].    Prevention medicaitons: Gabapentin, duloxetine, fluoxetine, levetiracetam,  Abortive meds: sumatriptan, rizatriptan, eletriptan (triptans contraindicated due to CVA history), naproxen, methocarbamol, butalbital, Nsaids (contraindicated due to Crohn's).    05/04/2019 ALL:  Victoria Alvarez a 30y.o. female here today for follow up for migraines. She continues gabapentin 3057mthree times daily. She admits that she sometimes forgets to take lunch dose. She reports having about 5 migraines per month. She states that headaches are always migrainous. We tried to get UbRoselyn Meierovered by her insurance before COVID but she reports that she did not hear [facility] told her it was not covered. She is seen monthly by pain management for Crohn's Disease treated with Remicaid infusions every 6 weeks and oxycodone 1597mvery 4 hours as needed. He daughter has recently gone back to school which has relieved some stress.    HISTORY: (copied from my note on 05/19/2018)  Interval history 05/19/2018: She reports that gabapentin has cut down on her headaches significantly. Headache days have reduced to about 1 per week. She has had  1-2 migraines per month. She is not currently using abortive therapy. She will rarely take 400m35muprofen for a really bad headache. Tylenol hurts her  stomach. She is not a good candidate for triptan therapy due to history of stroke. She has more stress with her daughter who is being worked up for possible seizures. She is caring for her 3 ye34r old. She is not currently working.   MRI/MRV show chronic findings but otherwise unremarkable.   She was recently started on Humira for Crohn's Disease.    HPI:  [name] 27 y42. female here as a referral from Dr. Care for Migraines.  Past medical history hypercoagulable disorder, depression, chronic pain, cerebral venous sinus thrombosis , obesity, portal vein thrombosis, pulmonary embolism. with infarction. symptoms come on slowly, she has a history of headaches. Her migraines are on the right side of the head, she can't function, worsening in the setting of stress and children. Startes behind the right eye and spreads, more in the front, pounding and pulsating and throbbing, +phono/photophobia, triggers are light and sound, no significant nausea with the headaches but she gets dizzy. She sees black spots in her vision when she has the headaches and when they are very severe. She is compliant on her Xarelto. She is having the headaches 3x a week, can be severe, sleep helps, on average 6 hours. No FHx migraines. Phenergan helps. Ibuprofen doesn;t help, tylenol doesn;t help. She has blurry vision and dizziness and had a loss of consciousness due to severe headache.  She denies weakness. No weakness. No other focal neurologic deficits, associated symptoms, inciting events or modifiable factors.  Reviewed notes, labs and imaging from outside physicians, which showed:   Reviewed referring physician notes the patient is seen for chronic pain syndrome and  she is in the maintenance phase she is treated on morphine daily and notes improvement in physical functioning.  She has been completely compliant.  Concerning headache, location is primarily temporal and behind the eyes, she has had prior headaches  similar to this but the frequency is quite variable and duration is usually quite variable.  Severe aching and pounding, photophobia and phonophobia, headache is exacerbated with exposure to bright light and loud noise, improved with dim lights quite surroundings NSAIDs and narcotics.  Pertinent past medical history includes CVA.   REVIEW OF SYSTEMS: Out of a complete 14 system review of symptoms, the patient complains only of the following symptoms, headaches and all other reviewed systems are negative.  ALLERGIES: Allergies  Allergen Reactions   Bee Venom Swelling    Worse reaction if stung from the neck up Worse reaction if stung from the neck up    HOME MEDICATIONS: Outpatient Medications Prior to Visit  Medication Sig Dispense Refill   enoxaparin (LOVENOX) 80 MG/0.8ML injection Inject into the skin.     gabapentin (NEURONTIN) 300 MG capsule Take 1 capsule (300 mg total) by mouth 3 (three) times daily. 90 capsule 2   inFLIXimab (REMICADE IV) Inject into the vein.     ondansetron (ZOFRAN) 4 MG tablet Take 4 mg by mouth every 6 (six) hours as needed for nausea or vomiting.     oxyCODONE (ROXICODONE) 15 MG immediate release tablet Take 15 mg by mouth every 4 (four) hours as needed for pain.     UNABLE TO FIND Med Name: Emergen-c Gummies OTC     promethazine (PHENERGAN) 25 MG tablet Take 25 mg by mouth at bedtime as needed for nausea or vomiting.      Victoria Alvarez (NURTEC) 75 MG TBDP Take 75 mg by mouth daily as needed (take for abortive therapy of migraine, no more than 1 tablet in 24 hours or 10 per month). 10 tablet 11   No facility-administered medications prior to visit.    PAST MEDICAL HISTORY: Past Medical History:  Diagnosis Date   Anemia 07/2014   Microcytic   Asthma    Cerebral vein thrombosis 06/12/2014   Cerebral venous sinus thrombosis    Chronic pelvic pain in female    Depression 2007   Headache(784.0)    Hypercoagulable state, primary ([place])  07/2014   . no clear inherited or acquired cause for the patient's coagulopathy   Interstitial cystitis    pain from this was reason for starting opioids age 32.    Miscarriage 2005   Morbid obesity ([place])    BMI 37 in 11/2014.   Non-compliance    with anticoagulant   Numbness    right side   Opioid dependence ([place])    to Rx opioids.  switched to Methadone after second child born in 04/2014   Pneumatosis of intestines 10/2014   Pneumonia 03/2014   Portal vein thrombosis 10/11/14   Preeclampsia 2012   Pulmonary embolism ([place])    Pyelonephritis affecting pregnancy in first trimester July 2015   Seizures [facility])    Stroke ([place]) 05/2014   presented with right sided weakness.     PAST SURGICAL HISTORY: Past Surgical History:  Procedure Laterality Date   ABDOMINAL SURGERY     CESAREAN SECTION N/A 05/06/2014   Procedure: CESAREAN SECTION;  Surgeon: Truett Mainland, DO;  Location: [facility];  Service: Obstetrics;  Laterality: N/A;   COLONOSCOPY WITH PROPOFOL N/A 11/18/2014   Procedure: COLONOSCOPY WITH PROPOFOL;  Surgeon: Quillian Quince  Merrily Brittle, MD;  Location: [facility];  Service: Endoscopy;  Laterality: N/A;   INGUINAL HERNIA REPAIR Left 2008   VAGINA SURGERY  ~ 2011   benign vaginal tumor removed at [place]: Family History  Problem Relation Age of Onset   Diabetes Mother    Heart disease Mother    Anxiety disorder Mother    Depression Mother    Hypertension Father    CAD Father    Diabetes Maternal Grandmother    Hypertension Maternal Grandmother    Stroke Maternal Grandmother    Hypothyroidism Maternal Grandmother    Diabetes Maternal Grandfather    Hypertension Maternal Grandfather    Diabetes Paternal Grandmother    Diabetes Paternal Grandfather    Hypertension Paternal Grandfather     SOCIAL HISTORY: Social History   Socioeconomic History   Marital status: Single    Spouse name: Not on file   Number of children: 2    Years of education: 9   Highest education level: Not on file  Occupational History   Occupation: homemaker  Tobacco Use   Smoking status: Current Every Day Smoker    Packs/day: 0.25    Years: 8.00    Pack years: 2.00    Types: Cigarettes   Smokeless tobacco: Never Used   Tobacco comment: cutting back  Vaping Use   Vaping Use: Never used  Substance and Sexual Activity   Alcohol use: No   Drug use: No    Comment: Pt on Methadone "was on pain pills; weaned off while I was pregnant so it would be safer"   Sexual activity: Yes    Birth control/protection: None  Other Topics Concern   Not on file  Social History Narrative   Has significant other.  Children 2.  Occupation: Materials engineer.   Right handed   Caffeine use - none   Social Determinants of Health   Financial Resource Strain: Not on file  Food Insecurity: Not on file  Transportation Needs: Not on file  Physical Activity: Not on file  Stress: Not on file  Social Connections: Not on file  Intimate Partner Violence: Not on file      PHYSICAL EXAM  Vitals:   05/04/20 1039  BP: 129/87  Pulse: 65  Weight: 186 lb (84.4 kg)  Height: 5' 2"  (1.575 m)   Body mass index is 34.02 kg/m.  Generalized: Well developed, in no acute distress  Cardiology: normal rate and rhythm, no murmur noted Respiratory: Clear to auscultation bilaterally  Neurological examination  Mentation: Alert oriented to time, place, history taking. Follows all commands speech and language fluent Cranial nerve II-XII: Pupils were equal round reactive to light. Extraocular movements were full, visual field were full on confrontational test. Facial sensation and strength were normal. Uvula tongue midline. Head turning and shoulder shrug  were normal and symmetric. Motor: The motor testing reveals 5 over 5 strength of all 4 extremities. Good symmetric motor tone is noted throughout.  Sensory: Sensory testing is intact to soft touch on all 4  extremities. No evidence of extinction is noted.  Coordination: Cerebellar testing reveals good finger-nose-finger and heel-to-shin bilaterally.  Gait and station: Gait is normal.  Reflexes: Deep tendon reflexes are symmetric and normal bilaterally.   DIAGNOSTIC DATA (LABS, IMAGING, TESTING) - I reviewed patient records, labs, notes, testing and imaging myself where available.  No flowsheet data found.   Lab Results  Component Value Date   WBC 6.5 03/12/2020  HGB 14.6 03/12/2020   HCT 43.0 03/12/2020   MCV 94.3 03/12/2020   PLT 186 03/12/2020      Component Value Date/Time   NA 140 03/12/2020 2113   K 3.7 03/12/2020 2113   CL 102 03/12/2020 2113   CO2 26 03/12/2020 2038   GLUCOSE 94 03/12/2020 2113   BUN 8 03/12/2020 2113   CREATININE 0.90 03/12/2020 2113   CREATININE 0.81 07/12/2014 1717   CALCIUM 9.0 03/12/2020 2038   PROT 6.2 (L) 03/12/2020 2038   ALBUMIN 3.4 (L) 03/12/2020 2038   AST 22 03/12/2020 2038   ALT 15 03/12/2020 2038   ALKPHOS 65 03/12/2020 2038   BILITOT 1.1 03/12/2020 2038   GFRNONAA >60 03/12/2020 2038   GFRNONAA >89 07/12/2014 1717   GFRAA >60 11/20/2018 1155   GFRAA >89 07/12/2014 1717   Lab Results  Component Value Date   CHOL 194 03/25/2016   HDL 44 03/25/2016   LDLCALC 130 (H) 03/25/2016   TRIG 100 03/25/2016   CHOLHDL 4.4 03/25/2016   Lab Results  Component Value Date   HGBA1C 4.9 03/25/2016   No results found for: GURKYHCW23 Lab Results  Component Value Date   TSH 4.397 11/16/2014       ASSESSMENT AND PLAN 30 y.o. year old female  has a past medical history of Anemia (07/2014), Asthma, Cerebral vein thrombosis (06/12/2014), Cerebral venous sinus thrombosis, Chronic pelvic pain in female, Depression (2007), Headache(784.0), Hypercoagulable state, primary ([place]) (07/2014), Interstitial cystitis, Miscarriage (2005), Morbid obesity ([place]), Non-compliance, Numbness, Opioid dependence ([place]), Pneumatosis of intestines (10/2014), Pneumonia  (03/2014), Portal vein thrombosis (10/11/14), Preeclampsia (2012), Pulmonary embolism ([place]), Pyelonephritis affecting pregnancy in first trimester (July 2015), Seizures [facility]), and Stroke ([place]) (05/2014). here with     ICD-10-CM   1. Migraine with aura and without status migrainosus, not intractable  G43.109 Victoria Alvarez (NURTEC) 75 MG TBDP     Srishti continues to have regular migraine days. She will continue gabapentin 328m TID. I am hesitant to increase dose as she is also on chronic opioids managed by pain provider. She is a single mom. She is limited on abortive therapy options due to medical history. We will try to get Nurtec covered. She should not take triptan s/p stroke and Nsaids worsen GI complaints. She will stay well hydrated and work on a healthy well-balanced diet.  Regular exercise also advised. Stress management with primary care.  She will follow-up with me in 1 year, sooner if needed.  She verbalizes understanding and agreement with this plan.   No orders of the defined types were placed in this encounter.    Meds ordered this encounter  Medications   promethazine (PHENERGAN) 12.5 MG tablet    Sig: Take 1 tablet (12.5 mg total) by mouth every 6 (six) hours as needed for nausea or vomiting.    Dispense:  20 tablet    Refill:  0    Order Specific Question:   Supervising Provider    Answer:   AMelvenia Beam[[[id number]  Victoria Alvarez (NURTEC) 75 MG TBDP    Sig: Take 75 mg by mouth daily as needed (take for abortive therapy of migraine, no more than 1 tablet in 24 hours or 10 per month).    Dispense:  8 tablet    Refill:  11    Order Specific Question:   Supervising Provider    Answer:   AMelvenia Beam[[id number]     I spent 20 minutes with the patient.  50% of this time was spent counseling and educating patient on plan of care and medications.    Debbora Presto, FNP-C 05/04/2020, 1:28 PM [facility] [address] [place], [place]  [address] [phone]  Made any corrections needed, and agree with history, physical, neuro exam,assessment and plan as stated.     Sarina Ill, MD [facility]  Made any corrections needed, and agree with history, physical, neuro exam,assessment and plan as stated.     Sarina Ill, MD [facility]

## 2020-05-04 NOTE — Patient Instructions (Signed)
Below is our plan:  We will continue gabapentin 300mg  three times daily. I will try to get Nurtec covered.   Please make sure you are staying well hydrated. I recommend 50-60 ounces daily. Well balanced diet and regular exercise encouraged. Consistent sleep schedule with 6-8 hours recommended.   Please continue follow up with care team as directed.   Follow up with me in 1 year, sooner if needed   You may receive a survey regarding today's visit. I encourage you to leave honest feed back as I do use this information to improve patient care. Thank you for seeing me today!      Migraine Headache A migraine headache is a very strong throbbing pain on one side or both sides of your head. This type of headache can also cause other symptoms. It can last from 4 hours to 3 days. Talk with your doctor about what things may bring on (trigger) this condition. What are the causes? The exact cause of this condition is not known. This condition may be triggered or caused by:  Drinking alcohol.  Smoking.  Taking medicines, such as: ? Medicine used to treat chest pain (nitroglycerin). ? Birth control pills. ? Estrogen. ? Some blood pressure medicines.  Eating or drinking certain products.  Doing physical activity. Other things that may trigger a migraine headache include:  Having a menstrual period.  Pregnancy.  Hunger.  Stress.  Not getting enough sleep or getting too much sleep.  Weather changes.  Tiredness (fatigue). What increases the risk?  Being 66-56 years old.  Being female.  Having a family history of migraine headaches.  Being Caucasian.  Having depression or anxiety.  Being very overweight. What are the signs or symptoms?  A throbbing pain. This pain may: ? Happen in any area of the head, such as on one side or both sides. ? Make it hard to do daily activities. ? Get worse with physical activity. ? Get worse around bright lights or loud noises.  Other  symptoms may include: ? Feeling sick to your stomach (nauseous). ? Vomiting. ? Dizziness. ? Being sensitive to bright lights, loud noises, or smells.  Before you get a migraine headache, you may get warning signs (an aura). An aura may include: ? Seeing flashing lights or having blind spots. ? Seeing bright spots, halos, or zigzag lines. ? Having tunnel vision or blurred vision. ? Having numbness or a tingling feeling. ? Having trouble talking. ? Having weak muscles.  Some people have symptoms after a migraine headache (postdromal phase), such as: ? Tiredness. ? Trouble thinking (concentrating). How is this treated?  Taking medicines that: ? Relieve pain. ? Relieve the feeling of being sick to your stomach. ? Prevent migraine headaches.  Treatment may also include: ? Having acupuncture. ? Avoiding foods that bring on migraine headaches. ? Learning ways to control your body functions (biofeedback). ? Therapy to help you know and deal with negative thoughts (cognitive behavioral therapy). Follow these instructions at home: Medicines  Take over-the-counter and prescription medicines only as told by your doctor.  Ask your doctor if the medicine prescribed to you: ? Requires you to avoid driving or using heavy machinery. ? Can cause trouble pooping (constipation). You may need to take these steps to prevent or treat trouble pooping:  Drink enough fluid to keep your pee (urine) pale yellow.  Take over-the-counter or prescription medicines.  Eat foods that are high in fiber. These include beans, whole grains, and fresh fruits and vegetables.  Limit foods that are high in fat and sugar. These include fried or sweet foods. Lifestyle  Do not drink alcohol.  Do not use any products that contain nicotine or tobacco, such as cigarettes, e-cigarettes, and chewing tobacco. If you need help quitting, ask your doctor.  Get at least 8 hours of sleep every night.  Limit and deal  with stress. General instructions  Keep a journal to find out what may bring on your migraine headaches. For example, write down: ? What you eat and drink. ? How much sleep you get. ? Any change in what you eat or drink. ? Any change in your medicines.  If you have a migraine headache: ? Avoid things that make your symptoms worse, such as bright lights. ? It may help to lie down in a dark, quiet room. ? Do not drive or use heavy machinery. ? Ask your doctor what activities are safe for you.  Keep all follow-up visits as told by your doctor. This is important.      Contact a doctor if:  You get a migraine headache that is different or worse than others you have had.  You have more than 15 headache days in one month. Get help right away if:  Your migraine headache gets very bad.  Your migraine headache lasts longer than 72 hours.  You have a fever.  You have a stiff neck.  You have trouble seeing.  Your muscles feel weak or like you cannot control them.  You start to lose your balance a lot.  You start to have trouble walking.  You pass out (faint).  You have a seizure. Summary  A migraine headache is a very strong throbbing pain on one side or both sides of your head. These headaches can also cause other symptoms.  This condition may be treated with medicines and changes to your lifestyle.  Keep a journal to find out what may bring on your migraine headaches.  Contact a doctor if you get a migraine headache that is different or worse than others you have had.  Contact your doctor if you have more than 15 headache days in a month. This information is not intended to replace advice given to you by your health care provider. Make sure you discuss any questions you have with your health care provider. Document Revised: 06/19/2018 Document Reviewed: 04/09/2018 Elsevier Patient Education  2021 [employer].

## 2020-05-04 NOTE — Telephone Encounter (Signed)
Pt will call us back to make her yearly f/u.

## 2020-05-16 DIAGNOSIS — K50018 Crohn's disease of small intestine with other complication: Principal | ICD-10-CM

## 2020-06-06 ENCOUNTER — Telehealth: Payer: Self-pay | Admitting: Family Medicine

## 2020-06-06 MED ORDER — GABAPENTIN 300 MG PO CAPS
300.0000 mg | ORAL_CAPSULE | Freq: Three times a day (TID) | ORAL | 4 refills | Status: DC
Start: 2020-06-06 — End: 2020-10-26

## 2020-06-06 NOTE — Telephone Encounter (Signed)
Pt request refill gabapentin (NEURONTIN) 300 MG capsule at [facility]

## 2020-06-06 NOTE — Telephone Encounter (Signed)
Renewed gabapentn 36m po tid #90 refill 4.

## 2020-06-13 ENCOUNTER — Other Ambulatory Visit (HOSPITAL_COMMUNITY): Payer: Self-pay | Admitting: *Deleted

## 2020-06-14 ENCOUNTER — Ambulatory Visit (HOSPITAL_COMMUNITY)
Admission: RE | Admit: 2020-06-14 | Discharge: 2020-06-14 | Disposition: A | Discharge status: Home / Self Care | Payer: Medicaid Other | Source: Ambulatory Visit | Attending: Geriatric Medicine | Admitting: Geriatric Medicine

## 2020-06-14 ENCOUNTER — Other Ambulatory Visit: Payer: Self-pay

## 2020-06-14 DIAGNOSIS — K5090 Crohn's disease, unspecified, without complications: Secondary | ICD-10-CM | POA: Insufficient documentation

## 2020-06-14 MED ORDER — DIPHENHYDRAMINE HCL 50 MG/ML IJ SOLN: 50.0000 mg | INTRAMUSCULAR | Status: DC

## 2020-06-14 MED ORDER — DIPHENHYDRAMINE HCL 50 MG/ML IJ SOLN
INTRAMUSCULAR | Status: AC
  Administered 2020-06-14: 50 mg via INTRAVENOUS
  Filled 2020-06-14: qty 1

## 2020-06-14 MED ORDER — ONDANSETRON HCL 4 MG/2ML IJ SOLN: 4.0000 mg | INTRAMUSCULAR | Status: AC

## 2020-06-14 MED ORDER — SODIUM CHLORIDE 0.9 % IV SOLN
400.0000 mg | INTRAVENOUS | Status: DC
  Administered 2020-06-14: 400 mg via INTRAVENOUS
  Filled 2020-06-14: qty 40

## 2020-06-14 MED ORDER — ONDANSETRON HCL 4 MG/2ML IJ SOLN
INTRAMUSCULAR | Status: AC
  Administered 2020-06-14: 4 mg via INTRAVENOUS
  Filled 2020-06-14: qty 2

## 2020-07-26 ENCOUNTER — Inpatient Hospital Stay (HOSPITAL_COMMUNITY): Admission: RE | Admit: 2020-07-26 | Payer: Medicaid Other | Source: Ambulatory Visit

## 2020-09-06 ENCOUNTER — Ambulatory Visit (HOSPITAL_COMMUNITY): Payer: Medicaid Other

## 2020-10-26 ENCOUNTER — Telehealth: Payer: Self-pay | Admitting: Family Medicine

## 2020-10-26 DIAGNOSIS — G43109 Migraine with aura, not intractable, without status migrainosus: Secondary | ICD-10-CM

## 2020-10-26 MED ORDER — NURTEC 75 MG PO TBDP
75.0000 mg | ORAL_TABLET | Freq: Every day | ORAL | 5 refills | Status: DC | PRN
Start: 2020-10-26 — End: 2021-03-15

## 2020-10-26 MED ORDER — GABAPENTIN 300 MG PO CAPS: 300.0000 mg | ORAL_CAPSULE | Freq: Three times a day (TID) | ORAL | 5 refills | Status: DC

## 2020-10-26 NOTE — Telephone Encounter (Signed)
Pt called needing a refill for her gabapentin (NEURONTIN) 300 MG capsule and her Rimegepant Sulfate (NURTEC) 75 MG TBDP sent in to [facility]

## 2020-10-26 NOTE — Telephone Encounter (Signed)
Refill sent to pharmacy as requested per pt.

## 2021-01-02 ENCOUNTER — Emergency Department (HOSPITAL_COMMUNITY)
Admission: EM | Admit: 2021-01-02 | Discharge: 2021-01-02 | Discharge status: Against Medical Advice | Payer: Medicaid Other

## 2021-01-02 NOTE — ED Notes (Signed)
Pt came up to me asking if there is a separate place for pregnant people. I asked pt what did she mean are you pregnant pt state I don't know. I asked did you take a test she said yes and it was negative. Pt then said while crying "My feet are swollen and my stomach is bloated and something is just wrong I know it is my blood pressure is high I just don't know what to do." I informed pt I will take her vitals.

## 2021-01-02 NOTE — ED Notes (Signed)
Pt asked about wait for triage. I told her they are a little behind. Pt stated that she will call her OBGYN to see if she can get in there instead of waiting here.

## 2021-01-02 NOTE — ED Notes (Signed)
Pt just walked out of ER, I am not sure if she will be returning. Will keep an eye out for pt to see if she returns.

## 2021-01-02 NOTE — ED Notes (Addendum)
Do not see pt in lobby nor outside

## 2021-01-23 ENCOUNTER — Telehealth: Admit: 2021-01-23 | Discharge: 2021-01-24 | Payer: MEDICAID | Attending: Hematology | Primary: Hematology

## 2021-01-23 DIAGNOSIS — G08 Intracranial and intraspinal phlebitis and thrombophlebitis: Principal | ICD-10-CM

## 2021-01-23 DIAGNOSIS — I2699 Other pulmonary embolism without acute cor pulmonale: Principal | ICD-10-CM

## 2021-01-23 DIAGNOSIS — I81 Portal vein thrombosis: Principal | ICD-10-CM

## 2021-01-23 MED ORDER — ENOXAPARIN 80 MG/0.8 ML SUBCUTANEOUS SYRINGE
Freq: Two times a day (BID) | SUBCUTANEOUS | 11 refills | 30 days | Status: CP
Start: 2021-01-23 — End: ?

## 2021-02-19 ENCOUNTER — Ambulatory Visit: Payer: Medicaid Other | Admitting: Family Medicine

## 2021-02-19 NOTE — Patient Instructions (Incomplete)

## 2021-02-19 NOTE — Progress Notes (Deleted)
PATIENT: Victoria Alvarez DOB: [birth year]  REASON FOR VISIT: follow up HISTORY FROM: patient  No chief complaint on file.    HISTORY OF PRESENT ILLNESS: 02/19/21 ALL:  Victoria Alvarez returns for follow up for migraines. She continues gabapentin and Nurtec.   05/04/2020 ALL: She returns for follow up on migraines. She continues gabapentin 385m TID. Insurance previously denied coverage of Nurtec and Ubrelvy. She continues Remicaid infusions for Crohn's Disease. She is followed closely by GI. She remains on chronic opioids and followed closely by pain management, [facility]    Prevention medicaitons: Gabapentin, duloxetine, fluoxetine, levetiracetam,  Abortive meds: sumatriptan, rizatriptan, eletriptan (triptans contraindicated due to CVA history), naproxen, methocarbamol, butalbital, Nsaids (contraindicated due to Crohn's).    05/04/2019 ALL:  Victoria Alvarez a 30y.o. female here today for follow up for migraines. She continues gabapentin 3047mthree times daily. She admits that she sometimes forgets to take lunch dose. She reports having about 5 migraines per month. She states that headaches are always migrainous. We tried to get UbRoselyn Meierovered by her insurance before COVID but she reports that she did not [facility] it was not covered. She is seen monthly by pain management for Crohn's Disease treated with Remicaid infusions every 6 weeks and oxycodone 1547mvery 4 hours as needed. He daughter has recently gone back to school which has relieved some stress.    HISTORY: (copied from my note on 05/19/2018)  Interval history 05/19/2018: She reports that gabapentin has cut down on her headaches significantly. Headache days have reduced to about 1 per week. She has had  1-2 migraines per month. She is not currently using abortive therapy. She will rarely take 400m59muprofen for a really bad headache. Tylenol hurts her  stomach. She is not a good candidate for triptan therapy due to history of stroke. She has more stress with her daughter who is being worked up for possible seizures. She is caring for her 3 ye69r old. She is not currently working.    MRI/MRV show chronic findings but otherwise unremarkable.    She was recently started on Humira for Crohn's Disease.     HPI:  KatrKinnedy Mongielloa 27 y43. female here as a referral from Dr. Care for Migraines.  Past medical history hypercoagulable disorder, depression, chronic pain, cerebral venous sinus thrombosis , obesity, portal vein thrombosis, pulmonary embolism. with infarction. symptoms come on slowly, she has a history of headaches. Her migraines are on the right side of the head, she can't function, worsening in the setting of stress and children. Startes behind the right eye and spreads, more in the front, pounding and pulsating and throbbing, +phono/photophobia, triggers are light and sound, no significant nausea with the headaches but she gets dizzy. She sees black spots in her vision when she has the headaches and when they are very severe. She is compliant on her Xarelto. She is having the headaches 3x a week, can be severe, sleep helps, on average 6 hours. No FHx migraines. Phenergan helps. Ibuprofen doesn;t help, tylenol doesn;t help. She has blurry vision and dizziness and had a loss of consciousness due to severe headache.  She denies weakness. No weakness. No other focal neurologic deficits, associated symptoms, inciting events or modifiable factors.   Reviewed notes, labs and imaging from outside physicians, which showed:    Reviewed referring physician notes the patient is seen for chronic pain syndrome and she  is in the maintenance phase she is treated on morphine daily and notes improvement in physical functioning.  She has been completely compliant.  Concerning headache, location is primarily temporal and behind the eyes, she has had prior headaches  similar to this but the frequency is quite variable and duration is usually quite variable.  Severe aching and pounding, photophobia and phonophobia, headache is exacerbated with exposure to bright light and loud noise, improved with dim lights quite surroundings NSAIDs and narcotics.  Pertinent past medical history includes CVA.   REVIEW OF SYSTEMS: Out of a complete 14 system review of symptoms, the patient complains only of the following symptoms, headaches and all other reviewed systems are negative.  ALLERGIES: Allergies  Allergen Reactions   Bee Venom Swelling    Worse reaction if stung from the neck up Worse reaction if stung from the neck up    HOME MEDICATIONS: Outpatient Medications Prior to Visit  Medication Sig Dispense Refill   enoxaparin (LOVENOX) 80 MG/0.8ML injection Inject into the skin.     gabapentin (NEURONTIN) 300 MG capsule Take 1 capsule (300 mg total) by mouth 3 (three) times daily. 90 capsule 5   inFLIXimab (REMICADE IV) Inject into the vein.     ondansetron (ZOFRAN) 4 MG tablet Take 4 mg by mouth every 6 (six) hours as needed for nausea or vomiting.     oxyCODONE (ROXICODONE) 15 MG immediate release tablet Take 15 mg by mouth every 4 (four) hours as needed for pain.     promethazine (PHENERGAN) 12.5 MG tablet Take 1 tablet (12.5 mg total) by mouth every 6 (six) hours as needed for nausea or vomiting. 20 tablet 0   Rimegepant Sulfate (NURTEC) 75 MG TBDP Take 75 mg by mouth daily as needed (take for abortive therapy of migraine, no more than 1 tablet in 24 hours or 10 per month). 8 tablet 5   UNABLE TO FIND Med Name: Emergen-c Gummies OTC     No facility-administered medications prior to visit.    PAST MEDICAL HISTORY: Past Medical History:  Diagnosis Date   Anemia 07/2014   Microcytic   Asthma    Cerebral vein thrombosis 06/12/2014   Cerebral venous sinus thrombosis    Chronic pelvic pain in female    Depression 2007   Headache(784.0)    Hypercoagulable  state, primary ([place]) 07/2014   . no clear inherited or acquired cause for the patient's coagulopathy   Interstitial cystitis    pain from this was reason for starting opioids age 38.    Miscarriage 2005   Morbid obesity ([place])    BMI 37 in 11/2014.   Non-compliance    with anticoagulant   Numbness    right side   Opioid dependence ([place])    to Rx opioids.  switched to Methadone after second child born in 04/2014   Pneumatosis of intestines 10/2014   Pneumonia 03/2014   Portal vein thrombosis 10/11/14   Preeclampsia 2012   Pulmonary embolism ([place])    Pyelonephritis affecting pregnancy in first trimester July 2015   Seizures [facility])    Stroke ([place]) 05/2014   presented with right sided weakness.     PAST SURGICAL HISTORY: Past Surgical History:  Procedure Laterality Date   ABDOMINAL SURGERY     CESAREAN SECTION N/A 05/06/2014   Procedure: CESAREAN SECTION;  Surgeon: Truett Mainland, DO;  Location: [facility];  Service: Obstetrics;  Laterality: N/A;   COLONOSCOPY WITH PROPOFOL N/A 11/18/2014   Procedure: COLONOSCOPY WITH  PROPOFOL;  Surgeon: Milus Banister, MD;  Location: [facility];  Service: Endoscopy;  Laterality: N/A;   INGUINAL HERNIA REPAIR Left 2008   VAGINA SURGERY  ~ 2011   benign vaginal tumor removed at [place]: Family History  Problem Relation Age of Onset   Diabetes Mother    Heart disease Mother    Anxiety disorder Mother    Depression Mother    Hypertension Father    CAD Father    Diabetes Maternal Grandmother    Hypertension Maternal Grandmother    Stroke Maternal Grandmother    Hypothyroidism Maternal Grandmother    Diabetes Maternal Grandfather    Hypertension Maternal Grandfather    Diabetes Paternal Grandmother    Diabetes Paternal Grandfather    Hypertension Paternal Grandfather     SOCIAL HISTORY: Social History   Socioeconomic History   Marital status: Single    Spouse name: Not on file   Number of children: 2   Years of education:  9   Highest education level: Not on file  Occupational History   Occupation: homemaker  Tobacco Use   Smoking status: Every Day    Packs/day: 0.25    Years: 8.00    Pack years: 2.00    Types: Cigarettes   Smokeless tobacco: Never   Tobacco comments:    cutting back  Vaping Use   Vaping Use: Never used  Substance and Sexual Activity   Alcohol use: No   Drug use: No    Comment: Pt on Methadone "was on pain pills; weaned off while I was pregnant so it would be safer"   Sexual activity: Yes    Birth control/protection: None  Other Topics Concern   Not on file  Social History Narrative   Has significant other.  Children 2.  Occupation: Materials engineer.   Right handed   Caffeine use - none   Social Determinants of Health   Financial Resource Strain: Not on file  Food Insecurity: Not on file  Transportation Needs: Not on file  Physical Activity: Not on file  Stress: Not on file  Social Connections: Not on file  Intimate Partner Violence: Not on file      PHYSICAL EXAM  There were no vitals filed for this visit.  There is no height or weight on file to calculate BMI.  Generalized: Well developed, in no acute distress  Cardiology: normal rate and rhythm, no murmur noted Respiratory: Clear to auscultation bilaterally  Neurological examination  Mentation: Alert oriented to time, place, history taking. Follows all commands speech and language fluent Cranial nerve II-XII: Pupils were equal round reactive to light. Extraocular movements were full, visual field were full on confrontational test. Facial sensation and strength were normal. Uvula tongue midline. Head turning and shoulder shrug  were normal and symmetric. Motor: The motor testing reveals 5 over 5 strength of all 4 extremities. Good symmetric motor tone is noted throughout.  Sensory: Sensory testing is intact to soft touch on all 4 extremities. No evidence of extinction is noted.  Coordination: Cerebellar testing  reveals good finger-nose-finger and heel-to-shin bilaterally.  Gait and station: Gait is normal.  Reflexes: Deep tendon reflexes are symmetric and normal bilaterally.   DIAGNOSTIC DATA (LABS, IMAGING, TESTING) - I reviewed patient records, labs, notes, testing and imaging myself where available.  No flowsheet data found.   Lab Results  Component Value Date   WBC 6.5 03/12/2020   HGB 14.6 03/12/2020   HCT 43.0  03/12/2020   MCV 94.3 03/12/2020   PLT 186 03/12/2020      Component Value Date/Time   NA 140 03/12/2020 2113   K 3.7 03/12/2020 2113   CL 102 03/12/2020 2113   CO2 26 03/12/2020 2038   GLUCOSE 94 03/12/2020 2113   BUN 8 03/12/2020 2113   CREATININE 0.90 03/12/2020 2113   CREATININE 0.81 07/12/2014 1717   CALCIUM 9.0 03/12/2020 2038   PROT 6.2 (L) 03/12/2020 2038   ALBUMIN 3.4 (L) 03/12/2020 2038   AST 22 03/12/2020 2038   ALT 15 03/12/2020 2038   ALKPHOS 65 03/12/2020 2038   BILITOT 1.1 03/12/2020 2038   GFRNONAA >60 03/12/2020 2038   GFRNONAA >89 07/12/2014 1717   GFRAA >60 11/20/2018 1155   GFRAA >89 07/12/2014 1717   Lab Results  Component Value Date   CHOL 194 03/25/2016   HDL 44 03/25/2016   LDLCALC 130 (H) 03/25/2016   TRIG 100 03/25/2016   CHOLHDL 4.4 03/25/2016   Lab Results  Component Value Date   HGBA1C 4.9 03/25/2016   No results found for: [id number] Lab Results  Component Value Date   TSH 4.397 11/16/2014       ASSESSMENT AND PLAN 30 y.o. year old female  has a past medical history of Anemia (07/2014), Asthma, Cerebral vein thrombosis (06/12/2014), Cerebral venous sinus thrombosis, Chronic pelvic pain in female, Depression (2007), Headache(784.0), Hypercoagulable state, primary ([place]) (07/2014), Interstitial cystitis, Miscarriage (2005), Morbid obesity ([place]), Non-compliance, Numbness, Opioid dependence ([place]), Pneumatosis of intestines (10/2014), Pneumonia (03/2014), Portal vein thrombosis (10/11/14), Preeclampsia (2012), Pulmonary embolism ([place]),  Pyelonephritis affecting pregnancy in first trimester (July 2015), Seizures [facility]), and Stroke ([place]) (05/2014). here with   No diagnosis found.    Courtenay continues to have regular migraine days. She will continue gabapentin 328m TID. I am hesitant to increase dose as she is also on chronic opioids managed by pain provider. She is a single mom. She is limited on abortive therapy options due to medical history. We will try to get Nurtec covered. She should not take triptan s/p stroke and Nsaids worsen GI complaints. She will stay well hydrated and work on a healthy well-balanced diet.  Regular exercise also advised. Stress management with primary care.  She will follow-up with me in 1 year, sooner if needed.  She verbalizes understanding and agreement with this plan.   No orders of the defined types were placed in this encounter.    No orders of the defined types were placed in this encounter.     ADebbora Presto FNP-C 02/19/2021, 7:02 AM [facility] [address] SBonnerGFossil [place] [phone]

## 2021-03-15 ENCOUNTER — Telehealth: Payer: Self-pay | Admitting: Family Medicine

## 2021-03-15 DIAGNOSIS — G43109 Migraine with aura, not intractable, without status migrainosus: Secondary | ICD-10-CM

## 2021-03-15 MED ORDER — GABAPENTIN 300 MG PO CAPS: 300.0000 mg | ORAL_CAPSULE | Freq: Three times a day (TID) | ORAL | 2 refills | Status: DC

## 2021-03-15 MED ORDER — NURTEC 75 MG PO TBDP: 75.0000 mg | ORAL_TABLET | Freq: Every day | ORAL | 0 refills | Status: DC | PRN

## 2021-03-15 NOTE — Telephone Encounter (Signed)
Pt is requesting a refill for gabapentin (NEURONTIN) 300 MG capsule & Rimegepant Sulfate (NURTEC) 75 MG TBDP.  Pharmacy: [facility]  Pt confirmed not changes to her insurance in the [place]

## 2021-03-15 NOTE — Telephone Encounter (Signed)
Refills sent for the pt to last until her march follow up apt

## 2021-05-07 ENCOUNTER — Telehealth: Payer: Self-pay

## 2021-05-07 NOTE — Telephone Encounter (Signed)
I have submitted a PA request for Nurtec 75mg  on [place] [employer], Prior Approval [id number].  Approved

## 2021-05-23 NOTE — Progress Notes (Deleted)
? ? ?PATIENT: Victoria Alvarez ?DOB: Feb 28, 1991 ? ?REASON FOR VISIT: follow up ?HISTORY FROM: patient ? ?No chief complaint on file. ?  ? ?HISTORY OF PRESENT ILLNESS: ?05/23/21 ALL:  ?Victoria Alvarez returns for migraines follow up. She continues gabapentin 384m TID and Nurtec.  ? ?05/04/2020 ALL:  ?She returns for follow up on migraines. She continues gabapentin 3040mTID. Insurance previously denied coverage of Nurtec and Ubrelvy. She continues Remicaid infusions for Crohn's Disease. She is followed closely by GI. She remains on chronic opioids and followed closely by pain management, [facility]  ? ?Prevention medicaitons: Gabapentin, duloxetine, fluoxetine, levetiracetam,  ?Abortive meds: sumatriptan, rizatriptan, eletriptan (triptans contraindicated due to CVA history), naproxen, methocarbamol, butalbital, Nsaids (contraindicated due to Crohn's).  ? ? ?05/04/2019 ALL:  ?Victoria Alvarez a [age].o. female here today for follow up for migraines. She continues gabapentin 30022mhree times daily. She admits that she sometimes forgets to take lunch dose. She reports having about 5 migraines per month. She states that headaches are always migrainous. We tried to get UbrRoselyn Meiervered by her insurance before COVID but she reports that she did not hear anything back from [facility] told her it was not covered. She is seen monthly by pain management for Crohn's Disease treated with Remicaid infusions every 6 weeks and oxycodone 82m66mery 4 hours as needed. He daughter has recently gone back to school which has relieved some stress.  ? ? ?HISTORY: (copied from my note on 05/19/2018) ? ?Interval history 05/19/2018: She reports that gabapentin has cut down on her headaches significantly. Headache days have reduced to about 1 per week. She has had  1-2 migraines per month. She is not currently using abortive therapy. She will rarely take 400mg45mprofen for a really bad headache. Tylenol hurts  her stomach. She is not a good candidate for triptan therapy due to history of stroke. She has more stress with her daughter who is being worked up for possible seizures. She is caring for her 3 yea32 old. She is not currently working.  ?  ?MRI/MRV show chronic findings but otherwise unremarkable.  ?  ?She was recently started on Humira for Crohn's Disease.   ?  ?HPI:  Victoria Alvarez 31 y.31 female here as a referral from Dr. Care for Migraines.  Past medical history hypercoagulable disorder, depression, chronic pain, cerebral venous sinus thrombosis , obesity, portal vein thrombosis, pulmonary embolism. with infarction. symptoms come on slowly, she has a history of headaches. Her migraines are on the right side of the head, she can't function, worsening in the setting of stress and children. Startes behind the right eye and spreads, more in the front, pounding and pulsating and throbbing, +phono/photophobia, triggers are light and sound, no significant nausea with the headaches but she gets dizzy. She sees black spots in her vision when she has the headaches and when they are very severe. She is compliant on her Xarelto. She is having the headaches 3x a week, can be severe, sleep helps, on average 6 hours. No FHx migraines. Phenergan helps. Ibuprofen doesn;t help, tylenol doesn;t help. She has blurry vision and dizziness and had a loss of consciousness due to severe headache.  She denies weakness. No weakness. No other focal neurologic deficits, associated symptoms, inciting events or modifiable factors. ?  ?Reviewed notes, labs and imaging from outside physicians, which showed:  ?  ?Reviewed referring physician notes the patient is seen for chronic pain syndrome and  she is in the maintenance phase she is treated on morphine daily and notes improvement in physical functioning.  She has been completely compliant.  Concerning headache, location is primarily temporal and behind the eyes, she has had prior  headaches similar to this but the frequency is quite variable and duration is usually quite variable.  Severe aching and pounding, photophobia and phonophobia, headache is exacerbated with exposure to bright light and loud noise, improved with dim lights quite surroundings NSAIDs and narcotics.  Pertinent past medical history includes CVA. ? ? ?REVIEW OF SYSTEMS: Out of a complete 14 system review of symptoms, the patient complains only of the following symptoms, headaches and all other reviewed systems are negative. ? ?ALLERGIES: ?Allergies  ?Allergen Reactions  ? Bee Venom Swelling  ?  Worse reaction if stung from the neck up ?Worse reaction if stung from the neck up  ? ? ?HOME MEDICATIONS: ?Outpatient Medications Prior to Visit  ?Medication Sig Dispense Refill  ? enoxaparin (LOVENOX) 80 MG/0.8ML injection Inject into the skin.    ? gabapentin (NEURONTIN) 300 MG capsule Take 1 capsule (300 mg total) by mouth 3 (three) times daily. 90 capsule 2  ? inFLIXimab (REMICADE IV) Inject into the vein.    ? ondansetron (ZOFRAN) 4 MG tablet Take 4 mg by mouth every 6 (six) hours as needed for nausea or vomiting.    ? oxyCODONE (ROXICODONE) 15 MG immediate release tablet Take 15 mg by mouth every 4 (four) hours as needed for pain.    ? promethazine (PHENERGAN) 12.5 MG tablet Take 1 tablet (12.5 mg total) by mouth every 6 (six) hours as needed for nausea or vomiting. 20 tablet 0  ? Rimegepant Sulfate (NURTEC) 75 MG TBDP Take 75 mg by mouth daily as needed (take for abortive therapy of migraine, no more than 1 tablet in 24 hours or 10 per month). 8 tablet 0  ? UNABLE TO FIND Med Name: Emergen-c Gummies OTC    ? ?No facility-administered medications prior to visit.  ? ? ?PAST MEDICAL HISTORY: ?Past Medical History:  ?Diagnosis Date  ? Anemia 07/2014  ? Microcytic  ? Asthma   ? Cerebral vein thrombosis 06/12/2014  ? Cerebral venous sinus thrombosis   ? Chronic pelvic pain in female   ? Depression 2007  ? Headache(784.0)   ?  Hypercoagulable state, primary ([place]) 07/2014  ? [name] no clear inherited or acquired cause for the patient's coagulopathy  ? Interstitial cystitis   ? pain from this was reason for starting opioids age 13.   ? Miscarriage 2005  ? Morbid obesity ([place])   ? BMI 37 in 11/2014.  ? Non-compliance   ? with anticoagulant  ? Numbness   ? right side  ? Opioid dependence ([place])   ? to Rx opioids.  switched to Methadone after second child born in 04/2014  ? Pneumatosis of intestines 10/2014  ? Pneumonia 03/2014  ? Portal vein thrombosis 10/11/14  ? Preeclampsia 2012  ? Pulmonary embolism ([place])   ? Pyelonephritis affecting pregnancy in first trimester July 2015  ? Seizures ([place])   ? Stroke [facility]) 05/2014  ? presented with right sided weakness.   ? ? ?PAST SURGICAL HISTORY: ?Past Surgical History:  ?Procedure Laterality Date  ? ABDOMINAL SURGERY    ? CESAREAN SECTION N/A 05/06/2014  ? Procedure: CESAREAN SECTION;  Surgeon: Truett Mainland, DO;  Location: [facility];  Service: Obstetrics;  Laterality: N/A;  ? COLONOSCOPY WITH PROPOFOL N/A 11/18/2014  ? Procedure: COLONOSCOPY  WITH PROPOFOL;  Surgeon: Milus Banister, MD;  Location: [facility];  Service: Endoscopy;  Laterality: N/A;  ? INGUINAL HERNIA REPAIR Left 2008  ? VAGINA SURGERY  ~ 2011  ? benign vaginal tumor removed at [facility].   ? ? ?FAMILY HISTORY: ?Family History  ?Problem Relation Age of Onset  ? Diabetes Mother   ? Heart disease Mother   ? Anxiety disorder Mother   ? Depression Mother   ? Hypertension Father   ? CAD Father   ? Diabetes Maternal Grandmother   ? Hypertension Maternal Grandmother   ? Stroke Maternal Grandmother   ? Hypothyroidism Maternal Grandmother   ? Diabetes Maternal Grandfather   ? Hypertension Maternal Grandfather   ? Diabetes Paternal Grandmother   ? Diabetes Paternal Grandfather   ? Hypertension Paternal Grandfather   ? ? ?SOCIAL HISTORY: ?Social History  ? ?Socioeconomic History  ? Marital status: Single  ?  Spouse name: Not on file  ? Number of children: 2  ?  Years of education: 31  ? Highest education level: Not on file  ?Occupational History  ? Occupation: homemaker  ?Tobacco Use  ? Smoking status: Every Day  ?  Packs/day: 0.25  ?  Years: 8.00  ?  Pack years: 2.00  ?  Types:

## 2021-05-23 NOTE — Patient Instructions (Incomplete)

## 2021-05-24 ENCOUNTER — Ambulatory Visit: Payer: Medicaid Other | Admitting: Family Medicine

## 2021-05-24 ENCOUNTER — Encounter: Payer: Self-pay | Admitting: Family Medicine

## 2021-05-24 DIAGNOSIS — G43109 Migraine with aura, not intractable, without status migrainosus: Secondary | ICD-10-CM

## 2021-05-31 ENCOUNTER — Other Ambulatory Visit: Payer: Self-pay

## 2021-05-31 MED ORDER — GABAPENTIN 300 MG PO CAPS: 300.0000 mg | ORAL_CAPSULE | Freq: Three times a day (TID) | ORAL | 5 refills | Status: DC

## 2021-09-18 NOTE — Patient Instructions (Incomplete)
Below is our plan:  We will continue gabapentin 300mg  three times daily and Nurtec as needed. Please continue close follow up with PCP/pain management and GI.   Please make sure you are staying well hydrated. I recommend 50-60 ounces daily. Well balanced diet and regular exercise encouraged. Consistent sleep schedule with 6-8 hours recommended.   Please continue follow up with care team as directed.   Follow up with PCP for refills if willing. If not, I will be happy to see you next year.   You may receive a survey regarding today's visit. I encourage you to leave honest feed back as I do use this information to improve patient care. Thank you for seeing me today!

## 2021-09-18 NOTE — Progress Notes (Unsigned)
PATIENT: Victoria Alvarez DOB: [birth year]  REASON FOR VISIT: follow up HISTORY FROM: patient  Virtual Visit via Telephone Note  I connected with Victoria Alvarez on 09/19/21 at  2:30 PM EDT by telephone and verified that I am speaking with the correct person using two identifiers.   I discussed the limitations, risks, security and privacy concerns of performing an evaluation and management service by telephone and the availability of in person appointments. I also discussed with the patient that there may be a patient responsible charge related to this service. The patient expressed understanding and agreed to proceed.   History of Present Illness:  09/18/21 ALL:  [name] returns for follow up for migraines. She continues gabapentin and Nurtec. She reports that she was doing really well until around 11/2020 when she found out she was pregnant. She delivered a premature daughter by cesarean section 01/30/2022. She was unaware of pregnancy. GI tested her due to increased stomach pain. Gabapentin was continued but Remicade stopped during to pregnancy. She reports her daughter is doing well. She is now 32mhs. Shay reports that she is doing fairly well. Migraines are at a minimum. Nurtec works very well. She continues to follow closely with GI and PCP/pain management. She is planning to have a colonoscopy soon to determine need to resume Remicaid.   05/04/2020 ALL: She returns for follow up on migraines. She continues gabapentin 3074mTID. Insurance previously denied coverage of Nurtec and Ubrelvy. She continues Remicaid infusions for Crohn's Disease. She is followed closely by GI. She remains on chronic opioids and followed closely by pain management, [facility]    Prevention medicaitons: Gabapentin, duloxetine, fluoxetine, levetiracetam,  Abortive meds: sumatriptan, rizatriptan, eletriptan (triptans contraindicated due to CVA history), naproxen,  methocarbamol, butalbital, Nsaids (contraindicated due to Crohn's).    05/04/2019 ALL:  KaKameo Bainss a 89.o. female here today for follow up for migraines. She continues gabapentin 30066mhree times daily. She admits that she sometimes forgets to take lunch dose. She reports having about 5 migraines per month. She states that headaches are always migrainous. We tried to get UbrRoselyn Meiervered by her insurance before COVID but she reports that she did not hear anything back from us [facility] told her it was not covered. She is seen monthly by pain management for Crohn's Disease treated with Remicaid infusions every 6 weeks and oxycodone 69m25mery 4 hours as needed. He daughter has recently gone back to school which has relieved some stress.    HISTORY: (copied from my note on 05/19/2018)  Interval history 05/19/2018: She reports that gabapentin has cut down on her headaches significantly. Headache days have reduced to about 1 per week. She has had  1-2 migraines per month. She is not currently using abortive therapy. She will rarely take 400mg60mprofen for a really bad headache. Tylenol hurts her stomach. She is not a good candidate for triptan therapy due to history of stroke. She has more stress with her daughter who is being worked up for possible seizures. She is caring for her 3 yea51 old. She is not currently working.    MRI/MRV show chronic findings but otherwise unremarkable.    She was recently started on Humira for Crohn's Disease.     HPI:  Victoria Alvarez 27 y.109 female here as a referral from Dr. Care for Migraines.  Past medical history hypercoagulable disorder, depression, chronic pain, cerebral venous sinus thrombosis , obesity, portal vein thrombosis, pulmonary  embolism. with infarction. symptoms come on slowly, she has a history of headaches. Her migraines are on the right side of the head, she can't function, worsening in the setting of stress and children. Startes behind  the right eye and spreads, more in the front, pounding and pulsating and throbbing, +phono/photophobia, triggers are light and sound, no significant nausea with the headaches but she gets dizzy. She sees black spots in her vision when she has the headaches and when they are very severe. She is compliant on her Xarelto. She is having the headaches 3x a week, can be severe, sleep helps, on average 6 hours. No FHx migraines. Phenergan helps. Ibuprofen doesn;t help, tylenol doesn;t help. She has blurry vision and dizziness and had a loss of consciousness due to severe headache.  She denies weakness. No weakness. No other focal neurologic deficits, associated symptoms, inciting events or modifiable factors.   Reviewed notes, labs and imaging from outside physicians, which showed:    Reviewed referring physician notes the patient is seen for chronic pain syndrome and she is in the maintenance phase she is treated on morphine daily and notes improvement in physical functioning.  She has been completely compliant.  Concerning headache, location is primarily temporal and behind the eyes, she has had prior headaches similar to this but the frequency is quite variable and duration is usually quite variable.  Severe aching and pounding, photophobia and phonophobia, headache is exacerbated with exposure to bright light and loud noise, improved with dim lights quite surroundings NSAIDs and narcotics.  Pertinent past medical history includes CVA.    Observations/Objective:  Generalized: Well developed, in no acute distress  Mentation: Alert oriented to time, place, history taking. Follows all commands speech and language fluent   Assessment and Plan:  31 y.o. year old female  has a past medical history of Anemia (07/2014), Asthma, Cerebral vein thrombosis (06/12/2014), Cerebral venous sinus thrombosis, Chronic pelvic pain in female, Depression (2007), Headache(784.0), Hypercoagulable state, primary (Victoria Alvarez) (07/2014),  Interstitial cystitis, Miscarriage (2005), Morbid obesity ([place]), Non-compliance, Numbness, Opioid dependence ([place]), Pneumatosis of intestines (10/2014), Pneumonia (03/2014), Portal vein thrombosis (10/11/14), Preeclampsia (2012), Pulmonary embolism ([place]), Pyelonephritis affecting pregnancy in first trimester (July 2015), Seizures [facility]), and Stroke ([place]) (05/2014). here with    ICD-10-CM   1. Migraine with aura and without status migrainosus, not intractable  G43.109 Rimegepant Sulfate (NURTEC) 75 MG TBDP     Lucianna reports migraines are well managed on gabapentin 320m three times daily and Nurtec as needed. We will continue current plan. She was encouraged to continue healthy lifestyle habits. Refills sent. May follow up with PCP for further refills if headaches remain stable. Otherwise, follow up with me in 1 year.   No orders of the defined types were placed in this encounter.   Meds ordered this encounter  Medications   Rimegepant Sulfate (NURTEC) 75 MG TBDP    Sig: Take 75 mg by mouth daily as needed (take for abortive therapy of migraine, no more than 1 tablet in 24 hours or 10 per month).    Dispense:  8 tablet    Refill:  11    Order Specific Question:   Supervising Provider    Answer:   AMelvenia Beam[[[id number]]  gabapentin (NEURONTIN) 300 MG capsule    Sig: Take 1 capsule (300 mg total) by mouth 3 (three) times daily.    Dispense:  90 capsule    Refill:  11    Order Specific Question:  Supervising Provider    Answer:   Melvenia Beam [[id number]]     Follow Up Instructions:  I discussed the assessment and treatment plan with the patient. The patient was provided an opportunity to ask questions and all were answered. The patient agreed with the plan and demonstrated an understanding of the instructions.   The patient was advised to call back or seek an in-person evaluation if the symptoms worsen or if the condition fails to improve as anticipated.  I provided 15 minutes of  non-face-to-face time during this encounter. Patient located at their place of residence during [place] visit. Provider is in the office.    Debbora Presto, NP

## 2021-09-19 ENCOUNTER — Telehealth: Payer: Self-pay | Admitting: Family Medicine

## 2021-09-19 ENCOUNTER — Encounter: Payer: Self-pay | Admitting: Family Medicine

## 2021-09-19 ENCOUNTER — Telehealth (INDEPENDENT_AMBULATORY_CARE_PROVIDER_SITE_OTHER): Payer: Medicaid Other | Admitting: Family Medicine

## 2021-09-19 DIAGNOSIS — G43109 Migraine with aura, not intractable, without status migrainosus: Secondary | ICD-10-CM

## 2021-09-19 MED ORDER — NURTEC 75 MG PO TBDP: 75.0000 mg | ORAL_TABLET | Freq: Every day | ORAL | 11 refills | Status: AC | PRN

## 2021-09-19 MED ORDER — GABAPENTIN 300 MG PO CAPS: 300.0000 mg | ORAL_CAPSULE | Freq: Three times a day (TID) | ORAL | 11 refills | Status: AC

## 2021-09-19 NOTE — Telephone Encounter (Signed)
..   Pt understands that although there may be some limitations with this type of visit, we will take all precautions to reduce any security or privacy concerns.  Pt understands that this will be treated like an in office visit and we will file with pt's insurance, and there may be a patient responsible charge related to this service. ? ?

## 2022-09-02 ENCOUNTER — Emergency Department (HOSPITAL_COMMUNITY)
Admission: EM | Admit: 2022-09-02 | Discharge: 2022-09-02 | Disposition: A | Discharge status: Home / Self Care | Payer: Medicaid Other | Attending: Emergency Medicine | Admitting: Emergency Medicine

## 2022-09-02 ENCOUNTER — Emergency Department (HOSPITAL_COMMUNITY): Payer: Medicaid Other

## 2022-09-02 ENCOUNTER — Other Ambulatory Visit: Payer: Self-pay

## 2022-09-02 DIAGNOSIS — E876 Hypokalemia: Secondary | ICD-10-CM | POA: Diagnosis not present

## 2022-09-02 DIAGNOSIS — Y900 Blood alcohol level of less than 20 mg/100 ml: Secondary | ICD-10-CM | POA: Diagnosis not present

## 2022-09-02 DIAGNOSIS — E871 Hypo-osmolality and hyponatremia: Secondary | ICD-10-CM | POA: Insufficient documentation

## 2022-09-02 DIAGNOSIS — Y92410 Unspecified street and highway as the place of occurrence of the external cause: Secondary | ICD-10-CM | POA: Insufficient documentation

## 2022-09-02 DIAGNOSIS — R451 Restlessness and agitation: Secondary | ICD-10-CM | POA: Insufficient documentation

## 2022-09-02 DIAGNOSIS — F10129 Alcohol abuse with intoxication, unspecified: Secondary | ICD-10-CM | POA: Insufficient documentation

## 2022-09-02 DIAGNOSIS — R4182 Altered mental status, unspecified: Secondary | ICD-10-CM | POA: Diagnosis present

## 2022-09-02 DIAGNOSIS — R410 Disorientation, unspecified: Secondary | ICD-10-CM | POA: Diagnosis not present

## 2022-09-02 LAB — RAPID URINE DRUG SCREEN, HOSP PERFORMED
Amphetamines: NOT DETECTED
Barbiturates: NOT DETECTED
Benzodiazepines: POSITIVE — AB
Cocaine: POSITIVE — AB
Opiates: NOT DETECTED
Tetrahydrocannabinol: NOT DETECTED

## 2022-09-02 LAB — COMPREHENSIVE METABOLIC PANEL
ALT: 13 U/L (ref 0–44)
AST: 16 U/L (ref 15–41)
Albumin: 3.7 g/dL (ref 3.5–5.0)
Alkaline Phosphatase: 51 U/L (ref 38–126)
Anion gap: 12 (ref 5–15)
BUN: 9 mg/dL (ref 6–20)
CO2: 17 mmol/L — ABNORMAL LOW (ref 22–32)
Calcium: 9.5 mg/dL (ref 8.9–10.3)
Chloride: 105 mmol/L (ref 98–111)
Creatinine, Ser: 0.82 mg/dL (ref 0.44–1.00)
GFR, Estimated: >60 mL/min (ref >60)
Glucose, Bld: 132 mg/dL — ABNORMAL HIGH (ref 70–99)
Potassium: 3.3 mmol/L — ABNORMAL LOW (ref 3.5–5.1)
Sodium: 134 mmol/L — ABNORMAL LOW (ref 135–145)
Total Bilirubin: 0.7 mg/dL (ref 0.3–1.2)
Total Protein: 6.3 g/dL — ABNORMAL LOW (ref 6.5–8.1)

## 2022-09-02 LAB — CBC WITH DIFFERENTIAL/PLATELET
Abs Immature Granulocytes: 0 10*3/uL (ref 0.00–0.07)
Basophils Absolute: 0 10*3/uL (ref 0.0–0.1)
Basophils Relative: 0 %
Eosinophils Absolute: 0 10*3/uL (ref 0.0–0.5)
Eosinophils Relative: 0 %
HCT: 36.7 % (ref 36.0–46.0)
Hemoglobin: 11.8 g/dL — ABNORMAL LOW (ref 12.0–15.0)
Immature Granulocytes: 0 %
Lymphocytes Relative: 24 %
Lymphs Abs: 1.3 10*3/uL (ref 0.7–4.0)
MCH: 26.5 pg (ref 26.0–34.0)
MCHC: 32.2 g/dL (ref 30.0–36.0)
MCV: 82.3 fL (ref 80.0–100.0)
Monocytes Absolute: 0.4 10*3/uL (ref 0.1–1.0)
Monocytes Relative: 7 %
Neutro Abs: 3.9 10*3/uL (ref 1.7–7.7)
Neutrophils Relative %: 69 %
Platelets: 183 10*3/uL (ref 150–400)
RBC: 4.46 MIL/uL (ref 3.87–5.11)
RDW: 15.2 % (ref 11.5–15.5)
WBC: 5.6 10*3/uL (ref 4.0–10.5)
nRBC: 0 % (ref 0.0–0.2)

## 2022-09-02 LAB — ETHANOL: Alcohol, Ethyl (B): <10 mg/dL (ref <10)

## 2022-09-02 LAB — I-STAT BETA HCG BLOOD, ED (MC, WL, AP ONLY): I-stat hCG, quantitative: <5 m[IU]/mL (ref <5)

## 2022-09-02 LAB — SALICYLATE LEVEL: Salicylate Lvl: <7 mg/dL — ABNORMAL LOW (ref 7.0–30.0)

## 2022-09-02 LAB — ACETAMINOPHEN LEVEL: Acetaminophen (Tylenol), Serum: <10 ug/mL — ABNORMAL LOW (ref 10–30)

## 2022-09-02 MED ORDER — DROPERIDOL 2.5 MG/ML IJ SOLN: 5.0000 mg | Freq: Once | INTRAMUSCULAR | Status: DC

## 2022-09-02 MED ORDER — STERILE WATER FOR INJECTION IJ SOLN
INTRAMUSCULAR | Status: AC
  Administered 2022-09-02: 2 mL
  Filled 2022-09-02: qty 10

## 2022-09-02 MED ORDER — OLANZAPINE 10 MG IM SOLR
10.0000 mg | Freq: Once | INTRAMUSCULAR | Status: AC
  Administered 2022-09-02: 10 mg via INTRAMUSCULAR
  Filled 2022-09-02 (×2): qty 10

## 2022-09-02 MED ORDER — KETAMINE HCL 50 MG/5ML IJ SOSY
100.0000 mg | PREFILLED_SYRINGE | Freq: Once | INTRAMUSCULAR | Status: AC
  Administered 2022-09-02: 100 mg via INTRAVENOUS

## 2022-09-02 MED ORDER — KETAMINE HCL 50 MG/5ML IJ SOSY
PREFILLED_SYRINGE | INTRAMUSCULAR | Status: AC
  Filled 2022-09-02: qty 10

## 2022-09-02 MED ORDER — MIDAZOLAM HCL 2 MG/2ML IJ SOLN
2.0000 mg | Freq: Once | INTRAMUSCULAR | Status: AC
  Administered 2022-09-02: 2 mg via INTRAVENOUS
  Filled 2022-09-02: qty 2

## 2022-09-02 MED ORDER — DIPHENHYDRAMINE HCL 50 MG/ML IJ SOLN: 50.0000 mg | Freq: Once | INTRAMUSCULAR | Status: AC

## 2022-09-02 MED ORDER — DROPERIDOL 2.5 MG/ML IJ SOLN
5.0000 mg | Freq: Once | INTRAMUSCULAR | Status: AC
  Administered 2022-09-02: 5 mg via INTRAVENOUS

## 2022-09-02 MED ORDER — DIPHENHYDRAMINE HCL 50 MG/ML IJ SOLN
INTRAMUSCULAR | Status: AC
  Administered 2022-09-02: 50 mg via INTRAVENOUS
  Filled 2022-09-02: qty 1

## 2022-09-02 MED ORDER — HALOPERIDOL LACTATE 5 MG/ML IJ SOLN
5.0000 mg | Freq: Once | INTRAMUSCULAR | Status: AC
Start: 2022-09-02 — End: 2022-09-02
  Administered 2022-09-02: 5 mg via INTRAVENOUS

## 2022-09-02 MED ORDER — HALOPERIDOL LACTATE 5 MG/ML IJ SOLN
INTRAMUSCULAR | Status: AC
  Filled 2022-09-02: qty 1
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
## 2022-09-02 NOTE — ED Notes (Signed)
Patient ambulated to restroom with minimal assistance. ?

## 2022-09-02 NOTE — ED Notes (Signed)
Pt currently altered, pulling at lines, thought that dog was pulling at her foot. Tossing in bed attempting to remove restraints stating she is ready to go. Restraints remain in place at this time.

## 2022-09-02 NOTE — ED Notes (Signed)
Pt continuing to be combative. Soft wrist restraints applied.

## 2022-09-02 NOTE — Progress Notes (Signed)
Orthopedic Tech Progress Note Patient Details:  Victoria Alvarez Jul 24, 1990 [id number]  Patient ID: Victoria Alvarez, female   DOB: 1990-12-14, 32 y.o.   MRN: [id number] I attended trauma page. Trinna Post 09/02/2022, 2:18 AM

## 2022-09-02 NOTE — ED Notes (Signed)
Patient transported to CT with TRN Ryan.  
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
## 2022-09-02 NOTE — ED Notes (Signed)
Pt awake & calling family again for a ride.

## 2022-09-20 ENCOUNTER — Emergency Department (HOSPITAL_COMMUNITY)
Admission: EM | Admit: 2022-09-20 | Discharge: 2022-09-21 | Disposition: A | Discharge status: Home / Self Care | Payer: MEDICAID | Attending: Emergency Medicine | Admitting: Emergency Medicine

## 2022-09-20 ENCOUNTER — Emergency Department (HOSPITAL_COMMUNITY): Payer: MEDICAID

## 2022-09-20 ENCOUNTER — Other Ambulatory Visit: Payer: Self-pay

## 2022-09-20 ENCOUNTER — Encounter (HOSPITAL_COMMUNITY): Payer: Self-pay | Admitting: Emergency Medicine

## 2022-09-20 DIAGNOSIS — M25552 Pain in left hip: Secondary | ICD-10-CM | POA: Diagnosis not present

## 2022-09-20 DIAGNOSIS — Y92410 Unspecified street and highway as the place of occurrence of the external cause: Secondary | ICD-10-CM | POA: Insufficient documentation

## 2022-09-20 DIAGNOSIS — S0081XA Abrasion of other part of head, initial encounter: Secondary | ICD-10-CM | POA: Insufficient documentation

## 2022-09-20 DIAGNOSIS — S0990XA Unspecified injury of head, initial encounter: Secondary | ICD-10-CM | POA: Diagnosis present

## 2022-09-20 LAB — I-STAT CHEM 8, ED
BUN: 6 mg/dL (ref 6–20)
Calcium, Ion: 1.1 mmol/L — ABNORMAL LOW (ref 1.15–1.40)
Chloride: 105 mmol/L (ref 98–111)
Creatinine, Ser: 1.1 mg/dL — ABNORMAL HIGH (ref 0.44–1.00)
Glucose, Bld: 68 mg/dL — ABNORMAL LOW (ref 70–99)
HCT: 45 % (ref 36.0–46.0)
Hemoglobin: 15.3 g/dL — ABNORMAL HIGH (ref 12.0–15.0)
Potassium: 2.9 mmol/L — ABNORMAL LOW (ref 3.5–5.1)
Sodium: 141 mmol/L (ref 135–145)
TCO2: 23 mmol/L (ref 22–32)

## 2022-09-20 LAB — ETHANOL: Alcohol, Ethyl (B): 66 mg/dL — ABNORMAL HIGH (ref <10)
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
## 2022-09-20 NOTE — ED Notes (Signed)
Patient in CT

## 2022-09-21 LAB — CBC
HCT: 42.4 % (ref 36.0–46.0)
Hemoglobin: 13.7 g/dL (ref 12.0–15.0)
MCH: 27.4 pg (ref 26.0–34.0)
MCHC: 32.3 g/dL (ref 30.0–36.0)
MCV: 84.8 fL (ref 80.0–100.0)
Platelets: 223 10*3/uL (ref 150–400)
RBC: 5 MIL/uL (ref 3.87–5.11)
RDW: 15.5 % (ref 11.5–15.5)
WBC: 6.9 10*3/uL (ref 4.0–10.5)
nRBC: 0 % (ref 0.0–0.2)

## 2022-09-21 LAB — COMPREHENSIVE METABOLIC PANEL
ALT: 12 U/L (ref 0–44)
AST: 20 U/L (ref 15–41)
Albumin: 4 g/dL (ref 3.5–5.0)
Alkaline Phosphatase: 57 U/L (ref 38–126)
Anion gap: 19 — ABNORMAL HIGH (ref 5–15)
BUN: 6 mg/dL (ref 6–20)
CO2: 20 mmol/L — ABNORMAL LOW (ref 22–32)
Calcium: 9.6 mg/dL (ref 8.9–10.3)
Chloride: 99 mmol/L (ref 98–111)
Creatinine, Ser: 0.94 mg/dL (ref 0.44–1.00)
GFR, Estimated: >60 mL/min (ref >60)
Glucose, Bld: 90 mg/dL (ref 70–99)
Potassium: 2.6 mmol/L — CL (ref 3.5–5.1)
Sodium: 138 mmol/L (ref 135–145)
Total Bilirubin: 0.6 mg/dL (ref 0.3–1.2)
Total Protein: 6.7 g/dL (ref 6.5–8.1)

## 2022-09-21 LAB — PROTIME-INR
INR: 1 (ref 0.8–1.2)
Prothrombin Time: 13.7 seconds (ref 11.4–15.2)

## 2022-09-21 LAB — SAMPLE TO BLOOD BANK

## 2022-09-21 LAB — TROPONIN I (HIGH SENSITIVITY): Troponin I (High Sensitivity): 4 ng/L (ref <18)

## 2022-09-21 MED ORDER — IOHEXOL 350 MG/ML SOLN
75.0000 mL | Freq: Once | INTRAVENOUS | Status: AC | PRN
  Administered 2022-09-21: 75 mL via INTRAVENOUS

## 2022-09-21 MED ORDER — POTASSIUM CHLORIDE CRYS ER 20 MEQ PO TBCR
40.0000 meq | EXTENDED_RELEASE_TABLET | Freq: Once | ORAL | Status: AC
  Administered 2022-09-21: 40 meq via ORAL
  Filled 2022-09-21: qty 2

## 2022-09-21 MED ORDER — FENTANYL CITRATE PF 50 MCG/ML IJ SOSY
50.0000 ug | PREFILLED_SYRINGE | Freq: Once | INTRAMUSCULAR | Status: AC
  Administered 2022-09-21: 50 ug via INTRAVENOUS
  Filled 2022-09-21: qty 1

## 2022-09-21 NOTE — ED Notes (Signed)
Pt states that her ride is here and she is ready to leave. EDP made aware, IV removed.

## 2022-09-21 NOTE — ED Notes (Signed)
Pt given water for PO challenge 

## 2022-09-21 NOTE — ED Notes (Signed)
Pt ambulated in hallway without assistance

## 2022-09-21 NOTE — ED Notes (Signed)
Phlebotomy at bedside.

## 2022-09-21 NOTE — ED Notes (Signed)
Pt educated on the importance of waiting for scans to result before patient sits up. Pt also reports increase in generalized pain. MD notified.
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
## 2022-09-21 NOTE — ED Notes (Signed)
Date and time results received: 09/21/22 0137 (use smartphrase ".now" to insert current time)  Test: potassium Critical Value: 2.6  Name of Provider Notified: Peterson Lombard, MD  Orders Received? Or Actions Taken?:  n/a

## 2023-02-27 ENCOUNTER — Other Ambulatory Visit (HOSPITAL_COMMUNITY): Payer: Self-pay

## 2023-02-27 MED ORDER — ERGOCALCIFEROL 1.25 MG (50000 UT) PO CAPS
50000.0000 [IU] | ORAL_CAPSULE | ORAL | 5 refills | Status: AC
  Filled 2023-02-27: qty 4, 28d supply, fill #0

## 2023-02-27 MED ORDER — GABAPENTIN 300 MG PO CAPS
300.0000 mg | ORAL_CAPSULE | Freq: Three times a day (TID) | ORAL | 0 refills | Status: AC
  Filled 2023-02-27: qty 90, 30d supply, fill #0

## 2023-02-27 MED ORDER — OXYCODONE HCL 15 MG PO TABS
15.0000 mg | ORAL_TABLET | ORAL | 0 refills | Status: AC | PRN
  Filled 2023-02-27 – 2023-02-28 (×2): qty 180, 30d supply, fill #0

## 2023-02-27 MED ORDER — WEGOVY 2.4 MG/0.75ML ~~LOC~~ SOAJ: 2.4000 mg | SUBCUTANEOUS | 0 refills | Status: AC

## 2023-02-28 ENCOUNTER — Other Ambulatory Visit (HOSPITAL_COMMUNITY): Payer: Self-pay

## 2023-03-08 ENCOUNTER — Other Ambulatory Visit (HOSPITAL_COMMUNITY): Payer: Self-pay

## 2023-03-24 ENCOUNTER — Other Ambulatory Visit (HOSPITAL_COMMUNITY): Payer: Self-pay

## 2023-10-13 ENCOUNTER — Other Ambulatory Visit: Payer: Self-pay

## 2023-10-13 DIAGNOSIS — G43109 Migraine with aura, not intractable, without status migrainosus: Secondary | ICD-10-CM

## 2024-02-14 ENCOUNTER — Emergency Department (HOSPITAL_COMMUNITY)
Admission: EM | Admit: 2024-02-14 | Discharge: 2024-02-14 | Disposition: A | Discharge status: Home / Self Care | Payer: MEDICAID

## 2024-02-14 ENCOUNTER — Emergency Department (HOSPITAL_COMMUNITY): Payer: MEDICAID

## 2024-02-14 ENCOUNTER — Other Ambulatory Visit: Payer: Self-pay

## 2024-02-14 DIAGNOSIS — R4781 Slurred speech: Secondary | ICD-10-CM

## 2024-02-14 DIAGNOSIS — G459 Transient cerebral ischemic attack, unspecified: Secondary | ICD-10-CM

## 2024-02-14 DIAGNOSIS — R531 Weakness: Secondary | ICD-10-CM

## 2024-02-14 DIAGNOSIS — H53461 Homonymous bilateral field defects, right side: Secondary | ICD-10-CM

## 2024-02-14 DIAGNOSIS — Z91141 Patient's other noncompliance with medication regimen due to financial hardship: Secondary | ICD-10-CM

## 2024-02-14 DIAGNOSIS — D6859 Other primary thrombophilia: Secondary | ICD-10-CM

## 2024-02-14 DIAGNOSIS — Z8673 Personal history of transient ischemic attack (TIA), and cerebral infarction without residual deficits: Secondary | ICD-10-CM

## 2024-02-14 DIAGNOSIS — R519 Headache, unspecified: Secondary | ICD-10-CM

## 2024-02-14 LAB — CBC
HCT: 32.5 % — ABNORMAL LOW (ref 36.0–46.0)
Hemoglobin: 10.8 g/dL — ABNORMAL LOW (ref 12.0–15.0)
MCH: 29.3 pg (ref 26.0–34.0)
MCHC: 33.2 g/dL (ref 30.0–36.0)
MCV: 88.1 fL (ref 80.0–100.0)
Platelets: 198 K/uL (ref 150–400)
RBC: 3.69 MIL/uL — ABNORMAL LOW (ref 3.87–5.11)
RDW: 12.6 % (ref 11.5–15.5)
WBC: 7.6 K/uL (ref 4.0–10.5)
nRBC: 0 % (ref 0.0–0.2)

## 2024-02-14 LAB — I-STAT CHEM 8, ED
BUN: 9 mg/dL (ref 6–20)
Calcium, Ion: 1.08 mmol/L — ABNORMAL LOW (ref 1.15–1.40)
Chloride: 97 mmol/L — ABNORMAL LOW (ref 98–111)
Creatinine, Ser: 0.6 mg/dL (ref 0.44–1.00)
Glucose, Bld: 108 mg/dL — ABNORMAL HIGH (ref 70–99)
HCT: 31 % — ABNORMAL LOW (ref 36.0–46.0)
Hemoglobin: 10.5 g/dL — ABNORMAL LOW (ref 12.0–15.0)
Potassium: 3.3 mmol/L — ABNORMAL LOW (ref 3.5–5.1)
Sodium: 139 mmol/L (ref 135–145)
TCO2: 30 mmol/L (ref 22–32)

## 2024-02-14 LAB — COMPREHENSIVE METABOLIC PANEL WITH GFR
ALT: 9 U/L (ref 0–44)
AST: 15 U/L (ref 15–41)
Albumin: 3.3 g/dL — ABNORMAL LOW (ref 3.5–5.0)
Alkaline Phosphatase: 58 U/L (ref 38–126)
Anion gap: 11 (ref 5–15)
BUN: 8 mg/dL (ref 6–20)
CO2: 28 mmol/L (ref 22–32)
Calcium: 8.5 mg/dL — ABNORMAL LOW (ref 8.9–10.3)
Chloride: 98 mmol/L (ref 98–111)
Creatinine, Ser: 0.59 mg/dL (ref 0.44–1.00)
GFR, Estimated: >60 mL/min (ref >60)
Glucose, Bld: 108 mg/dL — ABNORMAL HIGH (ref 70–99)
Potassium: 3.3 mmol/L — ABNORMAL LOW (ref 3.5–5.1)
Sodium: 137 mmol/L (ref 135–145)
Total Bilirubin: 0.9 mg/dL (ref 0.0–1.2)
Total Protein: 6.5 g/dL (ref 6.5–8.1)

## 2024-02-14 LAB — APTT: aPTT: 23 s — ABNORMAL LOW (ref 24–36)

## 2024-02-14 LAB — DIFFERENTIAL
Abs Immature Granulocytes: 0.03 K/uL (ref 0.00–0.07)
Basophils Absolute: 0 K/uL (ref 0.0–0.1)
Basophils Relative: 0 %
Eosinophils Absolute: 0.1 K/uL (ref 0.0–0.5)
Eosinophils Relative: 2 %
Immature Granulocytes: 0 %
Lymphocytes Relative: 22 %
Lymphs Abs: 1.7 K/uL (ref 0.7–4.0)
Monocytes Absolute: 0.3 K/uL (ref 0.1–1.0)
Monocytes Relative: 4 %
Neutro Abs: 5.4 K/uL (ref 1.7–7.7)
Neutrophils Relative %: 72 %

## 2024-02-14 LAB — PROTIME-INR
INR: 1 (ref 0.8–1.2)
Prothrombin Time: 14.2 s (ref 11.4–15.2)

## 2024-02-14 LAB — CBG MONITORING, ED: Glucose-Capillary: 108 mg/dL — ABNORMAL HIGH (ref 70–99)

## 2024-02-14 LAB — ETHANOL: Alcohol, Ethyl (B): <15 mg/dL (ref <15)

## 2024-02-14 MED ORDER — LACTATED RINGERS IV BOLUS
1000.0000 mL | Freq: Once | INTRAVENOUS | Status: AC
  Administered 2024-02-14: 1000 mL via INTRAVENOUS

## 2024-02-14 MED ORDER — IOHEXOL 350 MG/ML SOLN
75.0000 mL | Freq: Once | INTRAVENOUS | Status: AC | PRN
  Administered 2024-02-14: 75 mL via INTRAVENOUS

## 2024-02-14 MED ORDER — POTASSIUM CHLORIDE CRYS ER 20 MEQ PO TBCR
40.0000 meq | EXTENDED_RELEASE_TABLET | Freq: Once | ORAL | Status: AC
  Administered 2024-02-14: 40 meq via ORAL
  Filled 2024-02-14: qty 2

## 2024-02-14 MED ORDER — IOHEXOL 350 MG/ML SOLN
100.0000 mL | Freq: Once | INTRAVENOUS | Status: AC | PRN
  Administered 2024-02-14: 100 mL via INTRAVENOUS

## 2024-02-14 MED ORDER — SODIUM CHLORIDE 0.9% FLUSH
3.0000 mL | Freq: Once | INTRAVENOUS | Status: AC
  Administered 2024-02-14: 3 mL via INTRAVENOUS

## 2024-02-14 MED ORDER — PROCHLORPERAZINE EDISYLATE 10 MG/2ML IJ SOLN: 10.0000 mg | Freq: Once | INTRAMUSCULAR | Status: AC

## 2024-02-14 MED ORDER — PROCHLORPERAZINE EDISYLATE 10 MG/2ML IJ SOLN
INTRAMUSCULAR | Status: AC
  Administered 2024-02-14: 10 mg via INTRAVENOUS
  Filled 2024-02-14: qty 2

## 2024-02-14 MED ORDER — MAGNESIUM SULFATE 2 GM/50ML IV SOLN
2.0000 g | Freq: Once | INTRAVENOUS | Status: AC
  Administered 2024-02-14: 2 g via INTRAVENOUS
  Filled 2024-02-14: qty 50

## 2024-02-14 NOTE — Consult Note (Addendum)
 NEUROLOGY CONSULT NOTE   Date of service: February 14, 2024 Patient Name: Victoria Alvarez MRN:  [id number] DOB:  11-16-1990 Chief Complaint: Right sided weakness, slurred speech, right sided vision loss and severe headache Requesting Provider: Bari Charmaine FALCON, MD  History of Present Illness  Victoria Alvarez is a 33 y.o. female with a PMHx of anemia, asthma, cerebral venous sinus thrombosis, chronic pelvic pain, depression, headache, primary hypercoagulable state (on BID Lovenox  injections, but states noncompliant x 2 months due to financial issues, with Pharmacy noting that she has not refilled x 6 months), miscarriage, opioid dependence, portal vein thrombosis, pre-eclampsia, PE, seizures and stoke in 2016 with right sided weakness that fully resolved with PT who presents via EMS to the ED from home as a Code Stroke after acute onset of right sided numbness, weakness, dysarthria and severe bifrontal headache. She woke up at 5 AM with the severe headache and then noticed the symptoms.   LKW: Midnight Modified rankin score: 1-No significant post stroke disability and can perform usual duties with stroke symptoms IV Thrombolysis: Wake up stroke MRI protocol is negative for DWI abnormality and outside of the time window relative to time last seen normal.  EVT: No: CTA is negative for LVO.     NIHSS components Score: Comment  1a Level of Conscious 0[]  1[x]  2[]  3[]      1b LOC Questions 0[]  1[x]  2[]      Got her age wrong  1c LOC Commands 0[x]  1[]  2[]       2 Best Gaze 0[x]  1[]  2[]       3 Visual 0[]  1[]  2[x]  3[]     Right homonymous hemianopsia  4 Facial Palsy 0[x]  1[]  2[]  3[]      5a Motor Arm - left 0[x]  1[]  2[]  3[]  4[]  UN[]    5b Motor Arm - Right 0[]  1[]  2[]  3[x]  4[]  UN[]    6a Motor Leg - Left 0[x]  1[]  2[]  3[]  4[]  UN[]    6b Motor Leg - Right 0[]  1[]  2[x]  3[]  4[]  UN[]    7 Limb Ataxia 0[x]  1[]  2[]  UN[]      8 Sensory 0[]  1[x]  2[]  UN[]     Decreased to RUE  9 Best Language 0[x]  1[]  2[]  3[]      10  Dysarthria 0[x]  1[]  2[]  UN[]      11 Extinct. and Inattention 0[x]  1[]  2[]       TOTAL:   10      ROS  Comprehensive ROS performed and pertinent positives documented in HPI    Past History   Past Medical History:  Diagnosis Date   Anemia 07/2014   Microcytic   Asthma    Cerebral vein thrombosis 06/12/2014   Cerebral venous sinus thrombosis    Chronic pelvic pain in female    Depression 2007   Headache(784.0)    Hypercoagulable state, primary 07/2014   . no clear inherited or acquired cause for the patient's coagulopathy   Interstitial cystitis    pain from this was reason for starting opioids age 68.    Miscarriage 2005   Morbid obesity (HCC)    BMI 37 in 11/2014.   Non-compliance    with anticoagulant   Numbness    right side   Opioid dependence (HCC)    to Rx opioids.  switched to Methadone  after second child born in 04/2014   Pneumatosis of intestines 10/2014   Pneumonia 03/2014   Portal vein thrombosis 10/11/14   Preeclampsia 2012   Pulmonary embolism (HCC)    Pyelonephritis affecting pregnancy in first  trimester July 2015   Seizures [facility])    Stroke [facility]) 05/2014   presented with right sided weakness.    Past Surgical History:  Procedure Laterality Date   ABDOMINAL SURGERY     CESAREAN SECTION N/A 05/06/2014   Procedure: CESAREAN SECTION;  Surgeon: Lang JINNY Peel, DO;  Location: [facility] ORS;  Service: Obstetrics;  Laterality: N/A;   COLONOSCOPY WITH PROPOFOL  N/A 11/18/2014   Procedure: COLONOSCOPY WITH PROPOFOL ;  Surgeon: Toribio SHAUNNA Cedar, MD;  Location: [facility] ENDOSCOPY;  Service: Endoscopy;  Laterality: N/A;   INGUINAL HERNIA REPAIR Left 2008   VAGINA SURGERY  ~ 2011   benign vaginal tumor removed at [facility].     Family History: Family History  Problem Relation Age of Onset   Diabetes Mother    Heart disease Mother    Anxiety disorder Mother    Depression Mother    Hypertension Father    CAD Father    Diabetes Maternal Grandmother    Hypertension Maternal Grandmother     Stroke Maternal Grandmother    Hypothyroidism Maternal Grandmother    Diabetes Maternal Grandfather    Hypertension Maternal Grandfather    Diabetes Paternal Grandmother    Diabetes Paternal Grandfather    Hypertension Paternal Grandfather     Social History  reports that she has been smoking cigarettes. She has a 2 pack-year smoking history. She has never used smokeless tobacco. She reports that she does not drink alcohol and does not use drugs.  Allergies  Allergen Reactions   Bee Venom Swelling    Worse reaction if stung from the neck up Worse reaction if stung from the neck up   Ciprofloxacin Diarrhea and Nausea And Vomiting    Medications   Current Facility-Administered Medications:    sodium chloride  flush (NS) 0.9 % injection 3 mL, 3 mL, Intravenous, Once, Horton, Charmaine FALCON, MD  Current Outpatient Medications:    ARIPiprazole (ABILIFY) 10 MG tablet, Take 10 mg by mouth every morning., Disp: , Rfl:    enoxaparin  (LOVENOX ) 80 MG/0.8ML injection, Inject into the skin., Disp: , Rfl:    ergocalciferol  (VITAMIN D2) 1.25 MG (50000 UT) capsule, Take 1 capsule (50,000 Units total) by mouth once a week., Disp: 4 capsule, Rfl: 5   gabapentin  (NEURONTIN ) 300 MG capsule, Take 1 capsule (300 mg total) by mouth 3 (three) times daily., Disp: 90 capsule, Rfl: 11   gabapentin  (NEURONTIN ) 300 MG capsule, Take 1 capsule (300 mg total) by mouth 3 (three) times daily., Disp: 90 capsule, Rfl: 0   inFLIXimab  (REMICADE  IV), Inject into the vein., Disp: , Rfl:    ondansetron  (ZOFRAN ) 4 MG tablet, Take 4 mg by mouth every 6 (six) hours as needed for nausea or vomiting., Disp: , Rfl:    oxyCODONE  (ROXICODONE ) 15 MG immediate release tablet, Take 15 mg by mouth every 4 (four) hours as needed for pain., Disp: , Rfl:    oxyCODONE  (ROXICODONE ) 15 MG immediate release tablet, Take 1 tablet (15 mg total) by mouth every 4 (four) hours as needed., Disp: 180 tablet, Rfl: 0   Rimegepant Sulfate (NURTEC) 75  MG TBDP, Take 75 mg by mouth daily as needed (take for abortive therapy of migraine, no more than 1 tablet in 24 hours or 10 per month)., Disp: 8 tablet, Rfl: 11   Semaglutide -Weight Management (WEGOVY ) 2.4 MG/0.75ML SOAJ, Inject 2.4 mg into the skin once a week., Disp: 2 mL, Rfl: 0   UNABLE TO FIND, Med Name: Emergen-c Gummies OTC, Disp: , Rfl:  Vitals   There were no vitals filed for this visit.  There is no height or weight on file to calculate BMI. Vitals:   02/14/24 0700  BP: (!) 164/95  Pulse: (!) 59  Resp: 14  SpO2: 100%   Physical Exam   Constitutional: Appears well-developed and well-nourished.  Psych: Anxious affect is appropriate to situation. Agitated.  Eyes: No scleral injection.  HENT: No OP obstruction.  Head: Normocephalic.  Respiratory: Effort normal, non-labored breathing.  Skin: WDI. Some petechiae are noted to face and neck  Neurologic Examination   See NIHSS  Labs/Imaging/Neurodiagnostic studies     Lipid Panel:  Lab Results  Component Value Date   LDLCALC 130 (H) 03/25/2016   INR  Lab Results  Component Value Date   INR 1.0 09/21/2022   APTT  Lab Results  Component Value Date   APTT 39 (H) 03/12/2020   AED levels:  Lab Results  Component Value Date   LEVETIRACETA None Detected 11/03/2014    ASSESSMENT  Rocky Stepien is a 33 y.o. female with a PMHx of cerebral venous sinus thrombosis, migraine headaches, primary hypercoagulable state (on BID Lovenox  injections, but states noncompliant x 2 months due to financial issues, with Pharmacy noting that she has not refilled x 6 months), miscarriage, opioid dependence, portal vein thrombosis, pre-eclampsia, PE, seizures and stoke in 2016 with right sided weakness who presents with acute onset of RUE > RLE weakness, slurred speech and right homonymous hemianopsia as well as severe bifrontal headache. Wake up stroke with LKN at midnight.  Also has a history of migraines.  - Exam reveals right sided  deficits that are best referable to an acute lesion involving the right frontal and parietal lobes, possibly also the right occipital lobe. A large right thalamic ischemic infarction is also possible.  - CT head: No acute intracranial abnormality. Extensive mucosal disease within the ethmoid and maxillary sinuses, status post sinonasal surgery. - CTA of head and neck with CTP: Negative for LVO or perfusion deficit on preliminary reviews by Neurology and Neuroradiology - CTV: Negative for venous sinus thrombosis on preliminary reviews by Neurology and Neuroradiology - Impression:  - DDx for her presentation includes complicated migraine and acute stroke. Will obtain MRI brain to assess for possible DWI/FLAIR mismatch - Not a TNK candidate based on conventional time-based criteria as LKN was > 4.5 hours ago. However, she may be a candidate for TNK based on wake-up stroke protocol given that she awoke with symptoms < 4.5 hours ago.  - Seizure in 2016 after her stroke. Was initially on Keppra  which was later discontinued. No seizures since then.   RECOMMENDATIONS  - Compazine  10 mg IV x 1 (ordered)  - STAT MRI brain (ordered) - Further recommendations following MRI brain. May be a candidate for TNK up until 9:30 AM based on wake-up stroke protocol, if FLAIR/DWI mismatch is seen on MRI.  - Permissive HTN for now  Addendum: - Preliminary review of MRI reveals no DWI abnormality.  - Not a wake-up stroke TNK candidate due to negative MRI.  - Restart therapeutic Lovenox .   - Management of her severe headache with medications - Most likely etiology for her presentation given negative MRI is either TIA or complicated migraine. An unwitnessed seizure during sleep followed by postictal Todd's paralysis is also possible.  - EEG (ordered).  - Stroke Team to follow given her history of hypercoagulable state and prior stroke as well as noncompliance with Lovenox .  I personally spent a total  of 80 minutes in  the care of the patient today including getting/reviewing separately obtained history, performing a medically appropriate exam/evaluation, placing orders, referring and communicating with other health care professionals, documenting clinical information in the EHR, independently interpreting results, communicating results, and coordinating care.  ______________________________________________________________________    Bonney SHARK, Shreya Lacasse, MD [facility]

## 2024-02-14 NOTE — Code Documentation (Signed)
 Responded to Code Stroke called at [facility] for R sided weakness and R hemianopia.[id number]. Pt arrived at 0635, CBG-108, NIH-10, CT head negative for acute changes, CTA-no LVO. Plan: MRI for wake-up stroke protocol.

## 2024-02-14 NOTE — ED Provider Notes (Cosign Needed Addendum)
 [facility] AT [facility] Provider Note   CSN: [id number] Arrival date & time: 02/14/24  9364  An emergency department physician performed an initial assessment on this suspected stroke patient at [phone].  Patient presents with: Code Stroke   Victoria Alvarez is a 33 y.o. female.   HPI  Patient is a 33 year old female with past medical history significant for CVA, dural venous thrombosis, migraines follows with [facility] she presents emergency room today with complaints of wake-up stroke deficits of right sided numbness, weakness, dysarthria and severe bifrontal headache.  Patient woke up at 5 AM after going to sleep at approximately midnight.  9:35 AM  Patient indicates to me that she is feeling much improved is now moving arms and legs although significant weakness in right upper extremity persists.    Prior to Admission medications   Medication Sig Start Date End Date Taking? Authorizing Provider  ARIPiprazole (ABILIFY) 10 MG tablet Take 10 mg by mouth every morning. 06/03/22   [provider]  enoxaparin  (LOVENOX ) 80 MG/0.8ML injection Inject into the skin. 02/01/19   [provider]  ergocalciferol  (VITAMIN D2) 1.25 MG (50000 UT) capsule Take 1 capsule (50,000 Units total) by mouth once a week. 02/27/23     gabapentin  (NEURONTIN ) 300 MG capsule Take 1 capsule (300 mg total) by mouth 3 (three) times daily. 09/19/21   Lomax, Amy, NP  gabapentin  (NEURONTIN ) 300 MG capsule Take 1 capsule (300 mg total) by mouth 3 (three) times daily. 02/27/23     inFLIXimab  (REMICADE  IV) Inject into the vein.    [provider]  ondansetron  (ZOFRAN ) 4 MG tablet Take 4 mg by mouth every 6 (six) hours as needed for nausea or vomiting.    [provider]  oxyCODONE  (ROXICODONE ) 15 MG immediate release tablet Take 15 mg by mouth every 4 (four) hours as needed for pain.    [provider]  oxyCODONE  (ROXICODONE ) 15 MG immediate  release tablet Take 1 tablet (15 mg total) by mouth every 4 (four) hours as needed. 02/27/23     Rimegepant Sulfate (NURTEC) 75 MG TBDP Take 75 mg by mouth daily as needed (take for abortive therapy of migraine, no more than 1 tablet in 24 hours or 10 per month). 09/19/21   Lomax, Amy, NP  Semaglutide -Weight Management (WEGOVY ) 2.4 MG/0.75ML SOAJ Inject 2.4 mg into the skin once a week. 02/27/23     UNABLE TO FIND Med Name: Emergen-c Gummies OTC    [provider]    Allergies: Bee venom and Ciprofloxacin    Review of Systems  Updated Vital Signs BP (!) 141/82   Pulse 69   Resp 19   Ht 5' 2 (1.575 m)   Wt 80.6 kg   SpO2 100%   BMI 32.50 kg/m    Initial physical exam consistent with neurology note.  Physical Exam Vitals and nursing note reviewed.  Constitutional:      General: She is not in acute distress. HENT:     Head: Normocephalic and atraumatic.     Nose: Nose normal.  Eyes:     General: No scleral icterus. Cardiovascular:     Rate and Rhythm: Normal rate and regular rhythm.     Pulses: Normal pulses.     Heart sounds: Normal heart sounds.  Pulmonary:     Effort: Pulmonary effort is normal. No respiratory distress.     Breath sounds: No wheezing.  Abdominal:     Palpations: Abdomen is soft.  Tenderness: There is no abdominal tenderness.  Musculoskeletal:     Cervical back: Normal range of motion.     Right lower leg: No edema.     Left lower leg: No edema.  Skin:    General: Skin is warm and dry.     Capillary Refill: Capillary refill takes less than 2 seconds.  Neurological:     Mental Status: She is alert. Mental status is at baseline.     Comments: Patient with poor effort and right upper extremity on movement. Otherwise extremities with normal strength.  Sensation normal throughout.  Psychiatric:        Mood and Affect: Mood normal.        Behavior: Behavior normal.     (all labs ordered are listed, but only abnormal results are  displayed) Labs Reviewed  CBC - Abnormal; Notable for the following components:      Result Value   RBC 3.69 (*)    Hemoglobin 10.8 (*)    HCT 32.5 (*)    All other components within normal limits  COMPREHENSIVE METABOLIC PANEL WITH GFR - Abnormal; Notable for the following components:   Potassium 3.3 (*)    Glucose, Bld 108 (*)    Calcium  8.5 (*)    Albumin 3.3 (*)    All other components within normal limits  APTT - Abnormal; Notable for the following components:   aPTT 23 (*)    All other components within normal limits  I-STAT CHEM 8, ED - Abnormal; Notable for the following components:   Potassium 3.3 (*)    Chloride 97 (*)    Glucose, Bld 108 (*)    Calcium , Ion 1.08 (*)    Hemoglobin 10.5 (*)    HCT 31.0 (*)    All other components within normal limits  CBG MONITORING, ED - Abnormal; Notable for the following components:   Glucose-Capillary 108 (*)    All other components within normal limits  DIFFERENTIAL  ETHANOL  PROTIME-INR  HCG, SERUM, QUALITATIVE  RAPID URINE DRUG SCREEN, HOSP PERFORMED    EKG: None  Radiology: MR BRAIN WO CONTRAST Result Date: 02/14/2024 EXAM: MRI BRAIN WITHOUT CONTRAST 02/14/2024 08:05:57 AM TECHNIQUE: Multiplanar multisequence MRI of the head/brain was performed without the administration of intravenous contrast. COMPARISON: MRI of the head dated 10/12/2017. CLINICAL HISTORY: Neuro deficit, acute, stroke suspected. FINDINGS: BRAIN AND VENTRICLES: No acute infarct. No intracranial hemorrhage. No mass. No midline shift. No hydrocephalus. The sella is unremarkable. Normal flow voids. ORBITS: No acute abnormality. SINUSES AND MASTOIDS: Moderate opacification of the ethmoid air cells and maxillary sinuses bilaterally. There is a nasal septal defect. BONES AND SOFT TISSUES: Normal marrow signal. No acute soft tissue abnormality. IMPRESSION: 1. No acute intracranial abnormality. 2. Moderate opacification of the ethmoid air cells and maxillary sinuses  bilaterally. 3. Nasal septal defect. Electronically signed by: Evalene Coho MD 02/14/2024 08:39 AM EST RP Workstation: [id number]   CT VENOGRAM HEAD Result Date: 02/14/2024 EXAM: CT VENOGRAM WITH CONTRAST 02/14/2024 07:35:11 AM TECHNIQUE: CT venogram of the head/brain was performed with the administration of intravenous contrast. 75 mL of iohexol  (OMNIPAQUE ) 350 MG/ML injection was administered. Multiplanar reformatted images are provided for review. MIP images are provided for review. Automated exposure control, iterative reconstruction, and/or weight based adjustment of the mA/kV was utilized to reduce the radiation dose to as low as reasonably achievable. COMPARISON: CT venogram of the head dated 03/12/2020. CLINICAL HISTORY: Dural venous sinus thrombosis suspected. FINDINGS: BRAIN/VENTRICLES: No acute intracranial hemorrhage. No  extra axial fluid collection. Gray-white differentiation is maintained. No mass effect or midline shift. No hydrocephalus. ORBITS: No acute abnormality. SINUSES AND MASTOIDS: No acute abnormality. SOFT TISSUES AND SKULL: No acute abnormality. CT VENOGRAM: The transverse sinuses are again noted to be diminutive bilaterally, but patent. No dural venous sinus thrombosis. No significant stenosis. The above findings were discussed with Dr. Lindzen at 07:29 am 02/14/2024. IMPRESSION: 1. No acute intracranial abnormality. 2. No dural venous sinus thrombosis. 3. Diminutive but patent transverse sinuses bilaterally, stable compared to prior study. 4. Findings discussed with Dr. Lindzen at 07:29 am on 02/14/2024. Electronically signed by: Evalene Coho MD 02/14/2024 07:57 AM EST RP Workstation: [id number]   CT ANGIO HEAD NECK W WO CM W PERF (CODE STROKE) Result Date: 02/14/2024 EXAM: CT BRAIN PERFUSION 02/14/2024 07:34:23 AM TECHNIQUE: Cerebral perfusion analysis using computed tomography with contrast administration, including post-processing of parametric maps with determination of  cerebral blood flow, cerebral blood volume, mean transit time and time-to-maximum. Automated exposure control, iterative reconstruction, and/or weight based adjustment of the mA/kV was utilized to reduce the radiation dose to as low as reasonably achievable. The study was performed with and without intravenous contrast. COMPARISON: None available. CLINICAL HISTORY: Neuro deficit, acute, stroke suspected FINDINGS: There is extensive paranasal sinus disease within the ethmoid air cells and maxillary sinuses and there are extensive postsurgical changes present. There is mild diffuse cervical lymphadenopathy bilaterally. CT PERFUSION: EXAM QUALITY: The examination is adequate with diagnostic perfusion maps. No significant motion artifact. Appropriate arterial inflow and venous outflow curves. CORE INFARCT (CBF<30% volume): 0 mL TOTAL HYPOPERFUSION (Tmax>6s volume): 0 mL PENUMBRA: Mismatch volume: 0 mL Mismatch ratio: Not applicable Location: Not applicable Note: The above findings were discussed with Dr. Lindzen at 07:29 am 02/14/24. IMPRESSION: 1. No evidence of ischemia by CT brain perfusion. Electronically signed by: Evalene Coho MD 02/14/2024 07:49 AM EST RP Workstation: [id number]   CT HEAD CODE STROKE WO CONTRAST Result Date: 02/14/2024 EXAM: CT HEAD WITHOUT 02/14/2024 06:53:43 AM TECHNIQUE: CT of the head was performed without the administration of intravenous contrast. Automated exposure control, iterative reconstruction, and/or weight based adjustment of the mA/kV was utilized to reduce the radiation dose to as low as reasonably achievable. COMPARISON: CT of the head dated 09/21/2022. CLINICAL HISTORY: Neuro deficit, acute, stroke suspected. FINDINGS: BRAIN AND VENTRICLES: No acute intracranial hemorrhage. No mass effect or midline shift. No extra-axial fluid collection. No evidence of acute infarct. No hydrocephalus. ORBITS: No acute abnormality. SINUSES AND MASTOIDS: Extensive mucosal disease within the  ethmoid and maxillary sinuses. Status post sinonasal surgery. SOFT TISSUES AND SKULL: No acute skull fracture. No acute soft tissue abnormality. [place] Stroke Program Early CT Score (ASPECTS) Ganglionic (caudate, IC, lentiform nucleus, insula, M1-M3): 7 Supraganglionic (M4-M6): 3 Total: 10 The above findings were communicated to Dr. Merrianne at 06:57 AM 02/14/2024. IMPRESSION: 1. No acute intracranial abnormality. 2. Extensive mucosal disease within the ethmoid and maxillary sinuses, status post sinonasal surgery. Electronically signed by: Evalene Coho MD 02/14/2024 07:00 AM EST RP Workstation: [id number]     .Critical Care  Performed by: Neldon Hamp RAMAN, PA Authorized by: Neldon Hamp RAMAN, PA   Critical care provider statement:    Critical care time (minutes):  35   Critical care time was exclusive of:  Separately billable procedures and treating other patients and teaching time   Critical care was necessary to treat or prevent imminent or life-threatening deterioration of the following conditions: Stroke/TIA.   Critical care was time spent personally by me  on the following activities:  Development of treatment plan with patient or surrogate, review of old charts, re-evaluation of patient's condition, pulse oximetry, ordering and review of radiographic studies, ordering and review of laboratory studies, ordering and performing treatments and interventions, obtaining history from patient or surrogate, examination of patient and evaluation of patient's response to treatment   Care discussed with: admitting provider      Medications Ordered in the ED  sodium chloride  flush (NS) 0.9 % injection 3 mL (3 mLs Intravenous Given 02/14/24 0730)  iohexol  (OMNIPAQUE ) 350 MG/ML injection 100 mL (100 mLs Intravenous Contrast Given 02/14/24 0713)  prochlorperazine  (COMPAZINE ) injection 10 mg (10 mg Intravenous Given 02/14/24 0742)  iohexol  (OMNIPAQUE ) 350 MG/ML injection 75 mL (75 mLs Intravenous Contrast  Given 02/14/24 0735)  potassium chloride  SA (KLOR-CON  M) CR tablet 40 mEq (40 mEq Oral Given 02/14/24 0855)  magnesium  sulfate IVPB 2 g 50 mL (0 g Intravenous Stopped 02/14/24 0920)  lactated ringers  bolus 1,000 mL (1,000 mLs Intravenous New Bag/Given 02/14/24 0855)                                    Medical Decision Making Amount and/or Complexity of Data Reviewed Labs: ordered. Radiology: ordered.  Risk Prescription drug management. Decision regarding hospitalization.   This patient presents to the ED for concern of headache, this involves a number of treatment options, and is a complaint that carries with it a moderate risk of complications and morbidity. A differential diagnosis was considered for the patient's symptoms which is discussed below:   Emergent considerations for headache include subarachnoid hemorrhage, meningitis, temporal arteritis, glaucoma, cerebral ischemia, carotid/vertebral dissection, intracranial tumor, Venous sinus thrombosis, carbon monoxide poisoning, acute or chronic subdural hemorrhage.  Other considerations include: Migraine, Cluster headache, Hypertension, Caffeine , alcohol, or drug withdrawal, Pseudotumor cerebri, Arteriovenous malformation, Head injury, Neurocysticercosis, Post-lumbar puncture, Preeclampsia, Tension headache, Sinusitis, Cervical arthritis, Refractive error causing strain, Dental abscess, Otitis media, Temporomandibular joint syndrome, Depression, Somatoform disorder (eg, somatization) Trigeminal neuralgia, Glossopharyngeal neuralgia.  Patient did come in as a code stroke with dense right-sided deficits and dysarthria.  Seems to have mostly resolved at this time apart from some residual right arm weakness.   Co morbidities: Discussed in HPI   Brief History:  Patient is a 33 year old female with past medical history significant for CVA, dural venous thrombosis, migraines follows with [facility] she presents emergency room today  with complaints of wake-up stroke deficits of right sided numbness, weakness, dysarthria and severe bifrontal headache.  Patient woke up at 5 AM after going to sleep at approximately midnight.  9:35 AM  Patient indicates to me that she is feeling much improved is now moving arms and legs although significant weakness in right upper extremity persists.     EMR reviewed including pt PMHx, past surgical history and past visits to ER.   See HPI for more details   Lab Tests:   I personally reviewed all laboratory work and imaging. Metabolic panel without any acute abnormality specifically kidney function within normal limits and no significant electrolyte abnormalities. CBC without leukocytosis or significant anemia. Mild hypokalemia repleted  Imaging Studies:  NAD. I personally reviewed all imaging studies and no acute abnormality found. I agree with radiology interpretation.    Cardiac Monitoring:  The patient was maintained on a cardiac monitor.  I personally viewed and interpreted the cardiac monitored which showed an underlying rhythm of: NSR EKG  non-ischemic   Medicines ordered:  I ordered medication including LR, magnesium , potassium, Compazine , IV fluids for headache Reevaluation of the patient after these medicines showed that the patient resolved I have reviewed the patients home medicines and have made adjustments as needed   Critical Interventions:     Consults/Attending Physician   I requested consultation with Dr. Merrianne of neurology,  and discussed lab and imaging findings as well as pertinent plan - they recommend: admit for TIA   I discussed this case with my attending physician who cosigned this note including patient's presenting symptoms, physical exam, and planned diagnostics and interventions. Attending physician stated agreement with plan or made changes to plan which were implemented.    Reevaluation:  After the interventions noted above I  re-evaluated patient and found that they have :resolved   Social Determinants of Health:      Problem List / ED Course:  Headache, transient right-sided hemiparesis, dysarthria now resolved.  Patient is alert and oriented x 4 has a normal neuroexam and is with husband who is at bedside who corroborates that she is at her mental status baseline she would like to be discharged home.  I discussed with her that this could lead to death, permanent disability, worsening of her long-term health.  She understands this is able to teach this back to me and states that she would like to be discharged home.  She will sign out AMA.   Dispostion:  After consideration of the diagnostic results and the patients response to treatment, I feel that the patent would benefit from admission   Patient wishes to leave AGAINST MEDICAL ADVICE. I personally explained need for further testing and my concerns for adverse outcomes if workup is incomplete. Specific concerns explained to patient include worsening symptoms, functional loss, long term sickness and death. Patient states understanding of risks and states they will return if they feel the need to at a later date to receive the recommended care or any other care at any time, regardless of their ability to pay for such care. Patient understands they are able to return at any time. Patient is able to explain back the risks of leaving AMA and still wishes to leave.   Specifically I recommend inpatient hospitalization to evaluate and exclude TIA, Todd's paralysis.  The patient is oriented to person, place, and time, has the capacity to make decisions regarding the medical care offered. The patient speaks coherently and exhibits no evidence of having an altered level of consciousness or alcohol or drug intoxication to a point that would impair judgment. They respond knowingly to questions about recommended treatment and alternate treatments including no further testing  or treatment; participate in diagnostic and treatment decisions by means of rational thought processes; and understand the items of minimum basic medical treatment information with respect to that treatment (the nature and seriousness of the illness, the nature of the treatment, the probable degree and duration of any benefits and risks of any medical intervention that is being recommended, and the consequences of lack of treatment, and the nature, risks, and benefits of any reasonable alternatives).  The patient understands the relevant information of the nature of their medical condition, as well as the risks, benefits, and treatment alternatives (including non-treatment), consequences of refusing care, and can competently communicate a rational explanation about their choice of care options.    Included in AVS was the following message:  You have chosen to leave AGAINST MEDICAL ADVICE. Should you change your mind,  you are always welcome and encouraged to return to the ED. You are encouraged to follow-up with, at the very least, a primary care provider, or other similar medical professional on this matter.   Final diagnoses:  TIA (transient ischemic attack)    ED Discharge Orders     None          Neldon Hamp RAMAN, [place] 02/14/24 1256    Neldon Hamp [place], [place] 02/14/24 1256    Bari Charmaine FALCON, MD 02/15/24 [phone]

## 2024-02-14 NOTE — ED Notes (Signed)
Pt transported to MRI on monitor.

## 2024-02-14 NOTE — ED Notes (Signed)
 0945 Patient expressing desire to leave, physician made aware.  [id number] Wylder, PA-C, at bedside explaining risks of leaving AMA. Patient is A&Ox4 at this time and continues to express desire to leave at this time despite knowledge of risk of injury or death from leaving at this time. Patient signing AMA e-form. PIV removed. Patient ambulatory with significant other to lobby with steady gait.

## 2024-02-14 NOTE — ED Triage Notes (Addendum)
 BIBEMS as code stroke. LKN 00:00. Pts boyfriend states she woke up at 5am with r side weakness/numbness, loss of vision in R eye, slurred speech, and worst headache of my life. BG 111. Hx stroke in 2016 and has blood clotting disorder. Pt states she is supposed to be on lovenox , but has not taken it in 2months. Upon arrival, pt somnolent. Pt became agitated during assessment by neurologist. Pt denies drug/alcohol use. Pt has dried secretions around mouth. NIH 10.

## 2024-02-14 NOTE — Hospital Course (Signed)
 46 woman with hypercoagulable state, off lovenox  Admit for TIA Saw by Lindzen with recs placed Awoke 5AM with R side deficits and dysarthria (LKW midnight) Has a headache Negative imaging, hx venous thrombosis On exam, R arm with motor deficit (couldn't list against gravity) Got mag and compazine  for headache, some potassium

## 2024-02-14 NOTE — Progress Notes (Signed)
 Met nightshift Rapid RN at bedside 0725. Patient taken to MRI. Plan: q2h NIH and VS. Permissive HTN. NPO until swallow eval. Handoff given to ED RN Donnice.

## 2024-02-14 NOTE — ED Notes (Addendum)
 Verbal order from Lindzen, MD to give 10mg  IV compazine
